# Patient Record
Sex: Female | Born: 1945
Health system: Southern US, Community
[De-identification: ages and names within clinical notes are randomized; demographics above are authoritative.]

## PROBLEM LIST (undated history)

## (undated) DIAGNOSIS — S2231XA Fracture of one rib, right side, initial encounter for closed fracture: Secondary | ICD-10-CM

## (undated) DIAGNOSIS — I509 Heart failure, unspecified: Secondary | ICD-10-CM

## (undated) DIAGNOSIS — R112 Nausea with vomiting, unspecified: Secondary | ICD-10-CM

## (undated) DIAGNOSIS — T8859XA Other complications of anesthesia, initial encounter: Secondary | ICD-10-CM

## (undated) DIAGNOSIS — M1990 Unspecified osteoarthritis, unspecified site: Secondary | ICD-10-CM

## (undated) DIAGNOSIS — H359 Unspecified retinal disorder: Secondary | ICD-10-CM

## (undated) DIAGNOSIS — R058 Other specified cough: Secondary | ICD-10-CM

## (undated) DIAGNOSIS — I712 Thoracic aortic aneurysm, without rupture, unspecified: Secondary | ICD-10-CM

## (undated) DIAGNOSIS — M810 Age-related osteoporosis without current pathological fracture: Secondary | ICD-10-CM

## (undated) DIAGNOSIS — R05 Cough: Secondary | ICD-10-CM

## (undated) DIAGNOSIS — I5032 Chronic diastolic (congestive) heart failure: Secondary | ICD-10-CM

## (undated) DIAGNOSIS — J449 Chronic obstructive pulmonary disease, unspecified: Secondary | ICD-10-CM

## (undated) DIAGNOSIS — T4145XA Adverse effect of unspecified anesthetic, initial encounter: Secondary | ICD-10-CM

## (undated) DIAGNOSIS — H3530 Unspecified macular degeneration: Secondary | ICD-10-CM

## (undated) DIAGNOSIS — C801 Malignant (primary) neoplasm, unspecified: Secondary | ICD-10-CM

## (undated) DIAGNOSIS — M26609 Unspecified temporomandibular joint disorder, unspecified side: Secondary | ICD-10-CM

## (undated) DIAGNOSIS — I5189 Other ill-defined heart diseases: Secondary | ICD-10-CM

## (undated) DIAGNOSIS — I75029 Atheroembolism of unspecified lower extremity: Secondary | ICD-10-CM

## (undated) DIAGNOSIS — M797 Fibromyalgia: Secondary | ICD-10-CM

## (undated) DIAGNOSIS — J9610 Chronic respiratory failure, unspecified whether with hypoxia or hypercapnia: Secondary | ICD-10-CM

## (undated) DIAGNOSIS — Z9889 Other specified postprocedural states: Secondary | ICD-10-CM

## (undated) DIAGNOSIS — Z973 Presence of spectacles and contact lenses: Secondary | ICD-10-CM

## (undated) HISTORY — PX: KIDNEY STONE SURGERY: SHX686

## (undated) HISTORY — PX: EYE SURGERY: SHX253

## (undated) HISTORY — PX: CATARACT EXTRACTION, BILATERAL: SHX1313

## (undated) HISTORY — DX: Atheroembolism of unspecified lower extremity: I75.029

## (undated) HISTORY — PX: PARTIAL HYSTERECTOMY: SHX80

## (undated) HISTORY — PX: OTHER SURGICAL HISTORY: SHX169

## (undated) HISTORY — DX: Age-related osteoporosis without current pathological fracture: M81.0

---

## 2006-03-21 HISTORY — PX: ANGIOPLASTY: SHX39

## 2007-06-23 ENCOUNTER — Emergency Department (HOSPITAL_COMMUNITY): Admission: EM | Admit: 2007-06-23 | Discharge: 2007-06-23 | Payer: Self-pay | Admitting: Emergency Medicine

## 2007-09-03 ENCOUNTER — Encounter: Admission: RE | Admit: 2007-09-03 | Discharge: 2007-11-01 | Payer: Self-pay | Admitting: Orthopaedic Surgery

## 2011-01-19 ENCOUNTER — Ambulatory Visit (HOSPITAL_COMMUNITY)
Admission: RE | Admit: 2011-01-19 | Discharge: 2011-01-19 | Disposition: A | Discharge status: Home / Self Care | Payer: Medicare Other | Source: Ambulatory Visit | Attending: Ophthalmology | Admitting: Ophthalmology

## 2011-01-19 ENCOUNTER — Encounter (HOSPITAL_COMMUNITY): Payer: Self-pay

## 2011-01-19 ENCOUNTER — Encounter (HOSPITAL_COMMUNITY)
Admission: RE | Admit: 2011-01-19 | Discharge: 2011-01-19 | Disposition: A | Discharge status: Home / Self Care | Payer: Medicare Other | Source: Ambulatory Visit | Attending: Ophthalmology | Admitting: Ophthalmology

## 2011-01-19 DIAGNOSIS — Z01812 Encounter for preprocedural laboratory examination: Secondary | ICD-10-CM | POA: Insufficient documentation

## 2011-01-19 DIAGNOSIS — Z01810 Encounter for preprocedural cardiovascular examination: Secondary | ICD-10-CM | POA: Insufficient documentation

## 2011-01-19 DIAGNOSIS — Z01818 Encounter for other preprocedural examination: Secondary | ICD-10-CM | POA: Insufficient documentation

## 2011-01-19 HISTORY — DX: Chronic obstructive pulmonary disease, unspecified: J44.9

## 2011-01-19 HISTORY — DX: Presence of spectacles and contact lenses: Z97.3

## 2011-01-19 HISTORY — DX: Unspecified macular degeneration: H35.30

## 2011-01-19 HISTORY — DX: Unspecified retinal disorder: H35.9

## 2011-01-19 HISTORY — DX: Other specified cough: R05.8

## 2011-01-19 HISTORY — DX: Fibromyalgia: M79.7

## 2011-01-19 HISTORY — DX: Unspecified osteoarthritis, unspecified site: M19.90

## 2011-01-19 HISTORY — DX: Cough: R05

## 2011-01-19 LAB — CBC
HCT: 42.6 % (ref 36.0–46.0)
Hemoglobin: 14 g/dL (ref 12.0–15.0)
MCH: 29.5 pg (ref 26.0–34.0)
MCHC: 32.9 g/dL (ref 30.0–36.0)
MCV: 89.9 fL (ref 78.0–100.0)
Platelets: 307 10*3/uL (ref 150–400)
RBC: 4.74 MIL/uL (ref 3.87–5.11)
RDW: 12.9 % (ref 11.5–15.5)
WBC: 9.9 10*3/uL (ref 4.0–10.5)

## 2011-01-19 LAB — BASIC METABOLIC PANEL
BUN: 7 mg/dL (ref 6–23)
CO2: 29 mEq/L (ref 19–32)
Calcium: 9.6 mg/dL (ref 8.4–10.5)
Chloride: 99 mEq/L (ref 96–112)
Creatinine, Ser: 0.61 mg/dL (ref 0.50–1.10)
GFR calc Af Amer: >90 mL/min (ref >90)
GFR calc non Af Amer: >90 mL/min (ref >90)
Glucose, Bld: 97 mg/dL (ref 70–99)
Potassium: 4.1 mEq/L (ref 3.5–5.1)
Sodium: 136 mEq/L (ref 135–145)

## 2011-01-19 LAB — SURGICAL PCR SCREEN
MRSA, PCR: NEGATIVE
Staphylococcus aureus: NEGATIVE

## 2011-01-19 NOTE — Pre-Procedure Instructions (Addendum)
19 Tina Bailey  01/19/2011   Your procedure is scheduled on:  January 26, 2011  Report to [facility] at 6:30 AM.  Call this number if you have problems the morning of surgery: [phone]   Remember:   Do not eat food:After Midnight.  Do not drink clear liquids: 4 Hours before arrival.  Take these medicines the morning of surgery with A SIP OF WATER: none   Do not wear jewelry, make-up or nail polish.  Do not wear lotions, powders, or perfumes. You may wear deodorant.  Do not shave 48 hours prior to surgery.  Do not bring valuables to the hospital.  Contacts, dentures or bridgework may not be worn into surgery.  Leave suitcase in the car. After surgery it may be brought to your room.  For patients admitted to the hospital, checkout time is 11:00 AM the day of discharge.   Patients discharged the day of surgery will not be allowed to drive home.  Name and phone number of your driver: Mardi Mainland [phone]  Special Instructions: CHG Shower Use Special Wash: 1/2 bottle night before surgery and 1/2 bottle morning of surgery.   Please read over the following fact sheets that you were given: Pain Booklet, Coughing and Deep Breathing and Surgical Site Infection Prevention

## 2011-01-26 ENCOUNTER — Encounter (HOSPITAL_COMMUNITY)
Admission: RE | Disposition: A | Discharge status: Home / Self Care | Payer: Self-pay | Source: Ambulatory Visit | Attending: Ophthalmology

## 2011-01-26 ENCOUNTER — Ambulatory Visit (HOSPITAL_COMMUNITY): Payer: Medicare Other | Admitting: Anesthesiology

## 2011-01-26 ENCOUNTER — Encounter (HOSPITAL_COMMUNITY): Payer: Self-pay | Admitting: Anesthesiology

## 2011-01-26 ENCOUNTER — Encounter (HOSPITAL_COMMUNITY): Payer: Self-pay | Admitting: *Deleted

## 2011-01-26 ENCOUNTER — Ambulatory Visit (HOSPITAL_COMMUNITY)
Admission: RE | Admit: 2011-01-26 | Discharge: 2011-01-26 | Disposition: A | Discharge status: Home / Self Care | Payer: Medicare Other | Source: Ambulatory Visit | Attending: Ophthalmology | Admitting: Ophthalmology

## 2011-01-26 DIAGNOSIS — Z5309 Procedure and treatment not carried out because of other contraindication: Secondary | ICD-10-CM | POA: Insufficient documentation

## 2011-01-26 DIAGNOSIS — H18519 Endothelial corneal dystrophy, unspecified eye: Secondary | ICD-10-CM | POA: Insufficient documentation

## 2011-01-26 SURGERY — TRANSPLANT, CORNEA
Anesthesia: General | Laterality: Right

## 2011-01-26 MED ORDER — TRYPAN BLUE 0.15 % OP SOLN
Freq: Once | OPHTHALMIC | Status: DC
  Filled 2011-01-26: qty 0.5

## 2011-01-26 MED ORDER — METOCLOPRAMIDE HCL 5 MG/ML IJ SOLN
10.0000 mg | Freq: Once | INTRAMUSCULAR | Status: DC | PRN
  Filled 2011-01-26: qty 2

## 2011-01-26 MED ORDER — GLYCOPYRROLATE 0.2 MG/ML IJ SOLN
INTRAMUSCULAR | Status: DC | PRN
  Administered 2011-01-26: .4 mg via INTRAVENOUS

## 2011-01-26 MED ORDER — DEXAMETHASONE SODIUM PHOSPHATE 4 MG/ML IJ SOLN
INTRAMUSCULAR | Status: DC | PRN
  Administered 2011-01-26: 8 mg via INTRAVENOUS

## 2011-01-26 MED ORDER — SODIUM CHLORIDE 0.9 % IV SOLN
INTRAVENOUS | Status: DC | PRN
  Administered 2011-01-26: 13:00:00 via INTRAVENOUS

## 2011-01-26 MED ORDER — LIDOCAINE-PRILOCAINE 2.5-2.5 % EX CREA: 1.0000 "application " | TOPICAL_CREAM | Freq: Once | CUTANEOUS | Status: DC

## 2011-01-26 MED ORDER — GLYCOPYRROLATE 0.2 MG/ML IJ SOLN
0.2000 mg | Freq: Once | INTRAMUSCULAR | Status: DC | PRN
  Filled 2011-01-26: qty 1

## 2011-01-26 MED ORDER — FENTANYL CITRATE 0.05 MG/ML IJ SOLN: 25.0000 ug | INTRAMUSCULAR | Status: DC | PRN

## 2011-01-26 MED ORDER — ACETAMINOPHEN 80 MG RE SUPP
20.0000 mg/kg | RECTAL | Status: DC | PRN
  Filled 2011-01-26: qty 15

## 2011-01-26 MED ORDER — METOCLOPRAMIDE HCL 5 MG/ML IJ SOLN
INTRAMUSCULAR | Status: DC | PRN
  Administered 2011-01-26: 5 mg via INTRAVENOUS

## 2011-01-26 MED ORDER — SODIUM CHLORIDE 0.9 % IV SOLN
INTRAVENOUS | Status: DC
  Administered 2011-01-26: 13:00:00 via INTRAVENOUS

## 2011-01-26 MED ORDER — LIDOCAINE HCL (CARDIAC) 20 MG/ML IV SOLN
INTRAVENOUS | Status: DC | PRN
  Administered 2011-01-26: 60 mg via INTRAVENOUS

## 2011-01-26 MED ORDER — MIDAZOLAM HCL 2 MG/2ML IJ SOLN: 0.5000 mg | INTRAMUSCULAR | Status: DC | PRN

## 2011-01-26 MED ORDER — ROCURONIUM BROMIDE 100 MG/10ML IV SOLN
INTRAVENOUS | Status: DC | PRN
  Administered 2011-01-26: 40 mg via INTRAVENOUS

## 2011-01-26 MED ORDER — MIDAZOLAM HCL 2 MG/2ML IJ SOLN: 1.0000 mg | INTRAMUSCULAR | Status: DC | PRN

## 2011-01-26 MED ORDER — LACTATED RINGERS IV SOLN: 500.0000 mL | INTRAVENOUS | Status: DC

## 2011-01-26 MED ORDER — ATROPINE SULFATE 0.4 MG/ML IJ SOLN
0.4000 mg | Freq: Once | INTRAMUSCULAR | Status: DC | PRN
  Filled 2011-01-26: qty 1

## 2011-01-26 MED ORDER — PHENYLEPHRINE HCL 10 % OP SOLN
1.0000 [drp] | Freq: Once | OPHTHALMIC | Status: DC
  Filled 2011-01-26: qty 5

## 2011-01-26 MED ORDER — MORPHINE SULFATE 2 MG/ML IJ SOLN: 0.0500 mg/kg | INTRAMUSCULAR | Status: DC | PRN

## 2011-01-26 MED ORDER — ONDANSETRON HCL 4 MG/2ML IJ SOLN
INTRAMUSCULAR | Status: DC | PRN
  Administered 2011-01-26 (×2): 4 mg via INTRAVENOUS

## 2011-01-26 MED ORDER — EPHEDRINE SULFATE 50 MG/ML IJ SOLN
INTRAMUSCULAR | Status: DC | PRN
  Administered 2011-01-26 (×4): 5 mg via INTRAVENOUS

## 2011-01-26 MED ORDER — LACTATED RINGERS IV SOLN: INTRAVENOUS | Status: DC

## 2011-01-26 MED ORDER — LACTATED RINGERS IV SOLN
INTRAVENOUS | Status: DC | PRN
  Administered 2011-01-26: 14:00:00 via INTRAVENOUS

## 2011-01-26 MED ORDER — TROPICAMIDE 1 % OP SOLN
1.0000 [drp] | Freq: Once | OPHTHALMIC | Status: DC
  Filled 2011-01-26: qty 2

## 2011-01-26 MED ORDER — KETOROLAC TROMETHAMINE 30 MG/ML IJ SOLN: 15.0000 mg | Freq: Once | INTRAMUSCULAR | Status: DC | PRN

## 2011-01-26 MED ORDER — FENTANYL CITRATE 0.05 MG/ML IJ SOLN
INTRAMUSCULAR | Status: DC | PRN
  Administered 2011-01-26 (×2): 50 ug via INTRAVENOUS

## 2011-01-26 MED ORDER — PROPOFOL 10 MG/ML IV EMUL
INTRAVENOUS | Status: DC | PRN
  Administered 2011-01-26: 180 mg via INTRAVENOUS

## 2011-01-26 MED ORDER — NEOSTIGMINE METHYLSULFATE 1 MG/ML IJ SOLN
INTRAMUSCULAR | Status: DC | PRN
  Administered 2011-01-26: 2 mg via INTRAVENOUS

## 2011-01-26 MED ORDER — MIDAZOLAM HCL 5 MG/5ML IJ SOLN
INTRAMUSCULAR | Status: DC | PRN
  Administered 2011-01-26: 2 mg via INTRAVENOUS

## 2011-01-26 MED ORDER — FENTANYL CITRATE 0.05 MG/ML IJ SOLN: 50.0000 ug | INTRAMUSCULAR | Status: DC | PRN

## 2011-01-26 MED ORDER — SODIUM CHLORIDE 0.9 % IV SOLN
0.1000 mg/kg | Freq: Once | INTRAVENOUS | Status: DC | PRN
  Filled 2011-01-26: qty 3

## 2011-01-26 MED ORDER — ACETAMINOPHEN 100 MG/ML PO SOLN
15.0000 mg/kg | ORAL | Status: DC | PRN
  Filled 2011-01-26: qty 15

## 2011-01-26 MED ORDER — PHENYLEPHRINE HCL 10 MG/ML IJ SOLN
INTRAMUSCULAR | Status: DC | PRN
  Administered 2011-01-26: 80 ug via INTRAVENOUS
  Administered 2011-01-26 (×2): 40 ug via INTRAVENOUS

## 2011-01-26 MED ORDER — OXYMETAZOLINE HCL 0.05 % NA SOLN: 2.0000 | Freq: Once | NASAL | Status: DC

## 2011-01-26 SURGICAL SUPPLY — 29 items
APPLICATOR COTTON TIP 6IN STRL (MISCELLANEOUS) ×2 IMPLANT
CLOTH BEACON ORANGE TIMEOUT ST (SAFETY) ×2 IMPLANT
CONT SPEC STER OR (MISCELLANEOUS) ×2 IMPLANT
CORDS BIPOLAR (ELECTRODE) ×2 IMPLANT
DRAPE OPHTHALMIC 77X100 STRL (CUSTOM PROCEDURE TRAY) ×2 IMPLANT
DRAPE PROXIMA HALF (DRAPES) ×2 IMPLANT
ERASER HMR WETFIELD 23G BP (MISCELLANEOUS) IMPLANT
GLOVE ECLIPSE 7.5 STRL STRAW (GLOVE) ×4 IMPLANT
GLOVE SS BIOGEL STRL SZ 7 (GLOVE) ×1 IMPLANT
GLOVE SUPERSENSE BIOGEL SZ 7 (GLOVE) ×1
GOWN STRL NON-REIN LRG LVL3 (GOWN DISPOSABLE) ×10 IMPLANT
HUMAN CORNEA (Ophthalmic Related) IMPLANT
KIT ROOM TURNOVER OR (KITS) ×2 IMPLANT
MARKER SKIN DUAL TIP RULER LAB (MISCELLANEOUS) ×2 IMPLANT
NEEDLE HYPO 23GX1 LL BLUE HUB (NEEDLE) ×6 IMPLANT
NS IRRIG 1000ML POUR BTL (IV SOLUTION) ×2 IMPLANT
PACK VITRECTOMY CUSTOM (CUSTOM PROCEDURE TRAY) ×2 IMPLANT
PACK VITRECTOMY PIC MCHSVP (PACKS) IMPLANT
PAD ARMBOARD 7.5X6 YLW CONV (MISCELLANEOUS) ×2 IMPLANT
ROLLS DENTAL (MISCELLANEOUS) IMPLANT
SPEAR EYE SURG WECK-CEL (MISCELLANEOUS) IMPLANT
STOCKINETTE IMPERVIOUS 9X36 MD (GAUZE/BANDAGES/DRESSINGS) ×4 IMPLANT
SUT ETHILON 10 0 CS140 6 (SUTURE) IMPLANT
SUT SILK 2 0 TIES 17X18 (SUTURE)
SUT SILK 2-0 18XBRD TIE BLK (SUTURE) IMPLANT
SUT VICRYL 8 0 TG140 8 (SUTURE) IMPLANT
SWAB COLLECTION DEVICE MRSA (MISCELLANEOUS) ×2 IMPLANT
TOWEL OR 17X24 6PK STRL BLUE (TOWEL DISPOSABLE) ×4 IMPLANT
TUBE ANAEROBIC SPECIMEN COL (MISCELLANEOUS) ×2 IMPLANT

## 2011-01-26 NOTE — Preoperative (Signed)
Beta Blockers   Reason not to administer Beta Blockers:Not Applicable 

## 2011-01-26 NOTE — Anesthesia Preprocedure Evaluation (Addendum)
Anesthesia Evaluation  Patient identified by MRN, date of birth, ID band Patient awake    Reviewed: Allergy & Precautions, H&P , NPO status , Patient's Chart, lab work & pertinent test results, reviewed documented beta blocker date and time   Airway Mallampati: II TM Distance: >3 FB Neck ROM: full    Dental  (+) Upper Dentures and Dental Advisory Given   Pulmonary COPD COPD inhaler,    Pulmonary exam normal       Cardiovascular neg cardio ROS Regular Normal    Neuro/Psych  Neuromuscular disease Negative Psych ROS   GI/Hepatic negative GI ROS, Neg liver ROS,   Endo/Other  Negative Endocrine ROS  Renal/GU negative Renal ROS  Genitourinary negative   Musculoskeletal   Abdominal Normal abdominal exam  (+)   Peds  Hematology negative hematology ROS (+)   Anesthesia Other Findings See surgeon's H&P   Reproductive/Obstetrics negative OB ROS                          Anesthesia Physical Anesthesia Plan  ASA: III  Anesthesia Plan: General   Post-op Pain Management:    Induction: Intravenous  Airway Management Planned: Oral ETT  Additional Equipment:   Intra-op Plan:   Post-operative Plan: Extubation in OR  Informed Consent: I have reviewed the patients History and Physical, chart, labs and discussed the procedure including the risks, benefits and alternatives for the proposed anesthesia with the patient or authorized representative who has indicated his/her understanding and acceptance.   Dental advisory given  Plan Discussed with: CRNA and Surgeon  Anesthesia Plan Comments:       Anesthesia Quick Evaluation

## 2011-01-26 NOTE — Anesthesia Procedure Notes (Addendum)
Narrative

## 2011-01-26 NOTE — Op Note (Signed)
OPERATIVE NOTE:  Preoperative diagnosis: Fuchs' endothelial corneal dystrophy  Postoperative diagnoses: Fuchs' endothelial corneal dystrophy  Planned operation:  Descemet stripping endothelial keratoplasty, right eye  Surgeon:   Shayne Alken. Redmond Baseman, M.D.  Anesthesia:   General anesthesia  "  Material forwarded to lab: Donor corneal remnant tissue for aerobic and anaerobic cultures  Indications for operation: The patient had increasing difficulty with daily activities due to the decreased vision in the operative eye. Examination revealed Fuchs' corneal dystrophy with corneal edema in the right eye that appeared to be the main etiology of her symptoms. The risks, benefits and alternatives to Descemet stripping endothelial keratoplasty in the right eye were discussed with the patient in detail, including the risk of general anesthesia, infection, graft rejection, primary graft failure, failure to improve vision, need to return to operating room for re-floating the corneal graft, and the possibility of other less common unforeseen potential complications. The patient understood and chose to proceed with the planned surgery. Informed operative consent was obtained.  Description of operation: The appropriate surgical site was marked by the surgeon in the preoperative area. All patient's questions were answered in the preoperative area and informed consent was given. A [place] balloon cuff was applied over the right eye for 30 minutes preoperatively. The patient was then taken to the operating room and placed supine on the operating table. The patient was placed under general anesthesia by the anesthesia provider. A timeout check was performed by the surgical team and it was determined that all required surgical instrumentation was not present. All required surgical instrumentation had been previously listed, available and used at this facility for the planned procedure, but was unable to be located by the  operating room staff to proceed with this case at this time in a safe and usual manner. The surgery was therefore canceled.  No surgical contact with the patient by the surgeon occurred. The patient was awakened from anesthesia by the anesthesia provider and transferred to the recovery area in stable condition and having suffered no complications.  The events and reason for cancellation will be discussed with the patient in the recovery area and the patient's family in the waiting area. The patient was discharged in satisfactory condition.  Signs: Earl Lites L. Gwenlyn Perking.D.

## 2011-01-26 NOTE — H&P (Signed)
  NOTE:  History and physical examination note is in the performed in the chart and has not been scanned into the system at this time. Please make reference to the paper note in the chart. I have examined the patient today, and reviewed her history and physical examination. There are no significant changes to her findings or the treatment plan.  Plan to proceed with Descemet's stripping endothelial keratoplasty, right eye.  Signed: Shayne Alken. Redmond Baseman M.D 01/26/2011

## 2011-01-26 NOTE — Anesthesia Postprocedure Evaluation (Signed)
  Anesthesia Post-op Note  Patient: Tina Bailey  Procedure(s) Performed:  KERATOPLASTY CORNEAL TRANSPLANT - descemit's stripping with endothelial keratoplasty  Patient Location: PACU  Anesthesia Type: MAC  Level of Consciousness: awake  Airway and Oxygen Therapy: Patient Spontanous Breathing  Post-op Pain: none  Post-op Assessment: Post-op Vital signs reviewed and Patient's Cardiovascular Status Stable  Post-op Vital Signs: stable  Complications: No apparent anesthesia complications

## 2011-01-26 NOTE — Transfer of Care (Signed)
Immediate Anesthesia Transfer of Care Note  Patient: Tina Bailey  Procedure(s) Performed:  KERATOPLASTY CORNEAL TRANSPLANT - descemit's stripping with endothelial keratoplasty  Patient Location: PACU  Anesthesia Type: General  Level of Consciousness: sedated  Airway & Oxygen Therapy: Patient Spontanous Breathing and Patient connected to nasal cannula oxygen  Post-op Assessment: Report given to PACU RN, Post -op Vital signs reviewed and stable and Patient moving all extremities  Post vital signs: Reviewed and stable  Complications: No apparent anesthesia complications

## 2011-01-26 NOTE — OR Nursing (Signed)
Case cancelled by Dr. Redmond Baseman due to missing instrumentation and unable to do the case without specified instruments.

## 2012-09-20 ENCOUNTER — Ambulatory Visit (INDEPENDENT_AMBULATORY_CARE_PROVIDER_SITE_OTHER): Payer: Medicare Other | Admitting: Family Medicine

## 2012-09-20 ENCOUNTER — Telehealth: Payer: Self-pay | Admitting: Family Medicine

## 2012-09-20 ENCOUNTER — Encounter: Payer: Self-pay | Admitting: Family Medicine

## 2012-09-20 VITALS — BP 104/68 | HR 79 | Temp 98.1°F | Ht 65.0 in | Wt 134.8 lb

## 2012-09-20 DIAGNOSIS — J069 Acute upper respiratory infection, unspecified: Secondary | ICD-10-CM

## 2012-09-20 MED ORDER — AMOXICILLIN 875 MG PO TABS: 875.0000 mg | ORAL_TABLET | Freq: Two times a day (BID) | ORAL | Status: DC

## 2012-09-20 NOTE — Progress Notes (Signed)
  Subjective:    Patient ID: Tina Bailey, female    DOB: July 18, 1945, 67 y.o.   MRN: [id number]  HPI This 67 y.o. female presents for evaluation of myalgias and discomfort in her left jaw and ear.  She has hx of TMJ and FMS she states.  She has been having some congestion and URI sx's with associated arthralgias and myalgias.  She is a smoker.  She has had cardiac w/u 3 years ago and cardiac cath which was normal she states.   Review of Systems C/o neck and jaw discomfort, URI sx's, and fatigue.  No chest pain, SOB, HA, dizziness, vision change, N/V, diarrhea, constipation, dysuria, urinary urgency or frequency, myalgias, arthralgias or rash.     Objective:   Physical Exam Vital signs noted  Well developed well nourished female.  HEENT - Head atraumatic Normocephalic                Eyes - PERRLA, Conjuctiva - clear Sclera- Clear EOMI                Ears - EAC's Wnl TM's Wnl Gross Hearing WNL.                 Nose - Nares patent                 Throat - oropharanx wnl                 Neck TTP left sternocleidomastoid muscle.  TTP left jaw and TMJ region. Respiratory - Lungs CTA bilateral Cardiac - RRR S1 and S2 without murmur GI - Abdomen soft Nontender and bowel sounds active x 4 Extremities - No edema. Neuro - Grossly intact.       Assessment & Plan:  Acute upper respiratory infections of unspecified site - Plan: amoxicillin (AMOXIL) 875 MG tablet Tylenol and motrin otc as directed.  Discussed using tonic water prn myalgias.

## 2012-09-26 ENCOUNTER — Telehealth: Payer: Self-pay | Admitting: Family Medicine

## 2012-09-27 NOTE — Telephone Encounter (Signed)
Tylenol and motrin otc as directed for sore throat, Warm salt water gargles prn sore throat, if not feeling better please feel free to follow up.

## 2012-09-27 NOTE — Telephone Encounter (Signed)
Patient only wants to speak with you

## 2012-09-28 ENCOUNTER — Telehealth: Payer: Self-pay | Admitting: Family Medicine

## 2012-09-28 ENCOUNTER — Ambulatory Visit (INDEPENDENT_AMBULATORY_CARE_PROVIDER_SITE_OTHER): Payer: Medicare Other | Admitting: Family Medicine

## 2012-09-28 ENCOUNTER — Encounter: Payer: Self-pay | Admitting: Family Medicine

## 2012-09-28 VITALS — BP 123/72 | HR 80 | Temp 98.8°F

## 2012-09-28 DIAGNOSIS — J329 Chronic sinusitis, unspecified: Secondary | ICD-10-CM

## 2012-09-28 MED ORDER — METHYLPREDNISOLONE (PAK) 4 MG PO TABS
ORAL_TABLET | ORAL | Status: DC
Start: 2012-09-28 — End: 2012-10-09

## 2012-09-28 MED ORDER — AMOXICILLIN-POT CLAVULANATE 875-125 MG PO TABS: 1.0000 | ORAL_TABLET | Freq: Two times a day (BID) | ORAL | Status: DC

## 2012-09-28 NOTE — Patient Instructions (Signed)

## 2012-09-28 NOTE — Telephone Encounter (Signed)
Will not speak to anyone but you very upset please call. Called Tina Bailey last night on call she promised her she would get you to call her today.

## 2012-09-28 NOTE — Telephone Encounter (Signed)
Appt given for today 

## 2012-09-28 NOTE — Progress Notes (Signed)
  Subjective:    Patient ID: Tina Bailey, female    DOB: [birth year], 67 y.o.   MRN: [id number]  HPI  This 67 y.o. female presents for evaluation of sinus congestion, facial discomfort, neck pain and pain and headaches. She is having some fatigue and weakness.  She notices she is more tired and out of breath when she is getting the mail Or walking. She is having sore throat.  She is feeling bad.  She is having jaw pain when using her muscles when she eats. She gets sore throat.  Review of Systems Neck pain, Headaches, sore throat, and URI sx's. No chest pain, SOB, dizziness, vision change, N/V, diarrhea, constipation, dysuria, urinary urgency or frequency,  or rash.     Objective:   Physical Exam  Vital signs noted  Well developed well nourished female.  HEENT - Head atraumatic Normocephalic                Eyes - PERRLA, Conjuctiva - clear Sclera- Clear EOMI                Ears - EAC's Wnl TM's Wnl Gross Hearing WNL                Nose - Nares patent                 Throat - oropharanx wnl Respiratory - Lungs CTA bilateral Cardiac - RRR S1 and S2 without murmur GI - Abdomen soft Nontender and bowel sounds active x 4 Extremities - No edema. Neuro - Grossly intact.      Assessment & Plan:  Unspecified sinusitis (chronic) Medrol dose pack as directed, Augmentin 875mg  po bid x 10 days, Discussed she needs to follow if not better. Discussed with patient that she can take mucinex otc. Discussed with patient that she needs to quit smoking.   Spent over 30 minutes with patient in visit discussing tx modalities and medicine.

## 2012-09-28 NOTE — Telephone Encounter (Signed)
Patient was called and told to follow up today and was transferred up front for scheduling.

## 2012-10-08 ENCOUNTER — Telehealth: Payer: Self-pay | Admitting: Family Medicine

## 2012-10-09 ENCOUNTER — Ambulatory Visit (INDEPENDENT_AMBULATORY_CARE_PROVIDER_SITE_OTHER): Payer: Medicare Other | Admitting: Family Medicine

## 2012-10-09 ENCOUNTER — Encounter: Payer: Self-pay | Admitting: Family Medicine

## 2012-10-09 VITALS — BP 103/72 | HR 81 | Temp 97.5°F | Ht 65.0 in | Wt 131.0 lb

## 2012-10-09 DIAGNOSIS — J029 Acute pharyngitis, unspecified: Secondary | ICD-10-CM

## 2012-10-09 NOTE — Progress Notes (Signed)
  Subjective:    Patient ID: Tina Bailey, female    DOB: 12-30-1945, 67 y.o.   MRN: [id number]  HPI  URI Symptoms Onset: 6-8 weeks  Description: throat pain, trouble swallowing, sinus pressure  Modifying factors:  1-1/2 PPD smoker. Was seen for similar sxs x 2. Has been placed on amox and augmentin as well as glucocorticoids with no improvement in sxs.   Symptoms Nasal discharge: minimal  Fever: no Sore throat: yes Cough: no Wheezing: no Ear pain: no GI symptoms: no Sick contacts: no  Red Flags  Stiff neck: no Dyspnea: no Rash: no Swallowing difficulty: no  Sinusitis Risk Factors Headache/face pain: intermittent  Double sickening: no tooth pain: no   Allergy Risk Factors Sneezing: no Itchy scratchy throat: no Seasonal symptoms: no  Flu Risk Factors Headache: no muscle aches: no severe fatigue: no  Has had intermittent feelingof throat swelling.       Review of Systems  All other systems reviewed and are negative.       Objective:   Physical Exam  Constitutional: She appears well-developed and well-nourished.  HENT:  Head: Normocephalic and atraumatic.  + post oropharyngeal erthema Mild cervical LAD    Eyes: Conjunctivae are normal. Pupils are equal, round, and reactive to light.  Neck: Normal range of motion. Neck supple.  No discernible thyromegaly Minimal cervical LAD    Cardiovascular: Normal rate and regular rhythm.   Pulmonary/Chest: Effort normal and breath sounds normal.  Abdominal: Soft.  Musculoskeletal: Normal range of motion.  Neurological: She is alert.  Skin: Skin is warm.          Assessment & Plan:  Sore throat - Plan: Ambulatory referral to ENT, CANCELED: POCT CBC  Pharyngitis  Given duration of sxs despite appropriate treatment in setting of heavy somking history, will refer pt to ENT for further evaluation to r/o other sources of sxs.  Discussed smoking cessation at length.  No signs of airway compromise.

## 2013-06-26 ENCOUNTER — Other Ambulatory Visit: Payer: Self-pay | Admitting: Family Medicine

## 2013-06-26 DIAGNOSIS — M545 Low back pain, unspecified: Secondary | ICD-10-CM

## 2013-07-03 ENCOUNTER — Ambulatory Visit
Admission: RE | Admit: 2013-07-03 | Discharge: 2013-07-03 | Disposition: A | Discharge status: Home / Self Care | Payer: Medicare Other | Source: Ambulatory Visit | Attending: Family Medicine | Admitting: Family Medicine

## 2013-07-03 DIAGNOSIS — M545 Low back pain, unspecified: Secondary | ICD-10-CM

## 2013-08-07 ENCOUNTER — Emergency Department (HOSPITAL_COMMUNITY): Payer: Medicare Other

## 2013-08-07 ENCOUNTER — Encounter (HOSPITAL_COMMUNITY): Payer: Self-pay | Admitting: Emergency Medicine

## 2013-08-07 ENCOUNTER — Emergency Department (HOSPITAL_COMMUNITY)
Admission: EM | Admit: 2013-08-07 | Discharge: 2013-08-07 | Disposition: A | Discharge status: Home / Self Care | Payer: Medicare Other | Attending: Emergency Medicine | Admitting: Emergency Medicine

## 2013-08-07 DIAGNOSIS — G8929 Other chronic pain: Secondary | ICD-10-CM | POA: Insufficient documentation

## 2013-08-07 DIAGNOSIS — F17200 Nicotine dependence, unspecified, uncomplicated: Secondary | ICD-10-CM | POA: Insufficient documentation

## 2013-08-07 DIAGNOSIS — Z79899 Other long term (current) drug therapy: Secondary | ICD-10-CM | POA: Insufficient documentation

## 2013-08-07 DIAGNOSIS — S32040A Wedge compression fracture of fourth lumbar vertebra, initial encounter for closed fracture: Secondary | ICD-10-CM

## 2013-08-07 DIAGNOSIS — J4489 Other specified chronic obstructive pulmonary disease: Secondary | ICD-10-CM | POA: Insufficient documentation

## 2013-08-07 DIAGNOSIS — Z8739 Personal history of other diseases of the musculoskeletal system and connective tissue: Secondary | ICD-10-CM | POA: Insufficient documentation

## 2013-08-07 DIAGNOSIS — J449 Chronic obstructive pulmonary disease, unspecified: Secondary | ICD-10-CM | POA: Insufficient documentation

## 2013-08-07 DIAGNOSIS — Z8669 Personal history of other diseases of the nervous system and sense organs: Secondary | ICD-10-CM | POA: Insufficient documentation

## 2013-08-07 DIAGNOSIS — IMO0002 Reserved for concepts with insufficient information to code with codable children: Secondary | ICD-10-CM | POA: Insufficient documentation

## 2013-08-07 NOTE — Discharge Instructions (Signed)
Please follow up with Dr. Lynann Bologna for further management of your back compression fracture.  You may benefit from kyphoplasty/vertebroplasty to aid with comfort.     Back, Compression Fracture A compression fracture happens when a force is put upon the length of your spine. Slipping and falling on your bottom are examples of such a force. When this happens, sometimes the force is great enough to compress the building blocks (vertebral bodies) of your spine. Although this causes a lot of pain, this can usually be treated at home, unless your caregiver feels hospitalization is needed for pain control. Your backbone (spinal column) is made up of 24 main vertebral bodies in addition to the sacrum and coccyx (see illustration). These are held together by tough fibrous tissues (ligaments) and by support of your muscles. Nerve roots pass through the openings between the vertebrae. A sudden wrenching move, injury, or a fall may cause a compression fracture of one of the vertebral bodies. This may result in back pain or spread of pain into the belly (abdomen), the buttocks, and down the leg into the foot. Pain may also be created by muscle spasm alone. Large studies have been undertaken to determine the best possible course of action to help your back following injury and also to prevent future problems. The recommendations are as follows. FOLLOWING A COMPRESSION FRACTURE: Do the following only if advised by your caregiver.   If a back brace has been suggested or provided, wear it as directed.  DO NOT stop wearing the back brace unless instructed by your caregiver.  When allowed to return to regular activities, avoid a sedentary life style. Actively exercise. Sporadic weekend binges of tennis, racquetball, water skiing, may actually aggravate or create problems, especially if you are not in condition for that activity.  Avoid sports requiring sudden body movements until you are in condition for them.  Swimming and walking are safer activities.  Maintain good posture.  Avoid obesity.  If not already done, you should have a DEXA scan. Based on the results, be treated for osteoporosis. FOLLOWING ACUTE (SUDDEN) INJURY:  Only take over-the-counter or prescription medicines for pain, discomfort, or fever as directed by your caregiver.  Use bed rest for only the most extreme acute episode. Prolonged bed rest may aggravate your condition. Ice used for acute conditions is effective. Use a large plastic bag filled with ice. Wrap it in a towel. This also provides excellent pain relief. This may be continuous. Or use it for 30 minutes every 2 hours during acute phase, then as needed. Heat for 30 minutes prior to activities is helpful.  As soon as the acute phase (the time when your back is too painful for you to do normal activities) is over, it is important to resume normal activities and work [employer] information centre manager. Back injuries can cause potentially marked changes in lifestyle. So it is important to attack these problems aggressively.  See your caregiver for continued problems. He or she can help or refer you for appropriate exercises, physical therapy and work hardening if needed.  If you are given narcotic medications for your condition, for the next 24 hours DO NOT:  Drive  Operate machinery or power tools.  Sign legal documents.  DO NOT drink alcohol, take sleeping pills or other medications that may interfere with treatment. If your caregiver has given you a follow-up appointment, it is very important to keep that appointment. Not keeping the appointment could result in a chronic or permanent injury, pain, and  disability. If there is any problem keeping the appointment, you must call back to this facility for assistance.  SEEK IMMEDIATE MEDICAL CARE IF:  You develop numbness, tingling, weakness, or problems with the use of your arms or legs.  You develop severe back pain not relieved with  medications.  You have changes in bowel or bladder control.  You have increasing pain in any areas of the body. Document Released: 03/07/2005 Document Revised: 05/30/2011 Document Reviewed: 10/10/2007 [facility] Patient Information 2014 [place].

## 2013-08-07 NOTE — ED Notes (Signed)
Pt had L3 acute fracture and it wearing a body brace since 07/13/13.    Pt states she has been off pain pills for 2 weeks.  Pt sat up in bed and started hurting in left hip and states that it hurts in hip area.  No injury.  Sitting hurts worse.

## 2013-08-07 NOTE — ED Notes (Signed)
Family wants patient and patient wants to be seen before taking pain medication because it knocks her out

## 2013-08-07 NOTE — ED Provider Notes (Signed)
CSN: [id number]     Arrival date & time 08/07/13  1243 History   First MD Initiated Contact with Patient 08/07/13 1400    This chart was scribed for Domenic Moras PA-C, a non-physician practitioner working with Carmin Muskrat, MD by Denice Bors, ED Scribe. This patient was seen in room TR05C/TR05C and the patient's care was started at 2:04 PM      Chief Complaint  Patient presents with  . Hip Pain     (Consider location/radiation/quality/duration/timing/severity/associated sxs/prior Treatment) The history is provided by the patient. No language interpreter was used.   HPI Comments: Tina Bailey is a 68 y.o. female who presents to the Emergency Department complaining of constant moderate low back pain onset chronic, but gradually worsening in severity since being taking off pain medications 2 weeks ago. Reports she is wearing back brace per recommendation. Describes pain as aching and radiating to bilateral hips.  Reports pain is exacerbated with movement. Denies associated fever, recent trauma, fall, abdominal pain, nausea, and emesis. Denies urinary or fecal incontinence, urinary retention, perineal/saddle paresthesias, fever, and PMHx of cancer. Reports PMHx of osteoporosis.   Reports thoracic fractures in December 2014, and dx in July 13, 2013 with Lumbar fxs confirmed with MRI.  Spine MD: Elder Love   Past Medical History  Diagnosis Date  . COPD (chronic obstructive pulmonary disease)   . Productive cough   . Wears glasses   . Arthritis     DDD  . Fibromyalgia   . Retina disorder     L eye, vision distorted, edema  . Macular degeneration     R eye   Past Surgical History  Procedure Laterality Date  . Partial hysterectomy    . Kidney stone surgery    . Angioplasty  2008    no stents required, no follow-up with cardiologist, no recurrent chest pain  . Eye surgery     Family History  Problem Relation Age of Onset  . Hypertension Mother   . Atrial  fibrillation Mother   . Stroke Father   . Dementia Father    History  Substance Use Topics  . Smoking status: Current Every Day Smoker -- 1.00 packs/day for 45 years    Types: Cigarettes  . Smokeless tobacco: Never Used  . Alcohol Use: No   OB History   Grav Para Term Preterm Abortions TAB SAB Ect Mult Living                 Review of Systems  Constitutional: Negative for fever.  Genitourinary: Negative.   Musculoskeletal: Positive for back pain.  Skin: Negative for rash.  Neurological: Negative.       Allergies  Paroxetine hcl and Sulfa antibiotics  Home Medications   Prior to Admission medications   Medication Sig Start Date End Date Taking? Authorizing Provider  Multiple Vitamins-Minerals (CENTRUM) tablet Take 1 tablet by mouth daily.    Historical Provider, MD   BP 151/87  Pulse 117  Temp(Src) 98.2 F (36.8 C) (Oral)  Resp 20  SpO2 96% Physical Exam  Nursing note and vitals reviewed. Constitutional: She is oriented to person, place, and time. She appears well-developed and well-nourished. No distress.  HENT:  Head: Normocephalic and atraumatic.  Eyes: EOM are normal.  Neck: Neck supple.  Cardiovascular: Normal rate.   Pulmonary/Chest: Effort normal. No respiratory distress.  Musculoskeletal: Normal range of motion.  TTP of lumbar spine and para-lumbar region. No step-offs or deformities.   No midline C-spine, or T-spine  with no step-offs or deformities noted    Bilateral hips are stable with hip log roll test   Neurological: She is alert and oriented to person, place, and time.  Skin: Skin is warm and dry.  Psychiatric: She has a normal mood and affect. Her behavior is normal.    ED Course  Procedures (including critical care time) COORDINATION OF CARE:  Nursing notes reviewed. Vital signs reviewed. Initial pt interview and examination performed.   Filed Vitals:   08/07/13 1254  BP: 151/87  Pulse: 117  Temp: 98.2 F (36.8 C)  TempSrc: Oral   Resp: 20  SpO2: 96%    2:04 PM-Discussed work up plan with pt at bedside, which includes xray to r/o new fracture.  Pt without red flags   4:00 PM Xray demonstrates compression fx at L3/L4 which is not new.  It is recommended that pt to have kyphoplasty to aid with pain.  She has pain medication and muscle relaxant at home to use, which i encourage.  Pt to f/u with her orthopedist Dr. Lynann Bologna.      Labs Review Labs Reviewed - No data to display  Imaging Review Dg Lumbar Spine Complete  08/07/2013   CLINICAL DATA:  Low back pain and history of prior compression deformities  EXAM: LUMBAR SPINE - COMPLETE 4+ VIEW  COMPARISON:  05/22/2013, 07/03/2013  FINDINGS: Five lumbar type vertebral bodies are well visualized. There are compression deformities of L3 and L4 similar to that seen on the prior exams. Mild osteophytic changes are seen. No pars defects are identified.  IMPRESSION: Compression deformities at L3 and L4. If patient has had significant pain despite adequate medical therapy, she may be a candidate for percutaneous kyphoplasty/vertebroplasty.   Electronically Signed   By: Inez Catalina M.D.   On: 08/07/2013 15:13   3:26 PM Nursing Notes Reviewed/ Care Coordinated Applicable Imaging Reviewed and incorporated into ED treatment Discussed results and treatment plan with pt. Pt demonstrates understanding and agrees with plan.    EKG Interpretation None      MDM   Final diagnoses:  Compression fracture of L4 lumbar vertebra    BP 111/77  Pulse 95  Temp(Src) 97.9 F (36.6 C) (Oral)  Resp 18  SpO2 96%  I have reviewed nursing notes and vital signs. I personally reviewed the imaging tests through PACS system  I reviewed available ER/hospitalization records thought the EMR   I personally performed the services described in this documentation, which was scribed in my presence. The recorded information has been reviewed and is accurate.     Domenic Moras, PA-C 08/07/13  [phone]

## 2013-08-10 NOTE — ED Provider Notes (Signed)
Medical screening examination/treatment/procedure(s) were performed by non-physician practitioner and as supervising physician I was immediately available for consultation/collaboration.   EKG Interpretation None        Tanna Furry, MD 08/10/13 [phone]

## 2014-04-03 ENCOUNTER — Encounter (HOSPITAL_COMMUNITY): Payer: Self-pay | Admitting: Ophthalmology

## 2014-11-26 ENCOUNTER — Other Ambulatory Visit: Payer: Self-pay

## 2014-11-26 DIAGNOSIS — I998 Other disorder of circulatory system: Secondary | ICD-10-CM

## 2014-11-27 ENCOUNTER — Encounter: Payer: Self-pay | Admitting: Vascular Surgery

## 2014-11-27 ENCOUNTER — Ambulatory Visit (INDEPENDENT_AMBULATORY_CARE_PROVIDER_SITE_OTHER): Payer: Medicare Other | Admitting: Vascular Surgery

## 2014-11-27 ENCOUNTER — Ambulatory Visit (HOSPITAL_COMMUNITY)
Admission: RE | Admit: 2014-11-27 | Discharge: 2014-11-27 | Disposition: A | Discharge status: Home / Self Care | Payer: Medicare Other | Source: Ambulatory Visit | Attending: Vascular Surgery | Admitting: Vascular Surgery

## 2014-11-27 ENCOUNTER — Other Ambulatory Visit: Payer: Self-pay | Admitting: Vascular Surgery

## 2014-11-27 VITALS — BP 137/58 | HR 83 | Temp 98.3°F | Resp 16 | Ht 65.5 in | Wt 114.0 lb

## 2014-11-27 DIAGNOSIS — L98499 Non-pressure chronic ulcer of skin of other sites with unspecified severity: Secondary | ICD-10-CM

## 2014-11-27 DIAGNOSIS — I70209 Unspecified atherosclerosis of native arteries of extremities, unspecified extremity: Secondary | ICD-10-CM

## 2014-11-27 DIAGNOSIS — I739 Peripheral vascular disease, unspecified: Secondary | ICD-10-CM

## 2014-11-27 DIAGNOSIS — I75022 Atheroembolism of left lower extremity: Secondary | ICD-10-CM | POA: Diagnosis not present

## 2014-11-27 DIAGNOSIS — I998 Other disorder of circulatory system: Secondary | ICD-10-CM | POA: Diagnosis not present

## 2014-11-27 DIAGNOSIS — I75029 Atheroembolism of unspecified lower extremity: Secondary | ICD-10-CM

## 2014-11-27 HISTORY — DX: Atheroembolism of unspecified lower extremity: I75.029

## 2014-11-27 LAB — POC BUN/CREATININE
BUN, IStat: 12 mg/dL (ref 8–26)
Creatinine, IStat: 0.6 mg/dL (ref 0.6–1.3)

## 2014-11-27 MED ORDER — ASPIRIN EC 81 MG PO TBEC: 81.0000 mg | DELAYED_RELEASE_TABLET | Freq: Every day | ORAL | Status: DC

## 2014-11-27 NOTE — Progress Notes (Signed)
[facility] HISTORY AND PHYSICAL   History of Present Illness:  Patient is a 69 y.o. year old female who presents for evaluation of pain and bluish discoloration left fifth toe. The patient began to have pain in her left fifth toe proximal me 3 months ago. Over the last week the toe has become purple in color. A hurts primarily in the morning time. She denies claudication symptoms. She denies any prior episodes. She smokes 1 pack of cigarettes per day. She was counseled for greater than 3 minutes today regarding this. She really has no intentions of quitting currently. She denies family history of abdominal aortic aneurysm. She denies history of diabetes..  Other medical problems include COPD, arthritis, coronary artery disease area all of these are currently stable.  Past Medical History  Diagnosis Date  . COPD (chronic obstructive pulmonary disease)   . Productive cough   . Wears glasses   . Arthritis     DDD  . Fibromyalgia   . Retina disorder     L eye, vision distorted, edema  . Macular degeneration     R eye    Past Surgical History  Procedure Laterality Date  . Partial hysterectomy    . Kidney stone surgery    . Angioplasty  2008    no stents required, no follow-up with cardiologist, no recurrent chest pain  . Eye surgery      Social History Social History  Substance Use Topics  . Smoking status: Current Every Day Smoker -- 1.00 packs/day for 45 years    Types: Cigarettes  . Smokeless tobacco: Never Used  . Alcohol Use: No    Family History Family History  Problem Relation Age of Onset  . Hypertension Mother   . Atrial fibrillation Mother   . Heart disease Mother     after age 16  . Stroke Father   . Dementia Father   . Heart disease Brother     After age 53- A-Fib  . Heart attack Brother     Allergies  Allergies  Allergen Reactions  . Paroxetine Hcl Rash  . Sulfa Antibiotics Rash     Current Outpatient Prescriptions   Medication Sig Dispense Refill  . cyclobenzaprine (FLEXERIL) 10 MG tablet Take 10 mg by mouth daily as needed for muscle spasms.    Marland Kitchen HYDROcodone-acetaminophen (NORCO/VICODIN) 5-325 MG per tablet Take 1 tablet by mouth every 6 (six) hours as needed for moderate pain.    . Multiple Vitamins-Minerals (CENTRUM) tablet Take 1 tablet by mouth daily.     No current facility-administered medications for this visit.    ROS:   General:  No weight loss, Fever, chills  HEENT: No recent headaches, no nasal bleeding, no visual changes, no sore throat  Neurologic: No dizziness, blackouts, seizures. No recent symptoms of stroke or mini- stroke. No recent episodes of slurred speech, or temporary blindness.  Cardiac: No recent episodes of chest pain/pressure, no shortness of breath at rest.  + shortness of breath with exertion.  Denies history of atrial fibrillation or irregular heartbeat  Vascular: No history of rest pain in feet.  No history of claudication.  No history of non-healing ulcer, No history of DVT   Pulmonary: No home oxygen, no productive cough, no hemoptysis,  No asthma or wheezing  Musculoskeletal:  [x ] Arthritis, [ ]  Low back pain,  [ ]  Joint pain  Hematologic:No history of hypercoagulable state.  No history of easy bleeding.  No history  of anemia  Gastrointestinal: No hematochezia or melena,  No gastroesophageal reflux, no trouble swallowing  Urinary: [ ]  chronic Kidney disease, [ ]  on HD - [ ]  MWF or [ ]  TTHS, [ ]  Burning with urination, [ ]  Frequent urination, [ ]  Difficulty urinating;   Skin: No rashes  Psychological: No history of anxiety,  No history of depression   Physical Examination  Filed Vitals:   11/27/14 1046  BP: 137/58  Pulse: 83  Temp: 98.3 F (36.8 C)  TempSrc: Oral  Resp: 16  Height: 5' 5.5" (1.664 m)  Weight: 114 lb (51.71 kg)  SpO2: 99%    Body mass index is 18.68 kg/(m^2).  General:  Alert and oriented, no acute distress HEENT:  Normal Neck: No bruit or JVD Pulmonary: Clear to auscultation bilaterally Cardiac: Regular Rate and Rhythm without murmur Abdomen: Soft, non-tender, non-distended, easily palpable aortic pulsation slightly widened potentially as large as 4 cm diameter Skin: No rash Extremity Pulses:  2+ radial, brachial, femoral, dorsalis pedis, posterior tibial pulses bilaterally Musculoskeletal: No deformity or edema, left fifth toe dusky in appearance from proximal phalanx out to the distal portion of the toe no ulceration  Neurologic: Upper and lower extremity motor 5/5 and symmetric  DATA:  Patient had bilateral ABIs performed today which were greater than 1 bilaterally with toe pressure 100 bilaterally triphasic waveforms on the right biphasic on the left   ASSESSMENT:  Atheroembolic event to the left fifth toe of unknown origin. Possible abdominal aortic aneurysm.   PLAN:  Patient will have a CT angiogram the abdomen and pelvis with bilateral lower extremity runoff to look for possible atheroembolic source. If this shows no possible source for an atheroembolic particle and we would consider scanning her chest as well. The patient will start take 1 aspirin daily. She will follow-up after her CT scan.  Ruta Hinds, MD [facility] Office: [phone] Pager: [phone]

## 2014-11-27 NOTE — Patient Instructions (Signed)
Please review the tobacco cessation information given to you today. It lists many hints that are useful in your effort to stop smoking. The [facility] contact phone # is [phone] These nurses and advisors offer lots of FREE information and aids to help you quit.    The [facility] line #  (450)376-2433, they will also assist you with programs designed to help you stop smoking.    Smoking Cessation, Tips for Success If you are ready to quit smoking, congratulations! You have chosen to help yourself be healthier. Cigarettes bring nicotine, tar, carbon monoxide, and other irritants into your body. Your lungs, heart, and blood vessels will be able to work better without these poisons. There are many different ways to quit smoking. Nicotine gum, nicotine patches, a nicotine inhaler, or nicotine nasal spray can help with physical craving. Hypnosis, support groups, and medicines help break the habit of smoking. WHAT THINGS CAN I DO TO MAKE QUITTING EASIER?  Here are some tips to help you quit for good:  Pick a date when you will quit smoking completely. Tell all of your friends and family about your plan to quit on that date.  Do not try to slowly cut down on the number of cigarettes you are smoking. Pick a quit date and quit smoking completely starting on that day.  Throw away all cigarettes.   Clean and remove all ashtrays from your home, work, and car.  On a card, write down your reasons for quitting. Carry the card with you and read it when you get the urge to smoke.  Cleanse your body of nicotine. Drink enough water and fluids to keep your urine clear or pale yellow. Do this after quitting to flush the nicotine from your body.  Learn to predict your moods. Do not let a bad situation be your excuse to have a cigarette. Some situations in your life might tempt you into wanting a cigarette.  Never have "just one" cigarette. It leads to wanting another and another. Remind  yourself of your decision to quit.  Change habits associated with smoking. If you smoked while driving or when feeling stressed, try other activities to replace smoking. Stand up when drinking your coffee. Brush your teeth after eating. Sit in a different chair when you read the paper. Avoid alcohol while trying to quit, and try to drink fewer caffeinated beverages. Alcohol and caffeine may urge you to smoke.  Avoid foods and drinks that can trigger a desire to smoke, such as sugary or spicy foods and alcohol.  Ask people who smoke not to smoke around you.  Have something planned to do right after eating or having a cup of coffee. For example, plan to take a walk or exercise.  Try a relaxation exercise to calm you down and decrease your stress. Remember, you may be tense and nervous for the first 2 weeks after you quit, but this will pass.  Find new activities to keep your hands busy. Play with a pen, coin, or rubber band. Doodle or draw things on paper.  Brush your teeth right after eating. This will help cut down on the craving for the taste of tobacco after meals. You can also try mouthwash.   Use oral substitutes in place of cigarettes. Try using lemon drops, carrots, cinnamon sticks, or chewing gum. Keep them handy so they are available when you have the urge to smoke.  When you have the urge to smoke, try deep breathing.  Designate  your home as a nonsmoking area.  If you are a heavy smoker, ask your health care provider about a prescription for nicotine chewing gum. It can ease your withdrawal from nicotine.  Reward yourself. Set aside the cigarette money you save and buy yourself something nice.  Look for support from others. Join a support group or smoking cessation program. Ask someone at home or at work to help you with your plan to quit smoking.  Always ask yourself, "Do I need this cigarette or is this just a reflex?" Tell yourself, "Today, I choose not to smoke," or "I do  not want to smoke." You are reminding yourself of your decision to quit.  Do not replace cigarette smoking with electronic cigarettes (commonly called e-cigarettes). The safety of e-cigarettes is unknown, and some may contain harmful chemicals.  If you relapse, do not give up! Plan ahead and think about what you will do the next time you get the urge to smoke. HOW WILL I FEEL WHEN I QUIT SMOKING? You may have symptoms of withdrawal because your body is used to nicotine (the addictive substance in cigarettes). You may crave cigarettes, be irritable, feel very hungry, cough often, get headaches, or have difficulty concentrating. The withdrawal symptoms are only temporary. They are strongest when you first quit but will go away within 10-14 days. When withdrawal symptoms occur, stay in control. Think about your reasons for quitting. Remind yourself that these are signs that your body is healing and getting used to being without cigarettes. Remember that withdrawal symptoms are easier to treat than the major diseases that smoking can cause.  Even after the withdrawal is over, expect periodic urges to smoke. However, these cravings are generally short lived and will go away whether you smoke or not. Do not smoke! WHAT RESOURCES ARE AVAILABLE TO HELP ME QUIT SMOKING? Your health care provider can direct you to community resources or hospitals for support, which may include:  Group support.  Education.  Hypnosis.  Therapy. Document Released: 12/04/2003 Document Revised: 07/22/2013 Document Reviewed: 08/23/2012 [facility] Patient Information 2015 [place], [place]. This information is not intended to replace advice given to you by your health care provider. Make sure you discuss any questions you have with your health care provider.    Peripheral Vascular Disease Peripheral Vascular Disease (PVD), also called Peripheral Arterial Disease (PAD), is a circulation problem caused by cholesterol  (atherosclerotic plaque) deposits in the arteries. PVD commonly occurs in the lower extremities (legs) but it can occur in other areas of the body, such as your arms. The cholesterol buildup in the arteries reduces blood flow which can cause pain and other serious problems. The presence of PVD can place a person at risk for Coronary Artery Disease (CAD).  CAUSES  Causes of PVD can be many. It is usually associated with more than one risk factor such as:   High Cholesterol.  Smoking.  Diabetes.  Lack of exercise or inactivity.  High blood pressure (hypertension).  Obesity.  Family history. SYMPTOMS   When the lower extremities are affected, patients with PVD may experience:  Leg pain with exertion or physical activity. This is called INTERMITTENT CLAUDICATION. This may present as cramping or numbness with physical activity. The location of the pain is associated with the level of blockage. For example, blockage at the abdominal level (distal abdominal aorta) may result in buttock or hip pain. Lower leg arterial blockage may result in calf pain.  As PVD becomes more severe, pain can develop  with less physical activity.  In people with severe PVD, leg pain may occur at rest.  Other PVD signs and symptoms:  Leg numbness or weakness.  Coldness in the affected leg or foot, especially when compared to the other leg.  A change in leg color.  Patients with significant PVD are more prone to ulcers or sores on toes, feet or legs. These may take longer to heal or may reoccur. The ulcers or sores can become infected.  If signs and symptoms of PVD are ignored, gangrene may occur. This can result in the loss of toes or loss of an entire limb.  Not all leg pain is related to PVD. Other medical conditions can cause leg pain such as:  Blood clots (embolism) or Deep Vein Thrombosis.  Inflammation of the blood vessels (vasculitis).  Spinal stenosis. DIAGNOSIS  Diagnosis of PVD can involve  several different types of tests. These can include:  Pulse Volume Recording Method (PVR). This test is simple, painless and does not involve the use of X-rays. PVR involves measuring and comparing the blood pressure in the arms and legs. An ABI (Ankle-Brachial Index) is calculated. The normal ratio of blood pressures is 1. As this number becomes smaller, it indicates more severe disease.  < 0.95 - indicates significant narrowing in one or more leg vessels.  <0.8 - there will usually be pain in the foot, leg or buttock with exercise.  <0.4 - will usually have pain in the legs at rest.  <0.25 - usually indicates limb threatening PVD.  Doppler detection of pulses in the legs. This test is painless and checks to see if you have a pulses in your legs/feet.  A dye or contrast material (a substance that highlights the blood vessels so they show up on x-ray) may be given to help your caregiver better see the arteries for the following tests. The dye is eliminated from your body by the kidney's. Your caregiver may order blood work to check your kidney function and other laboratory values before the following tests are performed:  Magnetic Resonance Angiography (MRA). An MRA is a picture study of the blood vessels and arteries. The MRA machine uses a large magnet to produce images of the blood vessels.  Computed Tomography Angiography (CTA). A CTA is a specialized x-ray that looks at how the blood flows in your blood vessels. An IV may be inserted into your arm so contrast dye can be injected.  Angiogram. Is a procedure that uses x-rays to look at your blood vessels. This procedure is minimally invasive, meaning a small incision (cut) is made in your groin. A small tube (catheter) is then inserted into the artery of your groin. The catheter is guided to the blood vessel or artery your caregiver wants to examine. Contrast dye is injected into the catheter. X-rays are then taken of the blood vessel or  artery. After the images are obtained, the catheter is taken out. TREATMENT  Treatment of PVD involves many interventions which may include:  Lifestyle changes:  Quitting smoking.  Exercise.  Following a low fat, low cholesterol diet.  Control of diabetes.  Foot care is very important to the PVD patient. Good foot care can help prevent infection.  Medication:  Cholesterol-lowering medicine.  Blood pressure medicine.  Anti-platelet drugs.  Certain medicines may reduce symptoms of Intermittent Claudication.  Interventional/Surgical options:  Angioplasty. An Angioplasty is a procedure that inflates a balloon in the blocked artery. This opens the blocked artery to improve blood  flow.  Stent Implant. A wire mesh tube (stent) is placed in the artery. The stent expands and stays in place, allowing the artery to remain open.  Peripheral Bypass Surgery. This is a surgical procedure that reroutes the blood around a blocked artery to help improve blood flow. This type of procedure may be performed if Angioplasty or stent implants are not an option. SEEK IMMEDIATE MEDICAL CARE IF:   You develop pain or numbness in your arms or legs.  Your arm or leg turns cold, becomes blue in color.  You develop redness, warmth, swelling and pain in your arms or legs. MAKE SURE YOU:   Understand these instructions.  Will watch your condition.  Will get help right away if you are not doing well or get worse.

## 2014-12-01 ENCOUNTER — Other Ambulatory Visit: Payer: Self-pay

## 2014-12-01 ENCOUNTER — Other Ambulatory Visit: Payer: Self-pay | Admitting: *Deleted

## 2014-12-01 ENCOUNTER — Ambulatory Visit
Admission: RE | Admit: 2014-12-01 | Discharge: 2014-12-01 | Disposition: A | Discharge status: Home / Self Care | Payer: Medicare Other | Source: Ambulatory Visit | Attending: Vascular Surgery | Admitting: Vascular Surgery

## 2014-12-01 DIAGNOSIS — I70209 Unspecified atherosclerosis of native arteries of extremities, unspecified extremity: Secondary | ICD-10-CM

## 2014-12-01 DIAGNOSIS — R160 Hepatomegaly, not elsewhere classified: Secondary | ICD-10-CM

## 2014-12-01 DIAGNOSIS — L98499 Non-pressure chronic ulcer of skin of other sites with unspecified severity: Principal | ICD-10-CM

## 2014-12-01 MED ORDER — IOPAMIDOL (ISOVUE-370) INJECTION 76%
100.0000 mL | Freq: Once | INTRAVENOUS | Status: AC | PRN
  Administered 2014-12-01: 100 mL via INTRAVENOUS

## 2014-12-02 ENCOUNTER — Encounter: Payer: Self-pay | Admitting: Vascular Surgery

## 2014-12-02 ENCOUNTER — Ambulatory Visit (HOSPITAL_COMMUNITY)
Admission: RE | Admit: 2014-12-02 | Discharge: 2014-12-02 | Disposition: A | Discharge status: Home / Self Care | Payer: Medicare Other | Source: Ambulatory Visit | Attending: Vascular Surgery | Admitting: Vascular Surgery

## 2014-12-02 ENCOUNTER — Other Ambulatory Visit: Payer: Self-pay | Admitting: Vascular Surgery

## 2014-12-02 DIAGNOSIS — K8020 Calculus of gallbladder without cholecystitis without obstruction: Secondary | ICD-10-CM | POA: Diagnosis not present

## 2014-12-02 DIAGNOSIS — R160 Hepatomegaly, not elsewhere classified: Secondary | ICD-10-CM | POA: Diagnosis present

## 2014-12-02 MED ORDER — GADOBENATE DIMEGLUMINE 529 MG/ML IV SOLN
10.0000 mL | Freq: Once | INTRAVENOUS | Status: AC | PRN
  Administered 2014-12-02: 9 mL via INTRAVENOUS

## 2014-12-04 ENCOUNTER — Ambulatory Visit (INDEPENDENT_AMBULATORY_CARE_PROVIDER_SITE_OTHER): Payer: Medicare Other | Admitting: Vascular Surgery

## 2014-12-04 ENCOUNTER — Encounter: Payer: Self-pay | Admitting: Vascular Surgery

## 2014-12-04 VITALS — BP 137/65 | HR 68 | Ht 65.0 in | Wt 115.4 lb

## 2014-12-04 DIAGNOSIS — I75022 Atheroembolism of left lower extremity: Secondary | ICD-10-CM | POA: Diagnosis not present

## 2014-12-04 DIAGNOSIS — K8689 Other specified diseases of pancreas: Secondary | ICD-10-CM | POA: Insufficient documentation

## 2014-12-04 DIAGNOSIS — I739 Peripheral vascular disease, unspecified: Secondary | ICD-10-CM

## 2014-12-04 DIAGNOSIS — K869 Disease of pancreas, unspecified: Secondary | ICD-10-CM

## 2014-12-04 DIAGNOSIS — R160 Hepatomegaly, not elsewhere classified: Secondary | ICD-10-CM | POA: Diagnosis not present

## 2014-12-04 NOTE — Addendum Note (Signed)
Addended by: Dorthula Rue L on: 12/04/2014 11:19 AM   Modules accepted: Orders

## 2014-12-04 NOTE — Progress Notes (Signed)
Patient is a 69 year old female who returns for follow-up today. She had a discolored left fifth toe. She was scheduled for CT angiogram of the abdomen and pelvis with runoff. She states the toe has improved but is still slightly painful. She is currently on aspirin.  Physical exam:  Filed Vitals:   12/04/14 0944  BP: 137/65  Pulse: 68  Height: 5\' 5"  (1.651 m)  Weight: 115 lb 6.4 oz (52.345 kg)  SpO2: 97%    Extremities: Left fifth toe slightly dusky but improved from her previous office visit  Data: CT angiogram the abdomen and pelvis with runoff is reviewed today. There is a 50% stenosis of the left superficial femoral artery. Otherwise her arterial tree is without any significant pathology. However, a mass was detected in the central aspect of her liver. This was approximate 5-6 cm in diameter. A follow-up MRI of the abdomen was performed which suggests that this mass may be malignant. There were also several masses noted in the tail of the pancreas.  Assessment: Improved left fifth toe. In light of the fact that the plaque in her left SFA seemed fairly benign I believe the best option at this point would be dual antiplatelets therapy with Plavix and aspirin for 6 months. If her symptoms are overall stable at the end of 6 months we could switch back to just aspirin alone. I have her prescription for Plavix today but told her not to fill this until after she is seen by the oncology doctors as she will most likely need a biopsy of her liver. After the workup of her liver/pancreas mass is concluded she could start her Plavix at that point. The findings of the CT and MRI scan were discussed in great detail and images reviewed with the patient and her daughter today.  Plan: See above follow-up with her nurse practitioner in 6 months with repeat ABIs.  If the patient has an additional embolic event she may need to be considered for left superficial femoral artery stenting.  Ruta Hinds,  MD [facility] Office: [phone] Pager: [phone]

## 2014-12-05 ENCOUNTER — Telehealth: Payer: Self-pay | Admitting: Hematology

## 2014-12-05 NOTE — Telephone Encounter (Signed)
New patient appt-s/w patient and gave np appt for 09/22 @ 10:45 w/Dr. Burr Medico Referring Dr. Juanda Crumble, [facility] Dx-Liver mass, Pancreatic mass

## 2014-12-08 ENCOUNTER — Ambulatory Visit: Payer: Medicare Other | Admitting: Hematology

## 2014-12-11 ENCOUNTER — Telehealth: Payer: Self-pay | Admitting: Hematology

## 2014-12-11 ENCOUNTER — Ambulatory Visit (HOSPITAL_BASED_OUTPATIENT_CLINIC_OR_DEPARTMENT_OTHER): Payer: Medicare Other

## 2014-12-11 ENCOUNTER — Ambulatory Visit (HOSPITAL_BASED_OUTPATIENT_CLINIC_OR_DEPARTMENT_OTHER): Payer: Medicare Other | Admitting: Hematology

## 2014-12-11 ENCOUNTER — Encounter: Payer: Self-pay | Admitting: Hematology

## 2014-12-11 VITALS — BP 149/66 | HR 78 | Temp 98.3°F | Resp 18 | Ht 65.0 in | Wt 114.9 lb

## 2014-12-11 DIAGNOSIS — R160 Hepatomegaly, not elsewhere classified: Secondary | ICD-10-CM | POA: Diagnosis not present

## 2014-12-11 DIAGNOSIS — K869 Disease of pancreas, unspecified: Secondary | ICD-10-CM

## 2014-12-11 DIAGNOSIS — Z720 Tobacco use: Secondary | ICD-10-CM

## 2014-12-11 DIAGNOSIS — J449 Chronic obstructive pulmonary disease, unspecified: Secondary | ICD-10-CM

## 2014-12-11 LAB — COMPREHENSIVE METABOLIC PANEL (CC13)
ALT: 23 U/L (ref 0–55)
AST: 26 U/L (ref 5–34)
Albumin: 4.3 g/dL (ref 3.5–5.0)
Alkaline Phosphatase: 117 U/L (ref 40–150)
Anion Gap: 8 mEq/L (ref 3–11)
BUN: 9.1 mg/dL (ref 7.0–26.0)
CO2: 30 mEq/L — ABNORMAL HIGH (ref 22–29)
Calcium: 9.7 mg/dL (ref 8.4–10.4)
Chloride: 99 mEq/L (ref 98–109)
Creatinine: 0.7 mg/dL (ref 0.6–1.1)
EGFR: 89 mL/min/{1.73_m2} — ABNORMAL LOW (ref >90)
Glucose: 97 mg/dl (ref 70–140)
Potassium: 4.3 mEq/L (ref 3.5–5.1)
Sodium: 137 mEq/L (ref 136–145)
Total Bilirubin: 0.49 mg/dL (ref 0.20–1.20)
Total Protein: 7.4 g/dL (ref 6.4–8.3)

## 2014-12-11 LAB — CBC WITH DIFFERENTIAL/PLATELET
BASO%: 0.6 % (ref 0.0–2.0)
Basophils Absolute: 0.1 10*3/uL (ref 0.0–0.1)
EOS%: 1.1 % (ref 0.0–7.0)
Eosinophils Absolute: 0.1 10*3/uL (ref 0.0–0.5)
HCT: 44.5 % (ref 34.8–46.6)
HGB: 14.8 g/dL (ref 11.6–15.9)
LYMPH%: 31 % (ref 14.0–49.7)
MCH: 29.8 pg (ref 25.1–34.0)
MCHC: 33.3 g/dL (ref 31.5–36.0)
MCV: 89.7 fL (ref 79.5–101.0)
MONO#: 0.6 10*3/uL (ref 0.1–0.9)
MONO%: 6.9 % (ref 0.0–14.0)
NEUT#: 5.5 10*3/uL (ref 1.5–6.5)
NEUT%: 60.4 % (ref 38.4–76.8)
Platelets: 310 10*3/uL (ref 145–400)
RBC: 4.96 10*6/uL (ref 3.70–5.45)
RDW: 13.4 % (ref 11.2–14.5)
WBC: 9.1 10*3/uL (ref 3.9–10.3)
lymph#: 2.8 10*3/uL (ref 0.9–3.3)

## 2014-12-11 NOTE — Telephone Encounter (Signed)
Pt confirmed labs/ov per 09/22 POF, gave pt AVS and Calendar... KJ °

## 2014-12-11 NOTE — Progress Notes (Signed)
[place]  [phone] Fax:([phone]) [place] Note   Patient Care Team: Orpah Melter, MD as PCP - General (Family Medicine) 12/11/2014  Referring physician Dr. Oneida Alar  CHIEF COMPLAINTS:  Liver mass   HISTORY OF PRESENTING ILLNESS:  Tina Bailey 69 y.o. female is here because of recently discovered liver mass.  She has had abdominal pain after meals for the past 4 years, occurs once every several months, usually resolves after 20-30 mins on its own. She was seen by her PCP, had [place] and her symptoms was felt to be related to her gall bladder stone.   She has had some pressure feeling under right ribcage, which radiates to back, for the past 8-9 months, and slight decrease her appetite, and fatigue, she lost about 10 lbs.   She went to see Dr. Oneida Alar for left 5th toes color change (blue), no claudication, she underwent CT angiogram which incidentally showed a large liver mass and a 1.2 cm pancreatic cystic lesion. She was referred to [place] for further management.   She never had colonoscopy. She denies melena, and she is here, change of her bowel habit. Se lives with her daughter. She has extensive smoking history, no family history of malignancy.  MEDICAL HISTORY:  Past Medical History  Diagnosis Date  . COPD (chronic obstructive pulmonary disease)   . Productive cough   . Wears glasses   . Arthritis     DDD  . Fibromyalgia   . Retina disorder     L eye, vision distorted, edema  . Macular degeneration     R eye  . Blue toe syndrome 11/27/2014    SURGICAL HISTORY: Past Surgical History  Procedure Laterality Date  . Partial hysterectomy    . Kidney stone surgery    . Angioplasty  2008    no stents required, no follow-up with cardiologist, no recurrent chest pain  . Eye surgery      SOCIAL HISTORY: Social History   Social History  . Marital Status: Divorced    Spouse Name: N/A  . Number of Children: 3   . Years of  Education: N/A   Occupational History  . 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 Smoking status: Current Every Day Smoker -- 1.00 packs/day for 45 years    Types: Cigarettes  . Smokeless tobacco: Never Used  . Alcohol Use: No  . Drug Use: No  . Sexual Activity: Not on file   Other Topics Concern  . Not on file   Social History Narrative    FAMILY HISTORY: Family History  Problem Relation Age of Onset  . Hypertension Mother   . Atrial fibrillation Mother   . Heart disease Mother     after age 60  . Stroke Father   . Dementia Father   . Heart disease Brother     After age 59- A-Fib  . Heart attack Brother     ALLERGIES:  is allergic to paroxetine hcl and sulfa antibiotics.  MEDICATIONS:  Current Outpatient Prescriptions  Medication Sig Dispense Refill  . aspirin EC 81 MG tablet Take 1 tablet (81 mg total) by mouth daily. 150 tablet 2  . aspirin-acetaminophen-caffeine (EXCEDRIN MIGRAINE) 185-631-49 MG per tablet Take by mouth every 6 (six) hours as needed for headache.     No current facility-administered medications for this visit.    REVIEW OF SYSTEMS:   Constitutional: Denies fevers, chills  or abnormal night sweats, (+) fatigue and 10 lb weight loss  Eyes: Denies blurriness of vision, double vision or watery eyes Ears, nose, mouth, throat, and face: Denies mucositis or sore throat Respiratory: Denies cough, dyspnea or wheezes Cardiovascular: Denies palpitation, chest discomfort or lower extremity swelling Gastrointestinal:  Denies nausea, heartburn or change in bowel habits, se HPI  Skin: Denies abnormal skin rashes Lymphatics: Denies new lymphadenopathy or easy bruising Neurological:Denies numbness, tingling or new weaknesses Behavioral/Psych: Mood is stable, no new changes  All other systems were reviewed with the patient and are negative.  PHYSICAL EXAMINATION: ECOG PERFORMANCE STATUS: 1 - Symptomatic but completely  ambulatory  Filed Vitals:   12/11/14 1118  BP: 149/66  Pulse: 78  Temp: 98.3 F (36.8 C)  Resp: 18   Filed Weights   12/11/14 1118  Weight: 114 lb 14.4 oz (52.118 kg)    GENERAL:alert, no distress and comfortable SKIN: skin color, texture, turgor are normal, no rashes or significant lesions EYES: normal, conjunctiva are pink and non-injected, sclera clear OROPHARYNX:no exudate, no erythema and lips, buccal mucosa, and tongue normal  NECK: supple, thyroid normal size, non-tender, without nodularity LYMPH:  no palpable lymphadenopathy in the cervical, axillary or inguinal LUNGS: clear to auscultation and percussion with normal breathing effort HEART: regular rate & rhythm and no murmurs and no lower extremity edema ABDOMEN:abdomen soft, non-tender and normal bowel sounds Musculoskeletal:no cyanosis of digits and no clubbing  PSYCH: alert & oriented x 3 with fluent speech NEURO: no focal motor/sensory deficits  LABORATORY DATA:  I have reviewed the data as listed Lab Results  Component Value Date   WBC 9.9 01/19/2011   HGB 14.0 01/19/2011   HCT 42.6 01/19/2011   MCV 89.9 01/19/2011   PLT 307 01/19/2011   No results for input(s): NA, K, CL, CO2, GLUCOSE, BUN, CREATININE, CALCIUM, GFRNONAA, GFRAA, PROT, ALBUMIN, AST, ALT, ALKPHOS, BILITOT, BILIDIR, IBILI in the last 8760 hours.  RADIOGRAPHIC STUDIES: I have personally reviewed the radiological images as listed and agreed with the findings in the report.  Mr Liver W Wo Contrast 12/02/2014   IMPRESSION: 1. The first lesion of concern is a fairly vascular mass spanning segments 5 and 4B of the liver with capsule or pseudocapsule appearance, and central enhancement which spreads peripherally on the later images, and also with associated hepatic capsular retraction. Differential diagnostic considerations for this lesion include peripheral cholangiocarcinoma ; hepatic epitheliod hemangioendothelioma ; hepatocellular carcinoma ; or  treated metastatic disease. Biopsy is likely warranted. 2. The second lesion of concern is a 1.2 cm pancreatic body lesion with high T2 and low T1 signal characteristics, questionable attachment to the dorsal pancreatic duct, and some internal septation and probably some faint nodular enhancement internally. The appearance on CT, for example image 62 series 9, is somewhat more concerning for internal enhancement, although measurable enhancement is present on MRI. This could be a small macro cystic tumor with solid component; the primary ductal pancreatic adenocarcinoma with cystic degeneration; were cystic degeneration of an islet cell tumor such as insulinoma or glucagon adenoma. Metastatic disease to the pancreas could possibly appear this way. Intraductal papillary mucinous neoplasm with small solid component could also appear this way. Biopsy, surveillance, or further imaging characterization with nuclear medicine PET-CT may be warranted. 3. Cholelithiasis. 4. Mild biliary dilatation. 5. Mildly enlarged portacaval lymph node. In the setting of the hepatic and pancreatic lesions the appearance raises concern for the possibility of early local metastatic disease. This could also be further characterized  at PET-CT.   Electronically Signed   By: Van Clines M.D.   On: 12/02/2014 08:39   Ct Angio Ao+bifem W/cm &/or Wo/cm 12/01/2014    IMPRESSION: VASCULAR  1. Overall, fairly minimal atherosclerotic vascular disease. 2. There is a solitary mild-moderate (40- 50%) focal stenosis of the left superficial femoral artery in the proximal thigh secondary to fibro fatty atherosclerotic plaque. 3. Otherwise, no evidence of significant stenosis in either lower extremity. Three-vessel runoff is patent to the ankle. NON VASCULAR  1. Heterogeneous mass like lesion in the inferior aspect of the liver primarily involving hepatic segments 5 and 4B measures approximately 5.8 x 6.6 cm. Differential considerations include both  benign entities such as a focal nodular hyperplasia, and malignant neoplasms including both primary hepatocellular carcinoma and cholangiocarcinoma, as well as metastatic disease such as a breast cancer (given suggestion of subcapsular retraction). Recommend further evaluation with MRI with and without gadolinium. 2. Cholelithiasis. 3. Colonic diverticular disease without CT evidence of active inflammation. 4. Adreniform thickening of the left adrenal gland may represent small adenomas or adrenal hyperplasia. 5. Chronic appearing compression fractures at L3 and L4. 6. Multilevel degenerative disc disease and lower lumbar facet arthropathy.  Signed,  Criselda Peaches, MD  [facility]  [facility]   Electronically Signed   By: Jacqulynn Cadet M.D.   On: 12/01/2014 11:08    ASSESSMENT & PLAN: 69 year old African-American female, with 45-pack-year smoking history, presented with incidental scan finding of a large peripheral liver lesion in the right lobe segmental 5 and a 4B, and a small pancreatic cystic lesion.  1. Liver mass, probable malignancy -Reviewed her CT and liver MRI image in person with patient and her daughter. -Her case was reviewed in our tumor board yesterday, the right large liver lesion is certainly very concerning for malignancy. She has no underlying liver disease or image evidence of liver cirrhosis, hepatocellular carcinoma is less likely. Cholangiocarcinoma is most likely, however metastatic disease, is also possible, although less likely, giving the single large liver lesion. -I recommend ultrasound guided liver biopsy by interventional radiology, to get a tissue diagnosis. The potential risks of the procedure were discussed with patient. She agrees to proceed. -If the tissue biopsy reviews malignancy, I recommend her to have a PET scan for staging. -I'll discuss treatment options when we have the tissue diagnosis. -If the biopsy  shows likely cholangiocarcinoma, this is probably surgically resectable.  2. Pancreatic lesion -Abdominal MRI showed a 1.2 cm pancreatic body lesion, likely cystic with some solid component.  -Her imaging was reviewed in our tumor yesterday, radiologist feels they pancreatic lesion is less likely malignancy. -We will decide if we need further EUS or biopsy of the pancreatic lesion when we have the results from liver biopsy.  3. COPD  -She will continue to follow-up with her primary care physician  4. Smoking cessation -I discussed the smoking cessation extensively. She is willing to cut back, not ready for crit completely yet.  Plan -IR US guided liver mass biopsy -PET scan after liver biopsy -I'll see him back in 2-3 weeks to review the above test results.  All questions were answered. The patient knows to call the clinic with any problems, questions or concerns. I spent 55 minutes counseling the patient face to face. The total time spent in the appointment was 60 minutes and more than 50% was on counseling.     Truitt Merle, MD 12/11/2014 11:50 AM

## 2014-12-12 LAB — CANCER ANTIGEN 19-9: CA 19-9: 11.2 U/mL (ref <35.0)

## 2014-12-12 LAB — AFP TUMOR MARKER: AFP-Tumor Marker: 15.7 ng/mL — ABNORMAL HIGH (ref <6.1)

## 2014-12-12 LAB — CEA: CEA: 3.6 ng/mL (ref 0.0–5.0)

## 2014-12-15 ENCOUNTER — Other Ambulatory Visit: Payer: Self-pay

## 2014-12-15 ENCOUNTER — Other Ambulatory Visit: Payer: Self-pay | Admitting: Hematology

## 2014-12-15 DIAGNOSIS — I771 Stricture of artery: Secondary | ICD-10-CM

## 2014-12-15 DIAGNOSIS — R160 Hepatomegaly, not elsewhere classified: Secondary | ICD-10-CM

## 2014-12-15 DIAGNOSIS — I739 Peripheral vascular disease, unspecified: Secondary | ICD-10-CM

## 2014-12-15 MED ORDER — CLOPIDOGREL BISULFATE 75 MG PO TABS: 75.0000 mg | ORAL_TABLET | Freq: Every day | ORAL | Status: DC

## 2014-12-15 NOTE — Progress Notes (Signed)
Rec'd phone call from pt.  Reported that Dr. Oneida Alar was going to order Plavix, to be started after the Oncologist gave approval to do so.  Plavix 75 mg, 1 tab, po, qd; #30; RF x 6, ordered per Dr. Oneida Alar' progress note of 12/04/14.  Pt. was reminded to start Plavix after approval from Oncologist.  Verb. Understanding.

## 2014-12-16 ENCOUNTER — Telehealth: Payer: Self-pay | Admitting: *Deleted

## 2014-12-16 NOTE — Telephone Encounter (Signed)
Call to Central Scheduling to f/u on liver biopsy request. Case is in review-awaiting IR to respond.

## 2014-12-17 ENCOUNTER — Other Ambulatory Visit: Payer: Self-pay | Admitting: Radiology

## 2014-12-18 ENCOUNTER — Other Ambulatory Visit: Payer: Self-pay | Admitting: Radiology

## 2014-12-18 ENCOUNTER — Telehealth: Payer: Self-pay | Admitting: *Deleted

## 2014-12-18 NOTE — Telephone Encounter (Signed)
Received vm call from pt asking for return call regarding biopsy tomorrow.  Returned call & pt wanted to know if the tissue biopsy for tomorrow will be tested for any thing other than cancer.  Informed that the tissue sample will be sent to Pathology & they should be able to determine if the sample is cancer or benign.  She asked about other lab test that were done at this office.  Informed that everything was normal except AFP & Dr Burr Medico would discuss at next visit.  She would like a copy of the results & she will request when she is here for next visit. She was very appreciative of information.

## 2014-12-19 ENCOUNTER — Ambulatory Visit (HOSPITAL_COMMUNITY)
Admission: RE | Admit: 2014-12-19 | Discharge: 2014-12-19 | Disposition: A | Discharge status: Home / Self Care | Payer: Medicare Other | Source: Ambulatory Visit | Attending: Hematology | Admitting: Hematology

## 2014-12-19 ENCOUNTER — Encounter (HOSPITAL_COMMUNITY): Payer: Self-pay

## 2014-12-19 DIAGNOSIS — H3530 Unspecified macular degeneration: Secondary | ICD-10-CM | POA: Diagnosis not present

## 2014-12-19 DIAGNOSIS — Z882 Allergy status to sulfonamides status: Secondary | ICD-10-CM | POA: Diagnosis not present

## 2014-12-19 DIAGNOSIS — R160 Hepatomegaly, not elsewhere classified: Secondary | ICD-10-CM

## 2014-12-19 DIAGNOSIS — Z7982 Long term (current) use of aspirin: Secondary | ICD-10-CM | POA: Diagnosis not present

## 2014-12-19 DIAGNOSIS — M797 Fibromyalgia: Secondary | ICD-10-CM | POA: Insufficient documentation

## 2014-12-19 DIAGNOSIS — F17200 Nicotine dependence, unspecified, uncomplicated: Secondary | ICD-10-CM | POA: Diagnosis not present

## 2014-12-19 DIAGNOSIS — J449 Chronic obstructive pulmonary disease, unspecified: Secondary | ICD-10-CM | POA: Diagnosis not present

## 2014-12-19 DIAGNOSIS — C220 Liver cell carcinoma: Secondary | ICD-10-CM | POA: Insufficient documentation

## 2014-12-19 DIAGNOSIS — Z79899 Other long term (current) drug therapy: Secondary | ICD-10-CM | POA: Insufficient documentation

## 2014-12-19 DIAGNOSIS — Z8249 Family history of ischemic heart disease and other diseases of the circulatory system: Secondary | ICD-10-CM | POA: Diagnosis not present

## 2014-12-19 DIAGNOSIS — K769 Liver disease, unspecified: Secondary | ICD-10-CM | POA: Diagnosis present

## 2014-12-19 LAB — CBC
HCT: 43.2 % (ref 36.0–46.0)
Hemoglobin: 14.7 g/dL (ref 12.0–15.0)
MCH: 30.3 pg (ref 26.0–34.0)
MCHC: 34 g/dL (ref 30.0–36.0)
MCV: 89.1 fL (ref 78.0–100.0)
Platelets: 305 10*3/uL (ref 150–400)
RBC: 4.85 MIL/uL (ref 3.87–5.11)
RDW: 13.1 % (ref 11.5–15.5)
WBC: 9.4 10*3/uL (ref 4.0–10.5)

## 2014-12-19 LAB — PROTIME-INR
INR: 0.99 (ref 0.00–1.49)
Prothrombin Time: 13.3 seconds (ref 11.6–15.2)

## 2014-12-19 LAB — APTT: aPTT: 33 seconds (ref 24–37)

## 2014-12-19 MED ORDER — FENTANYL CITRATE (PF) 100 MCG/2ML IJ SOLN
INTRAMUSCULAR | Status: AC
  Filled 2014-12-19: qty 2

## 2014-12-19 MED ORDER — SODIUM CHLORIDE 0.9 % IV SOLN
Freq: Once | INTRAVENOUS | Status: AC
  Administered 2014-12-19: 11:00:00 via INTRAVENOUS

## 2014-12-19 MED ORDER — MIDAZOLAM HCL 2 MG/2ML IJ SOLN
INTRAMUSCULAR | Status: AC
  Filled 2014-12-19: qty 4

## 2014-12-19 MED ORDER — NALOXONE HCL 0.4 MG/ML IJ SOLN
INTRAMUSCULAR | Status: AC
  Filled 2014-12-19: qty 1

## 2014-12-19 MED ORDER — FLUMAZENIL 0.5 MG/5ML IV SOLN
INTRAVENOUS | Status: AC
  Filled 2014-12-19: qty 5

## 2014-12-19 MED ORDER — MIDAZOLAM HCL 2 MG/2ML IJ SOLN
INTRAMUSCULAR | Status: AC | PRN
  Administered 2014-12-19: 0.5 mg via INTRAVENOUS
  Administered 2014-12-19: 1 mg via INTRAVENOUS

## 2014-12-19 MED ORDER — FENTANYL CITRATE (PF) 100 MCG/2ML IJ SOLN
INTRAMUSCULAR | Status: AC | PRN
  Administered 2014-12-19: 50 ug via INTRAVENOUS

## 2014-12-19 NOTE — Discharge Instructions (Signed)
Call [facility] at [phone] for any problems or questions. No change in medication or diet. Quiet rest x 24 hours.  Remove bandaid in 24 hours and may bathe as usual.  Liver Biopsy, Care After Refer to this sheet in the next few weeks. These instructions provide you with information on caring for yourself after your procedure. Your health care provider may also give you more specific instructions. Your treatment has been planned according to current medical practices, but problems sometimes occur. Call your health care provider if you have any problems or questions after your procedure. WHAT TO EXPECT AFTER THE PROCEDURE After your procedure, it is typical to have the following:  A small amount of discomfort in the area where the biopsy was done and in the right shoulder or shoulder blade.  A small amount of bruising around the area where the biopsy was done and on the skin over the liver.  Sleepiness and fatigue for the rest of the day. HOME CARE INSTRUCTIONS   Rest at home for 1-2 days or as directed by your health care provider.  Have a friend or family member stay with you for at least 24 hours.  Because of the medicines used during the procedure, you should not do the following things in the first 24 hours:  Drive.  Use machinery.  Be responsible for the care of other people.  Sign legal documents.  Take a bath or shower.  There are many different ways to close and cover an incision, including stitches, skin glue, and adhesive strips. Follow your health care provider's instructions on:  Incision care.  Bandage (dressing) changes and removal.  Incision closure removal.  Do not drink alcohol in the first week.  Do not lift more than 5 pounds or play contact sports for 2 weeks after this test.  Take medicines only as directed by your health care provider. Do not take medicine containing aspirin or non-steroidal anti-inflammatory medicines such as  ibuprofen for 1 week after this test.  It is your responsibility to get your test results. SEEK MEDICAL CARE IF:   You have increased bleeding from an incision that results in more than a small spot of blood.  You have redness, swelling, or increasing pain in any incisions.  You notice a discharge or a bad smell coming from any of your incisions.  You have a fever or chills. SEEK IMMEDIATE MEDICAL CARE IF:   You develop swelling, bloating, or pain in your abdomen.  You become dizzy or faint.  You develop a rash.  You are nauseous or vomit.  You have difficulty breathing, feel short of breath, or feel faint.  You develop chest pain.  You have problems with your speech or vision.  You have trouble balancing or moving your arms or legs. Document Released: 09/24/2004 Document Revised: 07/22/2013 Document Reviewed: 05/03/2013 [facility] Patient Information 2015 [place], [place]. This information is not intended to replace advice given to you by your health care provider. Make sure you discuss any questions you have with your health care provider. Liver Biopsy The liver is a large organ in the upper right-hand side of your abdomen. A liver biopsy is a procedure in which a tissue sample is taken from the liver and examined under a microscope. The procedure is done to confirm a suspected problem. There are three types of liver biopsies:  Percutaneous. In this type, an incision is made in your abdomen. The sample is removed through the incision with a needle.  Laparoscopic. In this type, several incisions are made in the abdomen. A tiny camera is passed through one of the incisions to help guide the health care provider. The sample is removed through the other incision or incisions.  Transjugular. In this type, an incision is made in the neck. A tube is passed through the incision to the liver. The sample is removed through the tube with a needle. LET [facility] CARE PROVIDER KNOW  ABOUT:  Any allergies you have.  All medicines you are taking, including vitamins, herbs, eye drops, creams, and over-the-counter medicines.  Previous problems you or members of your family have had with the use of anesthetics.  Any blood disorders you have.  Previous surgeries you have had.  Medical conditions you have.  Possibility of pregnancy, if this applies. RISKS AND COMPLICATIONS Generally, this is a safe procedure. However, problems can occur and include:  Bleeding.  Infection.  Bruising.  Collapsed lung.  Leak of digestive juices (bile) from the liver or gallbladder.  Problems with heart rhythm.  Pain at the biopsy site or in the right shoulder.  Low blood pressure (hypotension).  Injury to nearby organs or tissues. BEFORE THE PROCEDURE  Your health care provider may do some blood or urine tests. These will help your health care provider learn how well your kidneys and liver are working and how well your blood clots.  Ask your health care provider if you will be able to go home the day of the procedure. Arrange for someone to take you home and stay with you for at least 24 hours.  Do not eat or drink anything after midnight on the night before the procedure or as directed by your health care provider.  Ask your health care provider about:  Changing or stopping your regular medicines. This is especially important if you are taking diabetes medicines or blood thinners.  Taking medicines such as aspirin and ibuprofen. These medicines can thin your blood. Do not take these medicines before your procedure if your health care provider asks you not to. PROCEDURE Regardless of the type of biopsy that will be done, you will have an IV line placed. Through this line, you will receive fluids and medicine to relax you. If you will be having a laparoscopic biopsy, you may also receive medicine through this line to make you sleep during the procedure (general  anesthetic). Percutaneous Liver Biopsy  You will positioned on your back, with your right hand over your head.  A health care provider will locate your liver by tapping and pressing on the right side of your abdomen or with the help of an ultrasound machine or CT scan.  An area at the bottom of your last right rib will be numbed.  An incision will be made in the numbed area.  The biopsy needle will be inserted into the incision.  Several samples of liver tissue will be taken with the biopsy needle. You will be asked to hold your breath as each sample is taken. Laparoscopic Liver Biopsy  You will be positioned on your back.  Several small incisions will be made in your abdomen.  Your doctor will pass a tiny camera through one incision. The camera will allow the liver to be viewed on a TV monitor in the operating room.  Tools will be passed through the other incision or incisions. These tools will be used to remove samples of liver tissue. Transjugular Liver Biopsy  You will be positioned on your back  on an X-ray table, with your head turned to your left.  An area on your neck just over your jugular vein will be numbed.  An incision will be made in the numbed area.  A tiny tube will be inserted through the incision. It will be pushed through the jugular vein to a blood vessel in the liver called the hepatic vein.  Dye will be inserted through the tube, and X-rays will be taken. The dye will make the blood vessels in the liver light up on the X-rays.  The biopsy needle will be pushed through the tube until it reaches the liver.  Samples of liver tissue will be taken with the biopsy needle.  The needle and the tube will be removed. After the samples are obtained, the incision or incisions will be closed. AFTER THE PROCEDURE  You will be taken to a recovery area.  You may have to lie on your right side for 1-2 hours. This will prevent bleeding from the biopsy site.  Your  progress will be watched. Your blood pressure, pulse, and the biopsy site will be checked often.  You may have some pain or feel sick. If this happens, tell your health care provider.  As you begin to feel better, you will be offered ice and beverages.  You may be allowed to go home when the medicines have worn off and you can walk, drink, eat, and use the bathroom. Document Released: 05/28/2003 Document Revised: 07/22/2013 Document Reviewed: 05/03/2013 [facility] Patient Information 2015 [place], [place]. This information is not intended to replace advice given to you by your health care provider. Make sure you discuss any questions you have with your health care provider. Conscious Sedation Sedation is the use of medicines to promote relaxation and relieve discomfort and anxiety. Conscious sedation is a type of sedation. Under conscious sedation you are less alert than normal but are still able to respond to instructions or stimulation. Conscious sedation is used during short medical and dental procedures. It is milder than deep sedation or general anesthesia and allows you to return to your regular activities sooner.  LET [facility] CARE PROVIDER KNOW ABOUT:   Any allergies you have.  All medicines you are taking, including vitamins, herbs, eye drops, creams, and over-the-counter medicines.  Use of steroids (by mouth or creams).  Previous problems you or members of your family have had with the use of anesthetics.  Any blood disorders you have.  Previous surgeries you have had.  Medical conditions you have.  Possibility of pregnancy, if this applies.  Use of cigarettes, alcohol, or illegal drugs. RISKS AND COMPLICATIONS Generally, this is a safe procedure. However, as with any procedure, problems can occur. Possible problems include:  Oversedation.  Trouble breathing on your own. You may need to have a breathing tube until you are awake and breathing on your own.  Allergic  reaction to any of the medicines used for the procedure. BEFORE THE PROCEDURE  You may have blood tests done. These tests can help show how well your kidneys and liver are working. They can also show how well your blood clots.  A physical exam will be done.  Only take medicines as directed by your health care provider. You may need to stop taking medicines (such as blood thinners, aspirin, or nonsteroidal anti-inflammatory drugs) before the procedure.   Do not eat or drink at least 6 hours before the procedure or as directed by your health care provider.  Arrange for a  responsible adult, family member, or friend to take you home after the procedure. He or she should stay with you for at least 24 hours after the procedure, until the medicine has worn off. PROCEDURE   An intravenous (IV) catheter will be inserted into one of your veins. Medicine will be able to flow directly into your body through this catheter. You may be given medicine through this tube to help prevent pain and help you relax.  The medical or dental procedure will be done. AFTER THE PROCEDURE  You will stay in a recovery area until the medicine has worn off. Your blood pressure and pulse will be checked.   Depending on the procedure you had, you may be allowed to go home when you can tolerate liquids and your pain is under control. Document Released: 11/30/2000 Document Revised: 03/12/2013 Document Reviewed: 11/12/2012 [facility] Patient Information 2015 [place], [place]. This information is not intended to replace advice given to you by your health care provider. Make sure you discuss any questions you have with your health care provider.

## 2014-12-19 NOTE — Procedures (Signed)
Interventional Radiology Procedure Note  Procedure: US guided biopsy of right liver mass.  3 x 18G core.  Complications: None Recommendations:  - Ok to shower tomorrow - Do not submerge for 7 days - Routine care   Signed,  Dulcy Fanny. Earleen Newport, DO

## 2014-12-19 NOTE — H&P (Signed)
Chief Complaint: Patient was seen in consultation today for liver lesion biopsy at the request of Feng,Yan  Referring Physician(s): Feng,Yan  History of Present Illness: Tina Bailey is a 69 y.o. female with newly found liver lesion after CTA of the abdomen. She has been seen by Oncology and biopsy is requested. PMHx, meds, labs, imaging reviewed. Pt has been NPO all morning. Is supposed to be starting Plavix for PAD, but has not yet begun due to this biopsy.  Past Medical History  Diagnosis Date  . COPD (chronic obstructive pulmonary disease)   . Productive cough   . Wears glasses   . Arthritis     DDD  . Fibromyalgia   . Retina disorder     L eye, vision distorted, edema  . Macular degeneration     R eye  . Blue toe syndrome 11/27/2014  . Osteoporosis   . Spine fracture due to birth trauma     Past Surgical History  Procedure Laterality Date  . Partial hysterectomy    . Kidney stone surgery    . Angioplasty  2008    no stents required, no follow-up with cardiologist, no recurrent chest pain  . Eye surgery      Allergies: Paroxetine hcl and Sulfa antibiotics  Medications: Prior to Admission medications   Medication Sig Start Date End Date Taking? Authorizing Provider  aspirin EC 81 MG tablet Take 1 tablet (81 mg total) by mouth daily. 11/27/14  Yes Elam Dutch, MD  aspirin-acetaminophen-caffeine ([place]) [phone] MG per tablet Take by mouth every 6 (six) hours as needed for headache.   Yes Historical Provider, MD  ibuprofen (ADVIL,MOTRIN) 200 MG tablet Take 200 mg by mouth every 6 (six) hours as needed for moderate pain.   Yes Historical Provider, MD  clopidogrel (PLAVIX) 75 MG tablet Take 1 tablet (75 mg total) by mouth daily. 12/15/14   Elam Dutch, MD     Family History  Problem Relation Age of Onset  . Hypertension Mother   . Atrial fibrillation Mother   . Heart disease Mother     after age 60  . Stroke Father   . Dementia  Father   . Heart disease Brother     After age 77- A-Fib  . Heart attack Brother     Social History   Social History  . Marital Status: Divorced    Spouse Name: N/A  . Number of Children: N/A  . Years of Education: N/A   Social History Main Topics  . Smoking status: Current Every Day Smoker -- 1.00 packs/day for 45 years    Types: Cigarettes  . Smokeless tobacco: Never Used  . Alcohol Use: No  . Drug Use: No  . Sexual Activity: Not Asked   Other Topics Concern  . None   Social History Narrative     Review of Systems: A 12 point ROS discussed and pertinent positives are indicated in the HPI above.  All other systems are negative.  Review of Systems  Vital Signs: Temp: 98.2, BP: 157/65, HR: 95, RR: 16  Physical Exam  Constitutional: She is oriented to person, place, and time. She appears well-developed and well-nourished. No distress.  HENT:  Head: Normocephalic.  Mouth/Throat: Oropharynx is clear and moist.  Neck: Normal range of motion. No tracheal deviation present. No thyromegaly present.  Cardiovascular: Normal rate, regular rhythm and normal heart sounds.   Pulmonary/Chest: Effort normal and breath sounds normal. No respiratory distress.  Abdominal:  Soft. Bowel sounds are normal. She exhibits no distension and no mass. There is no tenderness.  Neurological: She is alert and oriented to person, place, and time.  Psychiatric: She has a normal mood and affect. Judgment normal.    Mallampati Score:  MD Evaluation Airway: WNL Heart: WNL Abdomen: WNL Chest/ Lungs: WNL ASA  Classification: 2 Mallampati/Airway Score: Two  Imaging: Mr Liver W Wo Contrast  12/02/2014   CLINICAL DATA:  Liver mass discovered 12/01/2014  EXAM: MRI ABDOMEN WITHOUT AND WITH CONTRAST  TECHNIQUE: Multiplanar multisequence MR imaging of the abdomen was performed both before and after the administration of intravenous contrast.  CONTRAST:  63mL MULTIHANCE GADOBENATE DIMEGLUMINE 529 MG/ML  IV SOLN  COMPARISON:  12/01/2014  FINDINGS: Lower chest:  Unremarkable  Hepatobiliary: Is liver mass spanning segments 4B and 5 measures 6.8 by 4.1 by 5.6 cm, with moderately high T2 signal characteristics compared to hepatic parenchyma, and low T1 signal characteristics. No fatty component. Capsular retraction noted. On arterial phase images there are prominent vessels within the mass along with a faint blush of enhancement which intensifies centrally on portal venous phase images and slowly extends peripherally. Capsule or pseudo capsule appearance as on image 66 series 15. Delayed phase images demonstrated a moderate delayed blush of enhancement. No other liver lesions identified. Numerous gallstones nearly entirely fill the gallbladder. Common bile duct 8 mm diameter, mildly dilated for age, but with expected distal conical tapering.  Pancreas: 1.2 cm lesion of the pancreatic body, possible cystic component based on high T2 signal, but suspicion for internal septation and probably some internal enhancement.  Spleen: Unremarkable  Adrenals/Urinary Tract: Several tiny hypointense lesions in both kidneys are likely cysts although technically too small to characterize. No worrisome enhancing renal lesion is seen.  Stomach/Bowel: Unremarkable  Vascular/Lymphatic: 1.5 cm portacaval node, mildly abnormally enlarged.  Other: No supplemental non-categorized findings.  Musculoskeletal: Inferior endplate compression fractures at L3 and L4.  IMPRESSION: 1. The first lesion of concern is a fairly vascular mass spanning segments 5 and 4B of the liver with capsule or pseudocapsule appearance, and central enhancement which spreads peripherally on the later images, and also with associated hepatic capsular retraction. Differential diagnostic considerations for this lesion include peripheral cholangiocarcinoma ; hepatic epitheliod hemangioendothelioma ; hepatocellular carcinoma ; or treated metastatic disease. Biopsy is likely  warranted. 2. The second lesion of concern is a 1.2 cm pancreatic body lesion with high T2 and low T1 signal characteristics, questionable attachment to the dorsal pancreatic duct, and some internal septation and probably some faint nodular enhancement internally. The appearance on CT, for example image 62 series 9, is somewhat more concerning for internal enhancement, although measurable enhancement is present on MRI. This could be a small macro cystic tumor with solid component; the primary ductal pancreatic adenocarcinoma with cystic degeneration; were cystic degeneration of an islet cell tumor such as insulinoma or glucagon adenoma. Metastatic disease to the pancreas could possibly appear this way. Intraductal papillary mucinous neoplasm with small solid component could also appear this way. Biopsy, surveillance, or further imaging characterization with nuclear medicine PET-CT may be warranted. 3. Cholelithiasis. 4. Mild biliary dilatation. 5. Mildly enlarged portacaval lymph node. In the setting of the hepatic and pancreatic lesions the appearance raises concern for the possibility of early local metastatic disease. This could also be further characterized at PET-CT.   Electronically Signed   By: Van Clines M.D.   On: 12/02/2014 08:39   Ct Angio Ao+bifem W/cm &/or Wo/cm  12/01/2014  CLINICAL DATA:  69 year old female with left-sided blue toe syndrome  EXAM: CT ANGIOGRAPHY OF ABDOMINAL AORTA WITH ILIOFEMORAL RUNOFF  TECHNIQUE: Multidetector CT imaging of the abdomen, pelvis and lower extremities was performed using the standard protocol during bolus administration of intravenous contrast. Multiplanar CT image reconstructions and MIPs were obtained to evaluate the vascular anatomy.  CONTRAST:  100 mL Isovue 370  COMPARISON:  Abdominal ultrasound 10/06/2011  FINDINGS: VASCULAR  Aorta: Normal caliber abdominal aorta with mild scattered atherosclerotic vascular calcifications. No evidence of dissection  or penetrating ulcer.  Celiac: Conventional hepatic arterial anatomy. Widely patent. No visceral artery aneurysm.  SMA: Widely patent and unremarkable.  Renals: Solitary bilateral renal arteries. No evidence of stenosis or fibromuscular dysplasia.  IMA: Widely patent and unremarkable.  RIGHT Lower Extremity  Inflow: Widely patent.  Minimal atherosclerotic disease.  Outflow: Widely patent and minimally diseased. No significant atherosclerotic plaque or evidence of stenosis.  Runoff: Three-vessel runoff to the ankle.  LEFT lower Extremity  Inflow: Widely patent with minimal atherosclerotic disease and no evidence of stenosis.  Outflow: Mild focal stenosis (40- 50%) SFA stenosis secondary to fibro fatty plaque in the proximal thigh. The remainder of the superficial femoral and popliteal arteries are widely patent without evidence of atherosclerotic plaque or stenosis.  Runoff: Three-vessel runoff to the ankle.  Veins: No focal venous abnormality within the limitations of this non venous phase study.  Review of the MIP images confirms the above findings.  NON-VASCULAR  Lower Chest: The lung bases are clear. Visualized cardiac structures are within normal limits for size. No pericardial effusion. Unremarkable visualized distal thoracic esophagus.  Abdomen: Unremarkable CT appearance of the stomach, duodenum, spleen, and pancreas. Mild adreniform thickening of the left adrenal gland consistent with small adenomas or hyperplasia. The right adrenal gland is unremarkable.  Masslike heterogeneity within the liver involving hepatic segments 5 and 4B. The mass is somewhat a amorphous which makes precise measurements difficult. However, I estimate the mass to be approximately 5.8 x 6.6 cm (best measured on the coronal reformatted images). The mass is heterogeneous with areas of hypoenhancement and arterial enhancement. There is a peripherally enhancing capsule versus pseudo capsule. There is a concave contour defect along the  anterior surface of the liver in the inferior aspect of the mass suggesting an underlying fibrotic component causing subcapsular retraction. Blood vessels course through the abnormality. The liver itself does not demonstrate a cirrhotic morphology. The gallbladder is filled with gallstones. No evidence of gallbladder wall thickening or inflammation. No intra or extrahepatic biliary ductal dilatation. Prominent portal caval station lymph node measures 16 mm in short axis (image 36 series 15).  Tiny (less than 1 cm) low-attenuation lesions in both kidneys are too small for accurate characterization but are statistically highly likely benign cysts. No enhancing renal mass, nephrolithiasis or hydronephrosis. Colonic diverticular disease without CT evidence of active inflammation. Normal appendix in the right lower quadrant. No evidence of bowel obstruction or focal bowel wall thickening. No additional adenopathy or free fluid.  Pelvis: Surgical changes of prior hysterectomy. Unremarkable bladder. No free fluid or suspicious adenopathy.  Bones/Soft Tissues: No acute fracture or aggressive appearing lytic or blastic osseous lesion. Chronic appearing compression fractures involving the inferior endplates of L4 and L3. Multilevel degenerative disc disease and lower lumbar facet arthropathy.  IMPRESSION: VASCULAR  1. Overall, fairly minimal atherosclerotic vascular disease. 2. There is a solitary mild-moderate (40- 50%) focal stenosis of the left superficial femoral artery in the proximal thigh secondary to fibro fatty  atherosclerotic plaque. 3. Otherwise, no evidence of significant stenosis in either lower extremity. Three-vessel runoff is patent to the ankle. NON VASCULAR  1. Heterogeneous mass like lesion in the inferior aspect of the liver primarily involving hepatic segments 5 and 4B measures approximately 5.8 x 6.6 cm. Differential considerations include both benign entities such as a focal nodular hyperplasia, and  malignant neoplasms including both primary hepatocellular carcinoma and cholangiocarcinoma, as well as metastatic disease such as a breast cancer (given suggestion of subcapsular retraction). Recommend further evaluation with MRI with and without gadolinium. 2. Cholelithiasis. 3. Colonic diverticular disease without CT evidence of active inflammation. 4. Adreniform thickening of the left adrenal gland may represent small adenomas or adrenal hyperplasia. 5. Chronic appearing compression fractures at L3 and L4. 6. Multilevel degenerative disc disease and lower lumbar facet arthropathy.  Signed,  Criselda Peaches, MD  [facility]  [facility]   Electronically Signed   By: Jacqulynn Cadet M.D.   On: 12/01/2014 11:08    Labs:  CBC:  Recent Labs  12/11/14 1234 12/19/14 1120  WBC 9.1 9.4  HGB 14.8 14.7  HCT 44.5 43.2  PLT 310 305    COAGS:  Recent Labs  12/19/14 1120  INR 0.99  APTT 33    BMP:  Recent Labs  12/11/14 1234  NA 137  K 4.3  CO2 30*  GLUCOSE 97  BUN 9.1  CALCIUM 9.7  CREATININE 0.7    LIVER FUNCTION TESTS:  Recent Labs  12/11/14 1234  BILITOT 0.49  AST 26  ALT 23  ALKPHOS 117  PROT 7.4  ALBUMIN 4.3    TUMOR MARKERS:  Recent Labs  12/11/14 1234  AFPTM 15.7*  CEA 3.6  CA199 11.2    Assessment and Plan: Liver lesion For US guided biopsy Labs reviewed, ok Risks and Benefits discussed with the patient including, but not limited to bleeding, infection, damage to adjacent structures or low yield requiring additional tests. All of the patient's questions were answered, patient is agreeable to proceed. Consent signed and in chart.    SignedAscencion Dike 12/19/2014, 12:55 PM   I spent a total of  15 Minutes  in face to face in clinical consultation, greater than 50% of which was counseling/coordinating care for liver lesion biopsy

## 2014-12-20 DIAGNOSIS — C229 Malignant neoplasm of liver, not specified as primary or secondary: Secondary | ICD-10-CM

## 2014-12-20 HISTORY — DX: Malignant neoplasm of liver, not specified as primary or secondary: C22.9

## 2014-12-24 ENCOUNTER — Telehealth: Payer: Self-pay | Admitting: Hematology

## 2014-12-24 DIAGNOSIS — C220 Liver cell carcinoma: Secondary | ICD-10-CM

## 2014-12-24 NOTE — Telephone Encounter (Signed)
CT scan approved w/auth #[id number] and expires 02/07/15. Scheduled CT for 10/6 at 1015/1030 at [facility]. Instructions for last solid food at 0630 and clear liquids afterwards till scan completed. Called patient/daughter who agree to have scan tomorrow.Instructions reviewed.

## 2014-12-24 NOTE — Telephone Encounter (Signed)
I called patient and discussed the biopsy result.

## 2014-12-24 NOTE — Telephone Encounter (Signed)
Spoke with patient and daughter, [name]. Her case was discussed in [facility] [place] today and Dr. Barry Dienes has agreed to see her Friday at 0830/arrive at [place] at [facility]. CT scan has been ordered and are awaiting PA to be able to schedule. Patient asking how much her insurance will cover this and directed her to call her insurance company for this information.  Faxed demographic information to [facility] for registration.

## 2014-12-25 ENCOUNTER — Ambulatory Visit (HOSPITAL_COMMUNITY)
Admission: RE | Admit: 2014-12-25 | Discharge: 2014-12-25 | Disposition: A | Discharge status: Home / Self Care | Payer: Medicare Other | Source: Ambulatory Visit | Attending: Hematology | Admitting: Hematology

## 2014-12-25 ENCOUNTER — Encounter (HOSPITAL_COMMUNITY): Payer: Self-pay

## 2014-12-25 DIAGNOSIS — J432 Centrilobular emphysema: Secondary | ICD-10-CM | POA: Diagnosis not present

## 2014-12-25 DIAGNOSIS — S2232XA Fracture of one rib, left side, initial encounter for closed fracture: Secondary | ICD-10-CM | POA: Diagnosis not present

## 2014-12-25 DIAGNOSIS — C220 Liver cell carcinoma: Secondary | ICD-10-CM | POA: Diagnosis not present

## 2014-12-25 DIAGNOSIS — R911 Solitary pulmonary nodule: Secondary | ICD-10-CM | POA: Insufficient documentation

## 2014-12-25 DIAGNOSIS — X58XXXA Exposure to other specified factors, initial encounter: Secondary | ICD-10-CM | POA: Diagnosis not present

## 2014-12-25 DIAGNOSIS — E041 Nontoxic single thyroid nodule: Secondary | ICD-10-CM | POA: Insufficient documentation

## 2014-12-25 MED ORDER — IOHEXOL 300 MG/ML  SOLN
75.0000 mL | Freq: Once | INTRAMUSCULAR | Status: AC | PRN
  Administered 2014-12-25: 75 mL via INTRAVENOUS

## 2014-12-26 ENCOUNTER — Other Ambulatory Visit: Payer: Self-pay | Admitting: General Surgery

## 2014-12-29 ENCOUNTER — Other Ambulatory Visit: Payer: Self-pay | Admitting: General Surgery

## 2014-12-29 DIAGNOSIS — C220 Liver cell carcinoma: Secondary | ICD-10-CM

## 2014-12-30 ENCOUNTER — Encounter: Payer: Self-pay | Admitting: Hematology

## 2014-12-30 ENCOUNTER — Ambulatory Visit
Admission: RE | Admit: 2014-12-30 | Discharge: 2014-12-30 | Disposition: A | Discharge status: Home / Self Care | Payer: Medicare Other | Source: Ambulatory Visit | Attending: General Surgery | Admitting: General Surgery

## 2014-12-30 ENCOUNTER — Telehealth: Payer: Self-pay | Admitting: Hematology

## 2014-12-30 ENCOUNTER — Ambulatory Visit (HOSPITAL_BASED_OUTPATIENT_CLINIC_OR_DEPARTMENT_OTHER): Payer: Medicare Other | Admitting: Hematology

## 2014-12-30 DIAGNOSIS — C220 Liver cell carcinoma: Secondary | ICD-10-CM | POA: Diagnosis not present

## 2014-12-30 DIAGNOSIS — K869 Disease of pancreas, unspecified: Secondary | ICD-10-CM

## 2014-12-30 NOTE — Progress Notes (Signed)
[facility]  [phone] [phone]  Clinic follow Up Note   Patient Care Team: Orpah Melter, MD as PCP - General (Family Medicine) Elam Dutch, MD as Consulting Physician (Vascular Surgery) 12/30/2014   CHIEF COMPLAINTS:  Hepatocellular carcinoma  Oncology History   Hepatocellular carcinoma ([place])   Staging form: Liver (Excluding Intrahepatic Bile Ducts), AJCC 7th Edition     Clinical stage from 12/30/2014: Stage Unknown (T1, NX, M0) - Unsigned       Hepatocellular carcinoma ([place])   12/02/2014 Imaging Liver MRI showed a large lesion in segment 5 and 40, highly suspicious for malignancy. And a 1.2 cm indeterminate pancreatic body lesion. Mildly enlarged portocaval lymph node. Cholelithiasis.   12/19/2014 Initial Diagnosis Hepatocellular carcinoma ([place])   12/19/2014 Initial Biopsy Liver mass biopsy showed parathyroid carcinoma, positive for hep par 1, cytokeratin 8/18, cytokeratin 7 and MOC-31.   12/25/2014 Imaging CT chest showed a 3 mm groundglass probably nodule in the right upper lobe, mild right pericardial phrenic lymphadenopathy, no definitive evidence of metastasis.   HISTORY OF PRESENTING ILLNESS:  Tina Bailey 69 y.o. female is here because of recently discovered liver mass.  She has had abdominal pain after meals for the past 4 years, occurs once every several months, usually resolves after 20-30 mins on its own. She was seen by her PCP, had [place] and her symptoms was felt to be related to her gall bladder stone.   She has had some pressure feeling under right ribcage, which radiates to back, for the past 8-9 months, and slight decrease her appetite, and fatigue, she lost about 10 lbs.   She went to see Dr. Oneida Alar for left 5th toes color change (blue), no claudication, she underwent CT angiogram which incidentally showed a large liver mass and a 1.2 cm pancreatic cystic lesion. She was referred to [place] for further management.   She  never had colonoscopy. She denies melena, and she is here, change of her bowel habit. Se lives with her daughter. She has extensive smoking history, no family history of malignancy.  INTERIM HISTROY Ashling returns for follow-up. She feels well overall, denies any new complaints since I saw her last time. She was seen by surgeon Dr. Barry Dienes last week and the surgical resection of her liver cancer was offered. She was also seen by interventional radiologist Dr. Anselm Pancoast today for portal vein embolization.   MEDICAL HISTORY:  Past Medical History  Diagnosis Date  . COPD (chronic obstructive pulmonary disease) ([place])   . Productive cough   . Wears glasses   . Arthritis     DDD  . Fibromyalgia   . Retina disorder     L eye, vision distorted, edema  . Macular degeneration     R eye  . Blue toe syndrome ([place]) 11/27/2014  . Osteoporosis   . Spine fracture due to birth trauma     SURGICAL HISTORY: Past Surgical History  Procedure Laterality Date  . Partial hysterectomy    . Kidney stone surgery    . Angioplasty  2008    no stents required, no follow-up with cardiologist, no recurrent chest pain  . Eye surgery      SOCIAL HISTORY: Social History   Social History  . Marital Status: Divorced    Spouse Name: N/A  . Number of Children: 3   . Years of Education: N/A   Occupational History  . 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 Smoking status: Current Every Day Smoker -- 1.00 packs/day for 45 years    Types: Cigarettes  . Smokeless tobacco: Never Used  . Alcohol Use: No  . Drug Use: No  . Sexual Activity: Not on file   Other Topics Concern  . Not on file   Social History Narrative    FAMILY HISTORY: Family History  Problem Relation Age of Onset  . Hypertension Mother   . Atrial fibrillation Mother   . Heart disease Mother     after age 15  . Stroke Father   . Dementia Father   . Heart disease Brother     After age 30- A-Fib  .  Heart attack Brother     ALLERGIES:  is allergic to paroxetine hcl and sulfa antibiotics.  MEDICATIONS:  Current Outpatient Prescriptions  Medication Sig Dispense Refill  . aspirin EC 81 MG tablet Take 1 tablet (81 mg total) by mouth daily. (Patient not taking: Reported on 12/30/2014) 150 tablet 2  . aspirin-acetaminophen-caffeine (EXCEDRIN MIGRAINE) [phone] MG per tablet Take by mouth every 6 (six) hours as needed for headache.    . clopidogrel (PLAVIX) 75 MG tablet Take 1 tablet (75 mg total) by mouth daily. (Patient not taking: Reported on 12/30/2014) 30 tablet 6  . ibuprofen (ADVIL,MOTRIN) 200 MG tablet Take 200 mg by mouth every 6 (six) hours as needed for moderate pain.     No current facility-administered medications for this visit.    REVIEW OF SYSTEMS:   Constitutional: Denies fevers, chills or abnormal night sweats, (+) fatigue and 10 lb weight loss  Eyes: Denies blurriness of vision, double vision or watery eyes Ears, nose, mouth, throat, and face: Denies mucositis or sore throat Respiratory: Denies cough, dyspnea or wheezes Cardiovascular: Denies palpitation, chest discomfort or lower extremity swelling Gastrointestinal:  Denies nausea, heartburn or change in bowel habits, se HPI  Skin: Denies abnormal skin rashes Lymphatics: Denies new lymphadenopathy or easy bruising Neurological:Denies numbness, tingling or new weaknesses Behavioral/Psych: Mood is stable, no new changes  All other systems were reviewed with the patient and are negative.  PHYSICAL EXAMINATION: ECOG PERFORMANCE STATUS: 1 - Symptomatic but completely ambulatory  Filed Vitals:   12/30/14 1500  BP: 118/55  Pulse: 93  Temp: 97.8 F (36.6 C)  Resp: 18   Filed Weights   12/30/14 1500  Weight: 115 lb (52.164 kg)    GENERAL:alert, no distress and comfortable SKIN: skin color, texture, turgor are normal, no rashes or significant lesions EYES: normal, conjunctiva are pink and non-injected, sclera  clear OROPHARYNX:no exudate, no erythema and lips, buccal mucosa, and tongue normal  NECK: supple, thyroid normal size, non-tender, without nodularity LYMPH:  no palpable lymphadenopathy in the cervical, axillary or inguinal LUNGS: clear to auscultation and percussion with normal breathing effort HEART: regular rate & rhythm and no murmurs and no lower extremity edema ABDOMEN:abdomen soft, non-tender and normal bowel sounds Musculoskeletal:no cyanosis of digits and no clubbing  PSYCH: alert & oriented x 3 with fluent speech NEURO: no focal motor/sensory deficits  LABORATORY DATA:  I have reviewed the data as listed CBC Latest Ref Rng 12/19/2014 12/11/2014 01/19/2011  WBC 4.0 - 10.5 K/uL 9.4 9.1 9.9  Hemoglobin 12.0 - 15.0 g/dL 14.7 14.8 14.0  Hematocrit 36.0 - 46.0 % 43.2 44.5 42.6  Platelets 150 - 400 K/uL 305 310 307    CMP Latest Ref Rng 12/11/2014 01/19/2011  Glucose 70 - 140 mg/dl 97 97  BUN 7.0 - 26.0  mg/dL 9.1 7  Creatinine 0.6 - 1.1 mg/dL 0.7 0.61  Sodium 136 - 145 mEq/L 137 136  Potassium 3.5 - 5.1 mEq/L 4.3 4.1  Chloride 96 - 112 mEq/L - 99  CO2 22 - 29 mEq/L 30(H) 29  Calcium 8.4 - 10.4 mg/dL 9.7 9.6  Total Protein 6.4 - 8.3 g/dL 7.4 -  Total Bilirubin 0.20 - 1.20 mg/dL 0.49 -  Alkaline Phos 40 - 150 U/L 117 -  AST 5 - 34 U/L 26 -  ALT 0 - 55 U/L 23 -     AFP tumor marker  Status: Finalresult Visible to patient:  Not Released Nextappt: Today at 11:00 AM in Radiology ([facility]) Dx:  Liver mass            Ref Range 2wk ago    AFP-Tumor Marker <6.1 ng/mL 15.7 (H)         PATHOLOGY REPORT  Diagnosis 12/19/2014 Liver, needle/core biopsy - HEPATOCELLULAR CARCINOMA. - SEE MICROSCOPIC DESCRIPTION. Microscopic Comment The core biopsies are involved by carcinoma with hepatoid features. The tumor is positive with Hep Par 1, cytokeratin 8/18, cytokeratin 7, and MOC-31. The tumor is negative with cytokeratin 20, CDX2, thyroid transcription  factor-1, Napsin A, estrogen receptor and gross cystic disease fluid protein. The morphology and immunophenotype are consistent with hepatocellular carcinoma. Dr. Donato Heinz has reviewed this case and agrees.  RADIOGRAPHIC STUDIES: I have personally reviewed the radiological images as listed and agreed with the findings in the report.  Mr Liver W Wo Contrast 12/02/2014   IMPRESSION: 1. The first lesion of concern is a fairly vascular mass spanning segments 5 and 4B of the liver with capsule or pseudocapsule appearance, and central enhancement which spreads peripherally on the later images, and also with associated hepatic capsular retraction. Differential diagnostic considerations for this lesion include peripheral cholangiocarcinoma ; hepatic epitheliod hemangioendothelioma ; hepatocellular carcinoma ; or treated metastatic disease. Biopsy is likely warranted. 2. The second lesion of concern is a 1.2 cm pancreatic body lesion with high T2 and low T1 signal characteristics, questionable attachment to the dorsal pancreatic duct, and some internal septation and probably some faint nodular enhancement internally. The appearance on CT, for example image 62 series 9, is somewhat more concerning for internal enhancement, although measurable enhancement is present on MRI. This could be a small macro cystic tumor with solid component; the primary ductal pancreatic adenocarcinoma with cystic degeneration; were cystic degeneration of an islet cell tumor such as insulinoma or glucagon adenoma. Metastatic disease to the pancreas could possibly appear this way. Intraductal papillary mucinous neoplasm with small solid component could also appear this way. Biopsy, surveillance, or further imaging characterization with nuclear medicine PET-CT may be warranted. 3. Cholelithiasis. 4. Mild biliary dilatation. 5. Mildly enlarged portacaval lymph node. In the setting of the hepatic and pancreatic lesions the appearance raises concern  for the possibility of early local metastatic disease. This could also be further characterized at PET-CT.   Electronically Signed   By: Van Clines M.D.   On: 12/02/2014 08:39   CT chest w contrast 12/25/2014 IMPRESSION: 1. Solitary 3 mm ground-glass pulmonary nodule in the right upper lobe. Recommend attention on follow-up chest CT in 3 months. 2. Mild right pericardiophrenic lymphadenopathy, possibly metastatic. 3. Mild centrilobular emphysema. 4. Nondisplaced healing subacute lateral left fifth rib fracture. 5. Right thyroid lobe 1.8 cm nodule. Recommend correlation with thyroid ultrasound. This follows ACR consensus guidelines: Managing Incidental Thyroid Nodules Detected on Imaging: White Paper of the ACR Incidental  Thyroid Findings Committee. J Am Coll Radiol 2015; 12:143-150   ASSESSMENT & PLAN: 69 year old African-American female, with 45-pack-year smoking history, presented with incidental scan finding of a large peripheral liver lesion in the right lobe segmental 5 and a 4B, and a small pancreatic cystic lesion.  1. Hepatocellular carcinoma, [id number] -I reviewed her liver mass biopsy pathology results, it confirmed [place]. She also has elevated AFP. -Noticed definitive evidence of distant metastasis, surgical resection was offered by Dr. Barry Dienes. She will have portal vein embolization first by interventional radiologist Dr. Anselm Pancoast -I discussed her CT chest results. Mild right pericardial phrenic lymphadenopathy are not very concerned for metastasis to me. -If she has complete surgical resection, no adjuvant therapy is needed.  -We discussed the surveillance for cancer recurrence after her surgery.   2. Pancreatic lesion -Abdominal MRI showed a 1.2 cm pancreatic body lesion, likely cystic with some solid component.  -Her imaging was reviewed in our tumor yesterday, radiologist feels they pancreatic lesion is less likely malignancy. -We'll follow up clinically. Dr. Barry Dienes may  consider biopsy during her surgery.  3. COPD  -She will continue to follow-up with her primary care physician  4. Smoking cessation -I discussed the smoking cessation extensively. She is willing to cut back, not ready for crit completely yet.  Plan -I'll see her back in 3 months for follow-up.  All questions were answered. The patient knows to call the clinic with any problems, questions or concerns. I spent 25 minutes counseling the patient face to face. The total time spent in the appointment was 30 minutes and more than 50% was on counseling.     Truitt Merle, MD 12/30/2014 3:55 PM

## 2014-12-30 NOTE — Consult Note (Signed)
Chief Complaint: Patient was seen in consultation today for  Chief Complaint  Patient presents with  . Advice Only    Consult for Portal Vein Embo/Hepatocellular Carcinoma   at the request of [place]  Referring Physician(s): Byerly,Faera  History of Present Illness: Tina Bailey is a 69 y.o. female with a recently discovered liver lesion during worked up for peripheral vascular disease. The patient had discoloration and pain in her left fifth toe which has resolved. The CT angiogram demonstrated a large liver lesion measuring up to 6.6 cm. This lesion has undergone an ultrasound-guided biopsy, demonstrating hepatocellular carcinoma. The patient recently saw Dr. Barry Dienes for discussion of tumor resection. The patient presents with her son-in-law. Patient says that she has had long-standing abdominal pain off and on. Sometimes abdominal pain is associated with eating. She says that she has lost approximately 10-15 pounds in the last few months and related to poor appetite. The left toe symptoms have resolved and she denies any claudication symptoms. Patient is a longtime smoker but denies any significant respiratory issues. Patient does mention frequent tiredness and she says that fatigue does limit some of her activities. She does complain of frequent back pain.  Past Medical History  Diagnosis Date  . COPD (chronic obstructive pulmonary disease) ([place])   . Productive cough   . Wears glasses   . Arthritis     DDD  . Fibromyalgia   . Retina disorder     L eye, vision distorted, edema  . Macular degeneration     R eye  . Blue toe syndrome ([place]) 11/27/2014  . Osteoporosis   . Spine fracture due to birth trauma     Past Surgical History  Procedure Laterality Date  . Partial hysterectomy    . Kidney stone surgery    . Angioplasty  2008    no stents required, no follow-up with cardiologist, no recurrent chest pain  . Eye surgery      Allergies: Paroxetine hcl and Sulfa  antibiotics  Medications: Prior to Admission medications   Medication Sig Start Date End Date Taking? Authorizing Provider  aspirin EC 81 MG tablet Take 1 tablet (81 mg total) by mouth daily. Patient not taking: Reported on 12/30/2014 11/27/14   Elam Dutch, MD  aspirin-acetaminophen-caffeine [facility] MIGRAINE) [phone] MG per tablet Take by mouth every 6 (six) hours as needed for headache.    Historical Provider, MD  clopidogrel (PLAVIX) 75 MG tablet Take 1 tablet (75 mg total) by mouth daily. Patient not taking: Reported on 12/30/2014 12/15/14   Elam Dutch, MD  ibuprofen (ADVIL,MOTRIN) 200 MG tablet Take 200 mg by mouth every 6 (six) hours as needed for moderate pain.    Historical Provider, MD     Family History  Problem Relation Age of Onset  . Hypertension Mother   . Atrial fibrillation Mother   . Heart disease Mother     after age 24  . Stroke Father   . Dementia Father   . Heart disease Brother     After age 34- A-Fib  . Heart attack Brother     Social History   Social History  . Marital Status: Divorced    Spouse Name: N/A  . Number of Children: N/A  . Years of Education: N/A   Social History Main Topics  . Smoking status: Current Every Day Smoker -- 1.00 packs/day for 45 years    Types: Cigarettes  . Smokeless tobacco: Never Used  . Alcohol Use:  No  . Drug Use: No  . Sexual Activity: Not Asked   Other Topics Concern  . None   Social History Narrative    ECOG Status: 1 - Symptomatic but completely ambulatory   Review of Systems  Constitutional: Positive for activity change, appetite change and fatigue.  Respiratory: Negative.   Cardiovascular: Negative.   Gastrointestinal: Positive for abdominal pain.  Genitourinary: Negative.   Musculoskeletal: Positive for back pain.    Vital Signs: BP 125/63 mmHg  Pulse 84  Temp(Src) 98.1 F (36.7 C) (Oral)  Resp 14  Ht 5\' 5"  (1.651 m)  Wt 114 lb (51.71 kg)  BMI 18.97 kg/m2  SpO2  98%  Physical Exam  Constitutional: She is oriented to person, place, and time. She appears well-developed and well-nourished.  Cardiovascular: Normal rate and regular rhythm.   Strong right PT pulse. Faint left PT pulse.  Pulmonary/Chest: Effort normal and breath sounds normal. No respiratory distress.  Abdominal: Soft. Bowel sounds are normal. There is tenderness.  Tenderness with palpation in RUQ.  Musculoskeletal: She exhibits no edema.  Neurological: She is alert and oriented to person, place, and time.        Imaging: Ct Chest W Contrast  12/25/2014   CLINICAL DATA:  Newly diagnosed hepatocellular carcinoma presenting for chest staging. Cough, shortness of breath on exertion and 50 year smoking history.  EXAM: CT CHEST WITH CONTRAST  TECHNIQUE: Multidetector CT imaging of the chest was performed during intravenous contrast administration.  CONTRAST:  58mL OMNIPAQUE IOHEXOL 300 MG/ML  SOLN  COMPARISON:  12/02/2014 liver MRI.  12/01/2014  FINDINGS: Mediastinum/Nodes: Normal heart size. No pericardial fluid/thickening. Great vessels are normal in course and caliber. No central pulmonary emboli. Hypodense 1.8 cm posterior right thyroid lobe nodule (series 2/image 4). Normal esophagus. No axillary or hilar lymphadenopathy. There is a mildly enlarged 0.7 cm short axis right pericardiophrenic node (series 2/ image 44). Otherwise no mediastinal lymphadenopathy.  Lungs/Pleura: No pneumothorax. No pleural effusion. There is layering slightly frothy material in the dependent lower tracheal lumen. Mild centrilobular emphysema. There is a 0.3 cm ground-glass right upper lobe pulmonary nodule (series 5/image 13). No additional significant pulmonary nodules or lung masses. There is mild symmetric pleural-parenchymal scarring in the dependent apical upper lobes. No acute consolidative airspace disease.  Upper abdomen: Re- demonstrated is a transient perfusional hyperenhancement in the anterior subcapsular  segment 8 of the right liver lobe related to the known segments 4/5 hepatocellular carcinoma. Stable mild irregular thickening of the left adrenal gland without discrete left adrenal nodule.  Musculoskeletal: No aggressive appearing focal osseous lesions. Moderate degenerative changes in the thoracic spine. Subacute nondisplaced healing lateral left fifth rib fracture.  IMPRESSION: 1. Solitary 3 mm ground-glass pulmonary nodule in the right upper lobe. Recommend attention on follow-up chest CT in 3 months. 2. Mild right pericardiophrenic lymphadenopathy, possibly metastatic. 3. Mild centrilobular emphysema. 4. Nondisplaced healing subacute lateral left fifth rib fracture. 5. Right thyroid lobe 1.8 cm nodule. Recommend correlation with thyroid ultrasound. This follows ACR consensus guidelines: Managing Incidental Thyroid Nodules Detected on Imaging: White Paper of the ACR Incidental Thyroid Findings Committee. J Am Coll Radiol 2015; 12:143-150.   Electronically Signed   By: Ilona Sorrel M.D.   On: 12/25/2014 13:44   Mr Liver W Wo Contrast  12/02/2014   CLINICAL DATA:  Liver mass discovered 12/01/2014  EXAM: MRI ABDOMEN WITHOUT AND WITH CONTRAST  TECHNIQUE: Multiplanar multisequence MR imaging of the abdomen was performed both before and after the administration  of intravenous contrast.  CONTRAST:  42mL MULTIHANCE GADOBENATE DIMEGLUMINE 529 MG/ML IV SOLN  COMPARISON:  12/01/2014  FINDINGS: Lower chest:  Unremarkable  Hepatobiliary: Is liver mass spanning segments 4B and 5 measures 6.8 by 4.1 by 5.6 cm, with moderately high T2 signal characteristics compared to hepatic parenchyma, and low T1 signal characteristics. No fatty component. Capsular retraction noted. On arterial phase images there are prominent vessels within the mass along with a faint blush of enhancement which intensifies centrally on portal venous phase images and slowly extends peripherally. Capsule or pseudo capsule appearance as on image 66 series  15. Delayed phase images demonstrated a moderate delayed blush of enhancement. No other liver lesions identified. Numerous gallstones nearly entirely fill the gallbladder. Common bile duct 8 mm diameter, mildly dilated for age, but with expected distal conical tapering.  Pancreas: 1.2 cm lesion of the pancreatic body, possible cystic component based on high T2 signal, but suspicion for internal septation and probably some internal enhancement.  Spleen: Unremarkable  Adrenals/Urinary Tract: Several tiny hypointense lesions in both kidneys are likely cysts although technically too small to characterize. No worrisome enhancing renal lesion is seen.  Stomach/Bowel: Unremarkable  Vascular/Lymphatic: 1.5 cm portacaval node, mildly abnormally enlarged.  Other: No supplemental non-categorized findings.  Musculoskeletal: Inferior endplate compression fractures at L3 and L4.  IMPRESSION: 1. The first lesion of concern is a fairly vascular mass spanning segments 5 and 4B of the liver with capsule or pseudocapsule appearance, and central enhancement which spreads peripherally on the later images, and also with associated hepatic capsular retraction. Differential diagnostic considerations for this lesion include peripheral cholangiocarcinoma ; hepatic epitheliod hemangioendothelioma ; hepatocellular carcinoma ; or treated metastatic disease. Biopsy is likely warranted. 2. The second lesion of concern is a 1.2 cm pancreatic body lesion with high T2 and low T1 signal characteristics, questionable attachment to the dorsal pancreatic duct, and some internal septation and probably some faint nodular enhancement internally. The appearance on CT, for example image 62 series 9, is somewhat more concerning for internal enhancement, although measurable enhancement is present on MRI. This could be a small macro cystic tumor with solid component; the primary ductal pancreatic adenocarcinoma with cystic degeneration; were cystic degeneration  of an islet cell tumor such as insulinoma or glucagon adenoma. Metastatic disease to the pancreas could possibly appear this way. Intraductal papillary mucinous neoplasm with small solid component could also appear this way. Biopsy, surveillance, or further imaging characterization with nuclear medicine PET-CT may be warranted. 3. Cholelithiasis. 4. Mild biliary dilatation. 5. Mildly enlarged portacaval lymph node. In the setting of the hepatic and pancreatic lesions the appearance raises concern for the possibility of early local metastatic disease. This could also be further characterized at PET-CT.   Electronically Signed   By: Van Clines M.D.   On: 12/02/2014 08:39   Ct Angio Ao+bifem W/cm &/or Wo/cm  12/01/2014   CLINICAL DATA:  69 year old female with left-sided blue toe syndrome  EXAM: CT ANGIOGRAPHY OF ABDOMINAL AORTA WITH ILIOFEMORAL RUNOFF  TECHNIQUE: Multidetector CT imaging of the abdomen, pelvis and lower extremities was performed using the standard protocol during bolus administration of intravenous contrast. Multiplanar CT image reconstructions and MIPs were obtained to evaluate the vascular anatomy.  CONTRAST:  100 mL Isovue 370  COMPARISON:  Abdominal ultrasound 10/06/2011  FINDINGS: VASCULAR  Aorta: Normal caliber abdominal aorta with mild scattered atherosclerotic vascular calcifications. No evidence of dissection or penetrating ulcer.  Celiac: Conventional hepatic arterial anatomy. Widely patent. No visceral artery aneurysm.  SMA: Widely patent and unremarkable.  Renals: Solitary bilateral renal arteries. No evidence of stenosis or fibromuscular dysplasia.  IMA: Widely patent and unremarkable.  RIGHT Lower Extremity  Inflow: Widely patent.  Minimal atherosclerotic disease.  Outflow: Widely patent and minimally diseased. No significant atherosclerotic plaque or evidence of stenosis.  Runoff: Three-vessel runoff to the ankle.  LEFT lower Extremity  Inflow: Widely patent with minimal  atherosclerotic disease and no evidence of stenosis.  Outflow: Mild focal stenosis (40- 50%) SFA stenosis secondary to fibro fatty plaque in the proximal thigh. The remainder of the superficial femoral and popliteal arteries are widely patent without evidence of atherosclerotic plaque or stenosis.  Runoff: Three-vessel runoff to the ankle.  Veins: No focal venous abnormality within the limitations of this non venous phase study.  Review of the MIP images confirms the above findings.  NON-VASCULAR  Lower Chest: The lung bases are clear. Visualized cardiac structures are within normal limits for size. No pericardial effusion. Unremarkable visualized distal thoracic esophagus.  Abdomen: Unremarkable CT appearance of the stomach, duodenum, spleen, and pancreas. Mild adreniform thickening of the left adrenal gland consistent with small adenomas or hyperplasia. The right adrenal gland is unremarkable.  Masslike heterogeneity within the liver involving hepatic segments 5 and 4B. The mass is somewhat a amorphous which makes precise measurements difficult. However, I estimate the mass to be approximately 5.8 x 6.6 cm (best measured on the coronal reformatted images). The mass is heterogeneous with areas of hypoenhancement and arterial enhancement. There is a peripherally enhancing capsule versus pseudo capsule. There is a concave contour defect along the anterior surface of the liver in the inferior aspect of the mass suggesting an underlying fibrotic component causing subcapsular retraction. Blood vessels course through the abnormality. The liver itself does not demonstrate a cirrhotic morphology. The gallbladder is filled with gallstones. No evidence of gallbladder wall thickening or inflammation. No intra or extrahepatic biliary ductal dilatation. Prominent portal caval station lymph node measures 16 mm in short axis (image 36 series 15).  Tiny (less than 1 cm) low-attenuation lesions in both kidneys are too small for  accurate characterization but are statistically highly likely benign cysts. No enhancing renal mass, nephrolithiasis or hydronephrosis. Colonic diverticular disease without CT evidence of active inflammation. Normal appendix in the right lower quadrant. No evidence of bowel obstruction or focal bowel wall thickening. No additional adenopathy or free fluid.  Pelvis: Surgical changes of prior hysterectomy. Unremarkable bladder. No free fluid or suspicious adenopathy.  Bones/Soft Tissues: No acute fracture or aggressive appearing lytic or blastic osseous lesion. Chronic appearing compression fractures involving the inferior endplates of L4 and L3. Multilevel degenerative disc disease and lower lumbar facet arthropathy.  IMPRESSION: VASCULAR  1. Overall, fairly minimal atherosclerotic vascular disease. 2. There is a solitary mild-moderate (40- 50%) focal stenosis of the left superficial femoral artery in the proximal thigh secondary to fibro fatty atherosclerotic plaque. 3. Otherwise, no evidence of significant stenosis in either lower extremity. Three-vessel runoff is patent to the ankle. NON VASCULAR  1. Heterogeneous mass like lesion in the inferior aspect of the liver primarily involving hepatic segments 5 and 4B measures approximately 5.8 x 6.6 cm. Differential considerations include both benign entities such as a focal nodular hyperplasia, and malignant neoplasms including both primary hepatocellular carcinoma and cholangiocarcinoma, as well as metastatic disease such as a breast cancer (given suggestion of subcapsular retraction). Recommend further evaluation with MRI with and without gadolinium. 2. Cholelithiasis. 3. Colonic diverticular disease without CT evidence of active  inflammation. 4. Adreniform thickening of the left adrenal gland may represent small adenomas or adrenal hyperplasia. 5. Chronic appearing compression fractures at L3 and L4. 6. Multilevel degenerative disc disease and lower lumbar facet  arthropathy.  Signed,  Criselda Peaches, MD  [facility]  [facility]   Electronically Signed   By: Jacqulynn Cadet M.D.   On: 12/01/2014 11:08   US Biopsy  12/19/2014   INDICATION: 69 year old female with a history of liver mass. Differential diagnosis includes both cholangiocarcinoma and hepatocellular carcinoma.  EXAM: ULTRASOUND GUIDED LIVER BIOPSY  COMPARISON:  MR 12/02/2014, CT 12/01/2014  MEDICATIONS: Fentanyl 50 mcg IV; Versed 1.5 mg IV  ANESTHESIA/SEDATION: Total Moderate Sedation time  Twelve minutes  COMPLICATIONS: None  PROCEDURE: Informed written consent was obtained from the patient after a discussion of the risks, benefits and alternatives to treatment. The patient understands and consents the procedure. A timeout was performed prior to the initiation of the procedure.  Ultrasound survey of the right upper quadrant was performed with images stored and sent to PACs.  Once the patient is prepped and draped in the usual sterile fashion, ultrasound guidance was used to infiltrate the skin and subcutaneous tissues with 1% lidocaine without epinephrine. A small stab incision was made with 11 blade scalpel, and using ultrasound guidance, 17 gauge needle was advanced into mass of segment 4B/5.  Three separate 18 gauge core biopsy were retrieved.  Gel-Foam pledgets were infused with a small amount of contrast.  Final image was stored.  Patient tolerated the procedure well and remained hemodynamically stable throughout.  No complications were encountered and no significant blood loss was encountered.  FINDINGS: Ultrasound survey demonstrates heterogeneous mass of the right liver lobe and extending into segment 4B.  Images during the case demonstrate needle tip within the mass.  Final image demonstrates no complicating features, with expected gas from Gel-Foam pledgets.  IMPRESSION: Status post ultrasound-guided biopsy of liver mass, with tissue specimen  sent to pathology for complete histopathologic analysis.  Signed,  Dulcy Fanny. Earleen Newport, DO  [facility]  [facility]   Electronically Signed   By: Corrie Mckusick D.O.   On: 12/19/2014 14:04    Labs:  CBC:  Recent Labs  12/11/14 1234 12/19/14 1120  WBC 9.1 9.4  HGB 14.8 14.7  HCT 44.5 43.2  PLT 310 305    COAGS:  Recent Labs  12/19/14 1120  INR 0.99  APTT 33    BMP:  Recent Labs  12/11/14 1234  NA 137  K 4.3  CO2 30*  GLUCOSE 97  BUN 9.1  CALCIUM 9.7  CREATININE 0.7    LIVER FUNCTION TESTS:  Recent Labs  12/11/14 1234  BILITOT 0.49  AST 26  ALT 23  ALKPHOS 117  PROT 7.4  ALBUMIN 4.3    TUMOR MARKERS:  Recent Labs  12/11/14 1234  AFPTM 15.7*  CEA 3.6  CA199 11.2    Assessment :  69 year old with biopsy-proven hepatocellular carcinoma. Patient has already been consulted for surgical resection and right hepatectomy. Dr. Cher Nakai asked me to discuss treatment options with the patient including chemoembolization and portal vein embolization prior to surgical resection. I discussed catheter directed treatment options including bland embolization, chemoembolization and radioembolization. Patient understands that these transcatheter approaches are typically palliative. The patient would not be a candidate for image guided thermal ablation due to the size of this lesion.  At this time, patient wants to pursue tumor resection.  Large  solitary HCC involving segments 5 and 4B. Lesion measures up to 6.6 cm. According to Dr. Cher Nakai, she is planning on an extended right hepatectomy. I explained the rationale for a portal vein embolization prior to liver resection. We discussed how the therapy redirects portal blood to the segments of the future liver remnant and that results in hypertrophy. She understands that it would require percutaneous access to the liver which has a small risk of bleeding and infection. I discussed  embolization of the right portal venous system and risk of nontarget embolization. I explained that she would require at least one night in the hospital for observation and treatment of post-embolic symptoms. I feel that the patient has a very good understanding of the procedure along with the risks and the benefits.  Plan: Patient is scheduled to get cardiac clearance for the liver surgery and is planning to follow-up with oncology later today. At this time, the patient wants to proceed with surgical resection and we will plan for the portal vein embolization within 1-2 weeks.    Thank you for this interesting consult.  I greatly enjoyed meeting Tina Bailey and look forward to participating in their care.  A copy of this report was sent to the requesting provider on this date.  SignedCarylon Perches 12/30/2014, 12:38 PM   I spent a total of  30 Minutes   in face to face in clinical consultation, greater than 50% of which was counseling/coordinating care for hepatocellular carcinoma.

## 2014-12-30 NOTE — Telephone Encounter (Signed)
Gave adn printed appt sched and avs fo rpt for Jan 2017 °

## 2015-01-01 ENCOUNTER — Telehealth: Payer: Self-pay | Admitting: Cardiovascular Disease

## 2015-01-01 ENCOUNTER — Other Ambulatory Visit: Payer: Self-pay | Admitting: Diagnostic Radiology

## 2015-01-01 DIAGNOSIS — C220 Liver cell carcinoma: Secondary | ICD-10-CM

## 2015-01-01 NOTE — Telephone Encounter (Signed)
Received records from [place] for appointment on 01/09/15 with Dr Oval Linsey.  Records given to [facility] (medical records) for Dr Blenda Mounts schedule on 01/09/15. lp

## 2015-01-05 ENCOUNTER — Other Ambulatory Visit (HOSPITAL_BASED_OUTPATIENT_CLINIC_OR_DEPARTMENT_OTHER): Payer: Self-pay | Admitting: Family Medicine

## 2015-01-05 DIAGNOSIS — E041 Nontoxic single thyroid nodule: Secondary | ICD-10-CM

## 2015-01-07 ENCOUNTER — Ambulatory Visit (HOSPITAL_BASED_OUTPATIENT_CLINIC_OR_DEPARTMENT_OTHER)
Admission: RE | Admit: 2015-01-07 | Discharge: 2015-01-07 | Disposition: A | Discharge status: Home / Self Care | Payer: Medicare Other | Source: Ambulatory Visit | Attending: Family Medicine | Admitting: Family Medicine

## 2015-01-07 DIAGNOSIS — E041 Nontoxic single thyroid nodule: Secondary | ICD-10-CM

## 2015-01-07 DIAGNOSIS — E042 Nontoxic multinodular goiter: Secondary | ICD-10-CM | POA: Diagnosis not present

## 2015-01-08 NOTE — Progress Notes (Signed)
Cardiology Office Note   Date:  01/09/2015   ID:  Tina Bailey, DOB [birth year], MRN [id number]  PCP:  Orpah Melter, MD  Cardiologist:   Sharol Harness, MD   Chief Complaint  Patient presents with  . New Evaluation    pt states here for a Lobectomy surgical clearance   . Chest Pain    no chest pain  . Shortness of Breath    no SOB  . Edema    no swelling in legs      History of Present Illness: Tina Bailey is a 69 y.o. female with [place], a pancreatic mass, COPD, fibromyalgia, osteoporosis, and ongoing tobacco abuse who presents for pre-surgical risk assessment.  Tina Bailey is scheduled for surgical resection of her liver cancer with Dr. Barry Dienes.  She is very nervous about her surgery and she iis very nervous about medical professionals in general.    Tina Bailey reports occasional episodes of chest pain that she associates with stress or panic attacks. She does not get much exercise. She is very active but doesn't have any formal exercise. She notes some shortness of breath with exertion that is been ongoing since 2015. That year she had several fractures and became much less physically active. She's starting to get back into better shape but feels that she still has a long wait to go. She denies any Lower extremity edema, orthopnea or PND. She does have a lot of wheezing when she lays down at night. She thinks that her shortness of breath and chest pain are due to panic and her COPD.  Tina Bailey had a heart cath in 2008 due to chest pain.  At the time both her father and brother had recently passed away.  At her cath she was found to have no significant blockages.  LVEF was 65% on LV-gram.  Tina Bailey continues to smoke.  She has been smoking since her 84s. She is decreased, and she smokes 2 half a pack a day from one pack per day. She's been able to cut down to as little as one cigarette daily. She does not want any patches or other medications and thinks that  she will be able to quit on her own.   Past Medical History  Diagnosis Date  . COPD (chronic obstructive pulmonary disease) ([place])   . Productive cough   . Wears glasses   . Arthritis     DDD  . Fibromyalgia   . Retina disorder     L eye, vision distorted, edema  . Macular degeneration     R eye  . Blue toe syndrome ([place]) 11/27/2014  . Osteoporosis   . Spine fracture due to birth trauma     Past Surgical History  Procedure Laterality Date  . Partial hysterectomy    . Kidney stone surgery    . Angioplasty  2008    no stents required, no follow-up with cardiologist, no recurrent chest pain  . Eye surgery       Current Outpatient Prescriptions  Medication Sig Dispense Refill  . aspirin-acetaminophen-caffeine (EXCEDRIN MIGRAINE) 250-250-65 MG per tablet Take 1 tablet by mouth every 6 (six) hours as needed for headache.     . carboxymethylcellulose (REFRESH TEARS) 0.5 % SOLN Place 1-2 drops into both eyes as needed (for dry eyes).    Marland Kitchen ibuprofen (ADVIL,MOTRIN) 200 MG tablet Take 200 mg by mouth every 6 (six) hours as needed for moderate pain.     No  current facility-administered medications for this visit.    Allergies:   Amoxicillin-pot clavulanate; Paroxetine hcl; and Sulfa antibiotics    Social History:  The patient  reports that she has been smoking Cigarettes.  She has a 45 pack-year smoking history. She has never used smokeless tobacco. She reports that she does not drink alcohol or use illicit drugs.   Family History:  The patient's family history includes Atrial fibrillation in her mother; Dementia in her father; Heart attack in her brother; Heart disease in her brother and mother; Hypertension in her mother; Stroke in her father.    ROS:  Please see the history of present illness.   Otherwise, review of systems are positive for none.   All other systems are reviewed and negative.    PHYSICAL EXAM: VS:  BP 144/82 mmHg  Pulse 96  Ht 5\' 5"  (1.651 m)  Wt 51.211 kg  (112 lb 14.4 oz)  BMI 18.79 kg/m2 , BMI Body mass index is 18.79 kg/(m^2). GENERAL:  Well appearing HEENT:  Pupils equal round and reactive, fundi not visualized, oral mucosa unremarkable NECK:  No jugular venous distention, waveform within normal limits, carotid upstroke brisk and symmetric, no bruits, no thyromegaly LYMPHATICS:  No cervical adenopathy LUNGS:  Clear to auscultation bilaterally HEART:  RRR.  PMI not displaced or sustained,S1 and S2 within normal limits, no S3, no S4, no clicks, no rubs, no murmurs ABD:  Flat, positive bowel sounds normal in frequency in pitch, no bruits, no rebound, no guarding, no midline pulsatile mass, Tenderness upon palpation of the RUQ, no plenomegaly EXT:  2 plus pulses throughout, no edema, no cyanosis no clubbing SKIN:  No rashes no nodules NEURO:  Cranial nerves II through XII grossly intact, motor grossly intact throughout PSYCH:  Cognitively intact, oriented to person place and time   EKG:  EKG is ordered today. The ekg ordered today demonstrates sinus rhythm at 96 bpm.  Septal t wave inversions.   Recent Labs: 12/11/2014: ALT 23; BUN 9.1; Creatinine 0.7; Potassium 4.3; Sodium 137 12/19/2014: Hemoglobin 14.7; Platelets 305    Lipid Panel No results found for: CHOL, TRIG, HDL, CHOLHDL, VLDL, LDLCALC, LDLDIRECT    Wt Readings from Last 3 Encounters:  01/09/15 51.211 kg (112 lb 14.4 oz)  12/30/14 52.164 kg (115 lb)  12/30/14 51.71 kg (114 lb)      ASSESSMENT AND PLAN:  # Pre-surgical risk assessment: Tina Bailey pre-operative risk of MI or cardiac arrest with in hepatic sugery is 1.19%, which is moderate.  She is unable to perform 4 METS of activity without shortness of breath.  Although this certainly may be due to her pulmonary disease, it could also be due to ischemia. She has risk factors for obstructive heart disease including ongoing tobacco use and age. She also reports occasional chest pain.  Therefore I have referred her for  exercise Myoview to assess for ischemia prior to her surgery.  Given that her surgery is planned for Tuesday, we will try to get her stress done on Monday.  # CV Disease Prevention: We will obtain lipids to assess her ASCVD 10 year risk and determine whether she should be on aspirin and statin.   Current medicines are reviewed at length with the patient today.  The patient does not have concerns regarding medicines.  The following changes have been made:  no change  Labs/ tests ordered today include:  No orders of the defined types were placed in this encounter.     Disposition:  FU with Bowman Higbie C. Oval Linsey, MD as needed.   Signed, Sharol Harness, MD  01/09/2015 11:12 AM    [place]

## 2015-01-09 ENCOUNTER — Ambulatory Visit (INDEPENDENT_AMBULATORY_CARE_PROVIDER_SITE_OTHER): Payer: Medicare Other | Admitting: Cardiovascular Disease

## 2015-01-09 ENCOUNTER — Encounter: Payer: Self-pay | Admitting: Cardiovascular Disease

## 2015-01-09 ENCOUNTER — Other Ambulatory Visit: Payer: Self-pay | Admitting: Radiology

## 2015-01-09 VITALS — BP 144/82 | HR 96 | Ht 65.0 in | Wt 112.9 lb

## 2015-01-09 DIAGNOSIS — R0602 Shortness of breath: Secondary | ICD-10-CM

## 2015-01-09 DIAGNOSIS — Z13220 Encounter for screening for lipoid disorders: Secondary | ICD-10-CM | POA: Diagnosis not present

## 2015-01-09 DIAGNOSIS — Z01810 Encounter for preprocedural cardiovascular examination: Secondary | ICD-10-CM | POA: Diagnosis not present

## 2015-01-09 NOTE — Patient Instructions (Signed)
Your physician has requested that you have an exercise stress myoview MONDAY. For further information please visit [email or web address]. Please follow instruction sheet, as given.  Your physician recommends that you return for lab work at your earliest [place].  Dr Oval Linsey recommends that you follow-up with her as needed.

## 2015-01-12 ENCOUNTER — Ambulatory Visit (HOSPITAL_COMMUNITY): Payer: Medicare Other | Attending: Internal Medicine

## 2015-01-12 ENCOUNTER — Other Ambulatory Visit: Payer: Self-pay | Admitting: Radiology

## 2015-01-12 DIAGNOSIS — Z01810 Encounter for preprocedural cardiovascular examination: Secondary | ICD-10-CM | POA: Insufficient documentation

## 2015-01-12 DIAGNOSIS — R0609 Other forms of dyspnea: Secondary | ICD-10-CM | POA: Insufficient documentation

## 2015-01-12 DIAGNOSIS — R0602 Shortness of breath: Secondary | ICD-10-CM | POA: Insufficient documentation

## 2015-01-12 DIAGNOSIS — F17200 Nicotine dependence, unspecified, uncomplicated: Secondary | ICD-10-CM | POA: Diagnosis not present

## 2015-01-12 DIAGNOSIS — R079 Chest pain, unspecified: Secondary | ICD-10-CM | POA: Diagnosis not present

## 2015-01-12 LAB — MYOCARDIAL PERFUSION IMAGING
Estimated workload: 5.8 METS
Exercise duration (min): 4 min
Exercise duration (sec): 0 s
LV dias vol: 64 mL
LV sys vol: 21 mL
MPHR: 151 {beats}/min
Peak HR: 150 {beats}/min
Percent HR: 99 %
RATE: 0.24
RPE: 18
Rest HR: 77 {beats}/min
SDS: 1
SRS: 0
SSS: 1
TID: 0.94

## 2015-01-12 MED ORDER — TECHNETIUM TC 99M SESTAMIBI GENERIC - CARDIOLITE
10.2000 | Freq: Once | INTRAVENOUS | Status: AC | PRN
  Administered 2015-01-12: 10 via INTRAVENOUS

## 2015-01-12 MED ORDER — TECHNETIUM TC 99M SESTAMIBI GENERIC - CARDIOLITE
29.6000 | Freq: Once | INTRAVENOUS | Status: AC | PRN
  Administered 2015-01-12: 30 via INTRAVENOUS

## 2015-01-13 ENCOUNTER — Encounter (HOSPITAL_COMMUNITY): Payer: Self-pay

## 2015-01-13 ENCOUNTER — Other Ambulatory Visit: Payer: Self-pay | Admitting: Diagnostic Radiology

## 2015-01-13 ENCOUNTER — Observation Stay (HOSPITAL_COMMUNITY)
Admission: RE | Admit: 2015-01-13 | Discharge: 2015-01-14 | Disposition: A | Discharge status: Home / Self Care | Payer: Medicare Other | Source: Ambulatory Visit | Attending: Interventional Radiology | Admitting: Interventional Radiology

## 2015-01-13 DIAGNOSIS — M797 Fibromyalgia: Secondary | ICD-10-CM | POA: Diagnosis not present

## 2015-01-13 DIAGNOSIS — J449 Chronic obstructive pulmonary disease, unspecified: Secondary | ICD-10-CM | POA: Diagnosis not present

## 2015-01-13 DIAGNOSIS — C220 Liver cell carcinoma: Principal | ICD-10-CM | POA: Insufficient documentation

## 2015-01-13 DIAGNOSIS — Z791 Long term (current) use of non-steroidal anti-inflammatories (NSAID): Secondary | ICD-10-CM | POA: Diagnosis not present

## 2015-01-13 DIAGNOSIS — F17210 Nicotine dependence, cigarettes, uncomplicated: Secondary | ICD-10-CM | POA: Insufficient documentation

## 2015-01-13 DIAGNOSIS — C229 Malignant neoplasm of liver, not specified as primary or secondary: Secondary | ICD-10-CM | POA: Diagnosis present

## 2015-01-13 DIAGNOSIS — Z7982 Long term (current) use of aspirin: Secondary | ICD-10-CM | POA: Insufficient documentation

## 2015-01-13 HISTORY — DX: Malignant (primary) neoplasm, unspecified: C80.1

## 2015-01-13 LAB — CBC WITH DIFFERENTIAL/PLATELET
Basophils Absolute: 0 10*3/uL (ref 0.0–0.1)
Basophils Relative: 1 %
Eosinophils Absolute: 0.1 10*3/uL (ref 0.0–0.7)
Eosinophils Relative: 2 %
HCT: 42 % (ref 36.0–46.0)
Hemoglobin: 13.9 g/dL (ref 12.0–15.0)
Lymphocytes Relative: 27 %
Lymphs Abs: 2.1 10*3/uL (ref 0.7–4.0)
MCH: 29.3 pg (ref 26.0–34.0)
MCHC: 33.1 g/dL (ref 30.0–36.0)
MCV: 88.6 fL (ref 78.0–100.0)
Monocytes Absolute: 0.6 10*3/uL (ref 0.1–1.0)
Monocytes Relative: 8 %
Neutro Abs: 4.8 10*3/uL (ref 1.7–7.7)
Neutrophils Relative %: 62 %
Platelets: 247 10*3/uL (ref 150–400)
RBC: 4.74 MIL/uL (ref 3.87–5.11)
RDW: 13 % (ref 11.5–15.5)
WBC: 7.6 10*3/uL (ref 4.0–10.5)

## 2015-01-13 LAB — PROTIME-INR
INR: 1.04 (ref 0.00–1.49)
Prothrombin Time: 13.8 seconds (ref 11.6–15.2)

## 2015-01-13 LAB — COMPREHENSIVE METABOLIC PANEL
ALT: 16 U/L (ref 14–54)
AST: 21 U/L (ref 15–41)
Albumin: 3.7 g/dL (ref 3.5–5.0)
Alkaline Phosphatase: 89 U/L (ref 38–126)
Anion gap: 6 (ref 5–15)
BUN: 9 mg/dL (ref 6–20)
CO2: 27 mmol/L (ref 22–32)
Calcium: 9 mg/dL (ref 8.9–10.3)
Chloride: 97 mmol/L — ABNORMAL LOW (ref 101–111)
Creatinine, Ser: 0.53 mg/dL (ref 0.44–1.00)
GFR calc Af Amer: >60 mL/min (ref >60)
GFR calc non Af Amer: >60 mL/min (ref >60)
Glucose, Bld: 108 mg/dL — ABNORMAL HIGH (ref 65–99)
Potassium: 4 mmol/L (ref 3.5–5.1)
Sodium: 130 mmol/L — ABNORMAL LOW (ref 135–145)
Total Bilirubin: 0.5 mg/dL (ref 0.3–1.2)
Total Protein: 6.2 g/dL — ABNORMAL LOW (ref 6.5–8.1)

## 2015-01-13 LAB — APTT: aPTT: 32 seconds (ref 24–37)

## 2015-01-13 MED ORDER — IOHEXOL 300 MG/ML  SOLN
100.0000 mL | Freq: Once | INTRAMUSCULAR | Status: DC | PRN
  Administered 2015-01-13: 50 mL via INTRAVENOUS
  Filled 2015-01-13: qty 100

## 2015-01-13 MED ORDER — MIDAZOLAM HCL 2 MG/2ML IJ SOLN
INTRAMUSCULAR | Status: AC
  Filled 2015-01-13: qty 2

## 2015-01-13 MED ORDER — HYDROMORPHONE HCL 1 MG/ML IJ SOLN
INTRAMUSCULAR | Status: AC
  Filled 2015-01-13: qty 1

## 2015-01-13 MED ORDER — PROMETHAZINE HCL 25 MG/ML IJ SOLN
INTRAMUSCULAR | Status: AC
  Administered 2015-01-13: 25 mg
  Filled 2015-01-13: qty 1

## 2015-01-13 MED ORDER — ASPIRIN-ACETAMINOPHEN-CAFFEINE 250-250-65 MG PO TABS
1.0000 | ORAL_TABLET | Freq: Four times a day (QID) | ORAL | Status: DC | PRN
  Filled 2015-01-13: qty 1

## 2015-01-13 MED ORDER — PROMETHAZINE HCL 25 MG/ML IJ SOLN
INTRAMUSCULAR | Status: AC | PRN
  Administered 2015-01-13: 12.5 mg via INTRAVENOUS

## 2015-01-13 MED ORDER — SODIUM CHLORIDE 0.9 % IJ SOLN
3.0000 mL | Freq: Two times a day (BID) | INTRAMUSCULAR | Status: DC
Start: 2015-01-13 — End: 2015-01-14
  Administered 2015-01-13 (×2): 3 mL via INTRAVENOUS

## 2015-01-13 MED ORDER — SODIUM CHLORIDE 0.9 % IV SOLN: 250.0000 mL | INTRAVENOUS | Status: DC | PRN

## 2015-01-13 MED ORDER — FENTANYL CITRATE (PF) 100 MCG/2ML IJ SOLN
INTRAMUSCULAR | Status: AC | PRN
  Administered 2015-01-13 (×4): 25 ug via INTRAVENOUS
  Administered 2015-01-13: 50 ug via INTRAVENOUS

## 2015-01-13 MED ORDER — ONDANSETRON HCL 4 MG/2ML IJ SOLN
INTRAMUSCULAR | Status: AC | PRN
  Administered 2015-01-13 (×2): 4 mg via INTRAVENOUS

## 2015-01-13 MED ORDER — SODIUM CHLORIDE 0.9 % IV SOLN: INTRAVENOUS | Status: AC

## 2015-01-13 MED ORDER — OXYCODONE HCL 5 MG PO TABS
10.0000 mg | ORAL_TABLET | ORAL | Status: DC | PRN
  Administered 2015-01-13 (×2): 10 mg via ORAL
  Filled 2015-01-13 (×2): qty 2

## 2015-01-13 MED ORDER — SODIUM CHLORIDE 0.9 % IV SOLN
Freq: Once | INTRAVENOUS | Status: AC
  Administered 2015-01-13: 11:00:00 via INTRAVENOUS

## 2015-01-13 MED ORDER — FENTANYL CITRATE (PF) 100 MCG/2ML IJ SOLN
INTRAMUSCULAR | Status: AC
  Filled 2015-01-13: qty 2

## 2015-01-13 MED ORDER — HYDROMORPHONE HCL 1 MG/ML IJ SOLN: 1.0000 mg | INTRAMUSCULAR | Status: DC | PRN

## 2015-01-13 MED ORDER — IBUPROFEN 200 MG PO TABS
200.0000 mg | ORAL_TABLET | Freq: Four times a day (QID) | ORAL | Status: DC | PRN
  Administered 2015-01-13: 200 mg via ORAL
  Filled 2015-01-13: qty 1

## 2015-01-13 MED ORDER — LIDOCAINE HCL 1 % IJ SOLN
INTRAMUSCULAR | Status: AC
  Filled 2015-01-13: qty 20

## 2015-01-13 MED ORDER — CARBOXYMETHYLCELLULOSE SODIUM 0.5 % OP SOLN: 1.0000 [drp] | OPHTHALMIC | Status: DC | PRN

## 2015-01-13 MED ORDER — PROMETHAZINE HCL 25 MG RE SUPP: 25.0000 mg | Freq: Three times a day (TID) | RECTAL | Status: DC | PRN

## 2015-01-13 MED ORDER — ONDANSETRON HCL 4 MG/2ML IJ SOLN
INTRAMUSCULAR | Status: AC
  Filled 2015-01-13: qty 2

## 2015-01-13 MED ORDER — HYDROMORPHONE HCL 1 MG/ML IJ SOLN
INTRAMUSCULAR | Status: AC | PRN
  Administered 2015-01-13: 1 mg via INTRAVENOUS

## 2015-01-13 MED ORDER — MIDAZOLAM HCL 2 MG/2ML IJ SOLN
INTRAMUSCULAR | Status: AC | PRN
  Administered 2015-01-13 (×2): 1 mg via INTRAVENOUS
  Administered 2015-01-13 (×2): 0.5 mg via INTRAVENOUS

## 2015-01-13 MED ORDER — ONDANSETRON HCL 4 MG/2ML IJ SOLN
4.0000 mg | Freq: Four times a day (QID) | INTRAMUSCULAR | Status: DC | PRN
  Administered 2015-01-14: 4 mg via INTRAVENOUS
  Filled 2015-01-13: qty 2

## 2015-01-13 MED ORDER — POLYVINYL ALCOHOL 1.4 % OP SOLN
1.0000 [drp] | OPHTHALMIC | Status: DC | PRN
  Filled 2015-01-13: qty 15

## 2015-01-13 MED ORDER — SODIUM CHLORIDE 0.9 % IJ SOLN: 3.0000 mL | INTRAMUSCULAR | Status: DC | PRN

## 2015-01-13 MED ORDER — CEFAZOLIN SODIUM-DEXTROSE 2-3 GM-% IV SOLR
INTRAVENOUS | Status: AC
  Administered 2015-01-13: 2000 mg
  Filled 2015-01-13: qty 50

## 2015-01-13 MED ORDER — SODIUM CHLORIDE 0.9 % IV SOLN
INTRAVENOUS | Status: AC | PRN
  Administered 2015-01-13: 10 mL/h via INTRAVENOUS

## 2015-01-13 MED ORDER — PROMETHAZINE HCL 25 MG PO TABS: 25.0000 mg | ORAL_TABLET | Freq: Three times a day (TID) | ORAL | Status: DC | PRN

## 2015-01-13 NOTE — Sedation Documentation (Signed)
Patient is resting comfortably. 

## 2015-01-13 NOTE — Sedation Documentation (Signed)
Pt awake and complaining of a lot of pain and nausea. Pt becoming very diaphoretic.

## 2015-01-13 NOTE — Sedation Documentation (Signed)
Pt moved to nurses station. Waiting for admit bed. Pt sleeping, dry heaving when awake. Denies any pain at this time.

## 2015-01-13 NOTE — Procedures (Signed)
Interventional Radiology Procedure Note  Procedure: Transhepatic portal vein embolization.   Complications: None  Estimated Blood Loss:  < 25 mL  Recommendations: - Admit for obs - Labs in am - Pain and nausea control  Signed,  Criselda Peaches, MD

## 2015-01-13 NOTE — Sedation Documentation (Signed)
Pt sleeping with no N/V since Phenergan given.

## 2015-01-13 NOTE — Sedation Documentation (Signed)
Transferred to 6N by stretcher. Daughter at bedside. Pt awake and alert. In no distress.

## 2015-01-13 NOTE — Progress Notes (Signed)
Patient ID: Tina Bailey, female   DOB: [birth year], 69 y.o.   MRN: [id number]    Referring Physician(s): Byerly  Chief Complaint:  Right hepatic lobe hepatocellular carcinoma  Subjective:  Patient doing fairly well; does have some occasional right upper quadrant/epigastric discomfort with movement; denies nausea or vomiting.  Allergies: Amoxicillin-pot clavulanate; Paroxetine hcl; and Sulfa antibiotics  Medications: Prior to Admission medications   Medication Sig Start Date End Date Taking? Authorizing Provider  aspirin-acetaminophen-caffeine (EXCEDRIN MIGRAINE) [phone] MG per tablet Take 1 tablet by mouth every 6 (six) hours as needed for headache.    Yes Historical Provider, MD  carboxymethylcellulose (REFRESH TEARS) 0.5 % SOLN Place 1-2 drops into both eyes as needed (for dry eyes).   Yes Historical Provider, MD  ibuprofen (ADVIL,MOTRIN) 200 MG tablet Take 200 mg by mouth every 6 (six) hours as needed for moderate pain.   Yes Historical Provider, MD     Vital Signs: BP 135/54 mmHg  Pulse 65  Temp(Src) 97.9 F (36.6 C)  Resp 11  Ht 5\' 5"  (1.651 m)  Wt 114 lb (51.71 kg)  BMI 18.97 kg/m2  SpO2 100%  Physical Exam patient awake, alert. Puncture site right upper quadrant abdomen clean, dry, mildly tender to palpation, no hematoma. Abdomen soft, nondistended  Imaging: Ir US Guidance  01/13/2015  CLINICAL DATA:  68 year old female with sporadic hepatocellular carcinoma. No evidence of cirrhosis. She is a candidate for extended right hepatectomy for definitive surgical resection but there is concern that her future liver remnant may be insufficient. Therefore, she presents for transhepatic embolization of the right portal venous system in an effort to induce left lobar hypertrophy prior to surgery. EXAM: IR EMBO TUMOR ORGAN ISCHEMIA INFARCT INC GUIDE ROADMAPPING; IR ULTRASOUND GUIDANCE; IR CHOLANGIOGRAM PERCUTANEOUS TRANSHEPATIC Date: 01/13/2015 PROCEDURE: 1.  Ultrasound-guided transhepatic puncture of the portal vein 2. Transhepatic portal venogram 3. Liquid embolization of the right portal venous system Interventional Radiologist:  Criselda Peaches, MD ANESTHESIA/SEDATION: Moderate (conscious) sedation was used. 3 mg Versed, 150 mcg Fentanyl and 1 mg Dilaudid were administered intravenously. The patient's vital signs were monitored continuously by radiology nursing throughout the procedure. Sedation Time: 105 minutes MEDICATIONS: 2 g Ancef administered intravenously within 1 hour of skin incision FLUOROSCOPY TIME:  60 minutes 6 seconds 1126.9 mGy CONTRAST:  48mL OMNIPAQUE IOHEXOL 300 MG/ML  SOLN TECHNIQUE: Informed consent was obtained from the patient following explanation of the procedure, risks, benefits and alternatives. The patient understands, agrees and consents for the procedure. All questions were addressed. A time out was performed. Maximal barrier sterile technique utilized including caps, mask, sterile gowns, sterile gloves, large sterile drape, hand hygiene, and Betadine skin prep. The right lateral abdominal wall was interrogated with ultrasound. The portal venous system was identified in traced. A suitable small inferior branch in hepatic segment 6 was identified. Local anesthesia was attained by infiltration with 1% lidocaine. Under real-time sonographic guidance, this vessel was punctured with a 21 gauge micropuncture needle. There was return of portal venous blood. A gentle hand injection of contrast material confirmed access within the portal venous system. A 0.018 inch micro wire was advanced into the portal venous system. The micropuncture needle was exchanged for a transitional micro sheath. The micro wire was then exchanged for a short Amplatz wire. A 5 French vascular sheath was advanced over the Amplatz wire and into the portal vein. The transhepatic portal venography was then performed in multiple obliquities to fully define the left and  right portal venous systems.  The left portal vein was well profiled. A sauce Omni selective reverse curve catheter was then advanced over a Bentson wire into the main portal vein. Additional portal venography was performed. The left portal vein was profile. Portal venous pressures were then measured. The portal venous pressure is 12 mm of mercury. The sauce Omni selective catheter was then used to select the segment 8 portal venous branch. Selective portal venography was performed confirming the anatomic position. True fill liquid embolic glue was prepared into aliquots, the first in a 1:9 ratio with lipiodol and the second in a 1:1.5 ratio. A Renegade high flow micro catheter was then advanced through the 5 [place] catheter. Liquid embolization was performed of the segment 8 portal venous branch using the dilute glue. The 5 French catheter was then navigated into the dominant segment 7/6 branch. The micro catheter was advanced distally. Portal venography was performed to confirm location. Additional glue embolization was performed until all branches were well filled. The 5 French catheter and micro catheter were then brought back as a unit into the accessed segment 5/6 branch. This was also embolized successfully. The catheters were removed. Finally, the transhepatic tract was embolized using the more concentrated glue. Overall, the patient tolerated the procedure well. She had some postprocedural pain which was managed with Dilaudid and postprocedural nausea and vomiting managed with Zofran and Phenergan. COMPLICATIONS: None Estimated blood loss: 0 IMPRESSION: Successful transhepatic portal venous embolization of the right portal venous system using True-Fill liquid embolic glue. Recommend anatomic imaging with CT scan (with and without contrast) in 4 weeks. If there is sufficient hypertrophy of the future liver remnant, surgery should proceed the same week or the following week if possible. Signed, Criselda Peaches, MD [facility] Radiology Electronically Signed   By: Jacqulynn Cadet M.D.   On: 01/13/2015 12:16   Ir Percutaneous Transhepatic Cholangiogram  01/13/2015  CLINICAL DATA:  69 year old female with sporadic hepatocellular carcinoma. No evidence of cirrhosis. She is a candidate for extended right hepatectomy for definitive surgical resection but there is concern that her future liver remnant may be insufficient. Therefore, she presents for transhepatic embolization of the right portal venous system in an effort to induce left lobar hypertrophy prior to surgery. EXAM: IR EMBO TUMOR ORGAN ISCHEMIA INFARCT INC GUIDE ROADMAPPING; IR ULTRASOUND GUIDANCE; IR CHOLANGIOGRAM PERCUTANEOUS TRANSHEPATIC Date: 01/13/2015 PROCEDURE: 1. Ultrasound-guided transhepatic puncture of the portal vein 2. Transhepatic portal venogram 3. Liquid embolization of the right portal venous system Interventional Radiologist:  Criselda Peaches, MD ANESTHESIA/SEDATION: Moderate (conscious) sedation was used. 3 mg Versed, 150 mcg Fentanyl and 1 mg Dilaudid were administered intravenously. The patient's vital signs were monitored continuously by radiology nursing throughout the procedure. Sedation Time: 105 minutes MEDICATIONS: 2 g Ancef administered intravenously within 1 hour of skin incision FLUOROSCOPY TIME:  60 minutes 6 seconds 1126.9 mGy CONTRAST:  6mL OMNIPAQUE IOHEXOL 300 MG/ML  SOLN TECHNIQUE: Informed consent was obtained from the patient following explanation of the procedure, risks, benefits and alternatives. The patient understands, agrees and consents for the procedure. All questions were addressed. A time out was performed. Maximal barrier sterile technique utilized including caps, mask, sterile gowns, sterile gloves, large sterile drape, hand hygiene, and Betadine skin prep. The right lateral abdominal wall was interrogated with ultrasound. The portal venous system  was identified in traced. A suitable small inferior branch in hepatic segment 6 was identified. Local anesthesia was attained by infiltration with 1% lidocaine. Under real-time sonographic guidance,  this vessel was punctured with a 21 gauge micropuncture needle. There was return of portal venous blood. A gentle hand injection of contrast material confirmed access within the portal venous system. A 0.018 inch micro wire was advanced into the portal venous system. The micropuncture needle was exchanged for a transitional micro sheath. The micro wire was then exchanged for a short Amplatz wire. A 5 French vascular sheath was advanced over the Amplatz wire and into the portal vein. The transhepatic portal venography was then performed in multiple obliquities to fully define the left and right portal venous systems. The left portal vein was well profiled. A sauce Omni selective reverse curve catheter was then advanced over a Bentson wire into the main portal vein. Additional portal venography was performed. The left portal vein was profile. Portal venous pressures were then measured. The portal venous pressure is 12 mm of mercury. The sauce Omni selective catheter was then used to select the segment 8 portal venous branch. Selective portal venography was performed confirming the anatomic position. True fill liquid embolic glue was prepared into aliquots, the first in a 1:9 ratio with lipiodol and the second in a 1:1.5 ratio. A Renegade high flow micro catheter was then advanced through the 5 [place] catheter. Liquid embolization was performed of the segment 8 portal venous branch using the dilute glue. The 5 French catheter was then navigated into the dominant segment 7/6 branch. The micro catheter was advanced distally. Portal venography was performed to confirm location. Additional glue embolization was performed until all branches were well filled. The 5 French catheter and micro catheter were then brought back as a  unit into the accessed segment 5/6 branch. This was also embolized successfully. The catheters were removed. Finally, the transhepatic tract was embolized using the more concentrated glue. Overall, the patient tolerated the procedure well. She had some postprocedural pain which was managed with Dilaudid and postprocedural nausea and vomiting managed with Zofran and Phenergan. COMPLICATIONS: None Estimated blood loss: 0 IMPRESSION: Successful transhepatic portal venous embolization of the right portal venous system using True-Fill liquid embolic glue. Recommend anatomic imaging with CT scan (with and without contrast) in 4 weeks. If there is sufficient hypertrophy of the future liver remnant, surgery should proceed the same week or the following week if possible. Signed, Criselda Peaches, MD [facility] Radiology Electronically Signed   By: Jacqulynn Cadet M.D.   On: 01/13/2015 12:16   Ir Embo Tumor Organ Ischemia Infarct Inc Guide Roadmapping  01/13/2015  CLINICAL DATA:  69 year old female with sporadic hepatocellular carcinoma. No evidence of cirrhosis. She is a candidate for extended right hepatectomy for definitive surgical resection but there is concern that her future liver remnant may be insufficient. Therefore, she presents for transhepatic embolization of the right portal venous system in an effort to induce left lobar hypertrophy prior to surgery. EXAM: IR EMBO TUMOR ORGAN ISCHEMIA INFARCT INC GUIDE ROADMAPPING; IR ULTRASOUND GUIDANCE; IR CHOLANGIOGRAM PERCUTANEOUS TRANSHEPATIC Date: 01/13/2015 PROCEDURE: 1. Ultrasound-guided transhepatic puncture of the portal vein 2. Transhepatic portal venogram 3. Liquid embolization of the right portal venous system Interventional Radiologist:  Criselda Peaches, MD ANESTHESIA/SEDATION: Moderate (conscious) sedation was used. 3 mg Versed, 150 mcg Fentanyl and 1 mg Dilaudid were administered intravenously. The  patient's vital signs were monitored continuously by radiology nursing throughout the procedure. Sedation Time: 105 minutes MEDICATIONS: 2 g Ancef administered intravenously within 1 hour of skin incision FLUOROSCOPY TIME:  60 minutes 6 seconds 1126.9 mGy CONTRAST:  22mL OMNIPAQUE IOHEXOL 300 MG/ML  SOLN TECHNIQUE: Informed consent was obtained from the patient following explanation of the procedure, risks, benefits and alternatives. The patient understands, agrees and consents for the procedure. All questions were addressed. A time out was performed. Maximal barrier sterile technique utilized including caps, mask, sterile gowns, sterile gloves, large sterile drape, hand hygiene, and Betadine skin prep. The right lateral abdominal wall was interrogated with ultrasound. The portal venous system was identified in traced. A suitable small inferior branch in hepatic segment 6 was identified. Local anesthesia was attained by infiltration with 1% lidocaine. Under real-time sonographic guidance, this vessel was punctured with a 21 gauge micropuncture needle. There was return of portal venous blood. A gentle hand injection of contrast material confirmed access within the portal venous system. A 0.018 inch micro wire was advanced into the portal venous system. The micropuncture needle was exchanged for a transitional micro sheath. The micro wire was then exchanged for a short Amplatz wire. A 5 French vascular sheath was advanced over the Amplatz wire and into the portal vein. The transhepatic portal venography was then performed in multiple obliquities to fully define the left and right portal venous systems. The left portal vein was well profiled. A sauce Omni selective reverse curve catheter was then advanced over a Bentson wire into the main portal vein. Additional portal venography was performed. The left portal vein was profile. Portal venous pressures were then measured. The portal venous pressure is 12 mm of mercury.  The sauce Omni selective catheter was then used to select the segment 8 portal venous branch. Selective portal venography was performed confirming the anatomic position. True fill liquid embolic glue was prepared into aliquots, the first in a 1:9 ratio with lipiodol and the second in a 1:1.5 ratio. A Renegade high flow micro catheter was then advanced through the 5 [place] catheter. Liquid embolization was performed of the segment 8 portal venous branch using the dilute glue. The 5 French catheter was then navigated into the dominant segment 7/6 branch. The micro catheter was advanced distally. Portal venography was performed to confirm location. Additional glue embolization was performed until all branches were well filled. The 5 French catheter and micro catheter were then brought back as a unit into the accessed segment 5/6 branch. This was also embolized successfully. The catheters were removed. Finally, the transhepatic tract was embolized using the more concentrated glue. Overall, the patient tolerated the procedure well. She had some postprocedural pain which was managed with Dilaudid and postprocedural nausea and vomiting managed with Zofran and Phenergan. COMPLICATIONS: None Estimated blood loss: 0 IMPRESSION: Successful transhepatic portal venous embolization of the right portal venous system using True-Fill liquid embolic glue. Recommend anatomic imaging with CT scan (with and without contrast) in 4 weeks. If there is sufficient hypertrophy of the future liver remnant, surgery should proceed the same week or the following week if possible. Signed, Criselda Peaches, MD [facility] Radiology Electronically Signed   By: Jacqulynn Cadet M.D.   On: 01/13/2015 12:16    Labs:  CBC:  Recent Labs  12/11/14 1234 12/19/14 1120 01/13/15 0757  WBC 9.1 9.4 7.6  HGB 14.8 14.7 13.9  HCT 44.5 43.2 42.0  PLT 310 305 247    COAGS:  Recent Labs   12/19/14 1120 01/13/15 0757  INR 0.99 1.04  APTT 33 32    BMP:  Recent Labs  12/11/14 1234 01/13/15 0757  NA 137 130*  K 4.3 4.0  CL  --  97*  CO2 30* 27  GLUCOSE 97 108*  BUN 9.1 9  CALCIUM 9.7 9.0  CREATININE 0.7 0.53  GFRNONAA  --  >60  GFRAA  --  >60    LIVER FUNCTION TESTS:  Recent Labs  12/11/14 1234 01/13/15 0757  BILITOT 0.49 0.5  AST 26 21  ALT 23 16  ALKPHOS 117 89  PROT 7.4 6.2*  ALBUMIN 4.3 3.7    Assessment and Plan: Status post transhepatic right portal vein embolization 10/25 in preparation for eventual hepatectomy secondary to [facility]; for overnight obs; ibuprofen or oxycodone IR prn abdominal pain; check a.m. labs; recommend follow-up CT scan of abdomen with and without contrast in 4 weeks.   Signed: D. Rowe Robert 01/13/2015, 4:45 PM   I spent a total of 15 minutes at the the patient's bedside AND on the patient's hospital floor or unit, greater than 50% of which was counseling/coordinating care for right portal vein embolization

## 2015-01-13 NOTE — Sedation Documentation (Signed)
Pt awake and becoming anxious. Denies pain. Versed given.

## 2015-01-13 NOTE — H&P (Signed)
Chief Complaint: Patient was seen in consultation today for portal vein embolization at the request of Dr Barry Dienes  Referring Physician(s): Dr Stark Klein  History of Present Illness: Tina Bailey is a 69 y.o. female   Large liver lesion was discovered while being worked up for PVD Bx proven hepatocellular carcinoma of lesion 12/19/2014 Pt has opted for surgical resection with Dr Barry Dienes at later date Consulted with Dr Anselm Pancoast 12/30/14 regarding portal vein embolization prior to surgery: Large solitary HCC involving segments 5 and 4B. Lesion measures up to 6.6 cm. According to Dr. Cher Nakai, she is planning on an extended right hepatectomy. I explained the rationale for a portal vein embolization prior to liver resection. We discussed how the therapy redirects portal blood to the segments of the future liver remnant and that results in hypertrophy. She understands that it would require percutaneous access to the liver which has a small risk of bleeding and infection. I discussed embolization of the right portal venous system and risk of nontarget embolization. I explained that she would require at least one night in the hospital for observation and treatment of post-embolic symptoms. I feel that the patient has a very good understanding of the procedure along with the risks and the benefits.  Now scheduled for this procedure today  Past Medical History  Diagnosis Date  . COPD (chronic obstructive pulmonary disease) ([place])   . Productive cough   . Wears glasses   . Arthritis     DDD  . Fibromyalgia   . Retina disorder     L eye, vision distorted, edema  . Macular degeneration     R eye  . 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 Osteoporosis   . Spine fracture due to birth trauma     Past Surgical History  Procedure Laterality Date  . Partial hysterectomy    . Kidney stone surgery    . Angioplasty  2008    no stents required, no follow-up with cardiologist, no recurrent chest pain    . Eye surgery      Allergies: Amoxicillin-pot clavulanate; Paroxetine hcl; and Sulfa antibiotics  Medications: Prior to Admission medications   Medication Sig Start Date End Date Taking? Authorizing Provider  aspirin-acetaminophen-caffeine (EXCEDRIN MIGRAINE) [phone] MG per tablet Take 1 tablet by mouth every 6 (six) hours as needed for headache.    Yes Historical Provider, MD  carboxymethylcellulose (REFRESH TEARS) 0.5 % SOLN Place 1-2 drops into both eyes as needed (for dry eyes).   Yes Historical Provider, MD  ibuprofen (ADVIL,MOTRIN) 200 MG tablet Take 200 mg by mouth every 6 (six) hours as needed for moderate pain.   Yes Historical Provider, MD     Family History  Problem Relation Age of Onset  . Hypertension Mother   . Atrial fibrillation Mother   . Heart disease Mother     after age 88  . Stroke Father   . Dementia Father   . Heart disease Brother     After age 5- A-Fib  . Heart attack Brother     Social History   Social History  . Marital Status: Divorced    Spouse Name: N/A  . Number of Children: N/A  . Years of Education: N/A   Social History Main Topics  . Smoking status: Current Every Day Smoker -- 1.00 packs/day for 45 years    Types: Cigarettes  . Smokeless tobacco: Never Used  . Alcohol Use: No  . Drug Use: No  .  Sexual Activity: Not Asked   Other Topics Concern  . None   Social History Narrative    Review of Systems: A 12 point ROS discussed and pertinent positives are indicated in the HPI above.  All other systems are negative.  Review of Systems  Constitutional: Positive for appetite change, fatigue and unexpected weight change. Negative for fever and activity change.  Respiratory: Positive for cough. Negative for shortness of breath.   Gastrointestinal: Positive for nausea and abdominal pain.  Musculoskeletal: Positive for back pain.  Neurological: Positive for weakness.  Psychiatric/Behavioral: Negative for behavioral problems and  confusion.    Vital Signs: BP 131/48 mmHg  Pulse 83  Temp(Src) 97.9 F (36.6 C)  Resp 20  Ht 5\' 5"  (1.651 m)  Wt 114 lb (51.71 kg)  BMI 18.97 kg/m2  SpO2 99%  Physical Exam  Constitutional: She is oriented to person, place, and time.  Cardiovascular: Normal rate, regular rhythm and normal heart sounds.   Pulmonary/Chest: Effort normal and breath sounds normal. She has no wheezes.  Abdominal: Soft. Bowel sounds are normal. There is tenderness.  Musculoskeletal: Normal range of motion.  Neurological: She is alert and oriented to person, place, and time.  Skin: Skin is warm and dry.  Psychiatric: She has a normal mood and affect. Her behavior is normal. Judgment and thought content normal.  Nursing note and vitals reviewed.   Mallampati Score:  MD Evaluation Airway: WNL Heart: WNL Abdomen: WNL Chest/ Lungs: WNL ASA  Classification: 3 Mallampati/Airway Score: One  Imaging: Ct Chest W Contrast  12/25/2014  CLINICAL DATA:  Newly diagnosed hepatocellular carcinoma presenting for chest staging. Cough, shortness of breath on exertion and 50 year smoking history. EXAM: CT CHEST WITH CONTRAST TECHNIQUE: Multidetector CT imaging of the chest was performed during intravenous contrast administration. CONTRAST:  83mL OMNIPAQUE IOHEXOL 300 MG/ML  SOLN COMPARISON:  12/02/2014 liver MRI.  12/01/2014 FINDINGS: Mediastinum/Nodes: Normal heart size. No pericardial fluid/thickening. Great vessels are normal in course and caliber. No central pulmonary emboli. Hypodense 1.8 cm posterior right thyroid lobe nodule (series 2/image 4). Normal esophagus. No axillary or hilar lymphadenopathy. There is a mildly enlarged 0.7 cm short axis right pericardiophrenic node (series 2/ image 44). Otherwise no mediastinal lymphadenopathy. Lungs/Pleura: No pneumothorax. No pleural effusion. There is layering slightly frothy material in the dependent lower tracheal lumen. Mild centrilobular emphysema. There is a 0.3 cm  ground-glass right upper lobe pulmonary nodule (series 5/image 13). No additional significant pulmonary nodules or lung masses. There is mild symmetric pleural-parenchymal scarring in the dependent apical upper lobes. No acute consolidative airspace disease. Upper abdomen: Re- demonstrated is a transient perfusional hyperenhancement in the anterior subcapsular segment 8 of the right liver lobe related to the known segments 4/5 hepatocellular carcinoma. Stable mild irregular thickening of the left adrenal gland without discrete left adrenal nodule. Musculoskeletal: No aggressive appearing focal osseous lesions. Moderate degenerative changes in the thoracic spine. Subacute nondisplaced healing lateral left fifth rib fracture. IMPRESSION: 1. Solitary 3 mm ground-glass pulmonary nodule in the right upper lobe. Recommend attention on follow-up chest CT in 3 months. 2. Mild right pericardiophrenic lymphadenopathy, possibly metastatic. 3. Mild centrilobular emphysema. 4. Nondisplaced healing subacute lateral left fifth rib fracture. 5. Right thyroid lobe 1.8 cm nodule. Recommend correlation with thyroid ultrasound. This follows ACR consensus guidelines: Managing Incidental Thyroid Nodules Detected on Imaging: White Paper of the ACR Incidental Thyroid Findings Committee. J Am Coll Radiol 2015; 12:143-150. Electronically Signed   By: Janina Mayo.D.  On: 12/25/2014 13:44   US Soft Tissue Head/neck  01/07/2015  CLINICAL DATA:  Right thyroid nodule noted on CT EXAM: THYROID ULTRASOUND TECHNIQUE: Ultrasound examination of the thyroid gland and adjacent soft tissues was performed. COMPARISON:  None. FINDINGS: Right thyroid lobe Measurements: 4.0 x 2.4 x 1.6 cm. 1.7 x 1.1 x 0.6 cm complex predominately solid nodule with calcifications and hypervascularity. 2.9 by 1.5 x 1.3 cm complex predominantly cystic nodule in the posterior lower pole. Left thyroid lobe Measurements: 4.0 x 1.1 x 0.8 cm.  4 mm mid lobe nodule. Isthmus  Thickness: 2 mm.  No nodules visualized. Lymphadenopathy None visualized. IMPRESSION: There are 2 nodules in the right side that meet criteria for fine needle aspiration biopsy as described. Findings meet consensus criteria for biopsy. Ultrasound-guided fine needle aspiration should be considered, as per the consensus statement: Management of Thyroid Nodules Detected at [place]: Society of Radiologists in [place]. Radiology 2005; [id number]. Electronically Signed   By: Marybelle Killings M.D.   On: 01/07/2015 11:07   US Biopsy  12/19/2014  INDICATION: 69 year old female with a history of liver mass. Differential diagnosis includes both cholangiocarcinoma and hepatocellular carcinoma. EXAM: ULTRASOUND GUIDED LIVER BIOPSY COMPARISON:  MR 12/02/2014, CT 12/01/2014 MEDICATIONS: Fentanyl 50 mcg IV; Versed 1.5 mg IV ANESTHESIA/SEDATION: Total Moderate Sedation time Twelve minutes COMPLICATIONS: None PROCEDURE: Informed written consent was obtained from the patient after a discussion of the risks, benefits and alternatives to treatment. The patient understands and consents the procedure. A timeout was performed prior to the initiation of the procedure. Ultrasound survey of the right upper quadrant was performed with images stored and sent to PACs. Once the patient is prepped and draped in the usual sterile fashion, ultrasound guidance was used to infiltrate the skin and subcutaneous tissues with 1% lidocaine without epinephrine. A small stab incision was made with 11 blade scalpel, and using ultrasound guidance, 17 gauge needle was advanced into mass of segment 4B/5. Three separate 18 gauge core biopsy were retrieved. Gel-Foam pledgets were infused with a small amount of contrast. Final image was stored. Patient tolerated the procedure well and remained hemodynamically stable throughout. No complications were encountered and no significant blood loss was encountered. FINDINGS: Ultrasound survey  demonstrates heterogeneous mass of the right liver lobe and extending into segment 4B. Images during the case demonstrate needle tip within the mass. Final image demonstrates no complicating features, with expected gas from Gel-Foam pledgets. IMPRESSION: Status post ultrasound-guided biopsy of liver mass, with tissue specimen sent to pathology for complete histopathologic analysis. Signed, Dulcy Fanny. Earleen Newport, DO [facility] Radiology Electronically Signed   By: Corrie Mckusick D.O.   On: 12/19/2014 14:04    Labs:  CBC:  Recent Labs  12/11/14 1234 12/19/14 1120 01/13/15 0757  WBC 9.1 9.4 7.6  HGB 14.8 14.7 13.9  HCT 44.5 43.2 42.0  PLT 310 305 247    COAGS:  Recent Labs  12/19/14 1120 01/13/15 0757  INR 0.99 1.04  APTT 33 32    BMP:  Recent Labs  12/11/14 1234 01/13/15 0757  NA 137 130*  K 4.3 4.0  CL  --  97*  CO2 30* 27  GLUCOSE 97 108*  BUN 9.1 9  CALCIUM 9.7 9.0  CREATININE 0.7 0.53  GFRNONAA  --  >60  GFRAA  --  >60    LIVER FUNCTION TESTS:  Recent Labs  12/11/14 1234 01/13/15 0757  BILITOT 0.49 0.5  AST 26 21  ALT  23 16  ALKPHOS 117 89  PROT 7.4 6.2*  ALBUMIN 4.3 3.7    TUMOR MARKERS:  Recent Labs  12/11/14 1234  AFPTM 15.7*  CEA 3.6  CA199 11.2    Assessment and Plan:  Large liver lesion Bx+ hepatocellular carcinoma Plan for surgical resection with Dr Barry Dienes Now scheduled today for pre surgical portal vein embolization Pt and family aware of procedure risks and benefits including but not limited to Infection, bleeding, organ damage; vessel damage; damage to surrounding structures; death Agreeable to proceed Consent signed and in chart Pt aware she will be admitted overnight; plan for discharge in am  Thank you for this interesting consult.  I greatly enjoyed meeting Tina Bailey and look forward to participating in their care.  A copy of this report was sent to the requesting  provider on this date.  Signed: Areya Lemmerman A 01/13/2015, 8:57 AM   I spent a total of  30 Minutes   in face to face in clinical consultation, greater than 50% of which was counseling/coordinating care for portal vein embolization

## 2015-01-13 NOTE — Progress Notes (Signed)
Main Portal pressure reading of 12

## 2015-01-13 NOTE — Sedation Documentation (Signed)
C/O pain. Fentanyl and Versed given

## 2015-01-13 NOTE — Sedation Documentation (Signed)
Dr. Laurence Ferrari notified of BP. NS bolus started.

## 2015-01-13 NOTE — Sedation Documentation (Signed)
Dilaudid 1mg  IV given per Dr. Anselm Pancoast. Pt complaining of a lot of pain to left upper abdomen. Pt remains very diaphoretic.

## 2015-01-14 ENCOUNTER — Telehealth: Payer: Self-pay | Admitting: *Deleted

## 2015-01-14 DIAGNOSIS — C220 Liver cell carcinoma: Secondary | ICD-10-CM | POA: Diagnosis not present

## 2015-01-14 LAB — COMPREHENSIVE METABOLIC PANEL
ALT: 20 U/L (ref 14–54)
AST: 23 U/L (ref 15–41)
Albumin: 3.5 g/dL (ref 3.5–5.0)
Alkaline Phosphatase: 79 U/L (ref 38–126)
Anion gap: 7 (ref 5–15)
BUN: 8 mg/dL (ref 6–20)
CO2: 28 mmol/L (ref 22–32)
Calcium: 8.8 mg/dL — ABNORMAL LOW (ref 8.9–10.3)
Chloride: 94 mmol/L — ABNORMAL LOW (ref 101–111)
Creatinine, Ser: 0.53 mg/dL (ref 0.44–1.00)
GFR calc Af Amer: >60 mL/min (ref >60)
GFR calc non Af Amer: >60 mL/min (ref >60)
Glucose, Bld: 99 mg/dL (ref 65–99)
Potassium: 3.7 mmol/L (ref 3.5–5.1)
Sodium: 129 mmol/L — ABNORMAL LOW (ref 135–145)
Total Bilirubin: 0.8 mg/dL (ref 0.3–1.2)
Total Protein: 5.9 g/dL — ABNORMAL LOW (ref 6.5–8.1)

## 2015-01-14 LAB — CBC
HCT: 39 % (ref 36.0–46.0)
Hemoglobin: 12.7 g/dL (ref 12.0–15.0)
MCH: 29.5 pg (ref 26.0–34.0)
MCHC: 32.6 g/dL (ref 30.0–36.0)
MCV: 90.5 fL (ref 78.0–100.0)
Platelets: 225 10*3/uL (ref 150–400)
RBC: 4.31 MIL/uL (ref 3.87–5.11)
RDW: 13 % (ref 11.5–15.5)
WBC: 10.1 10*3/uL (ref 4.0–10.5)

## 2015-01-14 MED ORDER — OXYCODONE HCL 10 MG PO TABS: 10.0000 mg | ORAL_TABLET | ORAL | Status: DC | PRN

## 2015-01-14 MED ORDER — ONDANSETRON HCL 4 MG PO TABS: 4.0000 mg | ORAL_TABLET | Freq: Three times a day (TID) | ORAL | Status: DC | PRN

## 2015-01-14 NOTE — Discharge Instructions (Signed)
Percutaneous Liver Procedure  Refer to this sheet in the next few weeks. These instructions provide you with information on caring for yourself after your procedure. Your health care provider may also give you more specific instructions. Your treatment has been planned according to current medical practices, but problems sometimes occur. Call your health care provider if you have any problems or questions after your procedure.  WHAT TO EXPECT AFTER THE PROCEDURE After your procedure, it is typical to have the following:  A small amount of discomfort in the area where the procedure was done and in the right shoulder or shoulder blade.  A small amount of bruising around the area where the procedure was done and on the skin over the liver.  Sleepiness and fatigue for the rest of the day.   HOME CARE INSTRUCTIONS   Rest at home for 1-2 days or as directed by your health care provider.  If necessary have a friend or family member stay with you for at least 24 hours.  Because of the medicines used during the procedure, you should not do the following things in the first 24 hours:  Drive.  Use machinery.  Be responsible for the care of other people.  Sign legal documents.  Take a bath or shower.  There are many different ways to close and cover an incision, including stitches, skin glue, and adhesive strips. Follow your health care provider's instructions on:  Incision care.  Bandage (dressing) changes and removal.  Incision closure removal.  Do not drink alcohol in the first week.  Do not lift more than 5 pounds or play contact sports for 2 weeks after this procedure.  Take medicines only as directed by your health care provider. Do not take medicine containing aspirin or non-steroidal anti-inflammatory medicines such as ibuprofen for 1 week after this test.  It is your responsibility to get your test results.   SEEK MEDICAL CARE IF:   You have increased bleeding from an  incision that results in more than a small spot of blood.  You have redness, swelling, or increasing pain in any incisions.  You notice a discharge or a bad smell coming from any of your incisions.  You have a fever or chills. SEEK IMMEDIATE MEDICAL CARE IF:   You develop swelling, bloating, or pain in your abdomen.  You become dizzy or faint.  You develop a rash.  You are nauseous or vomit.  You have difficulty breathing, feel short of breath, or feel faint.  You develop chest pain.  You have problems with your speech or vision.  You have trouble balancing or moving your arms or legs.   This information is not intended to replace advice given to you by your health care provider. Make sure you discuss any questions you have with your health care provider.   Document Released: 09/24/2004 Document Revised: 03/28/2014 Document Reviewed: 05/03/2013 Elsevier Interactive Patient Education [employer].

## 2015-01-14 NOTE — Progress Notes (Signed)
AVS given to patient and family, understanding demonstrated.  IV removed. Belongings packed and transportation arranged by daughter.

## 2015-01-14 NOTE — Telephone Encounter (Signed)
-----   Message from Skeet Latch, MD sent at 01/13/2015  5:08 PM EDT ----- Normal stress test.  She is low risk for surgery.

## 2015-01-14 NOTE — Discharge Summary (Signed)
Patient ID: KENLEY RETTINGER MRN: [id number] DOB/AGE: 1945/10/16 69 y.o.  Admit date: 01/13/2015 Discharge date: 01/14/2015  Admission Diagnoses: Right hepatic lobe hepatocellular carcinoma  Discharge Diagnoses:  Active Problems:   Hepatocellular carcinoma ([place])   Liver cancer [facility])   Discharged Condition: good  Hospital Course: HPI: 69 yo female with large right solitary HCC. She is for extended right hepatectomy and was scheduled for portal vein embolization prior to procedure. Summary of hospital course: She was brought to IR suite and underwent successful transhepatic portal venous access followed by embolization of the right portal venous system. She tolerated the procedure well with no immediate complications. She was admitted to the floor in stable condition. No overnight issues. She has some mild-moderate pain, controlled well with oral pain meds. She has eaten and has been OOB walking around. She otherwise feels well. Post-op labs look good as below. She is stable for discharge. All discharge instructions, medications, and follow up discussed.   Consults: None  Labs: Results for orders placed or performed during the hospital encounter of 01/13/15 (from the past 24 hour(s))  CBC     Status: None   Collection Time: 01/14/15  4:06 AM  Result Value Ref Range   WBC 10.1 4.0 - 10.5 K/uL   RBC 4.31 3.87 - 5.11 MIL/uL   Hemoglobin 12.7 12.0 - 15.0 g/dL   HCT 39.0 36.0 - 46.0 %   MCV 90.5 78.0 - 100.0 fL   MCH 29.5 26.0 - 34.0 pg   MCHC 32.6 30.0 - 36.0 g/dL   RDW 13.0 11.5 - 15.5 %   Platelets 225 150 - 400 K/uL  Comprehensive metabolic panel     Status: Abnormal   Collection Time: 01/14/15  4:06 AM  Result Value Ref Range   Sodium 129 (L) 135 - 145 mmol/L   Potassium 3.7 3.5 - 5.1 mmol/L   Chloride 94 (L) 101 - 111 mmol/L   CO2 28 22 - 32 mmol/L   Glucose, Bld 99 65 - 99 mg/dL   BUN 8 6 - 20 mg/dL   Creatinine, Ser 0.53 0.44 - 1.00 mg/dL   Calcium 8.8 (L)  8.9 - 10.3 mg/dL   Total Protein 5.9 (L) 6.5 - 8.1 g/dL   Albumin 3.5 3.5 - 5.0 g/dL   AST 23 15 - 41 U/L   ALT 20 14 - 54 U/L   Alkaline Phosphatase 79 38 - 126 U/L   Total Bilirubin 0.8 0.3 - 1.2 mg/dL   GFR calc non Af Amer >60 >60 mL/min   GFR calc Af Amer >60 >60 mL/min   Anion gap 7 5 - 15   Treatments: Status post transhepatic right portal vein embolization   Discharge Exam: Blood pressure 131/51, pulse 62, temperature 98.4 F (36.9 C), temperature source Oral, resp. rate 16, height 5\' 5"  (1.651 m), weight 114 lb (51.71 kg), SpO2 94 %. General: NAD Lungs: CTA without w/r/r Heart: Regular Abdomen: soft, ND. Mildly tender at procedure site. Site clean, no hematoma or erythema   Disposition: 01-Home or Self Care  Discharge Instructions    Call MD for:  difficulty breathing, headache or visual disturbances    Complete by:  As directed      Call MD for:  persistant nausea and vomiting    Complete by:  As directed      Call MD for:  redness, tenderness, or signs of infection (pain, swelling, redness, odor or green/yellow discharge around incision site)    Complete  by:  As directed      Call MD for:  severe uncontrolled pain    Complete by:  As directed      Call MD for:  temperature >100.4    Complete by:  As directed      Diet - low sodium heart healthy    Complete by:  As directed      Increase activity slowly    Complete by:  As directed      May shower / Bathe    Complete by:  As directed      May walk up steps    Complete by:  As directed      Remove dressing in 24 hours    Complete by:  As directed             Medication List    TAKE these medications        aspirin-acetaminophen-caffeine 250-250-65 MG tablet  Commonly known as:  EXCEDRIN MIGRAINE  Take 1 tablet by mouth every 6 (six) hours as needed for headache.     ibuprofen 200 MG tablet  Commonly known as:  ADVIL,MOTRIN  Take 200 mg by mouth every 6 (six) hours as needed for moderate pain.      ondansetron 4 MG tablet  Commonly known as:  ZOFRAN  Take 1 tablet (4 mg total) by mouth every 8 (eight) hours as needed for nausea or vomiting.     Oxycodone HCl 10 MG Tabs  Take 1 tablet (10 mg total) by mouth every 4 (four) hours as needed for moderate pain or breakthrough pain.     REFRESH TEARS 0.5 % Soln  Generic drug:  carboxymethylcellulose  Place 1-2 drops into both eyes as needed (for dry eyes).          SignedAscencion Dike 01/14/2015, 9:26 AM   I have spent Less Than 30 Minutes discharging Shirl Harris Masten.

## 2015-01-14 NOTE — Telephone Encounter (Signed)
LEFT MESSAGE ON ANSWER MACHINE. PATIENT HAS HAD PROCEDURES ON 01/13/15.

## 2015-01-30 ENCOUNTER — Telehealth: Payer: Self-pay

## 2015-01-30 NOTE — Telephone Encounter (Signed)
Returned call to pt re: episode.  Pt reports itching that started yesterday, constant, all over.  It has resolved as of now except for her palms.  Pt denies any new meds, foods, changes in products, detergents, soaps etc.  Pt reports she took showers - shuold she take benadryl.  Pt questioning whether she can use dandelion tea.  Advised pt she should always consult prior to taking any supplements to prevent interactions or side effects.  Pt reports she has been "talking to friends and reading on the internet".  Advised patient she can try benadryl and if not completely resolved by tomorrow call the clinic.  Pt also reports episode of pain last week that has now resolved.  Patient also reports episode of nausea last week that has now resolved.

## 2015-02-17 ENCOUNTER — Telehealth: Payer: Self-pay | Admitting: Hematology

## 2015-02-17 NOTE — Telephone Encounter (Signed)
pt cld to see if missed appt-gave pt next appt time & date of 04/01/15

## 2015-02-26 ENCOUNTER — Encounter (HOSPITAL_COMMUNITY)
Admission: RE | Admit: 2015-02-26 | Discharge: 2015-02-26 | Disposition: A | Discharge status: Home / Self Care | Payer: Medicare Other | Source: Ambulatory Visit | Attending: General Surgery | Admitting: General Surgery

## 2015-02-26 ENCOUNTER — Encounter (HOSPITAL_COMMUNITY): Payer: Self-pay

## 2015-02-26 ENCOUNTER — Encounter: Payer: Self-pay | Admitting: General Practice

## 2015-02-26 DIAGNOSIS — Z01818 Encounter for other preprocedural examination: Secondary | ICD-10-CM | POA: Insufficient documentation

## 2015-02-26 DIAGNOSIS — C220 Liver cell carcinoma: Secondary | ICD-10-CM | POA: Insufficient documentation

## 2015-02-26 HISTORY — DX: Other complications of anesthesia, initial encounter: T88.59XA

## 2015-02-26 HISTORY — DX: Nausea with vomiting, unspecified: Z98.890

## 2015-02-26 HISTORY — DX: Unspecified temporomandibular joint disorder, unspecified side: M26.609

## 2015-02-26 HISTORY — DX: Adverse effect of unspecified anesthetic, initial encounter: T41.45XA

## 2015-02-26 HISTORY — DX: Nausea with vomiting, unspecified: R11.2

## 2015-02-26 LAB — COMPREHENSIVE METABOLIC PANEL
ALT: 22 U/L (ref 14–54)
AST: 26 U/L (ref 15–41)
Albumin: 4.3 g/dL (ref 3.5–5.0)
Alkaline Phosphatase: 100 U/L (ref 38–126)
Anion gap: 9 (ref 5–15)
BUN: 10 mg/dL (ref 6–20)
CO2: 30 mmol/L (ref 22–32)
Calcium: 9.4 mg/dL (ref 8.9–10.3)
Chloride: 97 mmol/L — ABNORMAL LOW (ref 101–111)
Creatinine, Ser: 0.51 mg/dL (ref 0.44–1.00)
GFR calc Af Amer: >60 mL/min (ref >60)
GFR calc non Af Amer: >60 mL/min (ref >60)
Glucose, Bld: 94 mg/dL (ref 65–99)
Potassium: 4.8 mmol/L (ref 3.5–5.1)
Sodium: 136 mmol/L (ref 135–145)
Total Bilirubin: 0.5 mg/dL (ref 0.3–1.2)
Total Protein: 7.1 g/dL (ref 6.5–8.1)

## 2015-02-26 LAB — BLOOD GAS, ARTERIAL
Acid-Base Excess: 1.6 mmol/L (ref 0.0–2.0)
Bicarbonate: 25.3 mEq/L — ABNORMAL HIGH (ref 20.0–24.0)
Drawn by: 33147
FIO2: 0.21
O2 Saturation: 94.9 %
Patient temperature: 98.6
TCO2: 22.2 mmol/L (ref 0–100)
pCO2 arterial: 38.2 mmHg (ref 35.0–45.0)
pH, Arterial: 7.436 (ref 7.350–7.450)
pO2, Arterial: 73.1 mmHg — ABNORMAL LOW (ref 80.0–100.0)

## 2015-02-26 LAB — URINALYSIS, ROUTINE W REFLEX MICROSCOPIC
Bilirubin Urine: NEGATIVE
Glucose, UA: NEGATIVE mg/dL
Hgb urine dipstick: NEGATIVE
Ketones, ur: NEGATIVE mg/dL
Leukocytes, UA: NEGATIVE
Nitrite: NEGATIVE
Protein, ur: NEGATIVE mg/dL
Specific Gravity, Urine: 1.008 (ref 1.005–1.030)
pH: 7 (ref 5.0–8.0)

## 2015-02-26 LAB — CBC WITH DIFFERENTIAL/PLATELET
Basophils Absolute: 0 10*3/uL (ref 0.0–0.1)
Basophils Relative: 0 %
Eosinophils Absolute: 0.3 10*3/uL (ref 0.0–0.7)
Eosinophils Relative: 3 %
HCT: 43.6 % (ref 36.0–46.0)
Hemoglobin: 14.4 g/dL (ref 12.0–15.0)
Lymphocytes Relative: 33 %
Lymphs Abs: 2.9 10*3/uL (ref 0.7–4.0)
MCH: 29.8 pg (ref 26.0–34.0)
MCHC: 33 g/dL (ref 30.0–36.0)
MCV: 90.3 fL (ref 78.0–100.0)
Monocytes Absolute: 0.7 10*3/uL (ref 0.1–1.0)
Monocytes Relative: 8 %
Neutro Abs: 5.1 10*3/uL (ref 1.7–7.7)
Neutrophils Relative %: 56 %
Platelets: 297 10*3/uL (ref 150–400)
RBC: 4.83 MIL/uL (ref 3.87–5.11)
RDW: 13.6 % (ref 11.5–15.5)
WBC: 9 10*3/uL (ref 4.0–10.5)

## 2015-02-26 LAB — PROTIME-INR
INR: 1.02 (ref 0.00–1.49)
Prothrombin Time: 13.6 seconds (ref 11.6–15.2)

## 2015-02-26 LAB — ABO/RH: ABO/RH(D): A POS

## 2015-02-26 NOTE — Progress Notes (Signed)
02-26-15 1620 Call to [facility]) to make Dr. Barry Dienes aware pt seen by Dr, Christinia Gully and prefers pt has pulmonary clearance prior to proceeding with planned surgery. Pt has been cleared by cardiologist.

## 2015-02-26 NOTE — Progress Notes (Signed)
Spiritual Care Note  Met with Tina Bailey, daughter [name], and SIL [name] per [facility] appointment for Advance Directives assistance.  She appeared nervous and tearful during most of encounter, talking rapidly and haltingly, sometimes stumbling over her words.  She verbalized several sources of distress: grief over past losses, possible depression ("My friend thinks I should take a depression pill, and I think she might be right."), and facing the legal language of AD forms.  When we set the papers aside, her anxiety decreased and her communication became clearer.  She stated very calmly that she is not afraid of death or dying, but instead is nervous about the formal language on the paperwork.  [name] articulated clearly that she wishes for quality of life, which she defines as being actively engaged in what is important to her (errands, relationships, ADLs), and, in the event of physical/mental decline from which she wouldn't recover, she wishes to be kept comfortable without life-prolonging interventions.  She also stated that she desires her daughter [name] to have HCPOA and that her HCPOA take precedence over her stipulations in her living will.  Given pt's anxiety level and limited time before next appointment, she and family plan to make f/u appointment with LCSW to notarize documents.  They are aware of process and have copies of AD forms, as well as chaplain's and LCSWs' cards.  Dtr [name] also plans to reach out to chaplain for further emotional support and has counseling interns' pamphlets, as well.  Laura, [place], [facility] Pager [phone] Voicemail  [phone]

## 2015-02-26 NOTE — Pre-Procedure Instructions (Signed)
01-11-14 Stress, EKG 01-09-15, CT Chest 12-25-14 Epic and reports place with chart per request Dr. Tresa Moore. ABG's done toda y per order.

## 2015-02-26 NOTE — Consult Note (Signed)
Asked to see patient prior to planned partial hepatic resection to be done on 03/04/2015 at [facility].  She has a 86 year smoking history and continues to smoke.  She does not have a pulmonologist and is on no pulmonary medications.  Her exercise capacity is reduced and she seems to get short of breath easily. Significant Data include:  Stress Myoview done 12/2014 which showed EF of 65% and no ischemia. She had prior PTCA in 2008.   ABG on Room Air today showed Ph 7.4, CO2 38, O2 73, and HCO3 25.  Auscultation of her chest did reveal some wheezing.  Patient also said she has been coughing more the last few days.  A Pulmonary consult would be helpful to document her PFTs and to see if she might benefit from some dilators.  It also would be nice to know that her pulmonary statis is as best as it can be and that she is not coming down with an acute exacerbation.

## 2015-02-26 NOTE — Patient Instructions (Addendum)
Tina Bailey  02/26/2015   Your procedure is scheduled on:   03-04-2015 Wednesday  Enter through [place] and follow signs to [facility] to [place]. Arrive at   0700     AM.  (Limit 1 person with you).  Call this number if you have problems the morning of surgery: [phone]  Or Presurgical Testing [phone] days before.   For Living Will and/or Health Care Power Attorney Forms: please provide copy for your medical record,may bring AM of surgery(Forms should be already notarized -we do not provide this service).( No information provided will bring AM of or provide.).  Remember: Follow any bowel prep instructions per MD office.( 1 Fleet  Enema night before surgery-pick up from drugstore.)    Do not eat food/ or drink: After Midnight.     Take these medicines the morning of surgery with A SIP OF WATER-   (DO NOT TAKE ANY DIABETIC MEDS AM OF SURGERY) : Oxycodone(-if needed).   Do not wear jewelry, make-up or nail polish.  Do not wear deodorant, lotions, powders, or perfumes.   Do not shave legs and under arms- 48 hours(2 days) prior to first CHG shower.(Shaving face and neck okay.)  Do not bring valuables to the hospital.(Hospital is not responsible for lost valuables).  Contacts, dentures or removable bridgework, body piercing, hair pins may not be worn into surgery.  Leave suitcase in the car. After surgery it may be brought to your room.  For patients admitted to the hospital, checkout time is 11:00 AM the day of discharge.(Restricted visitors-Any Persons displaying flu-like symptoms or illness).    Patients discharged the day of surgery will not be allowed to drive home. Must have responsible person with you x 24 hours once discharged.  Name and phone number of your driver: [name] ,daughter [phone] cell     Please read over the following fact sheets that you were given:  CHG(Chlorhexidine Gluconate 4% Surgical Soap)  use.  Remember : Type/Screen "Blue armbands" - may not be removed once applied(would result in being retested AM of surgery, if removed).         [facility] - Preparing for Surgery Before surgery, you can play an important role.  Because skin is not sterile, your skin needs to be as free of germs as possible.  You can reduce the number of germs on your skin by washing with CHG (chlorahexidine gluconate) soap before surgery.  CHG is an antiseptic cleaner which kills germs and bonds with the skin to continue killing germs even after washing. Please DO NOT use if you have an allergy to CHG or antibacterial soaps.  If your skin becomes reddened/irritated stop using the CHG and inform your nurse when you arrive at Short Stay. Do not shave (including legs and underarms) for at least 48 hours prior to the first CHG shower.  You may shave your face/neck. Please follow these instructions carefully:  1.  Shower with CHG Soap the night before surgery and the  morning of Surgery.  2.  If you choose to wash your hair, wash your hair first as usual with your  normal  shampoo.  3.  After you shampoo, rinse your hair and body thoroughly to remove the  shampoo.                           4.  Use CHG as you would  any other liquid soap.  You can apply chg directly  to the skin and wash                       Gently with a scrungie or clean washcloth.  5.  Apply the CHG Soap to your body ONLY FROM THE NECK DOWN.   Do not use on face/ open                           Wound or open sores. Avoid contact with eyes, ears mouth and genitals (private parts).                       Wash face,  Genitals (private parts) with your normal soap.             6.  Wash thoroughly, paying special attention to the area where your surgery  will be performed.  7.  Thoroughly rinse your body with warm water from the neck down.  8.  DO NOT shower/wash with your normal soap after using and rinsing off  the CHG Soap.                9.  Pat  yourself dry with a clean towel.            10.  Wear clean pajamas.            11.  Place clean sheets on your bed the night of your first shower and do not  sleep with pets. Day of Surgery : Do not apply any lotions/deodorants the morning of surgery.  Please wear clean clothes to the hospital/surgery center.  FAILURE TO FOLLOW THESE INSTRUCTIONS MAY RESULT IN THE CANCELLATION OF YOUR SURGERY PATIENT SIGNATURE_________________________________  NURSE SIGNATURE__________________________________  ________________________________________________________________________

## 2015-02-27 LAB — PREPARE RBC (CROSSMATCH)

## 2015-02-27 NOTE — Progress Notes (Signed)
02-27-15 1000 "Tina Bailey" of Dr. Marlowe Aschoff office call to say pt is being scheduled to see [facility]- hopefully prior to 03-04-15 or would be rescheduled.

## 2015-03-03 ENCOUNTER — Encounter: Payer: Self-pay | Admitting: Internal Medicine

## 2015-03-03 ENCOUNTER — Telehealth: Payer: Self-pay | Admitting: Internal Medicine

## 2015-03-03 ENCOUNTER — Ambulatory Visit (INDEPENDENT_AMBULATORY_CARE_PROVIDER_SITE_OTHER): Payer: Medicare Other | Admitting: Internal Medicine

## 2015-03-03 ENCOUNTER — Encounter: Payer: Self-pay | Admitting: *Deleted

## 2015-03-03 VITALS — BP 108/72 | HR 84 | Ht 66.0 in | Wt 109.4 lb

## 2015-03-03 DIAGNOSIS — J449 Chronic obstructive pulmonary disease, unspecified: Secondary | ICD-10-CM | POA: Diagnosis not present

## 2015-03-03 DIAGNOSIS — R059 Cough, unspecified: Secondary | ICD-10-CM

## 2015-03-03 DIAGNOSIS — Z720 Tobacco use: Secondary | ICD-10-CM | POA: Diagnosis not present

## 2015-03-03 DIAGNOSIS — R05 Cough: Secondary | ICD-10-CM

## 2015-03-03 DIAGNOSIS — F17210 Nicotine dependence, cigarettes, uncomplicated: Secondary | ICD-10-CM | POA: Diagnosis not present

## 2015-03-03 NOTE — Patient Instructions (Signed)
You have moderately severe copd and need to quit smoking now but it is not prohibitive to surgery on your liver  You are taking a risk by smoking up until the day of surgery > ideally you need to stop 2 weeks prior if at all possible to improve cough mechanics/ reduce sputum production   We can use duoneb (breathing treatments) perioperatively if needed

## 2015-03-03 NOTE — Assessment & Plan Note (Addendum)
Spirometry 03/03/2015   FEV1  1.04 (44%) ratio 58  - 03/03/2015  Walked RA x 3 laps @ 185 ft each stopped due to  End of study, brisk pace, no sob or desat    There is no cutoff for non-thoracic surgery in copd pts - she is well compensated s meds but still actively smoking with mild/mod  CB which may be an issue post op with pulmonary cough mechanics in pt with liver surgery but is not prohibitive per se as not flaring   Ideally should not smoke x 2 weeks but flatly told me putting surgery off x 2 weeks would not help as she would not likely be able to stop during this "washout" period and accepted a higher risk for surgery so with this stipulation cleared for surgery with prn duonebs and pulmonary f/u prn   Total time devoted to counseling  = 3560 m review case/pfts with pt/ discussion of options/alternatives/ giving and going over instructions (see avs)    /

## 2015-03-03 NOTE — Telephone Encounter (Signed)
done

## 2015-03-03 NOTE — Progress Notes (Signed)
Subjective:    Patient ID: Tina Bailey, female    DOB: [birth year],    MRN: [id number]  HPI  39 yowf active smoker with mostly c/o chronic cough x years attributed to pnds with dx of copd in 2008 but not on resp rx and referred to pulmonary clinic 03/03/2015 by Dr Barry Dienes with GOLD III criteria on spirometry    03/03/2015 1st [facility] office visit/ [place]   Chief Complaint  Patient presents with  . PULMONARY CONSULT    Referred by Dr. Barry Dienes. Pt needs surgical clearance for liver resection tomorrow. Pt was told she has "mild" emphysema. Pt c/o occasional wheeze, cough with creamy thick mucus and some sinus/nasal congestion and drainage. Pt does smoke. Pt does not use inhalers.   cough x years esp in am's but Not limited by breathing from desired activities  Able to shop ok- no hcp Total amt of mucus typically < 2 tsp / white/ not usually bloody or purulent nor is it now  No obvious other patterns in day to day or daytime variabilty or assoc chronic cough or cp or chest tightness, subjective wheeze overt sinus or hb symptoms. No unusual exp hx or h/o childhood pna/ asthma or knowledge of premature birth.  Sleeping ok without nocturnal  or early am exacerbation  of respiratory  c/o's or need for noct saba. Also denies any obvious fluctuation of symptoms with weather or environmental changes or other aggravating or alleviating factors except as outlined above   Current Medications, Allergies, Complete Past Medical History, Past Surgical History, Family History, and Social History were reviewed in [employer] record.           Review of Systems  Constitutional: Negative.  Negative for fever and unexpected weight change.  HENT: Positive for congestion, postnasal drip and sinus pressure. Negative for dental problem, ear pain, nosebleeds, rhinorrhea, sneezing, sore throat and trouble swallowing.   Eyes: Negative.  Negative for redness and itching.    Respiratory: Positive for cough and wheezing. Negative for chest tightness and shortness of breath.   Cardiovascular: Negative.  Negative for palpitations and leg swelling.  Gastrointestinal: Negative.  Negative for nausea and vomiting.  Endocrine: Negative.   Genitourinary: Negative.  Negative for dysuria.  Musculoskeletal: Positive for arthralgias. Negative for joint swelling.  Skin: Negative.  Negative for rash.  Allergic/Immunologic: Negative.   Neurological: Negative.  Negative for headaches.  Hematological: Negative.  Does not bruise/bleed easily.  Psychiatric/Behavioral: Negative.  Negative for dysphoric mood. The patient is not nervous/anxious.        Objective:   Physical Exam  amb pleasant talkative wf nad  Wt Readings from Last 3 Encounters:  03/03/15 109 lb 6.4 oz (49.624 kg)  02/26/15 110 lb (49.896 kg)  01/13/15 114 lb (51.71 kg)    Vital signs reviewed  HEENT: nl dentition, turbinates, and oropharynx. Nl external ear canals without cough reflex   NECK :  without JVD/Nodes/TM/ nl carotid upstrokes bilaterally   LUNGS: no acc muscle use,  slt barrel chest which is clear to A and P bilaterally without cough on insp or exp maneuvers   CV:  RRR  no s3 or murmur or increase in P2, no edema   ABD:  soft and nontender with nl inspiratory excursion in the supine position. No bruits or organomegaly, bowel sounds nl  MS:  Nl gait/ ext warm without deformities, calf tenderness, cyanosis or clubbing No obvious joint restrictions   SKIN: warm and dry  without lesions    NEURO:  alert, approp, nl sensorium with  no motor deficits      I personally reviewed images and agree with radiology impression as follows:  CT Chest   12/25/14  1. Solitary 3 mm ground-glass pulmonary nodule in the right upper lobe. Recommend attention on follow-up chest CT in 3 months. 2. Mild right pericardiophrenic lymphadenopathy, possibly metastatic. 3. Mild centrilobular emphysema. 4.  Nondisplaced healing subacute lateral left fifth rib fracture.          Assessment & Plan:

## 2015-03-03 NOTE — Telephone Encounter (Signed)
[facility] is needing the assessment from the pt's visit today.  MW - please compete your note ASAP as pt is having surgery tomorrow.

## 2015-03-03 NOTE — Assessment & Plan Note (Signed)
C/w acquired mc dysfunction from smoking> no rx needed except to quit smoking / mucinex otc prn

## 2015-03-03 NOTE — Progress Notes (Signed)
[facility]  Clinical Social Work was referred by patient to review and complete healthcare advance directives.  Clinical Social Worker met with patient in [place] office.  The patient designated Zella Richer Dillard as their primary healthcare agent.  Patient also completed healthcare living will.    Clinical Social Worker notarized documents and made copies for patient/family. Clinical Social Worker will send documents to medical records to be scanned into patient's chart. Clinical Social Worker encouraged patient/family to contact with any additional questions or concerns.  Johnnye Lana, MSW, LCSW, OSW-C Clinical Social Worker [facility] [phone]

## 2015-03-03 NOTE — Telephone Encounter (Signed)
Attempted to contact [facility] but office is currently closed.  Will call back.

## 2015-03-03 NOTE — Assessment & Plan Note (Signed)

## 2015-03-04 ENCOUNTER — Encounter (HOSPITAL_COMMUNITY)
Admission: RE | Disposition: A | Discharge status: Home / Self Care | Payer: Self-pay | Source: Ambulatory Visit | Attending: General Surgery

## 2015-03-04 ENCOUNTER — Encounter (HOSPITAL_COMMUNITY): Payer: Self-pay | Admitting: *Deleted

## 2015-03-04 ENCOUNTER — Inpatient Hospital Stay (HOSPITAL_COMMUNITY): Payer: Medicare Other | Admitting: Registered Nurse

## 2015-03-04 ENCOUNTER — Inpatient Hospital Stay (HOSPITAL_COMMUNITY)
Admission: RE | Admit: 2015-03-04 | Discharge: 2015-03-13 | DRG: 406 | Disposition: A | Discharge status: Home / Self Care | Payer: Medicare Other | Source: Ambulatory Visit | Attending: General Surgery | Admitting: General Surgery

## 2015-03-04 DIAGNOSIS — Z888 Allergy status to other drugs, medicaments and biological substances status: Secondary | ICD-10-CM | POA: Diagnosis not present

## 2015-03-04 DIAGNOSIS — C220 Liver cell carcinoma: Principal | ICD-10-CM | POA: Diagnosis present

## 2015-03-04 DIAGNOSIS — Z882 Allergy status to sulfonamides status: Secondary | ICD-10-CM | POA: Diagnosis not present

## 2015-03-04 DIAGNOSIS — E861 Hypovolemia: Secondary | ICD-10-CM | POA: Diagnosis not present

## 2015-03-04 DIAGNOSIS — K8020 Calculus of gallbladder without cholecystitis without obstruction: Secondary | ICD-10-CM | POA: Diagnosis present

## 2015-03-04 DIAGNOSIS — M810 Age-related osteoporosis without current pathological fracture: Secondary | ICD-10-CM | POA: Diagnosis present

## 2015-03-04 DIAGNOSIS — M1990 Unspecified osteoarthritis, unspecified site: Secondary | ICD-10-CM | POA: Diagnosis present

## 2015-03-04 DIAGNOSIS — J449 Chronic obstructive pulmonary disease, unspecified: Secondary | ICD-10-CM | POA: Diagnosis present

## 2015-03-04 DIAGNOSIS — Z7982 Long term (current) use of aspirin: Secondary | ICD-10-CM

## 2015-03-04 DIAGNOSIS — D134 Benign neoplasm of liver: Secondary | ICD-10-CM | POA: Diagnosis present

## 2015-03-04 DIAGNOSIS — M797 Fibromyalgia: Secondary | ICD-10-CM | POA: Diagnosis present

## 2015-03-04 DIAGNOSIS — E8809 Other disorders of plasma-protein metabolism, not elsewhere classified: Secondary | ICD-10-CM | POA: Diagnosis present

## 2015-03-04 DIAGNOSIS — Z681 Body mass index (BMI) 19 or less, adult: Secondary | ICD-10-CM | POA: Diagnosis not present

## 2015-03-04 DIAGNOSIS — R109 Unspecified abdominal pain: Secondary | ICD-10-CM | POA: Diagnosis present

## 2015-03-04 DIAGNOSIS — I739 Peripheral vascular disease, unspecified: Secondary | ICD-10-CM | POA: Diagnosis present

## 2015-03-04 DIAGNOSIS — Z01812 Encounter for preprocedural laboratory examination: Secondary | ICD-10-CM | POA: Diagnosis not present

## 2015-03-04 DIAGNOSIS — E440 Moderate protein-calorie malnutrition: Secondary | ICD-10-CM | POA: Diagnosis present

## 2015-03-04 DIAGNOSIS — F17210 Nicotine dependence, cigarettes, uncomplicated: Secondary | ICD-10-CM | POA: Diagnosis present

## 2015-03-04 DIAGNOSIS — I959 Hypotension, unspecified: Secondary | ICD-10-CM | POA: Diagnosis not present

## 2015-03-04 DIAGNOSIS — Z8249 Family history of ischemic heart disease and other diseases of the circulatory system: Secondary | ICD-10-CM

## 2015-03-04 DIAGNOSIS — R0602 Shortness of breath: Secondary | ICD-10-CM

## 2015-03-04 DIAGNOSIS — H3530 Unspecified macular degeneration: Secondary | ICD-10-CM | POA: Diagnosis present

## 2015-03-04 DIAGNOSIS — Z87442 Personal history of urinary calculi: Secondary | ICD-10-CM

## 2015-03-04 DIAGNOSIS — E871 Hypo-osmolality and hyponatremia: Secondary | ICD-10-CM | POA: Diagnosis not present

## 2015-03-04 DIAGNOSIS — D62 Acute posthemorrhagic anemia: Secondary | ICD-10-CM | POA: Diagnosis not present

## 2015-03-04 HISTORY — PX: LAPAROSCOPIC PARTIAL HEPATECTOMY: SHX5909

## 2015-03-04 LAB — MRSA PCR SCREENING: MRSA by PCR: NEGATIVE

## 2015-03-04 SURGERY — HEPATECTOMY, PARTIAL, LAPAROSCOPIC
Anesthesia: General | Site: Abdomen

## 2015-03-04 MED ORDER — CIPROFLOXACIN IN D5W 400 MG/200ML IV SOLN
400.0000 mg | Freq: Two times a day (BID) | INTRAVENOUS | Status: AC
  Administered 2015-03-04: 400 mg via INTRAVENOUS
  Filled 2015-03-04: qty 200

## 2015-03-04 MED ORDER — PROMETHAZINE HCL 25 MG/ML IJ SOLN: 6.2500 mg | INTRAMUSCULAR | Status: DC | PRN

## 2015-03-04 MED ORDER — BUPIVACAINE HCL (PF) 0.25 % IJ SOLN: INTRAMUSCULAR | Status: DC | PRN

## 2015-03-04 MED ORDER — ONDANSETRON HCL 4 MG/2ML IJ SOLN
INTRAMUSCULAR | Status: DC | PRN
  Administered 2015-03-04: 4 mg via INTRAVENOUS

## 2015-03-04 MED ORDER — BUPIVACAINE-EPINEPHRINE (PF) 0.25% -1:200000 IJ SOLN
INTRAMUSCULAR | Status: DC | PRN
  Administered 2015-03-04: 5 mL

## 2015-03-04 MED ORDER — FENTANYL CITRATE (PF) 100 MCG/2ML IJ SOLN
INTRAMUSCULAR | Status: DC | PRN
  Administered 2015-03-04: 50 ug via INTRAVENOUS
  Administered 2015-03-04 (×4): 25 ug via INTRAVENOUS
  Administered 2015-03-04: 100 ug via INTRAVENOUS

## 2015-03-04 MED ORDER — LACTATED RINGERS IR SOLN
Status: DC | PRN
  Administered 2015-03-04: 1000 mL

## 2015-03-04 MED ORDER — NICOTINE 14 MG/24HR TD PT24
14.0000 mg | MEDICATED_PATCH | Freq: Every day | TRANSDERMAL | Status: DC
  Administered 2015-03-04 – 2015-03-12 (×9): 14 mg via TRANSDERMAL
  Filled 2015-03-04 (×10): qty 1

## 2015-03-04 MED ORDER — EPHEDRINE SULFATE 50 MG/ML IJ SOLN
INTRAMUSCULAR | Status: AC
  Filled 2015-03-04: qty 1

## 2015-03-04 MED ORDER — SUGAMMADEX SODIUM 200 MG/2ML IV SOLN
INTRAVENOUS | Status: DC | PRN
  Administered 2015-03-04: 200 mg via INTRAVENOUS

## 2015-03-04 MED ORDER — FENTANYL CITRATE (PF) 250 MCG/5ML IJ SOLN
INTRAMUSCULAR | Status: AC
  Filled 2015-03-04: qty 5

## 2015-03-04 MED ORDER — LIDOCAINE HCL 1 % IJ SOLN
INTRAMUSCULAR | Status: AC
  Filled 2015-03-04: qty 20

## 2015-03-04 MED ORDER — METHOCARBAMOL 500 MG PO TABS: 500.0000 mg | ORAL_TABLET | Freq: Four times a day (QID) | ORAL | Status: DC | PRN

## 2015-03-04 MED ORDER — LACTATED RINGERS IV SOLN
INTRAVENOUS | Status: DC | PRN
  Administered 2015-03-04: 08:00:00 via INTRAVENOUS

## 2015-03-04 MED ORDER — EVICEL 5 ML EX KIT
PACK | Freq: Once | CUTANEOUS | Status: AC
  Administered 2015-03-04: 1
  Filled 2015-03-04: qty 1

## 2015-03-04 MED ORDER — ONDANSETRON HCL 4 MG/2ML IJ SOLN: 4.0000 mg | Freq: Four times a day (QID) | INTRAMUSCULAR | Status: DC | PRN

## 2015-03-04 MED ORDER — FENTANYL 40 MCG/ML IV SOLN
INTRAVENOUS | Status: DC
  Administered 2015-03-05: 290 ug via INTRAVENOUS
  Administered 2015-03-05: 13:00:00 via INTRAVENOUS
  Administered 2015-03-06: 110 ug via INTRAVENOUS
  Administered 2015-03-06: 20 ug via INTRAVENOUS
  Administered 2015-03-06: 10 ug via INTRAVENOUS
  Administered 2015-03-06: 130 ug via INTRAVENOUS
  Administered 2015-03-06: 30 ug via INTRAVENOUS
  Administered 2015-03-06: 0 ug via INTRAVENOUS
  Administered 2015-03-07: 90 ug via INTRAVENOUS
  Administered 2015-03-07: 60 ug via INTRAVENOUS
  Administered 2015-03-07: 20 ug via INTRAVENOUS
  Administered 2015-03-07: 60 ug via INTRAVENOUS
  Administered 2015-03-07: 09:00:00 via INTRAVENOUS
  Administered 2015-03-07: 30 ug via INTRAVENOUS
  Administered 2015-03-07: 140 ug via INTRAVENOUS
  Administered 2015-03-08: 0 ug via INTRAVENOUS
  Administered 2015-03-08: 30 ug via INTRAVENOUS
  Administered 2015-03-08: 10 ug via INTRAVENOUS
  Administered 2015-03-08: 0 ug via INTRAVENOUS
  Administered 2015-03-08: 10 ug via INTRAVENOUS
  Administered 2015-03-08: 40 ug via INTRAVENOUS
  Administered 2015-03-09 (×2): 10 ug via INTRAVENOUS
  Filled 2015-03-04 (×2): qty 25

## 2015-03-04 MED ORDER — PHENYLEPHRINE HCL 10 MG/ML IJ SOLN
0.0000 ug/min | INTRAMUSCULAR | Status: DC
  Administered 2015-03-04 (×2): 30 ug/min via INTRAVENOUS
  Administered 2015-03-05: 40 ug/min via INTRAVENOUS
  Administered 2015-03-05: 30 ug/min via INTRAVENOUS
  Filled 2015-03-04 (×4): qty 1

## 2015-03-04 MED ORDER — LIDOCAINE HCL (CARDIAC) 20 MG/ML IV SOLN
INTRAVENOUS | Status: DC | PRN
  Administered 2015-03-04: 50 mg via INTRAVENOUS

## 2015-03-04 MED ORDER — FENTANYL CITRATE (PF) 100 MCG/2ML IJ SOLN: 25.0000 ug | INTRAMUSCULAR | Status: DC | PRN

## 2015-03-04 MED ORDER — ONDANSETRON 4 MG PO TBDP
4.0000 mg | ORAL_TABLET | Freq: Four times a day (QID) | ORAL | Status: DC | PRN
  Filled 2015-03-04: qty 1

## 2015-03-04 MED ORDER — SIMETHICONE 80 MG PO CHEW
40.0000 mg | CHEWABLE_TABLET | Freq: Four times a day (QID) | ORAL | Status: DC | PRN
  Filled 2015-03-04: qty 1

## 2015-03-04 MED ORDER — EPHEDRINE SULFATE 50 MG/ML IJ SOLN
INTRAMUSCULAR | Status: DC | PRN
  Administered 2015-03-04: 10 mg via INTRAVENOUS

## 2015-03-04 MED ORDER — KCL IN DEXTROSE-NACL 20-5-0.45 MEQ/L-%-% IV SOLN
INTRAVENOUS | Status: DC
  Administered 2015-03-04 – 2015-03-05 (×2): via INTRAVENOUS
  Administered 2015-03-05: 100 mL/h via INTRAVENOUS
  Administered 2015-03-07 – 2015-03-08 (×3): via INTRAVENOUS
  Filled 2015-03-04 (×7): qty 1000

## 2015-03-04 MED ORDER — SODIUM CHLORIDE 0.9 % IJ SOLN: 9.0000 mL | INTRAMUSCULAR | Status: DC | PRN

## 2015-03-04 MED ORDER — CEFAZOLIN SODIUM-DEXTROSE 2-3 GM-% IV SOLR
2.0000 g | INTRAVENOUS | Status: AC
  Administered 2015-03-04: 2 g via INTRAVENOUS

## 2015-03-04 MED ORDER — ROPIVACAINE HCL 2 MG/ML IJ SOLN
6.0000 mL/h | INTRAMUSCULAR | Status: DC
  Administered 2015-03-04 – 2015-03-05 (×2): 6 mL/h via EPIDURAL
  Filled 2015-03-04 (×5): qty 200

## 2015-03-04 MED ORDER — ONDANSETRON HCL 4 MG/2ML IJ SOLN
INTRAMUSCULAR | Status: AC
  Filled 2015-03-04: qty 2

## 2015-03-04 MED ORDER — DIPHENHYDRAMINE HCL 12.5 MG/5ML PO ELIX: 12.5000 mg | ORAL_SOLUTION | Freq: Four times a day (QID) | ORAL | Status: DC | PRN

## 2015-03-04 MED ORDER — PHENYLEPHRINE HCL 10 MG/ML IJ SOLN
20.0000 mg | INTRAVENOUS | Status: DC | PRN
  Administered 2015-03-04: 10 ug/min via INTRAVENOUS

## 2015-03-04 MED ORDER — NALOXONE HCL 0.4 MG/ML IJ SOLN: 0.4000 mg | INTRAMUSCULAR | Status: DC | PRN

## 2015-03-04 MED ORDER — DEXAMETHASONE SODIUM PHOSPHATE 10 MG/ML IJ SOLN
INTRAMUSCULAR | Status: AC
  Filled 2015-03-04: qty 1

## 2015-03-04 MED ORDER — PROPOFOL 10 MG/ML IV BOLUS
INTRAVENOUS | Status: DC | PRN
  Administered 2015-03-04: 20 mg via INTRAVENOUS
  Administered 2015-03-04: 80 mg via INTRAVENOUS

## 2015-03-04 MED ORDER — DIPHENHYDRAMINE HCL 50 MG/ML IJ SOLN: 12.5000 mg | Freq: Four times a day (QID) | INTRAMUSCULAR | Status: DC | PRN

## 2015-03-04 MED ORDER — LACTATED RINGERS IV SOLN: INTRAVENOUS | Status: DC

## 2015-03-04 MED ORDER — PHENYLEPHRINE HCL 10 MG/ML IJ SOLN
INTRAMUSCULAR | Status: DC | PRN
  Administered 2015-03-04: 120 ug via INTRAVENOUS

## 2015-03-04 MED ORDER — PANTOPRAZOLE SODIUM 40 MG IV SOLR
40.0000 mg | Freq: Every day | INTRAVENOUS | Status: DC
  Administered 2015-03-04 – 2015-03-11 (×8): 40 mg via INTRAVENOUS
  Filled 2015-03-04 (×8): qty 40

## 2015-03-04 MED ORDER — LIDOCAINE HCL (CARDIAC) 20 MG/ML IV SOLN
INTRAVENOUS | Status: AC
  Filled 2015-03-04: qty 5

## 2015-03-04 MED ORDER — EVICEL 5 ML EX KIT
PACK | Freq: Once | CUTANEOUS | Status: DC
  Filled 2015-03-04: qty 1

## 2015-03-04 MED ORDER — STERILE WATER FOR IRRIGATION IR SOLN
Status: DC | PRN
  Administered 2015-03-04: 1500 mL

## 2015-03-04 MED ORDER — BUPIVACAINE-EPINEPHRINE (PF) 0.25% -1:200000 IJ SOLN
INTRAMUSCULAR | Status: AC
  Filled 2015-03-04: qty 30

## 2015-03-04 MED ORDER — MEPERIDINE HCL 50 MG/ML IJ SOLN: 6.2500 mg | INTRAMUSCULAR | Status: DC | PRN

## 2015-03-04 MED ORDER — SODIUM CHLORIDE 0.9 % IJ SOLN
INTRAMUSCULAR | Status: AC
  Filled 2015-03-04: qty 10

## 2015-03-04 MED ORDER — ONDANSETRON HCL 4 MG/2ML IJ SOLN
4.0000 mg | Freq: Four times a day (QID) | INTRAMUSCULAR | Status: DC | PRN
  Administered 2015-03-04 – 2015-03-07 (×4): 4 mg via INTRAVENOUS
  Filled 2015-03-04 (×5): qty 2

## 2015-03-04 MED ORDER — DIPHENHYDRAMINE HCL 50 MG/ML IJ SOLN
12.5000 mg | Freq: Four times a day (QID) | INTRAMUSCULAR | Status: DC | PRN
Start: 2015-03-04 — End: 2015-03-04

## 2015-03-04 MED ORDER — DIPHENHYDRAMINE HCL 12.5 MG/5ML PO ELIX
12.5000 mg | ORAL_SOLUTION | Freq: Four times a day (QID) | ORAL | Status: DC | PRN
  Administered 2015-03-05: 12.5 mg via ORAL
  Filled 2015-03-04: qty 5

## 2015-03-04 MED ORDER — BUPIVACAINE 0.25 % ON-Q PUMP DUAL CATH 300 ML
300.0000 mL | INJECTION | Status: DC
  Filled 2015-03-04: qty 300

## 2015-03-04 MED ORDER — PROPOFOL 10 MG/ML IV BOLUS
INTRAVENOUS | Status: AC
  Filled 2015-03-04: qty 20

## 2015-03-04 MED ORDER — POLYVINYL ALCOHOL 1.4 % OP SOLN
1.0000 [drp] | Freq: Three times a day (TID) | OPHTHALMIC | Status: DC | PRN
  Filled 2015-03-04: qty 15

## 2015-03-04 MED ORDER — SUGAMMADEX SODIUM 200 MG/2ML IV SOLN
INTRAVENOUS | Status: AC
  Filled 2015-03-04: qty 2

## 2015-03-04 MED ORDER — 0.9 % SODIUM CHLORIDE (POUR BTL) OPTIME
TOPICAL | Status: DC | PRN
  Administered 2015-03-04: 4000 mL
  Administered 2015-03-04: 1000 mL

## 2015-03-04 MED ORDER — MIDAZOLAM HCL 5 MG/5ML IJ SOLN
INTRAMUSCULAR | Status: DC | PRN
  Administered 2015-03-04: 2 mg via INTRAVENOUS

## 2015-03-04 MED ORDER — DEXAMETHASONE SODIUM PHOSPHATE 10 MG/ML IJ SOLN
INTRAMUSCULAR | Status: DC | PRN
  Administered 2015-03-04: 10 mg via INTRAVENOUS

## 2015-03-04 MED ORDER — ROCURONIUM BROMIDE 100 MG/10ML IV SOLN
INTRAVENOUS | Status: DC | PRN
  Administered 2015-03-04: 40 mg via INTRAVENOUS
  Administered 2015-03-04 (×2): 10 mg via INTRAVENOUS
  Administered 2015-03-04: 20 mg via INTRAVENOUS
  Administered 2015-03-04 (×2): 10 mg via INTRAVENOUS

## 2015-03-04 MED ORDER — MIDAZOLAM HCL 2 MG/2ML IJ SOLN
INTRAMUSCULAR | Status: AC
  Filled 2015-03-04: qty 2

## 2015-03-04 MED ORDER — PHENYLEPHRINE HCL 10 MG/ML IJ SOLN
INTRAMUSCULAR | Status: AC
Start: 2015-03-04 — End: 2015-03-04
  Filled 2015-03-04: qty 2

## 2015-03-04 MED ORDER — LACTATED RINGERS IV SOLN
INTRAVENOUS | Status: DC | PRN
  Administered 2015-03-04: 09:00:00 via INTRAVENOUS

## 2015-03-04 MED ORDER — FENTANYL CITRATE (PF) 100 MCG/2ML IJ SOLN
25.0000 ug | INTRAMUSCULAR | Status: DC | PRN
  Administered 2015-03-04 – 2015-03-05 (×4): 25 ug via INTRAVENOUS
  Filled 2015-03-04 (×4): qty 2

## 2015-03-04 MED ORDER — BUPIVACAINE HCL (PF) 0.25 % IJ SOLN
INTRAMUSCULAR | Status: DC | PRN
Start: 2015-03-04 — End: 2015-03-04
  Administered 2015-03-04: 6 mL via EPIDURAL

## 2015-03-04 MED ORDER — ATROPINE SULFATE 0.4 MG/ML IJ SOLN
INTRAMUSCULAR | Status: AC
  Filled 2015-03-04: qty 2

## 2015-03-04 MED ORDER — ACETAMINOPHEN 650 MG RE SUPP: 650.0000 mg | Freq: Four times a day (QID) | RECTAL | Status: DC | PRN

## 2015-03-04 MED ORDER — ACETAMINOPHEN 325 MG PO TABS
650.0000 mg | ORAL_TABLET | Freq: Four times a day (QID) | ORAL | Status: DC | PRN
  Administered 2015-03-05: 650 mg via ORAL
  Filled 2015-03-04: qty 2

## 2015-03-04 MED ORDER — HYDRALAZINE HCL 20 MG/ML IJ SOLN: 10.0000 mg | INTRAMUSCULAR | Status: DC | PRN

## 2015-03-04 MED ORDER — LIDOCAINE HCL (PF) 1 % IJ SOLN
INTRAMUSCULAR | Status: DC | PRN
  Administered 2015-03-04: 5 mL

## 2015-03-04 MED ORDER — PHENYLEPHRINE 40 MCG/ML (10ML) SYRINGE FOR IV PUSH (FOR BLOOD PRESSURE SUPPORT)
PREFILLED_SYRINGE | INTRAVENOUS | Status: AC
  Filled 2015-03-04: qty 10

## 2015-03-04 MED ORDER — BUPIVACAINE HCL (PF) 0.25 % IJ SOLN
INTRAMUSCULAR | Status: AC
  Filled 2015-03-04: qty 30

## 2015-03-04 MED ORDER — ROCURONIUM BROMIDE 100 MG/10ML IV SOLN
INTRAVENOUS | Status: AC
  Filled 2015-03-04: qty 1

## 2015-03-04 MED ORDER — ROPIVACAINE HCL 2 MG/ML IJ SOLN
8.0000 mL/h | INTRAMUSCULAR | Status: DC
  Administered 2015-03-04: 6 mL/h via EPIDURAL
  Filled 2015-03-04 (×2): qty 200

## 2015-03-04 SURGICAL SUPPLY — 107 items
BLADE EXTENDED COATED 6.5IN (ELECTRODE) ×2 IMPLANT
BLADE HEX COATED 2.75 (ELECTRODE) ×2 IMPLANT
BOOT SUTURE VASCULAR YLW (MISCELLANEOUS)
CATH KIT ON-Q SILVERSOAK 5IN (CATHETERS) IMPLANT
CATH KIT ON-Q SILVERSOAK 7.5IN (CATHETERS) IMPLANT
CATH ROBINSON RED A/P 16FR (CATHETERS) ×2 IMPLANT
CHLORAPREP W/TINT 26ML (MISCELLANEOUS) ×2 IMPLANT
CLAMP SUTURE YELLOW 5 PAIRS (MISCELLANEOUS) IMPLANT
CLIP LIGATING HEM O LOK PURPLE (MISCELLANEOUS) ×4 IMPLANT
CLIP LIGATING HEMO O LOK GREEN (MISCELLANEOUS) ×4 IMPLANT
CLIP LIGATING HEMOLOK MED (MISCELLANEOUS) ×2 IMPLANT
CLIP TI LARGE 6 (CLIP) ×6 IMPLANT
CLIP TI MEDIUM 6 (CLIP) ×4 IMPLANT
COUNTER NEEDLE 20 DBL MAG RED (NEEDLE) IMPLANT
COVER SURGICAL LIGHT HANDLE (MISCELLANEOUS) ×2 IMPLANT
CUTTER ECHEON FLEX ENDO 45 340 (ENDOMECHANICALS) ×2 IMPLANT
DECANTER SPIKE VIAL GLASS SM (MISCELLANEOUS) ×4 IMPLANT
DISSECTOR ROUND CHERRY 3/8 STR (MISCELLANEOUS) IMPLANT
DRAIN CHANNEL 19F RND (DRAIN) ×2 IMPLANT
DRAPE C-ARM 42X120 X-RAY (DRAPES) IMPLANT
DRAPE CAMERA CLOSED 9X96 (DRAPES) IMPLANT
DRAPE SHEET LG 3/4 BI-LAMINATE (DRAPES) IMPLANT
DRAPE UTILITY XL STRL (DRAPES) ×4 IMPLANT
DRAPE WARM FLUID 44X44 (DRAPE) ×2 IMPLANT
DRESSING TELFA ISLAND 4X8 (GAUZE/BANDAGES/DRESSINGS) ×2 IMPLANT
DRSG PAD ABDOMINAL 8X10 ST (GAUZE/BANDAGES/DRESSINGS) IMPLANT
DRSG TELFA 4X10 ISLAND STR (GAUZE/BANDAGES/DRESSINGS) ×2 IMPLANT
DRSG TELFA PLUS 4X6 ADH ISLAND (GAUZE/BANDAGES/DRESSINGS) IMPLANT
EVACUATOR SILICONE 100CC (DRAIN) ×2 IMPLANT
GAUZE SPONGE 2X2 8PLY STRL LF (GAUZE/BANDAGES/DRESSINGS) ×1 IMPLANT
GAUZE SPONGE 4X4 12PLY STRL (GAUZE/BANDAGES/DRESSINGS) IMPLANT
GAUZE SPONGE 4X4 16PLY XRAY LF (GAUZE/BANDAGES/DRESSINGS) IMPLANT
GLOVE BIO SURGEON STRL SZ 6 (GLOVE) ×2 IMPLANT
GLOVE INDICATOR 6.5 STRL GRN (GLOVE) ×4 IMPLANT
GOWN STRL REUS W/TWL 2XL LVL3 (GOWN DISPOSABLE) ×2 IMPLANT
GOWN STRL REUS W/TWL XL LVL3 (GOWN DISPOSABLE) ×2 IMPLANT
HANDLE SUCTION POOLE (INSTRUMENTS) ×1 IMPLANT
HEMOSTAT SURGICEL 4X8 (HEMOSTASIS) IMPLANT
KIT BASIN OR (CUSTOM PROCEDURE TRAY) ×2 IMPLANT
LOOP MINI RED (MISCELLANEOUS) ×2 IMPLANT
LOOP VESSEL MAXI BLUE (MISCELLANEOUS) ×2 IMPLANT
NEEDLE BIOPSY 14GX4.5 SOFT TIS (NEEDLE) IMPLANT
NEEDLE HYPO 22GX1.5 SAFETY (NEEDLE) IMPLANT
PACK GENERAL/GYN (CUSTOM PROCEDURE TRAY) ×2 IMPLANT
PACK UNIVERSAL I (CUSTOM PROCEDURE TRAY) ×2 IMPLANT
PAD TELFA 2X3 NADH STRL (GAUZE/BANDAGES/DRESSINGS) ×2 IMPLANT
RELOAD WH ECHELON 45 (STAPLE) ×10 IMPLANT
SCISSORS HARMONIC WAVE 18CM (INSTRUMENTS) ×2 IMPLANT
SEALER BIPOLAR AQUA 6.0 (INSTRUMENTS) IMPLANT
SEALER BIPOLAR AQUA 9.5 XL (INSTRUMENTS) IMPLANT
SET IRRIG TUBING LAPAROSCOPIC (IRRIGATION / IRRIGATOR) IMPLANT
SHEARS FOC LG CVD HARMONIC 17C (MISCELLANEOUS) IMPLANT
SHEARS HARMONIC 9CM CVD (BLADE) ×2 IMPLANT
SLEEVE SURGEON STRL (DRAPES) IMPLANT
SLEEVE XCEL OPT CAN 5 100 (ENDOMECHANICALS) IMPLANT
SPONGE DRAIN TRACH 4X4 STRL 2S (GAUZE/BANDAGES/DRESSINGS) ×2 IMPLANT
SPONGE GAUZE 2X2 STER 10/PKG (GAUZE/BANDAGES/DRESSINGS) ×1
SPONGE LAP 18X18 X RAY DECT (DISPOSABLE) ×16 IMPLANT
SPONGE SURGIFOAM ABS GEL 100 (HEMOSTASIS) IMPLANT
STAPLER VISISTAT 35W (STAPLE) ×2 IMPLANT
SUCTION POOLE HANDLE (INSTRUMENTS) ×2
SUT CHROMIC 3 0 SH 27 (SUTURE) IMPLANT
SUT CHROMIC 4 0 RB 1X27 (SUTURE) IMPLANT
SUT ETHILON 2 0 PS N (SUTURE) ×2 IMPLANT
SUT MNCRL AB 4-0 PS2 18 (SUTURE) IMPLANT
SUT PDS AB 1 CTX 36 (SUTURE) IMPLANT
SUT PDS AB 1 TP1 54 (SUTURE) IMPLANT
SUT PDS AB 1 TP1 96 (SUTURE) ×6 IMPLANT
SUT PROLENE 3 0 SH 48 (SUTURE) IMPLANT
SUT PROLENE 3 0 SH1 36 (SUTURE) IMPLANT
SUT PROLENE 4 0 RB 1 (SUTURE) ×7
SUT PROLENE 4-0 RB1 .5 CRCL 36 (SUTURE) ×7 IMPLANT
SUT PROLENE 5 0 CC 1 (SUTURE) IMPLANT
SUT SILK 0 FSL (SUTURE) ×2 IMPLANT
SUT SILK 2 0 (SUTURE) ×1
SUT SILK 2 0 SH CR/8 (SUTURE) ×2 IMPLANT
SUT SILK 2-0 18XBRD TIE 12 (SUTURE) ×1 IMPLANT
SUT SILK 3 0 (SUTURE)
SUT SILK 3 0 SH CR/8 (SUTURE) IMPLANT
SUT SILK 3-0 18XBRD TIE 12 (SUTURE) IMPLANT
SUT VIC AB 2-0 CTX 36 (SUTURE) ×6 IMPLANT
SUT VIC AB 3-0 SH 18 (SUTURE) IMPLANT
SUT VIC AB 4-0 SH 18 (SUTURE) IMPLANT
SUT VICRYL 2 0 18  UND BR (SUTURE) ×1
SUT VICRYL 2 0 18 UND BR (SUTURE) ×1 IMPLANT
SUT VICRYL 3 0 BR 18  UND (SUTURE) ×1
SUT VICRYL 3 0 BR 18 UND (SUTURE) ×1 IMPLANT
SYR CONTROL 10ML LL (SYRINGE) ×2 IMPLANT
SYRINGE 10CC LL (SYRINGE) IMPLANT
SYS LAPSCP GELPORT 120MM (MISCELLANEOUS)
SYSTEM LAPSCP GELPORT 120MM (MISCELLANEOUS) IMPLANT
TAG SUTURE CLAMP YLW 5PR (MISCELLANEOUS)
TAPE UMBILICAL COTTON 1/8X30 (MISCELLANEOUS) ×2 IMPLANT
TOWEL BLUE STERILE X RAY DET (MISCELLANEOUS) IMPLANT
TOWEL OR 17X26 10 PK STRL BLUE (TOWEL DISPOSABLE) ×4 IMPLANT
TOWEL OR NON WOVEN STRL DISP B (DISPOSABLE) ×4 IMPLANT
TRAY FOLEY W/METER SILVER 14FR (SET/KITS/TRAYS/PACK) ×2 IMPLANT
TRAY FOLEY W/METER SILVER 16FR (SET/KITS/TRAYS/PACK) ×2 IMPLANT
TROCAR BLADELESS OPT 5 100 (ENDOMECHANICALS) IMPLANT
TROCAR XCEL 12X100 BLDLESS (ENDOMECHANICALS) ×2 IMPLANT
TROCAR XCEL BLUNT TIP 100MML (ENDOMECHANICALS) IMPLANT
TROCAR XCEL NON-BLD 11X100MML (ENDOMECHANICALS) IMPLANT
TUBE FEEDING 5FR 36IN KANGAROO (TUBING) IMPLANT
TUBE FEEDING 8FR 16IN STR KANG (MISCELLANEOUS) ×2 IMPLANT
TUBING INSUFFLATION 10FT LAP (TUBING) IMPLANT
TUNNELER SHEATH ON-Q 16GX12 DP (PAIN MANAGEMENT) IMPLANT
YANKAUER SUCT BULB TIP NO VENT (SUCTIONS) ×2 IMPLANT

## 2015-03-04 NOTE — H&P (Signed)
Tina Bailey. [name] Location: [facility] Patient #: [id number] DOB: Jun 08, 1945 Undefined / Language: Cleophus Bailey / Race: White Female   History of Present Illness  The patient is a 69 year old female who presents with hepatic cancer. Patient is a 69 year old female who presented with abdominal pain. She is referred by Dr. Burr Medico. She has had this pain for 4 years and ultrasound showed gallstones. She had continued to have an increase in pain in the last 8-9 months, decreased appetite, fatigue, and weight loss. She went to see vascular surgery regarding a blue toe. CT angiogram demonstrated a 6.8 liver mass and a pancreatic lesion. MRI was performed which demonstrated that the pancreatic mass was likely a complex cyst. The liver mass was in 5 and 4B with capsular retraction. She had a mildly enlarged portal caval node. There were no findings indicative of severe cirrhosis or portal hypertension.  CBC normal, coags normal. Chemistries are significant for a CO2 of 30. MRI IMPRESSION: 1. The first lesion of concern is a fairly vascular mass spanning segments 5 and 4B of the liver with capsule or pseudocapsule appearance, and central enhancement which spreads peripherally on the later images, and also with associated hepatic capsular retraction. Differential diagnostic considerations for this lesion include peripheral cholangiocarcinoma ; hepatic epitheliod hemangioendothelioma ; hepatocellular carcinoma ; or treated metastatic disease. Biopsy is likely warranted. 2. The second lesion of concern is a 1.2 cm pancreatic body lesion with high T2 and low T1 signal characteristics, questionable attachment to the dorsal pancreatic duct, and some internal septation and probably some faint nodular enhancement internally. The appearance on CT, for example image 62 series 9, is somewhat more concerning for internal enhancement, although measurable enhancement is present on MRI. This could be a small macro cystic  tumor with solid component; the primary ductal pancreatic adenocarcinoma with cystic degeneration; were cystic degeneration of an islet cell tumor such as insulinoma or glucagon adenoma. Metastatic disease to the pancreas could possibly appear this way. Intraductal papillary mucinous neoplasm with small solid component could also appear this way. Biopsy, surveillance, or further imaging characterization with nuclear medicine PET-CT may be warranted. 3. Cholelithiasis. 4. Mild biliary dilatation. 5. Mildly enlarged portacaval lymph node. In the setting of the hepatic and pancreatic lesions the appearance raises concern for the possibility of early local metastatic disease. This could also be further characterized at PET-CT. Chest CT IMPRESSION: 1. Solitary 3 mm ground-glass pulmonary nodule in the right upper lobe. Recommend attention on follow-up chest CT in 3 months. 2. Mild right pericardiophrenic lymphadenopathy, possibly metastatic. 3. Mild centrilobular emphysema. 4. Nondisplaced healing subacute lateral left fifth rib fracture. 5. Right thyroid lobe 1.8 cm nodule. Recommend correlation with thyroid ultrasound. This follows ACR consensus guidelines: Managing Incidental Thyroid Nodules Detected on Imaging: White Paper of the ACR Incidental Thyroid Findings Committee. J Am Coll Radiol 2015; 12:143-150. pathology Diagnosis Liver, needle/core biopsy - HEPATOCELLULAR CARCINOMA. - SEE MICROSCOPIC DESCRIPTION. Microscopic Comment The core biopsies are involved by carcinoma with hepatoid features. The tumor is positive with Hep Par 1, cytokeratin 8/18, cytokeratin 7, and MOC-31. The tumor is negative with cytokeratin 20, CDX2, thyroid transcription factor-1, Napsin A, estrogen receptor and gross cystic disease fluid protein. The morphology and immunophenotype are consistent with hepatocellular carcinoma   Problem List/Past Medical COPD (CHRONIC OBSTRUCTIVE PULMONARY DISEASE) (J44.9) OSTEOPOROSIS  (M81.0) ARTHRITIS (M19.90) FIBROMYALGIA (M79.7) MACULAR DEGENERATION (H35.30) BLUE TOE SYNDROME, UNSPECIFIED LATERALITY (I75.029)  Past Surgical History Hysterectomy (not due to cancer) - Partial Other Surgery kidney  stone surgery Coronary Angioplasty  Allergies  PARoxetine HCl *ANTIDEPRESSANTS* Sulfamethoxazole *Sulfonamides**  Medication History Excedrin (250-250-65MG  Tablet, Oral) Active. (prn) Aspirin (81MG  Tablet DR, Oral daily) Active.  Social History  Tobacco / smoke exposure 45 pack years. cigarettes NOT interested in quitting now. No alcohol use  Family History  Heart Disease Hypertension Dementia    Review of Systems   Note: positive for fatigue, 10LB weight loss, abdominal pain. denies jaundice.   Vitals Weight: 114.3 lb Height: 65in Body Surface Area: 1.54 m Body Mass Index: 19.02 kg/m  Temp.: 98.44F  Pulse: 84 (Regular)  Resp.: 20 (Unlabored)  P.OX: 97% (Room air) BP: 148/70 (Sitting, Left Arm, Standard)     Physical Exam  General Mental Status-Alert. General Appearance-Consistent with stated age. Hydration-Well hydrated. Voice-Normal. Note: smells strongly of smoke. Thin.   Head and Neck Head-normocephalic, atraumatic with no lesions or palpable masses. Trachea-midline. Thyroid Gland Characteristics - normal size and consistency.  Eye Eyeball - Bilateral-Extraocular movements intact. Sclera/Conjunctiva - Bilateral-No scleral icterus.  Chest and Lung Exam Chest and lung exam reveals -quiet, even and easy respiratory effort with no use of accessory muscles and on auscultation, normal breath sounds, no adventitious sounds and normal vocal resonance. Inspection Chest Wall - Normal. Back - normal.  Cardiovascular Cardiovascular examination reveals -normal heart sounds, regular rate and rhythm with no murmurs and normal pedal pulses bilaterally.  Abdomen Inspection Inspection of the  abdomen reveals - No Hernias. Palpation/Percussion Palpation and Percussion of the abdomen reveal - Soft, No Rebound tenderness, No Rigidity (guarding) and No hepatosplenomegaly. Note: tender in epigastric region and RUQ. Auscultation Auscultation of the abdomen reveals - Bowel sounds normal.  Neurologic Neurologic evaluation reveals -alert and oriented x 3 with no impairment of recent or remote memory. Mental Status-Normal.  Musculoskeletal Global Assessment -Note: no gross deformities.  Normal Exam - Left-Upper Extremity Strength Normal and Lower Extremity Strength Normal. Normal Exam - Right-Upper Extremity Strength Normal and Lower Extremity Strength Normal.  Lymphatic Head & Neck  General Head & Neck Lymphatics: Bilateral - Description - Normal. Axillary  General Axillary Region: Bilateral - Description - Normal. Tenderness - Non Tender. Femoral & Inguinal  Generalized Femoral & Inguinal Lymphatics: Bilateral - Description - No Generalized lymphadenopathy.    Assessment & Plan  PRIMARY HEPATOCELLULAR CARCINOMA OF LIVER (C22.0) Impression: We will tentatively plan portal vein embolization followed by surgery in 4-6 weeks.  I will send her to see IR to discuss PVE and other possible therapies like TACE and Y90 embolization. Timing would be late november. They desire surgery at [facility].  The surgery was discussed with the patient with diagrams of anatomy. I reviewed the rationale for surgery, possible alternative options, possibility of having to abort the procedure, hospital course, post op restrictions, possible post op complications, possible need for post hospital stay at a nursing home or rehab, and possible death.  The complications can include: This is a very extensive operation and includes complications listed below: Bleeding Infection and possible wound complications such as hernia Damage to adjacent structures Leak of bile from the surface of the  liver Possible need for other procedures, such as abscess drains in radiology or endoscopy. Possible prolonged hospital stay MOST PATIENTS' ENERGY LEVEL IS NOT BACK TO NORMAL FOR AT LEAST 4-6 MONTHS. OLDER PATIENTS MAY FEEL WEAK FOR LONGER PERIODS OF TIME. Difficulty with eating or post operative nausea (around 30%) Possible early recurrence of cancer Possible complications of your medical problems such as heart disease or arrhythmias. Death (less  than 2%)  60 min spent in evaluation, examination, counseling, and coordination of care. >50% spent in counseling. Current Plans Pt Education - flb hepatectomy: discussed with patient and provided information. You are being scheduled for surgery - Our schedulers will call you.  You should hear from our office's scheduling department within 5 working days about the location, date, and time of surgery. We try to make accommodations for patient's preferences in scheduling surgery, but sometimes the OR schedule or the surgeon's schedule prevents [place] from making those accommodations.  If you have not heard from our office [phone]) in 5 working days, call the office and ask for your surgeon's nurse.  If you have other questions about your diagnosis, plan, or surgery, call the office and ask for your surgeon's nurse.  Referred to Interventional Radiology (IR), for evaluation and follow up (Radiology). Referred to Cardiology, for evaluation and follow up (Cardiology). Pt Education - [facility] Free Text Education/Instructions: discussed with patient and provided information. Follow up in 3 weeks or as needed  TOBACCO ABUSE COUNSELING (Z71.6) Impression: Patient not ready to quit. I discussed reasons to quit including higher risk of hernia, higher risk of wound infection, and higher risk of pneumonia. PERIPHERAL VASCULAR DISEASE (I73.9) Impression: Because of her history of peripheral vascular disease, smoking, and history of "angioplasty," I will refer  her to cardiology pre operatively for risk assessment.

## 2015-03-04 NOTE — Anesthesia Preprocedure Evaluation (Addendum)
Anesthesia Evaluation  Patient identified by MRN, date of birth, ID band Patient awake    Reviewed: Allergy & Precautions, NPO status , Patient's Chart, lab work & pertinent test results  History of Anesthesia Complications (+) PONV  Airway Mallampati: II  TM Distance: >3 FB Neck ROM: Full    Dental no notable dental hx. (+) Poor Dentition, Missing   Pulmonary COPD, Current Smoker,    Pulmonary exam normal breath sounds clear to auscultation       Cardiovascular negative cardio ROS Normal cardiovascular exam Rhythm:Regular Rate:Normal  Negative stress test 03/03/15   Neuro/Psych negative neurological ROS  negative psych ROS   GI/Hepatic negative GI ROS, Neg liver ROS,   Endo/Other  negative endocrine ROS  Renal/GU negative Renal ROS  negative genitourinary   Musculoskeletal negative musculoskeletal ROS (+) Fibromyalgia -  Abdominal   Peds negative pediatric ROS (+)  Hematology negative hematology ROS (+)   Anesthesia Other Findings   Reproductive/Obstetrics negative OB ROS                            Anesthesia Physical Anesthesia Plan  ASA: III  Anesthesia Plan: General   Post-op Pain Management: GA combined w/ Regional for post-op pain   Induction: Intravenous  Airway Management Planned: Oral ETT  Additional Equipment: Arterial line  Intra-op Plan:   Post-operative Plan: Extubation in OR and Possible Post-op intubation/ventilation  Informed Consent: I have reviewed the patients History and Physical, chart, labs and discussed the procedure including the risks, benefits and alternatives for the proposed anesthesia with the patient or authorized representative who has indicated his/her understanding and acceptance.   Dental advisory given  Plan Discussed with: CRNA  Anesthesia Plan Comments: (Epidural for post op pain)        Anesthesia Quick Evaluation

## 2015-03-04 NOTE — Anesthesia Postprocedure Evaluation (Signed)
Anesthesia Post Note  Patient: SAMATHA LOMBERA  Procedure(s) Performed: Procedure(s) (LRB): DIAGNOSTIC LAPAROSCOPY, EXTENDED RIGHT HEPATECTOMY, WITH INTRAOPERATIVE ULTRASOUND (N/A)  Patient location during evaluation: PACU Anesthesia Type: General Level of consciousness: awake and alert Pain management: pain level controlled Vital Signs Assessment: post-procedure vital signs reviewed and stable Respiratory status: spontaneous breathing, nonlabored ventilation, respiratory function stable and patient connected to nasal cannula oxygen Cardiovascular status: blood pressure returned to baseline and stable Postop Assessment: no signs of nausea or vomiting Anesthetic complications: no    Last Vitals:  Filed Vitals:   03/04/15 1830 03/04/15 1959  BP:    Pulse: 72   Temp:  36.1 C  Resp: 11     Last Pain:  Filed Vitals:   03/04/15 1959  PainSc: Asleep                 Montez Hageman

## 2015-03-04 NOTE — Transfer of Care (Signed)
Immediate Anesthesia Transfer of Care Note  Patient: Tina Bailey  Procedure(s) Performed: Procedure(s): DIAGNOSTIC LAPAROSCOPY, EXTENDED RIGHT HEPATECTOMY, WITH INTRAOPERATIVE ULTRASOUND (N/A)  Patient Location: PACU  Anesthesia Type:General  Level of Consciousness: awake, alert , oriented and patient cooperative  Airway & Oxygen Therapy: Patient Spontanous Breathing and Patient connected to face mask oxygen  Post-op Assessment: Report given to RN, Post -op Vital signs reviewed and stable and Patient moving all extremities  Post vital signs: Reviewed and stable  Last Vitals:  Filed Vitals:   03/04/15 0900 03/04/15 0913  BP: 142/70 86/75  Pulse: 82 84    Complications: No apparent anesthesia complications

## 2015-03-04 NOTE — Op Note (Signed)
PREOPERATIVE DIAGNOSIS:  Hepatocellular cancer.  POSTOPERATIVE DIAGNOSIS:  Same  PROCEDURE PERFORMED:  Diagnostic laparoscopy, liver biopsies, intraoperative liver ultrasound, extended right hepatic lobectomy, portal lymph node dissection, placement of biliary stent  SURGEON:  Stark Klein, MD  ASSISTANT:  Frederich Cha, MD  ANESTHESIA:  General and local.  FINDINGS:  Large umbilicated tumor in right liver extending into segment 4a and 4b  SPECIMEN:   Superficial left liver biopsies, posterior left liver biopsy, portal lymph nodes, posterior portal lymph nodes, extended right lobe of the liver to Pathology.  ESTIMATED BLOOD LOSS:  600 mL.  COMPLICATIONS:  None known.    PROCEDURE:  Patient was identified in the holding area and taken to the operating room where she was placed supine on the operating room table.  General endotracheal anesthesia was induced.  Her abdomen was prepped and draped in a sterile fashion.  A time-out was performed according to the surgical safety check list.  When all was correct, we continued.  The left subcostal region was anesthetized with local anesthetic and a small 6 mm incision was made with a #11 blade. Optiview port was placed without difficulty.  Pneumoperitoneum was achieved.  The camera was placed in the abdomen and no overt signs of carcinomatosis were seen.    A 12 mm port was placed in the midline, and an additional 5 mm port was placed in the left mid abdomen.  Several small lesions were biopsied with the laparoscopic biopsy forceps.  The liver was mobile. The ultrasound was used to segmentally image the liver.  The left side appeared clear of disease.  The right portal vein appeared to be free of tumor.  The hepatic veins were patent.  Dr. Laurence Ferrari also came to look.  There appeared to be hypertrophy of the left.    Frozens returned negative.  A right subcostal incision was made extending into the midline.  The subcutaneous tissues were  divided with the Bovie and then the fascia was opened with the Bovie as well. The Bookwalter was then placed for assistance with visualization.  The liver was mobilized.  The superior adhesions were taken down.  The gallbladder was left en bloc with the liver, but the cystic duct and artery were isolated, ligated, and divided.  Once the porta hepatis was exposed, the liver was retracted gently with Sweetheart retractor.  The gastrohepatic ligament was opened.  There was no sign of any replaced left hepatic artery or replaced right hepatic artery.  The right hepatic artery was identified as well as the left.  The bifurcation was reasonably low. The left and right hepatic ducts were identified and traced out.  Several portal lymph nodes were taken off the porta and sent for frozen.     Vessel loops were used to isolate the right duct and artery.  The right portal vein bifurcation was high up in the hepatic plate.  This was skeletonized.  The right hepatic vein was isolated carefully with the [facility] clamp. An umbilical tape was passed around it.  A small branch tore off and had to be repaired.      At this point, the right  hepatic artery was test clamped and the doppler was used to confirm blood flow to the left liver.  The hepatic artery was divided with a 2-0 tie, suture ligated, and then clipped.  The portal vein was tied, suture ligated, and clipped.  The right hepatic duct was clipped and divided.  The line of demarcation was seen.  The left hepatic vein was divided with a vascular load of the echelon stapler. The liver was scored at the line of demarcation with the Bovie.  The harmonic scalpel wave was then used to divide the bulk of the liver parenchyma.  The vascular pedicles were divided with Echelon staple loads, three of which were used on main portal branch as well as the segment of 4 branches coming off of the middle hepatic vein.  The liver was taken completely off.    The clip from the  right hepatic duct came off the main bile duct.  A 8 Fr pediatric feeding tube was placed as a stent, and the defect was closed transversely with 4-0 prolene.    The argon beam coagulator was used to coagulate the liver bed over a piece of surgicel.   This also was irrigated with water.  A 19 Blake drain was placed in the abdomen from the RLQ. There was still no bile leakage seen after waiting and evaluating the liver bed with a dry wrap.  Evicel was then applied to the liver edge and the Blake drain was placed going around the cut edge of the surface and over into the hepatic fossa.    A single layer of #1 looped PDS suture was created in a running fashion to reapproximate the fascia.  The Monett drain was then in place with a 2-0 nylon.  The skin was then irrigated and stapled. The wounds were cleaned, dried and dressed with Tegaderm.  The patient was extubated and taken to PACU in stable condition.     Stark Klein, MD

## 2015-03-04 NOTE — Anesthesia Procedure Notes (Addendum)
Epidural Patient location during procedure: holding area  Staffing Performed by: anesthesiologist   Preanesthetic Checklist Completed: patient identified, site marked, surgical consent, pre-op evaluation, timeout performed, IV checked, risks and benefits discussed, monitors and equipment checked and post-op pain management  Epidural Patient position: sitting Prep: Betadine Patient monitoring: heart rate, cardiac monitor, continuous pulse ox and blood pressure Approach: right paramedian Location: thoracic (1-12) Injection technique: LOR saline  Needle:  Needle type: Hustead  Needle gauge: 17 G Needle length: 9 cm Needle insertion depth: 4 cm Catheter type: closed end flexible Catheter size: 20 Guage Catheter at skin depth: 9 cm Test dose: 1.5% lidocaine with Epi 1:200 K  Assessment Events: blood not aspirated, injection not painful, no injection resistance, negative IV test and no paresthesia  Additional Notes Reason for block:post-op pain management  Procedure Name: Intubation Date/Time: 03/04/2015 9:19 AM Performed by: Carleene Cooper A Pre-anesthesia Checklist: Patient identified, Emergency Drugs available, Suction available and Patient being monitored Patient Re-evaluated:Patient Re-evaluated prior to inductionOxygen Delivery Method: Circle system utilized Preoxygenation: Pre-oxygenation with 100% oxygen Intubation Type: IV induction Ventilation: Mask ventilation without difficulty Grade View: Grade I Tube type: Oral Tube size: 7.5 mm Number of attempts: 1 Airway Equipment and Method: Stylet Placement Confirmation: ETT inserted through vocal cords under direct vision,  positive ETCO2 and breath sounds checked- equal and bilateral Secured at: 19 cm Tube secured with: Tape Dental Injury: Teeth and Oropharynx as per pre-operative assessment

## 2015-03-04 NOTE — Telephone Encounter (Signed)
Called [facility] and had LMTCB x1 with receptionists.

## 2015-03-05 LAB — COMPREHENSIVE METABOLIC PANEL
ALT: 476 U/L — ABNORMAL HIGH (ref 14–54)
AST: 537 U/L — ABNORMAL HIGH (ref 15–41)
Albumin: 2.5 g/dL — ABNORMAL LOW (ref 3.5–5.0)
Alkaline Phosphatase: 70 U/L (ref 38–126)
Anion gap: 4 — ABNORMAL LOW (ref 5–15)
BUN: 10 mg/dL (ref 6–20)
CO2: 24 mmol/L (ref 22–32)
Calcium: 8 mg/dL — ABNORMAL LOW (ref 8.9–10.3)
Chloride: 104 mmol/L (ref 101–111)
Creatinine, Ser: 0.41 mg/dL — ABNORMAL LOW (ref 0.44–1.00)
GFR calc Af Amer: >60 mL/min (ref >60)
GFR calc non Af Amer: >60 mL/min (ref >60)
Glucose, Bld: 206 mg/dL — ABNORMAL HIGH (ref 65–99)
Potassium: 4.7 mmol/L (ref 3.5–5.1)
Sodium: 132 mmol/L — ABNORMAL LOW (ref 135–145)
Total Bilirubin: 1.4 mg/dL — ABNORMAL HIGH (ref 0.3–1.2)
Total Protein: 4.2 g/dL — ABNORMAL LOW (ref 6.5–8.1)

## 2015-03-05 LAB — POCT I-STAT 7, (LYTES, BLD GAS, ICA,H+H)
Acid-base deficit: 5 mmol/L — ABNORMAL HIGH (ref 0.0–2.0)
Bicarbonate: 22.5 mEq/L (ref 20.0–24.0)
Calcium, Ion: 1.04 mmol/L — ABNORMAL LOW (ref 1.13–1.30)
HCT: 33 % — ABNORMAL LOW (ref 36.0–46.0)
Hemoglobin: 11.2 g/dL — ABNORMAL LOW (ref 12.0–15.0)
O2 Saturation: 100 %
Patient temperature: 34.8
Potassium: 3.4 mmol/L — ABNORMAL LOW (ref 3.5–5.1)
Sodium: 135 mmol/L (ref 135–145)
TCO2: 24 mmol/L (ref 0–100)
pCO2 arterial: 46.1 mmHg — ABNORMAL HIGH (ref 35.0–45.0)
pH, Arterial: 7.285 — ABNORMAL LOW (ref 7.350–7.450)
pO2, Arterial: 211 mmHg — ABNORMAL HIGH (ref 80.0–100.0)

## 2015-03-05 LAB — PHOSPHORUS: Phosphorus: 2.1 mg/dL — ABNORMAL LOW (ref 2.5–4.6)

## 2015-03-05 LAB — CBC
HCT: 28.2 % — ABNORMAL LOW (ref 36.0–46.0)
Hemoglobin: 9.4 g/dL — ABNORMAL LOW (ref 12.0–15.0)
MCH: 30 pg (ref 26.0–34.0)
MCHC: 33.3 g/dL (ref 30.0–36.0)
MCV: 90.1 fL (ref 78.0–100.0)
Platelets: 212 10*3/uL (ref 150–400)
RBC: 3.13 MIL/uL — ABNORMAL LOW (ref 3.87–5.11)
RDW: 13.6 % (ref 11.5–15.5)
WBC: 13.3 10*3/uL — ABNORMAL HIGH (ref 4.0–10.5)

## 2015-03-05 LAB — PROTIME-INR
INR: 1.49 (ref 0.00–1.49)
Prothrombin Time: 18.1 seconds — ABNORMAL HIGH (ref 11.6–15.2)

## 2015-03-05 LAB — MAGNESIUM: Magnesium: 1.5 mg/dL — ABNORMAL LOW (ref 1.7–2.4)

## 2015-03-05 MED ORDER — ALBUMIN HUMAN 5 % IV SOLN
25.0000 g | Freq: Once | INTRAVENOUS | Status: AC
  Administered 2015-03-05: 25 g via INTRAVENOUS
  Filled 2015-03-05: qty 500

## 2015-03-05 MED ORDER — LORAZEPAM BOLUS VIA INFUSION: 0.5000 mg | Freq: Three times a day (TID) | INTRAVENOUS | Status: DC | PRN

## 2015-03-05 MED ORDER — LORAZEPAM 2 MG/ML IJ SOLN
0.5000 mg | Freq: Three times a day (TID) | INTRAMUSCULAR | Status: DC | PRN
  Administered 2015-03-07: 0.5 mg via INTRAVENOUS
  Filled 2015-03-05: qty 1

## 2015-03-05 MED ORDER — MAGNESIUM SULFATE 4 GM/100ML IV SOLN
4.0000 g | Freq: Once | INTRAVENOUS | Status: AC
  Administered 2015-03-05: 4 g via INTRAVENOUS
  Filled 2015-03-05: qty 100

## 2015-03-05 MED ORDER — ALBUMIN HUMAN 25 % IV SOLN
25.0000 g | Freq: Four times a day (QID) | INTRAVENOUS | Status: AC
  Administered 2015-03-05 – 2015-03-07 (×8): 25 g via INTRAVENOUS
  Filled 2015-03-05: qty 100
  Filled 2015-03-05 (×7): qty 50
  Filled 2015-03-05: qty 100
  Filled 2015-03-05: qty 50
  Filled 2015-03-05: qty 100

## 2015-03-05 MED ORDER — SODIUM PHOSPHATE 3 MMOLE/ML IV SOLN
30.0000 mmol | Freq: Once | INTRAVENOUS | Status: AC
  Administered 2015-03-05: 30 mmol via INTRAVENOUS
  Filled 2015-03-05: qty 10

## 2015-03-05 NOTE — Care Management Note (Signed)
Case Management Note  Patient Details  Name: Tina Bailey MRN: [id number] Date of Birth: 1945/04/09  Subjective/Objective:   Diagnostic laparoscopy, liver biopsies, intraoperative liver ultrasound, extended right hepatic lobectomy, portal lymph node dissection, placement of biliary stent                 Action/Plan: will follow for discharge planning needs   Expected Discharge Date:                  Expected Discharge Plan:     In-House Referral:     Discharge planning Services     Post Acute Care Choice:    Choice offered to:     DME Arranged:    DME Agency:     HH Arranged:    [place] Agency:     Status of Service:     Medicare Important Message Given:    Date Medicare IM Given:    Medicare IM give by:    Date Additional Medicare IM Given:    Additional Medicare Important Message give by:     If discussed at [place] of Stay Meetings, dates discussed:    Additional Comments:  Leeroy Cha, RN 03/05/2015, 1:07 PM

## 2015-03-05 NOTE — Progress Notes (Signed)
Inpatient Diabetes Program Recommendations  AACE/ADA: New Consensus Statement on Inpatient Glycemic Control (2015)  Target Ranges:  Prepandial:   less than 140 mg/dL      Peak postprandial:   less than 180 mg/dL (1-2 hours)      Critically ill patients:  140 - 180 mg/dL   Review of Glycemic Control  Diabetes history: None Current orders for Inpatient glycemic control: None  Inpatient Diabetes Program Recommendations: Correction (SSI): Per H&P Hepatic Cancer. Fasting lab glucose this am was 206 mg/dl at 0500. Please consider checking CBGs and possibly starting Novolog Sensitive correction with meals.  Thanks,  Tama Headings RN, MSN, [facility] Inpatient Diabetes Coordinator Team Pager [phone] (8a-5p)

## 2015-03-05 NOTE — Progress Notes (Signed)
1 Day Post-Op  Subjective: Still on a little neosynephrine.  Was kind of sleepy overnight.  More awake now and hurting.    Objective: Vital signs in last 24 hours: Temp:  [94.8 F (34.9 C)-97 F (36.1 C)] 96.2 F (35.7 C) (12/15 0347) Pulse Rate:  [66-87] 72 (12/15 0700) Resp:  [8-28] 17 (12/15 0700) BP: (77-142)/(32-78) 123/39 mmHg (12/15 0700) SpO2:  [92 %-100 %] 100 % (12/15 0700) Arterial Line BP: (59-133)/(31-84) 93/64 mmHg (12/15 0700) Weight:  [49.612 kg (109 lb 6 oz)] 49.612 kg (109 lb 6 oz) (12/14 0739) Last BM Date:  (PTA)  Intake/Output from previous day: 12/14 0701 - 12/15 0700 In: 7416 [I.V.:7120; IV Piggyback:200] Out: 2070 [Urine:975; Drains:495; Blood:600] Intake/Output this shift:    General appearance: alert, appears older than stated age, mild distress and anxious Resp: breathing comfortably Cardio: regular rate and rhythm GI: soft, approp tender, dressing wtih minimal staining.   JP bloody, ? Bile tinge.  Lab Results:   Recent Labs  03/04/15 1140 03/05/15 0500  WBC  --  13.3*  HGB 11.2* 9.4*  HCT 33.0* 28.2*  PLT  --  212   BMET  Recent Labs  03/04/15 1140 03/05/15 0500  NA 135 132*  K 3.4* 4.7  CL  --  104  CO2  --  24  GLUCOSE  --  206*  BUN  --  10  CREATININE  --  0.41*  CALCIUM  --  8.0*   PT/INR  Recent Labs  03/05/15 0500  LABPROT 18.1*  INR 1.49   ABG  Recent Labs  03/04/15 1140  PHART 7.285*  HCO3 22.5    Studies/Results: No results found.  Anti-infectives: Anti-infectives    Start     Dose/Rate Route Frequency Ordered Stop   03/04/15 1800  ciprofloxacin (CIPRO) IVPB 400 mg     400 mg 200 mL/hr over 60 Minutes Intravenous Every 12 hours 03/04/15 1602 03/04/15 1900   03/04/15 0722  ceFAZolin (ANCEF) IVPB 2 g/50 mL premix     2 g 100 mL/hr over 30 Minutes Intravenous On call to O.R. 03/04/15 [id number] 03/04/15 0921      Assessment/Plan: s/p Procedure(s): DIAGNOSTIC LAPAROSCOPY, EXTENDED RIGHT HEPATECTOMY,  WITH INTRAOPERATIVE ULTRASOUND (N/A) PAS Continue foley due to patient critically ill and urinary output monitoring Add albumin for resuscitation.  Sips of clears. Nicotine patch for tobacco abuse. Replete magnesium for hypomagnesemia. Albumin for hypotension, hypovolemia, and hypoalbuminemia. ABL anemia - anticipate adding lovenox tomorrow if blood counts stable. Hypophosphatemia - replete COPD - pumonary toilet.  Nebs/inhalers.   LOS: 1 day    [facility] 03/05/2015

## 2015-03-06 LAB — CBC
HCT: 23.5 % — ABNORMAL LOW (ref 36.0–46.0)
Hemoglobin: 7.8 g/dL — ABNORMAL LOW (ref 12.0–15.0)
MCH: 29.7 pg (ref 26.0–34.0)
MCHC: 33.2 g/dL (ref 30.0–36.0)
MCV: 89.4 fL (ref 78.0–100.0)
Platelets: 142 10*3/uL — ABNORMAL LOW (ref 150–400)
RBC: 2.63 MIL/uL — ABNORMAL LOW (ref 3.87–5.11)
RDW: 13.9 % (ref 11.5–15.5)
WBC: 20.7 10*3/uL — ABNORMAL HIGH (ref 4.0–10.5)

## 2015-03-06 LAB — COMPREHENSIVE METABOLIC PANEL
ALT: 392 U/L — ABNORMAL HIGH (ref 14–54)
AST: 274 U/L — ABNORMAL HIGH (ref 15–41)
Albumin: 3.6 g/dL (ref 3.5–5.0)
Alkaline Phosphatase: 67 U/L (ref 38–126)
Anion gap: 4 — ABNORMAL LOW (ref 5–15)
BUN: 8 mg/dL (ref 6–20)
CO2: 27 mmol/L (ref 22–32)
Calcium: 8.4 mg/dL — ABNORMAL LOW (ref 8.9–10.3)
Chloride: 106 mmol/L (ref 101–111)
Creatinine, Ser: 0.4 mg/dL — ABNORMAL LOW (ref 0.44–1.00)
GFR calc Af Amer: >60 mL/min (ref >60)
GFR calc non Af Amer: >60 mL/min (ref >60)
Glucose, Bld: 136 mg/dL — ABNORMAL HIGH (ref 65–99)
Potassium: 4.4 mmol/L (ref 3.5–5.1)
Sodium: 137 mmol/L (ref 135–145)
Total Bilirubin: 1.3 mg/dL — ABNORMAL HIGH (ref 0.3–1.2)
Total Protein: 4.9 g/dL — ABNORMAL LOW (ref 6.5–8.1)

## 2015-03-06 MED ORDER — CHLORHEXIDINE GLUCONATE 0.12 % MT SOLN
15.0000 mL | Freq: Two times a day (BID) | OROMUCOSAL | Status: DC
  Administered 2015-03-06 – 2015-03-10 (×9): 15 mL via OROMUCOSAL
  Filled 2015-03-06 (×7): qty 15

## 2015-03-06 MED ORDER — BOOST / RESOURCE BREEZE PO LIQD
1.0000 | Freq: Three times a day (TID) | ORAL | Status: DC
  Administered 2015-03-06 – 2015-03-12 (×10): 1 via ORAL

## 2015-03-06 MED ORDER — CETYLPYRIDINIUM CHLORIDE 0.05 % MT LIQD
7.0000 mL | Freq: Two times a day (BID) | OROMUCOSAL | Status: DC
  Administered 2015-03-07 – 2015-03-10 (×7): 7 mL via OROMUCOSAL

## 2015-03-06 NOTE — Progress Notes (Signed)
Patient ID: Tina Bailey, female   DOB: May 07, 1945, 69 y.o.   MRN: [id number] 2 Days Post-Op   Subjective: Off neo.  Had some pain issues when she woke up.  No n/v.  Tolerated sips of clears.  No flatus yet.    Objective: Vital signs in last 24 hours: Temp:  [98 F (36.7 C)-98.2 F (36.8 C)] 98.2 F (36.8 C) (12/16 0400) Pulse Rate:  [65-107] 99 (12/16 0600) Resp:  [8-29] 19 (12/16 0600) BP: (81-184)/(34-95) 156/68 mmHg (12/16 0600) SpO2:  [95 %-100 %] 99 % (12/16 0600) Arterial Line BP: (51-135)/(47-109) 135/91 mmHg (12/16 0600) Weight:  [53.5 kg (117 lb 15.1 oz)] 53.5 kg (117 lb 15.1 oz) (12/16 0400) Last BM Date:  (PTA)  Intake/Output from previous day: 12/15 0701 - 12/16 0700 In: 2771.3 [P.O.:120; I.V.:1353.3; IV Piggyback:1160] Out: 2630 [Urine:2045; Drains:585] Intake/Output this shift:    General appearance: alert, appears older than stated age, mild distress and anxious Resp: breathing comfortably Cardio: regular rate and rhythm GI: soft, approp tender, dressing wtih minimal staining.   JP serosanguinous.    Lab Results:   Recent Labs  03/05/15 0500 03/06/15 0510  WBC 13.3* 20.7*  HGB 9.4* 7.8*  HCT 28.2* 23.5*  PLT 212 142*   BMET  Recent Labs  03/05/15 0500 03/06/15 0510  NA 132* 137  K 4.7 4.4  CL 104 106  CO2 24 27  GLUCOSE 206* 136*  BUN 10 8  CREATININE 0.41* 0.40*  CALCIUM 8.0* 8.4*   PT/INR  Recent Labs  03/05/15 0500  LABPROT 18.1*  INR 1.49   ABG  Recent Labs  03/04/15 1140  PHART 7.285*  HCO3 22.5    Studies/Results: No results found.  Anti-infectives: Anti-infectives    Start     Dose/Rate Route Frequency Ordered Stop   03/04/15 1800  ciprofloxacin (CIPRO) IVPB 400 mg     400 mg 200 mL/hr over 60 Minutes Intravenous Every 12 hours 03/04/15 1602 03/04/15 1900   03/04/15 0722  ceFAZolin (ANCEF) IVPB 2 g/50 mL premix     2 g 100 mL/hr over 30 Minutes Intravenous On call to O.R. 03/04/15 [id number] 03/04/15 0921       Assessment/Plan: s/p Procedure(s): DIAGNOSTIC LAPAROSCOPY, EXTENDED RIGHT HEPATECTOMY, WITH INTRAOPERATIVE ULTRASOUND (N/A) PAS Continue foley due to patient critically ill and urinary output monitoring.  D/c in AM.   Continue 48 hours of albumin.   Decrease IVF.   Clears. Nicotine patch for tobacco abuse. Recheck magnesium for hypomagnesemia. Albumin for hypotension, hypovolemia, and hypoalbuminemia. ABL anemia/dilutional anemia - hold on lovenox.  Would not plan to transfuse today.  Recheck tomorrow.   Hypophosphatemia - recheck COPD - pumonary toilet.  Nebs/inhalers.   LOS: 2 days    [facility] 03/06/2015

## 2015-03-06 NOTE — Progress Notes (Signed)
Epidural D/C'd.  Called to evaluate the patient's epidural catheter due to an apparent leaking of fluid coming from the insertion site.  Upon arrival, the patient said she had made a sudden jerking motion earlier when she threw up.  She soon began to notice the wetness coming from her back.  When inspected, the epidural dressing was soaked and the catheter was at a depth of only 2 cm.  The dressing and catheter were removed completely as there is no way to salvage it at this point.  The catheter was fully intact.  She continues to have her PCA for pain control.  Nurse at bedside.  Albertha Ghee, MD

## 2015-03-06 NOTE — Progress Notes (Signed)
Initial Nutrition Assessment  DOCUMENTATION CODES:   Non-severe (moderate) malnutrition in context of chronic illness, Underweight  INTERVENTION:  - Will order Boost Breeze TID, each supplement provides 250 kcal and 9 grams of protein - Diet advancement as medically feasible - RD will continue to monitor for needs  NUTRITION DIAGNOSIS:   Inadequate protein intake related to other (see comment) (current diet order) as evidenced by other (see comment) (CLD does not meet estimated protein needs.).  GOAL:   Patient will meet greater than or equal to 90% of their needs  MONITOR:   PO intake, Supplement acceptance, Weight trends, Labs, Skin, I & O's  REASON FOR ASSESSMENT:   Other (Comment) (Underweight BMI)  ASSESSMENT:   69 year old female who presents with hepatic cancer. Patient is a 69 year old female who presented with abdominal pain. She is referred by Dr. Burr Medico. She has had this pain for 4 years and ultrasound showed gallstones. She had continued to have an increase in pain in the last 8-9 months, decreased appetite, fatigue, and weight loss. She went to see vascular surgery regarding a blue toe. CT angiogram demonstrated a 6.8 liver mass and a pancreatic lesion. MRI was performed which demonstrated that the pancreatic mass was likely a complex cyst. The liver mass was in 5 and 4B with capsular retraction. She had a mildly enlarged portal caval node. There were no findings indicative of severe cirrhosis or portal hypertension.   Pt seen for underweight BMI. Pt's diet advanced from NPO to CLD this AM and she was consuming jello and chicken broth at time of RD visit. Pt states she has been experiencing nausea since surgery but is not experiencing nausea at this time.   Pt had surgery 03/04/15: Diagnostic laparoscopy, liver biopsies, intraoperative liver ultrasound, extended right hepatic lobectomy, portal lymph node dissection, placement of biliary stent.  She states fair appetite  PTA and that this has been ongoing for at least several months, but unable to give exact time frame. She denies chewing or swallowing problems or taste alteration PTA. Pt reports about a year ago she weighed 120 lbs and prior to this her UBW was 135-140 lbs. Current weight of 117 lbs. Per chart review, weight has been stable since 12/04/14. Moderate muscle wasting to upper body noted, no fat wasting, no edema.   Unable to meet needs with current diet order. Will order Boost Breeze and switch to Ensure [place] with continued diet advancement. Medications reviewed. Labs reviewed; creatinine low, Ca: 8.4 mg/dL, Phos: 2.1 mg/dL, Mg: 1.5 mg/dL.   Diet Order:  Diet clear liquid Room service appropriate?: Yes; Fluid consistency:: Thin  Skin:  Wound (see comment) (Abdominal incision)  Last BM:  PTA  Height:   Ht Readings from Last 1 Encounters:  03/04/15 5\' 6"  (1.676 m)    Weight:   Wt Readings from Last 1 Encounters:  03/06/15 117 lb 15.1 oz (53.5 kg)    Ideal Body Weight:  59.09 kg (kg)  BMI:  Body mass index is 19.05 kg/(m^2).  Estimated Nutritional Needs:   Kcal:  [id number]  Protein:  65-80 grams  Fluid:  1.9-2.1 L/day  EDUCATION NEEDS:   No education needs identified at this time     Jarome Matin, RD, LDN Inpatient Clinical Dietitian Pager # [phone] After hours/weekend pager # [phone]

## 2015-03-06 NOTE — Telephone Encounter (Signed)
Tina Bailey is aware that this has been completed.

## 2015-03-07 ENCOUNTER — Inpatient Hospital Stay (HOSPITAL_COMMUNITY): Payer: Medicare Other

## 2015-03-07 LAB — TROPONIN I
Troponin I: <0.03 ng/mL (ref <0.031)
Troponin I: <0.03 ng/mL (ref <0.031)
Troponin I: <0.03 ng/mL (ref <0.031)

## 2015-03-07 LAB — COMPREHENSIVE METABOLIC PANEL
ALT: 205 U/L — ABNORMAL HIGH (ref 14–54)
AST: 95 U/L — ABNORMAL HIGH (ref 15–41)
Albumin: 4.5 g/dL (ref 3.5–5.0)
Alkaline Phosphatase: 54 U/L (ref 38–126)
Anion gap: 4 — ABNORMAL LOW (ref 5–15)
BUN: 6 mg/dL (ref 6–20)
CO2: 30 mmol/L (ref 22–32)
Calcium: 8.7 mg/dL — ABNORMAL LOW (ref 8.9–10.3)
Chloride: 101 mmol/L (ref 101–111)
Creatinine, Ser: 0.3 mg/dL — ABNORMAL LOW (ref 0.44–1.00)
GFR calc Af Amer: >60 mL/min (ref >60)
GFR calc non Af Amer: >60 mL/min (ref >60)
Glucose, Bld: 114 mg/dL — ABNORMAL HIGH (ref 65–99)
Potassium: 4 mmol/L (ref 3.5–5.1)
Sodium: 135 mmol/L (ref 135–145)
Total Bilirubin: 1.2 mg/dL (ref 0.3–1.2)
Total Protein: 5.4 g/dL — ABNORMAL LOW (ref 6.5–8.1)

## 2015-03-07 LAB — CBC
HCT: 21.8 % — ABNORMAL LOW (ref 36.0–46.0)
Hemoglobin: 7.2 g/dL — ABNORMAL LOW (ref 12.0–15.0)
MCH: 30.3 pg (ref 26.0–34.0)
MCHC: 33 g/dL (ref 30.0–36.0)
MCV: 91.6 fL (ref 78.0–100.0)
Platelets: 132 10*3/uL — ABNORMAL LOW (ref 150–400)
RBC: 2.38 MIL/uL — ABNORMAL LOW (ref 3.87–5.11)
RDW: 14.2 % (ref 11.5–15.5)
WBC: 14.1 10*3/uL — ABNORMAL HIGH (ref 4.0–10.5)

## 2015-03-07 LAB — MAGNESIUM: Magnesium: 2 mg/dL (ref 1.7–2.4)

## 2015-03-07 LAB — PHOSPHORUS: Phosphorus: 1.5 mg/dL — ABNORMAL LOW (ref 2.5–4.6)

## 2015-03-07 MED ORDER — ALBUTEROL SULFATE (2.5 MG/3ML) 0.083% IN NEBU: 3.0000 mL | INHALATION_SOLUTION | RESPIRATORY_TRACT | Status: DC | PRN

## 2015-03-07 MED ORDER — POTASSIUM PHOSPHATES 15 MMOLE/5ML IV SOLN
10.0000 mmol | Freq: Once | INTRAVENOUS | Status: AC
  Administered 2015-03-07: 10 mmol via INTRAVENOUS
  Filled 2015-03-07: qty 3.33

## 2015-03-07 MED ORDER — ALBUMIN HUMAN 25 % IV SOLN
INTRAVENOUS | Status: AC
  Filled 2015-03-07: qty 50

## 2015-03-07 MED ORDER — ALBUTEROL SULFATE (2.5 MG/3ML) 0.083% IN NEBU
2.5000 mg | INHALATION_SOLUTION | RESPIRATORY_TRACT | Status: DC | PRN
  Administered 2015-03-08: 2.5 mg via RESPIRATORY_TRACT
  Filled 2015-03-07: qty 3

## 2015-03-07 MED ORDER — FUROSEMIDE 10 MG/ML IJ SOLN
20.0000 mg | Freq: Once | INTRAMUSCULAR | Status: AC
Start: 2015-03-07 — End: 2015-03-07
  Administered 2015-03-07: 20 mg via INTRAVENOUS
  Filled 2015-03-07: qty 2

## 2015-03-07 NOTE — Progress Notes (Signed)
Pt c/o of being sob.  Oxygen saturation remains 95-98% and other vital signs WNL.  Informed Dr. Marlou Starks.  Chest xray ordered.  Irven Baltimore, RN

## 2015-03-07 NOTE — Progress Notes (Signed)
Pt c/o sob. Informed Dr. Marlou Starks 20mg  IV lasix ordered.  Irven Baltimore, RN

## 2015-03-07 NOTE — Progress Notes (Signed)
3 Days Post-Op  Subjective: Complains of chest tightness and shortness of breath  Objective: Vital signs in last 24 hours: Temp:  [97.6 F (36.4 C)-98.3 F (36.8 C)] 98.3 F (36.8 C) (12/17 0400) Pulse Rate:  [77-109] 87 (12/17 0700) Resp:  [8-33] 10 (12/17 0700) BP: (123-158)/(44-117) 139/52 mmHg (12/17 0700) SpO2:  [93 %-99 %] 95 % (12/17 0700) FiO2 (%):  [95 %] 95 % (12/17 0000) Last BM Date:  (PTA)  Intake/Output from previous day: 12/16 0701 - 12/17 0700 In: 1766.7 [I.V.:1246.7; IV Piggyback:400] Out: [id number] [Urine:1195; Drains:500] Intake/Output this shift:    Resp: wheezes anterior - right Cardio: regular rate and rhythm GI: soft, minimal tenderness. drain output serosang  Lab Results:   Recent Labs  03/06/15 0510 03/07/15 0352  WBC 20.7* 14.1*  HGB 7.8* 7.2*  HCT 23.5* 21.8*  PLT 142* 132*   BMET  Recent Labs  03/06/15 0510 03/07/15 0352  NA 137 135  K 4.4 4.0  CL 106 101  CO2 27 30  GLUCOSE 136* 114*  BUN 8 6  CREATININE 0.40* 0.30*  CALCIUM 8.4* 8.7*   PT/INR  Recent Labs  03/05/15 0500  LABPROT 18.1*  INR 1.49   ABG  Recent Labs  03/04/15 1140  PHART 7.285*  HCO3 22.5    Studies/Results: No results found.  Anti-infectives: Anti-infectives    Start     Dose/Rate Route Frequency Ordered Stop   03/04/15 1800  ciprofloxacin (CIPRO) IVPB 400 mg     400 mg 200 mL/hr over 60 Minutes Intravenous Every 12 hours 03/04/15 1602 03/04/15 1900   03/04/15 0722  ceFAZolin (ANCEF) IVPB 2 g/50 mL premix     2 g 100 mL/hr over 30 Minutes Intravenous On call to O.R. 03/04/15 [id number] 03/04/15 0921      Assessment/Plan: s/p Procedure(s): DIAGNOSTIC LAPAROSCOPY, EXTENDED RIGHT HEPATECTOMY, WITH INTRAOPERATIVE ULTRASOUND (N/A) will get EKG, CXR, troponin for chest pain  Start albuterol for wheezing Continue to monitor in icu  LOS: 3 days    TOTH III,Linnie Mcglocklin S 03/07/2015

## 2015-03-07 NOTE — Progress Notes (Signed)
Pt was lying in bed and awake when I arrived. Her daughter and son-in-law were bedside and excused themselves during our visit. Pt was very tearful throughout our visit. She pointed to where she had surgery and said she wasn't sure about it but put it all in God's hands. She said she is a [place] and trusted God. She said, as she looked down, look at all this I have in me and it's not going to change the cancer. She apologized, as she described her sorrow and grief for the condition of her health. She admitted to smoking (seeming to blame that for her health). She said she just needed to talk. She could not clearly explain her tears, but they are part of everything that has been going on. Parts of pt's story were difficult to understand apart from her tears and breathing. Please page if additional support is needed. Prior to the end of our visit, Ch provided prayer as requested by pt. Chaplain Marlise Eves Holder   03/07/15 1600  Clinical Encounter Type  Visited With Patient and family together

## 2015-03-08 LAB — PHOSPHORUS: Phosphorus: 1.9 mg/dL — ABNORMAL LOW (ref 2.5–4.6)

## 2015-03-08 LAB — COMPREHENSIVE METABOLIC PANEL
ALT: 131 U/L — ABNORMAL HIGH (ref 14–54)
AST: 40 U/L (ref 15–41)
Albumin: 4.4 g/dL (ref 3.5–5.0)
Alkaline Phosphatase: 52 U/L (ref 38–126)
Anion gap: 6 (ref 5–15)
BUN: 8 mg/dL (ref 6–20)
CO2: 31 mmol/L (ref 22–32)
Calcium: 8.7 mg/dL — ABNORMAL LOW (ref 8.9–10.3)
Chloride: 95 mmol/L — ABNORMAL LOW (ref 101–111)
Creatinine, Ser: 0.32 mg/dL — ABNORMAL LOW (ref 0.44–1.00)
GFR calc Af Amer: >60 mL/min (ref >60)
GFR calc non Af Amer: >60 mL/min (ref >60)
Glucose, Bld: 116 mg/dL — ABNORMAL HIGH (ref 65–99)
Potassium: 4 mmol/L (ref 3.5–5.1)
Sodium: 132 mmol/L — ABNORMAL LOW (ref 135–145)
Total Bilirubin: 1.2 mg/dL (ref 0.3–1.2)
Total Protein: 5.4 g/dL — ABNORMAL LOW (ref 6.5–8.1)

## 2015-03-08 LAB — TYPE AND SCREEN
ABO/RH(D): A POS
Antibody Screen: NEGATIVE
Unit division: 0
Unit division: 0
Unit division: 0
Unit division: 0

## 2015-03-08 LAB — CBC
HCT: 24.7 % — ABNORMAL LOW (ref 36.0–46.0)
Hemoglobin: 8.1 g/dL — ABNORMAL LOW (ref 12.0–15.0)
MCH: 30.1 pg (ref 26.0–34.0)
MCHC: 32.8 g/dL (ref 30.0–36.0)
MCV: 91.8 fL (ref 78.0–100.0)
Platelets: 160 10*3/uL (ref 150–400)
RBC: 2.69 MIL/uL — ABNORMAL LOW (ref 3.87–5.11)
RDW: 14.1 % (ref 11.5–15.5)
WBC: 14.6 10*3/uL — ABNORMAL HIGH (ref 4.0–10.5)

## 2015-03-08 MED ORDER — SODIUM PHOSPHATE 3 MMOLE/ML IV SOLN
15.0000 mmol | Freq: Once | INTRAVENOUS | Status: AC
  Administered 2015-03-08: 15 mmol via INTRAVENOUS
  Filled 2015-03-08: qty 5

## 2015-03-08 NOTE — Progress Notes (Signed)
   03/08/15 1400  PT Visit Information  Last PT Received On 03/08/15  Reason Eval/Treat Not Completed Fatigue/lethargy limiting ability to participate

## 2015-03-08 NOTE — Progress Notes (Signed)
4 Days Post-Op  Subjective: Feels a little better today. Less chest tightness  Objective: Vital signs in last 24 hours: Temp:  [97.4 F (36.3 C)-98.6 F (37 C)] 98.4 F (36.9 C) (12/18 0800) Pulse Rate:  [74-94] 76 (12/18 0300) Resp:  [7-26] 20 (12/18 0400) BP: (116-170)/(42-123) 124/46 mmHg (12/18 0300) SpO2:  [95 %-99 %] 98 % (12/18 0400) FiO2 (%):  [97 %-98 %] 97 % (12/18 0400) Last BM Date: 03/04/15  Intake/Output from previous day: 12/17 0701 - 12/18 0700 In: 1503.3 [I.V.:1150; IV Piggyback:353.3] Out: 2070 [Urine:1600; Drains:470] Intake/Output this shift:    Resp: clear to auscultation bilaterally Cardio: regular rate and rhythm GI: soft, appropriately tender. drain bile tinged  Lab Results:   Recent Labs  03/07/15 0352 03/08/15 0400  WBC 14.1* 14.6*  HGB 7.2* 8.1*  HCT 21.8* 24.7*  PLT 132* 160   BMET  Recent Labs  03/07/15 0352 03/08/15 0400  NA 135 132*  K 4.0 4.0  CL 101 95*  CO2 30 31  GLUCOSE 114* 116*  BUN 6 8  CREATININE 0.30* 0.32*  CALCIUM 8.7* 8.7*   PT/INR No results for input(s): LABPROT, INR in the last 72 hours. ABG No results for input(s): PHART, HCO3 in the last 72 hours.  Invalid input(s): PCO2, PO2  Studies/Results: Dg Chest Port 1 View  03/07/2015  CLINICAL DATA:  Shortness of breath and right-sided chest pain EXAM: PORTABLE CHEST - 1 VIEW COMPARISON:  None. FINDINGS: Cardiac shadow is within normal limits. Tortuosity of the thoracic aorta is seen. Small right-sided pleural effusion is noted. Postoperative changes in the right upper quadrant are noted. IMPRESSION: Small right-sided effusion. This is likely related to the recent surgery. No other focal abnormality is noted. Electronically Signed   By: Inez Catalina M.D.   On: 03/07/2015 08:48    Anti-infectives: Anti-infectives    Start     Dose/Rate Route Frequency Ordered Stop   03/04/15 1800  ciprofloxacin (CIPRO) IVPB 400 mg     400 mg 200 mL/hr over 60 Minutes  Intravenous Every 12 hours 03/04/15 1602 03/04/15 1900   03/04/15 0722  ceFAZolin (ANCEF) IVPB 2 g/50 mL premix     2 g 100 mL/hr over 30 Minutes Intravenous On call to O.R. 03/04/15 [id number] 03/04/15 0921      Assessment/Plan: s/p Procedure(s): DIAGNOSTIC LAPAROSCOPY, EXTENDED RIGHT HEPATECTOMY, WITH INTRAOPERATIVE ULTRASOUND (N/A) Advance diet. Stay with clears today  OOB  D/c foley later today Continue drain  LOS: 4 days    TOTH III,Tina Bailey S 03/08/2015

## 2015-03-09 ENCOUNTER — Institutional Professional Consult (permissible substitution): Payer: Medicare Other | Admitting: Internal Medicine

## 2015-03-09 LAB — BASIC METABOLIC PANEL
Anion gap: 7 (ref 5–15)
Anion gap: 5 (ref 5–15)
BUN: 7 mg/dL (ref 6–20)
BUN: 8 mg/dL (ref 6–20)
CO2: 29 mmol/L (ref 22–32)
CO2: 28 mmol/L (ref 22–32)
Calcium: 8.6 mg/dL — ABNORMAL LOW (ref 8.9–10.3)
Calcium: 8.2 mg/dL — ABNORMAL LOW (ref 8.9–10.3)
Chloride: 89 mmol/L — ABNORMAL LOW (ref 101–111)
Chloride: 93 mmol/L — ABNORMAL LOW (ref 101–111)
Creatinine, Ser: <0.3 mg/dL — ABNORMAL LOW (ref 0.44–1.00)
Creatinine, Ser: <0.3 mg/dL — ABNORMAL LOW (ref 0.44–1.00)
Glucose, Bld: 124 mg/dL — ABNORMAL HIGH (ref 65–99)
Glucose, Bld: 116 mg/dL — ABNORMAL HIGH (ref 65–99)
Potassium: 4.3 mmol/L (ref 3.5–5.1)
Potassium: 3.9 mmol/L (ref 3.5–5.1)
Sodium: 125 mmol/L — ABNORMAL LOW (ref 135–145)
Sodium: 126 mmol/L — ABNORMAL LOW (ref 135–145)

## 2015-03-09 LAB — COMPREHENSIVE METABOLIC PANEL
ALT: 91 U/L — ABNORMAL HIGH (ref 14–54)
AST: 24 U/L (ref 15–41)
Albumin: 3.7 g/dL (ref 3.5–5.0)
Alkaline Phosphatase: 53 U/L (ref 38–126)
Anion gap: 6 (ref 5–15)
BUN: 7 mg/dL (ref 6–20)
CO2: 29 mmol/L (ref 22–32)
Calcium: 8.3 mg/dL — ABNORMAL LOW (ref 8.9–10.3)
Chloride: 91 mmol/L — ABNORMAL LOW (ref 101–111)
Creatinine, Ser: <0.3 mg/dL — ABNORMAL LOW (ref 0.44–1.00)
Glucose, Bld: 129 mg/dL — ABNORMAL HIGH (ref 65–99)
Potassium: 4.1 mmol/L (ref 3.5–5.1)
Sodium: 126 mmol/L — ABNORMAL LOW (ref 135–145)
Total Bilirubin: 1.3 mg/dL — ABNORMAL HIGH (ref 0.3–1.2)
Total Protein: 5.2 g/dL — ABNORMAL LOW (ref 6.5–8.1)

## 2015-03-09 LAB — PHOSPHORUS
Phosphorus: 1.9 mg/dL — ABNORMAL LOW (ref 2.5–4.6)
Phosphorus: 2.3 mg/dL — ABNORMAL LOW (ref 2.5–4.6)

## 2015-03-09 LAB — CBC
HCT: 26.7 % — ABNORMAL LOW (ref 36.0–46.0)
Hemoglobin: 8.9 g/dL — ABNORMAL LOW (ref 12.0–15.0)
MCH: 30.1 pg (ref 26.0–34.0)
MCHC: 33.3 g/dL (ref 30.0–36.0)
MCV: 90.2 fL (ref 78.0–100.0)
Platelets: 198 10*3/uL (ref 150–400)
RBC: 2.96 MIL/uL — ABNORMAL LOW (ref 3.87–5.11)
RDW: 13.6 % (ref 11.5–15.5)
WBC: 13 10*3/uL — ABNORMAL HIGH (ref 4.0–10.5)

## 2015-03-09 LAB — MAGNESIUM: Magnesium: 1.6 mg/dL — ABNORMAL LOW (ref 1.7–2.4)

## 2015-03-09 MED ORDER — SODIUM PHOSPHATE 3 MMOLE/ML IV SOLN
10.0000 mmol | Freq: Once | INTRAVENOUS | Status: AC
  Administered 2015-03-09: 10 mmol via INTRAVENOUS
  Filled 2015-03-09: qty 3.33

## 2015-03-09 MED ORDER — DIPHENHYDRAMINE HCL 12.5 MG/5ML PO ELIX: 12.5000 mg | ORAL_SOLUTION | Freq: Four times a day (QID) | ORAL | Status: DC | PRN

## 2015-03-09 MED ORDER — SODIUM CHLORIDE 0.9 % IJ SOLN: 9.0000 mL | INTRAMUSCULAR | Status: DC | PRN

## 2015-03-09 MED ORDER — ONDANSETRON HCL 4 MG/2ML IJ SOLN: 4.0000 mg | Freq: Four times a day (QID) | INTRAMUSCULAR | Status: DC | PRN

## 2015-03-09 MED ORDER — MAGNESIUM SULFATE 4 GM/100ML IV SOLN
4.0000 g | Freq: Once | INTRAVENOUS | Status: AC
  Administered 2015-03-09: 4 g via INTRAVENOUS
  Filled 2015-03-09: qty 100

## 2015-03-09 MED ORDER — FENTANYL 40 MCG/ML IV SOLN
INTRAVENOUS | Status: DC
  Administered 2015-03-09: 10 ug via INTRAVENOUS
  Administered 2015-03-09: 20 ug via INTRAVENOUS
  Administered 2015-03-10 (×2): 5 ug via INTRAVENOUS
  Administered 2015-03-10: 15 ug via INTRAVENOUS
  Administered 2015-03-10: 10 ug via INTRAVENOUS
  Administered 2015-03-11: 5 ug via INTRAVENOUS
  Administered 2015-03-11: 10 ug via INTRAVENOUS

## 2015-03-09 MED ORDER — SODIUM CHLORIDE 0.9 % IV SOLN
INTRAVENOUS | Status: DC
  Administered 2015-03-09 – 2015-03-12 (×3): via INTRAVENOUS

## 2015-03-09 MED ORDER — FENTANYL 40 MCG/ML IV SOLN: INTRAVENOUS | Status: DC

## 2015-03-09 MED ORDER — DIPHENHYDRAMINE HCL 50 MG/ML IJ SOLN: 12.5000 mg | Freq: Four times a day (QID) | INTRAMUSCULAR | Status: DC | PRN

## 2015-03-09 MED ORDER — SODIUM CHLORIDE 1 G PO TABS
1.0000 g | ORAL_TABLET | Freq: Two times a day (BID) | ORAL | Status: DC
  Administered 2015-03-09 – 2015-03-13 (×8): 1 g via ORAL
  Filled 2015-03-09 (×11): qty 1

## 2015-03-09 MED ORDER — NALOXONE HCL 0.4 MG/ML IJ SOLN: 0.4000 mg | INTRAMUSCULAR | Status: DC | PRN

## 2015-03-09 NOTE — Clinical Documentation Improvement (Addendum)
General Surgery  Based on the clinical findings below, please document any associated diagnoses/conditions the patient has or may have.   Non-severe (moderate) malnutrition in context of chronic illness  Other severity of malnutrition (please specify)  Other condition  Clinically Undetermined  Supporting Information: Registered Dietician documented 03/06/2015 "non-severe (moderate) malnutrition in context of chronic illness, underweight".  Also documented height 5 feet 6 inches, weight 117 pounds 15.1 ounces, BMI 19.05.  Please document in your progress note and discharge summary if you agree with the assessment by the Registered Dietician.     Please exercise your independent, professional judgment when responding. A specific answer is not anticipated or expected. Please update your documentation within the medical record to reflect your response to this query.  Thank you, Mateo Flow, RN [phone] Clinical Documentation Specialist

## 2015-03-09 NOTE — Evaluation (Signed)
Physical Therapy Evaluation Patient Details Name: Tina Bailey MRN: [id number] DOB: October 21, 1945 Today's Date: 03/09/2015   History of Present Illness  69 yo female s/p liver biopsy, R hepatic lobectomy, biliary stent, lymph node dissection 03/04/15.   Clinical Impression  On eval, pt required Min guard assist +2 for safety/equipment-walked ~50'x1, 25'x1 with RW. O2 sats >90% on 2L O2 but HR up to 148 bpm during ambulation. Discussed d/c plan-pt lives with family but they are not available 24/7. Will continue to follow and progress activity as able. May need to consider ST rehab at SNF, depending on progress.     Follow Up Recommendations Home health PT;Supervision/Assistance - 24 hour (if family can provide assist. Tina Bailey could be beneficial if goes home. Otherwise, will need to consider ST rehab at SNF if pt does not progress)    Equipment Recommendations   (to be determined)    Recommendations for Other Services       Precautions / Restrictions Precautions Precautions: Fall Precaution Comments: monitor vitals Restrictions Weight Bearing Restrictions: No      Mobility  Bed Mobility               General bed mobility comments: pt sitting EOB  Transfers Overall transfer level: Needs assistance Equipment used: Rolling walker (2 wheeled) Transfers: Sit to/from Stand Sit to Stand: Min guard         General transfer comment: close guard for safety. VCs hand placement  Ambulation/Gait Ambulation/Gait assistance: Min guard;+2 safety/equipment Ambulation Distance (Feet): 50 Feet (25'x1, 50'x1) Assistive device: Rolling walker (2 wheeled) Gait Pattern/deviations: Step-through pattern;Decreased stride length     General Gait Details: slow gait speed. O2 sats >90% on 2L O2, HR as high as 148 bpm. Seated rest break taken to allow for rest/recovery-HR down to 120s but jumps back up as soon as pt starts moving again. Pt denies lightheadedness or feeling that  heart is racing. Followed closely with recliner.   Stairs            Wheelchair Mobility    Modified Rankin (Stroke Patients Only)       Balance Overall balance assessment: Needs assistance         Standing balance support: During functional activity Standing balance-Leahy Scale: Fair                               Pertinent Vitals/Pain Pain Assessment: 0-10 Pain Score: 4  Pain Location: R side Pain Intervention(s): Limited activity within patient's tolerance;Monitored during session;Repositioned    Home Living Family/patient expects to be discharged to:: Private residence Living Arrangements: Other relatives;Children Available Help at Discharge: Family Type of Home: House Home Access: Stairs to enter Entrance Stairs-Rails: Right Entrance Stairs-Number of Steps: 3 Home Layout: One level Home Equipment: None      Prior Function Level of Independence: Independent               Hand Dominance        Extremity/Trunk Assessment   Upper Extremity Assessment: Overall WFL for tasks assessed           Lower Extremity Assessment: Generalized weakness      Cervical / Trunk Assessment: Normal  Communication   Communication: No difficulties  Cognition Arousal/Alertness: Awake/alert Behavior During Therapy: WFL for tasks assessed/performed Overall Cognitive Status: Within Functional Limits for tasks assessed  General Comments      Exercises        Assessment/Plan    PT Assessment Patient needs continued PT services  PT Diagnosis Difficulty walking;Generalized weakness;Acute pain   PT Problem List Decreased strength;Decreased activity tolerance;Decreased balance;Decreased mobility;Pain;Decreased knowledge of use of DME  PT Treatment Interventions DME instruction;Gait training;Functional mobility training;Therapeutic activities;Patient/family education;Therapeutic exercise;Balance training   PT Goals  (Current goals can be found in the Care Plan section) Acute Rehab PT Goals Patient Stated Goal: home. regain PLOF PT Goal Formulation: With patient Time For Goal Achievement: 03/23/15 Potential to Achieve Goals: Good    Frequency Min 3X/week   Barriers to discharge        Co-evaluation               End of Session Equipment Utilized During Treatment: Oxygen Activity Tolerance: Patient tolerated treatment well (but HR up to 140s with activity) Patient left: in chair;with call bell/phone within reach           Time: [id number] PT Time Calculation (min) (ACUTE ONLY): 20 min   Charges:   PT Evaluation $Initial PT Evaluation Tier I: 1 Procedure     PT G Codes:        Weston Anna, MPT Pager: [phone]

## 2015-03-09 NOTE — Progress Notes (Signed)
5 Days Post-Op  Subjective: Complains that PCA dose is too much so she is not using it often, having some incisional pain.  No flatus but no nausea  Objective: Vital signs in last 24 hours: Temp:  [97.5 F (36.4 C)-98.6 F (37 C)] 97.5 F (36.4 C) (12/19 0800) Pulse Rate:  [81-125] 100 (12/19 0700) Resp:  [11-31] 21 (12/19 0700) BP: (102-174)/(40-73) 157/58 mmHg (12/19 0700) SpO2:  [90 %-100 %] 98 % (12/19 0700) Last BM Date:  (PTA)  Intake/Output from previous day: 12/18 0701 - 12/19 0700 In: 1885 [P.O.:480; I.V.:1150; IV Piggyback:255] Out: 1565 [Urine:1280; Drains:285] Intake/Output this shift:    Resp: clear to auscultation bilaterally Cardio: regular rate and rhythm GI: soft, appropriately tender. Drain: bile   Lab Results:   Recent Labs  03/08/15 0400 03/09/15 0353  WBC 14.6* 13.0*  HGB 8.1* 8.9*  HCT 24.7* 26.7*  PLT 160 198   BMET  Recent Labs  03/08/15 0400 03/09/15 0353  NA 132* 126*  K 4.0 4.1  CL 95* 91*  CO2 31 29  GLUCOSE 116* 129*  BUN 8 7  CREATININE 0.32* <0.30*  CALCIUM 8.7* 8.3*   PT/INR No results for input(s): LABPROT, INR in the last 72 hours. ABG No results for input(s): PHART, HCO3 in the last 72 hours.  Invalid input(s): PCO2, PO2  Studies/Results: No results found.  Anti-infectives: Anti-infectives    Start     Dose/Rate Route Frequency Ordered Stop   03/04/15 1800  ciprofloxacin (CIPRO) IVPB 400 mg     400 mg 200 mL/hr over 60 Minutes Intravenous Every 12 hours 03/04/15 1602 03/04/15 1900   03/04/15 0722  ceFAZolin (ANCEF) IVPB 2 g/50 mL premix     2 g 100 mL/hr over 30 Minutes Intravenous On call to O.R. 03/04/15 [id number] 03/04/15 0921      Assessment/Plan: s/p Procedure(s): DIAGNOSTIC LAPAROSCOPY, EXTENDED RIGHT HEPATECTOMY, WITH INTRAOPERATIVE ULTRASOUND (N/A) Advance diet to fulls  OOB, PT today Hyponatremia and hypophosphatemia: will replace today and recheck labs this afternoon.  Check Mg level Continue  drain Will decrease PCA dose to see if this helps her pain control better.  Hopefully will be able to switch to PO meds soon.  LOS: 5 days    Kiven Vangilder C. [phone]

## 2015-03-09 NOTE — Care Management Note (Signed)
Case Management Note  Patient Details  Name: Tina Bailey MRN: [id number] Date of Birth: Mar 29, 1945  Subjective/Objective:                 Extensive abd surg remains hypotensive   Action/Plan:Date: March 09, 2015 Chart reviewed for concurrent status and case management needs. Will continue to follow patient for changes and needs: Velva Harman, RN, BSN, [place]   [phone]   Expected Discharge Date:                  Expected Discharge Plan:  Home/Self Care  In-House Referral:  NA  Discharge planning Services  CM Consult  Post Acute Care Choice:  NA Choice offered to:  NA  DME Arranged:    DME Agency:     HH Arranged:    HH Agency:     Status of Service:  In process, will continue to follow  Medicare Important Message Given:    Date Medicare IM Given:    Medicare IM give by:    Date Additional Medicare IM Given:    Additional Medicare Important Message give by:     If discussed at [place] of Stay Meetings, dates discussed:    Additional Comments:  Leeroy Cha, RN 03/09/2015, 10:32 AM

## 2015-03-09 NOTE — Progress Notes (Signed)
Pt's daughter was bedside when I arrived. She said she was feeling better and for the first time in a while had a good night's rest. They appreciated visit. Offered additional support as needed.  Chaplain Marlise Eves Holder   03/08/15 [id number]  Clinical Encounter Type  Visited With Patient and family together

## 2015-03-10 LAB — CBC
HCT: 27.6 % — ABNORMAL LOW (ref 36.0–46.0)
Hemoglobin: 9.3 g/dL — ABNORMAL LOW (ref 12.0–15.0)
MCH: 29.9 pg (ref 26.0–34.0)
MCHC: 33.7 g/dL (ref 30.0–36.0)
MCV: 88.7 fL (ref 78.0–100.0)
Platelets: 238 10*3/uL (ref 150–400)
RBC: 3.11 MIL/uL — ABNORMAL LOW (ref 3.87–5.11)
RDW: 14.1 % (ref 11.5–15.5)
WBC: 11.4 10*3/uL — ABNORMAL HIGH (ref 4.0–10.5)

## 2015-03-10 LAB — BASIC METABOLIC PANEL
Anion gap: 6 (ref 5–15)
BUN: 11 mg/dL (ref 6–20)
CO2: 29 mmol/L (ref 22–32)
Calcium: 8.2 mg/dL — ABNORMAL LOW (ref 8.9–10.3)
Chloride: 94 mmol/L — ABNORMAL LOW (ref 101–111)
Creatinine, Ser: 0.36 mg/dL — ABNORMAL LOW (ref 0.44–1.00)
GFR calc Af Amer: >60 mL/min (ref >60)
GFR calc non Af Amer: >60 mL/min (ref >60)
Glucose, Bld: 91 mg/dL (ref 65–99)
Potassium: 4.3 mmol/L (ref 3.5–5.1)
Sodium: 129 mmol/L — ABNORMAL LOW (ref 135–145)

## 2015-03-10 MED ORDER — HEPARIN SODIUM (PORCINE) 5000 UNIT/ML IJ SOLN
5000.0000 [IU] | Freq: Three times a day (TID) | INTRAMUSCULAR | Status: DC
  Administered 2015-03-10 – 2015-03-13 (×9): 5000 [IU] via SUBCUTANEOUS
  Filled 2015-03-10 (×10): qty 1

## 2015-03-10 NOTE — Progress Notes (Signed)
6 Days Post-Op  Subjective: PCA dose better.  Sleeping well.  Passing flatus.  Objective: Vital signs in last 24 hours: Temp:  [98.1 F (36.7 Bailey)-99.1 F (37.3 Bailey)] 98.3 F (36.8 Bailey) (12/20 0744) Pulse Rate:  [81-126] 82 (12/20 0800) Resp:  [10-25] 15 (12/20 0800) BP: (115-187)/(47-75) 131/52 mmHg (12/20 0800) SpO2:  [90 %-100 %] 98 % (12/20 0800) Last BM Date:  (PTA)  Intake/Output from previous day: 12/19 0701 - 12/20 0700 In: 850 [I.V.:500; IV Piggyback:350] Out: [id number] [Urine:1050; Drains:295] Intake/Output this shift: Total I/O In: 10 [I.V.:10] Out: -   Resp: clear to auscultation bilaterally Cardio: regular rate and rhythm GI: soft, appropriately tender.  Incision: clean, dry Drain: bile   Lab Results:   Recent Labs  03/08/15 0400 03/09/15 0353  WBC 14.6* 13.0*  HGB 8.1* 8.9*  HCT 24.7* 26.7*  PLT 160 198   BMET  Recent Labs  03/09/15 1141 03/09/15 2109  NA 125* 126*  K 4.3 3.9  CL 89* 93*  CO2 29 28  GLUCOSE 124* 116*  BUN 7 8  CREATININE <0.30* <0.30*  CALCIUM 8.6* 8.2*   PT/INR No results for input(s): LABPROT, INR in the last 72 hours. ABG No results for input(s): PHART, HCO3 in the last 72 hours.  Invalid input(s): PCO2, PO2  Studies/Results: No results found.  Anti-infectives: Anti-infectives    Start     Dose/Rate Route Frequency Ordered Stop   03/04/15 1800  ciprofloxacin (CIPRO) IVPB 400 mg     400 mg 200 mL/hr over 60 Minutes Intravenous Every 12 hours 03/04/15 1602 03/04/15 1900   03/04/15 0722  ceFAZolin (ANCEF) IVPB 2 g/50 mL premix     2 g 100 mL/hr over 30 Minutes Intravenous On call to O.R. 03/04/15 [id number] 03/04/15 0921      Assessment/Plan: s/p Procedure(s): DIAGNOSTIC LAPAROSCOPY, EXTENDED RIGHT HEPATECTOMY, WITH INTRAOPERATIVE ULTRASOUND (N/A) Advance diet to low fat solid foods as tolerated OOB, PT today Hyponatremia: will recheck labs today.  Cont fluid restriction and salt tabs.  Suspect this is due to bile  leak Continue drain, change dressing Will cont PCA low dose for today.  Hopefully will be able to switch to PO meds soon.  LOS: 6 days    Tina Bailey. [id number]

## 2015-03-11 LAB — CBC
HCT: 25.6 % — ABNORMAL LOW (ref 36.0–46.0)
Hemoglobin: 8.8 g/dL — ABNORMAL LOW (ref 12.0–15.0)
MCH: 30.2 pg (ref 26.0–34.0)
MCHC: 34.4 g/dL (ref 30.0–36.0)
MCV: 88 fL (ref 78.0–100.0)
Platelets: 234 10*3/uL (ref 150–400)
RBC: 2.91 MIL/uL — ABNORMAL LOW (ref 3.87–5.11)
RDW: 14.5 % (ref 11.5–15.5)
WBC: 11.2 10*3/uL — ABNORMAL HIGH (ref 4.0–10.5)

## 2015-03-11 LAB — BASIC METABOLIC PANEL
Anion gap: 7 (ref 5–15)
BUN: 16 mg/dL (ref 6–20)
CO2: 30 mmol/L (ref 22–32)
Calcium: 8.2 mg/dL — ABNORMAL LOW (ref 8.9–10.3)
Chloride: 94 mmol/L — ABNORMAL LOW (ref 101–111)
Creatinine, Ser: 0.43 mg/dL — ABNORMAL LOW (ref 0.44–1.00)
GFR calc Af Amer: >60 mL/min (ref >60)
GFR calc non Af Amer: >60 mL/min (ref >60)
Glucose, Bld: 94 mg/dL (ref 65–99)
Potassium: 4.2 mmol/L (ref 3.5–5.1)
Sodium: 131 mmol/L — ABNORMAL LOW (ref 135–145)

## 2015-03-11 LAB — PHOSPHORUS: Phosphorus: 2.8 mg/dL (ref 2.5–4.6)

## 2015-03-11 LAB — MAGNESIUM: Magnesium: 1.9 mg/dL (ref 1.7–2.4)

## 2015-03-11 MED ORDER — HYDROCODONE-ACETAMINOPHEN 5-325 MG PO TABS
1.0000 | ORAL_TABLET | ORAL | Status: DC | PRN
  Administered 2015-03-11 – 2015-03-12 (×4): 1 via ORAL
  Filled 2015-03-11 (×4): qty 1

## 2015-03-11 MED ORDER — MORPHINE SULFATE (PF) 2 MG/ML IV SOLN: 1.0000 mg | INTRAVENOUS | Status: DC | PRN

## 2015-03-11 MED ORDER — SENNOSIDES-DOCUSATE SODIUM 8.6-50 MG PO TABS
1.0000 | ORAL_TABLET | Freq: Two times a day (BID) | ORAL | Status: DC
  Administered 2015-03-11 – 2015-03-12 (×3): 1 via ORAL
  Filled 2015-03-11 (×4): qty 1

## 2015-03-11 NOTE — Progress Notes (Signed)
Physical Therapy Treatment Patient Details Name: Tina Bailey MRN: [id number] DOB: [birth year] Today's Date: 03/11/2015    History of Present Illness 69 yo female s/p liver biopsy, R hepatic lobectomy, biliary stent, lymph node dissection 03/04/15.     PT Comments    Pt feeling "better".  Assisted OOB to amb a limited distance due to increased HR from 101 to 138 with activity.  RA maintained above 90.    Follow Up Recommendations  Home health PT;Supervision/Assistance - 24 hour     Equipment Recommendations  None recommended by PT    Recommendations for Other Services       Precautions / Restrictions Precautions Precautions: Fall Precaution Comments: monitor vitals/ABD incision Restrictions Weight Bearing Restrictions: No    Mobility  Bed Mobility Overal bed mobility: Modified Independent             General bed mobility comments: increased time  Transfers Overall transfer level: Modified independent Equipment used: Rolling walker (2 wheeled) Transfers: Sit to/from Stand Sit to Stand: Supervision;Min guard         General transfer comment: increased time but good safety cognition and use of hands  Ambulation/Gait Ambulation/Gait assistance: Supervision;Min guard Ambulation Distance (Feet): 45 Feet Assistive device: Rolling walker (2 wheeled) Gait Pattern/deviations: Step-through pattern Gait velocity: WFL   General Gait Details: RA stayed above 90 however HR was high.  Avg 138 with amb.  Limited amb distance due to HR.   Asymptomatic.  No c/o's.  assisted back to bed HR decreased to 121.     Stairs            Wheelchair Mobility    Modified Rankin (Stroke Patients Only)       Balance                                    Cognition Arousal/Alertness: Awake/alert Behavior During Therapy: WFL for tasks assessed/performed Overall Cognitive Status: Within Functional Limits for tasks assessed                       Exercises      General Comments        Pertinent Vitals/Pain Pain Assessment: Faces Faces Pain Scale: Hurts a little bit Pain Location: R side near drain Pain Descriptors / Indicators: Sore Pain Intervention(s): Monitored during session;Repositioned    Home Living                      Prior Function            PT Goals (current goals can now be found in the care plan section)      Frequency  Min 3X/week    PT Plan Current plan remains appropriate    Co-evaluation             End of Session Equipment Utilized During Treatment: Gait belt Activity Tolerance: Treatment limited secondary to medical complications (Comment) (tacky cardia ) Patient left: in bed;with call bell/phone within reach     Time: 1100-1113 PT Time Calculation (min) (ACUTE ONLY): 13 min  Charges:  $Gait Training: 8-22 mins                    G Codes:      Rica Koyanagi  PTA [facility]  Acute  Rehab Pager      [phone]

## 2015-03-11 NOTE — Progress Notes (Signed)
Fentanyl PCA discontinued per M.D. Order. Unable to access waste option in Pyxis. Waste in sink witnessed by secondary RN Othella Boyer.

## 2015-03-11 NOTE — Care Management Note (Signed)
Case Management Note  Patient Details  Name: Tina Bailey MRN: [id number] Date of Birth: [birth year]  Subjective/Objective:  PT-recc HHPT. Patient pleasantly declines HHPT, or any HHC-states she can manage w/family.                   Action/Plan:d/c plan home.   Expected Discharge Date:                  Expected Discharge Plan:  Home/Self Care  In-House Referral:  NA  Discharge planning Services  CM Consult  Post Acute Care Choice:  NA Choice offered to:  NA  DME Arranged:    DME Agency:     HH Arranged:  Patient Refused [place] Agency:     Status of Service:  In process, will continue to follow  Medicare Important Message Given:  Yes Date Medicare IM Given:    Medicare IM give by:    Date Additional Medicare IM Given:    Additional Medicare Important Message give by:     If discussed at [place] of Stay Meetings, dates discussed:    Additional Comments:  Dessa Phi, RN 03/11/2015, 1:03 PM

## 2015-03-11 NOTE — Progress Notes (Signed)
7 Days Post-Op  Subjective: Tolerating a diet.  Passing flatus. No BM's.  Pain controlled.  Ambulating some.  JP drainage coming down, Na level rising appropriately  Objective: Vital signs in last 24 hours: Temp:  [97.9 F (36.6 C)-98.2 F (36.8 C)] 98.1 F (36.7 C) (12/21 0525) Pulse Rate:  [80-109] 80 (12/21 0525) Resp:  [10-20] 18 (12/21 0525) BP: (94-136)/(41-65) 136/54 mmHg (12/21 0525) SpO2:  [92 %-100 %] 100 % (12/21 0525) Weight:  [54.6 kg (120 lb 5.9 oz)] 54.6 kg (120 lb 5.9 oz) (12/20 1610) Last BM Date:  (prior to surgery- per patient. )  Intake/Output from previous day: 12/20 0701 - 12/21 0700 In: 720 [P.O.:480; I.V.:240] Out: 1088 [Urine:900; Drains:188] Intake/Output this shift:    Resp: clear to auscultation bilaterally Cardio: regular rate and rhythm GI: soft, appropriately tender.  Incision: clean, dry Drain: bile   Lab Results:   Recent Labs  03/10/15 0855 03/11/15 0441  WBC 11.4* 11.2*  HGB 9.3* 8.8*  HCT 27.6* 25.6*  PLT 238 234   BMET  Recent Labs  03/10/15 0855 03/11/15 0441  NA 129* 131*  K 4.3 4.2  CL 94* 94*  CO2 29 30  GLUCOSE 91 94  BUN 11 16  CREATININE 0.36* 0.43*  CALCIUM 8.2* 8.2*   PT/INR No results for input(s): LABPROT, INR in the last 72 hours. ABG No results for input(s): PHART, HCO3 in the last 72 hours.  Invalid input(s): PCO2, PO2  Studies/Results: No results found.  Anti-infectives: Anti-infectives    Start     Dose/Rate Route Frequency Ordered Stop   03/04/15 1800  ciprofloxacin (CIPRO) IVPB 400 mg     400 mg 200 mL/hr over 60 Minutes Intravenous Every 12 hours 03/04/15 1602 03/04/15 1900   03/04/15 0722  ceFAZolin (ANCEF) IVPB 2 g/50 mL premix     2 g 100 mL/hr over 30 Minutes Intravenous On call to O.R. 03/04/15 [id number] 03/04/15 0921      Assessment/Plan: s/p Procedure(s): DIAGNOSTIC LAPAROSCOPY, EXTENDED RIGHT HEPATECTOMY, WITH INTRAOPERATIVE ULTRASOUND (N/A) Cont low fat solid foods as  tolerated OOB, PT today Hyponatremia: rising appropriately with salt tabs.  Cont min fluids and salt tabs.  Suspect this is due to bile leak. Phos and Mag levels ok Continue drain, change dressing daily Will try to switch to PO pain meds.  Will start with low dose since she is so sensitive to narcotics    LOS: 7 days    Jahsir Rama C. [id number]

## 2015-03-11 NOTE — Care Management Important Message (Signed)
Important Message  Patient Details  Name: AELLA SHACKELFORD MRN: [id number] Date of Birth: [birth year]   Medicare Important Message Given:  Yes    Camillo Flaming 03/11/2015, 11:09 AMImportant Message  Patient Details  Name: GIANNIE MCPHATTER MRN: [id number] Date of Birth: 1945-08-18   Medicare Important Message Given:  Yes    Camillo Flaming 03/11/2015, 11:09 AM

## 2015-03-12 LAB — COMPREHENSIVE METABOLIC PANEL
ALT: 50 U/L (ref 14–54)
AST: 26 U/L (ref 15–41)
Albumin: 3.4 g/dL — ABNORMAL LOW (ref 3.5–5.0)
Alkaline Phosphatase: 80 U/L (ref 38–126)
Anion gap: 7 (ref 5–15)
BUN: 13 mg/dL (ref 6–20)
CO2: 27 mmol/L (ref 22–32)
Calcium: 8.4 mg/dL — ABNORMAL LOW (ref 8.9–10.3)
Chloride: 94 mmol/L — ABNORMAL LOW (ref 101–111)
Creatinine, Ser: 0.37 mg/dL — ABNORMAL LOW (ref 0.44–1.00)
GFR calc Af Amer: >60 mL/min (ref >60)
GFR calc non Af Amer: >60 mL/min (ref >60)
Glucose, Bld: 99 mg/dL (ref 65–99)
Potassium: 3.8 mmol/L (ref 3.5–5.1)
Sodium: 128 mmol/L — ABNORMAL LOW (ref 135–145)
Total Bilirubin: 1.1 mg/dL (ref 0.3–1.2)
Total Protein: 5 g/dL — ABNORMAL LOW (ref 6.5–8.1)

## 2015-03-12 MED ORDER — BISACODYL 10 MG RE SUPP
10.0000 mg | Freq: Once | RECTAL | Status: AC
  Administered 2015-03-12: 10 mg via RECTAL
  Filled 2015-03-12: qty 1

## 2015-03-12 NOTE — Progress Notes (Addendum)
Nutrition Follow-up  DOCUMENTATION CODES:   Non-severe (moderate) malnutrition in context of chronic illness, Underweight  INTERVENTION:  - Continue Boost Breeze TID - RD will continue to monitor for needs  NUTRITION DIAGNOSIS:   Inadequate protein intake related to other (see comment) (current diet order) as evidenced by other (see comment) (CLD does not meet estimated protein needs.). -resolving with diet advancement and Boost Breeze supplement  GOAL:   Patient will meet greater than or equal to 90% of their needs -variably met  MONITOR:   PO intake, Supplement acceptance, Weight trends, Labs, Skin, I & O's  ASSESSMENT:   69 year old female who presents with hepatic cancer. Patient is a 69 year old female who presented with abdominal pain. She is referred by Dr. Burr Medico. She has had this pain for 4 years and ultrasound showed gallstones. She had continued to have an increase in pain in the last 8-9 months, decreased appetite, fatigue, and weight loss. She went to see vascular surgery regarding a blue toe. CT angiogram demonstrated a 6.8 liver mass and a pancreatic lesion. MRI was performed which demonstrated that the pancreatic mass was likely a complex cyst. The liver mass was in 5 and 4B with capsular retraction. She had a mildly enlarged portal caval node. There were no findings indicative of severe cirrhosis or portal hypertension.   12/22 Pt's diet advancement as follows: 12/16 @ 0932: CLD 12/19 @ 6712: FLD 12/20 @ 4580: Heart Health  Pt reports improving appetite and denies abdominal pain or nausea with intakes but does get full quickly and sometimes experiences pressure due to fullness. She states that she has been eating well overall. Chart review indicates that pt ate 35% of breakfast and 25% of dinner 12/20 and 75% of dinner yesterday (12/21). She states that she had breakfast this AM and plans to drink Boost Breeze for lunch and then eat dinner. Pt is content with Boost  Breeze; will not change to Ensure Enlive at this time but will monitor for need to switch supplement if intakes remain suboptimal in order for pt to have adequate protein. She states that at home she would typically eat small meals/snacks throughout the day.   Per chart review, weight up 3 lbs since last assessment. She asks about good sources of caffeine and advised her, based on her food and drink preferences, that hot tea, coffee, and dark chocolate are good sources. Pt not fully meeting needs most days. Medications reviewed. Labs reviewed; Na: 128 mmol/L, Cl: 94 mmol/L, creatinine low, Ca: 8.4 mg/dL.   12/16 - Pt's diet advanced from NPO to CLD this AM and she was consuming jello and chicken broth at time of RD visit.  - Pt states she has been experiencing nausea since surgery but is not experiencing nausea at this time.  - Pt had surgery 03/04/15: Diagnostic laparoscopy, liver biopsies, intraoperative liver ultrasound, extended right hepatic lobectomy, portal lymph node dissection, placement of biliary stent. - She states fair appetite PTA and that this has been ongoing for at least several months, but unable to give exact time frame.  - She denies chewing or swallowing problems or taste alteration PTA.  - Pt reports about a year ago she weighed 120 lbs and prior to this her UBW was 135-140 lbs.  - Current weight of 117 lbs.  - Per chart review, weight has been stable since 12/04/14.  - Moderate muscle wasting to upper body noted, no fat wasting, no edema.  - Will order [employer] and  switch to Ensure Enlive with continued diet advancement.   Diet Order:  Diet Heart Room service appropriate?: Yes; Fluid consistency:: Thin  Skin:  Wound (see comment) (Abdominal incision from 03/04/15)  Last BM:  12/22 per pt report  Height:   Ht Readings from Last 1 Encounters:  03/10/15 5' 6"  (1.676 m)    Weight:   Wt Readings from Last 1 Encounters:  03/10/15 120 lb 5.9 oz (54.6 kg)     Ideal Body Weight:  59.09 kg (kg)  BMI:  Body mass index is 19.44 kg/(m^2).  Estimated Nutritional Needs:   Kcal:  2174-7159  Protein:  65-80 grams  Fluid:  1.9-2.1 L/day  EDUCATION NEEDS:   No education needs identified at this time     Jarome Matin, RD, LDN Inpatient Clinical Dietitian Pager # [phone] After hours/weekend pager # [phone]

## 2015-03-12 NOTE — Progress Notes (Signed)
8 Days Post-Op  Subjective: Tolerating a diet.  Passing flatus. No BM's.  Pain controlled.  Ambulating some.  JP drainage coming down, Na level back down today, pt drinking a lot of water  Objective: Vital signs in last 24 hours: Temp:  [97.4 F (36.3 C)-97.8 F (36.6 C)] 97.8 F (36.6 C) (12/22 0526) Pulse Rate:  [77-94] 77 (12/22 0526) Resp:  [16-20] 16 (12/22 0526) BP: (108-132)/(45-52) 132/45 mmHg (12/22 0526) SpO2:  [95 %-99 %] 95 % (12/22 0526) Last BM Date:  (prior to surgery- per patient. )  Intake/Output from previous day: 12/21 0701 - 12/22 0700 In: 1879 [P.O.:1197; I.V.:682] Out: 145 [Drains:145] Intake/Output this shift:    Resp: clear to auscultation bilaterally Cardio: regular rate and rhythm GI: soft, appropriately tender.  Incision: clean, dry Drain: clear bile   Lab Results:   Recent Labs  03/10/15 0855 03/11/15 0441  WBC 11.4* 11.2*  HGB 9.3* 8.8*  HCT 27.6* 25.6*  PLT 238 234   BMET  Recent Labs  03/11/15 0441 03/12/15 0434  NA 131* 128*  K 4.2 3.8  CL 94* 94*  CO2 30 27  GLUCOSE 94 99  BUN 16 13  CREATININE 0.43* 0.37*  CALCIUM 8.2* 8.4*   PT/INR No results for input(s): LABPROT, INR in the last 72 hours. ABG No results for input(s): PHART, HCO3 in the last 72 hours.  Invalid input(s): PCO2, PO2  Studies/Results: No results found.  Anti-infectives: Anti-infectives    Start     Dose/Rate Route Frequency Ordered Stop   03/04/15 1800  ciprofloxacin (CIPRO) IVPB 400 mg     400 mg 200 mL/hr over 60 Minutes Intravenous Every 12 hours 03/04/15 1602 03/04/15 1900   03/04/15 0722  ceFAZolin (ANCEF) IVPB 2 g/50 mL premix     2 g 100 mL/hr over 30 Minutes Intravenous On call to O.R. 03/04/15 [id number] 03/04/15 0921      Assessment/Plan: s/p Procedure(s): DIAGNOSTIC LAPAROSCOPY, EXTENDED RIGHT HEPATECTOMY, WITH INTRAOPERATIVE ULTRASOUND (N/A) Cont low fat solid foods as tolerated OOB, Ambulate Strict I&O's to measure UOP  better Hyponatremia: levels down today.  Will have pt self fluid restrict her water intake today.  Saline lock IV and cont salt tabs.  Continue drain, change dressing daily Cont PO pain meds. Seems to be working well for her    LOS: 8 days    [place], Cleatus Gabriel C. [id number]

## 2015-03-12 NOTE — Progress Notes (Signed)
Physical Therapy Treatment Patient Details Name: Tina Bailey MRN: [id number] DOB: May 23, 1945 Today's Date: 03/12/2015    History of Present Illness 69 yo female s/p liver biopsy, R hepatic lobectomy, biliary stent, lymph node dissection 03/04/15.     PT Comments    Pt feeling better.  IV D/C.  Assisted OOB to amb to bathroom.  Pt able to perform own self care/hygiene.  Stood at sink x 4 min to brush dentures.  Then amb in hallway with NO RW this time.  Avg HR 124 with short spikes to 130.  No c/o.  Mild incision "tightness".  Eager to D/C to home once sodium level are ideal.    Follow Up Recommendations  No PT follow up (pt/family feel no HH services are needed)     Equipment Recommendations  None recommended by PT    Recommendations for Other Services       Precautions / Restrictions Precautions Precautions: Fall Precaution Comments: monitor HR/ABD incision/R drain bulb Restrictions Weight Bearing Restrictions: No    Mobility  Bed Mobility Overal bed mobility: Modified Independent             General bed mobility comments: increased time  Transfers Overall transfer level: Needs assistance Equipment used: None Transfers: Sit to/from Stand Sit to Stand: Supervision         General transfer comment: increased time but good safety cognition and use of hands  Ambulation/Gait Ambulation/Gait assistance: Supervision;Min guard Ambulation Distance (Feet): 85 Feet Assistive device: None   Gait velocity: WFL   General Gait Details: this session amb with out AD.  Good alternating gait with no true LOB.  Avg HF 124 with short spikes to 130.  No c/o SOB/dyspnea.  Tolerated well.   Stairs            Wheelchair Mobility    Modified Rankin (Stroke Patients Only)       Balance                                    Cognition Arousal/Alertness: Awake/alert Behavior During Therapy: WFL for tasks assessed/performed Overall Cognitive  Status: Within Functional Limits for tasks assessed                      Exercises      General Comments        Pertinent Vitals/Pain Pain Assessment: Faces Faces Pain Scale: Hurts a little bit Pain Location: ABD soreness/tightness/incision Pain Descriptors / Indicators: Sore;Tightness Pain Intervention(s): Monitored during session;Repositioned    Home Living                      Prior Function            PT Goals (current goals can now be found in the care plan section) Progress towards PT goals: Progressing toward goals    Frequency  Min 3X/week    PT Plan Current plan remains appropriate    Co-evaluation             End of Session Equipment Utilized During Treatment: Gait belt Activity Tolerance: Patient tolerated treatment well Patient left: in chair;with call bell/phone within reach;with chair alarm set;with family/visitor present     Time: 0948-1000 PT Time Calculation (min) (ACUTE ONLY): 12 min  Charges:  $Gait Training: 8-22 mins  G Codes:      Rica Koyanagi  PTA [facility]  Acute  Rehab Pager      [phone]

## 2015-03-13 LAB — BASIC METABOLIC PANEL
Anion gap: 7 (ref 5–15)
BUN: 11 mg/dL (ref 6–20)
CO2: 27 mmol/L (ref 22–32)
Calcium: 8.4 mg/dL — ABNORMAL LOW (ref 8.9–10.3)
Chloride: 95 mmol/L — ABNORMAL LOW (ref 101–111)
Creatinine, Ser: 0.39 mg/dL — ABNORMAL LOW (ref 0.44–1.00)
GFR calc Af Amer: >60 mL/min (ref >60)
GFR calc non Af Amer: >60 mL/min (ref >60)
Glucose, Bld: 85 mg/dL (ref 65–99)
Potassium: 3.6 mmol/L (ref 3.5–5.1)
Sodium: 129 mmol/L — ABNORMAL LOW (ref 135–145)

## 2015-03-13 LAB — PHOSPHORUS: Phosphorus: 3.5 mg/dL (ref 2.5–4.6)

## 2015-03-13 LAB — MAGNESIUM: Magnesium: 1.6 mg/dL — ABNORMAL LOW (ref 1.7–2.4)

## 2015-03-13 MED ORDER — MAGNESIUM OXIDE 400 (241.3 MG) MG PO TABS
800.0000 mg | ORAL_TABLET | Freq: Once | ORAL | Status: AC
  Administered 2015-03-13: 800 mg via ORAL
  Filled 2015-03-13: qty 2

## 2015-03-13 MED ORDER — HYDROCODONE-ACETAMINOPHEN 5-325 MG PO TABS: 1.0000 | ORAL_TABLET | ORAL | Status: DC | PRN

## 2015-03-13 MED ORDER — SODIUM CHLORIDE 1 G PO TABS: 1.0000 g | ORAL_TABLET | Freq: Two times a day (BID) | ORAL | Status: DC

## 2015-03-13 NOTE — Progress Notes (Signed)
9 Days Post-Op  Subjective: Tolerating a diet.  Passing flatus. Having BM's.  Pain controlled.  Ambulating some.  JP drainage coming down, Na level back up today  Objective: Vital signs in last 24 hours: Temp:  [97.8 F (36.6 C)-98.5 F (36.9 C)] 97.8 F (36.6 C) (12/23 0453) Pulse Rate:  [82-96] 94 (12/23 0453) Resp:  [18] 18 (12/23 0453) BP: (113-116)/(40-58) 115/58 mmHg (12/23 0453) SpO2:  [95 %-98 %] 95 % (12/23 0453) Last BM Date: 03/13/15  Intake/Output from previous day: 12/22 0701 - 12/23 0700 In: 120 [P.O.:120] Out: 980 [Urine:825; Drains:155] Intake/Output this shift:    Resp: clear to auscultation bilaterally Cardio: regular rate and rhythm GI: soft, appropriately tender.  Incision: clean, dry Drain: clear bile   Lab Results:   Recent Labs  03/10/15 0855 03/11/15 0441  WBC 11.4* 11.2*  HGB 9.3* 8.8*  HCT 27.6* 25.6*  PLT 238 234   BMET  Recent Labs  03/12/15 0434 03/13/15 0447  NA 128* 129*  K 3.8 3.6  CL 94* 95*  CO2 27 27  GLUCOSE 99 85  BUN 13 11  CREATININE 0.37* 0.39*  CALCIUM 8.4* 8.4*   PT/INR No results for input(s): LABPROT, INR in the last 72 hours. ABG No results for input(s): PHART, HCO3 in the last 72 hours.  Invalid input(s): PCO2, PO2  Studies/Results: No results found.  Anti-infectives: Anti-infectives    Start     Dose/Rate Route Frequency Ordered Stop   03/04/15 1800  ciprofloxacin (CIPRO) IVPB 400 mg     400 mg 200 mL/hr over 60 Minutes Intravenous Every 12 hours 03/04/15 1602 03/04/15 1900   03/04/15 0722  ceFAZolin (ANCEF) IVPB 2 g/50 mL premix     2 g 100 mL/hr over 30 Minutes Intravenous On call to O.R. 03/04/15 [id number] 03/04/15 0921      Assessment/Plan: s/p Procedure(s): DIAGNOSTIC LAPAROSCOPY, EXTENDED RIGHT HEPATECTOMY, WITH INTRAOPERATIVE ULTRASOUND (N/A) Cont low fat solid foods, nutritional supplements  Hyponatremia: levels up today with mild fluid restriction and salt tabs.   Continue drain,  change dressing daily Cont PO pain meds. Seems to be working well for her D/C home    LOS: 9 days    [name], [name] [id number]

## 2015-03-13 NOTE — Discharge Instructions (Addendum)
ABDOMINAL SURGERY: POST OP INSTRUCTIONS  1. DIET: Follow a light bland diet the first 24 hours after arrival home, such as soup, liquids, crackers, etc.  Be sure to include lots of fluids daily.  Avoid fast food or heavy meals as your are more likely to get nauseated.  Eat a low fat the next few days after surgery.  Avoid drinking water until you follow up with Dr Barry Dienes.  Ok to drink caffeinated beverages.   2. Take your usually prescribed home medications unless otherwise directed. 3. PAIN CONTROL: a. Pain is best controlled by a usual combination of three different methods TOGETHER: i. Ice/Heat ii. Over the counter pain medication iii. Prescription pain medication b. Most patients will experience some swelling and bruising around the incisions.  Ice packs or heating pads (30-60 minutes up to 6 times a day) will help. Use ice for the first few days to help decrease swelling and bruising, then switch to heat to help relax tight/sore spots and speed recovery.  Some people prefer to use ice alone, heat alone, alternating between ice & heat.  Experiment to what works for you.  Swelling and bruising can take several weeks to resolve.   c. It is helpful to take an over-the-counter pain medication regularly for the first few weeks.  Choose one of the following that works best for you: i. Naproxen (Aleve, etc)  Two 220mg  tabs twice a day ii. Ibuprofen (Advil, etc) Three 200mg  tabs four times a day (every meal & bedtime) d. A  prescription for pain medication (such as oxycodone, hydrocodone, etc) should be given to you upon discharge.  Take your pain medication as prescribed.  i. If you are having problems/concerns with the prescription medicine (does not control pain, nausea, vomiting, rash, itching, etc), please call us [phone] to see if we need to switch you to a different pain medicine that will work better for you and/or control your side effect better. ii. If you need a refill on your pain  medication, please contact your pharmacy.  They will contact our office to request authorization. Prescriptions will not be filled after 5 pm or on week-ends. 4. Avoid getting constipated.  Between the surgery and the pain medications, it is common to experience some constipation.  Increasing fluid intake and taking a fiber supplement (such as Metamucil, Citrucel, FiberCon, MiraLax, etc) 1-2 times a day regularly will usually help prevent this problem from occurring.  A mild laxative (prune juice, Milk of Magnesia, MiraLax, etc) should be taken according to package directions if there are no bowel movements after 48 hours.   5. Watch out for diarrhea.  If you have many loose bowel movements, simplify your diet to bland foods & liquids for a few days.  Stop any stool softeners and decrease your fiber supplement.  Switching to mild anti-diarrheal medications (Kayopectate, Pepto Bismol) can help.  If this worsens or does not improve, please call us. 6. Wash / shower every day.  You may shower over the incision / wound.  Avoid baths until the skin is fully healed.  Continue to shower over incision(s) after the dressing is off. 7. Remove your waterproof bandages 5 days after surgery.  You may leave the incision open to air.  You may replace a dressing/Band-Aid to cover the incision for comfort if you wish. 8. ACTIVITIES as tolerated:   a. You may resume regular (light) daily activities beginning the next day--such as daily self-care, walking, climbing stairs--gradually increasing activities as tolerated.  If you can walk 30 minutes without difficulty, it is safe to try more intense activity such as jogging, treadmill, bicycling, low-impact aerobics, swimming, etc. b. Save the most intensive and strenuous activity for last such as sit-ups, heavy lifting, contact sports, etc  Refrain from any heavy lifting or straining until you are off narcotics for pain control.   c. DO NOT PUSH THROUGH PAIN.  Let pain be your  guide: If it hurts to do something, don't do it.  Pain is your body warning you to avoid that activity for another week until the pain goes down. d. You may drive when you are no longer taking prescription pain medication, you can comfortably wear a seatbelt, and you can safely maneuver your car and apply brakes. e. Dennis Bast may have sexual intercourse when it is comfortable.  9. FOLLOW UP in our office a. Please call [facility] at [phone] to set up an appointment to see your surgeon in the office for a follow-up appointment approximately 1-2 weeks after your surgery. b. Make sure that you call for this appointment the day you arrive home to insure a convenient appointment time. 10. IF YOU HAVE DISABILITY OR FAMILY LEAVE FORMS, BRING THEM TO THE OFFICE FOR PROCESSING.  DO NOT GIVE THEM TO YOUR DOCTOR.  11.       Empty and record drain output daily.  Change dressing around drain daily.  WHEN TO CALL us [phone]: 1. Poor pain control 2. Reactions / problems with new medications (rash/itching, nausea, etc)  3. Fever over 101.5 F (38.5 C) 4. Inability to urinate 5. Nausea and/or vomiting 6. Worsening swelling or bruising 7. Continued bleeding from incision. 8. Increased pain, redness, or drainage from the incision  The clinic staff is available to answer your questions during regular business hours (8:30am-5pm).  Please dont hesitate to call and ask to speak to one of our nurses for clinical concerns.   A surgeon from [facility] is always on call at the hospitals   If you have a medical emergency, go to the nearest emergency room or call 911.    [facility], [place], [place], [place], [place]  [address] ? MAIN: [phone] ? TOLL FREE: [phone] ? FAX [phone] [email or web address]

## 2015-03-13 NOTE — Care Management Note (Signed)
Case Management Note  Patient Details  Name: ROSALAND ALTIMARI MRN: [id number] Date of Birth: [birth year]  Subjective/Objective:                    Action/Plan:d/c home no needs or orders.   Expected Discharge Date:                  Expected Discharge Plan:  Home/Self Care  In-House Referral:  NA  Discharge planning Services  CM Consult  Post Acute Care Choice:  NA Choice offered to:  NA  DME Arranged:    DME Agency:     HH Arranged:  Patient Refused [place] Agency:     Status of Service:  Completed, signed off  Medicare Important Message Given:  Yes Date Medicare IM Given:    Medicare IM give by:    Date Additional Medicare IM Given:    Additional Medicare Important Message give by:     If discussed at [place] of Stay Meetings, dates discussed:    Additional Comments:  Dessa Phi, RN 03/13/2015, 10:20 AM

## 2015-03-31 NOTE — Discharge Summary (Signed)
Physician Discharge Summary  Patient ID: Tina Bailey MRN: [id number] DOB/AGE: 1945/08/02 70 y.o.  Admit date: 03/04/2015 Discharge date: 03/31/2015  Admission Diagnoses: Patient Active Problem List   Diagnosis Date Noted  . Hepatocellular adenoma 03/04/2015  . COPD GOLD III  03/03/2015  . Cigarette smoker 03/03/2015  . Cough 03/03/2015  . Liver cancer ([place]) 01/13/2015  . Hepatocellular carcinoma ([place]) 12/30/2014  . Cancer, hepatocellular ([place])   . Liver mass 12/04/2014  . Pancreatic mass 12/04/2014  . 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 Hepatocellular adenoma 03/04/2015  . COPD GOLD III  03/03/2015  . Cigarette smoker 03/03/2015  . Cough 03/03/2015  . Liver cancer ([place]) 01/13/2015  . Hepatocellular carcinoma ([place]) 12/30/2014  . Cancer, hepatocellular ([place])   . Liver mass 12/04/2014  . Pancreatic mass 12/04/2014  . 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 She required a bit of neosynephrine drip.  She was given albumin to help improve hydration status.  She was placed on a nicotine patch for tobacco abuse.  Her diet was advanced slowly.  She was given aggressive pulmonary toilet due to her COPD.  She was transferred to floor when stable.  Her foley was able to be removed.  She had a small bile leak and her drain was left in.  She also developed severe hyponatremia.  This was treated with salt tabs and fluid restriction.  This improved significantly.  She was able to transition to oral narcotics.  She was stable for d/c and was sent home with drain.    Consults: pulmonary/intensive care  Significant Diagnostic Studies: labs: Na 129.  Cl 95, HCT 25.6  Treatments: surgery: see above and pulmonary toilet.    Discharge Exam: Blood pressure 115/58, pulse 94, temperature 97.8 F (36.6 C), temperature source Oral, resp. rate 18, height 5\' 6"  (1.676 m), weight 54.6 kg (120 lb 5.9 oz), SpO2 95 %. General appearance: alert, cooperative and no distress Resp: breathing comfortably Cardio: regular rate and rhythm GI: soft, approp tender.  yellow drain output.    Disposition: 01-Home or Self Care     Medication List    TAKE these medications        HYDROcodone-acetaminophen 5-325 MG tablet  Commonly known as:  NORCO/VICODIN  Take 1 tablet by mouth every 4 (four) hours as needed for moderate pain or severe pain.     ibuprofen 200 MG tablet  Commonly known as:  ADVIL,MOTRIN  Take 200 mg by mouth every 6 (six) hours as needed for mild pain.     multivitamin with minerals Tabs tablet  Take 1 tablet by mouth daily.     REFRESH TEARS 0.5 % Soln  Generic drug:  carboxymethylcellulose  Place 1-2 drops into both eyes 3 (three) times daily as needed (for dry eyes).     sodium chloride 1 g tablet  Take 1 tablet (1 g total) by mouth 2 (two) times daily with a meal.           Follow-up Information    Follow up with [facility], MD. Schedule an appointment as soon as possible for a visit in 2 weeks.   Specialty:  General Surgery   Contact information:   [address] [place] [address]  [id number]       Signed: Stark Klein 03/31/2015, 10:53 AM

## 2015-04-01 ENCOUNTER — Other Ambulatory Visit: Payer: Medicare Other

## 2015-04-01 ENCOUNTER — Encounter: Payer: Self-pay | Admitting: Hematology

## 2015-04-01 ENCOUNTER — Telehealth: Payer: Self-pay | Admitting: Hematology

## 2015-04-01 ENCOUNTER — Encounter: Payer: Medicare Other | Admitting: Hematology

## 2015-04-01 NOTE — Progress Notes (Signed)
No show  This encounter was created in error - please disregard.

## 2015-04-01 NOTE — Telephone Encounter (Signed)
patient called to r/s lab/YF from 1/11 to 1/23 - patient has new date/time

## 2015-04-13 ENCOUNTER — Other Ambulatory Visit: Payer: Medicare Other

## 2015-04-13 ENCOUNTER — Telehealth: Payer: Self-pay | Admitting: *Deleted

## 2015-04-13 ENCOUNTER — Encounter: Payer: Medicare Other | Admitting: Hematology

## 2015-04-13 ENCOUNTER — Encounter: Payer: Self-pay | Admitting: Hematology

## 2015-04-13 NOTE — Telephone Encounter (Signed)
  Oncology Nurse Navigator Documentation  Navigator Location: [facility] (04/13/15 1608) Navigator Encounter Type: Telephone (04/13/15 1608) Telephone: [place] Call (04/13/15 1608)  Called to follow up on her [place] today. She had forgotten about her appointment today. Apologized and asked to reschedule. Sent POF to scheduler to call her. She sees Psychologist, sport and exercise tomorrow.

## 2015-04-13 NOTE — Progress Notes (Signed)
No show  This encounter was created in error - please disregard.

## 2015-04-14 ENCOUNTER — Telehealth: Payer: Self-pay | Admitting: Hematology

## 2015-04-14 NOTE — Telephone Encounter (Signed)
cld & left pt a message to adv of missed appt-adv to call & r/s appt

## 2015-04-24 ENCOUNTER — Encounter: Payer: Self-pay | Admitting: General Practice

## 2015-04-24 ENCOUNTER — Telehealth: Payer: Self-pay | Admitting: *Deleted

## 2015-04-24 ENCOUNTER — Ambulatory Visit (HOSPITAL_BASED_OUTPATIENT_CLINIC_OR_DEPARTMENT_OTHER): Payer: Medicare Other | Admitting: Nurse Practitioner

## 2015-04-24 VITALS — BP 145/53 | HR 90 | Temp 98.0°F | Resp 17 | Ht 66.0 in | Wt 110.5 lb

## 2015-04-24 DIAGNOSIS — G8929 Other chronic pain: Secondary | ICD-10-CM | POA: Diagnosis not present

## 2015-04-24 DIAGNOSIS — C220 Liver cell carcinoma: Secondary | ICD-10-CM

## 2015-04-24 DIAGNOSIS — K5900 Constipation, unspecified: Secondary | ICD-10-CM

## 2015-04-24 DIAGNOSIS — F32A Depression, unspecified: Secondary | ICD-10-CM

## 2015-04-24 DIAGNOSIS — F329 Major depressive disorder, single episode, unspecified: Secondary | ICD-10-CM | POA: Diagnosis not present

## 2015-04-24 NOTE — Telephone Encounter (Signed)
The chaplin will be bringing pt over for a [facility] visit. Sent POF to scheduling urgently.

## 2015-04-24 NOTE — Progress Notes (Signed)
Spiritual Care Note  Familiar with chaplain from previous assistance with [employer], Tina Bailey left me VM to request counseling support.  On my return call, she shared that she has been struggling with pain, severe constipation, and related emotional distress such that yesterday she had considered self-harm for relief.  Per pt, she sought emotional support from her daughter Tina Bailey, with whom she lives; per pt, Tina Bailey was prepared to call suicide hotline yesterday if needed.  Per pt, her urgency for relief subdued, and they planned for her to reach out today for counseling support instead.    On the phone today, I assessed for immediate suicide risk and plan.  Tina Bailey verbalized her pain complaints and intention not to harm herself by any means; per pt, she did not need to pursue immediate support via 911/ED.  She verbalized desire for counseling support and symptom relief, agreeing to come to [place] directly to meet with chaplain and counseling intern, and to explore seeing Symptom Management NP.   Tina Bailey, Tina Bailey and I met with Tina Bailey for further assessment and emotional support.  Tina Bailey used opportunity to share and process her stressors and sources of meaning/hope (children, faith).  Together we developed plans for self-care and emergency support, if needed.  Tina Bailey repeatedly verbalized her plan:  Seek support from dtr Tina Bailey and SIL Tina Bailey as needed; to pursue immediate care via 911 or [facility] ED if she became concerned for her safety and had any thoughts of self-harm; to use suicide helpline as another resource; and to use self-soothing techniques to provide distraction and reduce anxiety.  She plans to use 911 as a default and also has Suicide Prevention Lifeline and [facility] Mobile Crisis numbers in case of emergency.  Tina Bailey plans to call me Monday for check-in/emotional support and made counseling appt with Tina Bailey/counseling intern on Wednesday.  Tina Bailey has  verbalized several themes that she would like to explore in counseling.  Also made immediate appt with Tina Bailey/NP for sx mgmt per pt's permission.  Accompanied pt to NP appt.  Provided pastoral presence, emotional support, and advocacy to assist pt in addressing her concerns (pain, constipation, depression) with NP.    Tina Bailey verbalized deep appreciation for team's emotional and logistical support, as well as renewed hope and curiosity about how she can grow and continue to get more from life.  Anticipate pt's call Monday, but will phone her if she does not initiate call.  Following for support, but please also page as needs arise.  Thank you.  Tina Bailey, [place], [facility] Pager [phone] Voicemail  [phone]

## 2015-04-26 ENCOUNTER — Encounter: Payer: Self-pay | Admitting: Nurse Practitioner

## 2015-04-26 DIAGNOSIS — F418 Other specified anxiety disorders: Secondary | ICD-10-CM | POA: Insufficient documentation

## 2015-04-26 DIAGNOSIS — G8929 Other chronic pain: Secondary | ICD-10-CM | POA: Insufficient documentation

## 2015-04-26 DIAGNOSIS — K5900 Constipation, unspecified: Secondary | ICD-10-CM | POA: Insufficient documentation

## 2015-04-26 NOTE — Assessment & Plan Note (Signed)
Patient states that she takes Vicodin 1 tablet 3 times per day at the most for her chronic pain.  She tries to avoid the Vicodin; so she feels that it contributes to her chronic constipation issues.  Patient was advised that she may try decreasing the Vicodin down to only half a tablet to see if that improves her constipation issues.

## 2015-04-26 NOTE — Assessment & Plan Note (Signed)
Patient states that she has become fairly depressed regarding her recent diagnosis and subsequent surgery.  She also feels overwhelmed regarding her chronic pain issues and constipation.  She called and then visited with the [place] chaplain for quite some time earlier this afternoon.  She states that she has considered suicide recently; but denies feeling suicidal at the present time.  Patient also denies any homicidal ideation.  Discussed at length with the patient regarding all additional support options.  Confirmed the patient does have the suicide hotline phone number; and understands to call 911 if she does again become actively suicidal.  Also, patient agreed to sign a safety contract with the [place] chaplain today.  Also, discussed the option of initiating and anti-depressive; the patient has decided to hold on initiation of antidepressives for the time being.  Advised patient that she could change her mind; and we could begin a antidepressant if she changes her mind.   The plan is for the patient to follow back up by phone with the [place] chaplain on Monday, 04/27/2015.

## 2015-04-26 NOTE — Assessment & Plan Note (Signed)
Patient underwent a right hepatectomy and placement of a biliary stent on 03/03/2016 per Dr. Barry Dienes.  General Psychologist, sport and exercise.  Patient was discharged from the hospital on 03/12/2016.  Patient states that she saw Dr. Barry Dienes for follow-up just last week; and was advised that she is healing well.  Considering her extensive surgery.  Patient reports that she was also advised per Dr. Thurmon Fair her postop recovery would take some time.  Patient called and then visited today with the [place] chaplain for emotional support.  Patient feels that she is depressed.  See further notes for details.  Advised patient would follow-up with Dr. Burr Medico in regards to appropriate return visit here at the cancer center.

## 2015-04-26 NOTE — Progress Notes (Signed)
SYMPTOM MANAGEMENT CLINIC   HPI: Tina Bailey 70 y.o. female diagnosed with hepatocellular adenoma.  Currently recovering from hepatectomy and placement of the biliary stent.   Patient states that she has become fairly depressed regarding her recent diagnosis and subsequent surgery.  She also feels overwhelmed regarding her chronic pain issues and constipation.  She called and then visited with the [place] chaplain for quite some time earlier this afternoon.  She states that she has considered suicide recently; but denies feeling suicidal at the present time.  Patient also denies any homicidal ideation.  Discussed at length with the patient regarding all additional support options.  Confirmed the patient does have the suicide hotline phone number; and understands to call 911 if she does again become actively suicidal.  Also, patient agreed to sign a safety contract with the [place] chaplain today.  Also, discussed the option of initiating and anti-depressive; the patient has decided to hold on initiation of antidepressives for the time being.  Advised patient that she could change her mind; and we could begin a antidepressant if she changes her mind.   The plan is for the patient to follow back up by phone with the [place] chaplain on Monday, 04/27/2015.  HPI  ROS  Past Medical History  Diagnosis Date  . COPD (chronic obstructive pulmonary disease) ([place])   . Productive cough   . Wears glasses   . Fibromyalgia   . Retina disorder     L eye, vision distorted, edema  . Macular degeneration     R eye  . 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 Osteoporosis   . Spine fracture due to birth trauma   . Cancer ([place]) 12/2014    liver cancer  . Complication of anesthesia   . PONV (postoperative nausea and vomiting)     nausea,severe vomiting after 01-13-15 portal vein embolization  . Arthritis     DDD. Right shoulder"is frozen"-limited ROM.  Marland Kitchen TMJ disease      Past Surgical History  Procedure Laterality Date  . Partial hysterectomy    . Kidney stone surgery    . Angioplasty  2008    no stents required, no follow-up with cardiologist, no recurrent chest pain  . Cataract extraction, bilateral Bilateral   . Portal vein embolization      01-13-15 -Dr. Cathlean Sauer.  . Eye surgery      cornea surgery  . Laparoscopic partial hepatectomy N/A 03/04/2015    Procedure: DIAGNOSTIC LAPAROSCOPY, EXTENDED RIGHT HEPATECTOMY, WITH INTRAOPERATIVE ULTRASOUND;  Surgeon: Stark Klein, MD;  Location: [facility];  Service: General;  Laterality: N/A;    has Blue toe syndrome ([place]); Liver mass; Pancreatic mass; Hepatocellular carcinoma ([place]); Cancer, hepatocellular ([place]); Liver cancer ([place]); COPD GOLD III ; Cigarette smoker; Cough; Hepatocellular adenoma; Constipation; Chronic pain; and Depression on her problem list.    is allergic to amoxicillin-pot clavulanate; paroxetine hcl; and sulfa antibiotics.    Medication List       This list is accurate as of: 04/24/15 11:59 PM.  Always use your most recent med list.               HYDROcodone-acetaminophen 5-325 MG tablet  Commonly known as:  NORCO/VICODIN  Take 1 tablet by mouth every 4 (four) hours as needed for moderate pain or severe pain.     ibuprofen 200 MG tablet  Commonly known as:  ADVIL,MOTRIN  Take 200 mg by mouth every 6 (six) hours as needed  for mild pain.     multivitamin with minerals Tabs tablet  Take 1 tablet by mouth daily.     REFRESH TEARS 0.5 % Soln  Generic drug:  carboxymethylcellulose  Place 1-2 drops into both eyes 3 (three) times daily as needed (for dry eyes).     sodium chloride 1 g tablet  Take 1 tablet (1 g total) by mouth 2 (two) times daily with a meal.         PHYSICAL EXAMINATION  Oncology Vitals 04/24/2015 03/13/2015  Height 168 cm -  Weight 50.122 kg -  Weight (lbs) 110 lbs 8 oz -  BMI (kg/m2) 17.83 kg/m2 -  Temp 98 97.8  Pulse 90 94  Resp 17 18  SpO2  98 95  BSA (m2) 1.53 m2 -   BP Readings from Last 2 Encounters:  04/24/15 145/53  03/13/15 115/58    Physical Exam  Constitutional: She is oriented to person, place, and time and well-developed, well-nourished, and in no distress.  HENT:  Head: Normocephalic and atraumatic.  Eyes: Conjunctivae and EOM are normal. Pupils are equal, round, and reactive to light. Right eye exhibits no discharge. Left eye exhibits no discharge. No scleral icterus.  Neck: Normal range of motion.  Pulmonary/Chest: Effort normal. No respiratory distress.  Musculoskeletal: Normal range of motion.  Neurological: She is alert and oriented to person, place, and time. Gait normal.  Psychiatric: Affect normal.  Patient was upbeat and talkative during exam today.    LABORATORY DATA:. No visits with results within 3 Day(s) from this visit. Latest known visit with results is:  Admission on 03/04/2015, Discharged on 03/13/2015  No results displayed because visit has over 200 results.       RADIOGRAPHIC STUDIES: No results found.  ASSESSMENT/PLAN:    Hepatocellular carcinoma [facility]) Patient underwent a right hepatectomy and placement of a biliary stent on 03/03/2016 per Dr. Barry Dienes.  General Psychologist, sport and exercise.  Patient was discharged from the hospital on 03/12/2016.  Patient states that she saw Dr. Barry Dienes for follow-up just last week; and was advised that she is healing well.  Considering her extensive surgery.  Patient reports that she was also advised per Dr. Thurmon Fair her postop recovery would take some time.  Patient called and then visited today with the [place] chaplain for emotional support.  Patient feels that she is depressed.  See further notes for details.  Advised patient would follow-up with Dr. Burr Medico in regards to appropriate return visit here at the cancer center.  Depression Patient states that she has become fairly depressed regarding her recent diagnosis and subsequent surgery.  She also feels  overwhelmed regarding her chronic pain issues and constipation.  She called and then visited with the [place] chaplain for quite some time earlier this afternoon.  She states that she has considered suicide recently; but denies feeling suicidal at the present time.  Patient also denies any homicidal ideation.  Discussed at length with the patient regarding all additional support options.  Confirmed the patient does have the suicide hotline phone number; and understands to call 911 if she does again become actively suicidal.  Also, patient agreed to sign a safety contract with the [place] chaplain today.  Also, discussed the option of initiating and anti-depressive; the patient has decided to hold on initiation of antidepressives for the time being.  Advised patient that she could change her mind; and we could begin a antidepressant if she changes her mind.   The plan is for  the patient to follow back up by phone with the cancer Center chaplain on Monday, 04/27/2015.  Constipation Patient has a history of chronic constipation.  She states that she typically takes one stool softener and occasional recollects once per day for her chronic constipation.  She feels that taking the Vicodin increases her issues with constipation.  Patient states that she typically only takes 1 Vicodin 3 times per day at the most.  Long discussion with patient regarding constipation treatment.  Patient was advised that she may increase the stool softeners to twice daily; and take [place] up to every 6 hours until her constipation clears.  Wrote all bowel regimen instructions down for the patient as well.  Chronic pain Patient states that she takes Vicodin 1 tablet 3 times per day at the most for her chronic pain.  She tries to avoid the Vicodin; so she feels that it contributes to her chronic constipation issues.  Patient was advised that she may try decreasing the Vicodin down to only half a tablet to see  if that improves her constipation issues.   Patient stated understanding of all instructions; and was in agreement with this plan of care. The patient knows to call the clinic with any problems, questions or concerns.   Review/collaboration with Dr. Burr Medico regarding all aspects of patient's visit today.   Total time spent with patient was approximately 70 minutes.  Extended service minutes;  with greater than 80% percent of that time spent in face to face counseling regarding patient's symptoms,  and coordination of care and follow up.  Disclaimer:This dictation was prepared with Dragon/digital dictation along with [employer]. Any transcriptional errors that result from this process are unintentional.  Drue Second, NP 04/26/2015

## 2015-04-26 NOTE — Assessment & Plan Note (Addendum)
Patient has a history of chronic constipation.  She states that she typically takes one stool softener and occasional recollects once per day for her chronic constipation.  She feels that taking the Vicodin increases her issues with constipation.  Patient states that she typically only takes 1 Vicodin 3 times per day at the most.  Long discussion with patient regarding constipation treatment.  Patient was advised that she may increase the stool softeners to twice daily; and take [place] up to every 6 hours until her constipation clears.  Wrote all bowel regimen instructions down for the patient as well.

## 2015-04-27 ENCOUNTER — Encounter: Payer: Self-pay | Admitting: General Practice

## 2015-04-27 NOTE — Progress Notes (Signed)
Spiritual Care Note  Tina Bailey left VM update this morning per request.  Upon return call, she verbalized her inward struggle to allow herself to practice regular bowel and pain med regimens because "I'm so used to doing it myself"--that is, to managing symptoms with a one-time response instead of a long-term plan/habit.  She described experiencing breakthrough pain this weekend that her to discover a pain med combination/schedule that worked well for her; per pt, this success helped persuade her of the usefulness of adhering to a regular pain-med schedule.    Tina Bailey also verbalized her "commitment not to commit suicide," reiterating her understanding that "I need to give it until March or April to see how I feel after really healing from my surgery."  She verbalized abstract fear of possible future cancer recurrence, and we talked about how her medical team could help her explore risks and benefits of treatment if the need arose so that she could make a clear, deliberate, informed decision about what kind of tx she would accept or decline.  She indicated that having a choice about treatment was empowering and reduced anxiety.  She verbalized commitment to seeking advice from her medical team (in case of recurrence, for example) instead of reacting with self-harm/suicide.  She verbalized benefit of responding and making a deliberate choice instead of reacting.  Tina Bailey repeated her appreciation for team's care on Friday and chaplain support again today.  She verbalized looking forward to counseling appointment with Tina Bailey on Wednesday, identifying several themes she would like to explore with counseling support.  Tina Bailey is aware of ongoing [place] and medical team availability for support, but please always page as needs arise/circumstances change.  Thank you.  [place], [place], [facility] Pager [phone] Voicemail  [phone]

## 2015-04-28 ENCOUNTER — Telehealth: Payer: Self-pay | Admitting: *Deleted

## 2015-04-28 NOTE — Telephone Encounter (Signed)
Oncology Nurse Navigator Documentation  Oncology Nurse Navigator Flowsheets 04/28/2015  Navigator Location [facility]  Navigator Encounter Type Telephone  Telephone Outgoing Call;Appt Confirmation/Clarification  Spoke with female answering her phone. She is not home. Suggested nurse call back tomorrow-best to reach her between 9-10am.

## 2015-04-29 ENCOUNTER — Telehealth: Payer: Self-pay | Admitting: *Deleted

## 2015-04-29 ENCOUNTER — Telehealth: Payer: Self-pay | Admitting: Hematology

## 2015-04-29 NOTE — Progress Notes (Signed)
Counseling intern Note: Met with Pt for intake counseling session. Completed intake paperwork and assessments and discussed presenting concerns (family dynamic, low social involvement, negative thought patterns) and possible goals for counseling (explore emotions related to major life events, increase positive outlook). Discussed recent SI and pt stated that she feels confident she can keep herself safe until our next meeting. Reviewed emergency hotline and plan to tell daughter or call 911 if she begins to feel as though she may harm herself.  FU apt scheduled for 2/15  Vaughan Sine Counseling Intern  [phone]

## 2015-04-29 NOTE — Telephone Encounter (Signed)
Oncology Nurse Navigator Documentation  Oncology Nurse Navigator Flowsheets 04/29/2015  Navigator Location [facility]  Navigator Encounter Type Telephone  Telephone Outgoing Call;Appt Confirmation/Clarification  Attempted to reach patient again today-she was not home. Female answering phone said she was on the way to cancer center to see counselor and will go by scheduler afterwards.

## 2015-04-29 NOTE — Telephone Encounter (Signed)
per pof to sch pt appt-gave pt copy of avs °

## 2015-05-08 NOTE — Progress Notes (Signed)
Counseling Intern Note:  Met with Pt for second counseling session. Pt presented as well groomed and oriented x4 with appropriate affect and good eye contact.  Pt speech was tangential but fluid and coherent. Pt's mobility appears to be improving and pt reported noticeable improvements in her post op recovery.  Pt reported decreased sense of hopelessness and increased ability to cope with difficult emotions. Pt and counselor discusses family and social issues and collaborated possible ways to increase social support.  Pt and counselor explored emotional state related to grief and isolation. Pt denied current SI and contracted for safety.    FU apt scheduled for 2/24 at 11:30am.   Vaughan Sine [phone]

## 2015-05-12 ENCOUNTER — Telehealth: Payer: Self-pay | Admitting: Hematology

## 2015-05-12 NOTE — Telephone Encounter (Signed)
Due to YF out moved 2/22 lab/fu to 3/21. Spoke with patient she is aware.

## 2015-05-13 ENCOUNTER — Ambulatory Visit: Payer: Medicare Other | Admitting: Hematology

## 2015-05-13 ENCOUNTER — Other Ambulatory Visit: Payer: Medicare Other

## 2015-05-15 NOTE — Progress Notes (Signed)
Counseling Intern Note: Met with Pt for 3rd counseling session. Explored spiritual beliefs and world view.  Discussed interpersonal dynamics and the need for increased social interaction.  Pt states that she finds it very difficult to communicate her needs and is struggling to adjust to cultural differences in the [place].  Pt denied current SI but reported that she experiences SI when she has an intense pain episode and cannot engage in her normal coping strategies (being active, becoming immersed in a project). Pt was advised to call 911 if her pain becomes so intense that she is considering harming herself and was reminded of the emergency hotline number.  Pt's reported medical concerns are pain, fear of becoming dependent on pain medication, constipation, and fear of bone fractures (she reports that her rib was broken last week by her dog cuddling with her).  Pt stated that she struggles to communicate all of her concerns to her Doctors. Counselor collaborated with Pt on plan to communicate her concerns to her PCP.    FU session scheduled for 05/20/15  Vaughan Sine Counseling Intern [phone]

## 2015-05-20 NOTE — Progress Notes (Signed)
Counseling Intern Note:  Pt called called to cancel counseling session scheduled for today due to medical appointment conflicts.  Will call Pt on Friday to reschedule for next session.   -Vaughan Sine Counseling Intern [phone]

## 2015-05-25 ENCOUNTER — Telehealth: Payer: Self-pay

## 2015-05-25 NOTE — Telephone Encounter (Signed)
Pt. called to inform Dr. Oneida Alar that she has not started taking Plavix which had been prescribed last September.  Stated she was diagnosed with Liver Cancer, and had a liver resection on 03/04/15.  She was advised not to take any blood thinners surrounding her surgery.  Has a follow-up appt. on 06/11/15 with Dr. Oneida Alar, and questions about keeping that appt.  Reported no discoloration of left fifth toe at this time.  Denied any other occurrence of discoloration of digits.  Reported she has been taking ASA 81 mg. daily x 1 month.  Requesting to check with Dr. Oneida Alar re: keeping next appt.

## 2015-05-27 NOTE — Progress Notes (Signed)
Counseling Intern Note: Met with pt for 4th counseling session.  Pt presented as well groomed and oriented x4 with anxious mood and flight of thought. Pt reports distress regarding not knowing the source of her pain and feels unclear about her current state of health.  Pt has many questions regarding her health, including: - the source of her pain in her abdomen and hip/lumbar area (whether is has to do with her cancer dx, post op pain, bone fractures, etc).  - concern around becoming addicted to her pain medications.  - Who she should talk to about possible antidepressant medication - Safe levels of activities and exercise for her (due to easily fracturing)  Pt stated that she feels very overwhelmed in navigating the medical system. Pt expressed a desire to understand what is happening in her body and what her source of pain is. Pt also stated that she is unclear about the current status of her cancer dx (whether she still has cancer or not).   Counseling intern provided a supportive environment for client emotional processing.  Collaborated with client in compiling list of medical questions for her doctors and goals around communicating concerns.     FU: Will discuss client needs with support center social workers on 3/10. Will call client on 3/15 to schedule next counseling session.   Vaughan Sine [phone]

## 2015-05-28 NOTE — Telephone Encounter (Signed)
Rec'd recommendation from Dr. Oneida Alar for pt. to continue to take ASA.  Pt. notified of this, and reminded of her f/u appt. 3/23; verb. understanding.

## 2015-06-03 ENCOUNTER — Encounter: Payer: Self-pay | Admitting: Family

## 2015-06-09 ENCOUNTER — Encounter: Payer: Self-pay | Admitting: Hematology

## 2015-06-09 ENCOUNTER — Telehealth: Payer: Self-pay | Admitting: Hematology

## 2015-06-09 ENCOUNTER — Ambulatory Visit (HOSPITAL_BASED_OUTPATIENT_CLINIC_OR_DEPARTMENT_OTHER): Payer: Medicare Other | Admitting: Hematology

## 2015-06-09 ENCOUNTER — Other Ambulatory Visit (HOSPITAL_BASED_OUTPATIENT_CLINIC_OR_DEPARTMENT_OTHER): Payer: Medicare Other

## 2015-06-09 VITALS — BP 143/57 | HR 83 | Temp 98.2°F | Resp 17 | Ht 66.0 in | Wt 109.7 lb

## 2015-06-09 DIAGNOSIS — Z720 Tobacco use: Secondary | ICD-10-CM

## 2015-06-09 DIAGNOSIS — C220 Liver cell carcinoma: Secondary | ICD-10-CM | POA: Diagnosis not present

## 2015-06-09 DIAGNOSIS — J449 Chronic obstructive pulmonary disease, unspecified: Secondary | ICD-10-CM

## 2015-06-09 LAB — CBC WITH DIFFERENTIAL/PLATELET
BASO%: 0.7 % (ref 0.0–2.0)
Basophils Absolute: 0.1 10*3/uL (ref 0.0–0.1)
EOS%: 1.2 % (ref 0.0–7.0)
Eosinophils Absolute: 0.1 10*3/uL (ref 0.0–0.5)
HCT: 42.6 % (ref 34.8–46.6)
HGB: 13.8 g/dL (ref 11.6–15.9)
LYMPH%: 33.1 % (ref 14.0–49.7)
MCH: 28.3 pg (ref 25.1–34.0)
MCHC: 32.4 g/dL (ref 31.5–36.0)
MCV: 87.5 fL (ref 79.5–101.0)
MONO#: 0.7 10*3/uL (ref 0.1–0.9)
MONO%: 7.8 % (ref 0.0–14.0)
NEUT#: 5.1 10*3/uL (ref 1.5–6.5)
NEUT%: 57.2 % (ref 38.4–76.8)
Platelets: 272 10*3/uL (ref 145–400)
RBC: 4.87 10*6/uL (ref 3.70–5.45)
RDW: 14.4 % (ref 11.2–14.5)
WBC: 9 10*3/uL (ref 3.9–10.3)
lymph#: 3 10*3/uL (ref 0.9–3.3)

## 2015-06-09 LAB — COMPREHENSIVE METABOLIC PANEL
ALT: 26 U/L (ref 0–55)
AST: 34 U/L (ref 5–34)
Albumin: 3.7 g/dL (ref 3.5–5.0)
Alkaline Phosphatase: 251 U/L — ABNORMAL HIGH (ref 40–150)
Anion Gap: 8 mEq/L (ref 3–11)
BUN: 13.7 mg/dL (ref 7.0–26.0)
CO2: 28 mEq/L (ref 22–29)
Calcium: 9.6 mg/dL (ref 8.4–10.4)
Chloride: 102 mEq/L (ref 98–109)
Creatinine: 0.7 mg/dL (ref 0.6–1.1)
EGFR: 89 mL/min/{1.73_m2} — ABNORMAL LOW (ref >90)
Glucose: 88 mg/dl (ref 70–140)
Potassium: 4.4 mEq/L (ref 3.5–5.1)
Sodium: 138 mEq/L (ref 136–145)
Total Bilirubin: 0.43 mg/dL (ref 0.20–1.20)
Total Protein: 7.7 g/dL (ref 6.4–8.3)

## 2015-06-09 NOTE — Progress Notes (Signed)
Tina Bailey  [phone] [phone]  Clinic follow Up Note   Patient Care Team: Orpah Melter, MD as PCP - General (Family Medicine) Elam Dutch, MD as Consulting Physician (Vascular Surgery) 06/09/2015   CHIEF COMPLAINTS:  Hepatocellular carcinoma  Oncology History   Hepatocellular carcinoma ([place])   Staging form: Liver (Excluding Intrahepatic Bile Ducts), AJCC 7th Edition     Clinical stage from 12/30/2014: Stage Unknown (T1, NX, M0) - Unsigned       Hepatocellular carcinoma ([place])   12/02/2014 Imaging Liver MRI showed a large lesion in segment 5 and 40, highly suspicious for malignancy. And a 1.2 cm indeterminate pancreatic body lesion. Mildly enlarged portocaval lymph node. Cholelithiasis.   12/19/2014 Initial Diagnosis Hepatocellular carcinoma ([place])   12/19/2014 Initial Biopsy Liver mass biopsy showed parathyroid carcinoma, positive for hep par 1, cytokeratin 8/18, cytokeratin 7 and MOC-31.   12/25/2014 Imaging CT chest showed a 3 mm groundglass probably nodule in the right upper lobe, mild right pericardial phrenic lymphadenopathy, no definitive evidence of metastasis.   01/13/2015 Procedure Liquid embolization of the right portal venous system by Interventional Radiologist Dr. Laurence Ferrari   03/04/2015 Surgery Liver right lobe hepatectomy, and the portal lymph nodes biopsy   03/04/2015 Surgery Hepatocellular carcinoma, poorly differentiated, spanning 8.5 cm, surgical margins were negative, lymphovascular invasion is identified, for portal lymph nodes were negative.   HISTORY OF PRESENTING ILLNESS:  Tina Bailey 70 y.o. female is here because of recently discovered liver mass.  She has had abdominal pain after meals for the past 4 years, occurs once every several months, usually resolves after 20-30 mins on its own. She was seen by her PCP, had [place] and her symptoms was felt to be related to her gall bladder stone.   She has had some  pressure feeling under right ribcage, which radiates to back, for the past 8-9 months, and slight decrease her appetite, and fatigue, she lost about 10 lbs.   She went to see Dr. Oneida Alar for left 5th toes color change (blue), no claudication, she underwent CT angiogram which incidentally showed a large liver mass and a 1.2 cm pancreatic cystic lesion. She was referred to [place] for further management.   She never had colonoscopy. She denies melena, and she is here, change of her bowel habit. Se lives with her daughter. She has extensive smoking history, no family history of malignancy.  CURRENT THERAPY: Surveillance   INTERIM HISTROY Syenna returns for follow-up. She underwent right lobe hepatectomy by Dr. Barry Dienes in December 2016. She tolerated surgery very well, and has recovered very well. She complains about low back pain for the past few months, was seen by her primary care physician, had x-ray which was negative. She had a previous history spine fracture in 2015. In every 2017, she was found to have left fifth rib fracture, without trauma or injury. She has osteoporosis. She has mild fatigue, able to function well at home. During the visit, she expressed her uncertainty and anxiety about "am I cancer free?".   MEDICAL HISTORY:  Past Medical History  Diagnosis Date  . COPD (chronic obstructive pulmonary disease) ([place])   . Productive cough   . Wears glasses   . Fibromyalgia   . Retina disorder     L eye, vision distorted, edema  . Macular degeneration     R eye  . Blue toe syndrome ([place]) 11/27/2014  . Osteoporosis   . Spine fracture due to birth trauma   .  Cancer ([place]) 12/2014    liver cancer  . Complication of anesthesia   . PONV (postoperative nausea and vomiting)     nausea,severe vomiting after 01-13-15 portal vein embolization  . Arthritis     DDD. Right shoulder"is frozen"-limited ROM.  Marland Kitchen TMJ disease     SURGICAL HISTORY: Past Surgical History  Procedure Laterality Date  .  Partial hysterectomy    . Kidney stone surgery    . Angioplasty  2008    no stents required, no follow-up with cardiologist, no recurrent chest pain  . Cataract extraction, bilateral Bilateral   . Portal vein embolization      01-13-15 -Dr. Cathlean Sauer.  . Eye surgery      cornea surgery  . Laparoscopic partial hepatectomy N/A 03/04/2015    Procedure: DIAGNOSTIC LAPAROSCOPY, EXTENDED RIGHT HEPATECTOMY, WITH INTRAOPERATIVE ULTRASOUND;  Surgeon: Stark Klein, MD;  Location: [facility];  Service: General;  Laterality: N/A;    SOCIAL HISTORY: Social History   Social History  . Marital Status: Divorced    Spouse Name: N/A  . Number of Children: 3   . Years of Education: N/A   Occupational History  . 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 Smoking status: Current Every Day Smoker -- 1.00 packs/day for 45 years    Types: Cigarettes  . Smokeless tobacco: Never Used  . Alcohol Use: No  . Drug Use: No  . Sexual Activity: Not on file   Other Topics Concern  . Not on file   Social History Narrative    FAMILY HISTORY: Family History  Problem Relation Age of Onset  . Hypertension Mother   . Atrial fibrillation Mother   . Heart disease Mother     after age 36  . Stroke Father   . Dementia Father   . Heart disease Brother     After age 69- A-Fib  . Heart attack Brother     ALLERGIES:  is allergic to amoxicillin-pot clavulanate; paroxetine hcl; and sulfa antibiotics.  MEDICATIONS:  Current Outpatient Prescriptions  Medication Sig Dispense Refill  . carboxymethylcellulose (REFRESH TEARS) 0.5 % SOLN Place 1-2 drops into both eyes 3 (three) times daily as needed (for dry eyes).     Marland Kitchen HYDROcodone-acetaminophen (NORCO/VICODIN) 5-325 MG tablet Take 1 tablet by mouth every 4 (four) hours as needed for moderate pain or severe pain. 30 tablet 0  . ibuprofen (ADVIL,MOTRIN) 200 MG tablet Take 200 mg by mouth every 6 (six) hours as needed for  mild pain.     . Multiple Vitamin (MULTIVITAMIN WITH MINERALS) TABS tablet Take 1 tablet by mouth daily.    . sodium chloride 1 G tablet Take 1 tablet (1 g total) by mouth 2 (two) times daily with a meal. 14 tablet 0   No current facility-administered medications for this visit.    REVIEW OF SYSTEMS:   Constitutional: Denies fevers, chills or abnormal night sweats, (+) fatigue and 10 lb weight loss  Eyes: Denies blurriness of vision, double vision or watery eyes Ears, nose, mouth, throat, and face: Denies mucositis or sore throat Respiratory: Denies cough, dyspnea or wheezes Cardiovascular: Denies palpitation, chest discomfort or lower extremity swelling Gastrointestinal:  Denies nausea, heartburn or change in bowel habits, se HPI  Skin: Denies abnormal skin rashes Lymphatics: Denies new lymphadenopathy or easy bruising Neurological:Denies numbness, tingling or new weaknesses Behavioral/Psych: Mood is stable, no new changes  All other systems were reviewed with the  patient and are negative.  PHYSICAL EXAMINATION: ECOG PERFORMANCE STATUS: 1 - Symptomatic but completely ambulatory  Filed Vitals:   06/09/15 1304  BP: 143/57  Pulse: 83  Temp: 98.2 F (36.8 C)  Resp: 17   Filed Weights   06/09/15 1304  Weight: 109 lb 11.2 oz (49.76 kg)    GENERAL:alert, no distress and comfortable SKIN: skin color, texture, turgor are normal, no rashes or significant lesions EYES: normal, conjunctiva are pink and non-injected, sclera clear OROPHARYNX:no exudate, no erythema and lips, buccal mucosa, and tongue normal  NECK: supple, thyroid normal size, non-tender, without nodularity LYMPH:  no palpable lymphadenopathy in the cervical, axillary or inguinal LUNGS: clear to auscultation and percussion with normal breathing effort HEART: regular rate & rhythm and no murmurs and no lower extremity edema ABDOMEN:abdomen soft, non-tender and normal bowel sounds Musculoskeletal:no cyanosis of digits  and no clubbing  PSYCH: alert & oriented x 3 with fluent speech NEURO: no focal motor/sensory deficits  LABORATORY DATA:  I have reviewed the data as listed CBC Latest Ref Rng 06/10/2015 06/09/2015 03/11/2015  WBC 4.0 - 10.5 K/uL 9.2 9.0 11.2(H)  Hemoglobin 12.0 - 15.0 g/dL 13.2 13.8 8.8(L)  Hematocrit 36.0 - 46.0 % 39.1 42.6 25.6(L)  Platelets 150 - 400 K/uL 248 272 234    CMP Latest Ref Rng 06/10/2015 06/09/2015 03/13/2015  Glucose 65 - 99 mg/dL 143(H) 88 85  BUN 6 - 20 mg/dL 13 13.7 11  Creatinine 0.44 - 1.00 mg/dL 0.52 0.7 0.39(L)  Sodium 135 - 145 mmol/L 136 138 129(L)  Potassium 3.5 - 5.1 mmol/L 4.2 4.4 3.6  Chloride 101 - 111 mmol/L 99(L) - 95(L)  CO2 22 - 32 mmol/L 29 28 27   Calcium 8.9 - 10.3 mg/dL 9.3 9.6 8.4(L)  Total Protein 6.5 - 8.1 g/dL 7.5 7.7 -  Total Bilirubin 0.3 - 1.2 mg/dL 1.0 0.43 -  Alkaline Phos 38 - 126 U/L 225(H) 251(H) -  AST 15 - 41 U/L 34 34 -  ALT 14 - 54 U/L 28 26 -   AFP tumor marker  Status: Finalresult Visible to patient:  Not Released Nextappt: 09/07/2015 at 11:30 AM in [facility] 4) Dx:  Hepatocellular carcinoma ([place])         Ref Range 5d ago    AFP, Serum, Tumor Marker 0.0 - 8.3 ng/mL 4.1         PATHOLOGY REPORT  Diagnosis 03/04/2015 1. Liver, biopsy, left sided nodule - BENIGN HEPATIC PARENCHYMA. - THERE IS NO EVIDENCE OF MALIGNANCY. 2. Liver, biopsy, posterior sided nodule - BENIGN HEPATIC PARENCHYMA WITH ASSOCIATED FIBROUS TISSUE. - THERE IS NO EVIDENCE OF MALIGNANCY. 3. Lymph node, biopsy, portal - THERE IS NO EVIDENCE OF CARCINOMA IN 3 OF 3 LYMPH NODES (0/3). 4. Lymph node, biopsy, posterior portal - THERE IS NO EVIDENCE OF CARCINOMA IN 1 OF 1 LYMPH NODE (0/1). 5. Liver, hepatectomy, right lobe - HEPATOCELLULAR CARCINOMA, POORLY DIFFERENTIATED, SPANNING 8.5 CM. - THE SURGICAL RESECTION MARGINS ARE NEGATIVE FOR CARCINOMA. - LYMPHOVASCULAR INVASION IS IDENTIFIED. - SEE ONCOLOGY TABLE  BELOW. OTHER FINDINGS: - BENIGN HEPATIC PARENCHYMA WITH MILD PORTAL CHRONIC INFLAMMATION AND DILATED VASCULATURE. - GALLBLADDER WITH MILD CHRONIC CHOLECYSTITIS AND CHOLELITHIASIS.  RADIOGRAPHIC STUDIES: I have personally reviewed the radiological images as listed and agreed with the findings in the report.  Mr Liver W Wo Contrast 12/02/2014   IMPRESSION: 1. The first lesion of concern is a fairly vascular mass spanning segments 5 and 4B of the liver with capsule  or pseudocapsule appearance, and central enhancement which spreads peripherally on the later images, and also with associated hepatic capsular retraction. Differential diagnostic considerations for this lesion include peripheral cholangiocarcinoma ; hepatic epitheliod hemangioendothelioma ; hepatocellular carcinoma ; or treated metastatic disease. Biopsy is likely warranted. 2. The second lesion of concern is a 1.2 cm pancreatic body lesion with high T2 and low T1 signal characteristics, questionable attachment to the dorsal pancreatic duct, and some internal septation and probably some faint nodular enhancement internally. The appearance on CT, for example image 62 series 9, is somewhat more concerning for internal enhancement, although measurable enhancement is present on MRI. This could be a small macro cystic tumor with solid component; the primary ductal pancreatic adenocarcinoma with cystic degeneration; were cystic degeneration of an islet cell tumor such as insulinoma or glucagon adenoma. Metastatic disease to the pancreas could possibly appear this way. Intraductal papillary mucinous neoplasm with small solid component could also appear this way. Biopsy, surveillance, or further imaging characterization with nuclear medicine PET-CT may be warranted. 3. Cholelithiasis. 4. Mild biliary dilatation. 5. Mildly enlarged portacaval lymph node. In the setting of the hepatic and pancreatic lesions the appearance raises concern for the possibility of  early local metastatic disease. This could also be further characterized at PET-CT.   Electronically Signed   By: Van Clines M.D.   On: 12/02/2014 08:39   CT chest w contrast 12/25/2014 IMPRESSION: 1. Solitary 3 mm ground-glass pulmonary nodule in the right upper lobe. Recommend attention on follow-up chest CT in 3 months. 2. Mild right pericardiophrenic lymphadenopathy, possibly metastatic. 3. Mild centrilobular emphysema. 4. Nondisplaced healing subacute lateral left fifth rib fracture. 5. Right thyroid lobe 1.8 cm nodule. Recommend correlation with thyroid ultrasound. This follows ACR consensus guidelines: Managing Incidental Thyroid Nodules Detected on Imaging: White Paper of the ACR Incidental Thyroid Findings Committee. J Am Coll Radiol 2015; 12:143-150   ASSESSMENT & PLAN: 70 year old African-American female, with 45-pack-year smoking history, presented with incidental scan finding of a large peripheral liver lesion in the right lobe segmental 5 and a 4B, and a small pancreatic cystic lesion.  1. Hepatocellular carcinoma, pT3BN0M0, stage IIIB, poorly differentiated  -I reviewed her surgical pathology findings with her in details. Her surgical margins were negative, she had a complete resection -No role for adjuvant therapy is needed.  -We discussed the risk of cancer recurrence. Giving the locally advanced stage, she is certainly at high risk for recurrence, especially local recurrence. -We discussed the surveillance for cancer recurrence after her surgery. I'll see her every 3-4 months with lab including AFP, and repeated surveillance CT scan every 6-12 months, for total 5 years   2. Pancreatic lesion -Abdominal MRI showed a 1.2 cm pancreatic body lesion, likely cystic with some solid component.  -Her imaging was reviewed in our tumor before, radiologist feels they pancreatic lesion is less likely malignancy. -We'll follow up clinically.  3. COPD and smoking cessation   -She will continue to follow-up with her primary care physician -I discussed the smoking cessation extensively. She is willing to cut back, not ready for crit completely yet.  4. Back pain -she will continue follow up with her PCP   Plan -I'll see her back in 3 months for follow-up, with lab and liver MRI one week before   All questions were answered. The patient knows to call the clinic with any problems, questions or concerns. I spent 25 minutes counseling the patient face to face. The total time spent in the appointment  was 30 minutes and more than 50% was on counseling.     Truitt Merle, MD 06/09/2015 8:19 AM

## 2015-06-09 NOTE — Telephone Encounter (Signed)
per pof to sch pt appt-gave pt copy of avs-adv [facility] will call to sch scan °

## 2015-06-10 ENCOUNTER — Emergency Department (HOSPITAL_COMMUNITY)
Admission: EM | Admit: 2015-06-10 | Discharge: 2015-06-10 | Disposition: A | Discharge status: Home / Self Care | Payer: Medicare Other | Attending: Emergency Medicine | Admitting: Emergency Medicine

## 2015-06-10 ENCOUNTER — Emergency Department (HOSPITAL_COMMUNITY): Payer: Medicare Other

## 2015-06-10 ENCOUNTER — Encounter (HOSPITAL_COMMUNITY): Payer: Self-pay | Admitting: Emergency Medicine

## 2015-06-10 DIAGNOSIS — Z8679 Personal history of other diseases of the circulatory system: Secondary | ICD-10-CM | POA: Insufficient documentation

## 2015-06-10 DIAGNOSIS — Z880 Allergy status to penicillin: Secondary | ICD-10-CM | POA: Insufficient documentation

## 2015-06-10 DIAGNOSIS — Z8781 Personal history of (healed) traumatic fracture: Secondary | ICD-10-CM | POA: Diagnosis not present

## 2015-06-10 DIAGNOSIS — Z8505 Personal history of malignant neoplasm of liver: Secondary | ICD-10-CM | POA: Diagnosis not present

## 2015-06-10 DIAGNOSIS — J449 Chronic obstructive pulmonary disease, unspecified: Secondary | ICD-10-CM | POA: Diagnosis not present

## 2015-06-10 DIAGNOSIS — M1990 Unspecified osteoarthritis, unspecified site: Secondary | ICD-10-CM | POA: Diagnosis not present

## 2015-06-10 DIAGNOSIS — Z8669 Personal history of other diseases of the nervous system and sense organs: Secondary | ICD-10-CM | POA: Insufficient documentation

## 2015-06-10 DIAGNOSIS — F17210 Nicotine dependence, cigarettes, uncomplicated: Secondary | ICD-10-CM | POA: Insufficient documentation

## 2015-06-10 DIAGNOSIS — M545 Low back pain, unspecified: Secondary | ICD-10-CM

## 2015-06-10 DIAGNOSIS — Z973 Presence of spectacles and contact lenses: Secondary | ICD-10-CM | POA: Diagnosis not present

## 2015-06-10 DIAGNOSIS — Z7982 Long term (current) use of aspirin: Secondary | ICD-10-CM | POA: Diagnosis not present

## 2015-06-10 DIAGNOSIS — Z79899 Other long term (current) drug therapy: Secondary | ICD-10-CM | POA: Diagnosis not present

## 2015-06-10 DIAGNOSIS — Z9861 Coronary angioplasty status: Secondary | ICD-10-CM | POA: Diagnosis not present

## 2015-06-10 LAB — COMPREHENSIVE METABOLIC PANEL
ALT: 28 U/L (ref 14–54)
AST: 34 U/L (ref 15–41)
Albumin: 3.9 g/dL (ref 3.5–5.0)
Alkaline Phosphatase: 225 U/L — ABNORMAL HIGH (ref 38–126)
Anion gap: 8 (ref 5–15)
BUN: 13 mg/dL (ref 6–20)
CO2: 29 mmol/L (ref 22–32)
Calcium: 9.3 mg/dL (ref 8.9–10.3)
Chloride: 99 mmol/L — ABNORMAL LOW (ref 101–111)
Creatinine, Ser: 0.52 mg/dL (ref 0.44–1.00)
GFR calc Af Amer: >60 mL/min (ref >60)
GFR calc non Af Amer: >60 mL/min (ref >60)
Glucose, Bld: 143 mg/dL — ABNORMAL HIGH (ref 65–99)
Potassium: 4.2 mmol/L (ref 3.5–5.1)
Sodium: 136 mmol/L (ref 135–145)
Total Bilirubin: 1 mg/dL (ref 0.3–1.2)
Total Protein: 7.5 g/dL (ref 6.5–8.1)

## 2015-06-10 LAB — CBC WITH DIFFERENTIAL/PLATELET
Basophils Absolute: 0 10*3/uL (ref 0.0–0.1)
Basophils Relative: 0 %
Eosinophils Absolute: 0 10*3/uL (ref 0.0–0.7)
Eosinophils Relative: 0 %
HCT: 39.1 % (ref 36.0–46.0)
Hemoglobin: 13.2 g/dL (ref 12.0–15.0)
Lymphocytes Relative: 18 %
Lymphs Abs: 1.7 10*3/uL (ref 0.7–4.0)
MCH: 28.7 pg (ref 26.0–34.0)
MCHC: 33.8 g/dL (ref 30.0–36.0)
MCV: 85 fL (ref 78.0–100.0)
Monocytes Absolute: 0.4 10*3/uL (ref 0.1–1.0)
Monocytes Relative: 4 %
Neutro Abs: 7.1 10*3/uL (ref 1.7–7.7)
Neutrophils Relative %: 78 %
Platelets: 248 10*3/uL (ref 150–400)
RBC: 4.6 MIL/uL (ref 3.87–5.11)
RDW: 14.2 % (ref 11.5–15.5)
WBC: 9.2 10*3/uL (ref 4.0–10.5)

## 2015-06-10 LAB — URINALYSIS, ROUTINE W REFLEX MICROSCOPIC
Bilirubin Urine: NEGATIVE
Glucose, UA: NEGATIVE mg/dL
Hgb urine dipstick: NEGATIVE
Ketones, ur: NEGATIVE mg/dL
Leukocytes, UA: NEGATIVE
Nitrite: NEGATIVE
Protein, ur: NEGATIVE mg/dL
Specific Gravity, Urine: 1.012 (ref 1.005–1.030)
pH: 7 (ref 5.0–8.0)

## 2015-06-10 LAB — AFP TUMOR MARKER: AFP, Serum, Tumor Marker: 4.1 ng/mL (ref 0.0–8.3)

## 2015-06-10 LAB — ALPHA FETO PROTEIN (PARALLEL TESTING): AFP-Tumor Marker: 3.6 ng/mL (ref <6.1)

## 2015-06-10 LAB — PROTIME-INR
INR: 1.12 (ref 0.00–1.49)
Prothrombin Time: 14.2 seconds (ref 11.6–15.2)

## 2015-06-10 MED ORDER — SODIUM CHLORIDE 0.9 % IV SOLN
INTRAVENOUS | Status: DC
  Administered 2015-06-10: 20:00:00 via INTRAVENOUS

## 2015-06-10 MED ORDER — MORPHINE SULFATE (PF) 4 MG/ML IV SOLN
4.0000 mg | Freq: Once | INTRAVENOUS | Status: AC
Start: 2015-06-10 — End: 2015-06-10
  Administered 2015-06-10: 4 mg via INTRAVENOUS
  Filled 2015-06-10: qty 1

## 2015-06-10 MED ORDER — NAPROXEN 375 MG PO TABS: 375.0000 mg | ORAL_TABLET | Freq: Two times a day (BID) | ORAL | Status: DC

## 2015-06-10 MED ORDER — ORPHENADRINE CITRATE ER 100 MG PO TB12: 100.0000 mg | ORAL_TABLET | Freq: Two times a day (BID) | ORAL | Status: DC

## 2015-06-10 MED ORDER — IBUPROFEN 200 MG PO TABS
400.0000 mg | ORAL_TABLET | Freq: Once | ORAL | Status: AC
  Administered 2015-06-10: 400 mg via ORAL
  Filled 2015-06-10: qty 2

## 2015-06-10 MED ORDER — IOHEXOL 300 MG/ML  SOLN
25.0000 mL | Freq: Once | INTRAMUSCULAR | Status: AC | PRN
  Administered 2015-06-10: 25 mL via ORAL

## 2015-06-10 MED ORDER — IOPAMIDOL (ISOVUE-300) INJECTION 61%
100.0000 mL | Freq: Once | INTRAVENOUS | Status: AC | PRN
  Administered 2015-06-10: 100 mL via INTRAVENOUS

## 2015-06-10 NOTE — Discharge Instructions (Signed)

## 2015-06-10 NOTE — ED Notes (Signed)
Per pt, states right flank pain-stated this am;

## 2015-06-10 NOTE — ED Provider Notes (Signed)
CSN: [id number]     Arrival date & time 06/10/15  1657 History   First MD Initiated Contact with Patient 06/10/15 1900     Chief Complaint  Patient presents with  . Flank Pain     (Consider location/radiation/quality/duration/timing/severity/associated sxs/prior Treatment) HPI Patient reports that she had sudden onset of central lower back pain while driving this afternoon. She reports she reached to place something in the container at the bank. She reports she did not twist or move much. She states since that time she's had a severe pain that is much worse with certain movements and deep breath or cough. No associated abdominal pain. No associated nausea vomiting or diarrhea. No recent constipation. Patient does have history of hepatic cancer with successful resection. She is also had problems with several recurrent compression fractures. Past Medical History  Diagnosis Date  . COPD (chronic obstructive pulmonary disease) ([place])   . Productive cough   . Wears glasses   . Fibromyalgia   . Retina disorder     L eye, vision distorted, edema  . Macular degeneration     R eye  . Blue toe syndrome ([place]) 11/27/2014  . Osteoporosis   . Spine fracture due to birth trauma   . Cancer ([place]) 12/2014    liver cancer  . Complication of anesthesia   . PONV (postoperative nausea and vomiting)     nausea,severe vomiting after 01-13-15 portal vein embolization  . Arthritis     DDD. Right shoulder"is frozen"-limited ROM.  Marland Kitchen TMJ disease    Past Surgical History  Procedure Laterality Date  . Partial hysterectomy    . Kidney stone surgery    . Angioplasty  2008    no stents required, no follow-up with cardiologist, no recurrent chest pain  . Cataract extraction, bilateral Bilateral   . Portal vein embolization      01-13-15 -Dr. Cathlean Sauer.  . Eye surgery      cornea surgery  . Laparoscopic partial hepatectomy N/A 03/04/2015    Procedure: DIAGNOSTIC LAPAROSCOPY, EXTENDED RIGHT  HEPATECTOMY, WITH INTRAOPERATIVE ULTRASOUND;  Surgeon: Stark Klein, MD;  Location: [facility];  Service: General;  Laterality: N/A;   Family History  Problem Relation Age of Onset  . Hypertension Mother   . Atrial fibrillation Mother   . Heart disease Mother     after age 19  . Stroke Father   . Dementia Father   . Heart disease Brother     After age 41- A-Fib  . Heart attack Brother    Social History  Substance Use Topics  . Smoking status: Current Every Day Smoker -- 1.00 packs/day for 45 years    Types: Cigarettes  . Smokeless tobacco: Never Used  . Alcohol Use: No   OB History    No data available     Review of Systems  10 Systems reviewed and are negative for acute change except as noted in the HPI.   Allergies  Amoxicillin-pot clavulanate; Paroxetine hcl; and Sulfa antibiotics  Home Medications   Prior to Admission medications   Medication Sig Start Date End Date Taking? Authorizing Provider  aspirin EC 81 MG tablet Take 81 mg by mouth daily.   Yes Historical Provider, MD  carboxymethylcellulose (REFRESH TEARS) 0.5 % SOLN Place 1-2 drops into both eyes 3 (three) times daily as needed (for dry eyes).    Yes Historical Provider, MD  HYDROcodone-acetaminophen (NORCO/VICODIN) 5-325 MG tablet Take 1 tablet by mouth every 4 (four) hours as needed  for moderate pain or severe pain. Patient taking differently: Take 1 tablet by mouth every 8 (eight) hours as needed for moderate pain or severe pain.  [id number]  Yes Leighton Ruff, MD  ibuprofen (ADVIL,MOTRIN) 200 MG tablet Take 200 mg by mouth every 6 (six) hours as needed for mild pain.    Yes Historical Provider, MD  Multiple Vitamin (MULTIVITAMIN WITH MINERALS) TABS tablet Take 1 tablet by mouth daily.   Yes Historical Provider, MD  naproxen (NAPROSYN) 375 MG tablet Take 1 tablet (375 mg total) by mouth 2 (two) times daily. 06/10/15   Charlesetta Shanks, MD  orphenadrine (NORFLEX) 100 MG tablet Take 1 tablet (100 mg total) by mouth 2  (two) times daily. 06/10/15   Charlesetta Shanks, MD   BP 148/71 mmHg  Pulse 64  Temp(Src) 97.7 F (36.5 C) (Oral)  Resp 18  SpO2 100% Physical Exam  Constitutional: She is oriented to person, place, and time. She appears well-developed and well-nourished.  HENT:  Head: Normocephalic and atraumatic.  Eyes: EOM are normal. Pupils are equal, round, and reactive to light.  Neck: Neck supple.  Cardiovascular: Normal rate, regular rhythm, normal heart sounds and intact distal pulses.   Pulmonary/Chest: Effort normal and breath sounds normal.  Abdominal: Soft. Bowel sounds are normal. She exhibits no distension. There is no tenderness.  Musculoskeletal: Normal range of motion. She exhibits tenderness. She exhibits no edema.  Reproducible tenderness of approximately L3-4. Patient reports the pain radiates downwards towards her lower back. This is reproduced by certain position changes. No palpable abnormality.  Neurological: She is alert and oriented to person, place, and time. She has normal strength. Coordination normal. GCS eye subscore is 4. GCS verbal subscore is 5. GCS motor subscore is 6.  Skin: Skin is warm, dry and intact.  Psychiatric: She has a normal mood and affect.    ED Course  Procedures (including critical care time) Labs Review Labs Reviewed  COMPREHENSIVE METABOLIC PANEL - Abnormal; Notable for the following:    Chloride 99 (*)    Glucose, Bld 143 (*)    Alkaline Phosphatase 225 (*)    All other components within normal limits  URINALYSIS, ROUTINE W REFLEX MICROSCOPIC (NOT AT [facility])  CBC WITH DIFFERENTIAL/PLATELET  PROTIME-INR    Imaging Review Ct Abdomen Pelvis W Contrast  06/10/2015  CLINICAL DATA:  70 year old female with mid to lower back pain radiating from the right to the left. History of liver cancer with partial hepatectomy. EXAM: CT ABDOMEN AND PELVIS WITH CONTRAST TECHNIQUE: Multidetector CT imaging of the abdomen and pelvis was performed using the standard  protocol following bolus administration of intravenous contrast. CONTRAST:  49mL OMNIPAQUE IOHEXOL 300 MG/ML SOLN, 155mL ISOVUE-300 IOPAMIDOL (ISOVUE-300) INJECTION 61% COMPARISON:  CT dated 12/01/2014 and MRI dated 12/02/2014 FINDINGS: The visualized lung bases are clear. No intra-abdominal free air or free fluid. There is postsurgical changes of partial hepatectomy. Multiple small calcific nodules noted within the liver, likely sequela of prior inflammation. No suspicious hepatic lesions identified. Cholecystectomy. A biliary stent is noted caps extending from the intrahepatic biliary into the duodenum. No biliary ductal dilatation identified. There is a 1.1 x 1.3 cm hypodense lesion in the proximal body of the pancreas. This was seen the prior MRI and measures approximately 1.2 cm. Continued follow-up recommended. The spleen, adrenal appear unremarkable. Subcentimeter left renal hypodense lesion is not well characterized but may represent a cyst. There is no hydronephrosis on the left. There is a mild right hydronephrosis which is grossly  similar or minimally increased compared to the prior study. Right extrarenal pelvis is noted. The visualized ureters and urinary bladder appear grossly unremarkable. Hysterectomy. There is sigmoid diverticulosis without active inflammatory changes. Moderate stool noted throughout the colon. There is no evidence of bowel obstruction or active inflammation. Normal appendix. Mild aortoiliac atherosclerotic disease. The IVC appears unremarkable. No portal venous gas identified. There is no adenopathy. Midline vertical anterior abdominal wall incisional scar. There is osteopenia with degenerative changes of the spine. No acute fracture. Multilevel lower lumbar compression deformity most prominent at L3 and L4 and appeared chronic. IMPRESSION: No acute intra-abdominal or pelvic pathology. Postsurgical changes of partial hepatectomy. No suspicious hepatic lesions identified. The  biliary stent is in place. No intrahepatic biliary ductal dilatation. No significant interval change in the size of the hypoattenuating lesion in the proximal body of the pancreas. Continued follow-up recommended. Mild right hydronephrosis, grossly similar or minimally increased from prior study. Sigmoid diverticulosis. No evidence of bowel obstruction or inflammation. Normal appendix. Multilevel degenerative changes of the spine with chronic appearing compression deformity of the lower lumbar spine. No acute fracture. Electronically Signed   By: Anner Crete M.D.   On: 06/10/2015 22:11   I have personally reviewed and evaluated these images and lab results as part of my medical decision-making.   EKG Interpretation None      MDM   Final diagnoses:  Midline low back pain without sciatica   Patient has history of hepatic cancer. CT did not show any acute intra-abdominal findings. Patient has extensive degenerative disease with some chronic findings of compression fracture but no acute. Given the nature the patient's pain being very reproducible by position changes and radiating down her lower back, I felt this most likely muscle skeletal in nature. She did not have any associated neurologic dysfunction or weakness. No associated GU symptoms. Patient did not wish to take narcotics for pain control. She does have hydrocodone at home if needed. At this time she'll be placed on naproxen and Norflex. A is counseled for close follow-up with her primary care provider.    Charlesetta Shanks, MD 06/11/15 0002

## 2015-06-10 NOTE — ED Notes (Signed)
Pt aware that a urine sample is needed. Pt sts she is unable at this time. 

## 2015-06-11 ENCOUNTER — Ambulatory Visit (HOSPITAL_COMMUNITY)
Admission: RE | Admit: 2015-06-11 | Discharge: 2015-06-11 | Disposition: A | Discharge status: Home / Self Care | Payer: Medicare Other | Source: Ambulatory Visit | Attending: Family | Admitting: Family

## 2015-06-11 ENCOUNTER — Other Ambulatory Visit: Payer: Self-pay | Admitting: *Deleted

## 2015-06-11 ENCOUNTER — Encounter: Payer: Self-pay | Admitting: Family

## 2015-06-11 ENCOUNTER — Ambulatory Visit (INDEPENDENT_AMBULATORY_CARE_PROVIDER_SITE_OTHER): Payer: Medicare Other | Admitting: Family

## 2015-06-11 VITALS — BP 115/67 | HR 74 | Ht 66.0 in | Wt 109.3 lb

## 2015-06-11 DIAGNOSIS — K869 Disease of pancreas, unspecified: Secondary | ICD-10-CM

## 2015-06-11 DIAGNOSIS — R160 Hepatomegaly, not elsewhere classified: Secondary | ICD-10-CM | POA: Diagnosis not present

## 2015-06-11 DIAGNOSIS — I739 Peripheral vascular disease, unspecified: Secondary | ICD-10-CM | POA: Diagnosis not present

## 2015-06-11 DIAGNOSIS — I779 Disorder of arteries and arterioles, unspecified: Secondary | ICD-10-CM

## 2015-06-11 DIAGNOSIS — K8689 Other specified diseases of pancreas: Secondary | ICD-10-CM

## 2015-06-11 DIAGNOSIS — I75022 Atheroembolism of left lower extremity: Secondary | ICD-10-CM | POA: Diagnosis not present

## 2015-06-11 DIAGNOSIS — F17200 Nicotine dependence, unspecified, uncomplicated: Secondary | ICD-10-CM

## 2015-06-11 DIAGNOSIS — Z720 Tobacco use: Secondary | ICD-10-CM

## 2015-06-11 NOTE — Patient Instructions (Signed)

## 2015-06-11 NOTE — Progress Notes (Signed)
[facility] [place] HISTORY AND PHYSICAL -PAD  History of Present Illness Tina Bailey is a 70 y.o. female patient of Dr. Oneida Alar whom he has evaluated for pain and bluish discoloration left fifth toe. The patient began to have pain in her left fifth toe about June of 2016.  Toe was hurting primarily in the morning time. She denies claudication symptoms. She denies any prior episodes.  She denies family history of abdominal aortic aneurysm. She denies history of diabetes. Other medical problems include COPD, arthritis, coronary artery disease area all of these are currently stable.  Dr. Oneida Alar last saw pt on 12/04/14. At that time CT angiogram of the abdomen and pelvis with runoff was reviewed. There is a 50% stenosis of the left superficial femoral artery. Otherwise her arterial tree is without any significant pathology. However, a mass was detected in the central aspect of her liver. This was approximate 5-6 cm in diameter. A follow-up MRI of the abdomen was performed which suggests that this mass may be malignant. There were also several masses noted in the tail of the pancreas.  Improved left fifth toe. In light of the fact that the plaque in her left SFA seemed fairly benign Dr. Oneida Alar believes the best option at this point would be dual antiplatelets therapy with Plavix and aspirin for 6 months. If her symptoms are overall stable at the end of 6 months we could switch back to just aspirin alone. Dr. Oneida Alar prescriped Plavix but told her not to fill this until after she is seen by the oncology doctors as she will most likely need a biopsy of her liver. After the workup of her liver/pancreas mass is concluded she could start her Plavix at that point.  If the patient has an additional embolic event she may need to be considered for left superficial femoral artery stenting.   Pt returns today for 6 months follow up with ABI's.  She had liver resection in December of 2016, pt  states half of her liver was removed. Her stamina has been decreased from this and does not walk much.  She denies claudication sx's with walking. She states the blue color in her left 5th toe has resolved.   Pt denies any hx of stroke or TIA, denies any cardiac issues.  Pt Diabetic: No Pt smoker: smoker  (1/2, decreased from 1-2 ppd, started at age 89 yrs)  Pt meds include: Statin :No Betablocker: No ASA: Yes Other anticoagulants/antiplatelets: no, pt states her oncologist and surgeon advised her not to take Plavix   Past Medical History  Diagnosis Date  . COPD (chronic obstructive pulmonary disease) ([place])   . Productive cough   . Wears glasses   . Fibromyalgia   . Retina disorder     L eye, vision distorted, edema  . Macular degeneration     R eye  . Blue toe syndrome ([place]) 11/27/2014  . Osteoporosis   . Spine fracture due to birth trauma   . Cancer ([place]) 12/2014    liver cancer  . Complication of anesthesia   . PONV (postoperative nausea and vomiting)     nausea,severe vomiting after 01-13-15 portal vein embolization  . Arthritis     DDD. Right shoulder"is frozen"-limited ROM.  Marland Kitchen TMJ disease     Social History Social History  Substance Use Topics  . Smoking status: Current Every Day Smoker -- 1.00 packs/day for 45 years    Types: Cigarettes  . Smokeless tobacco: Never Used  .  Alcohol Use: No    Family History Family History  Problem Relation Age of Onset  . Hypertension Mother   . Atrial fibrillation Mother   . Heart disease Mother     after age 22  . Stroke Father   . Dementia Father   . Heart disease Brother     After age 75- A-Fib  . Heart attack Brother     Past Surgical History  Procedure Laterality Date  . Partial hysterectomy    . Kidney stone surgery    . Angioplasty  2008    no stents required, no follow-up with cardiologist, no recurrent chest pain  . Cataract extraction, bilateral Bilateral   . Portal vein embolization      01-13-15  -Dr. Cathlean Sauer.  . Eye surgery      cornea surgery  . Laparoscopic partial hepatectomy N/A 03/04/2015    Procedure: DIAGNOSTIC LAPAROSCOPY, EXTENDED RIGHT HEPATECTOMY, WITH INTRAOPERATIVE ULTRASOUND;  Surgeon: Stark Klein, MD;  Location: [facility];  Service: General;  Laterality: N/A;    Allergies  Allergen Reactions  . Amoxicillin-Pot Clavulanate Nausea Only    Has patient had a PCN reaction causing immediate rash, facial/tongue/throat swelling, SOB or lightheadedness with hypotension: No Has patient had a PCN reaction causing severe rash involving mucus membranes or skin necrosis: No Has patient had a PCN reaction that required hospitalization No Has patient had a PCN reaction occurring within the last 10 years: No If all of the above answers are "NO", then may proceed with Cephalosporin use.   . Paroxetine Hcl Rash  . Sulfa Antibiotics Rash    Current Outpatient Prescriptions  Medication Sig Dispense Refill  . aspirin EC 81 MG tablet Take 81 mg by mouth daily.    . carboxymethylcellulose (REFRESH TEARS) 0.5 % SOLN Place 1-2 drops into both eyes 3 (three) times daily as needed (for dry eyes).     Marland Kitchen HYDROcodone-acetaminophen (NORCO/VICODIN) 5-325 MG tablet Take 1 tablet by mouth every 4 (four) hours as needed for moderate pain or severe pain. (Patient taking differently: Take 1 tablet by mouth every 8 (eight) hours as needed for moderate pain or severe pain. ) 30 tablet 0  . ibuprofen (ADVIL,MOTRIN) 200 MG tablet Take 200 mg by mouth every 6 (six) hours as needed for mild pain.     . Multiple Vitamin (MULTIVITAMIN WITH MINERALS) TABS tablet Take 1 tablet by mouth daily.    . naproxen (NAPROSYN) 375 MG tablet Take 1 tablet (375 mg total) by mouth 2 (two) times daily. 20 tablet 0  . orphenadrine (NORFLEX) 100 MG tablet Take 1 tablet (100 mg total) by mouth 2 (two) times daily. 30 tablet 0   No current facility-administered medications for this visit.    ROS: See HPI for  pertinent positives and negatives.   Physical Examination  Filed Vitals:   06/11/15 1059  BP: 115/67  Pulse: 74  Height: 5\' 6"  (1.676 m)  Weight: 109 lb 4.8 oz (49.578 kg)  SpO2: 98%   Body mass index is 17.65 kg/(m^2).  General: A&O x 3, WDWN, thin female. Gait: slow, halting Eyes: PERRLA. Pulmonary: limited air movement in all fields, moist cough Cardiac: regular rhythm with occasional premature contractions, no detected murmur.         Carotid Bruits Right Left   Negative Negative  Aorta is palpable. Radial pulses: right is 2+ and left is 1+ palpable  VASCULAR EXAM: Extremities without ischemic changes, without Gangrene; without open wounds.                                                                                                          LE Pulses Right Left       FEMORAL  3+ palpable  3+ palpable        POPLITEAL  2+ palpable   not palpable       POSTERIOR TIBIAL  not palpable   not palpable        DORSALIS PEDIS      ANTERIOR TIBIAL 1+ palpable  not palpable    Abdomen: soft, moderately tender to palpation s/p liver resection 3 months ago, no palpable masses, well healed surgical scar. Skin: no rashes, no ulcers. Musculoskeletal: no muscle wasting or atrophy.  Neurologic: A&O X 3; Appropriate Affect ; SENSATION: normal; MOTOR FUNCTION:  moving all extremities equally, motor strength 4/5 throughout. Speech is fluent/normal.  CN 2-12 intact.    Non-Invasive Vascular Imaging: DATE: 06/11/2015 ABI: RIGHT: 1.1 (1.2, 11/27/14), Waveforms: triphasic;  LEFT: 0.70 (1.0), Waveforms: monophasic (decreased from biphasic)   CT angiogram of the abdomen and pelvis with runoff: September 2016:  There is a 50% stenosis of the left superficial femoral artery. Otherwise her arterial tree is without any significant pathology. However, a mass was detected in the central aspect of her liver. This was approximate 5-6 cm in diameter. A follow-up MRI  of the abdomen was performed which suggests that this mass may be malignant. There were also several masses noted in the tail of the pancreas.    ASSESSMENT:  Tina Bailey is a 70 y.o. female who has a recent history of spontaneously resolved blue toe syndrome of the left 5th toe. CT angiogram in September 2016 shows 50% stenosis of the left superficial femoral artery. Otherwise her arterial tree is without any significant pathology.  Liver and pancreas masses incidentally found on this CTA resulted in resection of a malignant liver mass in December of 2016; she is trying to regain some stamina after this. She has no claudication sx's with walking and has no signs of ischemia in her feet/legs.  ABI's  Remain normal in the right LE with triphasic waveforms and decreased from normal to moderate arterial occlusive disease in the left LE with monophasic waveforms (decreased from biphasic six months prior).  Her atherosclerotic risk factors include active smoking (50 years), liver cancer, and COPD. She is trying to quit smoking, see Plan.    PLAN:  The patient was counseled re smoking cessation and given several free resources re smoking cessation.  Based on the patient's vascular studies and examination, pt will return to clinic in 6 months with ABI's.   I discussed in depth with the patient the nature of atherosclerosis, and emphasized the importance of maximal medical management including strict control of blood pressure, blood glucose, and lipid levels, obtaining regular exercise, and cessation of smoking.  The patient is aware that without maximal medical management the underlying atherosclerotic disease process will  progress, limiting the benefit of any interventions.  The patient was given information about PAD including signs, symptoms, treatment, what symptoms should prompt the patient to seek immediate medical care, and risk reduction measures to take.  Clemon Chambers, RN, MSN,  FNP-C [facility] Phone: [phone]  Clinic MD: Early  06/11/2015 11:16 AM

## 2015-06-17 ENCOUNTER — Telehealth: Payer: Self-pay | Admitting: *Deleted

## 2015-06-17 NOTE — Telephone Encounter (Signed)
Spoke with pt and informed pt re:  Liver cancer tumor marker AFP normal from labs done 06/09/15 as per Dr. Burr Medico.  Pt voiced understanding.

## 2015-06-19 ENCOUNTER — Encounter (HOSPITAL_COMMUNITY): Payer: Self-pay | Admitting: *Deleted

## 2015-06-19 ENCOUNTER — Other Ambulatory Visit: Payer: Self-pay

## 2015-06-19 ENCOUNTER — Telehealth: Payer: Self-pay

## 2015-06-19 ENCOUNTER — Ambulatory Visit (HOSPITAL_COMMUNITY)
Admission: RE | Admit: 2015-06-19 | Discharge: 2015-06-19 | Disposition: A | Discharge status: Home / Self Care | Payer: Medicare Other | Source: Ambulatory Visit | Attending: Hematology | Admitting: Hematology

## 2015-06-19 DIAGNOSIS — K862 Cyst of pancreas: Secondary | ICD-10-CM | POA: Diagnosis not present

## 2015-06-19 DIAGNOSIS — R938 Abnormal findings on diagnostic imaging of other specified body structures: Secondary | ICD-10-CM | POA: Diagnosis not present

## 2015-06-19 DIAGNOSIS — C220 Liver cell carcinoma: Secondary | ICD-10-CM | POA: Diagnosis not present

## 2015-06-19 DIAGNOSIS — Z4689 Encounter for fitting and adjustment of other specified devices: Secondary | ICD-10-CM

## 2015-06-19 DIAGNOSIS — Z9889 Other specified postprocedural states: Secondary | ICD-10-CM | POA: Diagnosis not present

## 2015-06-19 MED ORDER — GADOBENATE DIMEGLUMINE 529 MG/ML IV SOLN
10.0000 mL | Freq: Once | INTRAVENOUS | Status: AC | PRN
  Administered 2015-06-19: 10 mL via INTRAVENOUS

## 2015-06-19 NOTE — Progress Notes (Signed)
06-19-15 1615- Pt frequently asked questions repeatedly after questions were already answered.

## 2015-06-19 NOTE — Telephone Encounter (Signed)
-----   Message from Milus Banister, MD sent at 06/19/2015  7:27 AM EDT ----- Regarding: RE: Biliary stent Aletha Halim get this done next week.  Planning on just pulling the stent.  I don't think ERCP is necessarily needed.    Jessicalynn Deshong, Can you see if she can come in next Thursday (6th) for EGD, stent removal at [facility],  + MAC. Thanks   ----- Message -----    From: Stark Klein, MD    Sent: 06/17/2015   7:37 AM      To: Milus Banister, MD Subject: Biliary stent                                  This is the lady w the extended right hepatectomy and surgical stent. Wondering if u can pull and maybe do cholangiogram. MRI this Friday.  Thx FB

## 2015-06-19 NOTE — Telephone Encounter (Signed)
Pt was scheduled for 06/25/15 1130 am [facility] for stent removal.  EGD scheduled, pt instructed and medications reviewed.  Patient instructions mailed to home.  Patient to call with any questions or concerns.

## 2015-06-22 ENCOUNTER — Telehealth: Payer: Self-pay | Admitting: Hematology

## 2015-06-22 NOTE — Telephone Encounter (Signed)
I called her back [phone] regarding her abdomen MRI from 06/19/2015, and left message for her. The scan showed no evidence of recurrence, degenerative change of T2. Small cyst in her kidney. Otherwise negative. I encouraged her to call me back if she has any other further questions.  Truitt Merle  06/22/2015

## 2015-06-25 ENCOUNTER — Encounter (HOSPITAL_COMMUNITY)
Admission: RE | Disposition: A | Discharge status: Home / Self Care | Payer: Self-pay | Source: Ambulatory Visit | Attending: Gastroenterology

## 2015-06-25 ENCOUNTER — Ambulatory Visit (HOSPITAL_COMMUNITY)
Admission: RE | Admit: 2015-06-25 | Discharge: 2015-06-25 | Disposition: A | Discharge status: Home / Self Care | Payer: Medicare Other | Source: Ambulatory Visit | Attending: Gastroenterology | Admitting: Gastroenterology

## 2015-06-25 ENCOUNTER — Ambulatory Visit (HOSPITAL_COMMUNITY): Payer: Medicare Other | Admitting: Anesthesiology

## 2015-06-25 ENCOUNTER — Encounter (HOSPITAL_COMMUNITY): Payer: Self-pay | Admitting: *Deleted

## 2015-06-25 DIAGNOSIS — M549 Dorsalgia, unspecified: Secondary | ICD-10-CM | POA: Diagnosis not present

## 2015-06-25 DIAGNOSIS — F17210 Nicotine dependence, cigarettes, uncomplicated: Secondary | ICD-10-CM | POA: Diagnosis not present

## 2015-06-25 DIAGNOSIS — Z466 Encounter for fitting and adjustment of urinary device: Secondary | ICD-10-CM | POA: Insufficient documentation

## 2015-06-25 DIAGNOSIS — J449 Chronic obstructive pulmonary disease, unspecified: Secondary | ICD-10-CM | POA: Insufficient documentation

## 2015-06-25 DIAGNOSIS — Z880 Allergy status to penicillin: Secondary | ICD-10-CM | POA: Diagnosis not present

## 2015-06-25 DIAGNOSIS — Z4659 Encounter for fitting and adjustment of other gastrointestinal appliance and device: Secondary | ICD-10-CM | POA: Diagnosis not present

## 2015-06-25 DIAGNOSIS — Z4689 Encounter for fitting and adjustment of other specified devices: Secondary | ICD-10-CM

## 2015-06-25 DIAGNOSIS — C220 Liver cell carcinoma: Secondary | ICD-10-CM | POA: Diagnosis not present

## 2015-06-25 HISTORY — DX: Fracture of one rib, right side, initial encounter for closed fracture: S22.31XA

## 2015-06-25 HISTORY — PX: ESOPHAGOGASTRODUODENOSCOPY (EGD) WITH PROPOFOL: SHX5813

## 2015-06-25 SURGERY — ESOPHAGOGASTRODUODENOSCOPY (EGD) WITH PROPOFOL
Anesthesia: Monitor Anesthesia Care

## 2015-06-25 MED ORDER — ONDANSETRON HCL 4 MG/2ML IJ SOLN
INTRAMUSCULAR | Status: DC | PRN
  Administered 2015-06-25 (×2): 2 mg via INTRAVENOUS

## 2015-06-25 MED ORDER — ONDANSETRON HCL 4 MG/2ML IJ SOLN
INTRAMUSCULAR | Status: AC
  Filled 2015-06-25: qty 2

## 2015-06-25 MED ORDER — PROPOFOL 10 MG/ML IV BOLUS
INTRAVENOUS | Status: DC | PRN
  Administered 2015-06-25: 20 mg via INTRAVENOUS
  Administered 2015-06-25: 70 mg via INTRAVENOUS
  Administered 2015-06-25: 20 mg via INTRAVENOUS

## 2015-06-25 MED ORDER — SODIUM CHLORIDE 0.9 % IV SOLN: INTRAVENOUS | Status: DC

## 2015-06-25 MED ORDER — PROPOFOL 10 MG/ML IV BOLUS
INTRAVENOUS | Status: AC
Start: 2015-06-25 — End: 2015-06-25
  Filled 2015-06-25: qty 40

## 2015-06-25 MED ORDER — LACTATED RINGERS IV SOLN
INTRAVENOUS | Status: DC
  Administered 2015-06-25: 1000 mL via INTRAVENOUS

## 2015-06-25 MED ORDER — LIDOCAINE HCL (CARDIAC) 20 MG/ML IV SOLN
INTRAVENOUS | Status: DC | PRN
  Administered 2015-06-25: 40 mg via INTRAVENOUS
  Administered 2015-06-25: 20 mg via INTRAVENOUS

## 2015-06-25 MED ORDER — LIDOCAINE HCL (CARDIAC) 20 MG/ML IV SOLN
INTRAVENOUS | Status: AC
  Filled 2015-06-25: qty 5

## 2015-06-25 SURGICAL SUPPLY — 14 items

## 2015-06-25 NOTE — Op Note (Signed)
[facility] Patient Name: Tina Bailey Procedure Date: 06/25/2015 MRN: [id number] Attending MD: Milus Banister , MD Date of Birth: 25-Nov-1945 CSN:  Age: 70 Admit Type: Outpatient Procedure:                Upper GI endoscopy Indications:              removal of surgically placed biliary stent that has                            not passed on it's own Providers:                Milus Banister, MD, Laverta Baltimore, RN,                            William Dalton, Technician Referring MD:             Stark Klein, MD Medicines:                Monitored Anesthesia Care Complications:            No immediate complications. Estimated blood loss:                            None. Estimated Blood Loss:     Estimated blood loss was minimal. Procedure:                Pre-Anesthesia Assessment:                           - Prior to the procedure, a History and Physical                            was performed, and patient medications and                            allergies were reviewed. The patient's tolerance of                            previous anesthesia was also reviewed. The risks                            and benefits of the procedure and the sedation                            options and risks were discussed with the patient.                            All questions were answered, and informed consent                            was obtained. Prior Anticoagulants: The patient has                            taken no previous anticoagulant or antiplatelet  agents. ASA Grade Assessment: III - A patient with                            severe systemic disease. After reviewing the risks                            and benefits, the patient was deemed in                            satisfactory condition to undergo the procedure.                           After obtaining informed consent, the endoscope was                            passed under  direct vision. Throughout the                            procedure, the patient's blood pressure, pulse, and                            oxygen saturations were monitored continuously. The                            [id number]) scope was introduced through the                            mouth, and advanced to the second part of duodenum.                            The upper GI endoscopy was accomplished without                            difficulty. The patient tolerated the procedure                            well. Findings:      The surgically placed biliary stent was clearly located, extending into       the duodenum across the major papilla. The stent was grabbed with a       snare and removed from the patient. Following removal I reinserted the       adult gastroscope and noted bleeding just above the GE junction from       what appeared to be superficial trauma related to the stent removal. The       mucosa did not appear perforated. The bleeding stopped with simple       flushing and observation. There was yellow bile passing from the major       papilla into the duodenum. The examination was otherwise normal. Impression:               Previously placed (surgical) biliary stent was                            located in normal position and was removed. There  was minor trauma to the distal esophagus during                            stent removal. Moderate Sedation:      N/A- Per Anesthesia Care Recommendation:           - Patient has a contact number available for                            emergencies. The signs and symptoms of potential                            delayed complications were discussed with the                            patient. Return to normal activities tomorrow.                            Written discharge instructions were provided to the                            patient.                           - Clear liquid diet today.  Then regular diet                            starting tomorrow.                           - Continue present medications. Procedure Code(s):        --- Professional ---                           [phone], 52,XP,XS,XU,PT, Esophagogastroduodenoscopy,                            flexible, transoral; diagnostic, including                            collection of specimen(s) by brushing or washing,                            when performed (separate procedure) Diagnosis Code(s):        --- Professional ---                           C22.0, Liver cell carcinoma CPT copyright 2016 American Medical Association. All rights reserved. The codes documented in this report are preliminary and upon coder review may  be revised to meet current compliance requirements. Milus Banister, MD 06/25/2015 11:43:47 AM This report has been signed electronically. Number of Addenda: 0

## 2015-06-25 NOTE — Transfer of Care (Signed)
Immediate Anesthesia Transfer of Care Note  Patient: Tina Bailey  Procedure(s) Performed: Procedure(s) with comments: ESOPHAGOGASTRODUODENOSCOPY (EGD) WITH PROPOFOL (N/A) - stent removal   Patient Location: PACU  Anesthesia Type:MAC  Level of Consciousness:  sedated, patient cooperative and responds to stimulation  Airway & Oxygen Therapy:Patient Spontanous Breathing and Patient connected to nasal oxgen  Post-op Assessment:  Report given to PACU RN and Post -op Vital signs reviewed and stable  Post vital signs:  Reviewed and stable  Last Vitals: There were no vitals filed for this visit.  Complications: No apparent anesthesia complications

## 2015-06-25 NOTE — Anesthesia Postprocedure Evaluation (Signed)
Anesthesia Post Note  Patient: Tina Bailey  Procedure(s) Performed: Procedure(s) (LRB): ESOPHAGOGASTRODUODENOSCOPY (EGD) WITH PROPOFOL (N/A)  Patient location during evaluation: PACU Anesthesia Type: MAC Level of consciousness: awake and alert Pain management: pain level controlled Vital Signs Assessment: post-procedure vital signs reviewed and stable Respiratory status: spontaneous breathing, nonlabored ventilation, respiratory function stable and patient connected to nasal cannula oxygen Cardiovascular status: stable and blood pressure returned to baseline Anesthetic complications: no    Last Vitals:  Filed Vitals:   06/25/15 1200 06/25/15 1210  BP: 139/69 133/55  Pulse: 68   Temp:    Resp: 16     Last Pain:  Filed Vitals:   06/25/15 1210  PainSc: 3                  Tiajuana Amass

## 2015-06-25 NOTE — H&P (View-Only) (Signed)
[place]  [phone] [phone]) [phone]  Clinic follow Up Note   Patient Care Team: Orpah Melter, MD as PCP - General (Family Medicine) Elam Dutch, MD as Consulting Physician (Vascular Surgery) 06/09/2015   CHIEF COMPLAINTS:  Hepatocellular carcinoma  Oncology History   Hepatocellular carcinoma ([place])   Staging form: Liver (Excluding Intrahepatic Bile Ducts), AJCC 7th Edition     Clinical stage from 12/30/2014: Stage Unknown (T1, NX, M0) - Unsigned       Hepatocellular carcinoma ([place])   12/02/2014 Imaging Liver MRI showed a large lesion in segment 5 and 40, highly suspicious for malignancy. And a 1.2 cm indeterminate pancreatic body lesion. Mildly enlarged portocaval lymph node. Cholelithiasis.   12/19/2014 Initial Diagnosis Hepatocellular carcinoma ([place])   12/19/2014 Initial Biopsy Liver mass biopsy showed parathyroid carcinoma, positive for hep par 1, cytokeratin 8/18, cytokeratin 7 and MOC-31.   12/25/2014 Imaging CT chest showed a 3 mm groundglass probably nodule in the right upper lobe, mild right pericardial phrenic lymphadenopathy, no definitive evidence of metastasis.   01/13/2015 Procedure Liquid embolization of the right portal venous system by Interventional Radiologist Dr. Laurence Ferrari   03/04/2015 Surgery Liver right lobe hepatectomy, and the portal lymph nodes biopsy   03/04/2015 Surgery Hepatocellular carcinoma, poorly differentiated, spanning 8.5 cm, surgical margins were negative, lymphovascular invasion is identified, for portal lymph nodes were negative.   HISTORY OF PRESENTING ILLNESS:  Tina Bailey 70 y.o. female is here because of recently discovered liver mass.  She has had abdominal pain after meals for the past 4 years, occurs once every several months, usually resolves after 20-30 mins on its own. She was seen by her PCP, had [place] and her symptoms was felt to be related to her gall bladder stone.   She has had some  pressure feeling under right ribcage, which radiates to back, for the past 8-9 months, and slight decrease her appetite, and fatigue, she lost about 10 lbs.   She went to see Dr. Oneida Alar for left 5th toes color change (blue), no claudication, she underwent CT angiogram which incidentally showed a large liver mass and a 1.2 cm pancreatic cystic lesion. She was referred to [place] for further management.   She never had colonoscopy. She denies melena, and she is here, change of her bowel habit. Se lives with her daughter. She has extensive smoking history, no family history of malignancy.  CURRENT THERAPY: Surveillance   INTERIM HISTROY Tina Bailey for follow-up. She underwent right lobe hepatectomy by Dr. Barry Dienes in December 2016. She tolerated surgery very well, and has recovered very well. She complains about low back pain for the past few months, was seen by her primary care physician, had x-ray which was negative. She had a previous history spine fracture in 2015. In every 2017, she was found to have left fifth rib fracture, without trauma or injury. She has osteoporosis. She has mild fatigue, able to function well at home. During the visit, she expressed her uncertainty and anxiety about "am I cancer free?".   MEDICAL HISTORY:  Past Medical History  Diagnosis Date  . COPD (chronic obstructive pulmonary disease) ([place])   . Productive cough   . Wears glasses   . Fibromyalgia   . Retina disorder     L eye, vision distorted, edema  . Macular degeneration     R eye  . Blue toe syndrome ([place]) 11/27/2014  . Osteoporosis   . Spine fracture due to birth trauma   .  Cancer ([place]) 12/2014    liver cancer  . Complication of anesthesia   . PONV (postoperative nausea and vomiting)     nausea,severe vomiting after 01-13-15 portal vein embolization  . Arthritis     DDD. Right shoulder"is frozen"-limited ROM.  Marland Kitchen TMJ disease     SURGICAL HISTORY: Past Surgical History  Procedure Laterality Date  .  Partial hysterectomy    . Kidney stone surgery    . Angioplasty  2008    no stents required, no follow-up with cardiologist, no recurrent chest pain  . Cataract extraction, bilateral Bilateral   . Portal vein embolization      01-13-15 -Dr. Cathlean Sauer.  . Eye surgery      cornea surgery  . Laparoscopic partial hepatectomy N/A 03/04/2015    Procedure: DIAGNOSTIC LAPAROSCOPY, EXTENDED RIGHT HEPATECTOMY, WITH INTRAOPERATIVE ULTRASOUND;  Surgeon: Stark Klein, MD;  Location: [facility];  Service: General;  Laterality: N/A;    SOCIAL HISTORY: Social History   Social History  . Marital Status: Divorced    Spouse Name: N/A  . Number of Children: 3   . Years of Education: N/A   Occupational History  . 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 Smoking status: Current Every Day Smoker -- 1.00 packs/day for 45 years    Types: Cigarettes  . Smokeless tobacco: Never Used  . Alcohol Use: No  . Drug Use: No  . Sexual Activity: Not on file   Other Topics Concern  . Not on file   Social History Narrative    FAMILY HISTORY: Family History  Problem Relation Age of Onset  . Hypertension Mother   . Atrial fibrillation Mother   . Heart disease Mother     after age 72  . Stroke Father   . Dementia Father   . Heart disease Brother     After age 85- A-Fib  . Heart attack Brother     ALLERGIES:  is allergic to amoxicillin-pot clavulanate; paroxetine hcl; and sulfa antibiotics.  MEDICATIONS:  Current Outpatient Prescriptions  Medication Sig Dispense Refill  . carboxymethylcellulose (REFRESH TEARS) 0.5 % SOLN Place 1-2 drops into both eyes 3 (three) times daily as needed (for dry eyes).     Marland Kitchen HYDROcodone-acetaminophen (NORCO/VICODIN) 5-325 MG tablet Take 1 tablet by mouth every 4 (four) hours as needed for moderate pain or severe pain. 30 tablet 0  . ibuprofen (ADVIL,MOTRIN) 200 MG tablet Take 200 mg by mouth every 6 (six) hours as needed for  mild pain.     . Multiple Vitamin (MULTIVITAMIN WITH MINERALS) TABS tablet Take 1 tablet by mouth daily.    . sodium chloride 1 G tablet Take 1 tablet (1 g total) by mouth 2 (two) times daily with a meal. 14 tablet 0   No current facility-administered medications for this visit.    REVIEW OF SYSTEMS:   Constitutional: Denies fevers, chills or abnormal night sweats, (+) fatigue and 10 lb weight loss  Eyes: Denies blurriness of vision, double vision or watery eyes Ears, nose, mouth, throat, and face: Denies mucositis or sore throat Respiratory: Denies cough, dyspnea or wheezes Cardiovascular: Denies palpitation, chest discomfort or lower extremity swelling Gastrointestinal:  Denies nausea, heartburn or change in bowel habits, se HPI  Skin: Denies abnormal skin rashes Lymphatics: Denies new lymphadenopathy or easy bruising Neurological:Denies numbness, tingling or new weaknesses Behavioral/Psych: Mood is stable, no new changes  All other systems were reviewed with the  patient and are negative.  PHYSICAL EXAMINATION: ECOG PERFORMANCE STATUS: 1 - Symptomatic but completely ambulatory  Filed Vitals:   06/09/15 1304  BP: 143/57  Pulse: 83  Temp: 98.2 F (36.8 C)  Resp: 17   Filed Weights   06/09/15 1304  Weight: 109 lb 11.2 oz (49.76 kg)    GENERAL:alert, no distress and comfortable SKIN: skin color, texture, turgor are normal, no rashes or significant lesions EYES: normal, conjunctiva are pink and non-injected, sclera clear OROPHARYNX:no exudate, no erythema and lips, buccal mucosa, and tongue normal  NECK: supple, thyroid normal size, non-tender, without nodularity LYMPH:  no palpable lymphadenopathy in the cervical, axillary or inguinal LUNGS: clear to auscultation and percussion with normal breathing effort HEART: regular rate & rhythm and no murmurs and no lower extremity edema ABDOMEN:abdomen soft, non-tender and normal bowel sounds Musculoskeletal:no cyanosis of digits  and no clubbing  PSYCH: alert & oriented x 3 with fluent speech NEURO: no focal motor/sensory deficits  LABORATORY DATA:  I have reviewed the data as listed CBC Latest Ref Rng 06/10/2015 06/09/2015 03/11/2015  WBC 4.0 - 10.5 K/uL 9.2 9.0 11.2(H)  Hemoglobin 12.0 - 15.0 g/dL 13.2 13.8 8.8(L)  Hematocrit 36.0 - 46.0 % 39.1 42.6 25.6(L)  Platelets 150 - 400 K/uL 248 272 234    CMP Latest Ref Rng 06/10/2015 06/09/2015 03/13/2015  Glucose 65 - 99 mg/dL 143(H) 88 85  BUN 6 - 20 mg/dL 13 13.7 11  Creatinine 0.44 - 1.00 mg/dL 0.52 0.7 0.39(L)  Sodium 135 - 145 mmol/L 136 138 129(L)  Potassium 3.5 - 5.1 mmol/L 4.2 4.4 3.6  Chloride 101 - 111 mmol/L 99(L) - 95(L)  CO2 22 - 32 mmol/L 29 28 27   Calcium 8.9 - 10.3 mg/dL 9.3 9.6 8.4(L)  Total Protein 6.5 - 8.1 g/dL 7.5 7.7 -  Total Bilirubin 0.3 - 1.2 mg/dL 1.0 0.43 -  Alkaline Phos 38 - 126 U/L 225(H) 251(H) -  AST 15 - 41 U/L 34 34 -  ALT 14 - 54 U/L 28 26 -   AFP tumor marker  Status: Finalresult Visible to patient:  Not Released Nextappt: 09/07/2015 at 11:30 AM in Oncology [facility] 4) Dx:  Hepatocellular carcinoma ([place])         Ref Range 5d ago    AFP, Serum, Tumor Marker 0.0 - 8.3 ng/mL 4.1         PATHOLOGY REPORT  Diagnosis 03/04/2015 1. Liver, biopsy, left sided nodule - BENIGN HEPATIC PARENCHYMA. - THERE IS NO EVIDENCE OF MALIGNANCY. 2. Liver, biopsy, posterior sided nodule - BENIGN HEPATIC PARENCHYMA WITH ASSOCIATED FIBROUS TISSUE. - THERE IS NO EVIDENCE OF MALIGNANCY. 3. Lymph node, biopsy, portal - THERE IS NO EVIDENCE OF CARCINOMA IN 3 OF 3 LYMPH NODES (0/3). 4. Lymph node, biopsy, posterior portal - THERE IS NO EVIDENCE OF CARCINOMA IN 1 OF 1 LYMPH NODE (0/1). 5. Liver, hepatectomy, right lobe - HEPATOCELLULAR CARCINOMA, POORLY DIFFERENTIATED, SPANNING 8.5 CM. - THE SURGICAL RESECTION MARGINS ARE NEGATIVE FOR CARCINOMA. - LYMPHOVASCULAR INVASION IS IDENTIFIED. - SEE ONCOLOGY TABLE  BELOW. OTHER FINDINGS: - BENIGN HEPATIC PARENCHYMA WITH MILD PORTAL CHRONIC INFLAMMATION AND DILATED VASCULATURE. - GALLBLADDER WITH MILD CHRONIC CHOLECYSTITIS AND CHOLELITHIASIS.  RADIOGRAPHIC STUDIES: I have personally reviewed the radiological images as listed and agreed with the findings in the report.  Mr Liver W Wo Contrast 12/02/2014   IMPRESSION: 1. The first lesion of concern is a fairly vascular mass spanning segments 5 and 4B of the liver with capsule  or pseudocapsule appearance, and central enhancement which spreads peripherally on the later images, and also with associated hepatic capsular retraction. Differential diagnostic considerations for this lesion include peripheral cholangiocarcinoma ; hepatic epitheliod hemangioendothelioma ; hepatocellular carcinoma ; or treated metastatic disease. Biopsy is likely warranted. 2. The second lesion of concern is a 1.2 cm pancreatic body lesion with high T2 and low T1 signal characteristics, questionable attachment to the dorsal pancreatic duct, and some internal septation and probably some faint nodular enhancement internally. The appearance on CT, for example image 62 series 9, is somewhat more concerning for internal enhancement, although measurable enhancement is present on MRI. This could be a small macro cystic tumor with solid component; the primary ductal pancreatic adenocarcinoma with cystic degeneration; were cystic degeneration of an islet cell tumor such as insulinoma or glucagon adenoma. Metastatic disease to the pancreas could possibly appear this way. Intraductal papillary mucinous neoplasm with small solid component could also appear this way. Biopsy, surveillance, or further imaging characterization with nuclear medicine PET-CT may be warranted. 3. Cholelithiasis. 4. Mild biliary dilatation. 5. Mildly enlarged portacaval lymph node. In the setting of the hepatic and pancreatic lesions the appearance raises concern for the possibility of  early local metastatic disease. This could also be further characterized at PET-CT.   Electronically Signed   By: Van Clines M.D.   On: 12/02/2014 08:39   CT chest w contrast 12/25/2014 IMPRESSION: 1. Solitary 3 mm ground-glass pulmonary nodule in the right upper lobe. Recommend attention on follow-up chest CT in 3 months. 2. Mild right pericardiophrenic lymphadenopathy, possibly metastatic. 3. Mild centrilobular emphysema. 4. Nondisplaced healing subacute lateral left fifth rib fracture. 5. Right thyroid lobe 1.8 cm nodule. Recommend correlation with thyroid ultrasound. This follows ACR consensus guidelines: Managing Incidental Thyroid Nodules Detected on Imaging: White Paper of the ACR Incidental Thyroid Findings Committee. J Am Coll Radiol 2015; 12:143-150   ASSESSMENT & PLAN: 70 year old African-American female, with 45-pack-year smoking history, presented with incidental scan finding of a large peripheral liver lesion in the right lobe segmental 5 and a 4B, and a small pancreatic cystic lesion.  1. Hepatocellular carcinoma, [id number], stage IIIB, poorly differentiated  -I reviewed her surgical pathology findings with her in details. Her surgical margins were negative, she had a complete resection -No role for adjuvant therapy is needed.  -We discussed the risk of cancer recurrence. Giving the locally advanced stage, she is certainly at high risk for recurrence, especially local recurrence. -We discussed the surveillance for cancer recurrence after her surgery. I'll see her every 3-4 months with lab including AFP, and repeated surveillance CT scan every 6-12 months, for total 5 years   2. Pancreatic lesion -Abdominal MRI showed a 1.2 cm pancreatic body lesion, likely cystic with some solid component.  -Her imaging was reviewed in our tumor before, radiologist feels they pancreatic lesion is less likely malignancy. -We'll follow up clinically.  3. COPD and smoking cessation   -She will continue to follow-up with her primary care physician -I discussed the smoking cessation extensively. She is willing to cut back, not ready for crit completely yet.  4. Back pain -she will continue follow up with her PCP   Plan -I'll see her back in 3 months for follow-up, with lab and liver MRI one week before   All questions were answered. The patient knows to call the clinic with any problems, questions or concerns. I spent 25 minutes counseling the patient face to face. The total time spent in the appointment  was 30 minutes and more than 50% was on counseling.     Truitt Merle, MD 06/09/2015 8:19 AM

## 2015-06-25 NOTE — Interval H&P Note (Signed)
History and Physical Interval Note:  06/25/2015 10:29 AM  Tina Bailey  has presented today for surgery, with the diagnosis of biliary stent removal   The various methods of treatment have been discussed with the patient and family. After consideration of risks, benefits and other options for treatment, the patient has consented to  Procedure(s) with comments: ESOPHAGOGASTRODUODENOSCOPY (EGD) WITH PROPOFOL (N/A) - stent removal  as a surgical intervention .  The patient's history has been reviewed, patient examined, no change in status, stable for surgery.  I have reviewed the patient's chart and labs.  Questions were answered to the patient's satisfaction.     Milus Banister

## 2015-06-25 NOTE — Anesthesia Preprocedure Evaluation (Addendum)
Anesthesia Evaluation  Patient identified by MRN, date of birth, ID band Patient awake    Reviewed: Allergy & Precautions, NPO status , Patient's Chart, lab work & pertinent test results  History of Anesthesia Complications (+) PONV  Airway Mallampati: II  TM Distance: >3 FB Neck ROM: Full    Dental no notable dental hx. (+) Poor Dentition, Missing   Pulmonary COPD, Current Smoker,    Pulmonary exam normal breath sounds clear to auscultation       Cardiovascular negative cardio ROS Normal cardiovascular exam Rhythm:Regular Rate:Normal  Negative stress test 03/03/15   Neuro/Psych negative neurological ROS  negative psych ROS   GI/Hepatic negative GI ROS, S/p partial hepatectomy   Endo/Other  negative endocrine ROS  Renal/GU negative Renal ROS  negative genitourinary   Musculoskeletal negative musculoskeletal ROS (+) Fibromyalgia -  Abdominal   Peds negative pediatric ROS (+)  Hematology negative hematology ROS (+)   Anesthesia Other Findings   Reproductive/Obstetrics negative OB ROS                            Anesthesia Physical  Anesthesia Plan  ASA: III  Anesthesia Plan: MAC   Post-op Pain Management:    Induction: Intravenous  Airway Management Planned: Natural Airway and Nasal Cannula  Additional Equipment:   Intra-op Plan:   Post-operative Plan:   Informed Consent: I have reviewed the patients History and Physical, chart, labs and discussed the procedure including the risks, benefits and alternatives for the proposed anesthesia with the patient or authorized representative who has indicated his/her understanding and acceptance.     Plan Discussed with: CRNA  Anesthesia Plan Comments:         Anesthesia Quick Evaluation

## 2015-06-25 NOTE — Discharge Instructions (Signed)

## 2015-06-26 ENCOUNTER — Encounter (HOSPITAL_COMMUNITY): Payer: Self-pay | Admitting: Gastroenterology

## 2015-07-13 ENCOUNTER — Telehealth: Payer: Self-pay | Admitting: *Deleted

## 2015-07-13 NOTE — Telephone Encounter (Signed)
Pt called stating that she is still recovering from surgery.   Pt would like to have an excuse letter from  jury duty since pt did not think she could sit for a long time for the procedure. Pt's   Phone    [phone].

## 2015-07-14 ENCOUNTER — Encounter: Payer: Self-pay | Admitting: Hematology

## 2015-07-14 ENCOUNTER — Telehealth: Payer: Self-pay | Admitting: *Deleted

## 2015-07-14 NOTE — Telephone Encounter (Signed)
Pt called again asking about a letter for jury duty in [place] was told that she just needed a note on MD letterhead stating that her medical condition prevented her from serving at this time.  She would like to be called when this is ready at home # & needs before 07/20/15 to deliver.  Call back [phone].

## 2015-07-15 ENCOUNTER — Ambulatory Visit: Payer: Medicare Other | Attending: General Surgery | Admitting: Physical Therapy

## 2015-07-15 DIAGNOSIS — M5414 Radiculopathy, thoracic region: Secondary | ICD-10-CM | POA: Diagnosis not present

## 2015-07-15 DIAGNOSIS — Z483 Aftercare following surgery for neoplasm: Secondary | ICD-10-CM | POA: Diagnosis present

## 2015-07-15 DIAGNOSIS — M6281 Muscle weakness (generalized): Secondary | ICD-10-CM | POA: Diagnosis present

## 2015-07-15 DIAGNOSIS — R293 Abnormal posture: Secondary | ICD-10-CM | POA: Diagnosis present

## 2015-07-15 DIAGNOSIS — M546 Pain in thoracic spine: Secondary | ICD-10-CM | POA: Insufficient documentation

## 2015-07-15 NOTE — Therapy (Signed)
[place], [place], [address] Phone: [phone]   Fax:  [phone]  Physical Therapy Evaluation  Patient Details  Name: Tina Bailey MRN: [id number] Date of Birth: 1945-07-14 Referring Provider: Dr. Stark Klein  Encounter Date: 07/15/2015      PT End of Session - 07/15/15 2117    Visit Number 1   Number of Visits 9   Date for PT Re-Evaluation 08/19/15   PT Start Time 1350   PT Stop Time 1443   PT Time Calculation (min) 53 min   Activity Tolerance Patient tolerated treatment well;Patient limited by pain   Behavior During Therapy Restless      Past Medical History  Diagnosis Date  . COPD (chronic obstructive pulmonary disease) ([place])   . Productive cough   . Wears glasses   . Fibromyalgia   . Retina disorder     L eye, vision distorted, edema  . Macular degeneration     R eye  . Osteoporosis   . Spine fracture due to birth trauma   . Cancer ([place]) 12/2014    liver cancer  . Complication of anesthesia   . PONV (postoperative nausea and vomiting)     nausea,severe vomiting after 01-13-15 portal vein embolization  . TMJ disease   . Blue toe syndrome ([place]) 11/27/2014    Dr. Claudia Pollock evaluating  . Arthritis     DDD. Right shoulder"is frozen"-limited ROM. osteoporosis.  . Fracture of rib of right side     hx "osteoporosis"- states her dog nudge her on the side, next day developed great pain and was told has a fracture rib.    Past Surgical History  Procedure Laterality Date  . Partial hysterectomy    . Kidney stone surgery    . Angioplasty  2008    no stents required, no follow-up with cardiologist, no recurrent chest pain  . Cataract extraction, bilateral Bilateral   . Portal vein embolization      01-13-15 -Dr. Cathlean Sauer.  . Eye surgery      cornea surgery  . Laparoscopic partial hepatectomy N/A 03/04/2015    Procedure: DIAGNOSTIC LAPAROSCOPY, EXTENDED RIGHT  HEPATECTOMY, WITH INTRAOPERATIVE ULTRASOUND;  Surgeon: Stark Klein, MD;  Location: [facility];  Service: General;  Laterality: N/A;  . Esophagogastroduodenoscopy (egd) with propofol N/A 06/25/2015    Procedure: ESOPHAGOGASTRODUODENOSCOPY (EGD) WITH PROPOFOL;  Surgeon: Milus Banister, MD;  Location: [facility];  Service: Endoscopy;  Laterality: N/A;  stent removal     There were no vitals filed for this visit.       Subjective Assessment - 07/15/15 1355    Subjective Pain feels like it's in the inside around the spine in front. Gestures that it is in a band around her trunk at lower thoracic level.   Feels like she needs help to hold her stomach in and hold herself up.  Feels like she has a brick in her gut.  Complains that she can't do much.  Says she was physically active until this diagnosis.  Recently tried to do side leg raises but she couldn't do them; she also has pain when trying to do things.  She has tried an abdominal binder but the compression hurt.   Pertinent History Had partial hepatectomy for liver cancer and now has pain across abdomen; surgery was in March 04, 2015 but just had a stent removed.  SOB and reports she does have COPD, but it's partly from pain and difficulty  holding herself upright.  h/o compression fractures in her upper thoracic spine in 2015; has osteoporosis.  Later had fractures of L3 and L4. Fibromyalgia diagnosed some 25 years ago, and says she simply fought through the pain.  Did a regimen of daily exercise until she had her fractures, when they told her she was causing those with her exercises and having osteoporosis.  Had a left rib fracture in February; has had a CT scan and no fracture was seen at the level of her current pain.   Patient Stated Goals you can tell me if it is muscles that I have to find to be able to straighten up and feel better   Currently in Pain? Yes   Pain Score 2    Pain Location Abdomen  and back   Pain Orientation Left   Pain  Descriptors / Indicators Sore   Pain Type Surgical pain   Pain Onset More than a month ago   Aggravating Factors  standing from sitting   Pain Relieving Factors pain meds, standing or lying down; patient demonstrates that she can relieve the pain at least partially in standing by placing her hands on her hips and elongating her trunk, essentially placing a traction on her mid-level spine.            [facility] PT Assessment - 07/15/15 0001    Assessment   Medical Diagnosis s/p partial hepactectomy for liver cancer   Referring Provider Dr. Stark Klein   Onset Date/Surgical Date 03/04/15   Precautions   Precautions Fall   Precaution Comments significant osteoporosis with h/o multiple fractures; cancer precautions   Restrictions   Weight Bearing Restrictions No   Balance Screen   Has the patient fallen in the past 6 months No   Has the patient had a decrease in activity level because of a fear of falling?  No  but is cautious   Is the patient reluctant to leave their home because of a fear of falling?  No   Home Ecologist residence   Living Arrangements Children  daughter and son-in-law   Type of [place] One level   Prior [place] Retired   Leisure no current exercise; did exercise previously but had to stop   Cognition   Overall Cognitive Status Within Functional Limits for tasks assessed   Observation/Other Assessments   Observations Pt. short of breath just sitting, and says this is part COPD and part pain/ positioning; fidgety and kind of hyper, speaking quickly and saying, "I prattle."  In looking at her spine, she has a protrusion at about T10 level, give or take.  Pain is worse when she stands without support, and better when she stands with hands on hips and can push into a more erect position.  In general she appears thin and with overall muscle atrophy.   Skin Integrity Incision is well-healed.  Some spotty skin  lesions on her back that she explains is intermittent acne that she gets.   Posture/Postural Control   Posture/Postural Control Postural limitations   Postural Limitations Forward head  shoulders elevated   Posture Comments Patient demonstrates that she can effect relief of her back pain in standing by placing her hands on her hips and pushing down there while she assumes a more erect stance, in essence putting a traction force on her spine.  In sitting, she leans forward some or sits with back against chairback.  Palpation   Palpation comment liver resection incision is well-healed and mobile; also has old hysterectomy incision   Ambulation/Gait   Ambulation/Gait Yes   Ambulation/Gait Assistance 6: Modified independent (Device/Increase time)  mild gait abnormalities   Gait Comments right shoulder elevated with slight lean; slightly unstead; no AD;                               Short Term Clinic Goals - 07/15/15 2142    CC Short Term Goal  #1   Title Patient will be independent in a safe exercise program to positively affect her osteoporosis and back pain, including safe core and limb strengthening.   Time 3   Period Weeks   Status New             Long Term Clinic Goals - 07/15/15 2143    CC Long Term Goal  #1   Title Patient will be independent in a more advanced home exerise program appropriate to her osteoporosis history.   Time 5   Period Weeks   Status New   CC Long Term Goal  #2   Title Patient will report at least 50% decrease in trunk pain.   Time 5   Period Weeks   Status New   CC Long Term Goal  #3   Title Patient will be knowledgeable about risk reduction related to her osteoporosis condition and compression fracture history.            Plan - 07/15/15 2118    Clinical Impression Statement This is a woman who describes herself as having been very active with exercise in the past.  She has osteoporosis with a h/o compression  fractures at more than one level of her spine.  She underwent partial resection of her liver in December 2016 and in the last couple of months has had signficant mid-level thoracic pain.  She describes a band of pain around her trunk at a low thoracic level that is not superficial, and that can be helped with lying down or in standing by putting hands on hips and pressing down while actively holding her trunk more erect.     Rehab Potential Good   Clinical Impairments Affecting Rehab Potential apparent COPD; osteoporosis with h/o fractures   PT Frequency 2x / week   PT Duration 4 weeks   PT Treatment/Interventions ADLs/Self Care Home Management;Cryotherapy;Electrical Stimulation;Moist Heat;DME Instruction;Functional mobility training;Therapeutic activities;Therapeutic exercise;Patient/family education;Manual techniques;Scar mobilization;Other (comment)  osteoporosis-specific exercise and education   PT Next Visit Plan Transfer to [facility] clinic to see osteoporosis specialist there; that therapist to assess further and begin decompression exercise as well as use of modalities such as electrical stimulation.   Recommended Other Services Dr. Rosario Jacks office was phoned and a message left for her today reporting on this evaluation and inquiring about whether scans this patient had recently had been evaluated for possible new compression fracture that  might be the cause of her current pain.   Consulted and Agree with Plan of Care Patient      Patient will benefit from skilled therapeutic intervention in order to improve the following deficits and impairments:  Pain, Postural dysfunction, Abnormal gait, Decreased strength, Other (comment) (osteoporosis with h/o spinal compression fractures)  Visit Diagnosis: Radiculopathy, thoracic region - Plan: PT plan of care cert/re-cert  Pain in thoracic spine - Plan: PT plan of care cert/re-cert  Abnormal posture - Plan: PT plan of care  cert/re-cert  Aftercare following surgery for neoplasm - Plan: PT plan of care cert/re-cert  Muscle weakness (generalized) - Plan: PT plan of care cert/re-cert      G-Codes - [id number]    Functional Assessment Tool Used clinical judgement   Functional Limitation Self care  related to pain and osteoporosis issues   Self Care Current Status [id number]) At least 40 percent but less than 60 percent impaired, limited or restricted   Self Care Goal Status [id number]) At least 1 percent but less than 20 percent impaired, limited or restricted       Problem List Patient Active Problem List   Diagnosis Date Noted  . Constipation 04/26/2015  . Chronic pain 04/26/2015  . Depression 04/26/2015  . Hepatocellular adenoma 03/04/2015  . COPD GOLD III  03/03/2015  . Cigarette smoker 03/03/2015  . Cough 03/03/2015  . Liver cancer ([place]) 01/13/2015  . Hepatocellular carcinoma ([place]) 12/30/2014  . Cancer, hepatocellular ([place])   . Liver mass 12/04/2014  . Pancreatic mass 12/04/2014  . Blue toe syndrome ([place]) 11/27/2014    Stephen Turnbaugh 07/15/2015, 9:50 PM  [place], [place], [address] Phone: [phone]   Fax:  [phone]  Name: Tina Bailey MRN: [id number] Date of Birth: 10-14-1945   Serafina Royals, PT 07/15/2015 9:50 PM

## 2015-07-28 ENCOUNTER — Ambulatory Visit: Payer: Medicare Other | Attending: General Surgery | Admitting: Physical Therapy

## 2015-07-28 DIAGNOSIS — R293 Abnormal posture: Secondary | ICD-10-CM | POA: Diagnosis present

## 2015-07-28 DIAGNOSIS — M546 Pain in thoracic spine: Secondary | ICD-10-CM | POA: Insufficient documentation

## 2015-07-28 DIAGNOSIS — Z483 Aftercare following surgery for neoplasm: Secondary | ICD-10-CM | POA: Insufficient documentation

## 2015-07-28 DIAGNOSIS — M6281 Muscle weakness (generalized): Secondary | ICD-10-CM | POA: Insufficient documentation

## 2015-07-28 DIAGNOSIS — M5414 Radiculopathy, thoracic region: Secondary | ICD-10-CM | POA: Insufficient documentation

## 2015-07-28 NOTE — Therapy (Signed)
[facility] [address] [place], [place], [address] Phone: [phone]   Fax:  [phone]  Physical Therapy Treatment  Patient Details  Name: Tina Bailey MRN: [id number] Date of Birth: 01-13-1946 Referring Provider: Dr. Stark Klein  Encounter Date: 07/28/2015      PT End of Session - 07/28/15 1022    Visit Number 2   Number of Visits 9   Date for PT Re-Evaluation 08/19/15   PT Start Time 1016   PT Stop Time 1055   PT Time Calculation (min) 39 min   Activity Tolerance Patient tolerated treatment well;Patient limited by pain   Behavior During Therapy Restless      Past Medical History  Diagnosis Date  . COPD (chronic obstructive pulmonary disease) ([place])   . Productive cough   . Wears glasses   . Fibromyalgia   . Retina disorder     L eye, vision distorted, edema  . Macular degeneration     R eye  . Osteoporosis   . Spine fracture due to birth trauma   . Cancer ([place]) 12/2014    liver cancer  . Complication of anesthesia   . PONV (postoperative nausea and vomiting)     nausea,severe vomiting after 01-13-15 portal vein embolization  . TMJ disease   . Blue toe syndrome ([place]) 11/27/2014    Dr. Claudia Pollock evaluating  . Arthritis     DDD. Right shoulder"is frozen"-limited ROM. osteoporosis.  . Fracture of rib of right side     hx "osteoporosis"- states her dog nudge her on the side, next day developed great pain and was told has a fracture rib.    Past Surgical History  Procedure Laterality Date  . Partial hysterectomy    . Kidney stone surgery    . Angioplasty  2008    no stents required, no follow-up with cardiologist, no recurrent chest pain  . Cataract extraction, bilateral Bilateral   . Portal vein embolization      01-13-15 -Dr. Cathlean Sauer.  . Eye surgery      cornea surgery  . Laparoscopic partial hepatectomy N/A 03/04/2015    Procedure: DIAGNOSTIC LAPAROSCOPY, EXTENDED RIGHT  HEPATECTOMY, WITH INTRAOPERATIVE ULTRASOUND;  Surgeon: Stark Klein, MD;  Location: [facility];  Service: General;  Laterality: N/A;  . Esophagogastroduodenoscopy (egd) with propofol N/A 06/25/2015    Procedure: ESOPHAGOGASTRODUODENOSCOPY (EGD) WITH PROPOFOL;  Surgeon: Milus Banister, MD;  [facility];  Service: Endoscopy;  Laterality: N/A;  stent removal     There were no vitals filed for this visit.      Subjective Assessment - 07/28/15 1024    Subjective I have som much pain.    Pertinent History Had partial hepatectomy for liver cancer and now has pain across abdomen; surgery was in March 04, 2015 but just had a stent removed.  SOB and reports she does have COPD, but it's partly from pain and difficulty holding herself upright.  h/o compression fractures in her upper thoracic spine in 2015; has osteoporosis.  Later had fractures of L3 and L4. Fibromyalgia diagnosed some 25 years ago, and says she simply fought through the pain.  Did a regimen of daily exercise until she had her fractures, when they told her she was causing those with her exercises and having osteoporosis.  Had a left rib fracture in February; has had a CT scan and no fracture was seen at the level of her current pain.   Patient Stated Goals  you can tell me if it is muscles that I have to find to be able to straighten up and feel better   Currently in Pain? Yes   Pain Score 3    Pain Location Abdomen   Pain Descriptors / Indicators Sore   Pain Type Surgical pain   Pain Onset More than a month ago   Pain Frequency Constant   Aggravating Factors  standing to sitting   Pain Relieving Factors standing or lying down, medication   Multiple Pain Sites No                         [facility] Adult PT Treatment/Exercise - 07/28/15 0001    Self-Care   Self-Care Scar Mobilizations   Scar Mobilizations tissue rolling, scar massage, scar lift   Exercises   Exercises --  diphagramatc breathing in supine and  sitting   Manual Therapy   Manual Therapy Soft tissue mobilization;Myofascial release   Soft tissue mobilization lift from posterior paraspinals anteriorly in supine,    Myofascial Release rectus abdominus, diaphragm,                 PT Education - 07/28/15 1107    Education provided Yes   Education Details scar massage, diaphragmatic breathing   Person(s) Educated Patient   Methods Explanation;Demonstration;Verbal cues;Handout   Comprehension Returned demonstration;Verbalized understanding          PT Short Term Goals - 07/28/15 1352    PT SHORT TERM GOAL #1   Title Patient will be independent in a safe exercise program to positively affect her osteoporosis and back pain, including safe core and limb strengthening   Time 3   Period Weeks   Status New         Short Term Clinic Goals - 07/15/15 2142    CC Short Term Goal  #1   Title Patient will be independent in a safe exercise program to positively affect her osteoporosis and back pain, including safe core and limb strengthening.   Time 3   Period Weeks   Status New          PT Long Term Goals - 07/28/15 1353    PT LONG TERM GOAL #1   Title Patient will be independent in a more advance home exercise program apprpriate to her osteoporosis history   Time 3   Period Weeks   Status New   PT LONG TERM GOAL #2   Title Patient will report at least 50% decrease in trunk pain   Time 5   Period Weeks   Status New   PT LONG TERM GOAL #3   Title Patient will be knowledgeable about risk reduction related to her osteoporosis condition and compression fracture history   Time 5   Period Weeks   Status New           Long Term Clinic Goals - 07/15/15 2143    CC Long Term Goal  #1   Title Patient will be independent in a more advanced home exerise program appropriate to her osteoporosis history.   Time 5   Period Weeks   Status New   CC Long Term Goal  #2   Title Patient will report at least 50% decrease in  trunk pain.   Time 5   Period Weeks   Status New   CC Long Term Goal  #3   Title Patient will be knowledgeable about risk reduction related to her osteoporosis  condition and compression fracture history.            Plan - 07/28/15 1057    Clinical Impression Statement Patient had tightness in her abdominal scar and restricted dipahragm.  Therapy focused on the releasing of the tissue and improve breathing.  After patient was able to take deep breaths without panting and move with greater ease.  Patient reports she is able to hold her purse with greater ease.  Patient was educated on scar massage and diaphragmatic breathing.  Patient will benefiit from physical therapy to improve strength, movement with education on osteoporosis.    Rehab Potential Good   Clinical Impairments Affecting Rehab Potential apparent COPD; osteoporosis with h/o fractures   PT Frequency 2x / week   PT Duration 4 weeks   PT Treatment/Interventions ADLs/Self Care Home Management;Cryotherapy;Electrical Stimulation;Moist Heat;DME Instruction;Functional mobility training;Therapeutic activities;Therapeutic exercise;Patient/family education;Manual techniques;Scar mobilization;Other (comment)   PT Next Visit Plan  decompression exercise as well as use of modalities such as electrical stimulation; transitional movements with abdominal control, instruct on osteo, walking program, psoture   PT Home Exercise Plan decompression exercises   Consulted and Agree with Plan of Care Patient      Patient will benefit from skilled therapeutic intervention in order to improve the following deficits and impairments:  Pain, Postural dysfunction, Abnormal gait, Decreased strength, Other (comment)  Visit Diagnosis: Radiculopathy, thoracic region  Pain in thoracic spine  Abnormal posture  Aftercare following surgery for neoplasm  Muscle weakness (generalized)     Problem List Patient Active Problem List   Diagnosis Date  Noted  . Constipation 04/26/2015  . Chronic pain 04/26/2015  . Depression 04/26/2015  . Hepatocellular adenoma 03/04/2015  . COPD GOLD III  03/03/2015  . Cigarette smoker 03/03/2015  . Cough 03/03/2015  . Liver cancer ([place]) 01/13/2015  . Hepatocellular carcinoma ([place]) 12/30/2014  . Cancer, hepatocellular ([place])   . Liver mass 12/04/2014  . Pancreatic mass 12/04/2014  . Blue toe syndrome ([place]) 11/27/2014    Earlie Counts, PT 07/28/2015 1:58 PM   [facility] [address], [place], [place], [address] Phone: [phone]   Fax:  [phone]  Name: Tina Bailey MRN: [id number] Date of Birth: 1946/03/11

## 2015-07-28 NOTE — Patient Instructions (Signed)
Scar Massage  Scar massage is done to improve the mobility of scar, decrease scar tissue from building up, reduce adhesions, and prevent Keloids from forming. Start scar massage after scabs have fallen off by themselves and no open areas. The first few weeks after surgery, it is normal for a scar to appear pink or red and slightly raised. Scars can itch or have areas of numbness. Some scars may be sensitive.   Direct Scar massage: after scar is healed, no opening, no scab 1.  Place pads of two fingers together directly on the scar starting at one end of the scar. Move the fingers up and down across the scar holding 5 seconds one direction.  Then go opposite direction hold 5 seconds.  2. Move over to the next section of the scar and repeat.  Work your way along the entire length of the scar.   3. Next make diagonal movements along the scar holding 5 seconds at one direction. 4. Next movement is side to side. 5. Do not rub fingers over the scar.  Instead keep firm pressure and move scar over the tissue it is on top   Scar Lift and Roll 12 weeks after surgery. 1. Pinch a small amount of the scar between your first two fingers and thumb.  2. Roll the scar between your fingers for 5 to 15 seconds. 3. Move along the scar and repeat until you have massaged the entire length of scar.   Stop the massage and call your doctor if you notice: 1. Increased redness 2. Bleeding from scar 3. Seepage coming from the scar 4. Scar is warmer and has increased pain   Supine    Lie flat with pillow support. Allow body's muscles to relax. Place hands on belly. Inhale slowly and deeply for _3__ seconds, so hands move up. Then take 3___ seconds to exhale. Repeat __5_ times. Do __2_ times a day.  Copyright  VHI. All rights reserved.   Sitting    Sit comfortably. Allow body's muscles to relax. Place hands on belly. Inhale slowly and deeply for _3__ seconds, so hands move out. Then take _3__ seconds to  exhale. Repeat _5__ times. Do _2__ times a day.  Copyright  VHI. All rights reserved.   [place] [address] [place] , [place] [address] Phone # [phone] Fax [phone]

## 2015-07-30 ENCOUNTER — Ambulatory Visit: Payer: Medicare Other

## 2015-07-30 DIAGNOSIS — M5414 Radiculopathy, thoracic region: Secondary | ICD-10-CM

## 2015-07-30 DIAGNOSIS — R293 Abnormal posture: Secondary | ICD-10-CM

## 2015-07-30 DIAGNOSIS — M6281 Muscle weakness (generalized): Secondary | ICD-10-CM

## 2015-07-30 DIAGNOSIS — M546 Pain in thoracic spine: Secondary | ICD-10-CM

## 2015-07-30 NOTE — Therapy (Signed)
[facility] [address], [place], [place], [address] Phone: [phone]   Fax:  [phone]  Physical Therapy Treatment  Patient Details  Name: Tina Bailey MRN: [id number] Date of Birth: 1945/06/05 Referring Provider: Dr. Stark Klein  Encounter Date: 07/30/2015      PT End of Session - 07/30/15 1212    Visit Number 3   Number of Visits 10   Date for PT Re-Evaluation 08/19/15   PT Start Time 1146   PT Stop Time 1226   PT Time Calculation (min) 40 min   Activity Tolerance Patient tolerated treatment well   Behavior During Therapy [facility] for tasks assessed/performed      Past Medical History  Diagnosis Date  . COPD (chronic obstructive pulmonary disease) ([place])   . Productive cough   . Wears glasses   . Fibromyalgia   . Retina disorder     L eye, vision distorted, edema  . Macular degeneration     R eye  . Osteoporosis   . Spine fracture due to birth trauma   . Cancer ([place]) 12/2014    liver cancer  . Complication of anesthesia   . PONV (postoperative nausea and vomiting)     nausea,severe vomiting after 01-13-15 portal vein embolization  . TMJ disease   . Blue toe syndrome ([place]) 11/27/2014    Dr. Claudia Pollock evaluating  . Arthritis     DDD. Right shoulder"is frozen"-limited ROM. osteoporosis.  . Fracture of rib of right side     hx "osteoporosis"- states her dog nudge her on the side, next day developed great pain and was told has a fracture rib.    Past Surgical History  Procedure Laterality Date  . Partial hysterectomy    . Kidney stone surgery    . Angioplasty  2008    no stents required, no follow-up with cardiologist, no recurrent chest pain  . Cataract extraction, bilateral Bilateral   . Portal vein embolization      01-13-15 -Dr. Cathlean Sauer.  . Eye surgery      cornea surgery  . Laparoscopic partial hepatectomy N/A 03/04/2015    Procedure: DIAGNOSTIC LAPAROSCOPY, EXTENDED  RIGHT HEPATECTOMY, WITH INTRAOPERATIVE ULTRASOUND;  Surgeon: Stark Klein, MD;  Location: [facility];  Service: General;  Laterality: N/A;  . Esophagogastroduodenoscopy (egd) with propofol N/A 06/25/2015    Procedure: ESOPHAGOGASTRODUODENOSCOPY (EGD) WITH PROPOFOL;  Surgeon: Milus Banister, MD;  Location: [facility];  Service: Endoscopy;  Laterality: N/A;  stent removal     There were no vitals filed for this visit.      Subjective Assessment - 07/30/15 1149    Subjective Pt reports that she is working on her breathing and using her diaphram     Pertinent History Had partial hepatectomy for liver cancer and now has pain across abdomen; surgery was in March 04, 2015 but just had a stent removed.  SOB and reports she does have COPD, but it's partly from pain and difficulty holding herself upright.  h/o compression fractures in her upper thoracic spine in 2015; has osteoporosis.  Later had fractures of L3 and L4. Fibromyalgia diagnosed some 25 years ago, and says she simply fought through the pain.  Did a regimen of daily exercise until she had her fractures, when they told her she was causing those with her exercises and having osteoporosis.  Had a left rib fracture in February; has had a CT scan and no fracture was seen at the level  of her current pain.   Currently in Pain? Yes   Pain Score 3    Pain Location Abdomen   Pain Orientation Left   Pain Descriptors / Indicators Sore   Pain Type Surgical pain   Pain Onset More than a month ago   Pain Frequency Constant   Aggravating Factors  sitting upright, standing to sitting,   Pain Relieving Factors medication, lying down                         [facility] Adult PT Treatment/Exercise - 07/30/15 0001    Exercises   Exercises Neck   Neck Exercises: Standing   Other Standing Exercises corner stretch 10" hold x 5.  Difficult for pt due to Rt shoulder stiffness   Neck Exercises: Supine   Other Supine Exercise decompression x 4  minutes followed by [facility] deompression exercises   Manual Therapy   Manual Therapy Soft tissue mobilization;Myofascial release   Soft tissue mobilization lift from posterior paraspinals anteriorly in supine,    Myofascial Release rectus abdominus, diaphragm,                 PT Education - 07/30/15 1157    Education provided Yes   Education Details osteporosis information, decompression   Person(s) Educated Patient   Methods Explanation;Demonstration;Handout   Comprehension Verbalized understanding;Returned demonstration          PT Short Term Goals - 07/28/15 1352    PT SHORT TERM GOAL #1   Title Patient will be independent in a safe exercise program to positively affect her osteoporosis and back pain, including safe core and limb strengthening   Time 3   Period Weeks   Status New         Short Term Clinic Goals - 07/15/15 2142    CC Short Term Goal  #1   Title Patient will be independent in a safe exercise program to positively affect her osteoporosis and back pain, including safe core and limb strengthening.   Time 3   Period Weeks   Status New          PT Long Term Goals - 07/28/15 1353    PT LONG TERM GOAL #1   Title Patient will be independent in a more advance home exercise program apprpriate to her osteoporosis history   Time 3   Period Weeks   Status New   PT LONG TERM GOAL #2   Title Patient will report at least 50% decrease in trunk pain   Time 5   Period Weeks   Status New   PT LONG TERM GOAL #3   Title Patient will be knowledgeable about risk reduction related to her osteoporosis condition and compression fracture history   Time 5   Period Weeks   Status New           Long Term Clinic Goals - 07/15/15 2143    CC Long Term Goal  #1   Title Patient will be independent in a more advanced home exerise program appropriate to her osteoporosis history.   Time 5   Period Weeks   Status New   CC Long Term Goal  #2   Title Patient will  report at least 50% decrease in trunk pain.   Time 5   Period Weeks   Status New   CC Long Term Goal  #3   Title Patient will be knowledgeable about risk reduction related to her osteoporosis condition and  compression fracture history.            Plan - 07/30/15 1209    Clinical Impression Statement Pt with improved use of deep breathing and activating diaphram.  PT issued osteoporosis information and decompression exercises today.  Pt verbalized and demonstrated understanding.  Pt with weak spinal extensors and core and reduced thoracic mobility and flexiblity s/p surgery.  Pt will continue to benefit from skilled PT for soft tissue work, core strength. posutral strength and deep breathing.     Rehab Potential Good   PT Frequency 2x / week   PT Duration 4 weeks   PT Treatment/Interventions ADLs/Self Care Home Management;Cryotherapy;Electrical Stimulation;Moist Heat;DME Instruction;Functional mobility training;Therapeutic activities;Therapeutic exercise;Patient/family education;Manual techniques;Scar mobilization;Other (comment)   PT Next Visit Plan reveiw decompression,  transitional movements with abdominal control, walking program, posture      Patient will benefit from skilled therapeutic intervention in order to improve the following deficits and impairments:  Pain, Postural dysfunction, Abnormal gait, Decreased strength, Other (comment)  Visit Diagnosis: Radiculopathy, thoracic region  Pain in thoracic spine  Abnormal posture  Muscle weakness (generalized)     Problem List Patient Active Problem List   Diagnosis Date Noted  . Constipation 04/26/2015  . Chronic pain 04/26/2015  . Depression 04/26/2015  . Hepatocellular adenoma 03/04/2015  . COPD GOLD III  03/03/2015  . Cigarette smoker 03/03/2015  . Cough 03/03/2015  . Liver cancer ([place]) 01/13/2015  . Hepatocellular carcinoma ([place]) 12/30/2014  . Cancer, hepatocellular ([place])   . Liver mass 12/04/2014  .  Pancreatic mass 12/04/2014  . Blue toe syndrome ([place]) 11/27/2014   Sigurd Sos, PT 07/30/2015 12:30 PM  [facility] [address] [place], [place], [address] Phone: [phone]   Fax:  [phone]  Name: Tina Bailey MRN: [id number] Date of Birth: 04/06/1945

## 2015-07-30 NOTE — Patient Instructions (Signed)
RE-ALIGNMENT ROUTINE EXERCISES-OSTEOPROROSIS BASIC FOR POSTURAL CORRECTION   RE-ALIGNMENT Tips BENEFITS: 1.It helps to re-align the curves of the back and improve standing posture. 2.It allows the back muscles to rest and strengthen in preparation for more activity. FREQUENCY: Daily, even after weeks, months and years of more advanced exercises. START: 1.All exercises start in the same position: lying on the back, arms resting on the supporting surface, palms up and slightly away from the body, backs of hands down, knees bent, feet flat. 2.The head, neck, arms, and legs are supported according to specific instructions of your therapist. Copyright  VHI. All rights reserved.    1. Decompression Exercise: Basic.   Takes compression off the vertebral bodies; increases tolerance for lying on the back; helps relieve back pain   Lie on back on firm surface, knees bent, feet flat, arms turned up, out to sides (~35 degrees). Head neck and arms supported as necessary. Time _5-15__ minutes. Surface: floor     2. Shoulder Press  Strengthens upper back extensors and scapular retractors.   Press both shoulders down. Hold _5__ seconds. Repeat _5-10_ times. Surface: floor        3. Head Press With [place]  Strengthens neck extensors   Tuck chin SLIGHTLY toward chest, keep mouth closed. Feel weight on back of head. Increase weight by pressing head down. Hold _5_ seconds. Relax. Repeat  5-10___ times. Surface: floor   Flexibility: Corner Stretch    Standing in corner with hands just above shoulder level and feet ____ inches from corner, lean forward until a comfortable stretch is felt across chest. Hold 10___ seconds. Repeat __3-5__ times per set. Do __1__ sets per session. Do _2___ sessions per day.  http://orth.exer.us/343   Copyright  VHI. All rights reserved.                                               DO's and DON'T's   Avoid and/or Minimize positions of forward bending  ( flexion)  Side bending and rotation of the trunk  Especially when movements occur together   When your back aches:   Don't sit down   Lie down on your back with a small pillow under your head and one under your knees or as outlined by our therapist. Or, lie in the 90/90 position ( on the floor with your feet and legs on the sofa with knees and hips bent to 90 degrees)  Tying or putting on your shoes:   Don't bend over to tie your shoes or put on socks.  Instead, bring one foot up, cross it over the opposite knee and bend forward (hinge) at the hips to so the task.  Keep your back straight.  If you cannot do this safely, then you need to use long handled assistive devices such as a shoehorn and sock puller.  Exercising:  Don't engage in ballistic types of exercise routines such as high-impact aerobics or jumping rope  Don't do exercises in the gym that bring you forward (abdominal crunches, sit-ups, touching your  toes, knee-to-chest, straight leg raising.)  Follow a regular exercise program that includes a variety of different weight-bearing activities, such as low-impact aerobics, T' ai chi or walking as your physical therapist advises  Do exercises that emphasize return to normal body alignment and strengthening of the muscles that keep your back straight, as  outlined in this program or by your therapist  Household tasks:  Don't reach unnecessarily or twist your trunk when mopping, sweeping, vacuuming, raking, making beds, weeding gardens, getting objects ou of cupboards, etc.  Keep your broom, mop, vacuum, or rake close to you and mover your whole body as you move them. Walk over to the area on which you are working. Arrange kitchen, bathroom, and bedroom shelves so that frequently used items may be reached without excessive bending, twisting, and reaching.  Use a sturdy stool if necessary.  Don't bend from the waist to pick up something up  Off the floor, out of the trunk of  your car, or to brush your teeth, wash your face, etc.   Bend at the knees, keeping back straight as possible. Use a reacher if necessary.   Prevention of fracture is the so-called "BOTTOm -Line" in the management of OSTEOPOROSIS. Do not take unnecessary chances in movement. Once a compression fracture occurs, the process is very difficult to control; one fracture is frequently followed by many more.  [place] [address] [place], [place] [address] Phone # [phone] Fax [phone]

## 2015-08-04 ENCOUNTER — Ambulatory Visit: Payer: Medicare Other | Admitting: Physical Therapy

## 2015-08-04 ENCOUNTER — Encounter: Payer: Self-pay | Admitting: Physical Therapy

## 2015-08-04 DIAGNOSIS — M5414 Radiculopathy, thoracic region: Secondary | ICD-10-CM | POA: Diagnosis not present

## 2015-08-04 DIAGNOSIS — M6281 Muscle weakness (generalized): Secondary | ICD-10-CM

## 2015-08-04 NOTE — Patient Instructions (Signed)
Leg Lengthener / Leg Press Combo: Single Leg    Straighten one leg down to floor. Pull toes AND forefoot toward knee; extend heel. Lengthen leg by pulling pelvis away from ribs. Press leg down. DO NOT BEND KNEE. Hold _3__ seconds. Relax leg. Repeat exercise 1 time. Relax leg. Re-bend knee. Repeat with other leg.  Copyright  VHI. All rights reserved.  Leg Press: Single    Straighten one leg down to floor. Bring toes AND forefoot toward knee, extend heel. Press leg down. DO NOT BEND KNEE. Hold _3__ seconds. Relax leg. Repeat exercise 1 time. Relax leg. Re-bend knee. Repeat with other leg. Each leg _3__ times.  Copyright  VHI. All rights reserved.  Shoulder Rotation: Double Arm    On back, knees bent, feet flat, elbows tucked at sides, bent 90, hands palms up. Pull hands apart and down toward floor, keeping elbows near sides. Hold 2 sec momentarily. Slowly return to starting position. Repeat 8___ times. Band color __yellow____   Copyright  VHI. All rights reserved.  [place] [address] [place], [place] [address] Phone # [phone] Fax [phone]

## 2015-08-04 NOTE — Therapy (Signed)
[facility] [address], [place], [place], [address] Phone: [phone]   Fax:  [phone]  Physical Therapy Treatment  Patient Details  Name: Tina Bailey MRN: [id number] Date of Birth: April 18, 1945 Referring Provider: Dr. Stark Klein  Encounter Date: 08/04/2015      PT End of Session - 08/04/15 1704    Visit Number 4   Number of Visits 10   Date for PT Re-Evaluation 08/19/15   PT Start Time 1400   PT Stop Time 1440   PT Time Calculation (min) 40 min   Activity Tolerance Other (comment)  depressed and cried   Behavior During Therapy [facility] for tasks assessed/performed      Past Medical History  Diagnosis Date  . COPD (chronic obstructive pulmonary disease) ([place])   . Productive cough   . Wears glasses   . Fibromyalgia   . Retina disorder     L eye, vision distorted, edema  . Macular degeneration     R eye  . Osteoporosis   . Spine fracture due to birth trauma   . Cancer ([place]) 12/2014    liver cancer  . Complication of anesthesia   . PONV (postoperative nausea and vomiting)     nausea,severe vomiting after 01-13-15 portal vein embolization  . TMJ disease   . Blue toe syndrome ([place]) 11/27/2014    Dr. Claudia Pollock evaluating  . Arthritis     DDD. Right shoulder"is frozen"-limited ROM. osteoporosis.  . Fracture of rib of right side     hx "osteoporosis"- states her dog nudge her on the side, next day developed great pain and was told has a fracture rib.    Past Surgical History  Procedure Laterality Date  . Partial hysterectomy    . Kidney stone surgery    . Angioplasty  2008    no stents required, no follow-up with cardiologist, no recurrent chest pain  . Cataract extraction, bilateral Bilateral   . Portal vein embolization      01-13-15 -Dr. Cathlean Sauer.  . Eye surgery      cornea surgery  . Laparoscopic partial hepatectomy N/A 03/04/2015    Procedure: DIAGNOSTIC LAPAROSCOPY, EXTENDED  RIGHT HEPATECTOMY, WITH INTRAOPERATIVE ULTRASOUND;  Surgeon: Stark Klein, MD;  [facility]: [facility];  Service: General;  Laterality: N/A;  . Esophagogastroduodenoscopy (egd) with propofol N/A 06/25/2015    Procedure: ESOPHAGOGASTRODUODENOSCOPY (EGD) WITH PROPOFOL;  Surgeon: Milus Banister, MD;  [facility];  Service: Endoscopy;  Laterality: N/A;  stent removal     There were no vitals filed for this visit.      Subjective Assessment - 08/04/15 1405    Subjective I was in [employer] and fatiqued from walking.     Pertinent History Had partial hepatectomy for liver cancer and now has pain across abdomen; surgery was in March 04, 2015 but just had a stent removed.  SOB and reports she does have COPD, but it's partly from pain and difficulty holding herself upright.  h/o compression fractures in her upper thoracic spine in 2015; has osteoporosis.  Later had fractures of L3 and L4. Fibromyalgia diagnosed some 25 years ago, and says she simply fought through the pain.  Did a regimen of daily exercise until she had her fractures, when they told her she was causing those with her exercises and having osteoporosis.  Had a left rib fracture in February; has had a CT scan and no fracture was seen at the level of her current  pain.   Patient Stated Goals you can tell me if it is muscles that I have to find to be able to straighten up and feel better   Currently in Pain? No/denies                         [facility] Adult PT Treatment/Exercise - 08/04/15 0001    Neck Exercises: Machines for Strengthening   Other Machines for Strengthening Nustep level 1 for 4 min; arm #12, seat #7   Neck Exercises: Supine   Other Supine Exercise supine shoulder ER with yellow band   Lumbar Exercises: Supine   Other Supine Lumbar Exercises decompression of spine series on mat  complain about back pain.   Manual Therapy   Manual Therapy Myofascial release;Other (comment)  craniosacral pain to cervical to  rest the nerve fibers from    Myofascial Release placed top and bottom hand on abdomen and chest to release the pain fo rht spine.                PT Education - 08/04/15 1703    Education provided Yes   Education Details leg lengthener, shoulder ER in supine   Person(s) Educated Patient   Methods Explanation;Verbal cues;Demonstration;Handout   Comprehension Returned demonstration;Verbalized understanding          PT Short Term Goals - 07/28/15 1352    PT SHORT TERM GOAL #1   Title Patient will be independent in a safe exercise program to positively affect her osteoporosis and back pain, including safe core and limb strengthening   Time 3   Period Weeks   Status New         Short Term Clinic Goals - 07/15/15 2142    CC Short Term Goal  #1   Title Patient will be independent in a safe exercise program to positively affect her osteoporosis and back pain, including safe core and limb strengthening.   Time 3   Period Weeks   Status New          PT Long Term Goals - 08/04/15 1408    PT LONG TERM GOAL #1   Title Patient will be independent in a more advance home exercise program apprpriate to her osteoporosis history   Time 3   Period Weeks   Status On-going   PT LONG TERM GOAL #2   Title Patient will report at least 50% decrease in trunk pain   Time 5   Period Weeks   Status On-going   PT LONG TERM GOAL #3   Title Patient will be knowledgeable about risk reduction related to her osteoporosis condition and compression fracture history   Time 5   Period Weeks   Status On-going           Long Term Clinic Goals - 07/15/15 2143    CC Long Term Goal  #1   Title Patient will be independent in a more advanced home exerise program appropriate to her osteoporosis history.   Time 5   Period Weeks   Status New   CC Long Term Goal  #2   Title Patient will report at least 50% decrease in trunk pain.   Time 5   Period Weeks   Status New   CC Long Term Goal  #3    Title Patient will be knowledgeable about risk reduction related to her osteoporosis condition and compression fracture history.  Plan - 08/04/15 1704    Clinical Impression Statement Patient was depressed and cried due to her not able to to as musch as she used to. Therapist worked on ways to reduce the pain by reducing the activity of nerve fibers.  Patient felt good with the nustep.  Patient has not met goals yet but progressing toward them.    Rehab Potential Good   Clinical Impairments Affecting Rehab Potential apparent COPD; osteoporosis with h/o fractures   PT Frequency 2x / week   PT Duration 4 weeks   PT Treatment/Interventions ADLs/Self Care Home Management;Cryotherapy;Electrical Stimulation;Moist Heat;DME Instruction;Functional mobility training;Therapeutic activities;Therapeutic exercise;Patient/family education;Manual techniques;Scar mobilization;Other (comment)   PT Next Visit Plan  transitional movements with abdominal control, walking program, posture; work toward discharge on 08/19/2015   PT Home Exercise Plan progress  as needed   Consulted and Agree with Plan of Care Patient      Patient will benefit from skilled therapeutic intervention in order to improve the following deficits and impairments:  Pain, Postural dysfunction, Abnormal gait, Decreased strength, Other (comment)  Visit Diagnosis: Muscle weakness (generalized)     Problem List Patient Active Problem List   Diagnosis Date Noted  . Constipation 04/26/2015  . Chronic pain 04/26/2015  . Depression 04/26/2015  . Hepatocellular adenoma 03/04/2015  . COPD GOLD III  03/03/2015  . Cigarette smoker 03/03/2015  . Cough 03/03/2015  . Liver cancer ([place]) 01/13/2015  . Hepatocellular carcinoma ([place]) 12/30/2014  . Cancer, hepatocellular ([place])   . Liver mass 12/04/2014  . Pancreatic mass 12/04/2014  . Blue toe syndrome ([place]) 11/27/2014    Earlie Counts, PT 08/04/2015 5:07 PM   [facility] [address] [place], [place], [address] Phone: [phone]   Fax:  [phone]  Name: LARENE ASCENCIO MRN: [id number] Date of Birth: Sep 12, 1945

## 2015-08-06 ENCOUNTER — Ambulatory Visit: Payer: Medicare Other

## 2015-08-06 DIAGNOSIS — M5414 Radiculopathy, thoracic region: Secondary | ICD-10-CM | POA: Diagnosis not present

## 2015-08-06 DIAGNOSIS — M6281 Muscle weakness (generalized): Secondary | ICD-10-CM

## 2015-08-06 DIAGNOSIS — M546 Pain in thoracic spine: Secondary | ICD-10-CM

## 2015-08-06 DIAGNOSIS — R293 Abnormal posture: Secondary | ICD-10-CM

## 2015-08-06 NOTE — Therapy (Signed)
[facility] [address] [place], [place], [address] Phone: [phone]   Fax:  [phone]  Physical Therapy Treatment  Patient Details  Name: Tina Bailey MRN: [id number] Date of Birth: 02-28-1946 Referring Provider: Dr. Stark Klein  Encounter Date: 08/06/2015      PT End of Session - 08/06/15 1128    Visit Number 5   Number of Visits 10   PT Start Time 1100   PT Stop Time 1138   PT Time Calculation (min) 38 min   Activity Tolerance Patient tolerated treatment well   Behavior During Therapy [facility] for tasks assessed/performed      Past Medical History  Diagnosis Date  . COPD (chronic obstructive pulmonary disease) ([place])   . Productive cough   . Wears glasses   . Fibromyalgia   . Retina disorder     L eye, vision distorted, edema  . Macular degeneration     R eye  . Osteoporosis   . Spine fracture due to birth trauma   . Cancer ([place]) 12/2014    liver cancer  . Complication of anesthesia   . PONV (postoperative nausea and vomiting)     nausea,severe vomiting after 01-13-15 portal vein embolization  . TMJ disease   . Blue toe syndrome ([place]) 11/27/2014    Dr. Claudia Pollock evaluating  . Arthritis     DDD. Right shoulder"is frozen"-limited ROM. osteoporosis.  . Fracture of rib of right side     hx "osteoporosis"- states her dog nudge her on the side, next day developed great pain and was told has a fracture rib.    Past Surgical History  Procedure Laterality Date  . Partial hysterectomy    . Kidney stone surgery    . Angioplasty  2008    no stents required, no follow-up with cardiologist, no recurrent chest pain  . Cataract extraction, bilateral Bilateral   . Portal vein embolization      01-13-15 -Dr. Cathlean Sauer.  . Eye surgery      cornea surgery  . Laparoscopic partial hepatectomy N/A 03/04/2015    Procedure: DIAGNOSTIC LAPAROSCOPY, EXTENDED RIGHT HEPATECTOMY, WITH INTRAOPERATIVE  ULTRASOUND;  Surgeon: Stark Klein, MD;  Location: [facility];  Service: General;  Laterality: N/A;  . Esophagogastroduodenoscopy (egd) with propofol N/A 06/25/2015    Procedure: ESOPHAGOGASTRODUODENOSCOPY (EGD) WITH PROPOFOL;  Surgeon: Milus Banister, MD;  Location: [facility];  Service: Endoscopy;  Laterality: N/A;  stent removal     There were no vitals filed for this visit.      Subjective Assessment - 08/06/15 1105    Subjective Feeling much better today.  Muscles are sore from new HEP.     Pertinent History Had partial hepatectomy for liver cancer and now has pain across abdomen; surgery was in March 04, 2015 but just had a stent removed.  SOB and reports she does have COPD, but it's partly from pain and difficulty holding herself upright.  h/o compression fractures in her upper thoracic spine in 2015; has osteoporosis.  Later had fractures of L3 and L4. Fibromyalgia diagnosed some 25 years ago, and says she simply fought through the pain.  Did a regimen of daily exercise until she had her fractures, when they told her she was causing those with her exercises and having osteoporosis.  Had a left rib fracture in February; has had a CT scan and no fracture was seen at the level of her current pain.   Currently in Pain?  No/denies                         [facility] Adult PT Treatment/Exercise - 08/06/15 0001    Exercises   Exercises Knee/Hip   Neck Exercises: Machines for Strengthening   Other Machines for Strengthening Nustep level 1 for  5 min; arm #10, seat #6   Knee/Hip Exercises: Standing   Hip Flexion Stengthening;Both;2 sets;10 reps   Hip Abduction Stengthening;Both;2 sets;10 reps   Hip Extension Stengthening;Both;2 sets;10 reps   Manual Therapy   Manual Therapy Soft tissue mobilization;Myofascial release   Soft tissue mobilization scar mobs to surgical incsion and elongation of surrounding structures                PT Education - 08/06/15 1116    Education  provided Yes   Education Details standing hip exercises, walking program   Person(s) Educated Patient   Methods Explanation;Demonstration;Handout   Comprehension Verbalized understanding;Returned demonstration          PT Short Term Goals - 07/28/15 1352    PT SHORT TERM GOAL #1   Title Patient will be independent in a safe exercise program to positively affect her osteoporosis and back pain, including safe core and limb strengthening   Time 3   Period Weeks   Status New         Short Term Clinic Goals - 07/15/15 2142    CC Short Term Goal  #1   Title Patient will be independent in a safe exercise program to positively affect her osteoporosis and back pain, including safe core and limb strengthening.   Time 3   Period Weeks   Status New          PT Long Term Goals - 08/04/15 1408    PT LONG TERM GOAL #1   Title Patient will be independent in a more advance home exercise program apprpriate to her osteoporosis history   Time 3   Period Weeks   Status On-going   PT LONG TERM GOAL #2   Title Patient will report at least 50% decrease in trunk pain   Time 5   Period Weeks   Status On-going   PT LONG TERM GOAL #3   Title Patient will be knowledgeable about risk reduction related to her osteoporosis condition and compression fracture history   Time 5   Period Weeks   Status On-going           Long Term Clinic Goals - 07/15/15 2143    CC Long Term Goal  #1   Title Patient will be independent in a more advanced home exerise program appropriate to her osteoporosis history.   Time 5   Period Weeks   Status New   CC Long Term Goal  #2   Title Patient will report at least 50% decrease in trunk pain.   Time 5   Period Weeks   Status New   CC Long Term Goal  #3   Title Patient will be knowledgeable about risk reduction related to her osteoporosis condition and compression fracture history.            Plan - 08/06/15 1106    Clinical Impression Statement Pt is  in improved spirits today.  PT focused on weightbearing activity to improve strength, endurance and improve bone density.  Pt is building flexibility, strength and endurance.  Pt is independent in and compliant with HEP .  Pt will benefit from skilled  PT for advancment of HEP for posture, decompression, postural strength and endurance/strength.     Rehab Potential Good   Clinical Impairments Affecting Rehab Potential apparent COPD; osteoporosis with h/o fractures   PT Frequency 2x / week   PT Duration 4 weeks   PT Treatment/Interventions ADLs/Self Care Home Management;Cryotherapy;Electrical Stimulation;Moist Heat;DME Instruction;Functional mobility training;Therapeutic activities;Therapeutic exercise;Patient/family education;Manual techniques;Scar mobilization;Other (comment)   PT Next Visit Plan Pt will be not come next week.  She is scheduled for 2x the following week.  work toward D/C to [employer] and Agree with Plan of Care Patient      Patient will benefit from skilled therapeutic intervention in order to improve the following deficits and impairments:  Pain, Postural dysfunction, Abnormal gait, Decreased strength, Other (comment)  Visit Diagnosis: Muscle weakness (generalized)  Radiculopathy, thoracic region  Pain in thoracic spine  Abnormal posture     Problem List Patient Active Problem List   Diagnosis Date Noted  . Constipation 04/26/2015  . Chronic pain 04/26/2015  . Depression 04/26/2015  . Hepatocellular adenoma 03/04/2015  . COPD GOLD III  03/03/2015  . Cigarette smoker 03/03/2015  . Cough 03/03/2015  . Liver cancer ([place]) 01/13/2015  . Hepatocellular carcinoma ([place]) 12/30/2014  . Cancer, hepatocellular ([place])   . Liver mass 12/04/2014  . Pancreatic mass 12/04/2014  . Blue toe syndrome ([place]) 11/27/2014    Sigurd Sos, PT 08/06/2015 11:39 AM  [facility] [address], [place], [place],  [address] Phone: [phone]   Fax:  [phone]  Name: Tina Bailey MRN: [id number] Date of Birth: 01/11/1946

## 2015-08-06 NOTE — Patient Instructions (Signed)
WALKING  Walking is a great form of exercise to increase your strength, endurance and overall fitness.  A walking program can help you start slowly and gradually build endurance as you go.  Everyone's ability is different, so each person's starting point will be different.  You do not have to follow them exactly.  The are just samples. You should simply find out what's right for you and stick to that program.   In the beginning, you'll start off walking 2-3 times a day for short distances.  As you get stronger, you'll be walking further at just 1-2 times per day.  A. You Can Walk For A Certain Length Of Time Each Day    Walk 5 minutes 3 times per day.  Increase 2 minutes every 2 days (3 times per day).  Work up to 25-30 minutes (1-2 times per day).   Example:   Day 1-2 5 minutes 3 times per day   Day 7-8 12 minutes 2-3 times per day   Day 13-14 25 minutes 1-2 times per day  B. You Can Walk For a Certain Distance Each Day     Distance can be substituted for time.    Example:   3 trips to mailbox (at road)   3 trips to corner of block   3 trips around the block  C. Go to local high school and use the track.    Walk for distance ____ around track  Or time ____ minutes  D. Walk ____ Jog ____ Run ___  Please only do the exercises that your therapist has initialed and dated  Knee High   Holding stable object, raise knee to hip level, then lower knee. Repeat with other knee. Complete __10_ repetitions. Do __2__ sessions per day.  ABDUCTION: Standing (Active)   Stand, feet flat. Lift right leg out to side. Use _0__ lbs. Complete __10_ repetitions. Perform __2_ sessions per day.   EXTENSION: Standing (Active)  Stand, both feet flat. Draw right leg behind body as far as possible. Use 0___ lbs. Complete 10 repetitions. Perform __2_ sessions per day.  Copyright  VHI. All rights reserved.   [place] [address], [place], [place] [address] Phone  # [phone] Fax [phone]

## 2015-08-18 ENCOUNTER — Ambulatory Visit: Payer: Medicare Other | Admitting: Physical Therapy

## 2015-08-18 ENCOUNTER — Encounter: Payer: Self-pay | Admitting: Physical Therapy

## 2015-08-18 DIAGNOSIS — M5414 Radiculopathy, thoracic region: Secondary | ICD-10-CM

## 2015-08-18 DIAGNOSIS — M6281 Muscle weakness (generalized): Secondary | ICD-10-CM

## 2015-08-18 DIAGNOSIS — M546 Pain in thoracic spine: Secondary | ICD-10-CM

## 2015-08-18 NOTE — Therapy (Addendum)
[facility] [address], [place], [place], [address] Phone: [phone]   Fax:  [phone]  Physical Therapy Treatment  Patient Details  Name: Tina Bailey MRN: [id number] Date of Birth: 1945/04/04 Referring Provider: Dr. Stark Klein  Encounter Date: 08/18/2015      PT End of Session - 08/18/15 1240    Visit Number 6   Number of Visits 10   Date for PT Re-Evaluation 08/19/15   PT Start Time 1234   PT Stop Time 1312   PT Time Calculation (min) 38 min   Activity Tolerance Patient limited by pain   Behavior During Therapy [facility] for tasks assessed/performed      Past Medical History  Diagnosis Date  . COPD (chronic obstructive pulmonary disease) ([place])   . Productive cough   . Wears glasses   . Fibromyalgia   . Retina disorder     L eye, vision distorted, edema  . Macular degeneration     R eye  . Osteoporosis   . Spine fracture due to birth trauma   . Cancer ([place]) 12/2014    liver cancer  . Complication of anesthesia   . PONV (postoperative nausea and vomiting)     nausea,severe vomiting after 01-13-15 portal vein embolization  . TMJ disease   . Blue toe syndrome ([place]) 11/27/2014    Dr. Claudia Pollock evaluating  . Arthritis     DDD. Right shoulder"is frozen"-limited ROM. osteoporosis.  . Fracture of rib of right side     hx "osteoporosis"- states her dog nudge her on the side, next day developed great pain and was told has a fracture rib.    Past Surgical History  Procedure Laterality Date  . Partial hysterectomy    . Kidney stone surgery    . Angioplasty  2008    no stents required, no follow-up with cardiologist, no recurrent chest pain  . Cataract extraction, bilateral Bilateral   . Portal vein embolization      01-13-15 -Dr. Cathlean Sauer.  . Eye surgery      cornea surgery  . Laparoscopic partial hepatectomy N/A 03/04/2015    Procedure: DIAGNOSTIC LAPAROSCOPY, EXTENDED RIGHT  HEPATECTOMY, WITH INTRAOPERATIVE ULTRASOUND;  Surgeon: Stark Klein, MD;  [facility]: [facility];  Service: General;  Laterality: N/A;  . Esophagogastroduodenoscopy (egd) with propofol N/A 06/25/2015    Procedure: ESOPHAGOGASTRODUODENOSCOPY (EGD) WITH PROPOFOL;  Surgeon: Milus Banister, MD;  [facility];  Service: Endoscopy;  Laterality: N/A;  stent removal     There were no vitals filed for this visit.      Subjective Assessment - 08/18/15 1236    Subjective I had pain from my spine that goes from the back to my front.  I am taking my hydrocodone due to pain so bad.    Pertinent History Had partial hepatectomy for liver cancer and now has pain across abdomen; surgery was in March 04, 2015 but just had a stent removed.  SOB and reports she does have COPD, but it's partly from pain and difficulty holding herself upright.  h/o compression fractures in her upper thoracic spine in 2015; has osteoporosis.  Later had fractures of L3 and L4. Fibromyalgia diagnosed some 25 years ago, and says she simply fought through the pain.  Did a regimen of daily exercise until she had her fractures, when they told her she was causing those with her exercises and having osteoporosis.  Had a left rib fracture in February; has had  a CT scan and no fracture was seen at the level of her current pain.   Patient Stated Goals you can tell me if it is muscles that I have to find to be able to straighten up and feel better   Currently in Pain? Yes   Pain Score 7   with medicine   Pain Location Abdomen   Pain Orientation Upper   Pain Descriptors / Indicators Spasm;Shooting  takes patient breath away   Pain Type Surgical pain   Pain Onset More than a month ago   Pain Frequency Constant   Aggravating Factors  breathing, sititng upright, standing to sitting   Pain Relieving Factors medication   Multiple Pain Sites No            [facility] PT Assessment - 08/18/15 0001    Assessment   Medical Diagnosis s/p partial  hepactectomy for liver cancer   Onset Date/Surgical Date 03/04/15   Precautions   Precautions Fall   Precaution Comments significant osteoporosis with h/o multiple fractures; cancer precautions   Restrictions   Weight Bearing Restrictions No   Balance Screen   Has the patient fallen in the past 6 months No   Has the patient had a decrease in activity level because of a fear of falling?  No   Is the patient reluctant to leave their home because of a fear of falling?  No   Prior Function   Vocation Retired   Leisure no current exercise; did exercise previously but had to stop   Cognition   Overall Cognitive Status Within Functional Limits for tasks assessed                     [facility] Adult PT Treatment/Exercise - 08/18/15 0001    Manual Therapy   Manual Therapy Soft tissue mobilization;Myofascial release   Soft tissue mobilization scar mobs to surgical incsion and elongation of surrounding structures; soft tissue work to right diaphgram   Myofascial Release tissue rolling along the scar                PT Education - 08/18/15 1317    Education provided Yes   Education Details discussed with patient to see her primary care doctor due to the pain in her back that radiates to the abdominal wall   Person(s) Educated Patient   Methods Explanation   Comprehension Verbalized understanding          PT Short Term Goals - 08/18/15 1317    PT SHORT TERM GOAL #1   Title Patient will be independent in a safe exercise program to positively affect her osteoporosis and back pain, including safe core and limb strengthening   Time 3   Period Weeks   Status Achieved         Short Term Clinic Goals - 07/15/15 2142    CC Short Term Goal  #1   Title Patient will be independent in a safe exercise program to positively affect her osteoporosis and back pain, including safe core and limb strengthening.   Time 3   Period Weeks   Status New          PT Long Term Goals -  08/18/15 1318    PT LONG TERM GOAL #1   Title Patient will be independent in a more advance home exercise program apprpriate to her osteoporosis history   Time 3   Period Weeks   Status Achieved   PT LONG TERM GOAL #2  Title Patient will report at least 50% decrease in trunk pain   Time 5   Period Weeks   Status Not Met   PT LONG TERM GOAL #3   Title Patient will be knowledgeable about risk reduction related to her osteoporosis condition and compression fracture history   Time 5   Period Weeks   Status Achieved           Long Term Clinic Goals - 07/15/15 2143    CC Long Term Goal  #1   Title Patient will be independent in a more advanced home exerise program appropriate to her osteoporosis history.   Time 5   Period Weeks   Status New   CC Long Term Goal  #2   Title Patient will report at least 50% decrease in trunk pain.   Time 5   Period Weeks   Status New   CC Long Term Goal  #3   Title Patient will be knowledgeable about risk reduction related to her osteoporosis condition and compression fracture history.            Plan - September 01, 2015 1319    Clinical Impression Statement Patient is independent with her osteoporosis program.  Patient has improved moblity of her scar on the anterior abdomen. Patient is having intense pain that starts from the thoracic spine to the abdomen. Patient reports the pain is 7/10  and takes her breath away that started on Sunday.  Patient was lifitng heavy clippers to trip bushes.Patients pain is worse with transitional movements.  Patient was recommeded to go  see her primary care doctor assess the pain.  Patient is beieng discharged for the osteoporosis part of therapy.    Rehab Potential Good   Clinical Impairments Affecting Rehab Potential apparent COPD; osteoporosis with h/o fractures   PT Treatment/Interventions ADLs/Self Care Home Management;Cryotherapy;Electrical Stimulation;Moist Heat;DME Instruction;Functional mobility  training;Therapeutic activities;Therapeutic exercise;Patient/family education;Manual techniques;Scar mobilization;Other (comment)   PT Next Visit Plan Discharge to HEP for osteroporosis and to see MD for intense back pain   PT Home Exercise Plan current HEP   Consulted and Agree with Plan of Care Patient      Patient will benefit from skilled therapeutic intervention in order to improve the following deficits and impairments:  Pain, Postural dysfunction, Abnormal gait, Decreased strength, Other (comment)  Visit Diagnosis: Muscle weakness (generalized)  Radiculopathy, thoracic region  Pain in thoracic spine       G-Codes - Sep 01, 2015 1314    Functional Assessment Tool Used clinical judgement   Functional Limitation Self care  related to osteoporosis   Self Care Goal Status (R6789) At least 1 percent but less than 20 percent impaired, limited or restricted   Self Care Discharge Status [phone]) At least 1 percent but less than 20 percent impaired, limited or restricted      Problem List Patient Active Problem List   Diagnosis Date Noted  . Constipation 04/26/2015  . Chronic pain 04/26/2015  . Depression 04/26/2015  . Hepatocellular adenoma 03/04/2015  . COPD GOLD III  03/03/2015  . Cigarette smoker 03/03/2015  . Cough 03/03/2015  . Liver cancer ([place]) 01/13/2015  . Hepatocellular carcinoma ([place]) 12/30/2014  . Cancer, hepatocellular ([place])   . Liver mass 12/04/2014  . Pancreatic mass 12/04/2014  . Blue toe syndrome ([place]) 11/27/2014    Earlie Counts, PT 09/01/2015 2:52 PM   [facility] [address] [place], [place], [address] Phone: [phone]   Fax:  [phone] PHYSICAL  THERAPY DISCHARGE SUMMARY  Visits from Start of Care: 6  Current functional level related to goals / functional outcomes: Patient has met all of her osteoporosis goals but started to have intense thoracic pain last Sunday after trimming  bushes. Patient has been sent to see Dr. Doyle Askew to be assessed.   Remaining deficits: See above   Education / Equipment: HEP  Plan: Patient agrees to discharge.  Patient goals were partially met. Patient is being discharged due to a change in medical status. Please assess patient for intense thoracic pain. Thank you for the referral. Earlie Counts, PT 08/18/2015 2:52 PM   ?????      Name: FOY MUNGIA MRN: [id number] Date of Birth: 1945-12-10

## 2015-08-20 ENCOUNTER — Encounter: Payer: Medicare Other | Admitting: Physical Therapy

## 2015-09-07 ENCOUNTER — Other Ambulatory Visit (HOSPITAL_BASED_OUTPATIENT_CLINIC_OR_DEPARTMENT_OTHER): Payer: Medicare Other

## 2015-09-07 DIAGNOSIS — C220 Liver cell carcinoma: Secondary | ICD-10-CM

## 2015-09-07 LAB — CBC WITH DIFFERENTIAL/PLATELET
BASO%: 0.8 % (ref 0.0–2.0)
Basophils Absolute: 0.1 10*3/uL (ref 0.0–0.1)
EOS%: 0.8 % (ref 0.0–7.0)
Eosinophils Absolute: 0.1 10*3/uL (ref 0.0–0.5)
HCT: 42.2 % (ref 34.8–46.6)
HGB: 14.3 g/dL (ref 11.6–15.9)
LYMPH%: 31.4 % (ref 14.0–49.7)
MCH: 30.2 pg (ref 25.1–34.0)
MCHC: 33.9 g/dL (ref 31.5–36.0)
MCV: 89.2 fL (ref 79.5–101.0)
MONO#: 0.7 10*3/uL (ref 0.1–0.9)
MONO%: 9.7 % (ref 0.0–14.0)
NEUT#: 4.1 10*3/uL (ref 1.5–6.5)
NEUT%: 57.3 % (ref 38.4–76.8)
Platelets: 275 10*3/uL (ref 145–400)
RBC: 4.73 10*6/uL (ref 3.70–5.45)
RDW: 14.6 % — ABNORMAL HIGH (ref 11.2–14.5)
WBC: 7.1 10*3/uL (ref 3.9–10.3)
lymph#: 2.2 10*3/uL (ref 0.9–3.3)

## 2015-09-07 LAB — COMPREHENSIVE METABOLIC PANEL
ALT: 18 U/L (ref 0–55)
AST: 19 U/L (ref 5–34)
Albumin: 3.8 g/dL (ref 3.5–5.0)
Alkaline Phosphatase: 114 U/L (ref 40–150)
Anion Gap: 5 mEq/L (ref 3–11)
BUN: 12.2 mg/dL (ref 7.0–26.0)
CO2: 30 mEq/L — ABNORMAL HIGH (ref 22–29)
Calcium: 9.4 mg/dL (ref 8.4–10.4)
Chloride: 99 mEq/L (ref 98–109)
Creatinine: 0.7 mg/dL (ref 0.6–1.1)
EGFR: 89 mL/min/{1.73_m2} — ABNORMAL LOW (ref >90)
Glucose: 101 mg/dl (ref 70–140)
Potassium: 5.1 mEq/L (ref 3.5–5.1)
Sodium: 135 mEq/L — ABNORMAL LOW (ref 136–145)
Total Bilirubin: 0.39 mg/dL (ref 0.20–1.20)
Total Protein: 7.4 g/dL (ref 6.4–8.3)

## 2015-09-08 LAB — ALPHA FETO PROTEIN (PARALLEL TESTING): AFP-Tumor Marker: 2.5 ng/mL (ref <6.1)

## 2015-09-08 LAB — AFP TUMOR MARKER: AFP, Serum, Tumor Marker: 2.7 ng/mL (ref 0.0–8.3)

## 2015-09-14 ENCOUNTER — Ambulatory Visit (HOSPITAL_BASED_OUTPATIENT_CLINIC_OR_DEPARTMENT_OTHER): Payer: Medicare Other | Admitting: Hematology

## 2015-09-14 ENCOUNTER — Encounter: Payer: Self-pay | Admitting: Hematology

## 2015-09-14 ENCOUNTER — Telehealth: Payer: Self-pay | Admitting: Hematology

## 2015-09-14 VITALS — BP 148/53 | HR 84 | Temp 98.2°F | Resp 18 | Ht 66.0 in | Wt 111.6 lb

## 2015-09-14 DIAGNOSIS — K869 Disease of pancreas, unspecified: Secondary | ICD-10-CM | POA: Diagnosis not present

## 2015-09-14 DIAGNOSIS — Z720 Tobacco use: Secondary | ICD-10-CM

## 2015-09-14 DIAGNOSIS — C220 Liver cell carcinoma: Secondary | ICD-10-CM

## 2015-09-14 DIAGNOSIS — J449 Chronic obstructive pulmonary disease, unspecified: Secondary | ICD-10-CM

## 2015-09-14 NOTE — Telephone Encounter (Signed)
Gave pt cal & avs °

## 2015-09-14 NOTE — Progress Notes (Signed)
[place]  [phone] [phone]  Clinic follow Up Note   Patient Care Team: Orpah Melter, MD as PCP - General (Family Medicine) Elam Dutch, MD as Consulting Physician (Vascular Surgery) 09/14/2015   CHIEF COMPLAINTS:  Hepatocellular carcinoma  Oncology History   Hepatocellular carcinoma ([place])   Staging form: Liver (Excluding Intrahepatic Bile Ducts), AJCC 7th Edition     Clinical stage from 12/30/2014: Stage Unknown (T1, NX, M0) - Unsigned       Hepatocellular carcinoma ([place])   12/02/2014 Imaging Liver MRI showed a large lesion in segment 5 and 40, highly suspicious for malignancy. And a 1.2 cm indeterminate pancreatic body lesion. Mildly enlarged portocaval lymph node. Cholelithiasis.   12/19/2014 Initial Diagnosis Hepatocellular carcinoma ([place])   12/19/2014 Initial Biopsy Liver mass biopsy showed parathyroid carcinoma, positive for hep par 1, cytokeratin 8/18, cytokeratin 7 and MOC-31.   12/25/2014 Imaging CT chest showed a 3 mm groundglass probably nodule in the right upper lobe, mild right pericardial phrenic lymphadenopathy, no definitive evidence of metastasis.   01/13/2015 Procedure Liquid embolization of the right portal venous system by Interventional Radiologist Dr. Laurence Ferrari   03/04/2015 Surgery Liver right lobe hepatectomy, and the portal lymph nodes biopsy   03/04/2015 Surgery Hepatocellular carcinoma, poorly differentiated, spanning 8.5 cm, surgical margins were negative, lymphovascular invasion is identified, for portal lymph nodes were negative.   HISTORY OF PRESENTING ILLNESS:  Tina Bailey 70 y.o. female is here because of recently discovered liver mass.  She has had abdominal pain after meals for the past 4 years, occurs once every several months, usually resolves after 20-30 mins on its own. She was seen by her PCP, had [place] and her symptoms was felt to be related to her gall bladder stone.   She has had some  pressure feeling under right ribcage, which radiates to back, for the past 8-9 months, and slight decrease her appetite, and fatigue, she lost about 10 lbs.   She went to see Dr. Oneida Alar for left 5th toes color change (blue), no claudication, she underwent CT angiogram which incidentally showed a large liver mass and a 1.2 cm pancreatic cystic lesion. She was referred to [place] for further management.   She never had colonoscopy. She denies melena, and she is here, change of her bowel habit. Se lives with her daughter. She has extensive smoking history, no family history of malignancy.  CURRENT THERAPY: Surveillance   INTERIM HISTROY Tina Bailey returns for follow-up. She developed T11 compression fracture one month ago, no prior fall or injury, she was seen by orthopedic surgeon Dr. Rolena Infante and has been on thoracic brace since then, she may need kyphoplasty. She otherwise is doing well, denies any abdominal pain, bloating, nausea or other symptoms. Her bowel movements normal. Her appetite and energy level are good.  MEDICAL HISTORY:  Past Medical History  Diagnosis Date  . COPD (chronic obstructive pulmonary disease) ([place])   . Productive cough   . Wears glasses   . Fibromyalgia   . Retina disorder     L eye, vision distorted, edema  . Macular degeneration     R eye  . Osteoporosis   . Spine fracture due to birth trauma   . Cancer ([place]) 12/2014    liver cancer  . Complication of anesthesia   . PONV (postoperative nausea and vomiting)     nausea,severe vomiting after 01-13-15 portal vein embolization  . TMJ disease   . Blue toe syndrome ([place]) 11/27/2014  Dr. Claudia Pollock evaluating  . Arthritis     DDD. Right shoulder"is frozen"-limited ROM. osteoporosis.  . Fracture of rib of right side     hx "osteoporosis"- states her dog nudge her on the side, next day developed great pain and was told has a fracture rib.    SURGICAL HISTORY: Past Surgical History  Procedure Laterality Date  .  Partial hysterectomy    . Kidney stone surgery    . Angioplasty  2008    no stents required, no follow-up with cardiologist, no recurrent chest pain  . Cataract extraction, bilateral Bilateral   . Portal vein embolization      01-13-15 -Dr. Cathlean Sauer.  . Eye surgery      cornea surgery  . Laparoscopic partial hepatectomy N/A 03/04/2015    Procedure: DIAGNOSTIC LAPAROSCOPY, EXTENDED RIGHT HEPATECTOMY, WITH INTRAOPERATIVE ULTRASOUND;  Surgeon: Stark Klein, MD;  Location: [facility];  Service: General;  Laterality: N/A;  . Esophagogastroduodenoscopy (egd) with propofol N/A 06/25/2015    Procedure: ESOPHAGOGASTRODUODENOSCOPY (EGD) WITH PROPOFOL;  Surgeon: Milus Banister, MD;  Location: [facility];  Service: Endoscopy;  Laterality: N/A;  stent removal     SOCIAL HISTORY: Social History   Social History  . Marital Status: Divorced    Spouse Name: N/A  . Number of Children: 3   . Years of Education: N/A   Occupational History  . 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 Smoking status: Current Every Day Smoker -- 1.00 packs/day for 45 years    Types: Cigarettes  . Smokeless tobacco: Never Used  . Alcohol Use: No  . Drug Use: No  . Sexual Activity: Not on file   Other Topics Concern  . Not on file   Social History Narrative    FAMILY HISTORY: Family History  Problem Relation Age of Onset  . Hypertension Mother   . Atrial fibrillation Mother   . Heart disease Mother     after age 29  . Stroke Father   . Dementia Father   . Heart disease Brother     After age 36- A-Fib  . Heart attack Brother     ALLERGIES:  is allergic to amoxicillin-pot clavulanate; paroxetine hcl; and sulfa antibiotics.  MEDICATIONS:  Current Outpatient Prescriptions  Medication Sig Dispense Refill  . carboxymethylcellulose (REFRESH TEARS) 0.5 % SOLN Place 1-2 drops into both eyes 3 (three) times daily as needed (for dry eyes).     Marland Kitchen  HYDROcodone-acetaminophen (NORCO/VICODIN) 5-325 MG tablet Take 1 tablet by mouth every 4 (four) hours as needed for moderate pain or severe pain. (Patient taking differently: Take 1 tablet by mouth every 8 (eight) hours as needed for moderate pain or severe pain. ) 30 tablet 0  . Multiple Vitamin (MULTIVITAMIN WITH MINERALS) TABS tablet Take 1 tablet by mouth daily.    . naproxen (NAPROSYN) 375 MG tablet Take 1 tablet (375 mg total) by mouth 2 (two) times daily. (Patient not taking: Reported on 07/15/2015) 20 tablet 0  . orphenadrine (NORFLEX) 100 MG tablet Take 1 tablet (100 mg total) by mouth 2 (two) times daily. 30 tablet 0   No current facility-administered medications for this visit.    REVIEW OF SYSTEMS:   Constitutional: Denies fevers, chills or abnormal night sweats, (+) fatigue and 10 lb weight loss  Eyes: Denies blurriness of vision, double vision or watery eyes Ears, nose, mouth, throat, and face: Denies mucositis or sore throat Respiratory: Denies  cough, dyspnea or wheezes Cardiovascular: Denies palpitation, chest discomfort or lower extremity swelling Gastrointestinal:  Denies nausea, heartburn or change in bowel habits, se HPI  Skin: Denies abnormal skin rashes Lymphatics: Denies new lymphadenopathy or easy bruising Neurological:Denies numbness, tingling or new weaknesses Behavioral/Psych: Mood is stable, no new changes  All other systems were reviewed with the patient and are negative.  PHYSICAL EXAMINATION: ECOG PERFORMANCE STATUS: 2  Filed Vitals:   09/14/15 1410  BP: 148/53  Pulse: 84  Temp: 98.2 F (36.8 C)  Resp: 18   Filed Weights   09/14/15 1410  Weight: 111 lb 9.6 oz (50.621 kg)    GENERAL:alert, no distress and comfortable, she wear a thoracic spine brace  SKIN: skin color, texture, turgor are normal, no rashes or significant lesions EYES: normal, conjunctiva are pink and non-injected, sclera clear OROPHARYNX:no exudate, no erythema and lips, buccal  mucosa, and tongue normal  NECK: supple, thyroid normal size, non-tender, without nodularity LYMPH:  no palpable lymphadenopathy in the cervical, axillary or inguinal LUNGS: clear to auscultation and percussion with normal breathing effort HEART: regular rate & rhythm and no murmurs and no lower extremity edema ABDOMEN:abdomen soft, non-tender and normal bowel sounds Musculoskeletal:no cyanosis of digits and no clubbing  PSYCH: alert & oriented x 3 with fluent speech NEURO: no focal motor/sensory deficits  LABORATORY DATA:  I have reviewed the data as listed CBC Latest Ref Rng 09/07/2015 06/10/2015 06/09/2015  WBC 3.9 - 10.3 10e3/uL 7.1 9.2 9.0  Hemoglobin 11.6 - 15.9 g/dL 14.3 13.2 13.8  Hematocrit 34.8 - 46.6 % 42.2 39.1 42.6  Platelets 145 - 400 10e3/uL 275 248 272    CMP Latest Ref Rng 09/07/2015 06/10/2015 06/09/2015  Glucose 70 - 140 mg/dl 101 143(H) 88  BUN 7.0 - 26.0 mg/dL 12.2 13 13.7  Creatinine 0.6 - 1.1 mg/dL 0.7 0.52 0.7  Sodium 136 - 145 mEq/L 135(L) 136 138  Potassium 3.5 - 5.1 mEq/L 5.1 4.2 4.4  Chloride 101 - 111 mmol/L - 99(L) -  CO2 22 - 29 mEq/L 30(H) 29 28  Calcium 8.4 - 10.4 mg/dL 9.4 9.3 9.6  Total Protein 6.4 - 8.3 g/dL 7.4 7.5 7.7  Total Bilirubin 0.20 - 1.20 mg/dL 0.39 1.0 0.43  Alkaline Phos 40 - 150 U/L 114 225(H) 251(H)  AST 5 - 34 U/L 19 34 34  ALT 0 - 55 U/L 18 28 26    AFP tumor marker  Status: Finalresult Visible to patient:  Not Released Nextappt: 09/07/2015 at 11:30 AM in [facility]) Dx:  Hepatocellular carcinoma ([place])         Ref Range 5d ago    AFP, Serum, Tumor Marker 0.0 - 8.3 ng/mL 4.1         PATHOLOGY REPORT  Diagnosis 03/04/2015 1. Liver, biopsy, left sided nodule - BENIGN HEPATIC PARENCHYMA. - THERE IS NO EVIDENCE OF MALIGNANCY. 2. Liver, biopsy, posterior sided nodule - BENIGN HEPATIC PARENCHYMA WITH ASSOCIATED FIBROUS TISSUE. - THERE IS NO EVIDENCE OF MALIGNANCY. 3. Lymph node, biopsy,  portal - THERE IS NO EVIDENCE OF CARCINOMA IN 3 OF 3 LYMPH NODES (0/3). 4. Lymph node, biopsy, posterior portal - THERE IS NO EVIDENCE OF CARCINOMA IN 1 OF 1 LYMPH NODE (0/1). 5. Liver, hepatectomy, right lobe - HEPATOCELLULAR CARCINOMA, POORLY DIFFERENTIATED, SPANNING 8.5 CM. - THE SURGICAL RESECTION MARGINS ARE NEGATIVE FOR CARCINOMA. - LYMPHOVASCULAR INVASION IS IDENTIFIED. - SEE ONCOLOGY TABLE BELOW. OTHER FINDINGS: - BENIGN HEPATIC PARENCHYMA WITH MILD PORTAL CHRONIC INFLAMMATION AND  DILATED VASCULATURE. - GALLBLADDER WITH MILD CHRONIC CHOLECYSTITIS AND CHOLELITHIASIS.  RADIOGRAPHIC STUDIES: I have personally reviewed the radiological images as listed and agreed with the findings in the report.  Mr Liver W Wo Contrast 12/02/2014   IMPRESSION: 1. The first lesion of concern is a fairly vascular mass spanning segments 5 and 4B of the liver with capsule or pseudocapsule appearance, and central enhancement which spreads peripherally on the later images, and also with associated hepatic capsular retraction. Differential diagnostic considerations for this lesion include peripheral cholangiocarcinoma ; hepatic epitheliod hemangioendothelioma ; hepatocellular carcinoma ; or treated metastatic disease. Biopsy is likely warranted. 2. The second lesion of concern is a 1.2 cm pancreatic body lesion with high T2 and low T1 signal characteristics, questionable attachment to the dorsal pancreatic duct, and some internal septation and probably some faint nodular enhancement internally. The appearance on CT, for example image 62 series 9, is somewhat more concerning for internal enhancement, although measurable enhancement is present on MRI. This could be a small macro cystic tumor with solid component; the primary ductal pancreatic adenocarcinoma with cystic degeneration; were cystic degeneration of an islet cell tumor such as insulinoma or glucagon adenoma. Metastatic disease to the pancreas could possibly  appear this way. Intraductal papillary mucinous neoplasm with small solid component could also appear this way. Biopsy, surveillance, or further imaging characterization with nuclear medicine PET-CT may be warranted. 3. Cholelithiasis. 4. Mild biliary dilatation. 5. Mildly enlarged portacaval lymph node. In the setting of the hepatic and pancreatic lesions the appearance raises concern for the possibility of early local metastatic disease. This could also be further characterized at PET-CT.   Electronically Signed   By: Van Clines M.D.   On: 12/02/2014 08:39   CT chest w contrast 12/25/2014 IMPRESSION: 1. Solitary 3 mm ground-glass pulmonary nodule in the right upper lobe. Recommend attention on follow-up chest CT in 3 months. 2. Mild right pericardiophrenic lymphadenopathy, possibly metastatic. 3. Mild centrilobular emphysema. 4. Nondisplaced healing subacute lateral left fifth rib fracture. 5. Right thyroid lobe 1.8 cm nodule. Recommend correlation with thyroid ultrasound. This follows ACR consensus guidelines: Managing Incidental Thyroid Nodules Detected on Imaging: White Paper of the ACR Incidental Thyroid Findings Committee. J Am Coll Radiol 2015; 12:143-150   ASSESSMENT & PLAN: 70 year old African-American female, with 45-pack-year smoking history, presented with incidental scan finding of a large peripheral liver lesion in the right lobe segmental 5 and a 4B, and a small pancreatic cystic lesion.  1. Hepatocellular carcinoma, [id number], stage IIIB, poorly differentiated  -I reviewed her surgical pathology findings with her in details. Her surgical margins were negative, she had a complete resection -No role for adjuvant therapy is needed.  -We discussed the risk of cancer recurrence. Giving the locally advanced stage, she is certainly at high risk for recurrence, especially local recurrence. -She is clinically doing well, physical exam was unremarkable, lab reviewed, her recent  liver function and AFP are within normal limits. No clinical concern for recurrence. -We discussed the surveillance for cancer recurrence after her surgery. I'll see her every 3-4 months with lab including AFP, and repeated surveillance CT scan every 6-12 months, for total 5 years -I'll see her back in 3 weeks with lab and a repeated liver MRI before visit.   2. Pancreatic lesion -Abdominal MRI showed a 1.2 cm pancreatic body lesion, likely cystic with some solid component.  -Her imaging was reviewed in our tumor before, radiologist feels they pancreatic lesion is less likely malignancy. -We'll follow up clinically.  3. COPD and smoking cessation  -She will continue to follow-up with her primary care physician -I discussed the smoking cessation extensively. She is willing to cut back, not ready for crit completely yet.  4. T11 compression fracture -she will continue follow up with her orthopedic surgeon -She is on thoracic spine brace, may need kyphoplasty   Plan -I'll see her back in 3 months for follow-up, with lab and liver MRI one week before he  All questions were answered. The patient knows to call the clinic with any problems, questions or concerns. I spent 20 minutes counseling the patient face to face. The total time spent in the appointment was 25 minutes and more than 50% was on counseling.     Truitt Merle, MD 09/14/2015

## 2015-10-14 ENCOUNTER — Other Ambulatory Visit: Payer: Self-pay | Admitting: General Surgery

## 2015-10-14 DIAGNOSIS — R1011 Right upper quadrant pain: Secondary | ICD-10-CM

## 2015-10-15 LAB — HM DEXA SCAN

## 2015-10-27 ENCOUNTER — Ambulatory Visit
Admission: RE | Admit: 2015-10-27 | Discharge: 2015-10-27 | Disposition: A | Discharge status: Home / Self Care | Payer: Medicare Other | Source: Ambulatory Visit | Attending: General Surgery | Admitting: General Surgery

## 2015-10-27 DIAGNOSIS — R1011 Right upper quadrant pain: Secondary | ICD-10-CM

## 2015-10-27 MED ORDER — GADOXETATE DISODIUM 0.25 MMOL/ML IV SOLN
6.0000 mL | Freq: Once | INTRAVENOUS | Status: DC | PRN
Start: 2015-10-27 — End: 2015-10-28

## 2015-10-28 NOTE — Progress Notes (Signed)
Please let patient know MR looks OK.  No evidence of recurrent cancer or hernia.

## 2015-11-27 ENCOUNTER — Telehealth: Payer: Self-pay | Admitting: *Deleted

## 2015-11-27 NOTE — Telephone Encounter (Signed)
Pt called requesting to cancel MRI appt for Mon 9/11 due to pt has had new fractures from osteoporosis.  Stated she would be unable to lie flat for procedure.   Noted that pt had MRI Abdomen done 10/27/15 by Dr. Barry Dienes.   Dr. Burr Medico reviewed results.  Spoke with pt and informed pt that she would not need another MRI.   Lab appt cancelled for 9/11.   Gave pt lab appt for 9/18 prior to office visit with Dr. Burr Medico.   Pt voiced understanding. Pt's    Phone     [phone].

## 2015-11-30 ENCOUNTER — Other Ambulatory Visit: Payer: Medicare Other

## 2015-12-07 ENCOUNTER — Ambulatory Visit (HOSPITAL_BASED_OUTPATIENT_CLINIC_OR_DEPARTMENT_OTHER): Payer: Medicare Other | Admitting: Hematology

## 2015-12-07 ENCOUNTER — Telehealth: Payer: Self-pay | Admitting: Hematology

## 2015-12-07 ENCOUNTER — Other Ambulatory Visit (HOSPITAL_BASED_OUTPATIENT_CLINIC_OR_DEPARTMENT_OTHER): Payer: Medicare Other

## 2015-12-07 ENCOUNTER — Ambulatory Visit: Payer: Medicare Other | Admitting: Hematology

## 2015-12-07 VITALS — BP 137/65 | HR 86 | Temp 98.1°F | Resp 18 | Ht 66.0 in | Wt 111.3 lb

## 2015-12-07 DIAGNOSIS — C220 Liver cell carcinoma: Secondary | ICD-10-CM

## 2015-12-07 DIAGNOSIS — R1011 Right upper quadrant pain: Secondary | ICD-10-CM

## 2015-12-07 DIAGNOSIS — Z720 Tobacco use: Secondary | ICD-10-CM

## 2015-12-07 DIAGNOSIS — R0789 Other chest pain: Secondary | ICD-10-CM

## 2015-12-07 DIAGNOSIS — M549 Dorsalgia, unspecified: Secondary | ICD-10-CM | POA: Diagnosis not present

## 2015-12-07 DIAGNOSIS — J449 Chronic obstructive pulmonary disease, unspecified: Secondary | ICD-10-CM

## 2015-12-07 LAB — CBC WITH DIFFERENTIAL/PLATELET
BASO%: 0.7 % (ref 0.0–2.0)
Basophils Absolute: 0.1 10*3/uL (ref 0.0–0.1)
EOS%: 0.7 % (ref 0.0–7.0)
Eosinophils Absolute: 0.1 10*3/uL (ref 0.0–0.5)
HCT: 41.1 % (ref 34.8–46.6)
HGB: 13.5 g/dL (ref 11.6–15.9)
LYMPH%: 33.7 % (ref 14.0–49.7)
MCH: 29.5 pg (ref 25.1–34.0)
MCHC: 32.8 g/dL (ref 31.5–36.0)
MCV: 89.8 fL (ref 79.5–101.0)
MONO#: 0.6 10*3/uL (ref 0.1–0.9)
MONO%: 8.4 % (ref 0.0–14.0)
NEUT#: 4.1 10*3/uL (ref 1.5–6.5)
NEUT%: 56.5 % (ref 38.4–76.8)
Platelets: 252 10*3/uL (ref 145–400)
RBC: 4.57 10*6/uL (ref 3.70–5.45)
RDW: 15 % — ABNORMAL HIGH (ref 11.2–14.5)
WBC: 7.2 10*3/uL (ref 3.9–10.3)
lymph#: 2.4 10*3/uL (ref 0.9–3.3)

## 2015-12-07 LAB — COMPREHENSIVE METABOLIC PANEL
ALT: 13 U/L (ref 0–55)
AST: 19 U/L (ref 5–34)
Albumin: 3.7 g/dL (ref 3.5–5.0)
Alkaline Phosphatase: 107 U/L (ref 40–150)
Anion Gap: 6 mEq/L (ref 3–11)
BUN: 13.2 mg/dL (ref 7.0–26.0)
CO2: 30 mEq/L — ABNORMAL HIGH (ref 22–29)
Calcium: 10 mg/dL (ref 8.4–10.4)
Chloride: 102 mEq/L (ref 98–109)
Creatinine: 0.6 mg/dL (ref 0.6–1.1)
EGFR: 90 mL/min/{1.73_m2} — ABNORMAL LOW (ref >90)
Glucose: 106 mg/dl (ref 70–140)
Potassium: 5 mEq/L (ref 3.5–5.1)
Sodium: 138 mEq/L (ref 136–145)
Total Bilirubin: 0.54 mg/dL (ref 0.20–1.20)
Total Protein: 7.1 g/dL (ref 6.4–8.3)

## 2015-12-07 NOTE — Telephone Encounter (Signed)
Avs report and appointment schedule given to patient, per 12/07/15 los. °

## 2015-12-07 NOTE — Progress Notes (Signed)
[place]  [phone] [phone]) [phone]  Clinic follow Up Note   Patient Care Team: Orpah Melter, MD as PCP - General (Family Medicine) Elam Dutch, MD as Consulting Physician (Vascular Surgery) 12/07/2015   CHIEF COMPLAINTS:  Hepatocellular carcinoma  Oncology History   Hepatocellular carcinoma ([place])   Staging form: Liver (Excluding Intrahepatic Bile Ducts), AJCC 7th Edition     Clinical stage from 12/30/2014: Stage Unknown (T1, NX, M0) - Unsigned       Hepatocellular carcinoma ([place])   12/02/2014 Imaging    Liver MRI showed a large lesion in segment 5 and 40, highly suspicious for malignancy. And a 1.2 cm indeterminate pancreatic body lesion. Mildly enlarged portocaval lymph node. Cholelithiasis.      12/19/2014 Initial Diagnosis    Hepatocellular carcinoma ([place])      12/19/2014 Initial Biopsy    Liver mass biopsy showed parathyroid carcinoma, positive for hep par 1, cytokeratin 8/18, cytokeratin 7 and MOC-31.      12/25/2014 Imaging    CT chest showed a 3 mm groundglass probably nodule in the right upper lobe, mild right pericardial phrenic lymphadenopathy, no definitive evidence of metastasis.      01/13/2015 Procedure    Liquid embolization of the right portal venous system by Interventional Radiologist Dr. Laurence Ferrari      03/04/2015 Surgery    Liver right lobe hepatectomy, and the portal lymph nodes biopsy      03/04/2015 Surgery    Hepatocellular carcinoma, poorly differentiated, spanning 8.5 cm, surgical margins were negative, lymphovascular invasion is identified, for portal lymph nodes were negative.      HISTORY OF PRESENTING ILLNESS:  Tina Bailey 70 y.o. female is here because of recently discovered liver mass.  She has had abdominal pain after meals for the past 4 years, occurs once every several months, usually resolves after 20-30 mins on its own. She was seen by her PCP, had [place] and her symptoms was felt to  be related to her gall bladder stone.   She has had some pressure feeling under right ribcage, which radiates to back, for the past 8-9 months, and slight decrease her appetite, and fatigue, she lost about 10 lbs.   She went to see Dr. Oneida Alar for left 5th toes color change (blue), no claudication, she underwent CT angiogram which incidentally showed a large liver mass and a 1.2 cm pancreatic cystic lesion. She was referred to [place] for further management.   She never had colonoscopy. She denies melena, and she is here, change of her bowel habit. Se lives with her daughter. She has extensive smoking history, no family history of malignancy.  CURRENT THERAPY: Surveillance   INTERIM HISTROY Tina Bailey returns for follow-up. She was sen by Dr. Barry Dienes on 10/12/15 for her RUQ abdominal pain, liver MRI was done afterwards. She still has significant pain, she also developed right chest pain after she tried to reach something on 9/4, and she though she may developed a fracture, she had x-rays which was negative for fracture. She keeps complaining about body pain during her entire office visit. She take vicodin 3 times a day, which was ordered by her orthopedic surgeon after her spinal compression fracture.  MEDICAL HISTORY:  Past Medical History:  Diagnosis Date  . Arthritis    DDD. Right shoulder"is frozen"-limited ROM. osteoporosis.  . Blue toe syndrome ([place]) 11/27/2014   Dr. Claudia Pollock evaluating  . Cancer ([place]) 12/2014   liver cancer  . Complication of anesthesia   .  COPD (chronic obstructive pulmonary disease) ([place])   . Fibromyalgia   . Fracture of rib of right side    hx "osteoporosis"- states her dog nudge her on the side, next day developed great pain and was told has a fracture rib.  . Macular degeneration    R eye  . Osteoporosis   . PONV (postoperative nausea and vomiting)    nausea,severe vomiting after 01-13-15 portal vein embolization  . Productive cough   . Retina disorder    L eye,  vision distorted, edema  . Spine fracture due to birth trauma   . TMJ disease   . Wears glasses     SURGICAL HISTORY: Past Surgical History:  Procedure Laterality Date  . ANGIOPLASTY  2008   no stents required, no follow-up with cardiologist, no recurrent chest pain  . CATARACT EXTRACTION, BILATERAL Bilateral   . ESOPHAGOGASTRODUODENOSCOPY (EGD) WITH PROPOFOL N/A 06/25/2015   Procedure: ESOPHAGOGASTRODUODENOSCOPY (EGD) WITH PROPOFOL;  Surgeon: Milus Banister, MD;  Location: [facility];  Service: Endoscopy;  Laterality: N/A;  stent removal   . EYE SURGERY     cornea surgery  . KIDNEY STONE SURGERY    . LAPAROSCOPIC PARTIAL HEPATECTOMY N/A 03/04/2015   Procedure: DIAGNOSTIC LAPAROSCOPY, EXTENDED RIGHT HEPATECTOMY, WITH INTRAOPERATIVE ULTRASOUND;  Surgeon: Stark Klein, MD;  Location: [facility];  Service: General;  Laterality: N/A;  . PARTIAL HYSTERECTOMY    . portal vein embolization     01-13-15 -Dr. Cathlean Sauer.    SOCIAL HISTORY: Social History   Social History  . Marital Status: Divorced    Spouse Name: N/A  . Number of Children: 3   . Years of Education: N/A   Occupational History  . 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 Smoking status: Current Every Day Smoker -- 1.00 packs/day for 45 years    Types: Cigarettes  . Smokeless tobacco: Never Used  . Alcohol Use: No  . Drug Use: No  . Sexual Activity: Not on file   Other Topics Concern  . Not on file   Social History Narrative    FAMILY HISTORY: Family History  Problem Relation Age of Onset  . Hypertension Mother   . Atrial fibrillation Mother   . Heart disease Mother     after age 38  . Stroke Father   . Dementia Father   . Heart disease Brother     After age 92- A-Fib  . Heart attack Brother     ALLERGIES:  is allergic to amoxicillin-pot clavulanate; paroxetine hcl; and sulfa antibiotics.  MEDICATIONS:  Current Outpatient Prescriptions  Medication  Sig Dispense Refill  . carboxymethylcellulose (REFRESH TEARS) 0.5 % SOLN Place 1-2 drops into both eyes 3 (three) times daily as needed (for dry eyes).     . Carboxymethylcellulose Sodium (THERATEARS) 0.25 % SOLN Apply 1 drop to eye daily.    Marland Kitchen FORTEO 600 MCG/2.4ML SOLN Inject 1 Dose into the skin daily. Take 1 injection daily for 18 months.    Marland Kitchen HYDROcodone-acetaminophen (NORCO/VICODIN) 5-325 MG tablet Take 1 tablet by mouth as needed.    Marland Kitchen ibuprofen (ADVIL,MOTRIN) 200 MG tablet Take 200 mg by mouth as needed.    . Multiple Vitamin (MULTIVITAMIN WITH MINERALS) TABS tablet Take 1 tablet by mouth daily.    . Omega-3 Fatty Acids (OMEGA-3 FISH OIL PO) Take 1 tablet by mouth 3 (three) times daily. [employer] brand    . VITAMIN D, CHOLECALCIFEROL, PO Take  5,000 Units by mouth daily.     No current facility-administered medications for this visit.     REVIEW OF SYSTEMS:   Constitutional: Denies fevers, chills or abnormal night sweats, (+) fatigue and 10 lb weight loss  Eyes: Denies blurriness of vision, double vision or watery eyes Ears, nose, mouth, throat, and face: Denies mucositis or sore throat Respiratory: Denies cough, dyspnea or wheezes Cardiovascular: Denies palpitation, chest discomfort or lower extremity swelling Gastrointestinal:  Denies nausea, heartburn or change in bowel habits, se HPI  Skin: Denies abnormal skin rashes Lymphatics: Denies new lymphadenopathy or easy bruising Neurological:Denies numbness, tingling or new weaknesses Behavioral/Psych: Mood is stable, no new changes  All other systems were reviewed with the patient and are negative.  PHYSICAL EXAMINATION: ECOG PERFORMANCE STATUS: 2  Vitals:   12/07/15 1238  BP: 137/65  Pulse: 86  Resp: 18  Temp: 98.1 F (36.7 C)   Filed Weights   12/07/15 1238  Weight: 111 lb 4.8 oz (50.5 kg)    GENERAL:alert, no distress and comfortable, she wear a thoracic spine brace  SKIN: skin color, texture, turgor are normal, no  rashes or significant lesions EYES: normal, conjunctiva are pink and non-injected, sclera clear OROPHARYNX:no exudate, no erythema and lips, buccal mucosa, and tongue normal  NECK: supple, thyroid normal size, non-tender, without nodularity LYMPH:  no palpable lymphadenopathy in the cervical, axillary or inguinal LUNGS: clear to auscultation and percussion with normal breathing effort HEART: regular rate & rhythm and no murmurs and no lower extremity edema ABDOMEN:abdomen soft, non-tender and normal bowel sounds, no organomegaly. Musculoskeletal:no cyanosis of digits and no clubbing, no tender spot. PSYCH: alert & oriented x 3 with fluent speech NEURO: no focal motor/sensory deficits  LABORATORY DATA:  I have reviewed the data as listed CBC Latest Ref Rng & Units 12/07/2015 09/07/2015 06/10/2015  WBC 3.9 - 10.3 10e3/uL 7.2 7.1 9.2  Hemoglobin 11.6 - 15.9 g/dL 13.5 14.3 13.2  Hematocrit 34.8 - 46.6 % 41.1 42.2 39.1  Platelets 145 - 400 10e3/uL 252 275 248    CMP Latest Ref Rng & Units 12/07/2015 09/07/2015 06/10/2015  Glucose 70 - 140 mg/dl 106 101 143(H)  BUN 7.0 - 26.0 mg/dL 13.2 12.2 13  Creatinine 0.6 - 1.1 mg/dL 0.6 0.7 0.52  Sodium 136 - 145 mEq/L 138 135(L) 136  Potassium 3.5 - 5.1 mEq/L 5.0 5.1 4.2  Chloride 101 - 111 mmol/L - - 99(L)  CO2 22 - 29 mEq/L 30(H) 30(H) 29  Calcium 8.4 - 10.4 mg/dL 10.0 9.4 9.3  Total Protein 6.4 - 8.3 g/dL 7.1 7.4 7.5  Total Bilirubin 0.20 - 1.20 mg/dL 0.54 0.39 1.0  Alkaline Phos 40 - 150 U/L 107 114 225(H)  AST 5 - 34 U/L 19 19 34  ALT 0 - 55 U/L 13 18 28    Results for RHIANN, MARINOS (MRN [id number]) as of 12/08/2015 21:47  Ref. Range 06/09/2015 12:21 09/07/2015 11:30 12/07/2015 12:23  AFP Tumor Marker Latest Ref Range: <6.1 ng/mL 3.6 2.5 3.4  AFP, Serum, Tumor Marker Latest Ref Range: 0.0 - 8.3 ng/mL 4.1 2.7 4.7    PATHOLOGY REPORT  Diagnosis 03/04/2015 1. Liver, biopsy, left sided nodule - BENIGN HEPATIC PARENCHYMA. - THERE IS NO  EVIDENCE OF MALIGNANCY. 2. Liver, biopsy, posterior sided nodule - BENIGN HEPATIC PARENCHYMA WITH ASSOCIATED FIBROUS TISSUE. - THERE IS NO EVIDENCE OF MALIGNANCY. 3. Lymph node, biopsy, portal - THERE IS NO EVIDENCE OF CARCINOMA IN 3 OF 3 LYMPH NODES (0/3). 4. Lymph  node, biopsy, posterior portal - THERE IS NO EVIDENCE OF CARCINOMA IN 1 OF 1 LYMPH NODE (0/1). 5. Liver, hepatectomy, right lobe - HEPATOCELLULAR CARCINOMA, POORLY DIFFERENTIATED, SPANNING 8.5 CM. - THE SURGICAL RESECTION MARGINS ARE NEGATIVE FOR CARCINOMA. - LYMPHOVASCULAR INVASION IS IDENTIFIED. - SEE ONCOLOGY TABLE BELOW. OTHER FINDINGS: - BENIGN HEPATIC PARENCHYMA WITH MILD PORTAL CHRONIC INFLAMMATION AND DILATED VASCULATURE. - GALLBLADDER WITH MILD CHRONIC CHOLECYSTITIS AND CHOLELITHIASIS.  RADIOGRAPHIC STUDIES: I have personally reviewed the radiological images as listed and agreed with the findings in the report.  Mr Liver W Wo Contrast 12/02/2014   IMPRESSION: 1. The first lesion of concern is a fairly vascular mass spanning segments 5 and 4B of the liver with capsule or pseudocapsule appearance, and central enhancement which spreads peripherally on the later images, and also with associated hepatic capsular retraction. Differential diagnostic considerations for this lesion include peripheral cholangiocarcinoma ; hepatic epitheliod hemangioendothelioma ; hepatocellular carcinoma ; or treated metastatic disease. Biopsy is likely warranted. 2. The second lesion of concern is a 1.2 cm pancreatic body lesion with high T2 and low T1 signal characteristics, questionable attachment to the dorsal pancreatic duct, and some internal septation and probably some faint nodular enhancement internally. The appearance on CT, for example image 62 series 9, is somewhat more concerning for internal enhancement, although measurable enhancement is present on MRI. This could be a small macro cystic tumor with solid component; the primary ductal  pancreatic adenocarcinoma with cystic degeneration; were cystic degeneration of an islet cell tumor such as insulinoma or glucagon adenoma. Metastatic disease to the pancreas could possibly appear this way. Intraductal papillary mucinous neoplasm with small solid component could also appear this way. Biopsy, surveillance, or further imaging characterization with nuclear medicine PET-CT may be warranted. 3. Cholelithiasis. 4. Mild biliary dilatation. 5. Mildly enlarged portacaval lymph node. In the setting of the hepatic and pancreatic lesions the appearance raises concern for the possibility of early local metastatic disease. This could also be further characterized at PET-CT.   Electronically Signed   By: Van Clines M.D.   On: 12/02/2014 08:39   CT chest w contrast 12/25/2014 IMPRESSION: 1. Solitary 3 mm ground-glass pulmonary nodule in the right upper lobe. Recommend attention on follow-up chest CT in 3 months. 2. Mild right pericardiophrenic lymphadenopathy, possibly metastatic. 3. Mild centrilobular emphysema. 4. Nondisplaced healing subacute lateral left fifth rib fracture. 5. Right thyroid lobe 1.8 cm nodule. Recommend correlation with thyroid ultrasound. This follows ACR consensus guidelines: Managing Incidental Thyroid Nodules Detected on Imaging: White Paper of the ACR Incidental Thyroid Findings Committee. J Am Coll Radiol 2015; 12:143-150  MRI abdomen w wo contrast 10/27/2015 IMPRESSION: Status post right hepatectomy. No findings suspicious for recurrent HCC.  Stable 1.3 cm cystic lesion in the pancreatic body, possibly reflecting a pseudocyst or IPMN.  Mild-to-moderate T7 and T11 compression fracture deformities, likely progressed.   ASSESSMENT & PLAN: 70 year old African-American female, with 45-pack-year smoking history, presented with incidental scan finding of a large peripheral liver lesion in the right lobe segmental 5 and a 4B, and a small pancreatic cystic  lesion.  1. Hepatocellular carcinoma, pT3bN0M0, stage IIIB, poorly differentiated  -I reviewed her surgical pathology findings with her in details. Her surgical margins were negative, she had a complete resection -No role for adjuvant therapy.  -We discussed the risk of cancer recurrence. Giving the locally advanced stage, she is certainly at high risk for recurrence, especially local recurrence. -She is clinically doing well, physical exam was unremarkable, lab reviewed, her  recent liver function and AFP are within normal limits. No clinical concern for recurrence. -We discussed the surveillance for cancer recurrence after her surgery. I'll see her every 3-4 months with lab including AFP, and repeated surveillance CT scan every 6-12 months, for total 5 years   2. Pancreatic lesion -Abdominal MRI showed a 1.2 cm pancreatic body lesion, likely cystic with some solid component.  -Her imaging was reviewed in our tumor before, radiologist feels they pancreatic lesion is less likely malignancy. -recent abdomen MRI in 10/2015 showed stable pancreatic cyst, likely benign -We'll follow up clinically.  3. COPD and smoking cessation  -She will continue to follow-up with her primary care physician -I discussed the smoking cessation extensively. She is willing to cut back, not ready for quit completely yet.  4. T11 compression fracture -she will continue follow up with her orthopedic surgeon -improved some   5. RUQ abdominal pain, right chest pain and back pain -abdominal MRI showed no pathological changes in her RUQ abdomen, not sure if her pain is related to her liver surgery vs radiated pain from her prior T11 fracture  -I encourage her to follow up with her PCP and orthopedic surgeon -She is on a daily as needed   Plan -I'll see her back in 3 months for follow-up, with lab and CT abdomen and pelvis one week before  All questions were answered. The patient knows to call the clinic with any  problems, questions or concerns. I spent 20 minutes counseling the patient face to face. The total time spent in the appointment was 25 minutes and more than 50% was on counseling.     Truitt Merle, MD 12/07/2015

## 2015-12-08 ENCOUNTER — Encounter: Payer: Self-pay | Admitting: Hematology

## 2015-12-08 LAB — AFP TUMOR MARKER: AFP, Serum, Tumor Marker: 4.7 ng/mL (ref 0.0–8.3)

## 2015-12-08 LAB — ALPHA FETO PROTEIN (PARALLEL TESTING): AFP-Tumor Marker: 3.4 ng/mL (ref <6.1)

## 2015-12-11 ENCOUNTER — Encounter: Payer: Self-pay | Admitting: Family

## 2015-12-17 ENCOUNTER — Ambulatory Visit (HOSPITAL_COMMUNITY): Payer: Medicare Other

## 2015-12-17 ENCOUNTER — Ambulatory Visit: Payer: Medicare Other | Admitting: Family

## 2016-02-29 ENCOUNTER — Other Ambulatory Visit (HOSPITAL_BASED_OUTPATIENT_CLINIC_OR_DEPARTMENT_OTHER): Payer: Medicare Other

## 2016-02-29 ENCOUNTER — Other Ambulatory Visit: Payer: Self-pay | Admitting: *Deleted

## 2016-02-29 ENCOUNTER — Encounter (HOSPITAL_COMMUNITY): Payer: Self-pay

## 2016-02-29 ENCOUNTER — Ambulatory Visit (HOSPITAL_COMMUNITY)
Admission: RE | Admit: 2016-02-29 | Discharge: 2016-02-29 | Disposition: A | Discharge status: Home / Self Care | Payer: Medicare Other | Source: Ambulatory Visit | Attending: Hematology | Admitting: Hematology

## 2016-02-29 DIAGNOSIS — C220 Liver cell carcinoma: Secondary | ICD-10-CM

## 2016-02-29 DIAGNOSIS — Z9889 Other specified postprocedural states: Secondary | ICD-10-CM | POA: Diagnosis not present

## 2016-02-29 DIAGNOSIS — I700 Atherosclerosis of aorta: Secondary | ICD-10-CM | POA: Insufficient documentation

## 2016-02-29 DIAGNOSIS — K869 Disease of pancreas, unspecified: Secondary | ICD-10-CM | POA: Insufficient documentation

## 2016-02-29 DIAGNOSIS — Z9089 Acquired absence of other organs: Secondary | ICD-10-CM | POA: Insufficient documentation

## 2016-02-29 DIAGNOSIS — Z9049 Acquired absence of other specified parts of digestive tract: Secondary | ICD-10-CM | POA: Insufficient documentation

## 2016-02-29 DIAGNOSIS — K838 Other specified diseases of biliary tract: Secondary | ICD-10-CM | POA: Diagnosis not present

## 2016-02-29 DIAGNOSIS — N8110 Cystocele, unspecified: Secondary | ICD-10-CM | POA: Diagnosis not present

## 2016-02-29 LAB — CBC WITH DIFFERENTIAL/PLATELET
BASO%: 0.6 % (ref 0.0–2.0)
Basophils Absolute: 0 10*3/uL (ref 0.0–0.1)
EOS%: 0.9 % (ref 0.0–7.0)
Eosinophils Absolute: 0.1 10*3/uL (ref 0.0–0.5)
HCT: 43.6 % (ref 34.8–46.6)
HGB: 14.3 g/dL (ref 11.6–15.9)
LYMPH%: 31.4 % (ref 14.0–49.7)
MCH: 29.8 pg (ref 25.1–34.0)
MCHC: 32.7 g/dL (ref 31.5–36.0)
MCV: 91.2 fL (ref 79.5–101.0)
MONO#: 0.5 10*3/uL (ref 0.1–0.9)
MONO%: 7.4 % (ref 0.0–14.0)
NEUT#: 4.4 10*3/uL (ref 1.5–6.5)
NEUT%: 59.7 % (ref 38.4–76.8)
Platelets: 230 10*3/uL (ref 145–400)
RBC: 4.78 10*6/uL (ref 3.70–5.45)
RDW: 13.5 % (ref 11.2–14.5)
WBC: 7.3 10*3/uL (ref 3.9–10.3)
lymph#: 2.3 10*3/uL (ref 0.9–3.3)

## 2016-02-29 LAB — COMPREHENSIVE METABOLIC PANEL
ALT: 18 U/L (ref 0–55)
AST: 20 U/L (ref 5–34)
Albumin: 3.7 g/dL (ref 3.5–5.0)
Alkaline Phosphatase: 99 U/L (ref 40–150)
Anion Gap: 8 mEq/L (ref 3–11)
BUN: 10.8 mg/dL (ref 7.0–26.0)
CO2: 30 mEq/L — ABNORMAL HIGH (ref 22–29)
Calcium: 10 mg/dL (ref 8.4–10.4)
Chloride: 97 mEq/L — ABNORMAL LOW (ref 98–109)
Creatinine: 0.7 mg/dL (ref 0.6–1.1)
EGFR: 89 mL/min/{1.73_m2} — ABNORMAL LOW (ref >90)
Glucose: 107 mg/dl (ref 70–140)
Potassium: 4.9 mEq/L (ref 3.5–5.1)
Sodium: 135 mEq/L — ABNORMAL LOW (ref 136–145)
Total Bilirubin: 0.49 mg/dL (ref 0.20–1.20)
Total Protein: 7.2 g/dL (ref 6.4–8.3)

## 2016-02-29 MED ORDER — IOPAMIDOL (ISOVUE-300) INJECTION 61%
100.0000 mL | Freq: Once | INTRAVENOUS | Status: AC | PRN
  Administered 2016-02-29: 80 mL via INTRAVENOUS

## 2016-02-29 MED ORDER — IOPAMIDOL (ISOVUE-300) INJECTION 61%
INTRAVENOUS | Status: AC
  Filled 2016-02-29: qty 100

## 2016-02-29 MED ORDER — SODIUM CHLORIDE 0.9 % IJ SOLN
INTRAMUSCULAR | Status: AC
  Filled 2016-02-29: qty 50

## 2016-03-01 LAB — AFP TUMOR MARKER: AFP, Serum, Tumor Marker: 3 ng/mL (ref 0.0–8.3)

## 2016-03-07 ENCOUNTER — Ambulatory Visit (HOSPITAL_BASED_OUTPATIENT_CLINIC_OR_DEPARTMENT_OTHER): Payer: Medicare Other | Admitting: Hematology

## 2016-03-07 ENCOUNTER — Telehealth: Payer: Self-pay | Admitting: Hematology

## 2016-03-07 ENCOUNTER — Encounter: Payer: Self-pay | Admitting: Hematology

## 2016-03-07 VITALS — BP 158/81 | HR 105 | Temp 97.8°F | Resp 18 | Ht 66.0 in | Wt 106.7 lb

## 2016-03-07 DIAGNOSIS — C220 Liver cell carcinoma: Secondary | ICD-10-CM

## 2016-03-07 DIAGNOSIS — Z8505 Personal history of malignant neoplasm of liver: Secondary | ICD-10-CM

## 2016-03-07 NOTE — Progress Notes (Signed)
[place]  [phone] [phone]  Clinic follow Up Note   Patient Care Team: Orpah Melter, MD as PCP - General (Family Medicine) Elam Dutch, MD as Consulting Physician (Vascular Surgery) 03/07/2016   CHIEF COMPLAINTS:  Hepatocellular carcinoma  Oncology History   Hepatocellular carcinoma ([place])   Staging form: Liver (Excluding Intrahepatic Bile Ducts), AJCC 7th Edition     Clinical stage from 12/30/2014: Stage Unknown (T1, NX, M0) - Unsigned       Hepatocellular carcinoma ([place])   12/02/2014 Imaging    Liver MRI showed a large lesion in segment 5 and 40, highly suspicious for malignancy. And a 1.2 cm indeterminate pancreatic body lesion. Mildly enlarged portocaval lymph node. Cholelithiasis.      12/19/2014 Initial Diagnosis    Hepatocellular carcinoma ([place])      12/19/2014 Initial Biopsy    Liver mass biopsy showed parathyroid carcinoma, positive for hep par 1, cytokeratin 8/18, cytokeratin 7 and MOC-31.      12/25/2014 Imaging    CT chest showed a 3 mm groundglass probably nodule in the right upper lobe, mild right pericardial phrenic lymphadenopathy, no definitive evidence of metastasis.      01/13/2015 Procedure    Liquid embolization of the right portal venous system by Interventional Radiologist Dr. Laurence Ferrari      03/04/2015 Surgery    Liver right lobe hepatectomy, and the portal lymph nodes biopsy      03/04/2015 Surgery    Hepatocellular carcinoma, poorly differentiated, spanning 8.5 cm, surgical margins were negative, lymphovascular invasion is identified, for portal lymph nodes were negative.      HISTORY OF PRESENTING ILLNESS:  Tina Bailey 70 y.o. female is here because of recently discovered liver mass.  She has had abdominal pain after meals for the past 4 years, occurs once every several months, usually resolves after 20-30 mins on its own. She was seen by her PCP, had [place] and her symptoms was felt  to be related to her gall bladder stone.   She has had some pressure feeling under right ribcage, which radiates to back, for the past 8-9 months, and slight decrease her appetite, and fatigue, she lost about 10 lbs.   She went to see Dr. Oneida Alar for left 5th toes color change (blue), no claudication, she underwent CT angiogram which incidentally showed a large liver mass and a 1.2 cm pancreatic cystic lesion. She was referred to [place] for further management.   She never had colonoscopy. She denies melena, and she is here, change of her bowel habit. Se lives with her daughter. She has extensive smoking history, no family history of malignancy.  CURRENT THERAPY: Surveillance   INTERIM HISTROY Tina Bailey returns for follow-up. She complains of mid back pain since a few weeks ago, for which she takes hydrocordone prescribed by her orthopedic surgeon. She also complains of moderate to severe fatigue, not able to do much activities. She still has intermittent RUQ and epigastric abdominal pain, on a daily base, she has to hold her epigastric area when she walks due to the pain. This has been going on since her liver surgery. She denies any nausea, change of bowel habits, melena, hematochezia, or other new complaints. Her appetite is decent, weight is stable.  MEDICAL HISTORY:  Past Medical History:  Diagnosis Date  . Arthritis    DDD. Right shoulder"is frozen"-limited ROM. osteoporosis.  . Blue toe syndrome ([place]) 11/27/2014   Dr. Claudia Pollock evaluating  . Cancer ([place]) 12/2014  liver cancer  . Complication of anesthesia   . COPD (chronic obstructive pulmonary disease) ([place])   . Fibromyalgia   . Fracture of rib of right side    hx "osteoporosis"- states her dog nudge her on the side, next day developed great pain and was told has a fracture rib.  . Macular degeneration    R eye  . Osteoporosis   . PONV (postoperative nausea and vomiting)    nausea,severe vomiting after 01-13-15 portal vein embolization    . Productive cough   . Retina disorder    L eye, vision distorted, edema  . Spine fracture due to birth trauma   . TMJ disease   . Wears glasses     SURGICAL HISTORY: Past Surgical History:  Procedure Laterality Date  . ANGIOPLASTY  2008   no stents required, no follow-up with cardiologist, no recurrent chest pain  . CATARACT EXTRACTION, BILATERAL Bilateral   . ESOPHAGOGASTRODUODENOSCOPY (EGD) WITH PROPOFOL N/A 06/25/2015   Procedure: ESOPHAGOGASTRODUODENOSCOPY (EGD) WITH PROPOFOL;  Surgeon: Milus Banister, MD;  Location: [facility];  Service: Endoscopy;  Laterality: N/A;  stent removal   . EYE SURGERY     cornea surgery  . KIDNEY STONE SURGERY    . LAPAROSCOPIC PARTIAL HEPATECTOMY N/A 03/04/2015   Procedure: DIAGNOSTIC LAPAROSCOPY, EXTENDED RIGHT HEPATECTOMY, WITH INTRAOPERATIVE ULTRASOUND;  Surgeon: Stark Klein, MD;  Location: [facility];  Service: General;  Laterality: N/A;  . PARTIAL HYSTERECTOMY    . portal vein embolization     01-13-15 -Dr. Cathlean Sauer.    SOCIAL HISTORY: Social History   Social History  . Marital Status: Divorced    Spouse Name: N/A  . Number of Children: 3   . Years of Education: N/A   Occupational History  . 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 Smoking status: Current Every Day Smoker -- 1.00 packs/day for 45 years    Types: Cigarettes  . Smokeless tobacco: Never Used  . Alcohol Use: No  . Drug Use: No  . Sexual Activity: Not on file   Other Topics Concern  . Not on file   Social History Narrative    FAMILY HISTORY: Family History  Problem Relation Age of Onset  . Hypertension Mother   . Atrial fibrillation Mother   . Heart disease Mother     after age 60  . Stroke Father   . Dementia Father   . Heart disease Brother     After age 3- A-Fib  . Heart attack Brother     ALLERGIES:  is allergic to amoxicillin-pot clavulanate; paroxetine hcl; and sulfa  antibiotics.  MEDICATIONS:  Current Outpatient Prescriptions  Medication Sig Dispense Refill  . carboxymethylcellulose (REFRESH TEARS) 0.5 % SOLN Place 1-2 drops into both eyes 3 (three) times daily as needed (for dry eyes).     . Carboxymethylcellulose Sodium (THERATEARS) 0.25 % SOLN Apply 1 drop to eye daily.    Marland Kitchen FORTEO 600 MCG/2.4ML SOLN Inject 1 Dose into the skin daily. Take 1 injection daily for 18 months.    Marland Kitchen HYDROcodone-acetaminophen (NORCO/VICODIN) 5-325 MG tablet Take 1 tablet by mouth as needed.    Marland Kitchen ibuprofen (ADVIL,MOTRIN) 200 MG tablet Take 200 mg by mouth as needed.    . Multiple Vitamin (MULTIVITAMIN WITH MINERALS) TABS tablet Take 1 tablet by mouth daily.    . Omega-3 Fatty Acids (OMEGA-3 FISH OIL PO) Take 1 tablet by mouth 3 (three) times daily.  [employer] brand    . VITAMIN D, CHOLECALCIFEROL, PO Take 5,000 Units by mouth daily.     No current facility-administered medications for this visit.     REVIEW OF SYSTEMS:   Constitutional: Denies fevers, chills or abnormal night sweats, (+) fatigue Eyes: Denies blurriness of vision, double vision or watery eyes Ears, nose, mouth, throat, and face: Denies mucositis or sore throat Respiratory: Denies cough, dyspnea or wheezes Cardiovascular: Denies palpitation, chest discomfort or lower extremity swelling Gastrointestinal:  Denies nausea, heartburn or change in bowel habits, se HPI  Skin: Denies abnormal skin rashes Lymphatics: Denies new lymphadenopathy or easy bruising Neurological:Denies numbness, tingling or new weaknesses, (+) mid back pain  Behavioral/Psych: Mood is stable, no new changes  All other systems were reviewed with the patient and are negative.  PHYSICAL EXAMINATION: ECOG PERFORMANCE STATUS: 2-3  Vitals:   03/07/16 1325  BP: (!) 158/81  Pulse: (!) 105  Resp: 18  Temp: 97.8 F (36.6 C)   Filed Weights   03/07/16 1325  Weight: 106 lb 11.2 oz (48.4 kg)    GENERAL:alert, no distress and comfortable,  she wear a thoracic spine brace  SKIN: skin color, texture, turgor are normal, no rashes or significant lesions EYES: normal, conjunctiva are pink and non-injected, sclera clear OROPHARYNX:no exudate, no erythema and lips, buccal mucosa, and tongue normal  NECK: supple, thyroid normal size, non-tender, without nodularity LYMPH:  no palpable lymphadenopathy in the cervical, axillary or inguinal LUNGS: clear to auscultation and percussion with normal breathing effort HEART: regular rate & rhythm and no murmurs and no lower extremity edema ABDOMEN:abdomen soft, non-tender and normal bowel sounds, no organomegaly. Musculoskeletal:no cyanosis of digits and no clubbing, no tender spot. PSYCH: alert & oriented x 3 with fluent speech NEURO: no focal motor/sensory deficits  LABORATORY DATA:  I have reviewed the data as listed CBC Latest Ref Rng & Units 02/29/2016 12/07/2015 09/07/2015  WBC 3.9 - 10.3 10e3/uL 7.3 7.2 7.1  Hemoglobin 11.6 - 15.9 g/dL 14.3 13.5 14.3  Hematocrit 34.8 - 46.6 % 43.6 41.1 42.2  Platelets 145 - 400 10e3/uL 230 252 275    CMP Latest Ref Rng & Units 02/29/2016 12/07/2015 09/07/2015  Glucose 70 - 140 mg/dl 107 106 101  BUN 7.0 - 26.0 mg/dL 10.8 13.2 12.2  Creatinine 0.6 - 1.1 mg/dL 0.7 0.6 0.7  Sodium 136 - 145 mEq/L 135(L) 138 135(L)  Potassium 3.5 - 5.1 mEq/L 4.9 5.0 5.1  Chloride 101 - 111 mmol/L - - -  CO2 22 - 29 mEq/L 30(H) 30(H) 30(H)  Calcium 8.4 - 10.4 mg/dL 10.0 10.0 9.4  Total Protein 6.4 - 8.3 g/dL 7.2 7.1 7.4  Total Bilirubin 0.20 - 1.20 mg/dL 0.49 0.54 0.39  Alkaline Phos 40 - 150 U/L 99 107 114  AST 5 - 34 U/L 20 19 19   ALT 0 - 55 U/L 18 13 18    AFP tumor marker  Order: [id number]  Status:  Final result Visible to patient:  No (Not Released) Next appt:  04/07/2016 at 12:00 PM in Cardiology ([facility]) Dx:  Cancer, hepatocellular ([place])    Ref Range & Units 7d ago 40mo ago 35mo ago 50mo ago   AFP, Serum, Tumor Marker 0.0 - 8.3 ng/mL 3.0  4.7CM   2.7CM  4.1CM           PATHOLOGY REPORT  Diagnosis 03/04/2015 1. Liver, biopsy, left sided nodule - BENIGN HEPATIC PARENCHYMA. - THERE IS NO EVIDENCE OF MALIGNANCY. 2. Liver,  biopsy, posterior sided nodule - BENIGN HEPATIC PARENCHYMA WITH ASSOCIATED FIBROUS TISSUE. - THERE IS NO EVIDENCE OF MALIGNANCY. 3. Lymph node, biopsy, portal - THERE IS NO EVIDENCE OF CARCINOMA IN 3 OF 3 LYMPH NODES (0/3). 4. Lymph node, biopsy, posterior portal - THERE IS NO EVIDENCE OF CARCINOMA IN 1 OF 1 LYMPH NODE (0/1). 5. Liver, hepatectomy, right lobe - HEPATOCELLULAR CARCINOMA, POORLY DIFFERENTIATED, SPANNING 8.5 CM. - THE SURGICAL RESECTION MARGINS ARE NEGATIVE FOR CARCINOMA. - LYMPHOVASCULAR INVASION IS IDENTIFIED. - SEE ONCOLOGY TABLE BELOW. OTHER FINDINGS: - BENIGN HEPATIC PARENCHYMA WITH MILD PORTAL CHRONIC INFLAMMATION AND DILATED VASCULATURE. - GALLBLADDER WITH MILD CHRONIC CHOLECYSTITIS AND CHOLELITHIASIS.  RADIOGRAPHIC STUDIES: I have personally reviewed the radiological images as listed and agreed with the findings in the report.  Mr Liver W Wo Contrast 12/02/2014   IMPRESSION: 1. The first lesion of concern is a fairly vascular mass spanning segments 5 and 4B of the liver with capsule or pseudocapsule appearance, and central enhancement which spreads peripherally on the later images, and also with associated hepatic capsular retraction. Differential diagnostic considerations for this lesion include peripheral cholangiocarcinoma ; hepatic epitheliod hemangioendothelioma ; hepatocellular carcinoma ; or treated metastatic disease. Biopsy is likely warranted. 2. The second lesion of concern is a 1.2 cm pancreatic body lesion with high T2 and low T1 signal characteristics, questionable attachment to the dorsal pancreatic duct, and some internal septation and probably some faint nodular enhancement internally. The appearance on CT, for example image 62 series 9, is somewhat more concerning for  internal enhancement, although measurable enhancement is present on MRI. This could be a small macro cystic tumor with solid component; the primary ductal pancreatic adenocarcinoma with cystic degeneration; were cystic degeneration of an islet cell tumor such as insulinoma or glucagon adenoma. Metastatic disease to the pancreas could possibly appear this way. Intraductal papillary mucinous neoplasm with small solid component could also appear this way. Biopsy, surveillance, or further imaging characterization with nuclear medicine PET-CT may be warranted. 3. Cholelithiasis. 4. Mild biliary dilatation. 5. Mildly enlarged portacaval lymph node. In the setting of the hepatic and pancreatic lesions the appearance raises concern for the possibility of early local metastatic disease. This could also be further characterized at PET-CT.   Electronically Signed   By: Van Clines M.D.   On: 12/02/2014 08:39   CT chest w contrast 02/29/2016 IMPRESSION: 1. Status post partial right hepatectomy, without recurrent or metastatic disease. 2. Removal of common duct stent since 06/10/2015. Upper normal to mildly dilated common duct after cholecystectomy. Recommend attention on follow-up. 3. Similar pancreatic head lesion, as detailed on prior MRI. 4. Pelvic floor laxity with cystocele. 5.  Aortic atherosclerosis.  MRI abdomen w wo contrast 10/27/2015 IMPRESSION: Status post right hepatectomy. No findings suspicious for recurrent HCC.  Stable 1.3 cm cystic lesion in the pancreatic body, possibly reflecting a pseudocyst or IPMN.  Mild-to-moderate T7 and T11 compression fracture deformities, likely progressed.   ASSESSMENT & PLAN: 70 y.o. African-American female, with 45-pack-year smoking history, presented with incidental scan finding of a large peripheral liver lesion in the right lobe segmental 5 and a 4B, and a small pancreatic cystic lesion.  1. Hepatocellular carcinoma, pT3bN0M0, stage IIIB,  poorly differentiated  -I reviewed her surgical pathology findings with her in details. Her surgical margins were negative, she had a complete resection -No role for adjuvant therapy.  -We discussed the risk of cancer recurrence. Giving the locally advanced stage, she is certainly at high risk for recurrence, especially local  recurrence. -She is clinically doing well, physical exam was unremarkable, lab reviewed, her recent liver function and AFP are within normal limits. No clinical concern for recurrence. -I reviewed her surveillance CT scan from last week, which showed no evidence of recurrence - continue cancer surveillance, she will be seen every 4-6 months with lab including AFP, and repeated surveillance CT scan every 6-12 months, for total 5 years. She has been follow-up with her surgeon Dr. Barry Dienes also, we'll tentatively see her.   2. Pancreatic lesion -Abdominal MRI showed a 1.2 cm pancreatic body lesion, likely cystic with some solid component.  -Her imaging was reviewed in our tumor before, radiologist feels they pancreatic lesion is less likely malignancy. -recent abdomen MRI in 10/2015 showed stable pancreatic cyst, likely benign -We'll follow up clinically.  3. COPD and smoking cessation  -She will continue to follow-up with her primary care physician -I discussed the smoking cessation extensively. She is willing to cut back, not ready for quit completely yet.  4. T11 compression fracture -she will continue follow up with her orthopedic surgeon -her mid back pain is worse lately, on narcotics   5. RUQ abdominal pain -abdominal MRI showed no pathological changes in her RUQ abdomen, not sure if her pain is related to her liver surgery vs radiated pain from her prior T11 fracture  -I encourage her to follow up with her PCP and orthopedic surgeon -She is on  hydrocodone as needed -She'll follow-up with Dr. Barry Dienes also    Plan -I'll see her back in 4 months for follow-up, with  lab  -She is scheduled to see her surgeon Dr. Barry Dienes later this week, I encouraged her to schedule her appointment with Dr. Barry Dienes and me separately, for her cancer surveillance.  All questions were answered. The patient knows to call the clinic with any problems, questions or concerns. I spent 20 minutes counseling the patient face to face. The total time spent in the appointment was 25 minutes and more than 50% was on counseling.     Truitt Merle, MD 03/07/2016

## 2016-03-07 NOTE — Telephone Encounter (Signed)
Appointments scheduled per 12/18 LOS. Patient given AVS report and calendars with future scheduled appointments.  °

## 2016-04-05 ENCOUNTER — Encounter: Payer: Self-pay | Admitting: Family

## 2016-04-07 ENCOUNTER — Ambulatory Visit: Payer: Medicare Other | Admitting: Family

## 2016-04-07 ENCOUNTER — Ambulatory Visit (HOSPITAL_COMMUNITY): Payer: Medicare Other

## 2016-05-02 DIAGNOSIS — R05 Cough: Secondary | ICD-10-CM | POA: Diagnosis not present

## 2016-05-02 DIAGNOSIS — J40 Bronchitis, not specified as acute or chronic: Secondary | ICD-10-CM | POA: Diagnosis not present

## 2016-05-02 DIAGNOSIS — J020 Streptococcal pharyngitis: Secondary | ICD-10-CM | POA: Diagnosis not present

## 2016-05-02 DIAGNOSIS — J029 Acute pharyngitis, unspecified: Secondary | ICD-10-CM | POA: Diagnosis not present

## 2016-05-13 DIAGNOSIS — J020 Streptococcal pharyngitis: Secondary | ICD-10-CM | POA: Diagnosis not present

## 2016-05-20 ENCOUNTER — Encounter: Payer: Self-pay | Admitting: Family

## 2016-05-27 ENCOUNTER — Ambulatory Visit (INDEPENDENT_AMBULATORY_CARE_PROVIDER_SITE_OTHER): Payer: PPO | Admitting: Family

## 2016-05-27 ENCOUNTER — Ambulatory Visit (HOSPITAL_COMMUNITY)
Admission: RE | Admit: 2016-05-27 | Discharge: 2016-05-27 | Disposition: A | Discharge status: Home / Self Care | Payer: PPO | Source: Ambulatory Visit | Attending: Family | Admitting: Family

## 2016-05-27 ENCOUNTER — Encounter: Payer: Self-pay | Admitting: Family

## 2016-05-27 VITALS — BP 143/80 | HR 91 | Temp 97.3°F | Resp 18 | Ht 66.0 in | Wt 102.3 lb

## 2016-05-27 DIAGNOSIS — I75022 Atheroembolism of left lower extremity: Secondary | ICD-10-CM | POA: Diagnosis not present

## 2016-05-27 DIAGNOSIS — I779 Disorder of arteries and arterioles, unspecified: Secondary | ICD-10-CM | POA: Diagnosis not present

## 2016-05-27 DIAGNOSIS — F17200 Nicotine dependence, unspecified, uncomplicated: Secondary | ICD-10-CM

## 2016-05-27 DIAGNOSIS — I739 Peripheral vascular disease, unspecified: Secondary | ICD-10-CM | POA: Diagnosis not present

## 2016-05-27 NOTE — Patient Instructions (Signed)
Peripheral Vascular Disease Peripheral vascular disease (PVD) is a disease of the blood vessels that are not part of your heart and brain. A simple term for PVD is poor circulation. In most cases, PVD narrows the blood vessels that carry blood from your heart to the rest of your body. This can result in a decreased supply of blood to your arms, legs, and internal organs, like your stomach or kidneys. However, it most often affects a person's lower legs and feet. There are two types of PVD.  Organic PVD. This is the more common type. It is caused by damage to the structure of blood vessels.  Functional PVD. This is caused by conditions that make blood vessels contract and tighten (spasm). Without treatment, PVD tends to get worse over time. PVD can also lead to acute ischemic limb. This is when an arm or limb suddenly has trouble getting enough blood. This is a medical emergency. Follow these instructions at home:  Take medicines only as told by your doctor.  Do not use any tobacco products, including cigarettes, chewing tobacco, or electronic cigarettes. If you need help quitting, ask your doctor.  Lose weight if you are overweight, and maintain a healthy weight as told by your doctor.  Eat a diet that is low in fat and cholesterol. If you need help, ask your doctor.  Exercise regularly. Ask your doctor for some good activities for you.  Take good care of your feet.  Wear comfortable shoes that fit well.  Check your feet often for any cuts or sores. Contact a doctor if:  You have cramps in your legs while walking.  You have leg pain when you are at rest.  You have coldness in a leg or foot.  Your skin changes.  You are unable to get or have an erection (erectile dysfunction).  You have cuts or sores on your feet that are not healing. Get help right away if:  Your arm or leg turns cold and blue.  Your arms or legs become red, warm, swollen, painful, or numb.  You have  chest pain or trouble breathing.  You suddenly have weakness in your face, arm, or leg.  You become very confused or you cannot speak.  You suddenly have a very bad headache.  You suddenly cannot see. This information is not intended to replace advice given to you by your health care provider. Make sure you discuss any questions you have with your health care provider. Document Released: 06/01/2009 Document Revised: 08/13/2015 Document Reviewed: 08/15/2013 Elsevier Interactive Patient Education  2017 Elsevier Inc.      Steps to Quit Smoking Smoking tobacco can be bad for your health. It can also affect almost every organ in your body. Smoking puts you and people around you at risk for many serious long-lasting (chronic) diseases. Quitting smoking is hard, but it is one of the best things that you can do for your health. It is never too late to quit. What are the benefits of quitting smoking? When you quit smoking, you lower your risk for getting serious diseases and conditions. They can include:  Lung cancer or lung disease.  Heart disease.  Stroke.  Heart attack.  Not being able to have children (infertility).  Weak bones (osteoporosis) and broken bones (fractures). If you have coughing, wheezing, and shortness of breath, those symptoms may get better when you quit. You may also get sick less often. If you are pregnant, quitting smoking can help to lower your chances   of having a baby of low birth weight. What can I do to help me quit smoking? Talk with your doctor about what can help you quit smoking. Some things you can do (strategies) include:  Quitting smoking totally, instead of slowly cutting back how much you smoke over a period of time.  Going to in-person counseling. You are more likely to quit if you go to many counseling sessions.  Using resources and support systems, such as:  Online chats with a counselor.  Phone quitlines.  Printed self-help  materials.  Support groups or group counseling.  Text messaging programs.  Mobile phone apps or applications.  Taking medicines. Some of these medicines may have nicotine in them. If you are pregnant or breastfeeding, do not take any medicines to quit smoking unless your doctor says it is okay. Talk with your doctor about counseling or other things that can help you. Talk with your doctor about using more than one strategy at the same time, such as taking medicines while you are also going to in-person counseling. This can help make quitting easier. What things can I do to make it easier to quit? Quitting smoking might feel very hard at first, but there is a lot that you can do to make it easier. Take these steps:  Talk to your family and friends. Ask them to support and encourage you.  Call phone quitlines, reach out to support groups, or work with a counselor.  Ask people who smoke to not smoke around you.  Avoid places that make you want (trigger) to smoke, such as:  Bars.  Parties.  Smoke-break areas at work.  Spend time with people who do not smoke.  Lower the stress in your life. Stress can make you want to smoke. Try these things to help your stress:  Getting regular exercise.  Deep-breathing exercises.  Yoga.  Meditating.  Doing a body scan. To do this, close your eyes, focus on one area of your body at a time from head to toe, and notice which parts of your body are tense. Try to relax the muscles in those areas.  Download or buy apps on your mobile phone or tablet that can help you stick to your quit plan. There are many free apps, such as QuitGuide from the CDC (Centers for Disease Control and Prevention). You can find more support from smokefree.gov and other websites. This information is not intended to replace advice given to you by your health care provider. Make sure you discuss any questions you have with your health care provider. Document Released:  01/01/2009 Document Revised: 11/03/2015 Document Reviewed: 07/22/2014 Elsevier Interactive Patient Education  2017 Elsevier Inc.  

## 2016-05-27 NOTE — Progress Notes (Signed)
[facility] [place]   CC: Follow up peripheral artery occlusive disease  History of Present Illness Tina Bailey is a 71 y.o. female patient of Dr. Oneida Alar whom he has evaluated for pain and bluish discoloration left fifth toe. The patient began to have pain in her left fifth toe about June of 2016.  Toe was hurting primarily in the morning time. She denies claudication symptoms. She denies any prior episodes.  She denies family history of abdominal aortic aneurysm. She denies history of diabetes. Other medical problems include COPD, arthritis, coronary artery disease area all of these are currently stable.  Dr. Oneida Alar last saw pt on 12/04/14. At that time CT angiogram of the abdomen and pelvis with runoff was reviewed. There is a 50% stenosis of the left superficial femoral artery. Otherwise her arterial tree is without any significant pathology. However, a mass was detected in the central aspect of her liver. This was approximate 5-6 cm in diameter. A follow-up MRI of the abdomen was performed which suggests that this mass may be malignant. There were also several masses noted in the tail of the pancreas.  Improved left fifth toe. In light of the fact that the plaque in her left SFA seemed fairly benign Dr. Oneida Alar believes the best option at this point would be dual antiplatelets therapy with Plavix and aspirin for 6 months. If her symptoms are overall stable at the end of 6 months we could switch back to just aspirin alone. Dr. Oneida Alar prescriped Plavix but told her not to fill this until after she is seen by the oncology doctors as she will most likely need a biopsy of her liver. After the workup of her liver/pancreas mass is concluded she could start her Plavix at that point.  If the patient has an additional embolic event she may need to be considered for left superficial femoral artery stenting.   Pt returns today for follow up with ABI's.  She had liver resection in  December of 2016, pt states half of her liver was removed. Her stamina has been decreased from this and does not walk much.  She denies claudication sx's with walking. She states the blue color in her left 5th toe has resolved.   She reports she had several fractures, diagnosed with osteoporosis, takes Forteo.   Her walking is limited by dyspnea and fatigue, difficulty standing up.  She has had several URI's recently, will see ENT soon.   She is under pain management care for vertebral compression fractures related pain. She has had chronic pain since her 33's with fibromyalgia.   Pt denies any hx of stroke or TIA, denies any cardiac issues.  Pt Diabetic: No Pt smoker: smoker  (1/2 ppd, decreased from 1-2 ppd, started at age 34 yrs)  Pt meds include: Statin :No Betablocker: No ASA: Yes Other anticoagulants/antiplatelets: no, pt states her oncologist and surgeon advised her not to take Plavix    Past Medical History:  Diagnosis Date  . Arthritis    DDD. Right shoulder"is frozen"-limited ROM. osteoporosis.  . Blue toe syndrome ([place]) 11/27/2014   Dr. Claudia Pollock evaluating  . Cancer ([place]) 12/2014   liver cancer  . Complication of anesthesia   . COPD (chronic obstructive pulmonary disease) ([place])   . Fibromyalgia   . Fracture of rib of right side    hx "osteoporosis"- states her dog nudge her on the side, next day developed great pain and was told has a fracture rib.  Marland Kitchen  Macular degeneration    R eye  . Osteoporosis   . PONV (postoperative nausea and vomiting)    nausea,severe vomiting after 01-13-15 portal vein embolization  . Productive cough   . Retina disorder    L eye, vision distorted, edema  . Spine fracture due to birth trauma   . TMJ disease   . Wears glasses     Social History Social History  Substance Use Topics  . Smoking status: Current Every Day Smoker    Packs/day: 1.00    Years: 45.00    Types: Cigarettes  . Smokeless tobacco: Never Used  . Alcohol  use No    Family History Family History  Problem Relation Age of Onset  . Hypertension Mother   . Atrial fibrillation Mother   . Heart disease Mother     after age 52  . Stroke Father   . Dementia Father   . Heart disease Brother     After age 75- A-Fib  . Heart attack Brother     Past Surgical History:  Procedure Laterality Date  . ANGIOPLASTY  2008   no stents required, no follow-up with cardiologist, no recurrent chest pain  . CATARACT EXTRACTION, BILATERAL Bilateral   . ESOPHAGOGASTRODUODENOSCOPY (EGD) WITH PROPOFOL N/A 06/25/2015   Procedure: ESOPHAGOGASTRODUODENOSCOPY (EGD) WITH PROPOFOL;  Surgeon: Milus Banister, MD;  Location: [facility];  Service: Endoscopy;  Laterality: N/A;  stent removal   . EYE SURGERY     cornea surgery  . KIDNEY STONE SURGERY    . LAPAROSCOPIC PARTIAL HEPATECTOMY N/A 03/04/2015   Procedure: DIAGNOSTIC LAPAROSCOPY, EXTENDED RIGHT HEPATECTOMY, WITH INTRAOPERATIVE ULTRASOUND;  Surgeon: Stark Klein, MD;  Location: [facility];  Service: General;  Laterality: N/A;  . PARTIAL HYSTERECTOMY    . portal vein embolization     01-13-15 -Dr. Cathlean Sauer.    Allergies  Allergen Reactions  . Amoxicillin-Pot Clavulanate Nausea Only    Has patient had a PCN reaction causing immediate rash, facial/tongue/throat swelling, SOB or lightheadedness with hypotension: No Has patient had a PCN reaction causing severe rash involving mucus membranes or skin necrosis: No Has patient had a PCN reaction that required hospitalization No Has patient had a PCN reaction occurring within the last 10 years: No If all of the above answers are "NO", then may proceed with Cephalosporin use.   . Paroxetine Hcl Rash  . Sulfa Antibiotics Rash    Current Outpatient Prescriptions  Medication Sig Dispense Refill  . Carboxymethylcellulose Sodium (THERATEARS) 0.25 % SOLN Apply 1 drop to eye daily.    Marland Kitchen FORTEO 600 MCG/2.4ML SOLN Inject 1 Dose into the skin daily. Take 1  injection daily for 18 months.    Marland Kitchen HYDROcodone-acetaminophen (NORCO/VICODIN) 5-325 MG tablet Take 1 tablet by mouth as needed.    . Multiple Vitamin (MULTIVITAMIN WITH MINERALS) TABS tablet Take 1 tablet by mouth daily.    . Omega-3 Fatty Acids (OMEGA-3 FISH OIL PO) Take 1 tablet by mouth 3 (three) times daily. [employer] brand    . VITAMIN D, CHOLECALCIFEROL, PO Take 5,000 Units by mouth daily.    . carboxymethylcellulose (REFRESH TEARS) 0.5 % SOLN Place 1-2 drops into both eyes 3 (three) times daily as needed (for dry eyes).     Marland Kitchen ibuprofen (ADVIL,MOTRIN) 200 MG tablet Take 200 mg by mouth as needed.     No current facility-administered medications for this visit.     ROS: See HPI for pertinent positives and negatives.   Physical Examination  Vitals:  05/27/16 1303 05/27/16 1305  BP: (!) 142/78 (!) 143/80  Pulse: 91   Resp: 18   Temp: 97.3 F (36.3 C)   TempSrc: Oral   SpO2: 93%   Weight: 102 lb 4.8 oz (46.4 kg)   Height: 5\' 6"  (1.676 m)    Body mass index is 16.51 kg/m.  General: A&O x 3, WDWN, thin female. Gait: slow, halting Eyes: PERRLA. Pulmonary: limited air movement in all fields, faint wheezes left posterior fields, +moist cough Cardiac: regular rhythm with occasional premature contractions, no detected murmur.         Carotid Bruits Right Left   Negative Negative  Aorta is palpable. Radial pulses: right is 2+ and left is 1+ palpable                           VASCULAR EXAM: Extremities without ischemic changes, without Gangrene; without open wounds. All toes are pink with brisk capillary refill. Feet are cool to touch.                                                                                                                                                        LE Pulses Right Left       FEMORAL  3+ palpable  3+ palpable        POPLITEAL  not palpable   not palpable       POSTERIOR TIBIAL  not palpable   not palpable        DORSALIS PEDIS       ANTERIOR TIBIAL 1+ palpable  not palpable    Abdomen: soft, non tender, no palpable masses, well healed surgical scar. Skin: no rashes, no ulcers. Musculoskeletal: no muscle wasting or atrophy.         Neurologic: A&O X 3; Appropriate Affect ; SENSATION: normal; MOTOR FUNCTION:  moving all extremities equally, motor strength 4/5 throughout. Speech is fluent/normal. CN 2-12 intact. Loquacious, anxious.      ASSESSMENT: Tina Bailey is a 71 y.o. female who has a recent history of spontaneously resolved blue toe syndrome of the left 5th toe. CT angiogram in September 2016 shows 50% stenosis of the left superficial femoral artery. Otherwise her arterial tree is without any significant pathology.  Liver and pancreas masses incidentally found on this CTA resulted in resection of a malignant liver mass in December of 2016; she is trying to regain some stamina after this. She has no claudication sx's with walking and has no signs of ischemia in her feet/legs. Her walking is limited by dyspnea, difficulty standing erect, and fatigue.  Her atherosclerotic risk factors include active smoking (50 years), liver cancer, and COPD.   DATA (05/27/16): ABI's: Right: 0.90 (1.17, 06-11-15), waveforms: tri and biphasic; TBI: 0.45 Left: 0.77 (0.70), waveforms: biphasic;  TBI: dampened Right ABI declined slightly, left ABI improved slightly.    CT angiogram of the abdomen and pelvis with runoff: September 2016:  There is a 50% stenosis of the left superficial femoral artery. Otherwise her arterial tree is without any significant pathology. However, a mass was detected in the central aspect of her liver. This was approximate 5-6 cm in diameter. A follow-up MRI of the abdomen was performed which suggests that this mass may be malignant. There were also several masses noted in the tail of the pancreas.   PLAN:  The patient was counseled re smoking cessation and given several free resources re  smoking cessation. Discussed daily seated leg exercises, stationary bike, or water exercises since her walking is limited by dyspnea, trouble standing erect,  and lack of stamina.   Based on the patient's vascular studies and examination, pt will return to clinic in 6 months with ABI's.   I discussed in depth with the patient the nature of atherosclerosis, and emphasized the importance of maximal medical management including strict control of blood pressure, blood glucose, and lipid levels, obtaining regular exercise, and cessation of smoking.  The patient is aware that without maximal medical management the underlying atherosclerotic disease process will progress, limiting the benefit of any interventions.  The patient was given information about PAD including signs, symptoms, treatment, what symptoms should prompt the patient to seek immediate medical care, and risk reduction measures to take.  Clemon Chambers, RN, MSN, FNP-C [facility] Phone: [phone]  Clinic MD: Chen/Cain  05/27/16 1:09 PM

## 2016-06-03 DIAGNOSIS — S22089D Unspecified fracture of T11-T12 vertebra, subsequent encounter for fracture with routine healing: Secondary | ICD-10-CM | POA: Diagnosis not present

## 2016-06-03 DIAGNOSIS — M4854XA Collapsed vertebra, not elsewhere classified, thoracic region, initial encounter for fracture: Secondary | ICD-10-CM | POA: Diagnosis not present

## 2016-06-03 DIAGNOSIS — M546 Pain in thoracic spine: Secondary | ICD-10-CM | POA: Diagnosis not present

## 2016-06-03 DIAGNOSIS — G894 Chronic pain syndrome: Secondary | ICD-10-CM | POA: Diagnosis not present

## 2016-06-09 DIAGNOSIS — F17200 Nicotine dependence, unspecified, uncomplicated: Secondary | ICD-10-CM | POA: Diagnosis not present

## 2016-06-09 DIAGNOSIS — K219 Gastro-esophageal reflux disease without esophagitis: Secondary | ICD-10-CM | POA: Diagnosis not present

## 2016-06-09 DIAGNOSIS — M26629 Arthralgia of temporomandibular joint, unspecified side: Secondary | ICD-10-CM | POA: Diagnosis not present

## 2016-06-29 ENCOUNTER — Telehealth: Payer: Self-pay | Admitting: Hematology

## 2016-06-29 NOTE — Telephone Encounter (Signed)
Off day - moved 4/17 appointments to 4/27. Spoke with patient.

## 2016-07-05 ENCOUNTER — Other Ambulatory Visit: Payer: Medicare Other

## 2016-07-05 ENCOUNTER — Ambulatory Visit: Payer: Medicare Other | Admitting: Hematology

## 2016-07-11 DIAGNOSIS — M4854XA Collapsed vertebra, not elsewhere classified, thoracic region, initial encounter for fracture: Secondary | ICD-10-CM | POA: Diagnosis not present

## 2016-07-11 DIAGNOSIS — S22089D Unspecified fracture of T11-T12 vertebra, subsequent encounter for fracture with routine healing: Secondary | ICD-10-CM | POA: Diagnosis not present

## 2016-07-11 DIAGNOSIS — M546 Pain in thoracic spine: Secondary | ICD-10-CM | POA: Diagnosis not present

## 2016-07-13 ENCOUNTER — Ambulatory Visit (INDEPENDENT_AMBULATORY_CARE_PROVIDER_SITE_OTHER): Payer: PPO | Admitting: Family Medicine

## 2016-07-13 ENCOUNTER — Encounter: Payer: Self-pay | Admitting: Family Medicine

## 2016-07-13 VITALS — BP 128/89 | HR 89 | Temp 98.1°F | Resp 17 | Ht 66.0 in | Wt 101.1 lb

## 2016-07-13 DIAGNOSIS — R350 Frequency of micturition: Secondary | ICD-10-CM | POA: Diagnosis not present

## 2016-07-13 DIAGNOSIS — M810 Age-related osteoporosis without current pathological fracture: Secondary | ICD-10-CM | POA: Insufficient documentation

## 2016-07-13 DIAGNOSIS — C220 Liver cell carcinoma: Secondary | ICD-10-CM

## 2016-07-13 DIAGNOSIS — F17210 Nicotine dependence, cigarettes, uncomplicated: Secondary | ICD-10-CM | POA: Diagnosis not present

## 2016-07-13 DIAGNOSIS — R8299 Other abnormal findings in urine: Secondary | ICD-10-CM

## 2016-07-13 DIAGNOSIS — M8000XG Age-related osteoporosis with current pathological fracture, unspecified site, subsequent encounter for fracture with delayed healing: Secondary | ICD-10-CM | POA: Diagnosis not present

## 2016-07-13 DIAGNOSIS — R8290 Unspecified abnormal findings in urine: Secondary | ICD-10-CM

## 2016-07-13 DIAGNOSIS — J449 Chronic obstructive pulmonary disease, unspecified: Secondary | ICD-10-CM

## 2016-07-13 DIAGNOSIS — F1190 Opioid use, unspecified, uncomplicated: Secondary | ICD-10-CM

## 2016-07-13 DIAGNOSIS — R82998 Other abnormal findings in urine: Secondary | ICD-10-CM

## 2016-07-13 LAB — POCT URINALYSIS DIPSTICK
Bilirubin, UA: NEGATIVE
Blood, UA: NEGATIVE
Glucose, UA: NEGATIVE
Ketones, UA: NEGATIVE
Nitrite, UA: NEGATIVE
Protein, UA: NEGATIVE
Spec Grav, UA: 1.01 (ref 1.010–1.025)
Urobilinogen, UA: 0.2 E.U./dL
pH, UA: 6 (ref 5.0–8.0)

## 2016-07-13 MED ORDER — CEPHALEXIN 500 MG PO CAPS: 500.0000 mg | ORAL_CAPSULE | Freq: Two times a day (BID) | ORAL | 0 refills | Status: AC

## 2016-07-13 NOTE — Progress Notes (Signed)
   Subjective:    Patient ID: Tina Bailey, female    DOB: 07/05/1945, 71 y.o.   MRN: [id number]  HPI New to establish.  Previous MD- Olen Pel  Liver cancer- pt dx'd w/ hepatocellular carcinoma in 2016.  Pt continues to see Dr Burr Medico.  s/p resection x2.  Osteoporosis- chronic problem, T score is '-4 point something'.  Pt has had multiple fxs.  Was on Forteo since August w/ Dr Dossie Der.  Pt states she is no longer willing or able to give herself shots.  Last DEXA was 07/2015.  Getting hydrocodone from Dr Nelva Bush due to chronic back/rib fractures.  Pt is not taking Ca or Vit D regularly.  Pt is attempting to take supplements and use essential oils.  Chronic narcotic use- pt is 'hooked on it now' after taking it for chronic back fractures. Getting medication from Dr Nelva Bush.   Depression- pt is very frustrated regarding her current situation.  She used to be very active prior to her liver resections and now due to her chronic fractures, she is very limited in what she is able to do.  This makes her very angry.  Pt just started PT on Monday.  Pt has rambling, scattered thoughts but keeps saying, 'i don't know if I trust the medical profession.  I just don't.'  Tobacco use- pt reports she will have bouts of SOB but 'for the most part' things are ok.  Not using inhalers- 'i got a headache'.  Pt was told previously she had COPD.  She is not interested in seeing pulmonary at this time.   Review of Systems For ROS see HPI     Objective:   Physical Exam  Constitutional: She is oriented to person, place, and time. No distress.  Frail, appears older than stated age  HENT:  Head: Normocephalic and atraumatic.  Eyes: Conjunctivae and EOM are normal. Pupils are equal, round, and reactive to light.  Neck: Normal range of motion. Neck supple. No thyromegaly present.  Cardiovascular: Normal rate, regular rhythm, normal heart sounds and intact distal pulses.   No murmur heard. Pulmonary/Chest: Effort normal and  breath sounds normal. No respiratory distress.  Abdominal: Soft. She exhibits no distension. There is no tenderness.  Musculoskeletal: She exhibits no edema.  Lymphadenopathy:    She has no cervical adenopathy.  Neurological: She is alert and oriented to person, place, and time.  Skin: Skin is warm and dry.  Psychiatric:  Emotionally labile- tearful, inappropriate laughter, frustration Rambling, tangential thought process  Vitals reviewed.         Assessment & Plan:

## 2016-07-13 NOTE — Patient Instructions (Addendum)
Schedule your complete physical in 6 months and your Wellness Visit with Maudie Mercury in the next 3 months Start Keflex twice daily for the UTI Drink plenty of fluids Please reconsider the Forteo shots w/ Dr Dossie Der.  This will help prevent future fractures Make sure you are taking at least 1200 units of Calcium and 800 units of Vit D Please consider starting a medication for your depression.  You deserve to feel better. Try and quit smoking- you can do it! Call with any questions or concerns Hang in there!!!

## 2016-07-13 NOTE — Progress Notes (Signed)
Pre visit review using our clinic review tool, if applicable. No additional management support is needed unless otherwise documented below in the visit note. 

## 2016-07-14 LAB — URINE CULTURE: Organism ID, Bacteria: NO GROWTH

## 2016-07-15 ENCOUNTER — Encounter: Payer: Self-pay | Admitting: Hematology

## 2016-07-15 ENCOUNTER — Other Ambulatory Visit (HOSPITAL_BASED_OUTPATIENT_CLINIC_OR_DEPARTMENT_OTHER): Payer: PPO

## 2016-07-15 ENCOUNTER — Ambulatory Visit (HOSPITAL_BASED_OUTPATIENT_CLINIC_OR_DEPARTMENT_OTHER): Payer: PPO | Admitting: Hematology

## 2016-07-15 VITALS — BP 147/78 | HR 97 | Temp 98.5°F | Resp 16 | Ht 66.0 in | Wt 100.1 lb

## 2016-07-15 DIAGNOSIS — C220 Liver cell carcinoma: Secondary | ICD-10-CM

## 2016-07-15 DIAGNOSIS — M810 Age-related osteoporosis without current pathological fracture: Secondary | ICD-10-CM | POA: Diagnosis not present

## 2016-07-15 DIAGNOSIS — J449 Chronic obstructive pulmonary disease, unspecified: Secondary | ICD-10-CM | POA: Diagnosis not present

## 2016-07-15 LAB — COMPREHENSIVE METABOLIC PANEL
ALT: 18 U/L (ref 0–55)
AST: 23 U/L (ref 5–34)
Albumin: 4.1 g/dL (ref 3.5–5.0)
Alkaline Phosphatase: 74 U/L (ref 40–150)
Anion Gap: 9 mEq/L (ref 3–11)
BUN: 13.6 mg/dL (ref 7.0–26.0)
CO2: 30 mEq/L — ABNORMAL HIGH (ref 22–29)
Calcium: 9.7 mg/dL (ref 8.4–10.4)
Chloride: 98 mEq/L (ref 98–109)
Creatinine: 0.7 mg/dL (ref 0.6–1.1)
EGFR: 89 mL/min/{1.73_m2} — ABNORMAL LOW (ref >90)
Glucose: 119 mg/dl (ref 70–140)
Potassium: 4.4 mEq/L (ref 3.5–5.1)
Sodium: 136 mEq/L (ref 136–145)
Total Bilirubin: 0.37 mg/dL (ref 0.20–1.20)
Total Protein: 6.9 g/dL (ref 6.4–8.3)

## 2016-07-15 LAB — CBC WITH DIFFERENTIAL/PLATELET
BASO%: 0.4 % (ref 0.0–2.0)
Basophils Absolute: 0 10*3/uL (ref 0.0–0.1)
EOS%: 1 % (ref 0.0–7.0)
Eosinophils Absolute: 0.1 10*3/uL (ref 0.0–0.5)
HCT: 43.6 % (ref 34.8–46.6)
HGB: 14.4 g/dL (ref 11.6–15.9)
LYMPH%: 24 % (ref 14.0–49.7)
MCH: 30.4 pg (ref 25.1–34.0)
MCHC: 33 g/dL (ref 31.5–36.0)
MCV: 92.2 fL (ref 79.5–101.0)
MONO#: 0.7 10*3/uL (ref 0.1–0.9)
MONO%: 8.8 % (ref 0.0–14.0)
NEUT#: 5.1 10*3/uL (ref 1.5–6.5)
NEUT%: 65.8 % (ref 38.4–76.8)
Platelets: 184 10*3/uL (ref 145–400)
RBC: 4.73 10*6/uL (ref 3.70–5.45)
RDW: 13.5 % (ref 11.2–14.5)
WBC: 7.7 10*3/uL (ref 3.9–10.3)
lymph#: 1.9 10*3/uL (ref 0.9–3.3)

## 2016-07-15 NOTE — Progress Notes (Signed)
[place]  [phone] [phone]  Clinic follow Up Note   Patient Care Team: Midge Minium, MD as PCP - General (Family Medicine) Elam Dutch, MD as Consulting Physician (Vascular Surgery) Stark Klein, MD as Consulting Physician (General Surgery) Truitt Merle, MD as Consulting Physician (Hematology) Suella Broad, MD as Consulting Physician (Physical Medicine and Rehabilitation) Valinda Party, MD as Consulting Physician (Rheumatology) 07/15/2016   CHIEF COMPLAINTS:  Hepatocellular carcinoma  Oncology History   Hepatocellular carcinoma ([place])   Staging form: Liver (Excluding Intrahepatic Bile Ducts), AJCC 7th Edition     Clinical stage from 12/30/2014: Stage Unknown (T1, NX, M0) - Unsigned       Hepatocellular carcinoma ([place])   12/02/2014 Imaging    Liver MRI showed a large lesion in segment 5 and 40, highly suspicious for malignancy. And a 1.2 cm indeterminate pancreatic body lesion. Mildly enlarged portocaval lymph node. Cholelithiasis.      12/19/2014 Initial Diagnosis    Hepatocellular carcinoma ([place])      12/19/2014 Initial Biopsy    Liver mass biopsy showed parathyroid carcinoma, positive for hep par 1, cytokeratin 8/18, cytokeratin 7 and MOC-31.      12/25/2014 Imaging    CT chest showed a 3 mm groundglass probably nodule in the right upper lobe, mild right pericardial phrenic lymphadenopathy, no definitive evidence of metastasis.      01/13/2015 Procedure    Liquid embolization of the right portal venous system by Interventional Radiologist Dr. Laurence Ferrari      03/04/2015 Surgery    Liver right lobe hepatectomy, and the portal lymph nodes biopsy      03/04/2015 Pathology Results    Hepatocellular carcinoma, poorly differentiated, spanning 8.5 cm, surgical margins were negative, lymphovascular invasion is identified, for portal lymph nodes were negative.       02/29/2016 Imaging    CT chest w contrast  02/29/2016 IMPRESSION: 1. Status post partial right hepatectomy, without recurrent or metastatic disease. 2. Removal of common duct stent since 06/10/2015. Upper normal to mildly dilated common duct after cholecystectomy. Recommend attention on follow-up. 3. Similar pancreatic head lesion, as detailed on prior MRI. 4. Pelvic floor laxity with cystocele. 5.  Aortic atherosclerosis.      HISTORY OF PRESENTING ILLNESS:  Tina Bailey 71 y.o. female is here because of recently discovered liver mass.  She has had abdominal pain after meals for the past 4 years, occurs once every several months, usually resolves after 20-30 mins on its own. She was seen by her PCP, had [place] and her symptoms was felt to be related to her gall bladder stone.   She has had some pressure feeling under right ribcage, which radiates to back, for the past 8-9 months, and slight decrease her appetite, and fatigue, she lost about 10 lbs.   She went to see Dr. Oneida Alar for left 5th toes color change (blue), no claudication, she underwent CT angiogram which incidentally showed a large liver mass and a 1.2 cm pancreatic cystic lesion. She was referred to [place] for further management.   She never had colonoscopy. She denies melena, and she is here, change of her bowel habit. Se lives with her daughter. She has extensive smoking history, no family history of malignancy.  CURRENT THERAPY: Surveillance   INTERIM HISTROY [name] returns for follow-up. On 03/09/16, the patient saw Dr. Barry Dienes and no evidence of disease was found on clinical exam.   She is taking Keflex for a UTI. She denies dysuria.  She is experiencing diarrhea and some upper left abdominal pain. And now the pain is resolved after stent was removed. She recently got a PCP Dr. Nestor Lewandowsky, who she saw on Wednesday. She takes Hydrocodone for pain prescribed by Dr. Nelva Bush. She reports she has not been active much. She stopped taking Forteo for her osteoporosis because she  didn't want to take shot anymore. She stopped in March 2018 and has ben on it since September 2017. She is not on Fosamax and is looking to drink mineral water to improve her bone health. She is taking Vitamin D, but not Calcium. She reports being directed not to take Calcium by her endocrinologist.She has SOB from COPD  MEDICAL HISTORY:  Past Medical History:  Diagnosis Date  . Arthritis    DDD. Right shoulder"is frozen"-limited ROM. osteoporosis.  . Blue toe syndrome ([place]) 11/27/2014   Dr. Claudia Pollock evaluating  . Cancer ([place]) 12/2014   liver cancer  . Complication of anesthesia   . COPD (chronic obstructive pulmonary disease) ([place])   . Fibromyalgia   . Fracture of rib of right side    hx "osteoporosis"- states her dog nudge her on the side, next day developed great pain and was told has a fracture rib.  . Macular degeneration    R eye  . Osteoporosis   . PONV (postoperative nausea and vomiting)    nausea,severe vomiting after 01-13-15 portal vein embolization  . Productive cough   . Retina disorder    L eye, vision distorted, edema  . Spine fracture due to birth trauma   . TMJ disease   . Wears glasses     SURGICAL HISTORY: Past Surgical History:  Procedure Laterality Date  . ANGIOPLASTY  2008   no stents required, no follow-up with cardiologist, no recurrent chest pain  . CATARACT EXTRACTION, BILATERAL Bilateral   . ESOPHAGOGASTRODUODENOSCOPY (EGD) WITH PROPOFOL N/A 06/25/2015   Procedure: ESOPHAGOGASTRODUODENOSCOPY (EGD) WITH PROPOFOL;  Surgeon: Milus Banister, MD;  Location: [facility];  Service: Endoscopy;  Laterality: N/A;  stent removal   . EYE SURGERY     cornea surgery  . KIDNEY STONE SURGERY    . LAPAROSCOPIC PARTIAL HEPATECTOMY N/A 03/04/2015   Procedure: DIAGNOSTIC LAPAROSCOPY, EXTENDED RIGHT HEPATECTOMY, WITH INTRAOPERATIVE ULTRASOUND;  Surgeon: Stark Klein, MD;  Location: [facility];  Service: General;  Laterality: N/A;  . PARTIAL HYSTERECTOMY    . portal vein  embolization     01-13-15 -Dr. Cathlean Sauer.    SOCIAL HISTORY: Social History   Social History  . Marital Status: Divorced    Spouse Name: N/A  . Number of Children: 3   . Years of Education: N/A   Occupational History  . 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 Smoking status: Current Every Day Smoker -- 1.00 packs/day for 45 years    Types: Cigarettes  . Smokeless tobacco: Never Used  . Alcohol Use: No  . Drug Use: No  . Sexual Activity: Not on file   Other Topics Concern  . Not on file   Social History Narrative    FAMILY HISTORY: Family History  Problem Relation Age of Onset  . Hypertension Mother   . Atrial fibrillation Mother   . Heart disease Mother     after age 14  . Stroke Father   . Dementia Father   . Heart disease Brother     After age 51- A-Fib  . Heart attack Brother  ALLERGIES:  is allergic to amoxicillin-pot clavulanate; paroxetine hcl; and sulfa antibiotics.  MEDICATIONS:  Current Outpatient Prescriptions  Medication Sig Dispense Refill  . AMINO ACIDS COMPLEX PO Take by mouth.    Marland Kitchen aspirin-acetaminophen-caffeine (EXCEDRIN MIGRAINE) 250-250-65 MG tablet Take by mouth every 6 (six) hours as needed for headache.    . cephALEXin (KEFLEX) 500 MG capsule Take 1 capsule (500 mg total) by mouth 2 (two) times daily. 10 capsule 0  . HYDROcodone-acetaminophen (NORCO/VICODIN) 5-325 MG tablet Take 1 tablet by mouth as needed.    . Multiple Vitamin (MULTIVITAMIN WITH MINERALS) TABS tablet Take 1 tablet by mouth daily.    . Omega-3 Fatty Acids (OMEGA-3 FISH OIL PO) Take 1 tablet by mouth 3 (three) times daily. [employer] brand    . VITAMIN D, CHOLECALCIFEROL, PO Take 5,000 Units by mouth daily.     No current facility-administered medications for this visit.     REVIEW OF SYSTEMS:  Constitutional: Denies fevers, chills or abnormal night sweats, (+) fatigue Eyes: Denies blurriness of vision, double vision or  watery eyes Ears, nose, mouth, throat, and face: Denies mucositis or sore throat Respiratory: Denies cough or wheezes (+) SOB Cardiovascular: Denies palpitation, chest discomfort or lower extremity swelling Gastrointestinal:  Denies nausea, heartburn (+) diarrhea  Skin: Denies abnormal skin rashes Lymphatics: Denies new lymphadenopathy or easy bruising Neurological:Denies numbness, tingling or new weaknesses, (+) mid back pain  Behavioral/Psych: Mood is stable, no new changes  All other systems were reviewed with the patient and are negative.  PHYSICAL EXAMINATION: ECOG PERFORMANCE STATUS: 2-3  Vitals:   07/15/16 1449  BP: (!) 147/78  Pulse: 97  Resp: 16  Temp: 98.5 F (36.9 C)   Filed Weights   07/15/16 1449  Weight: 100 lb 1.6 oz (45.4 kg)   GENERAL:alert, no distress and comfortable, she wear a thoracic spine brace  SKIN: skin color, texture, turgor are normal, no rashes or significant lesions EYES: normal, conjunctiva are pink and non-injected, sclera clear OROPHARYNX:no exudate, no erythema and lips, buccal mucosa, and tongue normal  NECK: supple, thyroid normal size, non-tender, without nodularity LYMPH:  no palpable lymphadenopathy in the cervical, axillary or inguinal LUNGS: (+) bilateral wheezing, no rales. HEART: regular rate & rhythm and no murmurs and no lower extremity edema ABDOMEN:abdomen soft, normal bowel sounds, no organomegaly (+) Mild tenderness in the RUQ, no hepatomegaly Musculoskeletal:no cyanosis of digits and no clubbing, no tender spot. PSYCH: alert & oriented x 3 with fluent speech NEURO: no focal motor/sensory deficits  LABORATORY DATA:  I have reviewed the data as listed CBC Latest Ref Rng & Units 07/15/2016 02/29/2016 12/07/2015  WBC 3.9 - 10.3 10e3/uL 7.7 7.3 7.2  Hemoglobin 11.6 - 15.9 g/dL 14.4 14.3 13.5  Hematocrit 34.8 - 46.6 % 43.6 43.6 41.1  Platelets 145 - 400 10e3/uL 184 230 252    CMP Latest Ref Rng & Units 07/15/2016 02/29/2016  12/07/2015  Glucose 70 - 140 mg/dl 119 107 106  BUN 7.0 - 26.0 mg/dL 13.6 10.8 13.2  Creatinine 0.6 - 1.1 mg/dL 0.7 0.7 0.6  Sodium 136 - 145 mEq/L 136 135(L) 138  Potassium 3.5 - 5.1 mEq/L 4.4 4.9 5.0  Chloride 101 - 111 mmol/L - - -  CO2 22 - 29 mEq/L 30(H) 30(H) 30(H)  Calcium 8.4 - 10.4 mg/dL 9.7 10.0 10.0  Total Protein 6.4 - 8.3 g/dL 6.9 7.2 7.1  Total Bilirubin 0.20 - 1.20 mg/dL 0.37 0.49 0.54  Alkaline Phos 40 - 150 U/L 74 99  107  AST 5 - 34 U/L 23 20 19   ALT 0 - 55 U/L 18 18 13    AFP tumor marker  Results for MARETTA, OVERDORF (MRN [id number]) as of 07/15/2016 14:52  Ref. Range 12/11/2014 12:34 06/09/2015 12:21 09/07/2015 11:30 12/07/2015 12:23 02/29/2016 11:25  AFP Tumor Marker Latest Ref Range: <6.1 ng/mL 15.7 (H) 3.6 2.5 3.4   AFP, Serum, Tumor Marker Latest Ref Range: 0.0 - 8.3 ng/mL  4.1 2.7 4.7 3.0  CA 19-9 Latest Ref Range: <35.0 U/mL 11.2      CEA Latest Ref Range: 0.0 - 5.0 ng/mL 3.6        PATHOLOGY REPORT  Diagnosis 03/04/2015 1. Liver, biopsy, left sided nodule - BENIGN HEPATIC PARENCHYMA. - THERE IS NO EVIDENCE OF MALIGNANCY. 2. Liver, biopsy, posterior sided nodule - BENIGN HEPATIC PARENCHYMA WITH ASSOCIATED FIBROUS TISSUE. - THERE IS NO EVIDENCE OF MALIGNANCY. 3. Lymph node, biopsy, portal - THERE IS NO EVIDENCE OF CARCINOMA IN 3 OF 3 LYMPH NODES (0/3). 4. Lymph node, biopsy, posterior portal - THERE IS NO EVIDENCE OF CARCINOMA IN 1 OF 1 LYMPH NODE (0/1). 5. Liver, hepatectomy, right lobe - HEPATOCELLULAR CARCINOMA, POORLY DIFFERENTIATED, SPANNING 8.5 CM. - THE SURGICAL RESECTION MARGINS ARE NEGATIVE FOR CARCINOMA. - LYMPHOVASCULAR INVASION IS IDENTIFIED. - SEE ONCOLOGY TABLE BELOW. OTHER FINDINGS: - BENIGN HEPATIC PARENCHYMA WITH MILD PORTAL CHRONIC INFLAMMATION AND DILATED VASCULATURE. - GALLBLADDER WITH MILD CHRONIC CHOLECYSTITIS AND CHOLELITHIASIS.  RADIOGRAPHIC STUDIES: I have personally reviewed the radiological images as listed and agreed with  the findings in the report.  Mr Liver W Wo Contrast 12/02/2014   IMPRESSION: 1. The first lesion of concern is a fairly vascular mass spanning segments 5 and 4B of the liver with capsule or pseudocapsule appearance, and central enhancement which spreads peripherally on the later images, and also with associated hepatic capsular retraction. Differential diagnostic considerations for this lesion include peripheral cholangiocarcinoma ; hepatic epitheliod hemangioendothelioma ; hepatocellular carcinoma ; or treated metastatic disease. Biopsy is likely warranted. 2. The second lesion of concern is a 1.2 cm pancreatic body lesion with high T2 and low T1 signal characteristics, questionable attachment to the dorsal pancreatic duct, and some internal septation and probably some faint nodular enhancement internally. The appearance on CT, for example image 62 series 9, is somewhat more concerning for internal enhancement, although measurable enhancement is present on MRI. This could be a small macro cystic tumor with solid component; the primary ductal pancreatic adenocarcinoma with cystic degeneration; were cystic degeneration of an islet cell tumor such as insulinoma or glucagon adenoma. Metastatic disease to the pancreas could possibly appear this way. Intraductal papillary mucinous neoplasm with small solid component could also appear this way. Biopsy, surveillance, or further imaging characterization with nuclear medicine PET-CT may be warranted. 3. Cholelithiasis. 4. Mild biliary dilatation. 5. Mildly enlarged portacaval lymph node. In the setting of the hepatic and pancreatic lesions the appearance raises concern for the possibility of early local metastatic disease. This could also be further characterized at PET-CT.   Electronically Signed   By: Van Clines M.D.   On: 12/02/2014 08:39    MRI abdomen w wo contrast 10/27/2015 IMPRESSION: Status post right hepatectomy. No findings suspicious for  recurrent HCC. Stable 1.3 cm cystic lesion in the pancreatic body, possibly reflecting a pseudocyst or IPMN. Mild-to-moderate T7 and T11 compression fracture deformities, likely progressed.  CT chest w contrast 02/29/2016 IMPRESSION: 1. Status post partial right hepatectomy, without recurrent or metastatic disease. 2. Removal of common  duct stent since 06/10/2015. Upper normal to mildly dilated common duct after cholecystectomy. Recommend attention on follow-up. 3. Similar pancreatic head lesion, as detailed on prior MRI. 4. Pelvic floor laxity with cystocele. 5.  Aortic atherosclerosis.   ASSESSMENT & PLAN: 71 y.o. African-American female, with 45-pack-year smoking history, presented with incidental scan finding of a large peripheral liver lesion in the right lobe segmental 5 and a 4B, and a small pancreatic cystic lesion.  1. Hepatocellular carcinoma, pT3bN0M0, stage IIIB, poorly differentiated  -I reviewed her surgical pathology findings with her in details. Her surgical margins were negative, she had a complete resection -No role for adjuvant therapy.  -We discussed the risk of cancer recurrence. Giving the locally advanced stage, she is certainly at high risk for recurrence, especially local recurrence. -I previously reviewed her surveillance CT scan from 02/29/16, which showed no evidence of recurrence - continue cancer surveillance, she will be seen every 4-6 months with lab including AFP, and repeated surveillance CT scan every 6-12 months, for total 5 years. She has been follow-up with her surgeon Dr. Barry Dienes also. -She is clinically doing well, physical exam was unremarkable, lab reviewed, her recent liver function and AFP are within normal limits. No clinical concern for recurrence. -Liver MRI w/o contrast in 3 months.   2. Pancreatic lesion -Abdominal MRI showed a 1.2 cm pancreatic body lesion, likely cystic with some solid component.  -Her imaging was reviewed in our  tumor before, radiologist feels they pancreatic lesion is less likely malignancy. -abdomen MRI in 10/2015 showed stable pancreatic cyst, likely benign -We'll follow up clinically.  3. COPD and smoking cessation  -She will continue to follow-up with her primary care physician -I discussed the smoking cessation extensively. She is willing to cut back, not ready for quit completely yet.  4. T11 compression fracture -she will continue follow up with her orthopedic surgeon -her mid back pain is still on going, on narcotics   5. RUQ abdominal pain -abdominal MRI showed no pathological changes in her RUQ abdomen, not sure if her pain is related to her liver surgery vs radiated pain from her prior T11 fracture  -I encourage her to follow up with her PCP and orthopedic surgeon -She is on  hydrocodone as needed -She'll continue follow-up with Dr. Barry Dienes -Her stent was removed by Dr. Barry Dienes and abdominal has resolved.   6. Osteoporosis -The patient's last DEXA scan was performed by her [facility] -She was prescribed FORTEO in September 2017, but stopped in March 2018 because she did not want to do injections anymore. -She is taking a multivitamin and Vitamin D.  Plan -f/u in 3 months with lab and liver MRI w wo contrast a few days before.  All questions were answered. The patient knows to call the clinic with any problems, questions or concerns. I spent 20 minutes counseling the patient face to face. The total time spent in the appointment was 25 minutes and more than 50% was on counseling.     Truitt Merle, MD 07/15/2016   This document serves as a record of services personally performed by Truitt Merle, MD. It was created on her behalf by Darcus Austin, a trained medical scribe. The creation of this record is based on the scribe's personal observations and the provider's statements to them. This document has been checked and approved by the attending provider.

## 2016-07-16 ENCOUNTER — Encounter: Payer: Self-pay | Admitting: Hematology

## 2016-07-16 LAB — AFP TUMOR MARKER: AFP, Serum, Tumor Marker: 3.6 ng/mL (ref 0.0–8.3)

## 2016-07-18 NOTE — Assessment & Plan Note (Signed)
New to provider, ongoing for pt.  Not currently on inhalers- 'they gave me a headache'.  Continues to smoke.  Has occasional SOB.  Not interested in seeing pulmonary at this time.  Will follow.

## 2016-07-18 NOTE — Assessment & Plan Note (Signed)
New to provider, ongoing for pt.  Following w/ Dr Nelva Bush for chronic back pain due to compression fxs.  Will follow along.

## 2016-07-18 NOTE — Assessment & Plan Note (Signed)
New to provider, ongoing for pt.  She stopped her Forteo last month b/c she did not want to continue to give herself shots.  She is not taking Ca or Vit D regularly- stressed the need for both.  Tried to discuss starting Prolia or Fosamax or other osteoporosis tx but pt is firmly against this at this time.  She instead wants to use OTC supplements and essential oils.  Will follow.

## 2016-07-18 NOTE — Assessment & Plan Note (Signed)
New to provider, ongoing for pt.  She is not interested in quitting at this time.

## 2016-07-18 NOTE — Assessment & Plan Note (Signed)
New to provider, ongoing for pt.  s/p resection x2 w/ clear margins.  Continues to follow w/ Dr Burr Medico.  Will follow along.

## 2016-07-19 ENCOUNTER — Telehealth: Payer: Self-pay | Admitting: Hematology

## 2016-07-19 DIAGNOSIS — S22089D Unspecified fracture of T11-T12 vertebra, subsequent encounter for fracture with routine healing: Secondary | ICD-10-CM | POA: Diagnosis not present

## 2016-07-19 DIAGNOSIS — M546 Pain in thoracic spine: Secondary | ICD-10-CM | POA: Diagnosis not present

## 2016-07-19 DIAGNOSIS — M4854XA Collapsed vertebra, not elsewhere classified, thoracic region, initial encounter for fracture: Secondary | ICD-10-CM | POA: Diagnosis not present

## 2016-07-19 NOTE — Telephone Encounter (Signed)
Patient bypassed scheduling on 07/15/16. Appointments scheduled per 04/27//18 los. Patient was mailed a copy of the appointment letter and schedule.

## 2016-07-21 ENCOUNTER — Encounter: Payer: Self-pay | Admitting: General Practice

## 2016-07-22 ENCOUNTER — Telehealth: Payer: Self-pay

## 2016-07-22 NOTE — Telephone Encounter (Signed)
s/w pt and gave her tumor marker report.

## 2016-07-25 DIAGNOSIS — S22089D Unspecified fracture of T11-T12 vertebra, subsequent encounter for fracture with routine healing: Secondary | ICD-10-CM | POA: Diagnosis not present

## 2016-07-25 DIAGNOSIS — M4854XA Collapsed vertebra, not elsewhere classified, thoracic region, initial encounter for fracture: Secondary | ICD-10-CM | POA: Diagnosis not present

## 2016-07-25 DIAGNOSIS — M546 Pain in thoracic spine: Secondary | ICD-10-CM | POA: Diagnosis not present

## 2016-08-01 DIAGNOSIS — M546 Pain in thoracic spine: Secondary | ICD-10-CM | POA: Diagnosis not present

## 2016-08-01 DIAGNOSIS — M4854XA Collapsed vertebra, not elsewhere classified, thoracic region, initial encounter for fracture: Secondary | ICD-10-CM | POA: Diagnosis not present

## 2016-08-01 DIAGNOSIS — S22089D Unspecified fracture of T11-T12 vertebra, subsequent encounter for fracture with routine healing: Secondary | ICD-10-CM | POA: Diagnosis not present

## 2016-08-08 DIAGNOSIS — M4854XA Collapsed vertebra, not elsewhere classified, thoracic region, initial encounter for fracture: Secondary | ICD-10-CM | POA: Diagnosis not present

## 2016-08-08 DIAGNOSIS — M546 Pain in thoracic spine: Secondary | ICD-10-CM | POA: Diagnosis not present

## 2016-08-08 DIAGNOSIS — S22089D Unspecified fracture of T11-T12 vertebra, subsequent encounter for fracture with routine healing: Secondary | ICD-10-CM | POA: Diagnosis not present

## 2016-08-10 DIAGNOSIS — S22089D Unspecified fracture of T11-T12 vertebra, subsequent encounter for fracture with routine healing: Secondary | ICD-10-CM | POA: Diagnosis not present

## 2016-08-10 DIAGNOSIS — M4854XA Collapsed vertebra, not elsewhere classified, thoracic region, initial encounter for fracture: Secondary | ICD-10-CM | POA: Diagnosis not present

## 2016-08-10 DIAGNOSIS — M546 Pain in thoracic spine: Secondary | ICD-10-CM | POA: Diagnosis not present

## 2016-08-17 ENCOUNTER — Telehealth: Payer: Self-pay | Admitting: Family Medicine

## 2016-08-17 DIAGNOSIS — S22089D Unspecified fracture of T11-T12 vertebra, subsequent encounter for fracture with routine healing: Secondary | ICD-10-CM | POA: Diagnosis not present

## 2016-08-17 DIAGNOSIS — M4854XA Collapsed vertebra, not elsewhere classified, thoracic region, initial encounter for fracture: Secondary | ICD-10-CM | POA: Diagnosis not present

## 2016-08-17 DIAGNOSIS — M546 Pain in thoracic spine: Secondary | ICD-10-CM | POA: Diagnosis not present

## 2016-08-17 NOTE — Telephone Encounter (Signed)
Pt dropped off DMV form, placed with charge sheet in bin up front for KT.

## 2016-08-18 NOTE — Telephone Encounter (Signed)
LM making pt aware that forms are ready for pick up.

## 2016-08-18 NOTE — Telephone Encounter (Signed)
Done and placed in basket

## 2016-08-22 DIAGNOSIS — M810 Age-related osteoporosis without current pathological fracture: Secondary | ICD-10-CM | POA: Diagnosis not present

## 2016-08-23 DIAGNOSIS — S22089D Unspecified fracture of T11-T12 vertebra, subsequent encounter for fracture with routine healing: Secondary | ICD-10-CM | POA: Diagnosis not present

## 2016-08-23 DIAGNOSIS — M546 Pain in thoracic spine: Secondary | ICD-10-CM | POA: Diagnosis not present

## 2016-08-23 DIAGNOSIS — M4854XA Collapsed vertebra, not elsewhere classified, thoracic region, initial encounter for fracture: Secondary | ICD-10-CM | POA: Diagnosis not present

## 2016-08-25 ENCOUNTER — Ambulatory Visit (INDEPENDENT_AMBULATORY_CARE_PROVIDER_SITE_OTHER): Payer: PPO | Admitting: Family Medicine

## 2016-08-25 ENCOUNTER — Encounter: Payer: Self-pay | Admitting: Family Medicine

## 2016-08-25 VITALS — BP 123/83 | HR 90 | Temp 98.1°F | Resp 16 | Ht 66.0 in | Wt 98.1 lb

## 2016-08-25 DIAGNOSIS — R5383 Other fatigue: Secondary | ICD-10-CM

## 2016-08-25 DIAGNOSIS — R634 Abnormal weight loss: Secondary | ICD-10-CM | POA: Diagnosis not present

## 2016-08-25 LAB — CBC WITH DIFFERENTIAL/PLATELET
Basophils Absolute: 0.1 10*3/uL (ref 0.0–0.1)
Basophils Relative: 1 % (ref 0.0–3.0)
Eosinophils Absolute: 0.1 10*3/uL (ref 0.0–0.7)
Eosinophils Relative: 1.3 % (ref 0.0–5.0)
HCT: 44.4 % (ref 36.0–46.0)
Hemoglobin: 14.9 g/dL (ref 12.0–15.0)
Lymphocytes Relative: 34.8 % (ref 12.0–46.0)
Lymphs Abs: 1.8 10*3/uL (ref 0.7–4.0)
MCHC: 33.6 g/dL (ref 30.0–36.0)
MCV: 92.5 fl (ref 78.0–100.0)
Monocytes Absolute: 0.4 10*3/uL (ref 0.1–1.0)
Monocytes Relative: 8.3 % (ref 3.0–12.0)
Neutro Abs: 2.9 10*3/uL (ref 1.4–7.7)
Neutrophils Relative %: 54.6 % (ref 43.0–77.0)
Platelets: 196 10*3/uL (ref 150.0–400.0)
RBC: 4.8 Mil/uL (ref 3.87–5.11)
RDW: 13.5 % (ref 11.5–15.5)
WBC: 5.3 10*3/uL (ref 4.0–10.5)

## 2016-08-25 LAB — BASIC METABOLIC PANEL
BUN: 13 mg/dL (ref 6–23)
CO2: 29 mEq/L (ref 19–32)
Calcium: 9.5 mg/dL (ref 8.4–10.5)
Chloride: 99 mEq/L (ref 96–112)
Creatinine, Ser: 0.56 mg/dL (ref 0.40–1.20)
GFR: 113.25 mL/min (ref >60.00)
Glucose, Bld: 106 mg/dL — ABNORMAL HIGH (ref 70–99)
Potassium: 4.8 mEq/L (ref 3.5–5.1)
Sodium: 135 mEq/L (ref 135–145)

## 2016-08-25 LAB — HEPATIC FUNCTION PANEL
ALT: 14 U/L (ref 0–35)
AST: 18 U/L (ref 0–37)
Albumin: 4.2 g/dL (ref 3.5–5.2)
Alkaline Phosphatase: 59 U/L (ref 39–117)
Bilirubin, Direct: 0.1 mg/dL (ref 0.0–0.3)
Total Bilirubin: 0.6 mg/dL (ref 0.2–1.2)
Total Protein: 6.4 g/dL (ref 6.0–8.3)

## 2016-08-25 LAB — TSH: TSH: 0.49 u[IU]/mL (ref 0.35–4.50)

## 2016-08-25 LAB — VITAMIN D 25 HYDROXY (VIT D DEFICIENCY, FRACTURES): VITD: 42.1 ng/mL (ref 30.00–100.00)

## 2016-08-25 LAB — B12 AND FOLATE PANEL
Folate: >24 ng/mL (ref >5.9)
Vitamin B-12: 1127 pg/mL — ABNORMAL HIGH (ref 211–911)

## 2016-08-25 NOTE — Patient Instructions (Signed)
Follow up in 1 month to recheck weight and fatigue We'll notify you of your lab results and make any changes if needed Make sure you are eating regularly- at least 3 meals/day We'll call you with your nutrition appt Call with any questions or concerns Hang in there!!!

## 2016-08-25 NOTE — Progress Notes (Signed)
   Subjective:    Patient ID: Tina Bailey, female    DOB: Sep 02, 1945, 71 y.o.   MRN: [id number]  HPI Fatigue- pt reports she will have waves of 'overwhelming so tired that I can't sit up straight'.  Pt is going to rehab and 'it's all I can do to get home and lie down'.  Pt has Hepatocellular Carcinoma and is seeing oncology.  Pt is concerned she is losing weight.  Pt is eating sporadically and 'I can never eat very much'.  Pt admits she doesn't have adequate calorie intake.   Review of Systems For ROS see HPI     Objective:   Physical Exam  Constitutional: She is oriented to person, place, and time. No distress.  Thin, frail appearing  HENT:  Head: Normocephalic and atraumatic.  Eyes: Conjunctivae and EOM are normal. Pupils are equal, round, and reactive to light.  Neck: Normal range of motion. Neck supple. No thyromegaly present.  Cardiovascular: Normal rate, regular rhythm, normal heart sounds and intact distal pulses.   No murmur heard. Pulmonary/Chest: Effort normal and breath sounds normal. No respiratory distress.  Abdominal: Soft. She exhibits no distension. There is no tenderness.  Musculoskeletal: She exhibits no edema.  Lymphadenopathy:    She has no cervical adenopathy.  Neurological: She is alert and oriented to person, place, and time.  Skin: Skin is warm and dry.  Psychiatric: She has a normal mood and affect. Her behavior is normal.  Vitals reviewed.         Assessment & Plan:  Fatigue- new.  I suspect pt's fatigue is due to the fact that she is not eating enough to sustain her- especially in the setting of PT.  Also must consider her multiple serious medical conditions.  Check labs to r/o metabolic causes of fatigue.  Stressed need to eat at least 3 meals daily and add frequent snacks.  Will follow.  Weight loss- pt admits she is eating poorly and not regularly.  Stressed need for her to increase her protein intake and eat more regularly throughout the  day.  Refer to nutritionist for ongoing management.  Will follow closely.

## 2016-08-25 NOTE — Progress Notes (Signed)
Pre visit review using our clinic review tool, if applicable. No additional management support is needed unless otherwise documented below in the visit note. 

## 2016-08-29 DIAGNOSIS — S22089D Unspecified fracture of T11-T12 vertebra, subsequent encounter for fracture with routine healing: Secondary | ICD-10-CM | POA: Diagnosis not present

## 2016-08-29 DIAGNOSIS — M4854XA Collapsed vertebra, not elsewhere classified, thoracic region, initial encounter for fracture: Secondary | ICD-10-CM | POA: Diagnosis not present

## 2016-08-29 DIAGNOSIS — M546 Pain in thoracic spine: Secondary | ICD-10-CM | POA: Diagnosis not present

## 2016-08-31 DIAGNOSIS — S22089D Unspecified fracture of T11-T12 vertebra, subsequent encounter for fracture with routine healing: Secondary | ICD-10-CM | POA: Diagnosis not present

## 2016-08-31 DIAGNOSIS — M4854XA Collapsed vertebra, not elsewhere classified, thoracic region, initial encounter for fracture: Secondary | ICD-10-CM | POA: Diagnosis not present

## 2016-08-31 DIAGNOSIS — M546 Pain in thoracic spine: Secondary | ICD-10-CM | POA: Diagnosis not present

## 2016-09-05 DIAGNOSIS — S22000A Wedge compression fracture of unspecified thoracic vertebra, initial encounter for closed fracture: Secondary | ICD-10-CM | POA: Diagnosis not present

## 2016-09-05 DIAGNOSIS — G894 Chronic pain syndrome: Secondary | ICD-10-CM | POA: Diagnosis not present

## 2016-09-05 DIAGNOSIS — M546 Pain in thoracic spine: Secondary | ICD-10-CM | POA: Diagnosis not present

## 2016-09-05 DIAGNOSIS — S22089D Unspecified fracture of T11-T12 vertebra, subsequent encounter for fracture with routine healing: Secondary | ICD-10-CM | POA: Diagnosis not present

## 2016-09-07 DIAGNOSIS — S22089D Unspecified fracture of T11-T12 vertebra, subsequent encounter for fracture with routine healing: Secondary | ICD-10-CM | POA: Diagnosis not present

## 2016-09-07 DIAGNOSIS — M546 Pain in thoracic spine: Secondary | ICD-10-CM | POA: Diagnosis not present

## 2016-09-07 DIAGNOSIS — M4854XA Collapsed vertebra, not elsewhere classified, thoracic region, initial encounter for fracture: Secondary | ICD-10-CM | POA: Diagnosis not present

## 2016-09-19 ENCOUNTER — Encounter: Payer: Self-pay | Admitting: Dietician

## 2016-09-19 ENCOUNTER — Encounter: Payer: PPO | Attending: Family Medicine | Admitting: Dietician

## 2016-09-19 DIAGNOSIS — R634 Abnormal weight loss: Secondary | ICD-10-CM | POA: Insufficient documentation

## 2016-09-19 DIAGNOSIS — Z713 Dietary counseling and surveillance: Secondary | ICD-10-CM | POA: Diagnosis not present

## 2016-09-19 DIAGNOSIS — E440 Moderate protein-calorie malnutrition: Secondary | ICD-10-CM

## 2016-09-19 NOTE — Patient Instructions (Addendum)
Consider quitting smoking. (More money, food tastes better, maybe it could help you gain weight.) Get and keep some basic staples in your pantry.  You need 3 meals and 2 snacks per day. Try cooking something you enjoy once per week.  Drink a protein shake twice per day.  It could be:  [employer] made with whole milk  Ensure  Smoothie (Banana, peanut butter, whole milk, Carnation Breakfast Essentials) or (Banana frozen peaches or frozen mangos, yogurt or Carnation Breakfast, whole milk)  Breakfast:  Have this when you wake.  Options include:  Cream of wheat, butter, sugar, made with whole milk, fruit  Toast with peanut butter, fruit  Toast with egg, fruit  Whole milk Yogurt with fruit   Cereal with whole milk, fruit  Lunch:  Soup and 1/2 sandwich  Soup, crackers, and cheese  Bologna sandwich, chips with cream cheese picante dip  Tuna salad with crackers or bread, fruit  Dinner:  See My Plate  Angel hair pasta, shrimp, butter, green peas  "Hot dish" (casserole), vegetables  Meat, potatoes, vegetables  Snack ideas:  Pudding made with whole milk  [place] yogurt and fruit  Protein shake (see above)  Crackers and cheese  Peanut butter and crackers    To increase your calories:  Use Whole milk  Season with Butter  Regular sour cream

## 2016-09-19 NOTE — Progress Notes (Signed)
Medical Nutrition Therapy:  Appt start time: 1540 end time:  1650.   Assessment:  Primary concerns today: Patient is here alone. Concerns that she is not eating adequate amounts.  She is at her lowest adult weight.  She states that her appetite is fair.  She states that she has more problems when with pain and also eating when she has not had the opoid for a time.  Patient states that she tries to be careful with how much of this she is taking but feels that she is addicted at times.  She states that she just finished PT at outpatient rehab in [place].  She has concerns that she is weak and lacks core muscle strength.  We discussed the importance of continuing these exercises.  History includes liver cancer with resection 02/2015, severe osteoporosis, COPD, and continues to smoke.  She has weight loss of 12% in the past year and noted decreased body fat and muscle mass.  Expect mild/moderate malnutrition due to inadequate intake.  Weight hx:   98 lbs today.  Her weight has been stable for the past month.  This is her lowest adult weight.  BMI is 15.8 111 lbs about one year ago.   135 lbs prior to her surgery for cancer in 02/2015 She states that she has never weighed more than 138 lbs.  Patient lives with her daughter and son-in-law.  No one cooks much.  Her son-in-law eats out, daughter just gets things that are fast and Cicilia has been too tired to cook and using a lot of canned/processed foods.  She moved from [place] 8 years ago.  She is retired from [employer]ist, product/process development.  She likes to shop for herself but has not felt well recently.  Wt Readings from Last 3 Encounters:  08/25/16 98 lb 2 oz (44.5 kg)  07/15/16 100 lb 1.6 oz (45.4 kg)  07/13/16 101 lb 2 oz (45.9 kg)   Preferred Learning Style:   No preference indicated   Learning Readiness:   Ready  MEDICATIONS: see list   DIETARY INTAKE: "I eat when I'm hungry." Usual eating pattern includes 2 meals and 1  snacks per day. Since her gall bladder was removed, she cannot tolerate onions and high fat foods. Everyday foods include Ensure, canned soup, snack foods.   24-hr recall:  B (11 AM): Ensure OR boiled egg, toast OR cereal and whole milk, sometimes with fruit Snk ( AM): occasional potato chips  L ( PM): if she eats a late breakfast, she skips lunch OR chicken noodle soup OR Ramen Snk ( PM): crackers OR sandwich OR popsickle  D ( PM): 1/2 can of soup (chicken dumpling) and peas OR 1/2 hamburger Snk ( PM): Ensure Beverages: water, 1 cup coffee per day with sugar,  1 Ensure per day,1 can regular soda per week  Usual physical activity: limited.  Just finished rehab  Estimated energy needs: 1800 calories 65 g protein  Progress Towards Goal(s):  In progress.   Nutritional Diagnosis:  Bern-3.1 Underweight As related to inadequate oral intake.  As evidenced by BMI of 15.8.  She also meets criteria for mild/moderate malnutrition.    Intervention:  Nutrition counseling/edcation to increase calories and nutrient dense foods and increase weight. Consider quitting smoking. (More money, food tastes better, maybe it could help you gain weight.)  Get and keep some basic staples in your pantry.  You need 3 meals and 2 snacks per day. Try cooking something you enjoy  once per week.  Drink a protein shake twice per day.  It could be:  [employer] made with whole milk  Ensure  Smoothie (Banana, peanut butter, whole milk, Carnation Breakfast Essentials) or (Banana frozen peaches or frozen mangos, yogurt or Carnation Breakfast, whole milk)  Breakfast:  Have this when you wake.  Options include:  Cream of wheat, butter, sugar, made with whole milk, fruit  Toast with peanut butter, fruit  Toast with egg, fruit  Whole milk Yogurt with fruit   Cereal with whole milk, fruit  Lunch:  Soup and 1/2 sandwich  Soup, crackers, and cheese  Bologna sandwich, chips with cream cheese picante  dip  Tuna salad with crackers or bread, fruit  Dinner:  See My Plate  Angel hair pasta, shrimp, butter, green peas  "Hot dish" (casserole), vegetables  Meat, potatoes, vegetables  Snack ideas:  Pudding made with whole milk  Fage yogurt and fruit  Protein shake (see above)  Crackers and cheese  Peanut butter and crackers    To increase your calories:  Use Whole milk  Season with Butter  Regular sour cream  Teaching Method Utilized:  Visual Auditory Hands on  Handouts given during visit include:  Underweight nutrition therapy  My plate  Orgain nutrition sample  NIKE Essentials Sample  Coupons for Boost  Barriers to learning/adherence to lifestyle change: energy, lack of support  Demonstrated degree of understanding via:  Teach Back   Monitoring/Evaluation:  Dietary intake, exercise, and body weight prn.

## 2016-09-20 ENCOUNTER — Telehealth: Payer: Self-pay

## 2016-09-20 NOTE — Telephone Encounter (Signed)
LM requesting call back. Offering patient to complete AWV with Health Coach on 09/27/2016 @ 11:30 (after f/u with PCP) instead of 10/12/2016.

## 2016-09-20 NOTE — Telephone Encounter (Signed)
Patient called back - She stated that she needed to just keep her appointments as they are right now (with the AWV on the 25th).  Message routed to Health Coach to let her know

## 2016-09-23 ENCOUNTER — Ambulatory Visit (INDEPENDENT_AMBULATORY_CARE_PROVIDER_SITE_OTHER): Payer: PPO | Admitting: Physician Assistant

## 2016-09-23 ENCOUNTER — Encounter: Payer: Self-pay | Admitting: Physician Assistant

## 2016-09-23 VITALS — BP 112/78 | HR 95 | Temp 97.8°F | Resp 14 | Ht 66.0 in | Wt 98.0 lb

## 2016-09-23 DIAGNOSIS — L509 Urticaria, unspecified: Secondary | ICD-10-CM | POA: Diagnosis not present

## 2016-09-23 DIAGNOSIS — T63441A Toxic effect of venom of bees, accidental (unintentional), initial encounter: Secondary | ICD-10-CM | POA: Diagnosis not present

## 2016-09-23 MED ORDER — RANITIDINE HCL 150 MG PO CAPS: 150.0000 mg | ORAL_CAPSULE | Freq: Every day | ORAL | 0 refills | Status: DC

## 2016-09-23 NOTE — Patient Instructions (Signed)
Please continue Benadryl (1/2 tablet) at bedtime. Avoid heat as this makes symptoms worse. Ice will help with itch and swelling. There should be no worsening of the local reaction to the bee sting from this point forward. If there are any worsening symptoms, this would be concerning for infection and you need to be seen.  For the hives -- I am concerned that you are being exposed to something in the bed. Wash all bed linens in hot water. No pets in the room. Again Benadryl (1/2 tablet) at night as noted above. Take the Rantidine (Zantac) as directed once daily.  Follow-up with Dr. Birdie Riddle as scheduled next week. If you note any shortness of breath or persistent hives, please go to the ER.

## 2016-09-23 NOTE — Progress Notes (Signed)
Patient presents to clinic today c/o bee sting to left proximal third phalanx sustained on Wednesday afternoon while outside in the yard. Noted pain at onset of sting. Notes mild swelling around the area developing later in the day. Denied rash, racing heart, shortness of breath or tongue swelling. Denies prior reaction to bee sting. States the next day she noted the area was swollen more and itching. Took a Benadryl which helped significantly with swelling and itch but made her sleepy. Symptoms today are much improved but still present. Daughter recommended she have assessment.  Of note patient also endorses having a couple of episodes of hives occurring in the middle of the night over the past week. Notes this started this past weekend, several days before the bee sting. Endorses waking her up from sleep due to pruritus. Resolved with Benadryl. Denies SOB, chest tightness or difficulty swallowing. Denies .  Past Medical History:  Diagnosis Date  . Arthritis    DDD. Right shoulder"is frozen"-limited ROM. osteoporosis.  . Blue toe syndrome ([place]) 11/27/2014   Dr. Claudia Pollock evaluating  . Cancer ([place]) 12/2014   liver cancer  . Complication of anesthesia   . COPD (chronic obstructive pulmonary disease) ([place])   . Fibromyalgia   . Fracture of rib of right side    hx "osteoporosis"- states her dog nudge her on the side, next day developed great pain and was told has a fracture rib.  . Macular degeneration    R eye  . Osteoporosis   . PONV (postoperative nausea and vomiting)    nausea,severe vomiting after 01-13-15 portal vein embolization  . Productive cough   . Retina disorder    L eye, vision distorted, edema  . Spine fracture due to birth trauma   . TMJ disease   . Wears glasses     Current Outpatient Prescriptions on File Prior to Visit  Medication Sig Dispense Refill  . AMINO ACIDS COMPLEX PO Take by mouth.    Marland Kitchen aspirin-acetaminophen-caffeine (EXCEDRIN MIGRAINE) 250-250-65 MG  tablet Take by mouth every 6 (six) hours as needed for headache.    . Calcium Carbonate-Vitamin D (CALCIUM 500+D HIGH POTENCY PO) Take by mouth.    . Cyanocobalamin (VITAMIN B-12) 1000 MCG SUBL Place under the tongue.    . DHA-EPA-Flaxseed Oil-Vitamin E (THERA TEARS NUTRITION PO) Take by mouth.    . ENSURE (ENSURE) Take 237 mLs by mouth.    Marland Kitchen HYDROcodone-acetaminophen (NORCO/VICODIN) 5-325 MG tablet Take 1 tablet by mouth as needed.    . Multiple Vitamin (MULTIVITAMIN WITH MINERALS) TABS tablet Take 1 tablet by mouth daily.    . Omega-3 Fatty Acids (OMEGA-3 FISH OIL PO) Take 1 tablet by mouth 3 (three) times daily. [employer] brand    . VITAMIN D, CHOLECALCIFEROL, PO Take 5,000 Units by mouth daily.     No current facility-administered medications on file prior to visit.     Allergies  Allergen Reactions  . Amoxicillin-Pot Clavulanate Nausea Only    Has patient had a PCN reaction causing immediate rash, facial/tongue/throat swelling, SOB or lightheadedness with hypotension: No Has patient had a PCN reaction causing severe rash involving mucus membranes or skin necrosis: No Has patient had a PCN reaction that required hospitalization No Has patient had a PCN reaction occurring within the last 10 years: No If all of the above answers are "NO", then may proceed with Cephalosporin use.   . Paroxetine Hcl Rash  . Sulfa Antibiotics Rash    Family History  Problem Relation  Age of Onset  . Hypertension Mother   . Atrial fibrillation Mother   . Heart disease Mother        after age 44  . Stroke Father   . Dementia Father   . Heart disease Brother        After age 21- A-Fib  . Heart attack Brother     Social History   Social History  . Marital status: Divorced    Spouse name: N/A  . Number of children: N/A  . Years of education: N/A   Social History Main Topics  . Smoking status: Current Every Day Smoker    Packs/day: 1.00    Years: 45.00    Types: Cigarettes  . Smokeless  tobacco: Never Used  . Alcohol use No  . Drug use: No  . Sexual activity: Not Asked   Other Topics Concern  . None   Social History Narrative  . None   Review of Systems - See HPI.  All other ROS are negative.  BP 112/78   Pulse 95   Temp 97.8 F (36.6 C) (Oral)   Resp 14   Ht 5' 6"  (1.676 m)   Wt 98 lb (44.5 kg)   SpO2 97%   BMI 15.82 kg/m   Physical Exam  Constitutional: She is oriented to person, place, and time and well-developed, well-nourished, and in no distress.  HENT:  Head: Normocephalic and atraumatic.  Eyes: Conjunctivae are normal.  Neck: Neck supple.  Cardiovascular: Normal rate, regular rhythm, normal heart sounds and intact distal pulses.   Pulmonary/Chest: Effort normal and breath sounds normal. No respiratory distress. She has no wheezes. She has no rales. She exhibits no tenderness.  Neurological: She is alert and oriented to person, place, and time.  Skin: Skin is warm and dry. No rash noted.     Psychiatric: Affect normal.  Vitals reviewed.   Recent Results (from the past 2160 hour(s))  POCT urinalysis dipstick     Status: Abnormal   Collection Time: 07/13/16  2:31 PM  Result Value Ref Range   Color, UA yellow    Clarity, UA clear    Glucose, UA negative    Bilirubin, UA negative    Ketones, UA negative    Spec Grav, UA 1.010 1.010 - 1.025   Blood, UA negative    pH, UA 6.0 5.0 - 8.0   Protein, UA negative    Urobilinogen, UA 0.2 0.2 or 1.0 E.U./dL   Nitrite, UA negative    Leukocytes, UA Trace (A) Negative  Urine culture     Status: None   Collection Time: 07/13/16  4:41 PM  Result Value Ref Range   Organism ID, Bacteria NO GROWTH   CBC with Differential     Status: None   Collection Time: 07/15/16  2:08 PM  Result Value Ref Range   WBC 7.7 3.9 - 10.3 10e3/uL   NEUT# 5.1 1.5 - 6.5 10e3/uL   HGB 14.4 11.6 - 15.9 g/dL   HCT 43.6 34.8 - 46.6 %   Platelets 184 145 - 400 10e3/uL   MCV 92.2 79.5 - 101.0 fL   MCH 30.4 25.1 - 34.0 pg     MCHC 33.0 31.5 - 36.0 g/dL   RBC 4.73 3.70 - 5.45 10e6/uL   RDW 13.5 11.2 - 14.5 %   lymph# 1.9 0.9 - 3.3 10e3/uL   MONO# 0.7 0.1 - 0.9 10e3/uL   Eosinophils Absolute 0.1 0.0 - 0.5 10e3/uL   Basophils Absolute  0.0 0.0 - 0.1 10e3/uL   NEUT% 65.8 38.4 - 76.8 %   LYMPH% 24.0 14.0 - 49.7 %   MONO% 8.8 0.0 - 14.0 %   EOS% 1.0 0.0 - 7.0 %   BASO% 0.4 0.0 - 2.0 %  Comprehensive metabolic panel     Status: Abnormal   Collection Time: 07/15/16  2:08 PM  Result Value Ref Range   Sodium 136 136 - 145 mEq/L   Potassium 4.4 3.5 - 5.1 mEq/L   Chloride 98 98 - 109 mEq/L   CO2 30 (H) 22 - 29 mEq/L   Glucose 119 70 - 140 mg/dl    Comment: Glucose reference range is for nonfasting patients. Fasting glucose reference range is 70- 100.   BUN 13.6 7.0 - 26.0 mg/dL   Creatinine 0.7 0.6 - 1.1 mg/dL   Total Bilirubin 0.37 0.20 - 1.20 mg/dL   Alkaline Phosphatase 74 40 - 150 U/L   AST 23 5 - 34 U/L   ALT 18 0 - 55 U/L   Total Protein 6.9 6.4 - 8.3 g/dL   Albumin 4.1 3.5 - 5.0 g/dL   Calcium 9.7 8.4 - 10.4 mg/dL   Anion Gap 9 3 - 11 mEq/L   EGFR 89 (L) >90 ml/min/1.73 m2    Comment: eGFR is calculated using the CKD-EPI Creatinine Equation (2009)  AFP tumor marker     Status: None   Collection Time: 07/15/16  2:09 PM  Result Value Ref Range   AFP, Serum, Tumor Marker 3.6 0.0 - 8.3 ng/mL    Comment: Roche ECLIA methodology  Basic metabolic panel     Status: Abnormal   Collection Time: 08/25/16  9:09 AM  Result Value Ref Range   Sodium 135 135 - 145 mEq/L   Potassium 4.8 3.5 - 5.1 mEq/L   Chloride 99 96 - 112 mEq/L   CO2 29 19 - 32 mEq/L   Glucose, Bld 106 (H) 70 - 99 mg/dL   BUN 13 6 - 23 mg/dL   Creatinine, Ser 0.56 0.40 - 1.20 mg/dL   Calcium 9.5 8.4 - 10.5 mg/dL   GFR 113.25 >60.00 mL/min  TSH     Status: None   Collection Time: 08/25/16  9:09 AM  Result Value Ref Range   TSH 0.49 0.35 - 4.50 uIU/mL  Hepatic function panel     Status: None   Collection Time: 08/25/16  9:09 AM   Result Value Ref Range   Total Bilirubin 0.6 0.2 - 1.2 mg/dL   Bilirubin, Direct 0.1 0.0 - 0.3 mg/dL   Alkaline Phosphatase 59 39 - 117 U/L   AST 18 0 - 37 U/L   ALT 14 0 - 35 U/L   Total Protein 6.4 6.0 - 8.3 g/dL   Albumin 4.2 3.5 - 5.2 g/dL  CBC with Differential/Platelet     Status: None   Collection Time: 08/25/16  9:09 AM  Result Value Ref Range   WBC 5.3 4.0 - 10.5 K/uL   RBC 4.80 3.87 - 5.11 Mil/uL   Hemoglobin 14.9 12.0 - 15.0 g/dL   HCT 44.4 36.0 - 46.0 %   MCV 92.5 78.0 - 100.0 fl   MCHC 33.6 30.0 - 36.0 g/dL   RDW 13.5 11.5 - 15.5 %   Platelets 196.0 150.0 - 400.0 K/uL   Neutrophils Relative % 54.6 43.0 - 77.0 %   Lymphocytes Relative 34.8 12.0 - 46.0 %   Monocytes Relative 8.3 3.0 - 12.0 %   Eosinophils  Relative 1.3 0.0 - 5.0 %   Basophils Relative 1.0 0.0 - 3.0 %   Neutro Abs 2.9 1.4 - 7.7 K/uL   Lymphs Abs 1.8 0.7 - 4.0 K/uL   Monocytes Absolute 0.4 0.1 - 1.0 K/uL   Eosinophils Absolute 0.1 0.0 - 0.7 K/uL   Basophils Absolute 0.1 0.0 - 0.1 K/uL  VITAMIN D 25 Hydroxy (Vit-D Deficiency, Fractures)     Status: None   Collection Time: 08/25/16  9:09 AM  Result Value Ref Range   VITD 42.10 30.00 - 100.00 ng/mL  B12 and Folate Panel     Status: Abnormal   Collection Time: 08/25/16  9:09 AM  Result Value Ref Range   Vitamin B-12 1,127 (H) 211 - 911 pg/mL   Folate >24.0 >5.9 ng/mL    Assessment/Plan: 1. Bee sting reaction, accidental or unintentional, initial encounter Local reaction with swelling and pruritus. Improving daily. No systemic symptoms since sting. Vitals and exam are stable. No evidence of cellulitis. Antihistamines as directed. Supportive measures reviewed. Strict return precautions given. Patient has follow-up with her PCP on Tuesday.   - ranitidine (ZANTAC) 150 MG capsule; Take 1 capsule (150 mg total) by mouth daily.  Dispense: 15 capsule; Refill: 0  2. Urticaria Onset prior to bee sting so unrelated. Unclear etiology. Is occurring mainly at  night after patient has been in bed for a while. Concern for some type of allergen in the bedroom. Continue Benadryl at night. Start Zantac each AM. Home measures for patient to take were discussed in detail. Follow-up scheduled for next week. May need referral to allergist.    Leeanne Rio, PA-C

## 2016-09-23 NOTE — Progress Notes (Signed)
Pre visit review using our clinic review tool, if applicable. No additional management support is needed unless otherwise documented below in the visit note. 

## 2016-09-27 ENCOUNTER — Encounter: Payer: Self-pay | Admitting: Family Medicine

## 2016-09-27 ENCOUNTER — Other Ambulatory Visit: Payer: Self-pay | Admitting: General Practice

## 2016-09-27 ENCOUNTER — Ambulatory Visit (INDEPENDENT_AMBULATORY_CARE_PROVIDER_SITE_OTHER): Payer: PPO | Admitting: Family Medicine

## 2016-09-27 DIAGNOSIS — R4589 Other symptoms and signs involving emotional state: Secondary | ICD-10-CM | POA: Insufficient documentation

## 2016-09-27 DIAGNOSIS — F418 Other specified anxiety disorders: Secondary | ICD-10-CM | POA: Diagnosis not present

## 2016-09-27 MED ORDER — SERTRALINE HCL 25 MG PO TABS: 25.0000 mg | ORAL_TABLET | Freq: Every day | ORAL | 3 refills | Status: DC

## 2016-09-27 NOTE — Patient Instructions (Addendum)
Follow up in 3-4 weeks to recheck mood Start the Sertraline once daily to help w/ anxiety Please take your medications as directed- it would help to make a medication schedule like the one we printed Call with any questions or concerns Hang in there!!!

## 2016-09-27 NOTE — Progress Notes (Signed)
Pre visit review using our clinic review tool, if applicable. No additional management support is needed unless otherwise documented below in the visit note. 

## 2016-09-27 NOTE — Assessment & Plan Note (Signed)
New.  Pt is extremely anxious today, making it almost impossible to follow her train of thought or make sense of what she is saying.  She is emotionally labile during our visit.  At this time, will start low dose Sertraline and monitor for improvement.  Printed out a medication schedule for her so she can stop using her notebook and things will be more clear.  Will follow closely.  Total time spent w/ pt, >30 minutes and more than 50% spent counseling on stress management and anxiety

## 2016-09-27 NOTE — Progress Notes (Signed)
   Subjective:    Patient ID: Tina Bailey, female    DOB: 05/14/1945, 71 y.o.   MRN: [id number]  HPI Anxiety- pt reports she has been very anxious for over a week now.  She is having flight of ideas, rapid- almost pressured speech.  She is very difficult to follow.  Suddenly becomes tearful and doesn't know why.  Is reading from a notebook where she writes things down but even those thoughts are difficult to follow.  After talking for 25 minutes, she suddenly stops and says, 'so what do you think?'.   Review of Systems For ROS see HPI     Objective:   Physical Exam  Constitutional:  Thin, frail appearing  HENT:  Head: Normocephalic and atraumatic.  Neurological: She is alert.  Skin: Skin is warm and dry. No rash noted. No erythema.  Psychiatric:  Rapid, almost pressured speech.  Tangential thought process, almost like flight of ideas.  Very difficult to follow. Emotional lability  Vitals reviewed.         Assessment & Plan:

## 2016-10-04 ENCOUNTER — Telehealth: Payer: Self-pay | Admitting: Family Medicine

## 2016-10-04 NOTE — Telephone Encounter (Signed)
Pt called to advise that the Rx for Zoloft is making her too sleepy and motor skills are off, pt has tried to cut back to 1/2 a tablet, but still making her too sleepy. Pt states that it has helped with anxiety, also pt asking if KT would call in an abx due to having an abscess tooth and still having issues finding a dentist that takes medicaid.

## 2016-10-05 MED ORDER — CITALOPRAM HYDROBROMIDE 10 MG PO TABS: 10.0000 mg | ORAL_TABLET | Freq: Every day | ORAL | 3 refills | Status: DC

## 2016-10-05 NOTE — Telephone Encounter (Signed)
Called and advised pt of PCP recommendations. She stated an understanding and will try calling insurance to find a dentist that will accept her insurance.

## 2016-10-05 NOTE — Telephone Encounter (Signed)
Glad the Zoloft is helping w/ anxiety but since she is struggling w/ excessive sleepiness we can switch to Citalopram 10mg  daily.  I am not able to prescribe an abx for a tooth abscess w/o an appt and I recommend she call the health dept and schedule with their dental clinic to at least have an appt for evaluation

## 2016-10-12 ENCOUNTER — Ambulatory Visit (INDEPENDENT_AMBULATORY_CARE_PROVIDER_SITE_OTHER): Payer: PPO

## 2016-10-12 VITALS — BP 128/78 | HR 87 | Ht 66.0 in | Wt 97.0 lb

## 2016-10-12 DIAGNOSIS — Z0000 Encounter for general adult medical examination without abnormal findings: Secondary | ICD-10-CM

## 2016-10-12 NOTE — Progress Notes (Addendum)
Subjective:   Tina Bailey is a 71 y.o. female who presents for an Initial Medicare Annual Wellness Visit.  Review of Systems    No ROS.  Medicare Wellness Visit. Additional risk factors are reflected in the social history.   Cardiac Risk Factors include: advanced age (>80men, >31 women);family history of premature cardiovascular disease;smoking/ tobacco exposure   Sleep patterns: Sleeps about 5 hours total, up several times at night.  Home Safety/Smoke Alarms: Feels safe in home. Smoke alarms in place.  Living environment; residence and Firearm Safety: Lives with daughter and son in law in 1 story home.  Seat Belt Safety/Bike Helmet: Wears seat belt.   Counseling:   Eye Exam-Last exam 2016, [facility]. Encouraged to schedule f/u appt.  Dental-Last exam > 2 years. Seeing Dr Tye Savoy (referred from Dr. Baxter Hire) for abscessed route canal  Female:   Pap-N/A      Mammo-Declines       Dexa scan-Last exam 09/2015, ordered by Dr. Dossie Der, next scheduled for 11/07/16      CCS-Declines.     Objective:    Today's Vitals   10/12/16 1412  BP: 128/78  Pulse: 87  SpO2: 95%  Weight: 97 lb (44 kg)  Height: 5\' 6"  (1.676 m)   Body mass index is 15.66 kg/m.   Current Medications (verified) Outpatient Encounter Prescriptions as of 10/12/2016  Medication Sig  . AMINO ACIDS COMPLEX PO Take by mouth.  Marland Kitchen aspirin-acetaminophen-caffeine (EXCEDRIN MIGRAINE) 250-250-65 MG tablet Take by mouth every 6 (six) hours as needed for headache.  . Calcium Carbonate-Vitamin D (CALCIUM 500+D HIGH POTENCY PO) Take by mouth.  . citalopram (CELEXA) 10 MG tablet Take 1 tablet (10 mg total) by mouth daily.  . Cod Liver Oil OIL Take by mouth.  . Cyanocobalamin (VITAMIN B-12) 1000 MCG SUBL Place under the tongue.  . DHA-EPA-Flaxseed Oil-Vitamin E (THERA TEARS NUTRITION PO) Take by mouth.  . ENSURE (ENSURE) Take 237 mLs by mouth.  Marland Kitchen HYDROcodone-acetaminophen (NORCO/VICODIN) 5-325 MG tablet Take 1 tablet by mouth as  needed.  . Multiple Vitamin (MULTIVITAMIN WITH MINERALS) TABS tablet Take 1 tablet by mouth daily.  Marland Kitchen VITAMIN D, CHOLECALCIFEROL, PO Take 5,000 Units by mouth daily.  . Omega-3 Fatty Acids (OMEGA-3 FISH OIL PO) Take 1 tablet by mouth 3 (three) times daily. [employer] brand  . ranitidine (ZANTAC) 150 MG capsule Take 1 capsule (150 mg total) by mouth daily. (Patient not taking: Reported on 09/27/2016)   No facility-administered encounter medications on file as of 10/12/2016.     Allergies (verified) Amoxicillin-pot clavulanate; Paroxetine hcl; and Sulfa antibiotics   History: Past Medical History:  Diagnosis Date  . Arthritis    DDD. Right shoulder"is frozen"-limited ROM. osteoporosis.  . Blue toe syndrome ([place]) 11/27/2014   Dr. Claudia Pollock evaluating  . Cancer ([place]) 12/2014   liver cancer  . Complication of anesthesia   . COPD (chronic obstructive pulmonary disease) ([place])   . Fibromyalgia   . Fracture of rib of right side    hx "osteoporosis"- states her dog nudge her on the side, next day developed great pain and was told has a fracture rib.  . Macular degeneration    R eye  . Osteoporosis   . PONV (postoperative nausea and vomiting)    nausea,severe vomiting after 01-13-15 portal vein embolization  . Productive cough   . Retina disorder    L eye, vision distorted, edema  . Spine fracture due to birth trauma   . TMJ disease   .  Wears glasses    Past Surgical History:  Procedure Laterality Date  . ANGIOPLASTY  2008   no stents required, no follow-up with cardiologist, no recurrent chest pain  . CATARACT EXTRACTION, BILATERAL Bilateral   . ESOPHAGOGASTRODUODENOSCOPY (EGD) WITH PROPOFOL N/A 06/25/2015   Procedure: ESOPHAGOGASTRODUODENOSCOPY (EGD) WITH PROPOFOL;  Surgeon: Milus Banister, MD;  Location: [facility];  Service: Endoscopy;  Laterality: N/A;  stent removal   . EYE SURGERY     cornea surgery  . KIDNEY STONE SURGERY    . LAPAROSCOPIC PARTIAL HEPATECTOMY N/A 03/04/2015    Procedure: DIAGNOSTIC LAPAROSCOPY, EXTENDED RIGHT HEPATECTOMY, WITH INTRAOPERATIVE ULTRASOUND;  Surgeon: Stark Klein, MD;  Location: [facility];  Service: General;  Laterality: N/A;  . PARTIAL HYSTERECTOMY    . portal vein embolization     01-13-15 -Dr. Cathlean Sauer.   Family History  Problem Relation Age of Onset  . Hypertension Mother   . Atrial fibrillation Mother   . Heart disease Mother        after age 30  . Stroke Father   . Dementia Father   . Heart disease Brother        After age 64- A-Fib  . Heart attack Brother    Social History   Occupational History  . Not on file.   Social History Main Topics  . Smoking status: Current Every Day Smoker    Packs/day: 1.00    Years: 45.00    Types: Cigarettes  . Smokeless tobacco: Never Used  . Alcohol use No  . Drug use: No  . Sexual activity: Not on file    Tobacco Counseling Ready to quit: Yes Counseling given: Yes Patient states insurance will only cover Chantix, which patient declines.   Activities of Daily Living In your present state of health, do you have any difficulty performing the following activities: 10/12/2016 07/13/2016  Hearing? N N  Vision? N Y  Difficulty concentrating or making decisions? Y N  Walking or climbing stairs? Y N  Dressing or bathing? N N  Doing errands, shopping? N N  Preparing Food and eating ? N -  Using the Toilet? N -  In the past six months, have you accidently leaked urine? N -  Do you have problems with loss of bowel control? N -  Managing your Medications? N -  Managing your Finances? N -  Housekeeping or managing your Housekeeping? N -  Some recent data might be hidden    Immunizations and Health Maintenance Immunization History  Administered Date(s) Administered  . Tdap 09/13/2013   There are no preventive care reminders to display for this patient.  Patient Care Team: Midge Minium, MD as PCP - General (Family Medicine) Elam Dutch, MD as Consulting  Physician (Vascular Surgery) Stark Klein, MD as Consulting Physician (General Surgery) Truitt Merle, MD as Consulting Physician (Hematology) Suella Broad, MD as Consulting Physician (Physical Medicine and Rehabilitation) Valinda Party, MD as Consulting Physician (Rheumatology)  Indicate any recent [place] you may have received from other than [facility] providers in the past year (date may be approximate).     Assessment:   This is a routine wellness examination for [place]. Physical assessment deferred to PCP.  Hearing/Vision screen Hearing Screening Comments: Able to hear conversational tones w/o difficulty. No issues reported.   Vision Screening Comments: "Legally blind in left eye" Corneal transplant 2013 (right) Wears glasses.   Dietary issues and exercise activities discussed: Current Exercise Habits: The patient does not participate  in regular exercise at present (housework), Exercise limited by: respiratory conditions(s)   Diet (meal preparation, eat out, water intake, caffeinated beverages, dairy products, fruits and vegetables): Drinks Ensure, water, juice and coffee.   Breakfast: cereal Lunch: vegetables Dinner: soup; frozen dinner; protein and vegetable    Encouraged to increase calories, increase activity as tolerated.   Goals    . Increase physical activity          To be more active.       Depression Screen PHQ 2/9 Scores 10/12/2016 10/12/2016 09/19/2016 07/13/2016  PHQ - 2 Score 0 0 0 1  PHQ- 9 Score 2 - - 1    Fall Risk Fall Risk  10/12/2016 09/19/2016 07/13/2016  Falls in the past year? No No No    Cognitive Function: MMSE - Mini Mental State Exam 10/12/2016  Orientation to time 5  Orientation to Place 5  Registration 3  Attention/ Calculation 5  Recall 3  Language- name 2 objects 2  Language- repeat 1  Language- follow 3 step command 3  Language- read & follow direction 1  Write a sentence 1  Copy design 1  Total score 30        Screening  Tests Health Maintenance  Topic Date Due  . MAMMOGRAM  12/19/2016 (Originally 02/10/1995)  . DEXA SCAN  12/19/2016 (Originally 02/09/2010)  . Hepatitis C Screening  12/19/2016 (Originally 11/29/1945)  . PNA vac Low Risk Adult (1 of 2 - PCV13) 12/19/2016 (Originally 02/09/2010)  . INFLUENZA VACCINE  03/02/2020 (Originally 10/19/2016)  . COLONOSCOPY  09/29/2021  . TETANUS/TDAP  09/14/2023   Declines cancer screening.  Declines Vaccines.     Plan:    Continue doing brain stimulating activities (puzzles, reading, adult coloring books, staying active) to keep memory sharp.   Schedule Eye Exam  I have personally reviewed and noted the following in the patient's chart:   . Medical and social history . Use of alcohol, tobacco or illicit drugs  . Current medications and supplements . Functional ability and status . Nutritional status . Physical activity . Advanced directives . List of other physicians . Hospitalizations, surgeries, and ER visits in previous 12 months . Vitals . Screenings to include cognitive, depression, and falls . Referrals and appointments  In addition, I have reviewed and discussed with patient certain preventive protocols, quality metrics, and best practice recommendations. A written personalized care plan for preventive services as well as general preventive health recommendations were provided to patient.     Gerilyn Nestle, RN   10/12/2016   PCP Notes: -Declines cancer screening (mammo, colon) -Declines vaccines -Ready to quit smoking, declines Chantix Rx -Requested information regarding counseling. Counseling/Psych Services resource provided   Reviewed documentation provided above.  Documented pt's refusal of colonoscopy and mammo in Quality Metrics.  Will re-visit this at future visits.   Annye Asa, MD

## 2016-10-12 NOTE — Patient Instructions (Addendum)
Continue doing brain stimulating activities (puzzles, reading, adult coloring books, staying active) to keep memory sharp.   Schedule Eye Exam when you can.   Fall Prevention in the Home Falls can cause injuries. They can happen to people of all ages. There are many things you can do to make your home safe and to help prevent falls. What can I do on the outside of my home?  Regularly fix the edges of walkways and driveways and fix any cracks.  Remove anything that might make you trip as you walk through a door, such as a raised step or threshold.  Trim any bushes or trees on the path to your home.  Use bright outdoor lighting.  Clear any walking paths of anything that might make someone trip, such as rocks or tools.  Regularly check to see if handrails are loose or broken. Make sure that both sides of any steps have handrails.  Any raised decks and porches should have guardrails on the edges.  Have any leaves, snow, or ice cleared regularly.  Use sand or salt on walking paths during winter.  Clean up any spills in your garage right away. This includes oil or grease spills. What can I do in the bathroom?  Use night lights.  Install grab bars by the toilet and in the tub and shower. Do not use towel bars as grab bars.  Use non-skid mats or decals in the tub or shower.  If you need to sit down in the shower, use a plastic, non-slip stool.  Keep the floor dry. Clean up any water that spills on the floor as soon as it happens.  Remove soap buildup in the tub or shower regularly.  Attach bath mats securely with double-sided non-slip rug tape.  Do not have throw rugs and other things on the floor that can make you trip. What can I do in the bedroom?  Use night lights.  Make sure that you have a light by your bed that is easy to reach.  Do not use any sheets or blankets that are too big for your bed. They should not hang down onto the floor.  Have a firm chair that has  side arms. You can use this for support while you get dressed.  Do not have throw rugs and other things on the floor that can make you trip. What can I do in the kitchen?  Clean up any spills right away.  Avoid walking on wet floors.  Keep items that you use a lot in easy-to-reach places.  If you need to reach something above you, use a strong step stool that has a grab bar.  Keep electrical cords out of the way.  Do not use floor polish or wax that makes floors slippery. If you must use wax, use non-skid floor wax.  Do not have throw rugs and other things on the floor that can make you trip. What can I do with my stairs?  Do not leave any items on the stairs.  Make sure that there are handrails on both sides of the stairs and use them. Fix handrails that are broken or loose. Make sure that handrails are as long as the stairways.  Check any carpeting to make sure that it is firmly attached to the stairs. Fix any carpet that is loose or worn.  Avoid having throw rugs at the top or bottom of the stairs. If you do have throw rugs, attach them to the floor with  carpet tape.  Make sure that you have a light switch at the top of the stairs and the bottom of the stairs. If you do not have them, ask someone to add them for you. What else can I do to help prevent falls?  Wear shoes that: ? Do not have high heels. ? Have rubber bottoms. ? Are comfortable and fit you well. ? Are closed at the toe. Do not wear sandals.  If you use a stepladder: ? Make sure that it is fully opened. Do not climb a closed stepladder. ? Make sure that both sides of the stepladder are locked into place. ? Ask someone to hold it for you, if possible.  Clearly mark and make sure that you can see: ? Any grab bars or handrails. ? First and last steps. ? Where the edge of each step is.  Use tools that help you move around (mobility aids) if they are needed. These  include: ? Canes. ? Walkers. ? Scooters. ? Crutches.  Turn on the lights when you go into a dark area. Replace any light bulbs as soon as they burn out.  Set up your furniture so you have a clear path. Avoid moving your furniture around.  If any of your floors are uneven, fix them.  If there are any pets around you, be aware of where they are.  Review your medicines with your doctor. Some medicines can make you feel dizzy. This can increase your chance of falling. Ask your doctor what other things that you can do to help prevent falls. This information is not intended to replace advice given to you by your health care provider. Make sure you discuss any questions you have with your health care provider. Document Released: 01/01/2009 Document Revised: 08/13/2015 Document Reviewed: 04/11/2014 Elsevier Interactive Patient Education  2018 [place] Maintenance, Female Adopting a healthy lifestyle and getting preventive care can go a long way to promote health and wellness. Talk with your health care provider about what schedule of regular examinations is right for you. This is a good chance for you to check in with your provider about disease prevention and staying healthy. In between checkups, there are plenty of things you can do on your own. Experts have done a lot of research about which lifestyle changes and preventive measures are most likely to keep you healthy. Ask your health care provider for more information. Weight and diet Eat a healthy diet  Be sure to include plenty of vegetables, fruits, low-fat dairy products, and lean protein.  Do not eat a lot of foods high in solid fats, added sugars, or salt.  Get regular exercise. This is one of the most important things you can do for your health. ? Most adults should exercise for at least 150 minutes each week. The exercise should increase your heart rate and make you sweat (moderate-intensity exercise). ? Most adults  should also do strengthening exercises at least twice a week. This is in addition to the moderate-intensity exercise.  Maintain a healthy weight  Body mass index (BMI) is a measurement that can be used to identify possible weight problems. It estimates body fat based on height and weight. Your health care provider can help determine your BMI and help you achieve or maintain a healthy weight.  For females 84 years of age and older: ? A BMI below 18.5 is considered underweight. ? A BMI of 18.5 to 24.9 is normal. ? A BMI of 25 to  29.9 is considered overweight. ? A BMI of 30 and above is considered obese.  Watch levels of cholesterol and blood lipids  You should start having your blood tested for lipids and cholesterol at 72 years of age, then have this test every 5 years.  You may need to have your cholesterol levels checked more often if: ? Your lipid or cholesterol levels are high. ? You are older than 72 years of age. ? You are at high risk for heart disease.  Cancer screening Lung Cancer  Lung cancer screening is recommended for adults 39-15 years old who are at high risk for lung cancer because of a history of smoking.  A yearly low-dose CT scan of the lungs is recommended for people who: ? Currently smoke. ? Have quit within the past 15 years. ? Have at least a 30-pack-year history of smoking. A pack year is smoking an average of one pack of cigarettes a day for 1 year.  Yearly screening should continue until it has been 15 years since you quit.  Yearly screening should stop if you develop a health problem that would prevent you from having lung cancer treatment.  Breast Cancer  Practice breast self-awareness. This means understanding how your breasts normally appear and feel.  It also means doing regular breast self-exams. Let your health care provider know about any changes, no matter how small.  If you are in your 20s or 30s, you should have a clinical breast exam (CBE)  by a health care provider every 1-3 years as part of a regular health exam.  If you are 66 or older, have a CBE every year. Also consider having a breast X-ray (mammogram) every year.  If you have a family history of breast cancer, talk to your health care provider about genetic screening.  If you are at high risk for breast cancer, talk to your health care provider about having an MRI and a mammogram every year.  Breast cancer gene (BRCA) assessment is recommended for women who have family members with BRCA-related cancers. BRCA-related cancers include: ? Breast. ? Ovarian. ? Tubal. ? Peritoneal cancers.  Results of the assessment will determine the need for genetic counseling and BRCA1 and BRCA2 testing.  Cervical Cancer Your health care provider may recommend that you be screened regularly for cancer of the pelvic organs (ovaries, uterus, and vagina). This screening involves a pelvic examination, including checking for microscopic changes to the surface of your cervix (Pap test). You may be encouraged to have this screening done every 3 years, beginning at age 54.  For women ages 76-65, health care providers may recommend pelvic exams and Pap testing every 3 years, or they may recommend the Pap and pelvic exam, combined with testing for human papilloma virus (HPV), every 5 years. Some types of HPV increase your risk of cervical cancer. Testing for HPV may also be done on women of any age with unclear Pap test results.  Other health care providers may not recommend any screening for nonpregnant women who are considered low risk for pelvic cancer and who do not have symptoms. Ask your health care provider if a screening pelvic exam is right for you.  If you have had past treatment for cervical cancer or a condition that could lead to cancer, you need Pap tests and screening for cancer for at least 20 years after your treatment. If Pap tests have been discontinued, your risk factors (such as  having a new sexual partner) need to  be reassessed to determine if screening should resume. Some women have medical problems that increase the chance of getting cervical cancer. In these cases, your health care provider may recommend more frequent screening and Pap tests.  Colorectal Cancer  This type of cancer can be detected and often prevented.  Routine colorectal cancer screening usually begins at 72 years of age and continues through 72 years of age.  Your health care provider may recommend screening at an earlier age if you have risk factors for colon cancer.  Your health care provider may also recommend using home test kits to check for hidden blood in the stool.  A small camera at the end of a tube can be used to examine your colon directly (sigmoidoscopy or colonoscopy). This is done to check for the earliest forms of colorectal cancer.  Routine screening usually begins at age 20.  Direct examination of the colon should be repeated every 5-10 years through 72 years of age. However, you may need to be screened more often if early forms of precancerous polyps or small growths are found.  Skin Cancer  Check your skin from head to toe regularly.  Tell your health care provider about any new moles or changes in moles, especially if there is a change in a mole's shape or color.  Also tell your health care provider if you have a mole that is larger than the size of a pencil eraser.  Always use sunscreen. Apply sunscreen liberally and repeatedly throughout the day.  Protect yourself by wearing long sleeves, pants, a wide-brimmed hat, and sunglasses whenever you are outside.  Heart disease, diabetes, and high blood pressure  High blood pressure causes heart disease and increases the risk of stroke. High blood pressure is more likely to develop in: ? People who have blood pressure in the high end of the normal range (130-139/85-89 mm Hg). ? People who are overweight or  obese. ? People who are African American.  If you are 38-60 years of age, have your blood pressure checked every 3-5 years. If you are 18 years of age or older, have your blood pressure checked every year. You should have your blood pressure measured twice-once when you are at a hospital or clinic, and once when you are not at a hospital or clinic. Record the average of the two measurements. To check your blood pressure when you are not at a hospital or clinic, you can use: ? An automated blood pressure machine at a pharmacy. ? A home blood pressure monitor.  If you are between 63 years and 3 years old, ask your health care provider if you should take aspirin to prevent strokes.  Have regular diabetes screenings. This involves taking a blood sample to check your fasting blood sugar level. ? If you are at a normal weight and have a low risk for diabetes, have this test once every three years after 72 years of age. ? If you are overweight and have a high risk for diabetes, consider being tested at a younger age or more often. Preventing infection Hepatitis B  If you have a higher risk for hepatitis B, you should be screened for this virus. You are considered at high risk for hepatitis B if: ? You were born in a country where hepatitis B is common. Ask your health care provider which countries are considered high risk. ? Your parents were born in a high-risk country, and you have not been immunized against hepatitis B (hepatitis B  vaccine). ? You have HIV or AIDS. ? You use needles to inject street drugs. ? You live with someone who has hepatitis B. ? You have had sex with someone who has hepatitis B. ? You get hemodialysis treatment. ? You take certain medicines for conditions, including cancer, organ transplantation, and autoimmune conditions.  Hepatitis C  Blood testing is recommended for: ? Everyone born from [birth year] through 1966. ? Anyone with known risk factors for hepatitis  C.  Sexually transmitted infections (STIs)  You should be screened for sexually transmitted infections (STIs) including gonorrhea and chlamydia if: ? You are sexually active and are younger than 72 years of age. ? You are older than 72 years of age and your health care provider tells you that you are at risk for this type of infection. ? Your sexual activity has changed since you were last screened and you are at an increased risk for chlamydia or gonorrhea. Ask your health care provider if you are at risk.  If you do not have HIV, but are at risk, it may be recommended that you take a prescription medicine daily to prevent HIV infection. This is called pre-exposure prophylaxis (PrEP). You are considered at risk if: ? You are sexually active and do not regularly use condoms or know the HIV status of your partner(s). ? You take drugs by injection. ? You are sexually active with a partner who has HIV.  Talk with your health care provider about whether you are at high risk of being infected with HIV. If you choose to begin PrEP, you should first be tested for HIV. You should then be tested every 3 months for as long as you are taking PrEP. Pregnancy  If you are premenopausal and you may become pregnant, ask your health care provider about preconception counseling.  If you may become pregnant, take 400 to 800 micrograms (mcg) of folic acid every day.  If you want to prevent pregnancy, talk to your health care provider about birth control (contraception). Osteoporosis and menopause  Osteoporosis is a disease in which the bones lose minerals and strength with aging. This can result in serious bone fractures. Your risk for osteoporosis can be identified using a bone density scan.  If you are 77 years of age or older, or if you are at risk for osteoporosis and fractures, ask your health care provider if you should be screened.  Ask your health care provider whether you should take a calcium or  vitamin D supplement to lower your risk for osteoporosis.  Menopause may have certain physical symptoms and risks.  Hormone replacement therapy may reduce some of these symptoms and risks. Talk to your health care provider about whether hormone replacement therapy is right for you. Follow these instructions at home:  Schedule regular health, dental, and eye exams.  Stay current with your immunizations.  Do not use any tobacco products including cigarettes, chewing tobacco, or electronic cigarettes.  If you are pregnant, do not drink alcohol.  If you are breastfeeding, limit how much and how often you drink alcohol.  Limit alcohol intake to no more than 1 drink per day for nonpregnant women. One drink equals 12 ounces of beer, 5 ounces of wine, or 1 ounces of hard liquor.  Do not use street drugs.  Do not share needles.  Ask your health care provider for help if you need support or information about quitting drugs.  Tell your health care provider if you often feel depressed.  Tell your health care provider if you have ever been abused or do not feel safe at home. This information is not intended to replace advice given to you by your health care provider. Make sure you discuss any questions you have with your health care provider. Document Released: 09/20/2010 Document Revised: 08/13/2015 Document Reviewed: 12/09/2014 Elsevier Interactive Patient Education  2018 [employer].   Steps to Quit Smoking Smoking tobacco can be harmful to your health and can affect almost every organ in your body. Smoking puts you, and those around you, at risk for developing many serious chronic diseases. Quitting smoking is difficult, but it is one of the best things that you can do for your health. It is never too late to quit. What are the benefits of quitting smoking? When you quit smoking, you lower your risk of developing serious diseases and conditions, such as:  Lung cancer or lung disease,  such as COPD.  Heart disease.  Stroke.  Heart attack.  Infertility.  Osteoporosis and bone fractures.  Additionally, symptoms such as coughing, wheezing, and shortness of breath may get better when you quit. You may also find that you get sick less often because your body is stronger at fighting off colds and infections. If you are pregnant, quitting smoking can help to reduce your chances of having a baby of low birth weight. How do I get ready to quit? When you decide to quit smoking, create a plan to make sure that you are successful. Before you quit:  Pick a date to quit. Set a date within the next two weeks to give you time to prepare.  Write down the reasons why you are quitting. Keep this list in places where you will see it often, such as on your bathroom mirror or in your car or wallet.  Identify the people, places, things, and activities that make you want to smoke (triggers) and avoid them. Make sure to take these actions: ? Throw away all cigarettes at home, at work, and in your car. ? Throw away smoking accessories, such as Scientist, research (medical). ? Clean your car and make sure to empty the ashtray. ? Clean your home, including curtains and carpets.  Tell your family, friends, and coworkers that you are quitting. Support from your loved ones can make quitting easier.  Talk with your health care provider about your options for quitting smoking.  Find out what treatment options are covered by your health insurance.  What strategies can I use to quit smoking? Talk with your healthcare provider about different strategies to quit smoking. Some strategies include:  Quitting smoking altogether instead of gradually lessening how much you smoke over a period of time. Research shows that quitting "cold [place]" is more successful than gradually quitting.  Attending in-person counseling to help you build problem-solving skills. You are more likely to have success in quitting if  you attend several counseling sessions. Even short sessions of 10 minutes can be effective.  Finding resources and support systems that can help you to quit smoking and remain smoke-free after you quit. These resources are most helpful when you use them often. They can include: ? Online chats with a Social worker. ? Telephone quitlines. ? Careers information officer. ? Support groups or group counseling. ? Text messaging programs. ? Mobile phone applications.  Taking medicines to help you quit smoking. (If you are pregnant or breastfeeding, talk with your health care provider first.) Some medicines contain nicotine and some do not. Both types  of medicines help with cravings, but the medicines that include nicotine help to relieve withdrawal symptoms. Your health care provider may recommend: ? Nicotine patches, gum, or lozenges. ? Nicotine inhalers or sprays. ? Non-nicotine medicine that is taken by mouth.  Talk with your health care provider about combining strategies, such as taking medicines while you are also receiving in-person counseling. Using these two strategies together makes you more likely to succeed in quitting than if you used either strategy on its own. If you are pregnant or breastfeeding, talk with your health care provider about finding counseling or other support strategies to quit smoking. Do not take medicine to help you quit smoking unless told to do so by your health care provider. What things can I do to make it easier to quit? Quitting smoking might feel overwhelming at first, but there is a lot that you can do to make it easier. Take these important actions:  Reach out to your family and friends and ask that they support and encourage you during this time. Call telephone quitlines, reach out to support groups, or work with a counselor for support.  Ask people who smoke to avoid smoking around you.  Avoid places that trigger you to smoke, such as bars, parties, or  smoke-break areas at work.  Spend time around people who do not smoke.  Lessen stress in your life, because stress can be a smoking trigger for some people. To lessen stress, try: ? Exercising regularly. ? Deep-breathing exercises. ? Yoga. ? Meditating. ? Performing a body scan. This involves closing your eyes, scanning your body from head to toe, and noticing which parts of your body are particularly tense. Purposefully relax the muscles in those areas.  Download or purchase mobile phone or tablet apps (applications) that can help you stick to your quit plan by providing reminders, tips, and encouragement. There are many free apps, such as QuitGuide from the [employer] Office manager for Disease Control and Prevention). You can find other support for quitting smoking (smoking cessation) through smokefree.gov and other websites.  How will I feel when I quit smoking? Within the first 24 hours of quitting smoking, you may start to feel some withdrawal symptoms. These symptoms are usually most noticeable 2-3 days after quitting, but they usually do not last beyond 2-3 weeks. Changes or symptoms that you might experience include:  Mood swings.  Restlessness, anxiety, or irritation.  Difficulty concentrating.  Dizziness.  Strong cravings for sugary foods in addition to nicotine.  Mild weight gain.  Constipation.  Nausea.  Coughing or a sore throat.  Changes in how your medicines work in your body.  A depressed mood.  Difficulty sleeping (insomnia).  After the first 2-3 weeks of quitting, you may start to notice more positive results, such as:  Improved sense of smell and taste.  Decreased coughing and sore throat.  Slower heart rate.  Lower blood pressure.  Clearer skin.  The ability to breathe more easily.  Fewer sick days.  Quitting smoking is very challenging for most people. Do not get discouraged if you are not successful the first time. Some people need to make many  attempts to quit before they achieve long-term success. Do your best to stick to your quit plan, and talk with your health care provider if you have any questions or concerns. This information is not intended to replace advice given to you by your health care provider. Make sure you discuss any questions you have with your health care provider.  Document Released: 03/01/2001 Document Revised: 11/03/2015 Document Reviewed: 07/22/2014 Elsevier Interactive Patient Education  2017 [employer].

## 2016-10-13 NOTE — Progress Notes (Signed)
[place]  Telephone:([phone] Fax:([phone]  Clinic follow Up Note   Patient Care Team: Tina Minium, MD as PCP - General (Family Medicine) Tina Bailey Tina Oto, MD as Consulting Physician (Vascular Surgery) Tina Klein, MD as Consulting Physician (General Surgery) Tina Merle, MD as Consulting Physician (Hematology) Tina Broad, MD as Consulting Physician (Physical Medicine and Rehabilitation) Tina Party, MD as Consulting Physician (Rheumatology) 10/19/2016   CHIEF COMPLAINTS:  Hepatocellular carcinoma  Oncology History   Hepatocellular carcinoma [facility])   Staging form: Liver (Excluding Intrahepatic Bile Ducts), AJCC 7th Edition     Clinical stage from 12/30/2014: Stage Unknown (T1, NX, M0) - Unsigned       Hepatocellular carcinoma ([place])   12/02/2014 Imaging    Liver MRI showed a large lesion in segment 5 and 40, highly suspicious for malignancy. And a 1.2 cm indeterminate pancreatic body lesion. Mildly enlarged portocaval lymph node. Cholelithiasis.      12/19/2014 Initial Diagnosis    Hepatocellular carcinoma ([place])      12/19/2014 Initial Biopsy    Liver mass biopsy showed parathyroid carcinoma, positive for hep par 1, cytokeratin 8/18, cytokeratin 7 and MOC-31.      12/25/2014 Imaging    CT chest showed a 3 mm groundglass probably nodule in the right upper lobe, mild right pericardial phrenic lymphadenopathy, no definitive evidence of metastasis.      01/13/2015 Procedure    Liquid embolization of the right portal venous system by Interventional Radiologist Dr. Laurence Bailey      03/04/2015 Surgery    Liver right lobe hepatectomy, and the portal lymph nodes biopsy      03/04/2015 Pathology Results    Hepatocellular carcinoma, poorly differentiated, spanning 8.5 cm, surgical margins were negative, lymphovascular invasion is identified, for portal lymph nodes were negative.       02/29/2016 Imaging    CT chest w contrast  02/29/2016 IMPRESSION: 1. Status post partial right hepatectomy, without recurrent or metastatic disease. 2. Removal of common duct stent since 06/10/2015. Upper normal to mildly dilated common duct after cholecystectomy. Recommend attention on follow-up. 3. Similar pancreatic head lesion, as detailed on prior MRI. 4. Pelvic floor laxity with cystocele. 5.  Aortic atherosclerosis.      10/14/2016 Imaging    MRI Abdomen W WO Contrast 10/14/16 IMPRESSION: 1. Stable appearance of the liver status post right hepatectomy. No evidence of recurrent tumor or metastatic disease. 2. Suggested slight interval enlargement of known cystic pancreatic mass, likely communicating with the main pancreatic duct and probably reflecting an intraductal papillary mucinous neoplasm. This demonstrates no aggressive characteristics, and continued surveillance suggested.       HISTORY OF PRESENTING ILLNESS:  Tina Bailey 71 y.o. female is here because of recently discovered liver mass.  She has had abdominal pain after meals for the past 4 years, occurs once every several months, usually resolves after 20-30 mins on its own. She was seen by her PCP, had [place] and her symptoms was felt to be related to her gall bladder stone.   She has had some pressure feeling under right ribcage, which radiates to back, for the past 8-9 months, and slight decrease her appetite, and fatigue, she lost about 10 lbs.   She went to see Dr. Oneida Bailey for left 5th toes color change (blue), no claudication, she underwent CT angiogram which incidentally showed a large liver mass and a 1.2 cm pancreatic cystic lesion. She was referred to [place] for further management.   She never  had colonoscopy. She denies melena, and she is here, change of her bowel habit. Se lives with her daughter. She has extensive smoking history, no family history of malignancy.  CURRENT THERAPY: Surveillance   INTERIM HISTORY  Tina Bailey returns for follow-up. She  presents to the clinic today reporting to an abscess in her mouth and she is going to dentist. She was given clindamycin. She needs surgery to remove tooth.  She still has a sensation and pain in her lower left abdomen and upper right abdomen. She says she has twinges on her left abdominal side. When she stands her stomach pulls in. She knows her breathing is harder. She has not quit yet. She does not have a inhaler anymore. She does have PCP.   MEDICAL HISTORY:  Past Medical History:  Diagnosis Date  . Arthritis    DDD. Right shoulder"is frozen"-limited ROM. osteoporosis.  . Blue toe syndrome ([place]) 11/27/2014   Dr. Claudia Bailey evaluating  . Cancer ([place]) 12/2014   liver cancer  . Complication of anesthesia   . COPD (chronic obstructive pulmonary disease) ([place])   . Fibromyalgia   . Fracture of rib of right side    hx "osteoporosis"- states her dog nudge her on the side, next day developed great pain and was told has a fracture rib.  . Macular degeneration    R eye  . Osteoporosis   . PONV (postoperative nausea and vomiting)    nausea,severe vomiting after 01-13-15 portal vein embolization  . Productive cough   . Retina disorder    L eye, vision distorted, edema  . Spine fracture due to birth trauma   . TMJ disease   . Wears glasses     SURGICAL HISTORY: Past Surgical History:  Procedure Laterality Date  . ANGIOPLASTY  2008   no stents required, no follow-up with cardiologist, no recurrent chest pain  . CATARACT EXTRACTION, BILATERAL Bilateral   . ESOPHAGOGASTRODUODENOSCOPY (EGD) WITH PROPOFOL N/A 06/25/2015   Procedure: ESOPHAGOGASTRODUODENOSCOPY (EGD) WITH PROPOFOL;  Surgeon: Tina Banister, MD;  Location: [facility] END[facility]COPY;  Service: Endoscopy;  Laterality: N/A;  stent removal   . EYE SURGERY     cornea surgery  . KIDNEY STONE SURGERY    . LAPAROSCOPIC PARTIAL HEPATECTOMY N/A 03/04/2015   Procedure: DIAGNOSTIC LAPAROSCOPY, EXTENDED RIGHT HEPATECTOMY, WITH INTRAOPERATIVE ULTRASOUND;   Surgeon: Tina Klein, MD;  Location: [facility] ORS;  Service: General;  Laterality: N/A;  . PARTIAL HYSTERECTOMY    . portal vein embolization     01-13-15 -Tina Bailey.    SOCIAL HISTORY: Social History   Social History  . Marital Status: Divorced    Spouse Name: N/A  . Number of Children: 3   . Years of Education: N/A   Occupational History  . 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 Smoking status: Current Every Day Smoker -- 1.00 packs/day for 45 years    Types: Cigarettes  . Smokeless tobacco: Never Used  . Alcohol Use: No  . Drug Use: No  . Sexual Activity: Not on file   Other Topics Concern  . Not on file   Social History Narrative    FAMILY HISTORY: Family History  Problem Relation Age of Onset  . Hypertension Mother   . Atrial fibrillation Mother   . Heart disease Mother        after age 57  . Stroke Father   . Dementia Father   . Heart disease Brother  After age 69- A-Fib  . Heart attack Brother     ALLERGIES:  is allergic to amoxicillin-pot clavulanate; paroxetine hcl; and sulfa antibiotics.  MEDICATIONS:  Current Outpatient Prescriptions  Medication Sig Dispense Refill  . AMINO ACIDS COMPLEX PO Take by mouth. Powder -  Takes  3 times  Per  Week.    [employer].    Marland Kitchen aspirin-acetaminophen-caffeine (EXCEDRIN MIGRAINE) 250-250-65 MG tablet Take by mouth every 6 (six) hours as needed for headache.    . citalopram (CELEXA) 10 MG tablet Take 1 tablet (10 mg total) by mouth daily. 30 tablet 3  . clindamycin (CLEOCIN) 300 MG capsule Take 300 mg by mouth every 6 (six) hours. Take for 7 days    . Cod Liver Oil OIL Take by mouth.    . Cyanocobalamin (VITAMIN B-12) 1000 MCG SUBL Place under the tongue.    Marland Kitchen ENSURE (ENSURE) Take 237 mLs by mouth.    Marland Kitchen HYDROcodone-acetaminophen (NORCO/VICODIN) 5-325 MG tablet Take 1 tablet by mouth as needed.    . Multiple Vitamin (MULTIVITAMIN WITH MINERALS) TABS tablet  Take 1 tablet by mouth daily.    Marland Kitchen VITAMIN D, CHOLECALCIFEROL, PO Take 5,000 Units by mouth daily.    . Calcium Carbonate-Vitamin D (CALCIUM 500+D HIGH POTENCY PO) Take by mouth.     No current facility-administered medications for this visit.     REVIEW OF SYSTEMS:  Constitutional: Denies fevers, chills or abnormal night sweats, (+) fatigue Eyes: Denies blurriness of vision, double vision or watery eyes Ears, nose, mouth, throat, and face: Denies mucositis or sore throat Respiratory: Denies cough  (+) SOB, wheezing Cardiovascular: Denies palpitation, chest discomfort or lower extremity swelling Gastrointestinal:  Denies nausea, heartburn (+) diarrhea (+) RUQ pain Skin: Denies abnormal skin rashes Lymphatics: Denies new lymphadenopathy or easy bruising Neurological:Denies numbness, tingling or new weaknesses, (+) mid back pain  Behavioral/Psych: Mood is stable, no new changes  All other systems were reviewed with the patient and are negative.  PHYSICAL EXAMINATION: ECOG PERFORMANCE STATUS: 2  Vitals:   10/19/16 1143  BP: (!) 170/60  Pulse: 71  Resp: 16  Temp: 98.3 F (36.8 C)   Filed Weights   10/19/16 1143  Weight: 96 lb 6.4 oz (43.7 kg)     GENERAL:alert, no distress and comfortable, she wear a thoracic spine brace  SKIN: skin color, texture, turgor are normal, no rashes or significant lesions EYES: normal, conjunctiva are pink and non-injected, sclera clear OROPHARYNX:no exudate, no erythema and lips, buccal mucosa, and tongue normal  NECK: supple, thyroid normal size, non-tender, without nodularity LYMPH:  no palpable lymphadenopathy in the cervical, axillary or inguinal LUNGS: (+) bilateral wheezing, no rales. HEART: regular rate & rhythm and no murmurs and no lower extremity edema ABDOMEN:abdomen soft, normal bowel sounds, no organomegaly (+) soft, no hepatomegaly Musculoskeletal:no cyanosis of digits and no clubbing, no tender spot. PSYCH: alert & oriented x 3  with fluent speech NEURO: no focal motor/sensory deficits  LABORATORY DATA:  I have reviewed the data as listed CBC Latest Ref Rng & Units 10/19/2016 08/25/2016 07/15/2016  WBC 3.9 - 10.3 10e3/uL 6.1 5.3 7.7  Hemoglobin 11.6 - 15.9 g/dL 15.0 14.9 14.4  Hematocrit 34.8 - 46.6 % 45.2 44.4 43.6  Platelets 145 - 400 10e3/uL 193 196.0 184    CMP Latest Ref Rng & Units 10/19/2016 10/14/2016 08/25/2016  Glucose 70 - 140 mg/dl 98 - 106(H)  BUN 7.0 - 26.0 mg/dL 11.6 - 13  Creatinine 0.6 - 1.1  mg/dL 0.7 0.50 0.56  Sodium 136 - 145 mEq/L 133(L) - 135  Potassium 3.5 - 5.1 mEq/L 4.6 - 4.8  Chloride 96 - 112 mEq/L - - 99  CO2 22 - 29 mEq/L 33(H) - 29  Calcium 8.4 - 10.4 mg/dL 9.8 - 9.5  Total Protein 6.4 - 8.3 g/dL 6.9 - 6.4  Total Bilirubin 0.20 - 1.20 mg/dL 0.61 - 0.6  Alkaline Phos 40 - 150 U/L 79 - 59  AST 5 - 34 U/L 24 - 18  ALT 0 - 55 U/L 23 - 14     AFP Results for JLA, REYNOLDS (MRN [id number]) as of 10/13/2016 17:16  Ref. Range 12/07/2015 12:23 02/29/2016 11:25 07/15/2016 14:09  AFP Tumor Marker Latest Ref Range: <6.1 ng/mL 3.4    AFP, Serum, Tumor Marker Latest Ref Range: 0.0 - 8.3 ng/mL 4.7 3.0 3.6  10/19/16: PENDING   PATHOLOGY REPORT  Diagnosis 03/04/2015 1. Liver, biopsy, left sided nodule - BENIGN HEPATIC PARENCHYMA. - THERE IS NO EVIDENCE OF MALIGNANCY. 2. Liver, biopsy, posterior sided nodule - BENIGN HEPATIC PARENCHYMA WITH ASSOCIATED FIBROUS TISSUE. - THERE IS NO EVIDENCE OF MALIGNANCY. 3. Lymph node, biopsy, portal - THERE IS NO EVIDENCE OF CARCINOMA IN 3 OF 3 LYMPH NODES (0/3). 4. Lymph node, biopsy, posterior portal - THERE IS NO EVIDENCE OF CARCINOMA IN 1 OF 1 LYMPH NODE (0/1). 5. Liver, hepatectomy, right lobe - HEPATOCELLULAR CARCINOMA, POORLY DIFFERENTIATED, SPANNING 8.5 CM. - THE SURGICAL RESECTION MARGINS ARE NEGATIVE FOR CARCINOMA. - LYMPHOVASCULAR INVASION IS IDENTIFIED. - SEE ONCOLOGY TABLE BELOW. OTHER FINDINGS: - BENIGN HEPATIC PARENCHYMA WITH MILD  PORTAL CHRONIC INFLAMMATION AND DILATED VASCULATURE. - GALLBLADDER WITH MILD CHRONIC CHOLECYSTITIS AND CHOLELITHIASIS.  RADIOGRAPHIC STUDIES: I have personally reviewed the radiological images as listed and agreed with the findings in the report.  MRI Abdomen W WO Contrast 10/14/16 IMPRESSION: 1. Stable appearance of the liver status post right hepatectomy. No evidence of recurrent tumor or metastatic disease. 2. Suggested slight interval enlargement of known cystic pancreatic mass, likely communicating with the main pancreatic duct and probably reflecting an intraductal papillary mucinous neoplasm. This demonstrates no aggressive characteristics, and continued surveillance suggested.   CT chest w contrast 02/29/2016 IMPRESSION: 1. Status post partial right hepatectomy, without recurrent or metastatic disease. 2. Removal of common duct stent since 06/10/2015. Upper normal to mildly dilated common duct after cholecystectomy. Recommend attention on follow-up. 3. Similar pancreatic head lesion, as detailed on prior MRI. 4. Pelvic floor laxity with cystocele. 5.  Aortic atherosclerosis.  MRI abdomen w wo contrast 10/27/2015 IMPRESSION: Status post right hepatectomy. No findings suspicious for recurrent HCC. Stable 1.3 cm cystic lesion in the pancreatic body, possibly reflecting a pseudocyst or IPMN. Mild-to-moderate T7 and T11 compression fracture deformities, likely progressed.  Mr Liver W Wo Contrast 12/02/2014   IMPRESSION: 1. The first lesion of concern is a fairly vascular mass spanning segments 5 and 4B of the liver with capsule or pseudocapsule appearance, and central enhancement which spreads peripherally on the later images, and also with associated hepatic capsular retraction. Differential diagnostic considerations for this lesion include peripheral cholangiocarcinoma ; hepatic epitheliod hemangioendothelioma ; hepatocellular carcinoma ; or treated metastatic disease. Biopsy  is likely warranted. 2. The second lesion of concern is a 1.2 cm pancreatic body lesion with high T2 and low T1 signal characteristics, questionable attachment to the dorsal pancreatic duct, and some internal septation and probably some faint nodular enhancement internally. The appearance on CT, for example image 62 series 9, is  somewhat more concerning for internal enhancement, although measurable enhancement is present on MRI. This could be a small macro cystic tumor with solid component; the primary ductal pancreatic adenocarcinoma with cystic degeneration; were cystic degeneration of an islet cell tumor such as insulinoma or glucagon adenoma. Metastatic disease to the pancreas could possibly appear this way. Intraductal papillary mucinous neoplasm with small solid component could also appear this way. Biopsy, surveillance, or further imaging characterization with nuclear medicine PET-CT may be warranted. 3. Cholelithiasis. 4. Mild biliary dilatation. 5. Mildly enlarged portacaval lymph node. In the setting of the hepatic and pancreatic lesions the appearance raises concern for the possibility of early local metastatic disease. This could also be further characterized at PET-CT.   Electronically Signed   By: Van Clines M.D.   On: 12/02/2014 08:39     ASSESSMENT & PLAN: 71 y.o. African-American female, with 45-pack-year smoking history, presented with incidental scan finding of a large peripheral liver lesion in the right lobe segmental 5 and a 4B, and a small pancreatic cystic lesion.  1. Hepatocellular carcinoma, pT3bN0M0, stage IIIB, poorly differentiated  -I reviewed her surgical pathology findings with her in details. Her surgical margins were negative, she had a complete resection -No role for adjuvant therapy.  -We discussed the risk of cancer recurrence. Giving the locally advanced stage, she is certainly at high risk for recurrence, especially local recurrence. -I previously reviewed her  surveillance CT scan from 02/29/16, which showed no evidence of recurrence - continue cancer surveillance, she will be seen every 4-6 months with lab including AFP, and repeated surveillance CT scan every 6-12 months, for total 5 years. -I reviewed her restaging abdominal MRI from 10/14/2016, which showed no evidence of recurrence. -She is clinically doing well, physical exam was unremarkable, except wheezing, lab reviewed, her recent liver function and AFP are within normal limits. No clinical concern for recurrence. -She has been cancer free for 2 years now.   -f/u in 6 months --repeat MRI in 12 months if clinically stable   2. Pancreatic lesion -Abdominal MRI showed a 1.2 cm pancreatic body lesion, likely cystic with some solid component.  -Her imaging was reviewed in our tumor before, radiologist feels they pancreatic lesion is less likely malignancy. -abdomen MRI in 10/2015 showed stable pancreatic cyst, likely benign -We'll follow up clinically -Mri from 10/14/16 shows stable pancreatic cysts  3. COPD and smoking cessation  -She will continue to follow-up with her primary care physician -I discussed the smoking cessation extensively. She is willing to cut back, not ready for quit completely yet.  - I suggest she follows up with her PCP about her COPD managment.   4. T11 compression fracture -she will continue follow up with her orthopedic surgeon -her mid back pain has improved overall.  5. Osteoporosis -The patient's last DEXA scan was performed by her [facility] -She was prescribed FORTEO in September 2017, but stopped in March 2018 because she did not want to do injections anymore. -She is taking a multivitamin and Vitamin D. -She is scheduled to have a repeated DEXA scan next month  Plan -Lab and skin reviewed, no evidence of recurrence  -lab and f/u in 6 months  -Repeat MRI in 12 months  All questions were answered. The patient knows to call the clinic with any problems,  questions or concerns. I spent 20 minutes counseling the patient face to face. The total time spent in the appointment was 25 minutes and more than 50% was on counseling.  Tina Merle, MD 10/19/2016   This document serves as a record of services personally performed by Tina Merle, MD. It was created on her behalf by Joslyn Devon, a trained medical scribe. The creation of this record is based on the scribe's personal observations and the provider's statements to them. This document has been checked and approved by the attending provider.

## 2016-10-14 ENCOUNTER — Ambulatory Visit (HOSPITAL_COMMUNITY)
Admission: RE | Admit: 2016-10-14 | Discharge: 2016-10-14 | Disposition: A | Discharge status: Home / Self Care | Payer: PPO | Source: Ambulatory Visit | Attending: Hematology | Admitting: Hematology

## 2016-10-14 DIAGNOSIS — Z8505 Personal history of malignant neoplasm of liver: Secondary | ICD-10-CM | POA: Insufficient documentation

## 2016-10-14 DIAGNOSIS — Z9089 Acquired absence of other organs: Secondary | ICD-10-CM | POA: Insufficient documentation

## 2016-10-14 DIAGNOSIS — C220 Liver cell carcinoma: Secondary | ICD-10-CM | POA: Diagnosis present

## 2016-10-14 DIAGNOSIS — K862 Cyst of pancreas: Secondary | ICD-10-CM | POA: Insufficient documentation

## 2016-10-14 LAB — POCT I-STAT CREATININE: Creatinine, Ser: 0.5 mg/dL (ref 0.44–1.00)

## 2016-10-14 MED ORDER — GADOXETATE DISODIUM 0.25 MMOL/ML IV SOLN
10.0000 mL | Freq: Once | INTRAVENOUS | Status: AC | PRN
  Administered 2016-10-14: 5 mL via INTRAVENOUS

## 2016-10-19 ENCOUNTER — Ambulatory Visit (HOSPITAL_BASED_OUTPATIENT_CLINIC_OR_DEPARTMENT_OTHER): Payer: PPO | Admitting: Hematology

## 2016-10-19 ENCOUNTER — Encounter: Payer: Self-pay | Admitting: Hematology

## 2016-10-19 ENCOUNTER — Telehealth: Payer: Self-pay | Admitting: Hematology

## 2016-10-19 ENCOUNTER — Other Ambulatory Visit (HOSPITAL_BASED_OUTPATIENT_CLINIC_OR_DEPARTMENT_OTHER): Payer: PPO

## 2016-10-19 VITALS — BP 170/60 | HR 71 | Temp 98.3°F | Resp 16 | Ht 66.0 in | Wt 96.4 lb

## 2016-10-19 DIAGNOSIS — C220 Liver cell carcinoma: Secondary | ICD-10-CM | POA: Diagnosis not present

## 2016-10-19 DIAGNOSIS — M810 Age-related osteoporosis without current pathological fracture: Secondary | ICD-10-CM

## 2016-10-19 DIAGNOSIS — M8000XG Age-related osteoporosis with current pathological fracture, unspecified site, subsequent encounter for fracture with delayed healing: Secondary | ICD-10-CM

## 2016-10-19 DIAGNOSIS — K8689 Other specified diseases of pancreas: Secondary | ICD-10-CM

## 2016-10-19 LAB — CBC WITH DIFFERENTIAL/PLATELET
BASO%: 0.8 % (ref 0.0–2.0)
Basophils Absolute: 0.1 10*3/uL (ref 0.0–0.1)
EOS%: 1.2 % (ref 0.0–7.0)
Eosinophils Absolute: 0.1 10*3/uL (ref 0.0–0.5)
HCT: 45.2 % (ref 34.8–46.6)
HGB: 15 g/dL (ref 11.6–15.9)
LYMPH%: 29.5 % (ref 14.0–49.7)
MCH: 30.8 pg (ref 25.1–34.0)
MCHC: 33.3 g/dL (ref 31.5–36.0)
MCV: 92.7 fL (ref 79.5–101.0)
MONO#: 0.5 10*3/uL (ref 0.1–0.9)
MONO%: 8.5 % (ref 0.0–14.0)
NEUT#: 3.7 10*3/uL (ref 1.5–6.5)
NEUT%: 60 % (ref 38.4–76.8)
Platelets: 193 10*3/uL (ref 145–400)
RBC: 4.87 10*6/uL (ref 3.70–5.45)
RDW: 13.4 % (ref 11.2–14.5)
WBC: 6.1 10*3/uL (ref 3.9–10.3)
lymph#: 1.8 10*3/uL (ref 0.9–3.3)

## 2016-10-19 LAB — COMPREHENSIVE METABOLIC PANEL
ALT: 23 U/L (ref 0–55)
AST: 24 U/L (ref 5–34)
Albumin: 4 g/dL (ref 3.5–5.0)
Alkaline Phosphatase: 79 U/L (ref 40–150)
Anion Gap: 6 mEq/L (ref 3–11)
BUN: 11.6 mg/dL (ref 7.0–26.0)
CO2: 33 mEq/L — ABNORMAL HIGH (ref 22–29)
Calcium: 9.8 mg/dL (ref 8.4–10.4)
Chloride: 94 mEq/L — ABNORMAL LOW (ref 98–109)
Creatinine: 0.7 mg/dL (ref 0.6–1.1)
EGFR: 84 mL/min/{1.73_m2} — ABNORMAL LOW (ref >90)
Glucose: 98 mg/dl (ref 70–140)
Potassium: 4.6 mEq/L (ref 3.5–5.1)
Sodium: 133 mEq/L — ABNORMAL LOW (ref 136–145)
Total Bilirubin: 0.61 mg/dL (ref 0.20–1.20)
Total Protein: 6.9 g/dL (ref 6.4–8.3)

## 2016-10-19 NOTE — Telephone Encounter (Signed)
Gave patient avs report and appointments January

## 2016-10-20 LAB — AFP TUMOR MARKER: AFP, Serum, Tumor Marker: 3.2 ng/mL (ref 0.0–8.3)

## 2016-10-28 ENCOUNTER — Encounter: Payer: Self-pay | Admitting: Family Medicine

## 2016-10-28 ENCOUNTER — Ambulatory Visit (INDEPENDENT_AMBULATORY_CARE_PROVIDER_SITE_OTHER): Payer: PPO | Admitting: Family Medicine

## 2016-10-28 VITALS — BP 130/76 | HR 80 | Temp 98.0°F | Resp 17 | Ht 66.0 in | Wt 96.2 lb

## 2016-10-28 DIAGNOSIS — F418 Other specified anxiety disorders: Secondary | ICD-10-CM | POA: Diagnosis not present

## 2016-10-28 DIAGNOSIS — F17210 Nicotine dependence, cigarettes, uncomplicated: Secondary | ICD-10-CM | POA: Diagnosis not present

## 2016-10-28 NOTE — Progress Notes (Signed)
Pre visit review using our clinic review tool, if applicable. No additional management support is needed unless otherwise documented below in the visit note. 

## 2016-10-28 NOTE — Patient Instructions (Signed)
Follow up as scheduled in October for your physical Continue the Citalopram daily to help w/ anxiety- you're doing great! Keep up the good work on quitting smoking!  I'm SO proud of you!!! Call with any questions or concerns Enjoy the rest of your summer!!!

## 2016-10-28 NOTE — Assessment & Plan Note (Signed)
Improved since starting Citalopram.  Pt reports her religion is also helping to ground her.  Will need to watch closely to make sure that she is not having religious delusions as she seems to swing between depression and mania.  She likely needs geri psych but she admits to being very distrustful of doctors and is not willing to go.  No med changes at this time.  Will follow.

## 2016-10-28 NOTE — Assessment & Plan Note (Signed)
Pt has been smoke free x6 days w/ the help of the patch.  Applauded her efforts.  Will follow.

## 2016-10-28 NOTE — Progress Notes (Signed)
   Subjective:    Patient ID: Tina Bailey, female    DOB: June 28, 1945, 71 y.o.   MRN: [id number]  HPI Anxiety- 'it's so unusual for me to feel calm.  I've spent my whole life running like a race horse'.  Pt started Celexa 10mg  daily after last visit.  She feels her 'spiritual relationship w/ the lord' has also changed things for her.  She has decided to quit smoking- has been using the patch for 6 days.  'it's kind of like a calm, a peace'.  Pt is taking the Citalopram at 10pm nightly.  Pt was able to go 12 hrs between hydrocodone.   Review of Systems For ROS see HPI     Objective:   Physical Exam  Constitutional: She is oriented to person, place, and time. She appears well-developed. No distress.  Thin, elderly woman  Neurological: She is alert and oriented to person, place, and time.  Skin: Skin is warm and dry.  Psychiatric:  Pt's thought process is easier to follow today but will still have rapid speech and bursts of ideas  Vitals reviewed.         Assessment & Plan:

## 2016-11-07 DIAGNOSIS — M353 Polymyalgia rheumatica: Secondary | ICD-10-CM | POA: Diagnosis not present

## 2016-11-07 DIAGNOSIS — M797 Fibromyalgia: Secondary | ICD-10-CM | POA: Diagnosis not present

## 2016-11-07 DIAGNOSIS — M810 Age-related osteoporosis without current pathological fracture: Secondary | ICD-10-CM | POA: Diagnosis not present

## 2016-11-16 ENCOUNTER — Telehealth: Payer: Self-pay | Admitting: *Deleted

## 2016-11-16 NOTE — Telephone Encounter (Signed)
Try decreasing to 1/2 tab nightly and see if this helps

## 2016-11-16 NOTE — Telephone Encounter (Signed)
Patient states that she is still having issues with Citalopram.  She states that about 30 minutes after she takes it, her legs feel like they are tingling/shaking. She states that they feel like this to the point that her legs hurt and then she gets extremely cold.   This happens every night after she takes it.   She is asking if she needs to just stop this medication.   Routed to provider to advise.

## 2016-11-16 NOTE — Telephone Encounter (Signed)
Left detailed message for patient instructing her to take 1/2 tablet nightly. Informed patient to let us know how she is doing on this.

## 2016-11-22 DIAGNOSIS — M797 Fibromyalgia: Secondary | ICD-10-CM | POA: Diagnosis not present

## 2016-11-22 DIAGNOSIS — M810 Age-related osteoporosis without current pathological fracture: Secondary | ICD-10-CM | POA: Diagnosis not present

## 2016-11-22 DIAGNOSIS — M353 Polymyalgia rheumatica: Secondary | ICD-10-CM | POA: Diagnosis not present

## 2016-12-16 ENCOUNTER — Ambulatory Visit (INDEPENDENT_AMBULATORY_CARE_PROVIDER_SITE_OTHER): Payer: PPO | Admitting: Family Medicine

## 2016-12-16 ENCOUNTER — Ambulatory Visit (INDEPENDENT_AMBULATORY_CARE_PROVIDER_SITE_OTHER): Payer: PPO

## 2016-12-16 ENCOUNTER — Encounter: Payer: Self-pay | Admitting: General Practice

## 2016-12-16 ENCOUNTER — Encounter: Payer: Self-pay | Admitting: Family Medicine

## 2016-12-16 VITALS — BP 123/68 | HR 83 | Temp 98.9°F | Resp 17 | Ht 64.0 in | Wt 103.0 lb

## 2016-12-16 DIAGNOSIS — M8000XG Age-related osteoporosis with current pathological fracture, unspecified site, subsequent encounter for fracture with delayed healing: Secondary | ICD-10-CM

## 2016-12-16 DIAGNOSIS — M545 Low back pain, unspecified: Secondary | ICD-10-CM

## 2016-12-16 DIAGNOSIS — M47816 Spondylosis without myelopathy or radiculopathy, lumbar region: Secondary | ICD-10-CM | POA: Diagnosis not present

## 2016-12-16 NOTE — Patient Instructions (Signed)
Please go to [address] (on the L hand side of [place]) to get your Tina Bailey notify you of the results and make any changes if needed Continue the hydrocodone and the ibuprofen for pain relief Alternate ice and heat! Call with any questions or concerns Hang in there!!!

## 2016-12-16 NOTE — Progress Notes (Signed)
   Subjective:    Patient ID: Tina Bailey, female    DOB: September 17, 1945, 71 y.o.   MRN: [id number]  HPI Back pain- 'i think it's a fracture but I hope it's not'.  Pt has hx of vertebral fxs and 'this is how it is with me'.  Pain started Wed afternoon when she leaned over to pick up her guitar.  Felt a 'twinge' and then had pain w/ certain movements and w/ cough.  Pt takes hydrocodone on a regular basis and this pain breaks through that.  Some improvement w/ ibuprofen.  Pt's pain is lumbar- radiates through abdomen- 'through and through'.   Review of Systems For ROS see HPI     Objective:   Physical Exam  Constitutional: She is oriented to person, place, and time. No distress.  Thin, frail elderly woman  HENT:  Head: Normocephalic and atraumatic.  Cardiovascular: Intact distal pulses.   Musculoskeletal: She exhibits tenderness (TTP over lumbar spine and PSIS bilaterally). She exhibits no edema or deformity.  Pt is able to do hip rotations/pelvic gyrations without difficulty to try and reproduce the pain  Neurological: She is alert and oriented to person, place, and time.  Skin: Skin is warm and dry.  Psychiatric: She has a normal mood and affect. Her behavior is normal. Thought content normal.  Vitals reviewed.         Assessment & Plan:  Lumbar back pain- pt has hx of osteoporosis and multiple previous vertebral fxs.  Given her pain is similar to previous vertebral fxs, will get xray to assess.  Pt already has available hydrocodone.  Feels improvement w/ NSAIDs.  Encouraged her to take prn.  If fx present, will refer to neurosurgery or IR.  Reviewed supportive care and red flags that should prompt return.  Pt expressed understanding and is in agreement w/ plan.

## 2016-12-16 NOTE — Progress Notes (Signed)
Pre visit review using our clinic review tool, if applicable. No additional management support is needed unless otherwise documented below in the visit note. 

## 2016-12-19 DIAGNOSIS — S22089D Unspecified fracture of T11-T12 vertebra, subsequent encounter for fracture with routine healing: Secondary | ICD-10-CM | POA: Diagnosis not present

## 2016-12-21 ENCOUNTER — Telehealth: Payer: Self-pay | Admitting: Family Medicine

## 2016-12-21 ENCOUNTER — Emergency Department (HOSPITAL_COMMUNITY): Payer: PPO

## 2016-12-21 ENCOUNTER — Encounter (HOSPITAL_COMMUNITY): Payer: Self-pay | Admitting: *Deleted

## 2016-12-21 ENCOUNTER — Observation Stay (HOSPITAL_COMMUNITY)
Admission: EM | Admit: 2016-12-21 | Discharge: 2016-12-22 | Disposition: A | Discharge status: Home / Self Care | Payer: PPO | Attending: Family Medicine | Admitting: Family Medicine

## 2016-12-21 DIAGNOSIS — R0902 Hypoxemia: Secondary | ICD-10-CM

## 2016-12-21 DIAGNOSIS — C220 Liver cell carcinoma: Secondary | ICD-10-CM | POA: Diagnosis not present

## 2016-12-21 DIAGNOSIS — Z791 Long term (current) use of non-steroidal anti-inflammatories (NSAID): Secondary | ICD-10-CM | POA: Diagnosis not present

## 2016-12-21 DIAGNOSIS — Z79891 Long term (current) use of opiate analgesic: Secondary | ICD-10-CM | POA: Diagnosis not present

## 2016-12-21 DIAGNOSIS — M797 Fibromyalgia: Secondary | ICD-10-CM | POA: Diagnosis not present

## 2016-12-21 DIAGNOSIS — J209 Acute bronchitis, unspecified: Secondary | ICD-10-CM | POA: Diagnosis not present

## 2016-12-21 DIAGNOSIS — Z882 Allergy status to sulfonamides status: Secondary | ICD-10-CM | POA: Diagnosis not present

## 2016-12-21 DIAGNOSIS — Z888 Allergy status to other drugs, medicaments and biological substances status: Secondary | ICD-10-CM | POA: Insufficient documentation

## 2016-12-21 DIAGNOSIS — J449 Chronic obstructive pulmonary disease, unspecified: Secondary | ICD-10-CM | POA: Diagnosis not present

## 2016-12-21 DIAGNOSIS — J9601 Acute respiratory failure with hypoxia: Secondary | ICD-10-CM | POA: Insufficient documentation

## 2016-12-21 DIAGNOSIS — Z880 Allergy status to penicillin: Secondary | ICD-10-CM | POA: Diagnosis not present

## 2016-12-21 DIAGNOSIS — R0789 Other chest pain: Secondary | ICD-10-CM

## 2016-12-21 DIAGNOSIS — M810 Age-related osteoporosis without current pathological fracture: Secondary | ICD-10-CM | POA: Insufficient documentation

## 2016-12-21 DIAGNOSIS — R079 Chest pain, unspecified: Secondary | ICD-10-CM | POA: Diagnosis not present

## 2016-12-21 DIAGNOSIS — E440 Moderate protein-calorie malnutrition: Secondary | ICD-10-CM | POA: Insufficient documentation

## 2016-12-21 DIAGNOSIS — J441 Chronic obstructive pulmonary disease with (acute) exacerbation: Principal | ICD-10-CM | POA: Insufficient documentation

## 2016-12-21 DIAGNOSIS — Z9109 Other allergy status, other than to drugs and biological substances: Secondary | ICD-10-CM | POA: Diagnosis not present

## 2016-12-21 DIAGNOSIS — R0602 Shortness of breath: Secondary | ICD-10-CM | POA: Diagnosis not present

## 2016-12-21 DIAGNOSIS — Z79899 Other long term (current) drug therapy: Secondary | ICD-10-CM | POA: Diagnosis not present

## 2016-12-21 DIAGNOSIS — M1990 Unspecified osteoarthritis, unspecified site: Secondary | ICD-10-CM | POA: Insufficient documentation

## 2016-12-21 DIAGNOSIS — Z720 Tobacco use: Secondary | ICD-10-CM

## 2016-12-21 LAB — BASIC METABOLIC PANEL
Anion gap: 9 (ref 5–15)
BUN: 12 mg/dL (ref 6–20)
CO2: 29 mmol/L (ref 22–32)
Calcium: 9 mg/dL (ref 8.9–10.3)
Chloride: 94 mmol/L — ABNORMAL LOW (ref 101–111)
Creatinine, Ser: 0.57 mg/dL (ref 0.44–1.00)
GFR calc Af Amer: >60 mL/min (ref >60)
GFR calc non Af Amer: >60 mL/min (ref >60)
Glucose, Bld: 116 mg/dL — ABNORMAL HIGH (ref 65–99)
Potassium: 4.2 mmol/L (ref 3.5–5.1)
Sodium: 132 mmol/L — ABNORMAL LOW (ref 135–145)

## 2016-12-21 LAB — CBC
HCT: 42.1 % (ref 36.0–46.0)
Hemoglobin: 13.7 g/dL (ref 12.0–15.0)
MCH: 30.6 pg (ref 26.0–34.0)
MCHC: 32.5 g/dL (ref 30.0–36.0)
MCV: 94 fL (ref 78.0–100.0)
Platelets: 238 10*3/uL (ref 150–400)
RBC: 4.48 MIL/uL (ref 3.87–5.11)
RDW: 13.2 % (ref 11.5–15.5)
WBC: 10.2 10*3/uL (ref 4.0–10.5)

## 2016-12-21 LAB — I-STAT TROPONIN, ED: Troponin i, poc: 0.01 ng/mL (ref 0.00–0.08)

## 2016-12-21 MED ORDER — ALBUTEROL SULFATE (2.5 MG/3ML) 0.083% IN NEBU
2.5000 mg | INHALATION_SOLUTION | RESPIRATORY_TRACT | Status: DC | PRN
  Filled 2016-12-21: qty 3

## 2016-12-21 MED ORDER — NICOTINE 14 MG/24HR TD PT24
14.0000 mg | MEDICATED_PATCH | Freq: Every day | TRANSDERMAL | Status: DC
  Administered 2016-12-21 – 2016-12-22 (×2): 14 mg via TRANSDERMAL
  Filled 2016-12-21 (×2): qty 1

## 2016-12-21 MED ORDER — IPRATROPIUM BROMIDE 0.02 % IN SOLN
0.5000 mg | Freq: Four times a day (QID) | RESPIRATORY_TRACT | Status: DC
  Filled 2016-12-21: qty 2.5

## 2016-12-21 MED ORDER — ENSURE ENLIVE PO LIQD
237.0000 mL | ORAL | Status: DC
  Filled 2016-12-21: qty 237

## 2016-12-21 MED ORDER — AZITHROMYCIN 250 MG PO TABS
500.0000 mg | ORAL_TABLET | Freq: Once | ORAL | Status: AC
  Administered 2016-12-21: 500 mg via ORAL
  Filled 2016-12-21: qty 2

## 2016-12-21 MED ORDER — CITALOPRAM HYDROBROMIDE 20 MG PO TABS
10.0000 mg | ORAL_TABLET | Freq: Every day | ORAL | Status: DC
  Administered 2016-12-22: 10 mg via ORAL
  Filled 2016-12-21: qty 1

## 2016-12-21 MED ORDER — MORPHINE SULFATE (PF) 4 MG/ML IV SOLN
4.0000 mg | Freq: Once | INTRAVENOUS | Status: AC
  Administered 2016-12-21: 4 mg via INTRAVENOUS
  Filled 2016-12-21: qty 1

## 2016-12-21 MED ORDER — IBUPROFEN 200 MG PO TABS: 200.0000 mg | ORAL_TABLET | Freq: Four times a day (QID) | ORAL | Status: DC | PRN

## 2016-12-21 MED ORDER — ADULT MULTIVITAMIN W/MINERALS CH
1.0000 | ORAL_TABLET | Freq: Every day | ORAL | Status: DC
  Administered 2016-12-22: 1 via ORAL
  Filled 2016-12-21: qty 1

## 2016-12-21 MED ORDER — MOMETASONE FURO-FORMOTEROL FUM 200-5 MCG/ACT IN AERO
2.0000 | INHALATION_SPRAY | Freq: Two times a day (BID) | RESPIRATORY_TRACT | Status: DC
  Filled 2016-12-21: qty 8.8

## 2016-12-21 MED ORDER — ONDANSETRON HCL 4 MG/2ML IJ SOLN: 4.0000 mg | Freq: Four times a day (QID) | INTRAMUSCULAR | Status: DC | PRN

## 2016-12-21 MED ORDER — ONDANSETRON HCL 4 MG PO TABS: 4.0000 mg | ORAL_TABLET | Freq: Four times a day (QID) | ORAL | Status: DC | PRN

## 2016-12-21 MED ORDER — ACETAMINOPHEN 325 MG PO TABS: 650.0000 mg | ORAL_TABLET | Freq: Four times a day (QID) | ORAL | Status: DC | PRN

## 2016-12-21 MED ORDER — VITAMIN D 1000 UNITS PO TABS
5000.0000 [IU] | ORAL_TABLET | Freq: Every day | ORAL | Status: DC
  Administered 2016-12-22: 5000 [IU] via ORAL
  Filled 2016-12-21: qty 5

## 2016-12-21 MED ORDER — DEXTROSE 5 % IV SOLN
1.0000 g | Freq: Once | INTRAVENOUS | Status: AC
  Administered 2016-12-21: 1 g via INTRAVENOUS
  Filled 2016-12-21: qty 10

## 2016-12-21 MED ORDER — ACETAMINOPHEN 650 MG RE SUPP: 650.0000 mg | Freq: Four times a day (QID) | RECTAL | Status: DC | PRN

## 2016-12-21 MED ORDER — OXYCODONE-ACETAMINOPHEN 5-325 MG PO TABS
1.0000 | ORAL_TABLET | Freq: Once | ORAL | Status: AC
  Administered 2016-12-21: 1 via ORAL
  Filled 2016-12-21: qty 1

## 2016-12-21 MED ORDER — ONDANSETRON HCL 4 MG/2ML IJ SOLN
4.0000 mg | Freq: Once | INTRAMUSCULAR | Status: AC
  Administered 2016-12-21: 4 mg via INTRAVENOUS
  Filled 2016-12-21: qty 2

## 2016-12-21 MED ORDER — HYDROCODONE-ACETAMINOPHEN 5-325 MG PO TABS
1.0000 | ORAL_TABLET | Freq: Three times a day (TID) | ORAL | Status: DC | PRN
  Administered 2016-12-22: 1 via ORAL
  Filled 2016-12-21: qty 1

## 2016-12-21 MED ORDER — IPRATROPIUM-ALBUTEROL 0.5-2.5 (3) MG/3ML IN SOLN
3.0000 mL | Freq: Once | RESPIRATORY_TRACT | Status: AC
  Administered 2016-12-21: 3 mL via RESPIRATORY_TRACT
  Filled 2016-12-21: qty 3

## 2016-12-21 MED ORDER — VITAMIN B-12 1000 MCG PO TABS
1000.0000 ug | ORAL_TABLET | Freq: Every day | ORAL | Status: DC
  Administered 2016-12-22: 1000 ug via ORAL
  Filled 2016-12-21: qty 1

## 2016-12-21 MED ORDER — METHYLPREDNISOLONE SODIUM SUCC 125 MG IJ SOLR
125.0000 mg | Freq: Once | INTRAMUSCULAR | Status: AC
  Administered 2016-12-21: 125 mg via INTRAVENOUS
  Filled 2016-12-21: qty 2

## 2016-12-21 MED ORDER — ENOXAPARIN SODIUM 40 MG/0.4ML ~~LOC~~ SOLN
40.0000 mg | Freq: Every day | SUBCUTANEOUS | Status: DC
  Filled 2016-12-21: qty 0.4

## 2016-12-21 MED ORDER — PREDNISONE 20 MG PO TABS
40.0000 mg | ORAL_TABLET | Freq: Every day | ORAL | Status: DC
  Administered 2016-12-22: 40 mg via ORAL
  Filled 2016-12-21: qty 2

## 2016-12-21 MED ORDER — ALBUTEROL (5 MG/ML) CONTINUOUS INHALATION SOLN
10.0000 mg/h | INHALATION_SOLUTION | Freq: Once | RESPIRATORY_TRACT | Status: AC
  Administered 2016-12-21: 10 mg/h via RESPIRATORY_TRACT
  Filled 2016-12-21: qty 20

## 2016-12-21 MED ORDER — DEXTROSE 5 % IV SOLN
1.0000 g | INTRAVENOUS | Status: DC
  Filled 2016-12-21: qty 10

## 2016-12-21 NOTE — Telephone Encounter (Signed)
FYI

## 2016-12-21 NOTE — ED Notes (Signed)
Pt transported to xray 

## 2016-12-21 NOTE — ED Notes (Signed)
MD at bedside.  Aware of HR and O2 level.  Pt placed on 2L [place] and made aware of admission status.

## 2016-12-21 NOTE — ED Notes (Signed)
MD aware of patient's O2 needs and SpO2 readings

## 2016-12-21 NOTE — H&P (Signed)
History and Physical    Tina Bailey WLN:[id number] DOB: 02/18/1946 DOA: 12/21/2016  PCP: Midge Minium, MD  Patient coming from: Home  I have personally briefly reviewed patient's old medical records in [place]  Chief Complaint: Cough, SOB  HPI: Tina Bailey is a 71 y.o. female with medical history significant of COPD, [place] in remission as of recently (see Dr. Ernestina Penna office note from 8/1).  Patient presents to the ED with c/o chest pain and SOB.  Has had SOB and cough over past week, R sided CP onset today.  Of note she had quit smoking ~2 years ago, but started back again ~ 1 month ago.  Symptoms are severe, nothing makes symptoms better or worse, symptoms persistent.   ED Course: Severe wheezing on arrival.  Improved some with neb treatments and solumedrol but still has O2 requirement with ambulation.   Review of Systems: As per HPI otherwise 10 point review of systems negative.   Past Medical History:  Diagnosis Date  . Arthritis    DDD. Right shoulder"is frozen"-limited ROM. osteoporosis.  . Blue toe syndrome ([place]) 11/27/2014   Dr. Claudia Pollock evaluating  . Cancer ([place]) 12/2014   liver cancer  . Complication of anesthesia   . COPD (chronic obstructive pulmonary disease) ([place])   . Fibromyalgia   . Fracture of rib of right side    hx "osteoporosis"- states her dog nudge her on the side, next day developed great pain and was told has a fracture rib.  . Macular degeneration    R eye  . Osteoporosis   . PONV (postoperative nausea and vomiting)    nausea,severe vomiting after 01-13-15 portal vein embolization  . Productive cough   . Retina disorder    L eye, vision distorted, edema  . Spine fracture due to birth trauma   . TMJ disease   . Wears glasses     Past Surgical History:  Procedure Laterality Date  . ANGIOPLASTY  2008   no stents required, no follow-up with cardiologist, no recurrent chest pain  . CATARACT EXTRACTION, BILATERAL  Bilateral   . ESOPHAGOGASTRODUODENOSCOPY (EGD) WITH PROPOFOL N/A 06/25/2015   Procedure: ESOPHAGOGASTRODUODENOSCOPY (EGD) WITH PROPOFOL;  Surgeon: Milus Banister, MD;  Location: [facility];  Service: Endoscopy;  Laterality: N/A;  stent removal   . EYE SURGERY     cornea surgery  . KIDNEY STONE SURGERY    . LAPAROSCOPIC PARTIAL HEPATECTOMY N/A 03/04/2015   Procedure: DIAGNOSTIC LAPAROSCOPY, EXTENDED RIGHT HEPATECTOMY, WITH INTRAOPERATIVE ULTRASOUND;  Surgeon: Stark Klein, MD;  Location: [facility];  Service: General;  Laterality: N/A;  . PARTIAL HYSTERECTOMY    . portal vein embolization     01-13-15 -Dr. Cathlean Sauer.     reports that she quit smoking about 8 weeks ago. Her smoking use included Cigarettes. She has a 45.00 pack-year smoking history. She has never used smokeless tobacco. She reports that she does not drink alcohol or use drugs.  Allergies  Allergen Reactions  . Amoxicillin-Pot Clavulanate Nausea Only    Has patient had a PCN reaction causing immediate rash, facial/tongue/throat swelling, SOB or lightheadedness with hypotension: No Has patient had a PCN reaction causing severe rash involving mucus membranes or skin necrosis: No Has patient had a PCN reaction that required hospitalization No Has patient had a PCN reaction occurring within the last 10 years: No If all of the above answers are "NO", then may proceed with Cephalosporin use.   . Clindamycin/Lincomycin Other (See Comments)  Felt like it "burned out" her stomach/took a large dose  . Paroxetine Hcl Rash  . Sulfa Antibiotics Rash    Family History  Problem Relation Age of Onset  . Hypertension Mother   . Atrial fibrillation Mother   . Heart disease Mother        after age 47  . Stroke Father   . Dementia Father   . Heart disease Brother        After age 89- A-Fib  . Heart attack Brother      Prior to Admission medications   Medication Sig Start Date End Date Taking? Authorizing Provider    Calcium Carbonate-Vitamin D (CALCIUM 500+D HIGH POTENCY PO) Take 1 tablet by mouth daily.    Yes [provider]  Cholecalciferol (VITAMIN D3) 5000 units CAPS Take 5,000 Units by mouth daily.   Yes [provider]  citalopram (CELEXA) 10 MG tablet Take 1 tablet (10 mg total) by mouth daily. 10/05/16  Yes Midge Minium, MD  Cyanocobalamin (VITAMIN B-12) 1000 MCG SUBL Place 1,000 mcg under the tongue daily.    Yes [provider]  ENSURE (ENSURE) Take 237 mLs by mouth daily.    Yes [provider]  HYDROcodone-acetaminophen (NORCO/VICODIN) 5-325 MG tablet Take 1 tablet by mouth 3 (three) times daily.  11/07/15  Yes [provider]  ibuprofen (ADVIL,MOTRIN) 200 MG tablet Take 200 mg by mouth every 6 (six) hours as needed (for pain or headaches).   Yes [provider]  Multiple Vitamins-Minerals (CENTRUM SILVER 50+WOMEN) TABS Take 1 tablet by mouth daily.   Yes [provider]  OVER THE COUNTER MEDICATION TriVita (Amino acids complex): Mix 1 teaspoonful into 8 ounces of water and drink two times a week   Yes [provider]    Physical Exam: Vitals:   12/21/16 1930 12/21/16 2030 12/21/16 2100 12/21/16 2200  BP: 140/65 (!) 120/53 (!) 97/46 (!) 101/45  Pulse: 85 (!) 114 (!) 125 (!) 111  Resp: 17 16 14 18   SpO2: 100% 100% (!) 89% 96%    Constitutional: NAD, calm, comfortable Eyes: PERRL, lids and conjunctivae normal ENMT: Mucous membranes are moist. Posterior pharynx clear of any exudate or lesions.Normal dentition.  Neck: normal, supple, no masses, no thyromegaly Respiratory: Few diffuse wheezes Cardiovascular: Tachycardic, regular rhythm Abdomen: no tenderness, no masses palpated. No hepatosplenomegaly. Bowel sounds positive.  Musculoskeletal: no clubbing / cyanosis. No joint deformity upper and lower extremities. Good ROM, no contractures. Normal muscle tone.  Skin: no rashes, lesions, ulcers. No  induration Neurologic: CN 2-12 grossly intact. Sensation intact, DTR normal. Strength 5/5 in all 4.  Psychiatric: Normal judgment and insight. Alert and oriented x 3. Normal mood.    Labs on Admission: I have personally reviewed following labs and imaging studies  CBC:  Recent Labs Lab 12/21/16 1612  WBC 10.2  HGB 13.7  HCT 42.1  MCV 94.0  PLT 841   Basic Metabolic Panel:  Recent Labs Lab 12/21/16 1612  NA 132*  K 4.2  CL 94*  CO2 29  GLUCOSE 116*  BUN 12  CREATININE 0.57  CALCIUM 9.0   GFR: Estimated Creatinine Clearance: 47.6 mL/min (by C-G formula based on SCr of 0.57 mg/dL). Liver Function Tests: No results for input(s): AST, ALT, ALKPHOS, BILITOT, PROT, ALBUMIN in the last 168 hours. No results for input(s): LIPASE, AMYLASE in the last 168 hours. No results for input(s): AMMONIA in the last 168 hours. Coagulation Profile: No results for input(s): INR,  PROTIME in the last 168 hours. Cardiac Enzymes: No results for input(s): CKTOTAL, CKMB, CKMBINDEX, TROPONINI in the last 168 hours. BNP (last 3 results) No results for input(s): PROBNP in the last 8760 hours. HbA1C: No results for input(s): HGBA1C in the last 72 hours. CBG: No results for input(s): GLUCAP in the last 168 hours. Lipid Profile: No results for input(s): CHOL, HDL, LDLCALC, TRIG, CHOLHDL, LDLDIRECT in the last 72 hours. Thyroid Function Tests: No results for input(s): TSH, T4TOTAL, FREET4, T3FREE, THYROIDAB in the last 72 hours. Anemia Panel: No results for input(s): VITAMINB12, FOLATE, FERRITIN, TIBC, IRON, RETICCTPCT in the last 72 hours. Urine analysis:    Component Value Date/Time   COLORURINE YELLOW 06/10/2015 2009   APPEARANCEUR CLEAR 06/10/2015 2009   LABSPEC 1.012 06/10/2015 2009   PHURINE 7.0 06/10/2015 2009   GLUCOSEU NEGATIVE 06/10/2015 2009   HGBUR NEGATIVE 06/10/2015 2009   BILIRUBINUR negative 07/13/2016 1431   KETONESUR NEGATIVE 06/10/2015 2009   PROTEINUR negative  07/13/2016 1431   PROTEINUR NEGATIVE 06/10/2015 2009   UROBILINOGEN 0.2 07/13/2016 1431   NITRITE negative 07/13/2016 1431   NITRITE NEGATIVE 06/10/2015 2009   LEUKOCYTESUR Trace (A) 07/13/2016 1431    Radiological Exams on Admission: Dg Chest 2 View  Result Date: 12/21/2016 CLINICAL DATA:  Right-sided chest pain, shortness of breath, and chest congestion. COPD. EXAM: CHEST  2 VIEW COMPARISON:  05/02/2016 FINDINGS: The heart size and pulmonary vascularity are normal. No infiltrates or effusions. Chronic hyperinflation of the lungs with flattening of the diaphragm. Multiple old compression fractures in the thoracic spine. IMPRESSION: No acute abnormalities.  Emphysema. Electronically Signed   By: Lorriane Shire M.D.   On: 12/21/2016 17:09    EKG: Independently reviewed.  Assessment/Plan Principal Problem:   COPD with acute exacerbation (HCC) Active Problems:   Hepatocellular carcinoma (HCC)   COPD GOLD III    Acute respiratory failure with hypoxia (HCC)    1. COPD exacerbation - with new O2 requirement 1. COPD pathway 2. Prednisone 40 daily 3. Neb treatments 4. Rocephin 2. [place] - 1. Felt to be in remission, last neg MRI was end of July, see Dr. Burr Medico office notes for details.  DVT prophylaxis: Lovenox Code Status: Full Family Communication: Family at bedside Disposition Plan: Home after admit Consults called: None Admission status: Admit to inpatient - patient with new O2 requirement   Etta Quill DO [facility] Pager [phone]  If 7AM-7PM, please contact day team taking care of patient [email or web address] Password [email or web address]  12/21/2016, 10:17 PM

## 2016-12-21 NOTE — ED Notes (Addendum)
Respiratory aware of hour long neb order

## 2016-12-21 NOTE — ED Triage Notes (Signed)
To ED for eval of right side cp for the past 4 days. States she has been taking Hydrocodone at home which has been relieving pain a little. Denies trauma. Denies vomiting. Denies sob.

## 2016-12-21 NOTE — Telephone Encounter (Signed)
Patient Name: Tina Bailey  DOB: [birth year]    Initial Comment Caller taking hydrocodone, but still having pain and chest tightness.    Nurse Assessment  Nurse: Leilani Merl, RN, Heather Date/Time (Eastern Time): 12/21/2016 2:43:42 PM  Confirm and document reason for call. If symptomatic, describe symptoms. ---caller states that she started with chest pain and tightness over the weekend and her pain medication is not working.  Does the patient have any new or worsening symptoms? ---Yes  Will a triage be completed? ---Yes  Related visit to physician within the last 2 weeks? ---No  Does the PT have any chronic conditions? (i.e. diabetes, asthma, etc.) ---Yes  List chronic conditions. ---See MR  Is this a behavioral health or substance abuse call? ---No     Guidelines    Guideline Title Affirmed Question Affirmed Notes  Chest Pain [1] Chest pain lasts > 5 minutes AND [2] age > 82    Final Disposition User   Call EMS 911 Now [place], Therapist, sports, [employer]

## 2016-12-21 NOTE — ED Notes (Signed)
Patient on breathing treatment

## 2016-12-21 NOTE — ED Notes (Signed)
MD at bedside updating patient at this time

## 2016-12-21 NOTE — Telephone Encounter (Signed)
Pt is in ED now.

## 2016-12-21 NOTE — ED Notes (Signed)
Pt O2 when no on breathing treatment down to 86%.  Will place on 2L [place]

## 2016-12-21 NOTE — ED Provider Notes (Signed)
[facility] DEPT Provider Note   CSN: [id number] Arrival date & time: 12/21/16  1600     History   Chief Complaint Chief Complaint  Patient presents with  . Chest Pain    HPI Tina Bailey is a 71 y.o. female.  Pt presents to the ED today with right sided chest pain.  Pt said she has been taking hydrocodone at home, but it is no longer helping.  Pt denies any fevers, but has had sob and cough.  The pt does smoke.      Past Medical History:  Diagnosis Date  . Arthritis    DDD. Right shoulder"is frozen"-limited ROM. osteoporosis.  . Blue toe syndrome ([place]) 11/27/2014   Dr. Claudia Pollock evaluating  . Cancer ([place]) 12/2014   liver cancer  . Complication of anesthesia   . COPD (chronic obstructive pulmonary disease) ([place])   . Fibromyalgia   . Fracture of rib of right side    hx "osteoporosis"- states her dog nudge her on the side, next day developed great pain and was told has a fracture rib.  . Macular degeneration    R eye  . Osteoporosis   . PONV (postoperative nausea and vomiting)    nausea,severe vomiting after 01-13-15 portal vein embolization  . Productive cough   . Retina disorder    L eye, vision distorted, edema  . Spine fracture due to birth trauma   . TMJ disease   . Wears glasses     Patient Active Problem List   Diagnosis Date Noted  . Anxiety about health 09/27/2016  . Osteoporosis 07/13/2016  . Chronic narcotic use 07/13/2016  . Constipation 04/26/2015  . Chronic pain 04/26/2015  . Depression 04/26/2015  . COPD GOLD III  03/03/2015  . Cigarette smoker 03/03/2015  . Cough 03/03/2015  . Liver cancer ([place]) 01/13/2015  . Hepatocellular carcinoma ([place]) 12/30/2014  . Pancreatic mass 12/04/2014  . Blue toe syndrome ([place]) 11/27/2014    Past Surgical History:  Procedure Laterality Date  . ANGIOPLASTY  2008   no stents required, no follow-up with cardiologist, no recurrent chest pain  . CATARACT EXTRACTION, BILATERAL Bilateral   .  ESOPHAGOGASTRODUODENOSCOPY (EGD) WITH PROPOFOL N/A 06/25/2015   Procedure: ESOPHAGOGASTRODUODENOSCOPY (EGD) WITH PROPOFOL;  Surgeon: Milus Banister, MD;  [facility];  Service: Endoscopy;  Laterality: N/A;  stent removal   . EYE SURGERY     cornea surgery  . KIDNEY STONE SURGERY    . LAPAROSCOPIC PARTIAL HEPATECTOMY N/A 03/04/2015   Procedure: DIAGNOSTIC LAPAROSCOPY, EXTENDED RIGHT HEPATECTOMY, WITH INTRAOPERATIVE ULTRASOUND;  Surgeon: Stark Klein, MD;  Location: [facility];  Service: General;  Laterality: N/A;  . PARTIAL HYSTERECTOMY    . portal vein embolization     01-13-15 -Dr. Cathlean Sauer.    OB History    No data available       Home Medications    Prior to Admission medications   Medication Sig Start Date End Date Taking? Authorizing Provider  Calcium Carbonate-Vitamin D (CALCIUM 500+D HIGH POTENCY PO) Take 1 tablet by mouth daily.    Yes [provider]  Cholecalciferol (VITAMIN D3) 5000 units CAPS Take 5,000 Units by mouth daily.   Yes [provider]  citalopram (CELEXA) 10 MG tablet Take 1 tablet (10 mg total) by mouth daily. 10/05/16  Yes Midge Minium, MD  Cyanocobalamin (VITAMIN B-12) 1000 MCG SUBL Place 1,000 mcg under the tongue daily.    Yes [provider]  ENSURE (ENSURE) Take 237  mLs by mouth daily.    Yes [provider]  HYDROcodone-acetaminophen (NORCO/VICODIN) 5-325 MG tablet Take 1 tablet by mouth 3 (three) times daily.  11/07/15  Yes [provider]  ibuprofen (ADVIL,MOTRIN) 200 MG tablet Take 200 mg by mouth every 6 (six) hours as needed (for pain or headaches).   Yes [provider]  Multiple Vitamins-Minerals (CENTRUM SILVER 50+WOMEN) TABS Take 1 tablet by mouth daily.   Yes [provider]  OVER THE COUNTER MEDICATION TriVita (Amino acids complex): Mix 1 teaspoonful into 8 ounces of water and drink two times a week   Yes [provider]    Family History Family  History  Problem Relation Age of Onset  . Hypertension Mother   . Atrial fibrillation Mother   . Heart disease Mother        after age 31  . Stroke Father   . Dementia Father   . Heart disease Brother        After age 97- A-Fib  . Heart attack Brother     Social History Social History  Substance Use Topics  . Smoking status: Former Smoker    Packs/day: 1.00    Years: 45.00    Types: Cigarettes    Quit date: 10/23/2016  . Smokeless tobacco: Never Used  . Alcohol use No     Allergies   Amoxicillin-pot clavulanate; Clindamycin/lincomycin; Paroxetine hcl; and Sulfa antibiotics   Review of Systems Review of Systems  Respiratory: Positive for cough, shortness of breath and wheezing.   All other systems reviewed and are negative.    Physical Exam Updated Vital Signs BP (!) 97/46   Pulse (!) 125   Resp 14   SpO2 (!) 89%   Physical Exam  Constitutional: She is oriented to person, place, and time. She appears well-developed. She appears distressed.  HENT:  Head: Normocephalic and atraumatic.  Right Ear: External ear normal.  Left Ear: External ear normal.  Nose: Nose normal.  Mouth/Throat: Oropharynx is clear and moist.  Eyes: Pupils are equal, round, and reactive to light. Conjunctivae and EOM are normal.  Neck: Normal range of motion. Neck supple.  Cardiovascular: Normal rate, regular rhythm, normal heart sounds and intact distal pulses.   Pulmonary/Chest: She has wheezes. She has rhonchi.  Abdominal: Soft. Bowel sounds are normal.  Musculoskeletal: Normal range of motion.  Neurological: She is alert and oriented to person, place, and time.  Skin: Skin is warm. Capillary refill takes less than 2 seconds.  Psychiatric: She has a normal mood and affect. Her behavior is normal. Judgment and thought content normal.  Nursing note and vitals reviewed.    ED Treatments / Results  Labs (all labs ordered are listed, but only abnormal results are displayed) Labs  Reviewed  BASIC METABOLIC PANEL - Abnormal; Notable for the following:       Result Value   Sodium 132 (*)    Chloride 94 (*)    Glucose, Bld 116 (*)    All other components within normal limits  CBC  I-STAT TROPONIN, ED    EKG  EKG Interpretation  Date/Time:  Wednesday December 21 2016 16:10:10 EDT Ventricular Rate:  87 PR Interval:  126 QRS Duration: 70 QT Interval:  346 QTC Calculation: 416 R Axis:   97 Text Interpretation:  Normal sinus rhythm Rightward axis Borderline ECG peaked T wave compared to previous T wave inversion in aVL is present on old tracing in 2012 Confirmed by Theotis Burrow [phone]) on 12/21/2016  4:13:28 PM Also confirmed by Theotis Burrow [phone]), editor Philomena Doheny [phone])  on 12/21/2016 4:14:01 PM       Radiology Dg Chest 2 View  Result Date: 12/21/2016 CLINICAL DATA:  Right-sided chest pain, shortness of breath, and chest congestion. COPD. EXAM: CHEST  2 VIEW COMPARISON:  05/02/2016 FINDINGS: The heart size and pulmonary vascularity are normal. No infiltrates or effusions. Chronic hyperinflation of the lungs with flattening of the diaphragm. Multiple old compression fractures in the thoracic spine. IMPRESSION: No acute abnormalities.  Emphysema. Electronically Signed   By: Lorriane Shire M.D.   On: 12/21/2016 17:09    Procedures Procedures (including critical care time)  Medications Ordered in ED Medications  methylPREDNISolone sodium succinate (SOLU-MEDROL) 125 mg/2 mL injection 125 mg (125 mg Intravenous Given 12/21/16 1800)  ipratropium-albuterol (DUONEB) 0.5-2.5 (3) MG/3ML nebulizer solution 3 mL (3 mLs Nebulization Given 12/21/16 1802)  morphine 4 MG/ML injection 4 mg (4 mg Intravenous Given 12/21/16 1757)  ondansetron (ZOFRAN) injection 4 mg (4 mg Intravenous Given 12/21/16 1756)  cefTRIAXone (ROCEPHIN) 1 g in dextrose 5 % 50 mL IVPB (0 g Intravenous Stopped 12/21/16 1942)  azithromycin (ZITHROMAX) tablet 500 mg (500 mg Oral Given 12/21/16 1850)    albuterol (PROVENTIL,VENTOLIN) solution continuous neb (10 mg/hr Nebulization Given 12/21/16 1920)  oxyCODONE-acetaminophen (PERCOCET/ROXICET) 5-325 MG per tablet 1 tablet (1 tablet Oral Given 12/21/16 2149)     Initial Impression / Assessment and Plan / ED Course  I have reviewed the triage vital signs and the nursing notes.  Pertinent labs & imaging results that were available during my care of the patient were reviewed by me and considered in my medical decision making (see chart for details).     Pt's O2 sat drops when she is taken off oxygen.  On 2L, oxygenation is good.  Pt's heart rate also shoots up when she tries to move.  Pt d/w Dr. Alcario Drought ([facility]) for admission.  Final Clinical Impressions(s) / ED Diagnoses   Final diagnoses:  COPD exacerbation ([place])  Tobacco abuse  Acute bronchitis, unspecified organism  Chest wall pain  Hypoxia    New Prescriptions New Prescriptions   No medications on file     [name], MD 12/21/16 2206

## 2016-12-21 NOTE — Telephone Encounter (Signed)
Reviewing in PCP absence. Agree with EMS disposition. Please keep a check on her admission to ER.

## 2016-12-22 ENCOUNTER — Encounter (HOSPITAL_COMMUNITY): Payer: Self-pay | Admitting: *Deleted

## 2016-12-22 DIAGNOSIS — E440 Moderate protein-calorie malnutrition: Secondary | ICD-10-CM | POA: Diagnosis not present

## 2016-12-22 DIAGNOSIS — C220 Liver cell carcinoma: Secondary | ICD-10-CM | POA: Diagnosis not present

## 2016-12-22 DIAGNOSIS — J209 Acute bronchitis, unspecified: Secondary | ICD-10-CM | POA: Diagnosis not present

## 2016-12-22 DIAGNOSIS — J441 Chronic obstructive pulmonary disease with (acute) exacerbation: Secondary | ICD-10-CM | POA: Diagnosis not present

## 2016-12-22 LAB — BASIC METABOLIC PANEL
Anion gap: 14 (ref 5–15)
BUN: 15 mg/dL (ref 6–20)
CO2: 28 mmol/L (ref 22–32)
Calcium: 9.1 mg/dL (ref 8.9–10.3)
Chloride: 91 mmol/L — ABNORMAL LOW (ref 101–111)
Creatinine, Ser: 0.73 mg/dL (ref 0.44–1.00)
GFR calc Af Amer: >60 mL/min (ref >60)
GFR calc non Af Amer: >60 mL/min (ref >60)
Glucose, Bld: 208 mg/dL — ABNORMAL HIGH (ref 65–99)
Potassium: 3.4 mmol/L — ABNORMAL LOW (ref 3.5–5.1)
Sodium: 133 mmol/L — ABNORMAL LOW (ref 135–145)

## 2016-12-22 LAB — HEPATIC FUNCTION PANEL
ALT: 35 U/L (ref 14–54)
AST: 48 U/L — ABNORMAL HIGH (ref 15–41)
Albumin: 3.8 g/dL (ref 3.5–5.0)
Alkaline Phosphatase: 87 U/L (ref 38–126)
Bilirubin, Direct: <0.1 mg/dL — ABNORMAL LOW (ref 0.1–0.5)
Total Bilirubin: 0.5 mg/dL (ref 0.3–1.2)
Total Protein: 6.9 g/dL (ref 6.5–8.1)

## 2016-12-22 MED ORDER — PREDNISONE 20 MG PO TABS: 40.0000 mg | ORAL_TABLET | Freq: Every day | ORAL | 0 refills | Status: AC

## 2016-12-22 MED ORDER — ORAL CARE MOUTH RINSE
15.0000 mL | Freq: Two times a day (BID) | OROMUCOSAL | Status: DC
  Administered 2016-12-22: 15 mL via OROMUCOSAL

## 2016-12-22 MED ORDER — GUAIFENESIN ER 600 MG PO TB12: 1200.0000 mg | ORAL_TABLET | Freq: Two times a day (BID) | ORAL | 0 refills | Status: DC

## 2016-12-22 MED ORDER — ALBUTEROL SULFATE HFA 108 (90 BASE) MCG/ACT IN AERS: 2.0000 | INHALATION_SPRAY | Freq: Four times a day (QID) | RESPIRATORY_TRACT | 2 refills | Status: DC | PRN

## 2016-12-22 MED ORDER — IPRATROPIUM-ALBUTEROL 0.5-2.5 (3) MG/3ML IN SOLN: 3.0000 mL | RESPIRATORY_TRACT | 0 refills | Status: DC | PRN

## 2016-12-22 MED ORDER — GUAIFENESIN ER 600 MG PO TB12
1200.0000 mg | ORAL_TABLET | Freq: Two times a day (BID) | ORAL | Status: DC
  Administered 2016-12-22: 1200 mg via ORAL
  Filled 2016-12-22: qty 2

## 2016-12-22 MED ORDER — BUDESONIDE-FORMOTEROL FUMARATE 160-4.5 MCG/ACT IN AERO: 2.0000 | INHALATION_SPRAY | Freq: Two times a day (BID) | RESPIRATORY_TRACT | 0 refills | Status: DC

## 2016-12-22 MED ORDER — ENSURE ENLIVE PO LIQD: 237.0000 mL | Freq: Two times a day (BID) | ORAL | Status: DC

## 2016-12-22 MED ORDER — ENSURE ENLIVE PO LIQD: 237.0000 mL | Freq: Two times a day (BID) | ORAL | 12 refills | Status: DC

## 2016-12-22 MED ORDER — IPRATROPIUM-ALBUTEROL 0.5-2.5 (3) MG/3ML IN SOLN: 3.0000 mL | RESPIRATORY_TRACT | Status: DC | PRN

## 2016-12-22 MED ORDER — KETOROLAC TROMETHAMINE 15 MG/ML IJ SOLN
15.0000 mg | Freq: Once | INTRAMUSCULAR | Status: AC
Start: 2016-12-22 — End: 2016-12-22
  Administered 2016-12-22: 15 mg via INTRAVENOUS
  Filled 2016-12-22: qty 1

## 2016-12-22 NOTE — Care Management CC44 (Signed)
Condition Code 44 Documentation Completed  Patient Details  Name: Tina Bailey MRN: [id number] Date of Birth: 1945-11-26   Condition Code 44 given:  Yes Patient signature on Condition Code 44 notice:  Yes Documentation of 2 MD's agreement:  Yes Code 44 added to claim:  Yes    Brigida Scotti, Rory Percy, RN 12/22/2016, 1:25 PM

## 2016-12-22 NOTE — Discharge Summary (Addendum)
Physician Discharge Summary  Tina Bailey:[id number] DOB: Sep 11, 1945 DOA: 12/21/2016  PCP: Midge Minium, MD  Admit date: 12/21/2016 Discharge date: 12/22/2016  Admitted From: Home Disposition: Home  Recommendations for Outpatient Follow-up:  1. Follow up with PCP in 1 week 2. Please obtain BMP/CBC in one week 3. Outpatient PFTs 4. Please follow up on the following pending results: None  Home Health: None Equipment/Devices: Oxygen, Nebulizer  Discharge Condition: Stable CODE STATUS: Full code Diet recommendation: Heart healthy   Brief/Interim Summary:  Admission HPI written by Etta Quill, DO  Chief Complaint: Cough, SOB  HPI: Tina Bailey is a 71 y.o. female with medical history significant of COPD, [place] in remission as of recently (see Dr. Ernestina Penna office note from 8/1).  Patient presents to the ED with c/o chest pain and SOB.  Has had SOB and cough over past week, R sided CP onset today.  Of note she had quit smoking ~2 years ago, but started back again ~ 1 month ago.  Symptoms are severe, nothing makes symptoms better or worse, symptoms persistent.   ED Course: Severe wheezing on arrival.  Improved some with neb treatments and solumedrol but still has O2 requirement with ambulation.   Hospital course:  COPD exacerbation Acute respiratory failure with hypoxia Patient started on ceftriaxone and azithromycin. She required 2L of oxygen to keep O2 saturations greater than 88%. She will continue a steroid burst on discharge. Oxygen provided on discharge in addition to albuterol, Duoneb and Pulmicort inhalers/nebs. Follow-up with primary care physician and recommend PFTs when stable.  Hepatocellular carcinoma Outpatient follow-up  Protein-calorie malnutrition, moderate Protein supplementation  Discharge Diagnoses:  Principal Problem:   COPD with acute exacerbation ([place]) Active Problems:   Hepatocellular carcinoma (HCC)   COPD GOLD III    Acute  respiratory failure with hypoxia (HCC)   Malnutrition of moderate degree    Discharge Instructions  Discharge Instructions    Call MD for:  difficulty breathing, headache or visual disturbances    Complete by:  As directed    Diet - low sodium heart healthy    Complete by:  As directed    Increase activity slowly    Complete by:  As directed      Allergies as of 12/22/2016      Reactions   Amoxicillin-pot Clavulanate Nausea Only   Has patient had a PCN reaction causing immediate rash, facial/tongue/throat swelling, SOB or lightheadedness with hypotension: No Has patient had a PCN reaction causing severe rash involving mucus membranes or skin necrosis: No Has patient had a PCN reaction that required hospitalization No Has patient had a PCN reaction occurring within the last 10 years: No If all of the above answers are "NO", then may proceed with Cephalosporin use.   Clindamycin/lincomycin Other (See Comments)   Felt like it "burned out" her stomach/took a large dose   Paroxetine Hcl Rash   Sulfa Antibiotics Rash      Medication List    TAKE these medications   albuterol 108 (90 Base) MCG/ACT inhaler Commonly known as:  PROVENTIL HFA;VENTOLIN HFA Inhale 2 puffs into the lungs every 6 (six) hours as needed for wheezing or shortness of breath.   budesonide-formoterol 160-4.5 MCG/ACT inhaler Commonly known as:  SYMBICORT Inhale 2 puffs into the lungs 2 (two) times daily.   CALCIUM 500+D HIGH POTENCY PO Take 1 tablet by mouth daily.   CENTRUM SILVER 50+WOMEN Tabs Take 1 tablet by mouth daily.   citalopram 10 MG  tablet Commonly known as:  CELEXA Take 1 tablet (10 mg total) by mouth daily.   ENSURE Take 237 mLs by mouth daily. What changed:  Another medication with the same name was added. Make sure you understand how and when to take each.   feeding supplement (ENSURE ENLIVE) Liqd Take 237 mLs by mouth 2 (two) times daily between meals. What changed:  You were already  taking a medication with the same name, and this prescription was added. Make sure you understand how and when to take each.   guaiFENesin 600 MG 12 hr tablet Commonly known as:  MUCINEX Take 2 tablets (1,200 mg total) by mouth 2 (two) times daily.   HYDROcodone-acetaminophen 5-325 MG tablet Commonly known as:  NORCO/VICODIN Take 1 tablet by mouth 3 (three) times daily.   ibuprofen 200 MG tablet Commonly known as:  ADVIL,MOTRIN Take 200 mg by mouth every 6 (six) hours as needed (for pain or headaches).   ipratropium-albuterol 0.5-2.5 (3) MG/3ML Soln Commonly known as:  DUONEB Take 3 mLs by nebulization every 4 (four) hours as needed (Wheezing or shortness of breath).   OVER THE COUNTER MEDICATION TriVita (Amino acids complex): Mix 1 teaspoonful into 8 ounces of water and drink two times a week   predniSONE 20 MG tablet Commonly known as:  DELTASONE Take 2 tablets (40 mg total) by mouth daily with breakfast.   Vitamin B-12 1000 MCG Subl Place 1,000 mcg under the tongue daily.   Vitamin D3 5000 units Caps Take 5,000 Units by mouth daily.            Durable Medical Equipment        Start     Ordered   12/22/16 1630  For home use only DME Nebulizer machine  Once    Question:  Patient needs a nebulizer to treat with the following condition  Answer:  COPD (chronic obstructive pulmonary disease) ([place])   12/22/16 1629   12/22/16 1142  For home use only DME oxygen  Once    Question Answer Comment  Mode or (Route) Nasal cannula   Liters per Minute 2   Frequency Continuous (stationary and portable oxygen unit needed)   Oxygen delivery system Gas      12/22/16 1141     Follow-up Information    Midge Minium, MD. Schedule an appointment as soon as possible for a visit in 1 week(s).   Specialty:  Family Medicine Contact information: [address] [place] [address] [phone]          Allergies  Allergen Reactions  . Amoxicillin-Pot Clavulanate  Nausea Only    Has patient had a PCN reaction causing immediate rash, facial/tongue/throat swelling, SOB or lightheadedness with hypotension: No Has patient had a PCN reaction causing severe rash involving mucus membranes or skin necrosis: No Has patient had a PCN reaction that required hospitalization No Has patient had a PCN reaction occurring within the last 10 years: No If all of the above answers are "NO", then may proceed with Cephalosporin use.   . Clindamycin/Lincomycin Other (See Comments)    Felt like it "burned out" her stomach/took a large dose  . Paroxetine Hcl Rash  . Sulfa Antibiotics Rash    Consultations:  None   Procedures/Studies: Dg Chest 2 View  Result Date: 12/21/2016 CLINICAL DATA:  Right-sided chest pain, shortness of breath, and chest congestion. COPD. EXAM: CHEST  2 VIEW COMPARISON:  05/02/2016 FINDINGS: The heart size and pulmonary vascularity are  normal. No infiltrates or effusions. Chronic hyperinflation of the lungs with flattening of the diaphragm. Multiple old compression fractures in the thoracic spine. IMPRESSION: No acute abnormalities.  Emphysema. Electronically Signed   By: Lorriane Shire M.D.   On: 12/21/2016 17:09   Dg Lumbar Spine Complete  Result Date: 12/16/2016 CLINICAL DATA:  Sudden sharp low back pain for the past 2 days. No known injury. History of osteoporosis and vertebral fractures. EXAM: LUMBAR SPINE - COMPLETE 4+ VIEW COMPARISON:  Abdomen MR dated 10/14/2016 and abdomen and pelvis CT dated 02/29/2016. FINDINGS: Five non-rib-bearing lumbar vertebrae. Compression deformities of the L3 and L4 vertebral bodies without significant change since 06/10/2015. T11 vertebral body compression deformity without significant change since 10/14/2016. No interval compression deformities. No bony retropulsion. Mild multilevel degenerative changes. Diffuse osteopenia. Atheromatous arterial calcifications. Abdominal surgical clips. IMPRESSION: 1. No acute  abnormality. 2. Old vertebral compression deformities, unchanged. 3. Mild degenerative changes. Electronically Signed   By: Claudie Revering M.D.   On: 12/16/2016 15:31     Subjective: Improved dyspnea. Feels close to baseline.  Discharge Exam: Vitals:   12/22/16 0637 12/22/16 0900  BP: (!) 102/41 (!) 112/48  Pulse: 75 68  Resp: 16 18  Temp: 98.7 F (37.1 C) 98.2 F (36.8 C)  SpO2: 99% 98%   Vitals:   12/22/16 0400 12/22/16 0626 12/22/16 0637 12/22/16 0900  BP: (!) 117/54  (!) 102/41 (!) 112/48  Pulse: 92  75 68  Resp: 18  16 18   Temp:   98.7 F (37.1 C) 98.2 F (36.8 C)  TempSrc:   Oral Oral  SpO2: 95%  99% 98%  Weight:  46.1 kg (101 lb 11.2 oz)    Height:  5\' 6"  (1.676 m)      General: Pt is alert, awake, not in acute distress Cardiovascular: RRR, S1/S2 +, no rubs, no gallops Respiratory: scattered wheezing and prolonged expiratory phase.  Abdominal: Soft, NT, ND, bowel sounds + Extremities: no edema, no cyanosis    The results of significant diagnostics from this hospitalization (including imaging, microbiology, ancillary and laboratory) are listed below for reference.     Microbiology:   Labs: Basic Metabolic Panel:  Recent Labs Lab 12/21/16 1612 12/22/16 0446  NA 132* 133*  K 4.2 3.4*  CL 94* 91*  CO2 29 28  GLUCOSE 116* 208*  BUN 12 15  CREATININE 0.57 0.73  CALCIUM 9.0 9.1   Liver Function Tests:  Recent Labs Lab 12/22/16 0446  AST 48*  ALT 35  ALKPHOS 87  BILITOT 0.5  PROT 6.9  ALBUMIN 3.8   CBC:  Recent Labs Lab 12/21/16 1612  WBC 10.2  HGB 13.7  HCT 42.1  MCV 94.0  PLT 238   Urinalysis    Component Value Date/Time   COLORURINE YELLOW 06/10/2015 2009   APPEARANCEUR CLEAR 06/10/2015 2009   LABSPEC 1.012 06/10/2015 2009   PHURINE 7.0 06/10/2015 2009   GLUCOSEU NEGATIVE 06/10/2015 2009   HGBUR NEGATIVE 06/10/2015 2009   BILIRUBINUR negative 07/13/2016 1431   KETONESUR NEGATIVE 06/10/2015 2009   PROTEINUR negative  07/13/2016 1431   PROTEINUR NEGATIVE 06/10/2015 2009   UROBILINOGEN 0.2 07/13/2016 1431   NITRITE negative 07/13/2016 1431   NITRITE NEGATIVE 06/10/2015 2009   LEUKOCYTESUR Trace (A) 07/13/2016 1431    SIGNED:   Cordelia Poche, MD [facility] 12/22/2016, 4:40 PM Pager [phone]  If 7PM-7AM, please contact night-coverage [email or web address] Password [email or web address]

## 2016-12-22 NOTE — Progress Notes (Signed)
SATURATION QUALIFICATIONS: (This note is used to comply with regulatory documentation for home oxygen)  Patient Saturations on Room Air at Rest = 85%  Patient Saturations on Room Air while Ambulating = 81%  Patient Saturations on 2 Liters of oxygen while Ambulating = 88%  Please briefly explain why patient needs home oxygen:

## 2016-12-22 NOTE — Consult Note (Signed)
           [facility] CM Primary Care Navigator  12/22/2016  REMMY CRASS 1945-09-29 [id number]   Wentto seepatient at the bedsideto identify possible discharge needs but she was already discharged per staff report.  Patient was just discharged home today.  Patient has a discharge instruction to follow-up with primary care provider in a week.  Primary care provider's office called Lattie Haw) to notify of patient's discharge, need for post hospital follow-up and transition of care and health issues needing follow-up.  Made aware to refer patient to [facility] care management if deemed necessary and appropriate for services.  For questions, please contact:  Dannielle Huh, BSN, RN- [facility] Primary Care Navigator  Telephone: [phone] [place]

## 2016-12-22 NOTE — Care Management Note (Addendum)
Case Management Note  Patient Details  Name: Tina Bailey MRN: [id number] Date of Birth: 10/12/1945  Subjective/Objective:      CM following for progression and d/c planning .              Action/Plan: 12/22/2016 Met with pt re [place] needs. Oxygen ordered from [facility] per pt wishes and tank to be delivered to pt room , plan to d/c this afternoon. This CM explained oxygen deliver to pt room and to home in detail to this pt and spoke by phone to the pt daughter, [name] per pt request. No other needs identified at this time.   Expected Discharge Date:  12/22/16               Expected Discharge Plan:  Home/Self Care  In-House Referral:  NA  Discharge planning Services  CM Consult  Post Acute Care Choice:  Durable Medical Equipment Choice offered to:  Patient  DME Arranged:  Oxygen Hand held nebulizer DME Agency:  [place]:  NA HH Agency:  NA  Status of Service:  Completed, signed off  If discussed at [place] of Stay Meetings, dates discussed:    Additional Comments:  Adron Bene, RN 12/22/2016, 12:34 PM

## 2016-12-22 NOTE — ED Notes (Signed)
Patient sitting on the side of the bed states she just isn't able to sleep, states she is hoping she will be able to go home later today.

## 2016-12-22 NOTE — Progress Notes (Signed)
Initial Nutrition Assessment  DOCUMENTATION CODES:   Non-severe (moderate) malnutrition in context of chronic illness, Underweight  INTERVENTION:   -Increase Ensure Enlive po to BID, each supplement provides 350 kcal and 20 grams of protein  NUTRITION DIAGNOSIS:   Malnutrition (Moderate) related to chronic illness (COPD) as evidenced by mild depletion of body fat, moderate depletion of body fat, mild depletion of muscle mass, moderate depletions of muscle mass.  GOAL:   Patient will meet greater than or equal to 90% of their needs  MONITOR:   PO intake, Supplement acceptance, Labs, Weight trends, Skin, I & O's  REASON FOR ASSESSMENT:   Consult COPD Protocol  ASSESSMENT:   Tina Bailey is a 71 y.o. female with medical history significant of COPD, [place] in remission as of recently (see Dr. Ernestina Penna office note from 8/1).  Patient presents to the ED with c/o chest pain and SOB.  Has had SOB and cough over past week, R sided CP onset today.  Of note she had quit smoking ~2 years ago, but started back again ~ 1 month ago.  Pt admitted with COPD exacerbation.   Spoke with pt, who reports weight loss as a result of cancer surgery in 2016. Over the past 1-2 years, pt reports she has been working to regain lost weight. Pt shares UBW is around 130# and her lowest weight was 95# over the past 102 years. Noted wt has 96# 2 months ago; pt has experienced a 5.2% wt loss over the past 2 months, which is favorable.   Pt consumes several small meals per day, including 1-2 Ensure supplements daily. She consumed 100% of her breakfast.   Nutrition-Focused physical exam completed. Findings are mild to moderate fat depletion, mild to moderate muscle depletion, and no edema.   Discussed with pt importance of continuing with smaller, more frequent meals, supplements, and high protein sources to help with weight restoration.   Medications reviewed and include vitamin B-12 and MVO.   Labs reviewed:  Na: 133, K: 3.4.   Diet Order:  Diet Heart Room service appropriate? Yes; Fluid consistency: Thin Diet - low sodium heart healthy  Skin:  Reviewed, no issues  Last BM:  12/21/16  Height:   Ht Readings from Last 1 Encounters:  12/22/16 5\' 6"  (1.676 m)    Weight:   Wt Readings from Last 1 Encounters:  12/22/16 101 lb 11.2 oz (46.1 kg)    Ideal Body Weight:  59.1 kg  BMI:  Body mass index is 16.41 kg/m.  Estimated Nutritional Needs:   Kcal:  1400-1600  Protein:  70-85 grams  Fluid:  1.4-1.6 L  EDUCATION NEEDS:   Education needs addressed  Jakera Beaupre A. Jimmye Norman, RD, LDN, CDE Pager: [phone] After hours Pager: [phone]

## 2016-12-22 NOTE — Evaluation (Signed)
Physical Therapy Evaluation Patient Details Name: Tina Bailey MRN: [id number] DOB: Aug 12, 1945 Today's Date: 12/22/2016   History of Present Illness  Pt is a 71 y.o. female with medical history significant of COPD, [place] in remission as of recently (see Dr. Ernestina Penna office note from 8/1), osteoporosis, comp fxs, and rib fx.  Patient presented to the ED with c/o chest pain and SOB.   Clinical Impression  Pt admitted with above diagnosis. Pt currently with functional limitations due to the deficits listed below (see PT Problem List). On eval, pt demo independence with bed mobility and transfers. +1 HH min guard assist provided for ambulation 150 feet. Pt ambulated on RA with desat to 81%. Pt instructed in pursed lip breathing. O2 increased to 88% with standing rest break. Pt placed on 2 L O2 after mobility with recovery to 90% after 2 minutes.  Pt will benefit from skilled PT to increase their independence and safety with mobility to allow discharge to the venue listed below.       Follow Up Recommendations No PT follow up;Supervision - Intermittent    Equipment Recommendations  None recommended by PT    Recommendations for Other Services       Precautions / Restrictions Precautions Precautions: Other (comment) Precaution Comments: watch O2 sats      Mobility  Bed Mobility Overal bed mobility: Independent                Transfers Overall transfer level: Independent Equipment used: None                Ambulation/Gait Ambulation/Gait assistance: Min guard Ambulation Distance (Feet): 150 Feet Assistive device: 1 person hand held assist Gait Pattern/deviations: Step-through pattern;Decreased stride length Gait velocity: decreased   General Gait Details: Pt ambulated on RA with desat to 81%.  Stairs            Wheelchair Mobility    Modified Rankin (Stroke Patients Only)       Balance Overall balance assessment: Needs assistance Sitting-balance  support: No upper extremity supported;Feet supported Sitting balance-Leahy Scale: Normal     Standing balance support: Single extremity supported;During functional activity Standing balance-Leahy Scale: Good                               Pertinent Vitals/Pain Pain Assessment: Faces Faces Pain Scale: Hurts little more Pain Location: low back Pain Descriptors / Indicators: Sore Pain Intervention(s): Monitored during session;Repositioned    Home Living Family/patient expects to be discharged to:: Private residence Living Arrangements: Children Available Help at Discharge: Family;Available PRN/intermittently Type of Home: House Home Access: Stairs to enter Entrance Stairs-Rails: Right Entrance Stairs-Number of Steps: 3 Home Layout: One level Home Equipment: Walker - 2 wheels      Prior Function Level of Independence: Independent               Hand Dominance   Dominant Hand: Right    Extremity/Trunk Assessment   Upper Extremity Assessment Upper Extremity Assessment: Defer to OT evaluation    Lower Extremity Assessment Lower Extremity Assessment: Overall WFL for tasks assessed    Cervical / Trunk Assessment Cervical / Trunk Assessment: Kyphotic  Communication   Communication: No difficulties  Cognition Arousal/Alertness: Awake/alert Behavior During Therapy: WFL for tasks assessed/performed Overall Cognitive Status: Within Functional Limits for tasks assessed  General Comments      Exercises     Assessment/Plan    PT Assessment Patient needs continued PT services  PT Problem List Decreased activity tolerance;Decreased mobility;Pain;Cardiopulmonary status limiting activity       PT Treatment Interventions Gait training;Stair training;Functional mobility training;Therapeutic activities;Therapeutic exercise;Balance training;Patient/family education    PT Goals (Current goals can be  found in the Care Plan section)  Acute Rehab PT Goals Patient Stated Goal: home PT Goal Formulation: With patient Time For Goal Achievement: 12/29/16 Potential to Achieve Goals: Good    Frequency Min 3X/week   Barriers to discharge        Co-evaluation               AM-PAC PT "6 Clicks" Daily Activity  Outcome Measure Difficulty turning over in bed (including adjusting bedclothes, sheets and blankets)?: None Difficulty moving from lying on back to sitting on the side of the bed? : None Difficulty sitting down on and standing up from a chair with arms (e.g., wheelchair, bedside commode, etc,.)?: None Help needed moving to and from a bed to chair (including a wheelchair)?: None Help needed walking in hospital room?: None Help needed climbing 3-5 steps with a railing? : A Little 6 Click Score: 23    End of Session Equipment Utilized During Treatment: Gait belt;Oxygen Activity Tolerance: Patient tolerated treatment well Patient left: in bed;with call bell/phone within reach Nurse Communication: Mobility status PT Visit Diagnosis: Difficulty in walking, not elsewhere classified (R26.2);Pain    Time: 4742-5956 PT Time Calculation (min) (ACUTE ONLY): 16 min   Charges:   PT Evaluation $PT Eval Low Complexity: 1 Low     PT G Codes:        Lorrin Goodell, PT  Office # [phone] Pager [phone]   Lorriane Shire 12/22/2016, 10:09 AM

## 2016-12-22 NOTE — Discharge Instructions (Addendum)
Chronic Obstructive Pulmonary Disease Exacerbation Chronic obstructive pulmonary disease (COPD) is a common lung problem. In COPD, the flow of air from the lungs is limited. COPD exacerbations are times that breathing gets worse and you need extra treatment. Without treatment they can be life threatening. If they happen often, your lungs can become more damaged. If your COPD gets worse, your doctor may treat you with:  Medicines.  Oxygen.  Different ways to clear your airway, such as using a mask.  Follow these instructions at home:  Do not smoke.  Avoid tobacco smoke and other things that bother your lungs.  If given, take your antibiotic medicine as told. Finish the medicine even if you start to feel better.  Only take medicines as told by your doctor.  Drink enough fluids to keep your pee (urine) clear or pale yellow (unless your doctor has told you not to).  Use a cool mist machine (vaporizer).  If you use oxygen or a machine that turns liquid medicine into a mist (nebulizer), continue to use them as told.  Keep up with shots (vaccinations) as told by your doctor.  Exercise regularly.  Eat healthy foods.  Keep all doctor visits as told. Get help right away if:  You are very short of breath and it gets worse.  You have trouble talking.  You have bad chest pain.  You have blood in your spit (sputum).  You have a fever.  You keep throwing up (vomiting).  You feel weak, or you pass out (faint).  You feel confused.  You keep getting worse. This information is not intended to replace advice given to you by your health care provider. Make sure you discuss any questions you have with your health care provider. Document Released: 02/24/2011 Document Revised: 08/13/2015 Document Reviewed: 11/09/2012 Elsevier Interactive Patient Education  2017 [facility] Stay Proper nutrition can help your body recover from illness and injury.   Foods  and beverages high in protein, vitamins, and minerals help rebuild muscle loss, promote healing, & reduce fall risk.   In addition to eating healthy foods, a nutrition shake is an easy, delicious way to get the nutrition you need during and after your hospital stay  It is recommended that you continue to drink 2 bottles per day of:       Ensure Enlive for at least 1 month (30 days) after your hospital stay   Tips for adding a nutrition shake into your routine: As allowed, drink one with vitamins or medications instead of water or juice Enjoy one as a tasty mid-morning or afternoon snack Drink cold or make a milkshake out of it Drink one instead of milk with cereal or snacks Use as a coffee creamer   Available at the following grocery stores and pharmacies:           * [place] [phone]            For COUPONS visit: www.ensure.com/join or http://dawson-may.com/   Suggested Substitutions Ensure Plus = Boost Plus = Carnation Breakfast Essentials = Boost Compact Ensure Active Clear = Boost Breeze Glucerna Shake = Boost Glucose Control = Carnation Breakfast Essentials SUGAR FREE

## 2016-12-22 NOTE — Evaluation (Addendum)
Occupational Therapy Evaluation and Discharge Patient Details Name: Tina Bailey MRN: [id number] DOB: 02/24/1946 Today's Date: 12/22/2016    History of Present Illness Pt is a 71 y.o. female with medical history significant of COPD, [place] in remission as of recently (see Dr. Ernestina Penna office note from 8/1), osteoporosis, comp fxs, and rib fx.  Patient presented to the ED with c/o chest pain and SOB.    Clinical Impression   PTA Pt independent in ADL and mobility. Pt is currently mod I for ADL and supervision for in room ambulation without DME. Pt and OT talked about safety in the home and shower - Pt has a shower chair that she uses. No questions or concerns for OT at the end of session, education complete and OT to sign off at this time. Thank you for the opportunity to serve this patient.    Follow Up Recommendations  No OT follow up    Equipment Recommendations  None recommended by OT (Pt has appropriate DME)    Recommendations for Other Services       Precautions / Restrictions Precautions Precautions: Other (comment) Precaution Comments: watch O2 sats Restrictions Weight Bearing Restrictions: No      Mobility Bed Mobility Overal bed mobility: Independent                Transfers Overall transfer level: Independent Equipment used: None                  Balance Overall balance assessment: Needs assistance Sitting-balance support: No upper extremity supported;Feet supported Sitting balance-Leahy Scale: Normal Sitting balance - Comments: able to perform LB dressing sitting EOB   Standing balance support: No upper extremity supported;During functional activity Standing balance-Leahy Scale: Good Standing balance comment: sink level activities                           ADL either performed or assessed with clinical judgement   ADL Overall ADL's : Independent;At baseline                                       General ADL  Comments: Pt able to perform toilet transfer, sink level grooming (oral care including opening containers), don/doff socks with no LOB     Vision Baseline Vision/History: Wears glasses Wears Glasses: At all times Patient Visual Report: No change from baseline       Perception     Praxis      Pertinent Vitals/Pain Pain Assessment: Faces Faces Pain Scale: Hurts whole lot Pain Location: low back and left side Pain Descriptors / Indicators: Sore;Grimacing;Discomfort Pain Intervention(s): Monitored during session;Repositioned     Hand Dominance Right   Extremity/Trunk Assessment Upper Extremity Assessment Upper Extremity Assessment: Overall WFL for tasks assessed   Lower Extremity Assessment Lower Extremity Assessment: Overall WFL for tasks assessed   Cervical / Trunk Assessment Cervical / Trunk Assessment: Kyphotic   Communication Communication Communication: No difficulties   Cognition Arousal/Alertness: Awake/alert Behavior During Therapy: WFL for tasks assessed/performed Overall Cognitive Status: Within Functional Limits for tasks assessed                                     General Comments  Suggested joining a social group as Pt shared that she misses social interaction  Exercises     Shoulder Instructions      Home Living Family/patient expects to be discharged to:: Private residence Living Arrangements: Children Available Help at Discharge: Family;Available PRN/intermittently Type of Home: House Home Access: Stairs to enter Entrance Stairs-Number of Steps: 3 Entrance Stairs-Rails: Right Home Layout: One level         Bathroom Toilet: Standard Bathroom Accessibility: Yes   Home Equipment: Walker - 2 wheels          Prior Functioning/Environment Level of Independence: Independent                 OT Problem List:        OT Treatment/Interventions:      OT Goals(Current goals can be found in the care plan section)  Acute Rehab OT Goals Patient Stated Goal: to get home and get out more OT Goal Formulation: With patient Time For Goal Achievement: 01/05/17 Potential to Achieve Goals: Good  OT Frequency:     Barriers to D/C:            Co-evaluation              AM-PAC PT "6 Clicks" Daily Activity     Outcome Measure Help from another person eating meals?: None Help from another person taking care of personal grooming?: None Help from another person toileting, which includes using toliet, bedpan, or urinal?: None Help from another person bathing (including washing, rinsing, drying)?: None Help from another person to put on and taking off regular upper body clothing?: None Help from another person to put on and taking off regular lower body clothing?: None 6 Click Score: 24   End of Session Equipment Utilized During Treatment: Oxygen (1L) Nurse Communication: Mobility status  Activity Tolerance: Patient tolerated treatment well Patient left: in chair;with call bell/phone within reach                   Time: 1114-1135 OT Time Calculation (min): 21 min Charges:  OT General Charges $OT Visit: 1 Visit OT Evaluation $OT Eval Low Complexity: 1 Low G-Codes:     Hulda Humphrey OTR/L [place] 01/06/2017, 11:38 AM   Add G Codes:    January 06, 2017 1100  OT G-codes **NOT FOR INPATIENT CLASS**  Functional Assessment Tool Used AM-PAC 6 Clicks Daily Activity  Functional Limitation Self care  Self Care Current Status (O0321) [facility]  Self Care Goal Status (Y2482) [facility]  Self Care Discharge Status [phone]) [place] OTR/L [phone]

## 2016-12-22 NOTE — Care Management Obs Status (Signed)
[place] NOTIFICATION   Patient Details  Name: Tina Bailey MRN: [id number] Date of Birth: Feb 16, 1946   Medicare Observation Status Notification Given:       Adron Bene, RN 12/22/2016, 1:23 PM

## 2016-12-23 ENCOUNTER — Telehealth: Payer: Self-pay

## 2016-12-23 NOTE — Telephone Encounter (Signed)
Transition Care Management Follow-up Telephone Call   Date discharged? 12/22/16   How have you been since you were released from the hospital? "still having pain in torso/lower back"   Do you understand why you were in the hospital? yes   Do you understand the discharge instructions? yes   Where were you discharged to? Home, daughter with patient.    Items Reviewed:  Medications reviewed: yes  Allergies reviewed: yes  Dietary changes reviewed: yes  Referrals reviewed: yes   Functional Questionnaire:   Activities of Daily Living (ADLs):   She states they are independent in the following: ambulation, bathing and hygiene, feeding, continence, grooming, toileting and dressing States they require assistance with the following: None   Any transportation issues/concerns?: no   Any patient concerns? no   Confirmed importance and date/time of follow-up visits scheduled yes  Provider Appointment booked with Elyn Aquas, PA-C on Thursday, 12/29/16 @ 11 am.   Confirmed with patient if condition begins to worsen call PCP or go to the ER.  Patient was given the office number and encouraged to call back with question or concerns.  : yes

## 2016-12-28 ENCOUNTER — Other Ambulatory Visit: Payer: Self-pay | Admitting: General Practice

## 2016-12-28 MED ORDER — CITALOPRAM HYDROBROMIDE 10 MG PO TABS: 10.0000 mg | ORAL_TABLET | Freq: Every day | ORAL | 0 refills | Status: DC

## 2016-12-29 ENCOUNTER — Ambulatory Visit (INDEPENDENT_AMBULATORY_CARE_PROVIDER_SITE_OTHER): Payer: PPO | Admitting: Physician Assistant

## 2016-12-29 ENCOUNTER — Encounter: Payer: Self-pay | Admitting: Physician Assistant

## 2016-12-29 VITALS — BP 120/72 | HR 72 | Temp 98.2°F | Resp 14 | Ht 66.0 in | Wt 103.0 lb

## 2016-12-29 DIAGNOSIS — R300 Dysuria: Secondary | ICD-10-CM | POA: Diagnosis not present

## 2016-12-29 DIAGNOSIS — M4698 Unspecified inflammatory spondylopathy, sacral and sacrococcygeal region: Secondary | ICD-10-CM | POA: Diagnosis not present

## 2016-12-29 DIAGNOSIS — M47818 Spondylosis without myelopathy or radiculopathy, sacral and sacrococcygeal region: Secondary | ICD-10-CM

## 2016-12-29 DIAGNOSIS — J9601 Acute respiratory failure with hypoxia: Secondary | ICD-10-CM | POA: Diagnosis not present

## 2016-12-29 LAB — POCT URINALYSIS DIPSTICK
Bilirubin, UA: NEGATIVE
Blood, UA: NEGATIVE
Glucose, UA: NEGATIVE
Ketones, UA: NEGATIVE
Leukocytes, UA: NEGATIVE
Nitrite, UA: NEGATIVE
Protein, UA: NEGATIVE
Spec Grav, UA: 1.01 (ref 1.010–1.025)
Urobilinogen, UA: 0.2 E.U./dL
pH, UA: 7 (ref 5.0–8.0)

## 2016-12-29 NOTE — Progress Notes (Signed)
Patient presents to clinic today for hospital follow-up. Patient presented to [facility] ER on 12/21/2016 with c/o cough, SOB and R sided chest pain. Was given neb treatments in ER with improvement in breathing. Still required O2 with ambulation. Patient subsequently admitted to the hospital for further assessment and management. Patient was started on Ceftriaxone and Azithromycin. Was placed on steroid burst on discharge and kept on O2 at 2L/min on discharge.   Since discharge, patient endorses doing well overall. Notes some continued pleurisy. Denies SOB. Is taking medications as directed. Denies fever, chills, malaise. Does have chronic lower back/hip pain, this is unchanged per patient. Is wearing O2 and is saturating well at home. Is in need of OP Pulmonary follow-up.   Patient does note some mild urinary frequency and urgency with occasional dysuria. No symptoms presently.  Past Medical History:  Diagnosis Date  . Arthritis    DDD. Right shoulder"is frozen"-limited ROM. osteoporosis.  . 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 Claudia Pollock evaluating  . Cancer ([place]) 12/2014   liver cancer  . Complication of anesthesia   . COPD (chronic obstructive pulmonary disease) ([place])   . Fibromyalgia   . Fracture of rib of right side    hx "osteoporosis"- states her dog nudge her on the side, next day developed great pain and was told has a fracture rib.  . Macular degeneration    R eye  . Osteoporosis   . PONV (postoperative nausea and vomiting)    nausea,severe vomiting after 01-13-15 portal vein embolization  . Productive cough   . Retina disorder    L eye, vision distorted, edema  . Spine fracture due to birth trauma   . TMJ disease   . Wears glasses     Current Outpatient Prescriptions on File Prior to Visit  Medication Sig Dispense Refill  . albuterol (PROVENTIL HFA;VENTOLIN HFA) 108 (90 Base) MCG/ACT inhaler Inhale 2 puffs into the lungs every 6 (six) hours as needed for wheezing or  shortness of breath. 1 Inhaler 2  . budesonide-formoterol (SYMBICORT) 160-4.5 MCG/ACT inhaler Inhale 2 puffs into the lungs 2 (two) times daily. 1 Inhaler 0  . Calcium Carbonate-Vitamin D (CALCIUM 500+D HIGH POTENCY PO) Take 1 tablet by mouth daily.     . Cholecalciferol (VITAMIN D3) 5000 units CAPS Take 5,000 Units by mouth daily.    . citalopram (CELEXA) 10 MG tablet Take 1 tablet (10 mg total) by mouth daily. 90 tablet 0  . Cyanocobalamin (VITAMIN B-12) 1000 MCG SUBL Place 1,000 mcg under the tongue daily.     . feeding supplement, ENSURE ENLIVE, (ENSURE ENLIVE) LIQD Take 237 mLs by mouth 2 (two) times daily between meals. 237 mL 12  . guaiFENesin (MUCINEX) 600 MG 12 hr tablet Take 2 tablets (1,200 mg total) by mouth 2 (two) times daily. 30 tablet 0  . HYDROcodone-acetaminophen (NORCO/VICODIN) 5-325 MG tablet Take 1 tablet by mouth 3 (three) times daily.     Marland Kitchen ibuprofen (ADVIL,MOTRIN) 200 MG tablet Take 200 mg by mouth every 6 (six) hours as needed (for pain or headaches).    Marland Kitchen ipratropium-albuterol (DUONEB) 0.5-2.5 (3) MG/3ML SOLN Take 3 mLs by nebulization every 4 (four) hours as needed (Wheezing or shortness of breath). 360 mL 0  . Multiple Vitamins-Minerals (CENTRUM SILVER 50+WOMEN) TABS Take 1 tablet by mouth daily.    Marland Kitchen OVER THE COUNTER MEDICATION TriVita (Amino acids complex): Mix 1 teaspoonful into 8 ounces of water and drink two times a week  No current facility-administered medications on file prior to visit.     Allergies  Allergen Reactions  . Amoxicillin-Pot Clavulanate Nausea Only    Has patient had a PCN reaction causing immediate rash, facial/tongue/throat swelling, SOB or lightheadedness with hypotension: No Has patient had a PCN reaction causing severe rash involving mucus membranes or skin necrosis: No Has patient had a PCN reaction that required hospitalization No Has patient had a PCN reaction occurring within the last 10 years: No If all of the above answers are  "NO", then may proceed with Cephalosporin use.   . Clindamycin/Lincomycin Other (See Comments)    Felt like it "burned out" her stomach/took a large dose  . Paroxetine Hcl Rash  . Sulfa Antibiotics Rash    Family History  Problem Relation Age of Onset  . Hypertension Mother   . Atrial fibrillation Mother   . Heart disease Mother        after age 15  . Stroke Father   . Dementia Father   . Heart disease Brother        After age 52- A-Fib  . Heart attack Brother     Social History   Social History  . Marital status: Divorced    Spouse name: N/A  . Number of children: N/A  . Years of education: N/A   Social History Main Topics  . Smoking status: Former Smoker    Packs/day: 1.00    Years: 45.00    Types: Cigarettes    Quit date: 10/23/2016  . Smokeless tobacco: Never Used  . Alcohol use No  . Drug use: No  . Sexual activity: Not Asked   Other Topics Concern  . None   Social History Narrative  . None   Review of Systems - See HPI.  All other ROS are negative.  BP 120/72   Pulse 72   Temp 98.2 F (36.8 C) (Oral)   Resp 14   Ht _0  (1.676 m)   Wt 103 lb (46.7 kg)   SpO2 98% Comment: 2L O2  BMI 16.62 kg/m   Physical Exam  Constitutional: She is oriented to person, place, and time and well-developed, well-nourished, and in no distress.  HENT:  Head: Normocephalic and atraumatic.  Right Ear: External ear normal.  Left Ear: External ear normal.  Nose: Nose normal.  Mouth/Throat: Oropharynx is clear and moist. No oropharyngeal exudate.  TM within normal limits bilaterally.  Eyes: Conjunctivae are normal.  Cardiovascular: Normal rate, regular rhythm, normal heart sounds and intact distal pulses.   Pulmonary/Chest: Effort normal and breath sounds normal. No respiratory distress. She has no wheezes. She has no rales. She exhibits no tenderness.  Neurological: She is alert and oriented to person, place, and time.  Skin: Skin is warm and dry. No rash noted.    Vitals reviewed.  Recent Results (from the past 2160 hour(s))  I-STAT creatinine     Status: None   Collection Time: 10/14/16  9:35 AM  Result Value Ref Range   Creatinine, Ser 0.50 0.44 - 1.00 mg/dL  CBC with Differential     Status: None   Collection Time: 10/19/16 11:27 AM  Result Value Ref Range   WBC 6.1 3.9 - 10.3 10e3/uL   NEUT# 3.7 1.5 - 6.5 10e3/uL   HGB 15.0 11.6 - 15.9 g/dL   HCT 45.2 34.8 - 46.6 %   Platelets 193 145 - 400 10e3/uL   MCV 92.7 79.5 - 101.0 fL   MCH 30.8 25.1 -  34.0 pg   MCHC 33.3 31.5 - 36.0 g/dL   RBC 4.87 3.70 - 5.45 10e6/uL   RDW 13.4 11.2 - 14.5 %   lymph# 1.8 0.9 - 3.3 10e3/uL   MONO# 0.5 0.1 - 0.9 10e3/uL   Eosinophils Absolute 0.1 0.0 - 0.5 10e3/uL   Basophils Absolute 0.1 0.0 - 0.1 10e3/uL   NEUT% 60.0 38.4 - 76.8 %   LYMPH% 29.5 14.0 - 49.7 %   MONO% 8.5 0.0 - 14.0 %   EOS% 1.2 0.0 - 7.0 %   BASO% 0.8 0.0 - 2.0 %  Comprehensive metabolic panel     Status: Abnormal   Collection Time: 10/19/16 11:27 AM  Result Value Ref Range   Sodium 133 (L) 136 - 145 mEq/L   Potassium 4.6 3.5 - 5.1 mEq/L   Chloride 94 (L) 98 - 109 mEq/L   CO2 33 (H) 22 - 29 mEq/L   Glucose 98 70 - 140 mg/dl    Comment: Glucose reference range is for nonfasting patients. Fasting glucose reference range is 70- 100.   BUN 11.6 7.0 - 26.0 mg/dL   Creatinine 0.7 0.6 - 1.1 mg/dL   Total Bilirubin 0.61 0.20 - 1.20 mg/dL   Alkaline Phosphatase 79 40 - 150 U/L   AST 24 5 - 34 U/L   ALT 23 0 - 55 U/L   Total Protein 6.9 6.4 - 8.3 g/dL   Albumin 4.0 3.5 - 5.0 g/dL   Calcium 9.8 8.4 - 10.4 mg/dL   Anion Gap 6 3 - 11 mEq/L   EGFR 84 (L) >90 ml/min/1.73 m2    Comment: eGFR is calculated using the CKD-EPI Creatinine Equation (2009)  AFP tumor marker     Status: None   Collection Time: 10/19/16 11:27 AM  Result Value Ref Range   AFP, Serum, Tumor Marker 3.2 0.0 - 8.3 ng/mL    Comment: Roche ECLIA methodology  Basic metabolic panel     Status: Abnormal   Collection Time:  12/21/16  4:12 PM  Result Value Ref Range   Sodium 132 (L) 135 - 145 mmol/L   Potassium 4.2 3.5 - 5.1 mmol/L   Chloride 94 (L) 101 - 111 mmol/L   CO2 29 22 - 32 mmol/L   Glucose, Bld 116 (H) 65 - 99 mg/dL   BUN 12 6 - 20 mg/dL   Creatinine, Ser 0.57 0.44 - 1.00 mg/dL   Calcium 9.0 8.9 - 10.3 mg/dL   GFR calc non Af Amer >60 >60 mL/min   GFR calc Af Amer >60 >60 mL/min    Comment: (NOTE) The eGFR has been calculated using the CKD EPI equation. This calculation has not been validated in all clinical situations. eGFR's persistently <60 mL/min signify possible Chronic Kidney Disease.    Anion gap 9 5 - 15  CBC     Status: None   Collection Time: 12/21/16  4:12 PM  Result Value Ref Range   WBC 10.2 4.0 - 10.5 K/uL   RBC 4.48 3.87 - 5.11 MIL/uL   Hemoglobin 13.7 12.0 - 15.0 g/dL   HCT 42.1 36.0 - 46.0 %   MCV 94.0 78.0 - 100.0 fL   MCH 30.6 26.0 - 34.0 pg   MCHC 32.5 30.0 - 36.0 g/dL   RDW 13.2 11.5 - 15.5 %   Platelets 238 150 - 400 K/uL  I-stat troponin, ED     Status: None   Collection Time: 12/21/16  4:30 PM  Result Value Ref Range  Troponin i, poc 0.01 0.00 - 0.08 ng/mL   Comment 3            Comment: Due to the release kinetics of cTnI, a negative result within the first hours of the onset of symptoms does not rule out myocardial infarction with certainty. If myocardial infarction is still suspected, repeat the test at appropriate intervals.   Basic metabolic panel     Status: Abnormal   Collection Time: 12/22/16  4:46 AM  Result Value Ref Range   Sodium 133 (L) 135 - 145 mmol/L   Potassium 3.4 (L) 3.5 - 5.1 mmol/L    Comment: DELTA CHECK NOTED   Chloride 91 (L) 101 - 111 mmol/L   CO2 28 22 - 32 mmol/L   Glucose, Bld 208 (H) 65 - 99 mg/dL   BUN 15 6 - 20 mg/dL   Creatinine, Ser 0.73 0.44 - 1.00 mg/dL   Calcium 9.1 8.9 - 10.3 mg/dL   GFR calc non Af Amer >60 >60 mL/min   GFR calc Af Amer >60 >60 mL/min    Comment: (NOTE) The eGFR has been calculated using the  CKD EPI equation. This calculation has not been validated in all clinical situations. eGFR's persistently <60 mL/min signify possible Chronic Kidney Disease.    Anion gap 14 5 - 15  Hepatic function panel     Status: Abnormal   Collection Time: 12/22/16  4:46 AM  Result Value Ref Range   Total Protein 6.9 6.5 - 8.1 g/dL   Albumin 3.8 3.5 - 5.0 g/dL   AST 48 (H) 15 - 41 U/L   ALT 35 14 - 54 U/L   Alkaline Phosphatase 87 38 - 126 U/L   Total Bilirubin 0.5 0.3 - 1.2 mg/dL   Bilirubin, Direct <0.1 (L) 0.1 - 0.5 mg/dL   Indirect Bilirubin NOT CALCULATED 0.3 - 0.9 mg/dL  POCT Urinalysis Dipstick     Status: Normal   Collection Time: 12/29/16 12:43 PM  Result Value Ref Range   Color, UA yellow    Clarity, UA clear    Glucose, UA negative    Bilirubin, UA negative    Ketones, UA negative    Spec Grav, UA 1.010 1.010 - 1.025   Blood, UA negative    pH, UA 7.0 5.0 - 8.0   Protein, UA negative    Urobilinogen, UA 0.2 0.2 or 1.0 E.U./dL   Nitrite, UA negative    Leukocytes, UA Negative Negative  Urine Culture     Status: None   Collection Time: 12/29/16 12:44 PM  Result Value Ref Range   MICRO NUMBER: [id number]    SPECIMEN QUALITY: ADEQUATE    Sample Source URINE    STATUS: FINAL    Result: No Growth   Basic Metabolic Panel (BMET)     Status: Abnormal   Collection Time: 12/29/16  3:23 PM  Result Value Ref Range   Glucose, Bld 102 (H) 65 - 99 mg/dL    Comment: .            Fasting reference interval . For someone without known diabetes, a glucose value between 100 and 125 mg/dL is consistent with prediabetes and should be confirmed with a follow-up test. .    BUN 14 7 - 25 mg/dL   Creat 0.59 (L) 0.60 - 0.93 mg/dL    Comment: For patients >90 years of age, the reference limit for Creatinine is approximately 13% higher for people identified as African-American. Marland Kitchen  BUN/Creatinine Ratio 24 (H) 6 - 22 (calc)   Sodium 134 (L) 135 - 146 mmol/L   Potassium 4.4 3.5 - 5.3  mmol/L   Chloride 94 (L) 98 - 110 mmol/L   CO2 31 20 - 32 mmol/L   Calcium 9.4 8.6 - 10.4 mg/dL  CBC     Status: Abnormal   Collection Time: 12/29/16  3:23 PM  Result Value Ref Range   WBC 12.2 (H) 3.8 - 10.8 Thousand/uL   RBC 4.45 3.80 - 5.10 Million/uL   Hemoglobin 13.7 11.7 - 15.5 g/dL   HCT 40.6 35.0 - 45.0 %   MCV 91.2 80.0 - 100.0 fL   MCH 30.8 27.0 - 33.0 pg   MCHC 33.7 32.0 - 36.0 g/dL   RDW 11.9 11.0 - 15.0 %   Platelets 304 140 - 400 Thousand/uL   MPV 9.9 7.5 - 12.5 fL  C-reactive protein     Status: None   Collection Time: 12/29/16  3:23 PM  Result Value Ref Range   CRP 2.0 <8.0 mg/L   Assessment/Plan: 1. Acute respiratory failure with hypoxia (HCC) S/P antibiotics. Some residual pleurisy. Repeat labs today. Will start steroid taper to help. Referral to Pulmonology placed.  - Basic Metabolic Panel (BMET) - CBC - C-reactive protein  2. SI joint arthritis (HCC) Chronic. Will obtain CRP. - Basic Metabolic Panel (BMET) - CBC - C-reactive protein  3. Dysuria Urine dip unremarkable for infection. Will send for culture. Giving history of waxing and waning symptoms over the past year, question interstitial cystitis. Handout given on trigger foods. Will refer to Urology. Culture obtained. Will alter regimen based on culture results.  - POCT Urinalysis Dipstick - Urine Culture - Basic Metabolic Panel (BMET) - CBC - C-reactive protein   Leeanne Rio, PA-C

## 2016-12-29 NOTE — Patient Instructions (Addendum)
Please go to the lab for blood work. I will call you with your results. I am setting you up with Pulmonary. I am sending in a taper of steroid to help with pleurisy. Continue the Symbicort as directed. No need for neb treatments.   I am checking urine today. If negative I want you to see a Urologist as I feel you may have interstitial cystitis.   Follow-up with me in 2 weeks.     Interstitial Cystitis Interstitial cystitis is a condition that causes inflammation of the bladder. The bladder is a hollow organ in the lower part of your abdomen. It stores urine after the urine is made by your kidneys. With interstitial cystitis, you may have pain in the bladder area. You may also have a frequent and urgent need to urinate. The severity of interstitial cystitis can vary from person to person. You may have flare-ups of the condition, and then it may go away for a while. For many people who have this condition, it becomes a long-term problem. What are the causes? The cause of this condition is not known. What increases the risk? This condition is more likely to develop in women. What are the signs or symptoms? Symptoms of interstitial cystitis vary, and they can change over time. Symptoms may include:  Discomfort or pain in the bladder area. This can range from mild to severe. The pain may change in intensity as the bladder fills with urine or as it empties.  Pelvic pain.  An urgent need to urinate.  Frequent urination.  Pain during sexual intercourse.  Pinpoint bleeding on the bladder wall.  For women, the symptoms often get worse during menstruation. How is this diagnosed? This condition is diagnosed by evaluating your symptoms and ruling out other causes. A physical exam will be done. Various tests may be done to rule out other conditions. Common tests include:  Urine tests.  Cystoscopy. In this test, a tool that is like a very thin telescope is used to look into your  bladder.  Biopsy. This involves taking a sample of tissue from the bladder wall to be examined under a microscope.  How is this treated? There is no cure for interstitial cystitis, but treatment methods are available to control your symptoms. Work closely with your health care provider to find the treatments that will be most effective for you. Treatment options may include:  Medicines to relieve pain and to help reduce the number of times that you feel the need to urinate.  Bladder training. This involves learning ways to control when you urinate, such as: ? Urinating at scheduled times. ? Training yourself to delay urination. ? Doing exercises (Kegel exercises) to strengthen the muscles that control urine flow.  Lifestyle changes, such as changing your diet or taking steps to control stress.  Use of a device that provides electrical stimulation in order to reduce pain.  A procedure that stretches your bladder by filling it with air or fluid.  Surgery. This is rare. It is only done for extreme cases if other treatments do not help.  Follow these instructions at home:  Take medicines only as directed by your health care provider.  Use bladder training techniques as directed. ? Keep a bladder diary to find out which foods, liquids, or activities make your symptoms worse. ? Use your bladder diary to schedule bathroom trips. If you are away from home, plan to be near a bathroom at each of your scheduled times. ? Make sure  you urinate just before you leave the house and just before you go to bed.  Do Kegel exercises as directed by your health care provider.  Do not drink alcohol.  Do not use any tobacco products, including cigarettes, chewing tobacco, or electronic cigarettes. If you need help quitting, ask your health care provider.  Make dietary changes as directed by your health care provider. You may need to avoid spicy foods and foods that contain a high amount of  potassium.  Limit your drinking of beverages that stimulate urination. These include soda, coffee, and tea.  Keep all follow-up visits as directed by your health care provider. This is important. Contact a health care provider if:  Your symptoms do not get better after treatment.  Your pain and discomfort are getting worse.  You have more frequent urges to urinate.  You have a fever. Get help right away if:  You are not able to control your bladder at all. This information is not intended to replace advice given to you by your health care provider. Make sure you discuss any questions you have with your health care provider. Document Released: 11/06/2003 Document Revised: 08/13/2015 Document Reviewed: 11/12/2013 Elsevier Interactive Patient Education  [employer].

## 2016-12-29 NOTE — Progress Notes (Signed)
Pre visit review using our clinic review tool, if applicable. No additional management support is needed unless otherwise documented below in the visit note. 

## 2016-12-30 LAB — URINE CULTURE
MICRO NUMBER:: 81134865
Result:: NO GROWTH
SPECIMEN QUALITY:: ADEQUATE

## 2016-12-30 LAB — CBC
HCT: 40.6 % (ref 35.0–45.0)
Hemoglobin: 13.7 g/dL (ref 11.7–15.5)
MCH: 30.8 pg (ref 27.0–33.0)
MCHC: 33.7 g/dL (ref 32.0–36.0)
MCV: 91.2 fL (ref 80.0–100.0)
MPV: 9.9 fL (ref 7.5–12.5)
Platelets: 304 10*3/uL (ref 140–400)
RBC: 4.45 10*6/uL (ref 3.80–5.10)
RDW: 11.9 % (ref 11.0–15.0)
WBC: 12.2 10*3/uL — ABNORMAL HIGH (ref 3.8–10.8)

## 2016-12-30 LAB — BASIC METABOLIC PANEL
BUN/Creatinine Ratio: 24 (calc) — ABNORMAL HIGH (ref 6–22)
BUN: 14 mg/dL (ref 7–25)
CO2: 31 mmol/L (ref 20–32)
Calcium: 9.4 mg/dL (ref 8.6–10.4)
Chloride: 94 mmol/L — ABNORMAL LOW (ref 98–110)
Creat: 0.59 mg/dL — ABNORMAL LOW (ref 0.60–0.93)
Glucose, Bld: 102 mg/dL — ABNORMAL HIGH (ref 65–99)
Potassium: 4.4 mmol/L (ref 3.5–5.3)
Sodium: 134 mmol/L — ABNORMAL LOW (ref 135–146)

## 2016-12-30 LAB — C-REACTIVE PROTEIN: CRP: 2 mg/L (ref <8.0)

## 2016-12-30 NOTE — Progress Notes (Signed)
PT Evaluation Addendum Late entry for missed G-code on 12/22/16  Based on review of documentation and goals    12/22/16 1008  PT Time Calculation  PT Start Time (ACUTE ONLY) 0927  PT Stop Time (ACUTE ONLY) 0943  PT Time Calculation (min) (ACUTE ONLY) 16 min  PT G-Codes **NOT FOR INPATIENT CLASS**  Functional Assessment Tool Used AM-PAC 6 Clicks Basic Mobility  Functional Limitation Mobility: Walking and moving around  Mobility: Walking and Moving Around Current Status (U9811) CI  Mobility: Walking and Moving Around Goal Status (B1478) CI  PT General Charges  $$ ACUTE PT VISIT 1 Visit  PT Evaluation  $PT Eval Low Complexity 1 Low  12/30/2016 [place], [place] [phone]

## 2017-01-02 ENCOUNTER — Telehealth: Payer: Self-pay | Admitting: Family Medicine

## 2017-01-02 NOTE — Telephone Encounter (Signed)
Stop the Nicoderm patches that she is using. Likely a local reaction to the skin.  I do not feel she would be a good candidate for Chantix or Wellbutrin giving history of anxiety/depression.  Has she ever tried gum or other OTC measures?

## 2017-01-02 NOTE — Telephone Encounter (Signed)
Pt notified and will use the gum she has at home.

## 2017-01-02 NOTE — Telephone Encounter (Signed)
Please advise 

## 2017-01-02 NOTE — Telephone Encounter (Signed)
Pt states that she is starting to break out from the patched to help her stop smoking. Pt asking if she should stop using them, please advise.

## 2017-01-09 ENCOUNTER — Ambulatory Visit (INDEPENDENT_AMBULATORY_CARE_PROVIDER_SITE_OTHER): Payer: PPO | Admitting: Family Medicine

## 2017-01-09 ENCOUNTER — Encounter: Payer: Self-pay | Admitting: Family Medicine

## 2017-01-09 ENCOUNTER — Encounter: Payer: Self-pay | Admitting: General Practice

## 2017-01-09 VITALS — BP 122/84 | HR 74 | Temp 98.0°F | Resp 16 | Ht 66.0 in | Wt 103.2 lb

## 2017-01-09 DIAGNOSIS — J9601 Acute respiratory failure with hypoxia: Secondary | ICD-10-CM

## 2017-01-09 DIAGNOSIS — D72829 Elevated white blood cell count, unspecified: Secondary | ICD-10-CM | POA: Diagnosis not present

## 2017-01-09 DIAGNOSIS — Z0000 Encounter for general adult medical examination without abnormal findings: Secondary | ICD-10-CM | POA: Insufficient documentation

## 2017-01-09 DIAGNOSIS — M8000XG Age-related osteoporosis with current pathological fracture, unspecified site, subsequent encounter for fracture with delayed healing: Secondary | ICD-10-CM

## 2017-01-09 DIAGNOSIS — Z1231 Encounter for screening mammogram for malignant neoplasm of breast: Secondary | ICD-10-CM | POA: Diagnosis not present

## 2017-01-09 DIAGNOSIS — C220 Liver cell carcinoma: Secondary | ICD-10-CM

## 2017-01-09 DIAGNOSIS — E440 Moderate protein-calorie malnutrition: Secondary | ICD-10-CM | POA: Diagnosis not present

## 2017-01-09 LAB — LIPID PANEL
Cholesterol: 188 mg/dL (ref 0–200)
HDL: 91.4 mg/dL (ref >39.00)
LDL Cholesterol: 82 mg/dL (ref 0–99)
NonHDL: 96.32
Total CHOL/HDL Ratio: 2
Triglycerides: 71 mg/dL (ref 0.0–149.0)
VLDL: 14.2 mg/dL (ref 0.0–40.0)

## 2017-01-09 LAB — CBC WITH DIFFERENTIAL/PLATELET
Basophils Absolute: 0.1 10*3/uL (ref 0.0–0.1)
Basophils Relative: 0.8 % (ref 0.0–3.0)
Eosinophils Absolute: 0.1 10*3/uL (ref 0.0–0.7)
Eosinophils Relative: 1 % (ref 0.0–5.0)
HCT: 41 % (ref 36.0–46.0)
Hemoglobin: 13.6 g/dL (ref 12.0–15.0)
Lymphocytes Relative: 32.9 % (ref 12.0–46.0)
Lymphs Abs: 2.1 10*3/uL (ref 0.7–4.0)
MCHC: 33.2 g/dL (ref 30.0–36.0)
MCV: 94.4 fl (ref 78.0–100.0)
Monocytes Absolute: 0.5 10*3/uL (ref 0.1–1.0)
Monocytes Relative: 8.3 % (ref 3.0–12.0)
Neutro Abs: 3.6 10*3/uL (ref 1.4–7.7)
Neutrophils Relative %: 57 % (ref 43.0–77.0)
Platelets: 260 10*3/uL (ref 150.0–400.0)
RBC: 4.34 Mil/uL (ref 3.87–5.11)
RDW: 13.3 % (ref 11.5–15.5)
WBC: 6.3 10*3/uL (ref 4.0–10.5)

## 2017-01-09 LAB — VITAMIN D 25 HYDROXY (VIT D DEFICIENCY, FRACTURES): VITD: 35.03 ng/mL (ref 30.00–100.00)

## 2017-01-09 LAB — HEPATIC FUNCTION PANEL
ALT: 19 U/L (ref 0–35)
AST: 22 U/L (ref 0–37)
Albumin: 4.3 g/dL (ref 3.5–5.2)
Alkaline Phosphatase: 112 U/L (ref 39–117)
Bilirubin, Direct: 0.2 mg/dL (ref 0.0–0.3)
Total Bilirubin: 0.6 mg/dL (ref 0.2–1.2)
Total Protein: 6.9 g/dL (ref 6.0–8.3)

## 2017-01-09 LAB — TSH: TSH: 0.84 u[IU]/mL (ref 0.35–4.50)

## 2017-01-09 NOTE — Progress Notes (Signed)
   Subjective:    Patient ID: Tina Bailey, female    DOB: 12/11/1945, 71 y.o.   MRN: [id number]  HPI CPE- UTD on colonoscopy, DEXA.  Due for mammo.  Pt has not had pneumonia vaccines.  Pt was recently hospitalized for acute respiratory failure and d/c'd home w/ O2.  This is very upsetting to her and she wants to know if she needs to continue the O2.     Review of Systems Patient reports no vision/ hearing changes, adenopathy,fever, weight change,  persistant/recurrent hoarseness , swallowing issues, chest pain, palpitations, edema, persistant/recurrent cough, hemoptysis, gastrointestinal bleeding (melena, rectal bleeding), abdominal pain, bowel changes, GU symptoms (dysuria, hematuria, incontinence), Gyn symptoms (abnormal  bleeding, pain),  syncope, focal weakness, memory loss, numbness & tingling, hair/nail changes, abnormal bruising or bleeding  + chronic pain + anxiety + SOB + adhesive rxn to nicotine patches on arms bilaterally +GERD- not interested in taking medication    Objective:   Physical Exam General Appearance:    Alert, cooperative, no distress, appears older than stated age, very thin  Head:    Normocephalic, without obvious abnormality, atraumatic  Eyes:    PERRL, conjunctiva/corneas clear, EOM's intact, fundi    benign, both eyes  Ears:    Normal TM's and external ear canals, both ears  Nose:   Nares normal, septum midline, mucosa normal, no drainage    or sinus tenderness.  O2 via [place] in place  Throat:   Lips, mucosa, and tongue normal; teeth and gums normal  Neck:   Supple, symmetrical, trachea midline, no adenopathy;    Thyroid: no enlargement/tenderness/nodules  Back:     Symmetric  Lungs:     Clear to auscultation bilaterally, respirations unlabored  Chest Wall:    No tenderness or deformity   Heart:    Regular rate and rhythm, S1 and S2 normal, no murmur, rub   or gallop  Breast Exam:    Deferred to mammo  Abdomen:     Soft, non-tender, bowel sounds  active all four quadrants,    no masses, no organomegaly  Genitalia:    Deferred  Rectal:    Extremities:   Extremities normal, atraumatic, no cyanosis or edema  Pulses:   2+ and symmetric all extremities  Skin:   Skin color, texture, turgor normal, no rashes or lesions  Lymph nodes:   Cervical, supraclavicular, and axillary nodes normal  Neurologic:   CNII-XII intact, normal strength, sensation and reflexes    throughout          Assessment & Plan:

## 2017-01-09 NOTE — Assessment & Plan Note (Signed)
Pt is taking Ensure to help supplement her oral intake.  Check labs.  Will continue to follow.

## 2017-01-09 NOTE — Patient Instructions (Signed)
Follow up in 3 months to recheck mood We'll notify you of your lab results and make any changes if needed Continue to eat regularly and drink Ensure at least once daily for the protein We'll call you with your Pulmonary appt to evaluate your breathing Please consider restarting your Zantac (Ranitidine) for the heartburn If you are having issues with your pain medication, please call Dr Nelva Bush Please consider getting the pneumonia vaccine- this is very important! Call with any questions or concerns Happy Fall!!!

## 2017-01-09 NOTE — Assessment & Plan Note (Signed)
Chronic problem.  UTD on DEXA.  Check Vit D and replete prn.

## 2017-01-09 NOTE — Assessment & Plan Note (Signed)
New.  Pt was d/c'd home from hospital w/ O2.  She is not sure if she is supposed to continue this or not.  Has not seen Pulmonary since 2016.  Referral placed.  'it's like my muscles don't want to let my lungs move'.

## 2017-01-09 NOTE — Assessment & Plan Note (Signed)
Pt's PE unchanged from previous.  Remains very thin, has O2 via [place] in place today.  UTD on colonoscopy.  Mammo ordered.  Declines flu and pneumonia shots despite a lengthy conversation.  No need for pap.  Check labs.  Anticipatory guidance provided.

## 2017-01-09 NOTE — Assessment & Plan Note (Signed)
Ongoing issue.  Following w/ Dr Burr Medico.

## 2017-01-10 ENCOUNTER — Encounter: Payer: Self-pay | Admitting: General Practice

## 2017-01-11 ENCOUNTER — Telehealth: Payer: Self-pay | Admitting: Family Medicine

## 2017-01-11 NOTE — Telephone Encounter (Signed)
Patient states she is still experiencing torso pain from pleurisy.  She states she needs an antibiotic to help get rid of it.  She is also requesting call back with lab results done Monday.

## 2017-01-12 NOTE — Telephone Encounter (Signed)
Pt informed of PCP notification and expresses an understanding. I have looked in her chart and there were referrals to pulmonology placed on 10/19 (by [facility]) and 10/22 (by Dr. Birdie Riddle). Could we see if there has been any progress?   Message routed to both [place] as I am not sure who is working with referrals today.

## 2017-01-12 NOTE — Telephone Encounter (Signed)
Pt did not mention any pleurisy pain when she was here and antibiotics are not the treatment for this anyway.  If she feels she needs abx, she would need an appt to be evaluated.  Her labs were normal and a letter was mailed.

## 2017-01-18 DIAGNOSIS — M5441 Lumbago with sciatica, right side: Secondary | ICD-10-CM | POA: Diagnosis not present

## 2017-01-18 DIAGNOSIS — M9903 Segmental and somatic dysfunction of lumbar region: Secondary | ICD-10-CM | POA: Diagnosis not present

## 2017-01-18 DIAGNOSIS — M47816 Spondylosis without myelopathy or radiculopathy, lumbar region: Secondary | ICD-10-CM | POA: Diagnosis not present

## 2017-01-20 DIAGNOSIS — M9903 Segmental and somatic dysfunction of lumbar region: Secondary | ICD-10-CM | POA: Diagnosis not present

## 2017-01-20 DIAGNOSIS — M5441 Lumbago with sciatica, right side: Secondary | ICD-10-CM | POA: Diagnosis not present

## 2017-01-20 DIAGNOSIS — M47816 Spondylosis without myelopathy or radiculopathy, lumbar region: Secondary | ICD-10-CM | POA: Diagnosis not present

## 2017-01-21 DIAGNOSIS — M5441 Lumbago with sciatica, right side: Secondary | ICD-10-CM | POA: Diagnosis not present

## 2017-01-21 DIAGNOSIS — M47816 Spondylosis without myelopathy or radiculopathy, lumbar region: Secondary | ICD-10-CM | POA: Diagnosis not present

## 2017-01-22 DIAGNOSIS — J441 Chronic obstructive pulmonary disease with (acute) exacerbation: Secondary | ICD-10-CM | POA: Diagnosis not present

## 2017-01-23 ENCOUNTER — Telehealth: Payer: Self-pay | Admitting: General Practice

## 2017-01-23 DIAGNOSIS — M47816 Spondylosis without myelopathy or radiculopathy, lumbar region: Secondary | ICD-10-CM | POA: Diagnosis not present

## 2017-01-23 DIAGNOSIS — M9903 Segmental and somatic dysfunction of lumbar region: Secondary | ICD-10-CM | POA: Diagnosis not present

## 2017-01-23 DIAGNOSIS — M5441 Lumbago with sciatica, right side: Secondary | ICD-10-CM | POA: Diagnosis not present

## 2017-01-23 NOTE — Telephone Encounter (Signed)
FYI, call from this weekend.   [facility] Patient Name: Tina Bailey Gender: Female DOB: 09-23-1945 Age: 71 Y 11 M 12 D Return Phone Number: [phone] (Primary) Address: City/State/Zip: [place] [address] Client [facility] [place] Night - C Client Site [place] Physician Dimple Nanas - MD Contact Type Call Who Is Calling Patient / Member / Family / Caregiver Call Type Triage / Clinical Relationship To Patient Self Return Phone Number [phone] (Primary) Chief Complaint Hip pain Reason for Call Symptomatic / Request for [place] is having hip pain and taking higher dose of hydrocodone, but it's not helping much. [place] Not Listed [facility] Urgent care [facility] Translation No Nurse Assessment Nurse: Elissa Hefty, RN, [place] Date/Time (Eastern Time): 01/21/2017 3:03:30 PM Confirm and document reason for call. If symptomatic, describe symptoms. ---Caller states her hip pain is getting worse. Went to [employer] and now the area where he has been adjusting is hurting. Hydrocodone is not working to control the pain. adding advil/aspirin only aggravates her stomach. Is on Baclofen as well prescribed months ago. Does the patient have any new or worsening symptoms? ---Yes Will a triage be completed? ---Yes Related visit to physician within the last 2 weeks? ---Yes Does the PT have any chronic conditions? (i.e. diabetes, asthma, etc.) ---Yes List chronic conditions. ---Osteoarthritis, Is this a behavioral health or substance abuse call? ---No Guidelines Guideline Title Affirmed Question Affirmed Notes Nurse Date/Time Eilene Ghazi Time) Hip Pain [1] SEVERE pain (e.g., excruciating, unable to do any normal activities) AND [2] not improved after 2 hours of pain medicine Phillis Haggis 01/21/2017 3:07:54 PM Disp. Time Eilene Ghazi Time) Disposition Final User PLEASE NOTE: All timestamps contained within this report are represented as [place] Standard Time. CONFIDENTIALTY NOTICE: This fax transmission is intended only for the addressee. It contains information that is legally privileged, confidential or otherwise protected from use or disclosure. If you are not the intended recipient, you are strictly prohibited from reviewing, disclosing, copying using or disseminating any of this information or taking any action in reliance on or regarding this information. If you have received this fax in error, please notify us immediately by telephone so that we can arrange for its return to [place]. Phone: [phone], Toll-Free: [phone], Fax: [phone] Page: 2 of 2 Call Id: [id number] 01/21/2017 3:14:05 PM See Physician within 4 Hours (or PCP triage) Yes Elissa Hefty, RN, [place] Disagree/Comply Comply Caller Understands Yes PreDisposition Did not know what to do Care Advice Given Per Guideline SEE PHYSICIAN WITHIN 4 HOURS (or PCP triage): * IF OFFICE WILL BE CLOSED AND NO PCP TRIAGE: You need to be seen within the next 3 or 4 hours. A nearby Urgent Care Center is often a good source of care. Another choice is to go to the ER. Go sooner if you become worse. BRING MEDICINES: * Please bring a list of your current medicines when you go to see the doctor. * It is also a good idea to bring the pill bottles too. This will help the doctor to make certain you are taking the right medicines and the right dose. PAIN MEDICINES: * For pain relief, take acetaminophen, ibuprofen, or naproxen. * Use the lowest amount that makes your pain feel better. CALL BACK IF: * You become worse. CARE ADVICE given per Hip Pain (Adult) guideline. IBUPROFEN (E.G., MOTRIN, ADVIL): * Take 400 mg (two  200 mg pills) by mouth every 6 hours as needed. * Another choice is to take 600 mg (three 200 mg pills) by  mouth every 8 hours as needed. * The most you should take each day is 1,200 mg (six 200 mg pills a day), unless your doctor has told you to take more. * Acetaminophen is thought to be safer than ibuprofen or naproxen for people over 21 years old. Acetaminophen is in many OTC and prescription medicines. It might be in more than one medicine that you are taking. You need to be careful and not take an overdose. An acetaminophen overdose can hurt the liver. [employer], the company that makes Tylenol, has different dosage instructions for Tylenol in [place] and the [place]. In [place], the maximum recommended dose per day is 4,000 mg or twelve (12) Regular-Strength (325 mg) pills. In the [place], [place] recommends a maximum dose of ten (10) Regular-Strength (325 mg) pills. * Before taking any medicine, read all the instructions on the package. * GASTROINTESTINAL RISK: There is an increased risk of stomach ulcers, GI bleeding, perforation. Referrals GO TO FACILITY OTHER - SPECIFY

## 2017-01-24 ENCOUNTER — Institutional Professional Consult (permissible substitution): Payer: PPO | Admitting: Internal Medicine

## 2017-01-25 DIAGNOSIS — M47816 Spondylosis without myelopathy or radiculopathy, lumbar region: Secondary | ICD-10-CM | POA: Diagnosis not present

## 2017-01-25 DIAGNOSIS — M5441 Lumbago with sciatica, right side: Secondary | ICD-10-CM | POA: Diagnosis not present

## 2017-01-25 DIAGNOSIS — M9903 Segmental and somatic dysfunction of lumbar region: Secondary | ICD-10-CM | POA: Diagnosis not present

## 2017-01-30 DIAGNOSIS — M9903 Segmental and somatic dysfunction of lumbar region: Secondary | ICD-10-CM | POA: Diagnosis not present

## 2017-01-30 DIAGNOSIS — M47816 Spondylosis without myelopathy or radiculopathy, lumbar region: Secondary | ICD-10-CM | POA: Diagnosis not present

## 2017-01-30 DIAGNOSIS — M5441 Lumbago with sciatica, right side: Secondary | ICD-10-CM | POA: Diagnosis not present

## 2017-01-31 ENCOUNTER — Telehealth: Payer: Self-pay | Admitting: Family Medicine

## 2017-01-31 NOTE — Telephone Encounter (Signed)
Copied from [place] [id number]. Topic: General - Other >> Jan 31, 2017  3:48 PM Cecelia Byars, NT wrote: Reason for CRM: Patient has an appointment  with Dr Marlene Lard , this Thursday and does not feel she needs to see him  at this time, she wants a order to cancel the oxygen, does not want anything new  at this time. Please call if she needs to come in tomorrow, says she will in called  [phone]

## 2017-01-31 NOTE — Telephone Encounter (Signed)
Advised patient to keep the appointment with Pulmonary.  She insists that she does not need the appointment, and that she was not going to do because she doesn't feel that this is necessary. She said that she knows that she will will just tell her that it's because she is a smoker.     Again, I advised that she keep her appointment as the provider felt the need to refer her to a specialist.    She is still unsure if she is going to keep this appointment, but she says she doesn't know if she will.  She said that even if she goes to him and he wants further testing she will probably just refuse that.  I asked that she reconsider and keep her appointment.    She ended the call by saying she would "think about it."

## 2017-01-31 NOTE — Telephone Encounter (Signed)
Copied from [place] [id number]. Topic: General - Other >> Jan 31, 2017  3:48 PM Cecelia Byars, NT wrote: Reason for CRM: Patient has an appointment  with Dr Marlene Lard , this Thursday and does not feel she needs to see him  at this time, she wants a order to cancel the oxygen, does not want anything new  at this time. Please call if she needs to come in tomorrow, says she will in called  [phone]

## 2017-02-01 DIAGNOSIS — M5441 Lumbago with sciatica, right side: Secondary | ICD-10-CM | POA: Diagnosis not present

## 2017-02-01 DIAGNOSIS — M9903 Segmental and somatic dysfunction of lumbar region: Secondary | ICD-10-CM | POA: Diagnosis not present

## 2017-02-01 DIAGNOSIS — M47816 Spondylosis without myelopathy or radiculopathy, lumbar region: Secondary | ICD-10-CM | POA: Diagnosis not present

## 2017-02-02 ENCOUNTER — Ambulatory Visit (INDEPENDENT_AMBULATORY_CARE_PROVIDER_SITE_OTHER): Payer: PPO | Admitting: Internal Medicine

## 2017-02-02 ENCOUNTER — Encounter: Payer: Self-pay | Admitting: Internal Medicine

## 2017-02-02 VITALS — BP 144/80 | HR 107 | Ht 63.0 in | Wt 98.2 lb

## 2017-02-02 DIAGNOSIS — J9601 Acute respiratory failure with hypoxia: Secondary | ICD-10-CM

## 2017-02-02 DIAGNOSIS — J449 Chronic obstructive pulmonary disease, unspecified: Secondary | ICD-10-CM

## 2017-02-02 DIAGNOSIS — F17210 Nicotine dependence, cigarettes, uncomplicated: Secondary | ICD-10-CM | POA: Diagnosis not present

## 2017-02-02 MED ORDER — FLUTICASONE-UMECLIDIN-VILANT 100-62.5-25 MCG/INH IN AEPB: 1.0000 | INHALATION_SPRAY | Freq: Every day | RESPIRATORY_TRACT | 0 refills | Status: DC

## 2017-02-02 NOTE — Assessment & Plan Note (Signed)
>   3 min Discussed the risks and costs (both direct and indirect)  of smoking relative to the benefits of quitting but patient unwilling to commit at this point to a specific quit date.       

## 2017-02-02 NOTE — Patient Instructions (Addendum)
The key is to stop smoking completely before smoking completely stops you!   Start Trelegy one click  - two good drags first thing in am  02 rec is 2lpm at bedtime and goal for daytime is keep over 90% at all times so as improve probably won't need daytime full time   Please schedule a follow up office visit in 4 weeks, sooner if needed with PFTs

## 2017-02-02 NOTE — Assessment & Plan Note (Signed)
Spirometry 03/03/2015   FEV1  1.04 (44%) ratio 58  - 03/03/2015  Walked RA x 3 laps @ 185 ft each stopped due to  End of study, brisk pace, no sob or desat   -  02/02/2017   Walked RA x one lap @ 185 stopped due to  Back pain/ sob no desats @ nl pace - 02/02/2017  After extensive coaching device effectiveness =    90% with DPI > try trelegy x one month then return for pfts    She appears to have progressed since last ov with debilitation related to osteoporosis/ back pain and active smoking since GOLD III criteria esablished  At this point hard to tell how much of her problem is reversible but since she just had an exac last month will assume  Group D in terms of symptom/risk and laba/lama/ICS  therefore appropriate rx at this point.   try trelegy x one month then ov with pfts    I had an extended discussion with the patient reviewing all relevant studies completed to date and  lasting 25 minutes of a 40  minute office visit to re-establish with me    re  severe non-specific but potentially very serious refractory respiratory symptoms of uncertain and potentially multiple  etiologies.   Each maintenance medication was reviewed in detail including most importantly the difference between maintenance and prns and under what circumstances the prns are to be triggered using an action plan format that is not reflected in the computer generated alphabetically organized AVS.    Please see AVS for specific instructions unique to this office visit that I personally wrote and verbalized to the the pt in detail and then reviewed with pt  by my nurse highlighting any changes in therapy/plan of care  recommended at today's visit.

## 2017-02-02 NOTE — Assessment & Plan Note (Signed)
Dx 12/2016 -  No desats walking 02/02/2017 but desats hs per self monitor   rec as of 02/02/2017  2lpm hs and prn daytime to keep sats > 90%   If she wants to get of the 02 she should first try to get rid of cigs and rx the copd more aggressively but seems very reluctant to agree to even trying any of the above recs today.

## 2017-02-02 NOTE — Progress Notes (Signed)
Subjective:    Patient ID: Tina Bailey, female    DOB: October 09, 1945,    MRN: [id number]    Brief patient profile: 51 yowf active smoker with mostly c/o chronic cough x years attributed to pnds with dx of copd in 2008 but not on resp rx and referred to pulmonary clinic 03/03/2015 by Dr Barry Dienes with GOLD III criteria on spirometry     History of Present Illness  03/03/2015 1st [facility] office visit/ Daneli Butkiewicz   Chief Complaint  Patient presents with  . PULMONARY CONSULT    Referred by Dr. Barry Dienes. Pt needs surgical clearance for liver resection tomorrow. Pt was told she has "mild" emphysema. Pt c/o occasional wheeze, cough with creamy thick mucus and some sinus/nasal congestion and drainage. Pt does smoke. Pt does not use inhalers.   cough x years esp in am's but Not limited by breathing from desired activities  Able to shop ok- no hcp Total amt of mucus typically < 2 tsp / white/ not usually bloody or purulent nor is it now rec You have moderately severe copd and need to quit smoking now but it is not prohibitive to surgery on your liver You are taking a risk by smoking up until the day of surgery > ideally you need to stop 2 weeks prior if at all possible to improve cough mechanics/ reduce sputum production  We can use duoneb (breathing treatments) perioperatively if needed     03/04/15  Liver surgery for hepatocellular ca / no chemo/RT but chronic pain since then     02/02/2017  f/u ov/Deion Swift re:  GOLD III/ still smoking  Chief Complaint  Patient presents with  . Pulmonary Consult    Referred by Dr. Annye Asa for eval of hypoxia. Pt states that she was admitted to the hospital for "a light touch of bronchitis" and CP 12/21/16-12/22/16 and was sent home with o2. She has not used o2 recently b/c she feels it's not needed.   can do [employer] leaning on cart stopping freq = MMRC3 = can't walk 100 yards even at a slow pace at a flat grade s stopping due to sob s 02 No 02  sleeps ok / but sats drop to 86% s 02 when she wakes up  Not using any maintenance  resp medications Uses neb qod on avg   No obvious day to day or daytime variability or assoc excess/ purulent sputum or mucus plugs or hemoptysis or cp or chest tightness, subjective wheeze or overt sinus or hb symptoms. No unusual exp hx or h/o childhood pna/ asthma or knowledge of premature birth.  Sleeping ok flat without nocturnal  or early am exacerbation  of respiratory  c/o's or need for noct saba. Also denies any obvious fluctuation of symptoms with weather or environmental changes or other aggravating or alleviating factors except as outlined above   Current Allergies, Complete Past Medical History, Past Surgical History, Family History, and Social History were reviewed in [employer] record.  ROS  The following are not active complaints unless bolded Hoarseness, sore throat, dysphagia, dental problems, itching, sneezing,  nasal congestion or discharge of excess mucus or purulent secretions, ear ache,   fever, chills, sweats, unintended wt loss or wt gain, classically pleuritic or exertional cp,  orthopnea pnd or leg swelling, presyncope, palpitations, abdominal pain, anorexia, nausea, vomiting, diarrhea  or change in bowel habits or change in bladder habits, change in stools or change in urine, dysuria, hematuria,  rash, arthralgias, visual complaints, headache, numbness, weakness or ataxia or problems with walking or coordination,  change in mood/affect or memory.        Current Meds  Medication Sig  . Calcium Carbonate-Vitamin D (CALCIUM 500+D HIGH POTENCY PO) Take 1 tablet by mouth daily.   . Cholecalciferol (VITAMIN D3) 5000 units CAPS Take 5,000 Units by mouth daily.  . Cyanocobalamin (VITAMIN B-12) 1000 MCG SUBL Place 1,000 mcg under the tongue daily.   . feeding supplement, ENSURE ENLIVE, (ENSURE ENLIVE) LIQD Take 237 mLs by mouth 2 (two) times daily between meals.  Marland Kitchen  guaiFENesin (MUCINEX) 600 MG 12 hr tablet Take 2 tablets (1,200 mg total) by mouth 2 (two) times daily.  Marland Kitchen HYDROcodone-acetaminophen (NORCO) 10-325 MG tablet Take 1 tablet every 6 (six) hours as needed by mouth.  Marland Kitchen HYDROcodone-acetaminophen (NORCO/VICODIN) 5-325 MG tablet Take 1 tablet by mouth 3 (three) times daily.   . Multiple Vitamins-Minerals (CENTRUM SILVER 50+WOMEN) TABS Take 1 tablet by mouth daily.  . OXYGEN 2lpm 24/7  [facility]              Objective:   Physical Exam  amb pleasant very talkative wf nad   02/02/2017     98   03/03/15 109 lb 6.4 oz (49.624 kg)  02/26/15 110 lb (49.896 kg)  01/13/15 114 lb (51.71 kg)    Vital signs reviewed - Note on arrival 02 sats  94% on RA    HEENT: nl dentition, turbinates, and oropharynx. Nl external ear canals without cough reflex   NECK :  without JVD/Nodes/TM/ nl carotid upstrokes bilaterally   LUNGS: no acc muscle use,  slt barrel chest with distant bs bilaterally and faint late exp wheezes   CV:  RRR  no s3 or murmur or increase in P2, no edema   ABD:  soft and nontender with nl inspiratory excursion in the supine position. No bruits or organomegaly, bowel sounds nl  MS:  Nl gait/ ext warm without deformities, calf tenderness, cyanosis or clubbing No obvious joint restrictions   SKIN: warm and dry without lesions    NEURO:  alert, approp, nl sensorium with  no motor deficits       I personally reviewed images and agree with radiology impression as follows:  CXR:   12/21/16 No acute abnormalities.  Emphysema.    Assessment & Plan:

## 2017-02-02 NOTE — Telephone Encounter (Signed)
Agree w/ advice given by Jettie Pagan.  Thanks for your efforts.

## 2017-02-06 DIAGNOSIS — M5441 Lumbago with sciatica, right side: Secondary | ICD-10-CM | POA: Diagnosis not present

## 2017-02-06 DIAGNOSIS — M9903 Segmental and somatic dysfunction of lumbar region: Secondary | ICD-10-CM | POA: Diagnosis not present

## 2017-02-06 DIAGNOSIS — M47816 Spondylosis without myelopathy or radiculopathy, lumbar region: Secondary | ICD-10-CM | POA: Diagnosis not present

## 2017-02-08 DIAGNOSIS — M5441 Lumbago with sciatica, right side: Secondary | ICD-10-CM | POA: Diagnosis not present

## 2017-02-08 DIAGNOSIS — M47816 Spondylosis without myelopathy or radiculopathy, lumbar region: Secondary | ICD-10-CM | POA: Diagnosis not present

## 2017-02-08 DIAGNOSIS — M9903 Segmental and somatic dysfunction of lumbar region: Secondary | ICD-10-CM | POA: Diagnosis not present

## 2017-02-13 ENCOUNTER — Telehealth: Payer: Self-pay | Admitting: *Deleted

## 2017-02-13 ENCOUNTER — Ambulatory Visit: Payer: PPO | Admitting: Physician Assistant

## 2017-02-13 NOTE — Telephone Encounter (Signed)
I do not fill controlled substances that were written by other providers (in this case, Dr Nelva Bush).  If she were under a controlled substance contract with [place] (which she is not, b/c we don't fill her medications), it clearly states that the substance in question is to come from only 1 provider.  In this case, that is Dr Nelva Bush.  I do not assume responsibility for another MD's prescriptions unless there is some discussion about it first.  Since she is questioning why she has a primary, and seems to question my motives and my judgement, it may be best we part ways.

## 2017-02-13 NOTE — Telephone Encounter (Signed)
Patient was scheduled on a PA's schedule for pain medication refill. Patient was told at her last appointment that she would have to contact Dr. Nelva Bush for pain medicine.  I called patient to try to explain this to her and she was very unhappy.  She said that this is a problem with [facility] and that she does not understand why we can't just give her the medication.   I explained that we had not prescribed that medicine, and that Dr. Nelva Bush should be able to help her.  .  She replied with "Excuse me? Are you willing to bank on that being your policy because I don't believe it" I advised her that according to the last note with PCP she was advised to call Dr. Nelva Bush regarding pain medication.  She was very upset and stated she didn't know why she had a Primary Care if this was the policy and we can't give her medications. She also stated that she is requesting a letter from [facility] verifying/stating  that this is our policy, and stating very clearly that we cannot fill her hydrocodone.

## 2017-02-14 NOTE — Telephone Encounter (Signed)
Spoke with patient and daughter in detail. Both were kind and apologized for yesterdays call. Patient is in pain and feels like it may be more relayed to her fibromyalgia than her back pain. Thinks the hydrocodone has helped to mask it but is in search of a more long term solution. Advised then to follow up with [facility] and to reach out to [facility] for possible further workup.

## 2017-02-21 DIAGNOSIS — J441 Chronic obstructive pulmonary disease with (acute) exacerbation: Secondary | ICD-10-CM | POA: Diagnosis not present

## 2017-02-28 ENCOUNTER — Encounter: Payer: Self-pay | Admitting: General Practice

## 2017-02-28 NOTE — Progress Notes (Signed)
[facility] Spiritual Care Note  Returned Tina Bailey's VM requesting a counseling referral for emotional support and assessment of pain med use. Pt states that she needs pain meds for ADLs, is also concerned about chemical dependency/substance abuse, and did not achieve desired relief from anxiety with previous Celexa rx. Spoke with Tina Bailey and her daughter Tina Bailey, encouraging Tina Bailey's seeking support.  Consulted with Tina Bailey/LCSW re referral resources. Tina Bailey plans to request physician referral to the [facility] when she sees Tina Bailey tomorrow (first MD appt). Emailed Tina Bailey the referral form per Tina Bailey's request.  Plan to f/u with [name] by phone later this week to check on appointment status.   [place], [place], [facility] Pager [phone] Voicemail [phone]

## 2017-03-01 DIAGNOSIS — S22000A Wedge compression fracture of unspecified thoracic vertebra, initial encounter for closed fracture: Secondary | ICD-10-CM | POA: Diagnosis not present

## 2017-03-01 DIAGNOSIS — G894 Chronic pain syndrome: Secondary | ICD-10-CM | POA: Diagnosis not present

## 2017-03-02 ENCOUNTER — Encounter: Payer: Self-pay | Admitting: General Practice

## 2017-03-02 NOTE — Progress Notes (Signed)
[facility] Spiritual Care Note  Followed up with Tina Bailey by phone. She is investigating emotional/mental health support resources and verbalized a concern that she may be labeled/dismissed as "noncompliant" because of discontinuing her Celexa prescription because it was causing anxiety. She also repeated concerns that her current pain regimen is not managing her pain adequately; per pt, she is hopeful that her MRI appt next week may reveal the cause of her pain so that there may be a new pain management/treatment option available to her. We plan to f/u by phone at the end of next week so that she can share and reflect on the next steps in this process.   [place], [place], [facility] Pager [phone] Voicemail [phone]

## 2017-03-03 ENCOUNTER — Ambulatory Visit: Payer: PPO | Admitting: Internal Medicine

## 2017-03-07 ENCOUNTER — Emergency Department (HOSPITAL_COMMUNITY)
Admission: EM | Admit: 2017-03-07 | Discharge: 2017-03-07 | Disposition: A | Discharge status: Home / Self Care | Payer: PPO | Attending: Emergency Medicine | Admitting: Emergency Medicine

## 2017-03-07 ENCOUNTER — Encounter (HOSPITAL_COMMUNITY): Payer: Self-pay | Admitting: Emergency Medicine

## 2017-03-07 ENCOUNTER — Other Ambulatory Visit: Payer: Self-pay

## 2017-03-07 DIAGNOSIS — R531 Weakness: Secondary | ICD-10-CM | POA: Diagnosis not present

## 2017-03-07 DIAGNOSIS — R52 Pain, unspecified: Secondary | ICD-10-CM | POA: Diagnosis not present

## 2017-03-07 DIAGNOSIS — F17210 Nicotine dependence, cigarettes, uncomplicated: Secondary | ICD-10-CM | POA: Diagnosis not present

## 2017-03-07 DIAGNOSIS — M546 Pain in thoracic spine: Secondary | ICD-10-CM | POA: Diagnosis not present

## 2017-03-07 DIAGNOSIS — M549 Dorsalgia, unspecified: Secondary | ICD-10-CM

## 2017-03-07 DIAGNOSIS — J449 Chronic obstructive pulmonary disease, unspecified: Secondary | ICD-10-CM | POA: Diagnosis not present

## 2017-03-07 DIAGNOSIS — G8929 Other chronic pain: Secondary | ICD-10-CM | POA: Diagnosis not present

## 2017-03-07 DIAGNOSIS — M5489 Other dorsalgia: Secondary | ICD-10-CM | POA: Insufficient documentation

## 2017-03-07 DIAGNOSIS — Z79899 Other long term (current) drug therapy: Secondary | ICD-10-CM | POA: Insufficient documentation

## 2017-03-07 MED ORDER — HYDROCODONE-ACETAMINOPHEN 10-325 MG PO TABS: 1.0000 | ORAL_TABLET | Freq: Three times a day (TID) | ORAL | 0 refills | Status: DC | PRN

## 2017-03-07 MED ORDER — HYDROMORPHONE HCL 1 MG/ML IJ SOLN
1.0000 mg | Freq: Once | INTRAMUSCULAR | Status: AC
  Administered 2017-03-07: 1 mg via INTRAMUSCULAR
  Filled 2017-03-07: qty 1

## 2017-03-07 NOTE — ED Provider Notes (Signed)
[facility] EMERGENCY DEPARTMENT Provider Note   CSN: [id number] Arrival date & time: 03/07/17  1122     History   Chief Complaint Chief Complaint  Patient presents with  . Back Pain    HPI Tina Bailey is a 71 y.o. female.  HPI 71 year old female with a history of osteoporosis and chronic back pain presents today complaining of 10/10 mid thoracic back pain that is worse with movement. Pt has had chronic back pain and is currently being tx by pain management. She has reported worsening pain since last month and is currently undergoing further workup with an MRI scheduled for tomorrow morning. Patient is currently on Norco 10-325 three times daily, but states she has been taking this 6 times daily and ran out of her medications earlier today. Her last dose was at 2:30am this morning and she took one tablet. She was last seen by pain management earlier this week. She denies any numbness tingling or weakness to the bilateral lower extremities.  States she has been able to ambulate at home when she takes her pain medicine.  Denies any loss of bowel or bladder control, but reports intermittent constipation and generalized abdominal pain.  She states that she takes a stool softener and stool stimulant relieve her constipation.  Her last bowel movement was yesterday.  Patient denies any blood in stool.  Patient denies any other symptoms at this time.   Patient has a history of COPD and states that she is on 2.5 L of oxygen at home.  Past Medical History:  Diagnosis Date  . Arthritis    DDD. Right shoulder"is frozen"-limited ROM. osteoporosis.  . Blue toe syndrome ([place]) 11/27/2014   Dr. Claudia Pollock evaluating  . Cancer ([place]) 12/2014   liver cancer  . Complication of anesthesia   . COPD (chronic obstructive pulmonary disease) ([place])   . Fibromyalgia   . Fracture of rib of right side    hx "osteoporosis"- states her dog nudge her on the side, next day developed great pain and was told has  a fracture rib.  . Macular degeneration    R eye  . Osteoporosis   . PONV (postoperative nausea and vomiting)    nausea,severe vomiting after 01-13-15 portal vein embolization  . Productive cough   . Retina disorder    L eye, vision distorted, edema  . Spine fracture due to birth trauma   . TMJ disease   . Wears glasses     Patient Active Problem List   Diagnosis Date Noted  . Physical exam 01/09/2017  . Malnutrition of moderate degree 12/22/2016  . Acute respiratory failure with hypoxia ([place]) 12/21/2016  . Anxiety about health 09/27/2016  . Osteoporosis 07/13/2016  . Chronic narcotic use 07/13/2016  . Constipation 04/26/2015  . Chronic pain 04/26/2015  . Depression 04/26/2015  . COPD GOLD III / still somking 03/03/2015  . Cigarette smoker 03/03/2015  . Cough 03/03/2015  . Hepatocellular carcinoma ([place]) 12/30/2014  . Pancreatic mass 12/04/2014  . Blue toe syndrome ([place]) 11/27/2014    Past Surgical History:  Procedure Laterality Date  . ANGIOPLASTY  2008   no stents required, no follow-up with cardiologist, no recurrent chest pain  . CATARACT EXTRACTION, BILATERAL Bilateral   . ESOPHAGOGASTRODUODENOSCOPY (EGD) WITH PROPOFOL N/A 06/25/2015   Procedure: ESOPHAGOGASTRODUODENOSCOPY (EGD) WITH PROPOFOL;  Surgeon: Milus Banister, MD;  Location: [facility] ENDOSCOPY;  Service: Endoscopy;  Laterality: N/A;  stent removal   . EYE SURGERY     cornea surgery  .  KIDNEY STONE SURGERY    . LAPAROSCOPIC PARTIAL HEPATECTOMY N/A 03/04/2015   Procedure: DIAGNOSTIC LAPAROSCOPY, EXTENDED RIGHT HEPATECTOMY, WITH INTRAOPERATIVE ULTRASOUND;  Surgeon: Stark Klein, MD;  Location: [facility] ORS;  Service: General;  Laterality: N/A;  . PARTIAL HYSTERECTOMY    . portal vein embolization     01-13-15 -Dr. Cathlean Sauer.    OB History    Gravida Para Term Preterm AB Living   3         3   SAB TAB Ectopic Multiple Live Births                   Home Medications    Prior to Admission medications     Medication Sig Start Date End Date Taking? Authorizing Provider  albuterol (PROAIR HFA) 108 (90 Base) MCG/ACT inhaler Inhale 1-2 puffs into the lungs every 6 (six) hours as needed. 12/22/16  Yes [provider]  baclofen (LIORESAL) 10 MG tablet Take 5 mg by mouth at bedtime as needed for muscle spasms.   Yes [provider]  bisacodyl (DULCOLAX) 5 MG EC tablet Take 10 mg by mouth daily as needed for moderate constipation.   Yes [provider]  Calcium Carbonate-Vitamin D (CALCIUM 500+D HIGH POTENCY PO) Take 1 tablet by mouth daily.    Yes [provider]  Carboxymethylcellul-Glycerin (CLEAR EYES FOR DRY EYES) 1-0.25 % SOLN Place 1-2 drops into both eyes daily as needed.   Yes [provider]  Cholecalciferol (VITAMIN D3) 5000 units CAPS Take 5,000 Units by mouth daily.   Yes [provider]  Cyanocobalamin (VITAMIN B-12) 1000 MCG SUBL Place 1,000 mcg under the tongue daily.    Yes [provider]  Fluticasone-Umeclidin-Vilant (TRELEGY ELLIPTA) 100-62.5-25 MCG/INH AEPB Inhale 1 puff daily into the lungs. 02/02/17  Yes Tanda Rockers, MD  guaiFENesin (MUCINEX) 600 MG 12 hr tablet Take 2 tablets (1,200 mg total) by mouth 2 (two) times daily. Patient taking differently: Take 600 mg by mouth 2 (two) times daily.  12/22/16  Yes Mariel Aloe, MD  OXYGEN 2lpm 24/7  [facility]   Yes [provider]  HYDROcodone-acetaminophen (NORCO) 10-325 MG tablet Take 1 tablet by mouth every 8 (eight) hours as needed. 03/07/17   Aliany Fiorenza S, PA-C    Family History Family History  Problem Relation Age of Onset  . Hypertension Mother   . Atrial fibrillation Mother   . Heart disease Mother        after age 37  . Stroke Father   . Dementia Father   . Heart disease Brother        After age 34- A-Fib  . Heart attack Brother     Social History Social History   Tobacco Use  . Smoking status: Current Every Day Smoker    Packs/day: 1.00     Years: 45.00    Pack years: 45.00    Types: Cigarettes    Last attempt to quit: 10/23/2016    Years since quitting: 0.3  . Smokeless tobacco: Never Used  Substance Use Topics  . Alcohol use: No    Alcohol/week: 0.0 oz  . Drug use: No     Allergies   Amoxicillin-pot clavulanate; Clindamycin/lincomycin; Paroxetine hcl; and Sulfa antibiotics   Review of Systems Review of Systems  Constitutional: Negative for chills and fever.  HENT: Negative for ear pain and sore throat.   Eyes: Negative for pain and visual disturbance.  Respiratory: Negative for cough and shortness of  breath.   Cardiovascular: Negative for chest pain and palpitations.  Gastrointestinal: Positive for abdominal pain and constipation. Negative for blood in stool, diarrhea, nausea and vomiting.  Genitourinary: Negative for dysuria and hematuria.  Musculoskeletal: Positive for back pain. Negative for arthralgias, gait problem and neck pain.  Skin: Negative for color change and rash.  Neurological: Negative for seizures, syncope, weakness and numbness.       No loss of bowel or bladder control  All other systems reviewed and are negative.    Physical Exam Updated Vital Signs BP (!) 145/74 (BP Location: Left Arm)   Pulse 87   Temp 98.6 F (37 C) (Oral)   Resp 18   Ht 5\' 3"  (1.6 m)   Wt 44.5 kg (98 lb)   SpO2 99%   BMI 17.36 kg/m   Physical Exam  Constitutional: She appears well-developed and well-nourished. She appears distressed.  HENT:  Head: Normocephalic and atraumatic.  Eyes: Conjunctivae are normal.  Neck: Neck supple.  No c-spine TTP  Cardiovascular: Normal rate and regular rhythm.  No murmur heard. Pulmonary/Chest: Effort normal. No respiratory distress. She has wheezes (scant expiratory wheezes).  Abdominal: Soft. There is no tenderness.  Musculoskeletal: She exhibits no edema.  Tenderness to palpation to the mid thoracic spine and paraspinous muscles.  Neurological: She is alert.  Strength  5/5 to the BLE. Dorsiflexion/plantarflexion intact. Normal sensation to BLE. Ambulated patient. Normal coordination with no ataxia.   Skin: Skin is warm and dry.  Psychiatric: She has a normal mood and affect.  Nursing note and vitals reviewed.    ED Treatments / Results  Labs (all labs ordered are listed, but only abnormal results are displayed) Labs Reviewed - No data to display  EKG  EKG Interpretation None       Radiology No results found.  Procedures Procedures (including critical care time)  Medications Ordered in ED Medications  HYDROmorphone (DILAUDID) injection 1 mg (1 mg Intramuscular Given 03/07/17 1420)     Initial Impression / Assessment and Plan / ED Course  I have reviewed the triage vital signs and the nursing notes.  Pertinent labs & imaging results that were available during my care of the patient were reviewed by me and considered in my medical decision making (see chart for details).  Looked patient up on [place] controlled substance database. Her last prescriptions were given by the Dr. Nelva Bush.  Last prescription for Norco given on 11/28. patient given a one-month supply. she was also given diazepam 10 mg by Dr. Nelva Bush on 12/12.   1350 Discussed pt case with patients pain management doctor, Dr. Suella Broad. He advised that I give the patient a short supply of her regularly scheduled pain meds. He discussed the risks of overdosing and advised that I speak to the patient about these concerns.  He further advised that the patient should contact the office later today.   1435 Rechecked patient.  She just received Dilaudid a few minutes ago and states that she is already feeling somewhat better.  Discussed my conversation with Dr. Nelva Bush and emphasized the importance of taking her medication as prescribed.  Discussed the risks of respiratory distress depression, overdose, and death if taking an increased dose of this medication.  Discussed that Dr. Nelva Bush  recommended sending her home home with a few days supply of her current pain medication regimen and that she needs to follow-up with his office for further follow-up.  Advised patient to go to her scheduled MRI tomorrow  morning.  Patient and her daughter voiced understanding of this plan and of the risks of taking her medication more often than prescribed.  1511 rechecked patient.  Ambulated patient while on o2 monitor. Pt sats between 89-91% on RA. Pt able to ambulate without difficulty or ataxia. She does report a mild amount of pain with ambulation. Pain has greatly improved since Dilaudid.   Recheck patient.  She feels improved after Dilaudid.  She feels that she is ready to go home.  Reiterated the importance of taking her medication as prescribed and further advised her not to take more than 1 tablet every 8 hours.  Advised her not to take her next dose until 10:30 PM as she already received Dilaudid in the ED.  Patient's family states that they were called the patient's pain management office already.  Discussed plan for discharge with strict precautions to return to the ER if she experiences any worsening back pain weakness or numbness to the bilateral lower extremities, or any loss of bladder or bowel control.  Advised her to return if there are any new or worsening symptoms.  Patient and her daughter understand the plan and agree to follow-up with pain management.  All questions answered  Final Clinical Impressions(s) / ED Diagnoses   Final diagnoses:  Chronic back pain, unspecified back location, unspecified back pain laterality   Patient with a history of osteoporosis and chronic back pain presents to the ED complaining of back pain after running out of her prescribed pain medicine at home.  Patient with worsening pain for months is currently being seen by pain management and undergoing further workup with an MRI scheduled tomorrow.  No changes in back pain today and neurologic exam was  negative. patient was able to ambulate in the ED without issue after taking Dilaudid.  No imaging indicated at this time Dr. Nelva Bush, patient's pain management doctor, advised to give short supply of pain medication until patient can be seen.  Advised patient to take this as directed, and discussed the risks of taking more than her allotted dose.  Patient understands and agrees to take her medication as directed.  They plan to follow-up with the patient's doctor within the next day.   ED Discharge Orders        Ordered    HYDROcodone-acetaminophen [facility]) 10-325 MG tablet  Every 8 hours PRN     03/07/17 1536      Syrena Burges S, PA-C 03/07/17 2135  Noemi Chapel, MD 03/08/17 1227

## 2017-03-07 NOTE — ED Provider Notes (Signed)
The patient is a 71 year old female, she was diagnosed with liver cancer a couple of years ago and underwent a primary liver resection which was curative, she had no chemo or radiation however after surgery she had multiple painful events from sneezing coughing or other minor trauma which caused multiple spinal fractures and compression fractures.  She has been treated by the orthopedic office is under the care of Dr. Herma Mering and has been taking medications such as hydrocodone 10 mg tablets.  She was being tapered from 3 down to 2 a day however her pain is become worse and and she has been evaluated with an MRI ordered for tomorrow.  She states that after she takes her hydrocodone her pain gets better and she is able to walk around the house but then the pain comes back.  Her pain tolerance is becoming lower, the pain medicine is lasting a shorter period of time.  She is now out of medications taking her last dose 12 hours ago.  She does endorse taking more than she has been prescribed because her pain levels have been higher.  She denies fevers but she does endorse some constipation.  She denies numbness or weakness of the legs  On exam the patient has normal strength and sensation of the bilateral lower extremities, she does have significant paraspinal tenderness however minimal spinal tenderness of the lumbar spine.  She has a mildly diffuse abdominal tenderness, she is not tachypneic tachycardic or febrile.  The patient has an MRI scheduled for tomorrow, she will need to have some pain medication here as she does appear to be in significant discomfort, we will also attempt to discuss her care with her primary orthopedist and pain clinic.  Medical screening examination/treatment/procedure(s) were conducted as a shared visit with non-physician practitioner(s) and myself.  I personally evaluated the patient during the encounter.  Clinical Impression:   Final diagnoses:  Chronic back pain, unspecified  back location, unspecified back pain laterality         Noemi Chapel, MD 03/08/17 1225

## 2017-03-07 NOTE — Discharge Instructions (Signed)
After speaking with your doctor, Dr. Nelva Bush, he confirmed that I could give you a short supply of your current medication to take before you are seen again in his office.  You were given a prescription for a 4 day supply of Norco.  You can take one tablet every 8 hours, up to 3 times a day as needed for pain.  Do NOT take more than 1 tablet every 8 hours.  This is a very strong pain medication that can cause respiratory depression and death if you take a dose that is stronger than you were prescribed.  Please call Dr. Nelva Bush' office within the next 12-24 hours to schedule an appointment.  Please go to your MRI that is scheduled for tomorrow morning.  If you have any worsening back pain, numbness or weakness to your legs, urinary or bowel incontinence, or any new or worsening symptoms please return to the ER immediately.

## 2017-03-07 NOTE — ED Triage Notes (Signed)
Pt c/o chronic back pain. Pt prescribed hydrocodone 10-325 three times a day from pain clinic. Pt states she was taking 6 of them a day. Ran out of pain meds and called pain clinic and pt states they would not prescribed anymore pain meds. VSS. nad

## 2017-03-08 ENCOUNTER — Other Ambulatory Visit: Payer: Self-pay

## 2017-03-08 DIAGNOSIS — S22089D Unspecified fracture of T11-T12 vertebra, subsequent encounter for fracture with routine healing: Secondary | ICD-10-CM | POA: Diagnosis not present

## 2017-03-09 ENCOUNTER — Telehealth: Payer: Self-pay | Admitting: Family Medicine

## 2017-03-09 NOTE — Telephone Encounter (Signed)
Pt called because she is having so much chronic pain that is not relieved by her Norco. She states the pain has increase and the pain clinic requested an MRI for her but can't really do anything until they see the results which she states they won't have until the 82 th of December. Advised patient to call to see if they have results and check with her pharmacist to see what other meds she can take for pain until she goes back to the pain clinic. Pt voiced understanding.

## 2017-03-10 ENCOUNTER — Other Ambulatory Visit (HOSPITAL_COMMUNITY): Payer: Self-pay | Admitting: Interventional Radiology

## 2017-03-10 DIAGNOSIS — S32020A Wedge compression fracture of second lumbar vertebra, initial encounter for closed fracture: Secondary | ICD-10-CM

## 2017-03-10 DIAGNOSIS — S22000A Wedge compression fracture of unspecified thoracic vertebra, initial encounter for closed fracture: Secondary | ICD-10-CM

## 2017-03-10 DIAGNOSIS — S32030A Wedge compression fracture of third lumbar vertebra, initial encounter for closed fracture: Secondary | ICD-10-CM

## 2017-03-10 NOTE — Telephone Encounter (Signed)
Patient is aware that we do not manage this medication.  She has contacted Dr. Nelva Bush and is waiting for a call back.

## 2017-03-10 NOTE — Telephone Encounter (Signed)
Her Pain specialist manages medication so if they feel further Rx medication is needed presently, they would be the ones to prescribe or discuss proper OTC medications. Dr. Birdie Riddle does not manage this for her.

## 2017-03-16 DIAGNOSIS — S22000A Wedge compression fracture of unspecified thoracic vertebra, initial encounter for closed fracture: Secondary | ICD-10-CM | POA: Diagnosis not present

## 2017-03-16 DIAGNOSIS — G894 Chronic pain syndrome: Secondary | ICD-10-CM | POA: Diagnosis not present

## 2017-03-16 DIAGNOSIS — M546 Pain in thoracic spine: Secondary | ICD-10-CM | POA: Diagnosis not present

## 2017-03-17 ENCOUNTER — Encounter: Payer: Self-pay | Admitting: General Practice

## 2017-03-17 NOTE — Progress Notes (Signed)
[facility] Spiritual Care Note  Attempted f/u phone call, but no answer, no VM pickup. Out of office next week, so plan to f/u week of 03/27/17.   [place], [place], [facility] Pager [phone] Voicemail [phone]

## 2017-03-22 DIAGNOSIS — S22000A Wedge compression fracture of unspecified thoracic vertebra, initial encounter for closed fracture: Secondary | ICD-10-CM | POA: Insufficient documentation

## 2017-03-26 ENCOUNTER — Other Ambulatory Visit: Payer: Self-pay | Admitting: Family Medicine

## 2017-03-27 ENCOUNTER — Encounter: Payer: Self-pay | Admitting: General Practice

## 2017-03-27 NOTE — Progress Notes (Signed)
[facility] Spiritual Care Note  Followed up with Tina Bailey by phone because she values Spiritual Care and emotional support, particularly due to pain/morale issues and social isolation.  Per pt, scans revealed five vertebral fx, and she still has significant pain even with oxycodone; she states that she doesn't like the way oxy makes her feel (confusion/memory issues), but she appreciates that her dtr and son are managing her dosing/dispensing so that she takes it properly. Per pt, her children are also encouraging her to eat and helping with other ADLs that are very difficult with such pain. [name] also states that she feels no desire or ability to harm herself.   Mental unclarity seems to be compromising her QOL. She states that she is overwhelmed by managing so many specialist appts and has difficulty traveling to appts because of pain.  [name] knows to contact chaplain anytime she would like to connect and requests periodic f/u calls for emotional support. She became tearful with gratitude as she thanked me for my call, listening, and encouragement.   [place], [place], [facility] Pager [phone] Voicemail [phone]

## 2017-03-29 ENCOUNTER — Ambulatory Visit (HOSPITAL_COMMUNITY)
Admission: RE | Admit: 2017-03-29 | Discharge: 2017-03-29 | Disposition: A | Discharge status: Home / Self Care | Payer: PPO | Source: Ambulatory Visit | Attending: Interventional Radiology | Admitting: Interventional Radiology

## 2017-03-29 DIAGNOSIS — S32030A Wedge compression fracture of third lumbar vertebra, initial encounter for closed fracture: Secondary | ICD-10-CM

## 2017-03-29 DIAGNOSIS — S22000A Wedge compression fracture of unspecified thoracic vertebra, initial encounter for closed fracture: Secondary | ICD-10-CM

## 2017-03-29 DIAGNOSIS — S32020A Wedge compression fracture of second lumbar vertebra, initial encounter for closed fracture: Secondary | ICD-10-CM

## 2017-03-29 HISTORY — PX: IR RADIOLOGIST EVAL & MGMT: IMG5224

## 2017-03-31 ENCOUNTER — Encounter (HOSPITAL_COMMUNITY): Payer: Self-pay | Admitting: Interventional Radiology

## 2017-03-31 DIAGNOSIS — M546 Pain in thoracic spine: Secondary | ICD-10-CM

## 2017-03-31 DIAGNOSIS — G8929 Other chronic pain: Secondary | ICD-10-CM | POA: Insufficient documentation

## 2017-04-03 ENCOUNTER — Other Ambulatory Visit (HOSPITAL_COMMUNITY): Payer: Self-pay | Admitting: Interventional Radiology

## 2017-04-03 DIAGNOSIS — S22060A Wedge compression fracture of T7-T8 vertebra, initial encounter for closed fracture: Secondary | ICD-10-CM

## 2017-04-03 DIAGNOSIS — S32030A Wedge compression fracture of third lumbar vertebra, initial encounter for closed fracture: Secondary | ICD-10-CM

## 2017-04-03 DIAGNOSIS — S22070A Wedge compression fracture of T9-T10 vertebra, initial encounter for closed fracture: Secondary | ICD-10-CM

## 2017-04-03 DIAGNOSIS — S22080A Wedge compression fracture of T11-T12 vertebra, initial encounter for closed fracture: Secondary | ICD-10-CM

## 2017-04-10 ENCOUNTER — Encounter: Payer: Self-pay | Admitting: General Practice

## 2017-04-10 NOTE — Progress Notes (Signed)
[facility] Spiritual Care Note  Followed up with Tina Bailey by phone for spiritual support. Tina Bailey shared richly about her church life, faith formation through [employer] and hymns, and understanding of where God is in her life right now. Using pastoral reflection and life review, she identified sources of meaning, contribution, challenge, and growth in her personal life and relationships with others. She found a lot of comfort in having her insights and growth reflected back to her (and thus seen/honored/validated)--which is especially important when she feels frustrated about being so limited in how she is able to serve God now because of her pain and low energy. She values being held in prayer and also being able to request prayer as a centering tool when she feels anxious; to this end, she plans to call chaplain for support and connection as desired. She has my direct dial number, and I will also follow her for continued support.   [place], [place], [facility] Pager [phone] Voicemail [phone]

## 2017-04-17 ENCOUNTER — Other Ambulatory Visit (HOSPITAL_COMMUNITY): Payer: Self-pay | Admitting: Interventional Radiology

## 2017-04-17 DIAGNOSIS — S32030A Wedge compression fracture of third lumbar vertebra, initial encounter for closed fracture: Secondary | ICD-10-CM

## 2017-04-17 DIAGNOSIS — S22060A Wedge compression fracture of T7-T8 vertebra, initial encounter for closed fracture: Secondary | ICD-10-CM

## 2017-04-17 DIAGNOSIS — S22070A Wedge compression fracture of T9-T10 vertebra, initial encounter for closed fracture: Secondary | ICD-10-CM

## 2017-04-17 DIAGNOSIS — S22080A Wedge compression fracture of T11-T12 vertebra, initial encounter for closed fracture: Secondary | ICD-10-CM

## 2017-04-19 ENCOUNTER — Other Ambulatory Visit: Payer: PPO

## 2017-04-19 ENCOUNTER — Ambulatory Visit: Payer: PPO | Admitting: Hematology

## 2017-04-19 ENCOUNTER — Other Ambulatory Visit: Payer: Self-pay | Admitting: Radiology

## 2017-04-20 ENCOUNTER — Telehealth (HOSPITAL_COMMUNITY): Payer: Self-pay

## 2017-04-20 ENCOUNTER — Telehealth (HOSPITAL_COMMUNITY): Payer: Self-pay | Admitting: *Deleted

## 2017-04-20 ENCOUNTER — Encounter (HOSPITAL_COMMUNITY): Payer: Self-pay

## 2017-04-20 ENCOUNTER — Ambulatory Visit (HOSPITAL_COMMUNITY)
Admission: RE | Admit: 2017-04-20 | Discharge: 2017-04-20 | Disposition: A | Discharge status: Home / Self Care | Payer: Medicare Other | Source: Ambulatory Visit | Attending: Interventional Radiology | Admitting: Interventional Radiology

## 2017-04-20 DIAGNOSIS — Z5309 Procedure and treatment not carried out because of other contraindication: Secondary | ICD-10-CM | POA: Diagnosis not present

## 2017-04-20 DIAGNOSIS — J449 Chronic obstructive pulmonary disease, unspecified: Secondary | ICD-10-CM | POA: Insufficient documentation

## 2017-04-20 DIAGNOSIS — M810 Age-related osteoporosis without current pathological fracture: Secondary | ICD-10-CM | POA: Diagnosis not present

## 2017-04-20 DIAGNOSIS — M1990 Unspecified osteoarthritis, unspecified site: Secondary | ICD-10-CM | POA: Diagnosis not present

## 2017-04-20 DIAGNOSIS — S22060A Wedge compression fracture of T7-T8 vertebra, initial encounter for closed fracture: Secondary | ICD-10-CM

## 2017-04-20 DIAGNOSIS — M4854XA Collapsed vertebra, not elsewhere classified, thoracic region, initial encounter for fracture: Secondary | ICD-10-CM | POA: Insufficient documentation

## 2017-04-20 DIAGNOSIS — Z880 Allergy status to penicillin: Secondary | ICD-10-CM | POA: Insufficient documentation

## 2017-04-20 DIAGNOSIS — H3530 Unspecified macular degeneration: Secondary | ICD-10-CM | POA: Diagnosis not present

## 2017-04-20 DIAGNOSIS — Z882 Allergy status to sulfonamides status: Secondary | ICD-10-CM | POA: Insufficient documentation

## 2017-04-20 DIAGNOSIS — F17210 Nicotine dependence, cigarettes, uncomplicated: Secondary | ICD-10-CM | POA: Insufficient documentation

## 2017-04-20 DIAGNOSIS — M797 Fibromyalgia: Secondary | ICD-10-CM | POA: Diagnosis not present

## 2017-04-20 DIAGNOSIS — S22080A Wedge compression fracture of T11-T12 vertebra, initial encounter for closed fracture: Secondary | ICD-10-CM

## 2017-04-20 DIAGNOSIS — S22070A Wedge compression fracture of T9-T10 vertebra, initial encounter for closed fracture: Secondary | ICD-10-CM

## 2017-04-20 DIAGNOSIS — Z8249 Family history of ischemic heart disease and other diseases of the circulatory system: Secondary | ICD-10-CM | POA: Diagnosis not present

## 2017-04-20 DIAGNOSIS — S32030A Wedge compression fracture of third lumbar vertebra, initial encounter for closed fracture: Secondary | ICD-10-CM

## 2017-04-20 DIAGNOSIS — M4856XA Collapsed vertebra, not elsewhere classified, lumbar region, initial encounter for fracture: Secondary | ICD-10-CM | POA: Insufficient documentation

## 2017-04-20 LAB — BASIC METABOLIC PANEL
Anion gap: 9 (ref 5–15)
BUN: 11 mg/dL (ref 6–20)
CO2: 28 mmol/L (ref 22–32)
Calcium: 9.1 mg/dL (ref 8.9–10.3)
Chloride: 96 mmol/L — ABNORMAL LOW (ref 101–111)
Creatinine, Ser: 0.54 mg/dL (ref 0.44–1.00)
GFR calc Af Amer: >60 mL/min (ref >60)
GFR calc non Af Amer: >60 mL/min (ref >60)
Glucose, Bld: 97 mg/dL (ref 65–99)
Potassium: 4.8 mmol/L (ref 3.5–5.1)
Sodium: 133 mmol/L — ABNORMAL LOW (ref 135–145)

## 2017-04-20 LAB — CBC
HCT: 45.4 % (ref 36.0–46.0)
Hemoglobin: 15 g/dL (ref 12.0–15.0)
MCH: 31.1 pg (ref 26.0–34.0)
MCHC: 33 g/dL (ref 30.0–36.0)
MCV: 94.2 fL (ref 78.0–100.0)
Platelets: 222 10*3/uL (ref 150–400)
RBC: 4.82 MIL/uL (ref 3.87–5.11)
RDW: 12.7 % (ref 11.5–15.5)
WBC: 5.7 10*3/uL (ref 4.0–10.5)

## 2017-04-20 LAB — APTT: aPTT: 33 seconds (ref 24–36)

## 2017-04-20 LAB — PROTIME-INR
INR: 1
Prothrombin Time: 13.1 seconds (ref 11.4–15.2)

## 2017-04-20 MED ORDER — TOBRAMYCIN SULFATE 1.2 G IJ SOLR
INTRAMUSCULAR | Status: AC
  Filled 2017-04-20: qty 1.2

## 2017-04-20 MED ORDER — IOPAMIDOL (ISOVUE-300) INJECTION 61%
INTRAVENOUS | Status: AC
  Filled 2017-04-20: qty 50

## 2017-04-20 MED ORDER — FENTANYL CITRATE (PF) 100 MCG/2ML IJ SOLN
INTRAMUSCULAR | Status: AC | PRN
  Administered 2017-04-20: 12.5 ug via INTRAVENOUS

## 2017-04-20 MED ORDER — BUPIVACAINE HCL (PF) 0.5 % IJ SOLN
INTRAMUSCULAR | Status: AC
  Filled 2017-04-20: qty 30

## 2017-04-20 MED ORDER — VANCOMYCIN HCL IN DEXTROSE 1-5 GM/200ML-% IV SOLN: 1000.0000 mg | INTRAVENOUS | Status: DC

## 2017-04-20 MED ORDER — FENTANYL CITRATE (PF) 100 MCG/2ML IJ SOLN
INTRAMUSCULAR | Status: AC
  Filled 2017-04-20: qty 2

## 2017-04-20 MED ORDER — SODIUM CHLORIDE 0.9 % IV SOLN: INTRAVENOUS | Status: DC

## 2017-04-20 NOTE — Telephone Encounter (Signed)
Called and LM with daughter to come back to IR.

## 2017-04-20 NOTE — H&P (Signed)
Chief Complaint: Patient was seen in consultation today for multiple vertebral fractures at the request of Deveshwar,Sanjeev    Supervising Physician: Luanne Bras  Patient Status: [facility] - Out-pt  History of Present Illness: Tina Bailey is a 72 y.o. female with back pain found to have multiple compression fractures of the thoracic and lumbar spine. She was seen in consult by Dr. Estanislado Pandy and is now scheduled for VP/KP procedure. PMHx, meds, labs, imaging, allergies reviewed. Has been NPO this am. Feels well except continued back pain. No recent fevers, chills, illness. Daughter at bedside.  Past Medical History:  Diagnosis Date  . Arthritis    DDD. Right shoulder"is frozen"-limited ROM. osteoporosis.  . Blue toe syndrome ([place]) 11/27/2014   Dr. Claudia Pollock evaluating  . Cancer ([place]) 12/2014   liver cancer  . Complication of anesthesia   . COPD (chronic obstructive pulmonary disease) ([place])   . Fibromyalgia   . Fracture of rib of right side    hx "osteoporosis"- states her dog nudge her on the side, next day developed great pain and was told has a fracture rib.  . Macular degeneration    R eye  . Osteoporosis   . PONV (postoperative nausea and vomiting)    nausea,severe vomiting after 01-13-15 portal vein embolization  . Productive cough   . Retina disorder    L eye, vision distorted, edema  . Spine fracture due to birth trauma   . TMJ disease   . Wears glasses     Past Surgical History:  Procedure Laterality Date  . ANGIOPLASTY  2008   no stents required, no follow-up with cardiologist, no recurrent chest pain  . CATARACT EXTRACTION, BILATERAL Bilateral   . ESOPHAGOGASTRODUODENOSCOPY (EGD) WITH PROPOFOL N/A 06/25/2015   Procedure: ESOPHAGOGASTRODUODENOSCOPY (EGD) WITH PROPOFOL;  Surgeon: Milus Banister, MD;  Location: [facility] ENDOSCOPY;  Service: Endoscopy;  Laterality: N/A;  stent removal   . EYE SURGERY     cornea surgery  . IR RADIOLOGIST EVAL & MGMT   03/29/2017  . KIDNEY STONE SURGERY    . LAPAROSCOPIC PARTIAL HEPATECTOMY N/A 03/04/2015   Procedure: DIAGNOSTIC LAPAROSCOPY, EXTENDED RIGHT HEPATECTOMY, WITH INTRAOPERATIVE ULTRASOUND;  Surgeon: Stark Klein, MD;  Location: [facility] ORS;  Service: General;  Laterality: N/A;  . PARTIAL HYSTERECTOMY    . portal vein embolization     01-13-15 -Dr. Cathlean Sauer.    Allergies: Amoxicillin-pot clavulanate; Clindamycin/lincomycin; Paroxetine hcl; and Sulfa antibiotics  Medications: Prior to Admission medications   Medication Sig Start Date End Date Taking? Authorizing Provider  bisacodyl (DULCOLAX) 5 MG EC tablet Take 10 mg by mouth daily as needed for moderate constipation.   Yes [provider]  Calcium Carbonate-Vitamin D (CALCIUM 500+D HIGH POTENCY PO) Take 1 tablet by mouth daily.    Yes [provider]  Carboxymethylcellul-Glycerin (CLEAR EYES FOR DRY EYES) 1-0.25 % SOLN Place 1-2 drops into both eyes daily as needed.   Yes [provider]  Cholecalciferol (VITAMIN D3) 5000 units CAPS Take 5,000 Units by mouth daily.   Yes [provider]  Cyanocobalamin (VITAMIN B-12) 1000 MCG SUBL Place 1,000 mcg under the tongue daily.    Yes [provider]  naproxen sodium (ALEVE) 220 MG tablet Take 440 mg by mouth 2 (two) times daily with a meal.   Yes [provider]  OxyCODONE HCl, Abuse Deter, (OXAYDO) 5 MG TABA Take 5 mg by mouth 5 (five) times daily.   Yes [provider]  QUEtiapine (SEROQUEL) 25  MG tablet Take 25 mg by mouth at bedtime.   Yes [provider]  albuterol (PROAIR HFA) 108 (90 Base) MCG/ACT inhaler Inhale 1-2 puffs into the lungs every 6 (six) hours as needed for shortness of breath.  12/22/16   [provider]     Family History  Problem Relation Age of Onset  . Hypertension Mother   . Atrial fibrillation Mother   . Heart disease Mother        after age 7  . Stroke Father   . Dementia Father   .  Heart disease Brother        After age 25- A-Fib  . Heart attack Brother     Social History   Socioeconomic History  . Marital status: Divorced    Spouse name: Not on file  . Number of children: Not on file  . Years of education: Not on file  . Highest education level: Not on file  Social Needs  . Financial resource strain: Not on file  . Food insecurity - worry: Not on file  . Food insecurity - inability: Not on file  . Transportation needs - medical: Not on file  . Transportation needs - non-medical: Not on file  Occupational History  . Not on file  Tobacco Use  . Smoking status: Current Every Day Smoker    Packs/day: 1.00    Years: 45.00    Pack years: 45.00    Types: Cigarettes    Last attempt to quit: 10/23/2016    Years since quitting: 0.4  . Smokeless tobacco: Never Used  Substance and Sexual Activity  . Alcohol use: No    Alcohol/week: 0.0 oz  . Drug use: No  . Sexual activity: Not on file  Other Topics Concern  . Not on file  Social History Narrative  . Not on file      Review of Systems: A 12 point ROS discussed and pertinent positives are indicated in the HPI above.  All other systems are negative.  Review of Systems  Vital Signs: BP (!) 154/74   Pulse 90   Temp 98.8 F (37.1 C)   Resp 18   Ht 5\' 3"  (1.6 m)   Wt 92 lb (41.7 kg)   SpO2 90%   BMI 16.30 kg/m   Physical Exam  Constitutional: She is oriented to person, place, and time. She appears well-developed. No distress.  HENT:  Head: Normocephalic.  Mouth/Throat: Oropharynx is clear and moist.  Neck: Normal range of motion. No JVD present. No tracheal deviation present.  Cardiovascular: Normal rate, regular rhythm and normal heart sounds.  Pulmonary/Chest: Effort normal and breath sounds normal. No respiratory distress.  Neurological: She is alert and oriented to person, place, and time.  Skin: Skin is warm and dry.  Psychiatric: She has a normal mood and affect. Judgment normal.     Imaging: Ir Radiologist Eval & Mgmt  Result Date: 03/31/2017 EXAM: NEW PATIENT OFFICE VISIT CHIEF COMPLAINT: Severe thoracic and upper lumbar pain secondary to multiple compression fractures. Current Pain Level: 1-10 HISTORY OF PRESENT ILLNESS: The patient is a 72 year old lady who has been referred for evaluation of management of multiple severely painful compression fractures. The patient is accompanied by her daughter who lives with her The patient's symptoms apparently worsened sometime in October when she experienced sudden onset of a nagging pain which gradually got worse over a period of time. Subsequently the pain apparently involved the midthoracic, lower thoracic and upper lumbar spines. Subsequent  workup led to an MRI of the thoracic and lumbar spine. The study revealed multiple compression fractures, some healed and some subacute. Presently, the patient reports a constant gnawing pain with sharp stabs on turning or stooping. The sharp stabbing pain causes the patient to wince and hold her breath. The patient is able to sleep at night after taking her pain medicines. The patient's appetite is severely reduced. Her weight at the present time is 92 pounds, which is down from her usual 131-135 pounds. The patient denies any recent chills, fever or rigors. However, she does complain of burning on urination although she denies any hematuria. Patient has had a previous history of UTIs. Her last urine examination apparently was negative as per her daughter. She denies any autonomic dysfunction of her bowel or bladder activities. There is no radicular distribution of pain into the lower extremities. Past Medical History: Asthma, emphysema related to history of smoking. Had partial liver resection for a cancerous lesion in December of 2016. Has a history of osteoporosis. Medications: Norco 10/325 1 tablet 5 times a day. Vitamin-D capsules. Multi vitamins. Celexa. Ibuprofen. Allergies: Sulfa acetamide  chemicals causes rash. Paxil causes a rash. Amoxapine causes rash. Social History: Has 3 children. Divorced. Denies drinking alcohol or using illicit chemicals. She continues to smoke up to a half pack a day. Family History: Mother apparently had cancer. CVA in her father. Mother also had heart disease as well as hypertension. One family member had rheumatoid arthritis. REVIEW OF SYSTEMS: Negative unless as mentioned above. PHYSICAL EXAMINATION: The patient in obvious moderate severe distress on account of her pain. Fidgety unable to sit still because of the pain in her thoracic and lumbar spines. Otherwise, fairly alert, awake and interacting appropriately. Patient exhibits severe tenderness starting in the midthoracic spine extending to the upper lumbar spine on palpation. Neurologically no gross lateralizing features. Patient is able to stand with wide-based gait because of the significant proximal muscle weakness on account of her having lost weight. ASSESSMENT AND PLAN: The patient's recent MRI of the thoracic and lumbar spines were reviewed with her and her daughter. The sequences were evaluated. She has multiple subacute, chronic and apparently recent fractures. Of note was the hypo intensities seen at the T7, T9, T11 and L3 levels, with associated increased T2 hyperintensity at these levels. Other healed compression fractures are noted in the thoracic spine. The option of vertebral body augmentation at the levels mentioned above initially was presented to the patient and the patient's daughter. The procedure of vertebroplasty/kyphoplasty was reviewed with her. The procedure, the risks, benefits and alternatives were all reviewed. Questions were answered to their satisfaction. The patient and the daughter would like to proceed with the vertebral body augmentation at the levels above. These will be re-evaluated at the time of the procedure. In the meantime she has been advised to refrain from stooping, bending  or lifting weights above 10 pounds. She has also been strongly advised to use a walker all the time. A urinalysis will be undertaken prior to the procedure given the above history of dysuria at this time. They both leave with good understanding and agreement with the above management plan. Electronically Signed   By: Luanne Bras M.D.   On: 03/29/2017 14:50    Labs:  CBC: Recent Labs    10/19/16 1127 12/21/16 1612 12/29/16 1523 01/09/17 1109  WBC 6.1 10.2 12.2* 6.3  HGB 15.0 13.7 13.7 13.6  HCT 45.2 42.1 40.6 41.0  PLT 193 238 304  260.0    COAGS: No results for input(s): INR, APTT in the last 8760 hours.  BMP: Recent Labs    08/25/16 0909  10/19/16 1127 12/21/16 1612 12/22/16 0446 12/29/16 1523  NA 135  --  133* 132* 133* 134*  K 4.8  --  4.6 4.2 3.4* 4.4  CL 99  --   --  94* 91* 94*  CO2 29  --  33* 29 28 31   GLUCOSE 106*  --  98 116* 208* 102*  BUN 13  --  11.6 12 15 14   CALCIUM 9.5  --  9.8 9.0 9.1 9.4  CREATININE 0.56   < > 0.7 0.57 0.73 0.59*  GFRNONAA  --   --   --  >60 >60  --   GFRAA  --   --   --  >60 >60  --    < > = values in this interval not displayed.    LIVER FUNCTION TESTS: Recent Labs    08/25/16 0909 10/19/16 1127 12/22/16 0446 01/09/17 1109  BILITOT 0.6 0.61 0.5 0.6  AST 18 24 48* 22  ALT 14 23 35 19  ALKPHOS 59 79 87 112  PROT 6.4 6.9 6.9 6.9  ALBUMIN 4.2 4.0 3.8 4.3    TUMOR MARKERS: No results for input(s): AFPTM, CEA, CA199, CHROMGRNA in the last 8760 hours.  Assessment and Plan: Compression fractures of T7, T9, T11, and L3 For VP/KP today Labs pending Risks and benefits of kyphoplasty were discussed with the patient including, but not limited to education regarding the natural healing process of compression fractures without intervention, bleeding, infection, cement migration which may cause spinal cord damage, paralysis, pulmonary embolism or even death.  This interventional procedure involves the use of X-rays and  because of the nature of the planned procedure, it is possible that we will have prolonged use of X-ray fluoroscopy.  Potential radiation risks to you include (but are not limited to) the following: - A slightly elevated risk for cancer  several years later in life. This risk is typically less than 0.5% percent. This risk is low in comparison to the normal incidence of human cancer, which is 33% for women and 50% for men according to the [place]. - Radiation induced injury can include skin redness, resembling a rash, tissue breakdown / ulcers and hair loss (which can be temporary or permanent).   The likelihood of either of these occurring depends on the difficulty of the procedure and whether you are sensitive to radiation due to previous procedures, disease, or genetic conditions.   IF your procedure requires a prolonged use of radiation, you will be notified and given written instructions for further action.  It is your responsibility to monitor the irradiated area for the 2 weeks following the procedure and to notify your physician if you are concerned that you have suffered a radiation induced injury.    All of the patient's questions were answered, patient is agreeable to proceed.  Consent signed and in chart.     Thank you for this interesting consult.  I greatly enjoyed meeting Tina Bailey and look forward to participating in their care.  A copy of this report was sent to the requesting provider on this date.  Electronically Signed: Ascencion Dike, PA-C 04/20/2017, 9:32 AM   I spent a total of 20 minutes in face to face in clinical consultation, greater than 50% of which was counseling/coordinating care for KP

## 2017-04-20 NOTE — Sedation Documentation (Signed)
Unable to get acurate BP at this time

## 2017-04-20 NOTE — Sedation Documentation (Signed)
Pt attempted to get on procedural table, pt stated she couldn't breathe- sats R AIR drop low 80's. Pt very agitated and had to put her back on stretcher for safety purposes. Pt sats 90-92% 2L/. MD to speak with pt and family about rescheduling. Pt even attempted 2nd time to get on table but bacame SOB and started panic a little- pt extremely uncomfortable. Concerns about sedation due to pts COPD and SOB lying prone for procedure. Pt asks to stop for today.

## 2017-04-24 ENCOUNTER — Ambulatory Visit: Payer: PPO | Admitting: Hematology

## 2017-04-24 ENCOUNTER — Other Ambulatory Visit: Payer: PPO

## 2017-04-24 ENCOUNTER — Encounter: Payer: Self-pay | Admitting: General Practice

## 2017-04-24 NOTE — Progress Notes (Signed)
[facility] Spiritual Care Note  Returned a call from [place] this morning, speaking for ca 20 min. Per pt, she is realizing that she has not processed certain events, behaviors, and relationships from her past, and she wondered whether a counselor might be helpful in this week. Normalized counseling and value of a skilled professional conversation partner who is trained to support clients in self-reflection, identifying/changing behavior patterns, and healing from past hurts, and who feels like a good fit to the client.   Shenica verbalized gratitude for God's opening her awareness, for her emerging skills in "verbalizing what [she previously] held back," and for ongoing Spiritual Care availability. She knows to contact chaplain anytime with questions or for spiritual/emotional support.   [place], [place], [facility] Pager [phone] Voicemail [phone]

## 2017-04-28 ENCOUNTER — Ambulatory Visit (INDEPENDENT_AMBULATORY_CARE_PROVIDER_SITE_OTHER): Payer: Medicare Other | Admitting: Physician Assistant

## 2017-04-28 ENCOUNTER — Other Ambulatory Visit: Payer: Self-pay

## 2017-04-28 ENCOUNTER — Encounter: Payer: Self-pay | Admitting: Physician Assistant

## 2017-04-28 VITALS — BP 130/70 | HR 92 | Temp 97.8°F | Resp 16 | Ht 63.0 in | Wt 92.0 lb

## 2017-04-28 DIAGNOSIS — R399 Unspecified symptoms and signs involving the genitourinary system: Secondary | ICD-10-CM

## 2017-04-28 LAB — POCT URINALYSIS DIPSTICK
Bilirubin, UA: NEGATIVE
Blood, UA: NEGATIVE
Glucose, UA: NEGATIVE
Ketones, UA: NEGATIVE
Leukocytes, UA: NEGATIVE
Nitrite, UA: NEGATIVE
Protein, UA: NEGATIVE
Spec Grav, UA: 1.015 (ref 1.010–1.025)
Urobilinogen, UA: 0.2 E.U./dL
pH, UA: 7.5 (ref 5.0–8.0)

## 2017-04-28 MED ORDER — CEPHALEXIN 500 MG PO CAPS: 500.0000 mg | ORAL_CAPSULE | Freq: Two times a day (BID) | ORAL | 0 refills | Status: AC

## 2017-04-28 NOTE — Patient Instructions (Signed)
Your symptoms are consistent with a bladder infection, also called acute cystitis. Please take your antibiotic (Keflex) as directed until all pills are gone.  Stay very well hydrated.  Consider a daily probiotic (Align, Culturelle, or Activia) to help prevent stomach upset caused by the antibiotic.  Taking a probiotic daily may also help prevent recurrent UTIs.  Also consider taking AZO (Phenazopyridine) tablets to help decrease pain with urination.  I will call you with your urine testing results.  We will change antibiotics if indicated.  Call or return to clinic if symptoms are not resolved by completion of antibiotic.   Urinary Tract Infection A urinary tract infection (UTI) can occur any place along the urinary tract. The tract includes the kidneys, ureters, bladder, and urethra. A type of germ called bacteria often causes a UTI. UTIs are often helped with antibiotic medicine.  HOME CARE   If given, take antibiotics as told by your doctor. Finish them even if you start to feel better.  Drink enough fluids to keep your pee (urine) clear or pale yellow.  Avoid tea, drinks with caffeine, and bubbly (carbonated) drinks.  Pee often. Avoid holding your pee in for a long time.  Pee before and after having sex (intercourse).  Wipe from front to back after you poop (bowel movement) if you are a woman. Use each tissue only once. GET HELP RIGHT AWAY IF:   You have back pain.  You have lower belly (abdominal) pain.  You have chills.  You feel sick to your stomach (nauseous).  You throw up (vomit).  Your burning or discomfort with peeing does not go away.  You have a fever.  Your symptoms are not better in 3 days. MAKE SURE YOU:   Understand these instructions.  Will watch your condition.  Will get help right away if you are not doing well or get worse. Document Released: 08/24/2007 Document Revised: 11/30/2011 Document Reviewed: 10/06/2011 Exit[employer] Patient Information 2015  [employer]. This information is not intended to replace advice given to you by your health care provider. Make sure you discuss any questions you have with your health care provider.   

## 2017-04-29 LAB — URINE CULTURE
MICRO NUMBER:: 90173116
Result:: NO GROWTH
SPECIMEN QUALITY:: ADEQUATE

## 2017-04-29 NOTE — Progress Notes (Signed)
Patient presents to clinic today c/o 1 day of dysuria, urinary urgency and frequency along with suprapubic pressure. Dneies fever, chills, nausea or vomiting. Has chronic back pain and denies any change in this. Denies hematuria or flank pain. Has tried to stay well-hydrated.   Past Medical History:  Diagnosis Date  . Arthritis    DDD. Right shoulder"is frozen"-limited ROM. osteoporosis.  . Blue toe syndrome ([place]) 11/27/2014   Dr. Claudia Pollock evaluating  . Cancer ([place]) 12/2014   liver cancer  . Complication of anesthesia   . COPD (chronic obstructive pulmonary disease) ([place])   . Fibromyalgia   . Fracture of rib of right side    hx "osteoporosis"- states her dog nudge her on the side, next day developed great pain and was told has a fracture rib.  . Macular degeneration    R eye  . Osteoporosis   . PONV (postoperative nausea and vomiting)    nausea,severe vomiting after 01-13-15 portal vein embolization  . Productive cough   . Retina disorder    L eye, vision distorted, edema  . Spine fracture due to birth trauma   . TMJ disease   . Wears glasses     Current Outpatient Medications on File Prior to Visit  Medication Sig Dispense Refill  . albuterol (PROAIR HFA) 108 (90 Base) MCG/ACT inhaler Inhale 1-2 puffs into the lungs every 6 (six) hours as needed for shortness of breath.     . bisacodyl (DULCOLAX) 5 MG EC tablet Take 10 mg by mouth daily as needed for moderate constipation.    . Calcium Carbonate-Vitamin D (CALCIUM 500+D HIGH POTENCY PO) Take 1 tablet by mouth daily.     . Carboxymethylcellul-Glycerin (CLEAR EYES FOR DRY EYES) 1-0.25 % SOLN Place 1-2 drops into both eyes daily as needed.    . Cholecalciferol (VITAMIN D3) 5000 units CAPS Take 5,000 Units by mouth daily.    . Cyanocobalamin (VITAMIN B-12) 1000 MCG SUBL Place 1,000 mcg under the tongue daily.     . naproxen sodium (ALEVE) 220 MG tablet Take 440 mg by mouth 2 (two) times daily with a meal.    . oxyCODONE (OXY  IR/ROXICODONE) 5 MG immediate release tablet TAKE 1 TABLET(S) 5 TIMES A DAY BY ORAL ROUTE AS NEEDED.  0  . QUEtiapine (SEROQUEL) 25 MG tablet Take 25 mg by mouth at bedtime.     No current facility-administered medications on file prior to visit.     Allergies  Allergen Reactions  . Amoxicillin-Pot Clavulanate Nausea Only    Has patient had a PCN reaction causing immediate rash, facial/tongue/throat swelling, SOB or lightheadedness with hypotension: No Has patient had a PCN reaction causing severe rash involving mucus membranes or skin necrosis: No Has patient had a PCN reaction that required hospitalization No Has patient had a PCN reaction occurring within the last 10 years: No If all of the above answers are "NO", then may proceed with Cephalosporin use.   . Clindamycin/Lincomycin Other (See Comments)    Felt like it "burned out" her stomach/took a large dose  . Paroxetine Hcl Rash  . Sulfa Antibiotics Rash    Family History  Problem Relation Age of Onset  . Hypertension Mother   . Atrial fibrillation Mother   . Heart disease Mother        after age 80  . Stroke Father   . Dementia Father   . Heart disease Brother        After age 58- A-Fib  .  Heart attack Brother     Social History   Socioeconomic History  . Marital status: Divorced    Spouse name: None  . Number of children: None  . Years of education: None  . Highest education level: None  Social Needs  . Financial resource strain: None  . Food insecurity - worry: None  . Food insecurity - inability: None  . Transportation needs - medical: None  . Transportation needs - non-medical: None  Occupational History  . None  Tobacco Use  . Smoking status: Current Every Day Smoker    Packs/day: 0.50    Years: 45.00    Pack years: 22.50    Types: Cigarettes    Last attempt to quit: 10/23/2016    Years since quitting: 0.5  . Smokeless tobacco: Never Used  Substance and Sexual Activity  . Alcohol use: No     Alcohol/week: 0.0 oz  . Drug use: No  . Sexual activity: None  Other Topics Concern  . None  Social History Narrative  . None    Review of Systems - See HPI.  All other ROS are negative.  BP 130/70   Pulse 92   Temp 97.8 F (36.6 C) (Oral)   Resp 16   Ht 5' 3"  (1.6 m)   Wt 92 lb (41.7 kg)   SpO2 91%   BMI 16.30 kg/m   Physical Exam  Constitutional: She is well-developed, well-nourished, and in no distress.  HENT:  Head: Normocephalic and atraumatic.  Eyes: Conjunctivae are normal.  Neck: Neck supple.  Cardiovascular: Normal rate, regular rhythm, normal heart sounds and intact distal pulses.  Pulmonary/Chest: Effort normal.  Abdominal: Soft. Bowel sounds are normal. She exhibits no distension and no mass. There is tenderness in the suprapubic area. There is no rebound and no guarding.  Skin: Skin is warm and dry. No rash noted.  Psychiatric: Affect normal.  Vitals reviewed.  Recent Results (from the past 2160 hour(s))  APTT     Status: None   Collection Time: 04/20/17  9:15 AM  Result Value Ref Range   aPTT 33 24 - 36 seconds  Basic metabolic panel     Status: Abnormal   Collection Time: 04/20/17  9:15 AM  Result Value Ref Range   Sodium 133 (L) 135 - 145 mmol/L   Potassium 4.8 3.5 - 5.1 mmol/L    Comment: SLIGHT HEMOLYSIS   Chloride 96 (L) 101 - 111 mmol/L   CO2 28 22 - 32 mmol/L   Glucose, Bld 97 65 - 99 mg/dL   BUN 11 6 - 20 mg/dL   Creatinine, Ser 0.54 0.44 - 1.00 mg/dL   Calcium 9.1 8.9 - 10.3 mg/dL   GFR calc non Af Amer >60 >60 mL/min   GFR calc Af Amer >60 >60 mL/min    Comment: (NOTE) The eGFR has been calculated using the CKD EPI equation. This calculation has not been validated in all clinical situations. eGFR's persistently <60 mL/min signify possible Chronic Kidney Disease.    Anion gap 9 5 - 15  CBC     Status: None   Collection Time: 04/20/17  9:15 AM  Result Value Ref Range   WBC 5.7 4.0 - 10.5 K/uL   RBC 4.82 3.87 - 5.11 MIL/uL    Hemoglobin 15.0 12.0 - 15.0 g/dL   HCT 45.4 36.0 - 46.0 %   MCV 94.2 78.0 - 100.0 fL   MCH 31.1 26.0 - 34.0 pg   MCHC 33.0 30.0 -  36.0 g/dL   RDW 12.7 11.5 - 15.5 %   Platelets 222 150 - 400 K/uL  Protime-INR     Status: None   Collection Time: 04/20/17  9:15 AM  Result Value Ref Range   Prothrombin Time 13.1 11.4 - 15.2 seconds   INR 1.00   POCT Urinalysis Dipstick     Status: Normal   Collection Time: 04/28/17  4:21 PM  Result Value Ref Range   Color, UA yellow    Clarity, UA clear    Glucose, UA negative    Bilirubin, UA negative    Ketones, UA negative    Spec Grav, UA 1.015 1.010 - 1.025   Blood, UA negative    pH, UA 7.5 5.0 - 8.0   Protein, UA negative    Urobilinogen, UA 0.2 0.2 or 1.0 E.U./dL   Nitrite, UA negative    Leukocytes, UA Negative Negative   Appearance     Odor      Assessment/Plan: 1. UTI symptoms Urine dip unremarkable. Classic symptoms and + history. Giving that it is Friday afternoon, will start empiric treatment with Keflex. Culture sent. Supportive measures and OTC medications reviewed.  - POCT Urinalysis Dipstick - Urine Culture   Leeanne Rio, PA-C

## 2017-05-04 NOTE — Telephone Encounter (Signed)
Patient notified of PCP recommendations and is agreement and expresses an understanding. Will call Dr. Nelva Bush first to discuss.    [place] for [facility] to Discuss results / PCP recommendations / Schedule patient.

## 2017-05-04 NOTE — Telephone Encounter (Signed)
Please advise, last OV 01/09/17 for a CPE.    Copied from [place] [id number]. Topic: General - Other >> May 04, 2017 12:00 PM Cecelia Byars, NT wrote: Reason for OOI:[id number] would like a prescription / or order for a walker with wheels and a chair made on it please advise  [phone]

## 2017-05-04 NOTE — Telephone Encounter (Signed)
Pt sees a specialist for her back (Dr Nelva Bush), Rheumatology, and Oncology- all of whom are better equipped to order this as I have not seen her recently

## 2017-05-12 ENCOUNTER — Ambulatory Visit (INDEPENDENT_AMBULATORY_CARE_PROVIDER_SITE_OTHER): Payer: Medicare Other | Admitting: Internal Medicine

## 2017-05-12 ENCOUNTER — Encounter: Payer: Self-pay | Admitting: Internal Medicine

## 2017-05-12 VITALS — BP 140/80 | HR 93 | Ht 63.0 in | Wt 92.0 lb

## 2017-05-12 DIAGNOSIS — F17210 Nicotine dependence, cigarettes, uncomplicated: Secondary | ICD-10-CM

## 2017-05-12 DIAGNOSIS — J449 Chronic obstructive pulmonary disease, unspecified: Secondary | ICD-10-CM

## 2017-05-12 DIAGNOSIS — J9601 Acute respiratory failure with hypoxia: Secondary | ICD-10-CM

## 2017-05-12 LAB — PULMONARY FUNCTION TEST
FEF 25-75 Post: 0.28 L/sec
FEF 25-75 Pre: 0.25 L/sec
FEF2575-%Change-Post: 13 %
FEF2575-%Pred-Post: 16 %
FEF2575-%Pred-Pre: 14 %
FEV1-%Change-Post: 7 %
FEV1-%Pred-Post: 27 %
FEV1-%Pred-Pre: 25 %
FEV1-Post: 0.58 L
FEV1-Pre: 0.54 L
FEV1FVC-%Change-Post: 2 %
FEV1FVC-%Pred-Pre: 57 %
FEV6-%Change-Post: 5 %
FEV6-%Pred-Post: 48 %
FEV6-%Pred-Pre: 45 %
FEV6-Post: 1.28 L
FEV6-Pre: 1.21 L
FEV6FVC-%Change-Post: 0 %
FEV6FVC-%Pred-Post: 103 %
FEV6FVC-%Pred-Pre: 102 %
FVC-%Change-Post: 4 %
FVC-%Pred-Post: 46 %
FVC-%Pred-Pre: 44 %
FVC-Post: 1.3 L
FVC-Pre: 1.24 L
Post FEV1/FVC ratio: 44 %
Post FEV6/FVC ratio: 98 %
Pre FEV1/FVC ratio: 43 %
Pre FEV6/FVC Ratio: 98 %

## 2017-05-12 MED ORDER — GLYCOPYRROLATE-FORMOTEROL 9-4.8 MCG/ACT IN AERO: 2.0000 | INHALATION_SPRAY | Freq: Two times a day (BID) | RESPIRATORY_TRACT | 11 refills | Status: DC

## 2017-05-12 MED ORDER — GLYCOPYRROLATE-FORMOTEROL 9-4.8 MCG/ACT IN AERO: 2.0000 | INHALATION_SPRAY | Freq: Two times a day (BID) | RESPIRATORY_TRACT | 0 refills | Status: DC

## 2017-05-12 NOTE — Progress Notes (Signed)
PFT completed today.  

## 2017-05-12 NOTE — Patient Instructions (Addendum)
The key is to stop smoking completely before smoking completely stops you!   Plan A = Automatic = Bevespi Take 2 puffs first thing in am and then another 2 puffs about 12 hours later.    Work on inhaler technique:  relax and gently blow all the way out then take a nice smooth deep breath back in, triggering the inhaler at same time you start breathing in.  Blow out thru nose. Rinse and gargle with water when done      Plan B = Backup Only use your albuterol (PROAIR) as a rescue medication to be used if you can't catch your breath by resting or doing a relaxed purse lip breathing pattern.  - The less you use it, the better it will work when you need it. - Ok to use the inhaler up to 2 puffs  every 4 hours if you must but call for appointment if use goes up over your usual need - Don't leave home without it !!  (think of it like the spare tire for your car)     Plan C = Crisis - only use your albuterol nebulizer if you first try Plan B and it fails to help > ok to use the nebulizer up to every 4 hours but if start needing it regularly call for immediate appointment  We will cancel your 02  You cleared for surgery with acceptable risk

## 2017-05-12 NOTE — Progress Notes (Signed)
Subjective:    Patient ID: Tina Bailey, female    DOB: 04-08-1945,    MRN: [id number]    Brief patient profile: 79 yowf active smoker with mostly c/o chronic cough x years attributed to pnds with dx of copd in 2008 but not on resp rx and referred to pulmonary clinic 03/03/2015 by Dr Barry Dienes with GOLD III criteria on spirometry     History of Present Illness  03/03/2015 1st [facility] office visit/ [facility]   Chief Complaint  Patient presents with  . PULMONARY CONSULT    Referred by Dr. Barry Dienes. Pt needs surgical clearance for liver resection tomorrow. Pt was told she has "mild" emphysema. Pt c/o occasional wheeze, cough with creamy thick mucus and some sinus/nasal congestion and drainage. Pt does smoke. Pt does not use inhalers.   cough x years esp in am's but Not limited by breathing from desired activities  Able to shop ok- no hcp Total amt of mucus typically < 2 tsp / white/ not usually bloody or purulent nor is it now rec You have moderately severe copd and need to quit smoking now but it is not prohibitive to surgery on your liver You are taking a risk by smoking up until the day of surgery > ideally you need to stop 2 weeks prior if at all possible to improve cough mechanics/ reduce sputum production  We can use duoneb (breathing treatments) perioperatively if needed     03/04/15  Liver surgery for hepatocellular ca / no chemo/RT but chronic pain since then     02/02/2017  f/u ov/[facility] re:  GOLD III/ still smoking  Chief Complaint  Patient presents with  . Pulmonary Consult    Referred by Dr. Annye Asa for eval of hypoxia. Pt states that she was admitted to the hospital for "a light touch of bronchitis" and CP 12/21/16-12/22/16 and was sent home with o2. She has not used o2 recently b/c she feels it's not needed.   can do [employer] leaning on cart stopping freq = MMRC3 = can't walk 100 yards even at a slow pace at a flat grade s stopping due to sob s 02 No 02  sleeps ok / but sats drop to 86% s 02 when she wakes up  Not using any maintenance  resp medications Uses neb qod on avg rec The key is to stop smoking completely before smoking completely stops you!  Start Trelegy one click  - two good drags first thing in am 02 rec is 2lpm at bedtime and goal for daytime is keep over 90% at all times so as improve probably won't need daytime full time    05/12/2017  f/u ov/[facility] re:  GOLD IV  / has 02 not using  Chief Complaint  Patient presents with  . Follow-up    PFT's done today. She states her breathing is back at her normal baseline. She rarely uses her proair.   Dyspnea:  MMRC3 = can't walk 100 yards even at a slow pace at a flat grade s stopping due to sob  Cough: randomly coughs up tsp - tbps slt yellow x few weeks Sleep: fine  Flat most nights Uses neb 3 x weekly but very rarely any proair Feels trelegy poweder choking her No longer using 02 and wants it out of her house    No obvious day to day or daytime variability or assoc   mucus plugs or hemoptysis or cp or chest tightness, subjective wheeze or  overt sinus or hb symptoms. No unusual exposure hx or h/o childhood pna/ asthma or knowledge of premature birth.  Sleeping ok flat without nocturnal  or early am exacerbation  of respiratory  c/o's or need for noct saba. Also denies any obvious fluctuation of symptoms with weather or environmental changes or other aggravating or alleviating factors except as outlined above   Current Allergies, Complete Past Medical History, Past Surgical History, Family History, and Social History were reviewed in [employer] record.  ROS  The following are not active complaints unless bolded Hoarseness, sore throat, dysphagia, dental problems, itching, sneezing,  nasal congestion or discharge of excess mucus or purulent secretions, ear ache,   fever, chills, sweats, unintended wt loss or wt gain, classically pleuritic or exertional cp,   orthopnea pnd or leg swelling, presyncope, palpitations, abdominal pain, anorexia, nausea, vomiting, diarrhea  or change in bowel habits or change in bladder habits, change in stools or change in urine, dysuria, hematuria,  rash, arthralgias, visual complaints, headache, numbness, weakness or ataxia or problems with walking or coordination,  change in mood/affect or memory.        Current Meds  Medication Sig  . albuterol (PROAIR HFA) 108 (90 Base) MCG/ACT inhaler Inhale 1-2 puffs into the lungs every 6 (six) hours as needed for shortness of breath.   . bisacodyl (DULCOLAX) 5 MG EC tablet Take 10 mg by mouth daily as needed for moderate constipation.  . Calcium Carbonate-Vitamin D (CALCIUM 500+D HIGH POTENCY PO) Take 1 tablet by mouth daily.   . Carboxymethylcellul-Glycerin (CLEAR EYES FOR DRY EYES) 1-0.25 % SOLN Place 1-2 drops into both eyes daily as needed.  . Cholecalciferol (VITAMIN D3) 5000 units CAPS Take 5,000 Units by mouth daily.  . Cyanocobalamin (VITAMIN B-12) 1000 MCG SUBL Place 1,000 mcg under the tongue daily.   Marland Kitchen ipratropium-albuterol (DUONEB) 0.5-2.5 (3) MG/3ML SOLN Take 3 mLs by nebulization every 4 (four) hours as needed.  . naproxen sodium (ALEVE) 220 MG tablet Take 440 mg by mouth 2 (two) times daily with a meal.  . oxyCODONE (OXY IR/ROXICODONE) 5 MG immediate release tablet TAKE 1 TABLET(S) 5 TIMES A DAY BY ORAL ROUTE AS NEEDED.  . QUEtiapine (SEROQUEL) 25 MG tablet Take 25 mg by mouth at bedtime.                Objective:   Physical Exam    amb mod hoarse wf nad   05/12/2017        92   02/02/2017     98   03/03/15 109 lb 6.4 oz (49.624 kg)  02/26/15 110 lb (49.896 kg)  01/13/15 114 lb (51.71 kg)     Vital signs reviewed - Note on arrival 02 sats  91% on RA          HEENT: nl dentition, turbinates bilaterally, and oropharynx. Nl external ear canals without cough reflex   NECK :  without JVD/Nodes/TM/ nl carotid upstrokes bilaterally   LUNGS: no  acc muscle use,   Barrel contour chest with distant bs/min exp rhonchi/  no wheeze/ hyperresonant to percussion   CV:  RRR  no s3 or murmur or increase in P2, and no edema   ABD:  soft and nontender with nl inspiratory excursion in the supine position. No bruits or organomegaly appreciated, bowel sounds nl  MS:  Nl gait/ ext warm without deformities, calf tenderness, cyanosis or clubbing Moderate kyphosis    SKIN: warm and dry without lesions  NEURO:  alert, approp, nl sensorium with  no motor or cerebellar deficits apparent.             Assessment & Plan:

## 2017-05-13 ENCOUNTER — Encounter: Payer: Self-pay | Admitting: Internal Medicine

## 2017-05-13 NOTE — Assessment & Plan Note (Signed)
>   3 min  Matter of life and breath at this point > promises to keep working on stopping

## 2017-05-13 NOTE — Assessment & Plan Note (Signed)
Refusing to wear 02 at this point

## 2017-05-13 NOTE — Assessment & Plan Note (Signed)
Spirometry 03/03/2015   FEV1  1.04 (44%) ratio 58  - 03/03/2015  Walked RA x 3 laps @ 185 ft each stopped due to  End of study, brisk pace, no sob or desat   -  02/02/2017   Walked RA x one lap @ 185 stopped due to  Back pain/ sob no desats @ nl pace - 02/02/2017  After extensive coaching device effectiveness =    90% with DPI > try trelegy x one month then return for pfts  - Spirometry 05/12/2017  FEV1 0.58 (27%)  Ratio 44  With trelegy prior and no resp to saba  - 05/12/2017  After extensive coaching inhaler device  effectiveness =    75% try bevespi instead of trelegy due to throat irritation     DDX of  difficult airways management almost all start with A and  include Adherence, Ace Inhibitors, Acid Reflux, Active Sinus Disease, Alpha 1 Antitripsin deficiency, Anxiety masquerading as Airways dz,  ABPA,  Allergy(esp in young), Aspiration (esp in elderly), Adverse effects of meds,  Active smokers, A bunch of PE's (a small clot burden can't cause this syndrome unless there is already severe underlying pulm or vascular dz with poor reserve) plus two Bs  = Bronchiectasis and Beta blocker use..and one C= CHF  Adherence is always the initial "prime suspect" and is a multilayered concern that requires a "trust but verify" approach in every patient - starting with knowing how to use medications, especially inhalers, correctly, keeping up with refills and understanding the fundamental difference between maintenance and prns vs those medications only taken for a very short course and then stopped and not refilled.  - see hfa teaching - return with all meds in hand using a trust but verify approach to confirm accurate Medication  Reconciliation The principal here is that until we are certain that the  patients are doing what we've asked, it makes no sense to ask them to do more.   Active smoking greatest concern >  See sep a/p  ? Adverse effects of dpi > try hfa trial basis  Pt is Group B in terms of  symptom/risk and laba/lama therefore appropriate rx at this point so will try bevespi   I had an extended discussion with the patient reviewing all relevant studies completed to date and  lasting 15 to 20 minutes of a 25 minute visit    Each maintenance medication was reviewed in detail including most importantly the difference between maintenance and prns and under what circumstances the prns are to be triggered using an action plan format that is not reflected in the computer generated alphabetically organized AVS.    Please see AVS for specific instructions unique to this visit that I personally wrote and verbalized to the the pt in detail and then reviewed with pt  by my nurse highlighting any  changes in therapy recommended at today's visit to their plan of care.

## 2017-05-24 ENCOUNTER — Telehealth: Payer: Self-pay | Admitting: Internal Medicine

## 2017-05-24 NOTE — Telephone Encounter (Signed)
Ok to write letter or just epic note as I addressed this at her last ov under instructions

## 2017-05-24 NOTE — Telephone Encounter (Signed)
Called and spoke with patients daughter, advised her that we will fax the last OV note to the doctors office for surgical clearance. Nothing further needed.

## 2017-05-24 NOTE — Telephone Encounter (Signed)
Called and spoke with patients daughter, she states that patient is having a verteboplasty done. The doctor doing it is wanting surgical clearance due to patient needing to placed under anesthesia. The surgical clearance needs to state that with her age and medical history she is ok to undergo this. MW please advise if this is ok.

## 2017-05-25 ENCOUNTER — Telehealth: Payer: Self-pay | Admitting: Internal Medicine

## 2017-05-25 DIAGNOSIS — J449 Chronic obstructive pulmonary disease, unspecified: Secondary | ICD-10-CM

## 2017-05-25 NOTE — Telephone Encounter (Signed)
Called and spoke to pt's daughter, [name] ([place]). [name] is requesting that a order be placed to [facility] to d/c pt's home O2. Order has been placed based off of 05/12/17 OV note. Nothing further is needed   2. Tanda Rockers, MD (Physician) at 05/12/2017 4:42 PM - Signed    The key is to stop smoking completely before smoking completely stops you!   Plan A = Automatic = Bevespi Take 2 puffs first thing in am and then another 2 puffs about 12 hours later.    Work on inhaler technique:  relax and gently blow all the way out then take a nice smooth deep breath back in, triggering the inhaler at same time you start breathing in.  Blow out thru nose. Rinse and gargle with water when done      Plan B = Backup Only use your albuterol (PROAIR) as a rescue medication to be used if you can't catch your breath by resting or doing a relaxed purse lip breathing pattern.  - The less you use it, the better it will work when you need it. - Ok to use the inhaler up to 2 puffs  every 4 hours if you must but call for appointment if use goes up over your usual need - Don't leave home without it !!  (think of it like the spare tire for your car)     Plan C = Crisis - only use your albuterol nebulizer if you first try Plan B and it fails to help > ok to use the nebulizer up to every 4 hours but if start needing it regularly call for immediate appointment  We will cancel your 02  You cleared for surgery with acceptable risk          Instructions   Patient Instructions

## 2017-05-30 ENCOUNTER — Telehealth: Payer: Self-pay | Admitting: Internal Medicine

## 2017-05-30 MED ORDER — GLYCOPYRROLATE-FORMOTEROL 9-4.8 MCG/ACT IN AERO: 2.0000 | INHALATION_SPRAY | Freq: Two times a day (BID) | RESPIRATORY_TRACT | 11 refills | Status: DC

## 2017-05-30 NOTE — Telephone Encounter (Signed)
Spoke with pt. She is wanting a prescription for Bevespi. States she was given a prescription when she was here but she doesn't want to have to take that to pharmacy. Rx has been sent in. Nothing further was needed.

## 2017-06-01 ENCOUNTER — Ambulatory Visit: Payer: Medicare Other | Admitting: Family

## 2017-06-01 ENCOUNTER — Encounter (HOSPITAL_COMMUNITY): Payer: Medicare Other

## 2017-06-06 ENCOUNTER — Telehealth: Payer: Self-pay | Admitting: General Practice

## 2017-06-06 NOTE — Telephone Encounter (Signed)
Please advise, there are no notes in the chart before today.   Copied from [place]. Topic: Quick Communication - See Telephone Encounter >> Jun 06, 2017  9:05 AM Oneta Rack wrote: Caller name: Maudie Mercury  Relation to pt: [place]  Call back number:[phone] ext [phone] fax # [phone]   Reason for call:  Checking on the status of medical necessity form regarding patient nebulizer supplies faxed on 3/12/190 and states will expire on 06/20/17, please advise  >> Jun 06, 2017  9:16 AM Oneta Rack wrote: Osvaldo Human name: Maudie Mercury  Relation to pt: [place]  Call back number:[phone] fax # [phone]   Reason for call:  Checking on the status of medical necessity form regarding patient nebulizer supplies faxed on 3/12/190 and states will expire on 06/20/17, please advise

## 2017-06-07 NOTE — Telephone Encounter (Signed)
I am routing this message to Pulmonology per PCP. Have you received this paperwork?

## 2017-06-07 NOTE — Telephone Encounter (Signed)
This appears to be from Dr Melvyn Novas.  That would need to go through him

## 2017-06-08 NOTE — Telephone Encounter (Signed)
We have received the paperwork.    Tina Bailey has left for Dr. Melvyn Novas to address, but Dr. Melvyn Novas is out of town until Monday 3.25.19.  Nothing further is needed.

## 2017-06-13 ENCOUNTER — Telehealth: Payer: Self-pay | Admitting: Internal Medicine

## 2017-06-13 NOTE — Telephone Encounter (Signed)
lmtcb x1 

## 2017-06-14 NOTE — Telephone Encounter (Signed)
Spoke with Tina Bailey. She stated that she had already received the information that she needed from [place] this morning.   Nothing else needed at time of call.

## 2017-06-22 DIAGNOSIS — J441 Chronic obstructive pulmonary disease with (acute) exacerbation: Secondary | ICD-10-CM | POA: Diagnosis not present

## 2017-07-10 ENCOUNTER — Ambulatory Visit (HOSPITAL_COMMUNITY)
Admission: RE | Admit: 2017-07-10 | Discharge: 2017-07-10 | Disposition: A | Discharge status: Home / Self Care | Payer: Medicare Other | Source: Ambulatory Visit | Attending: Family | Admitting: Family

## 2017-07-10 ENCOUNTER — Ambulatory Visit (INDEPENDENT_AMBULATORY_CARE_PROVIDER_SITE_OTHER): Payer: Medicare Other | Admitting: Family

## 2017-07-10 ENCOUNTER — Encounter: Payer: Self-pay | Admitting: Family

## 2017-07-10 ENCOUNTER — Other Ambulatory Visit: Payer: Self-pay

## 2017-07-10 VITALS — BP 112/69 | HR 83 | Temp 98.0°F | Resp 16 | Ht 63.0 in | Wt 91.0 lb

## 2017-07-10 DIAGNOSIS — F17200 Nicotine dependence, unspecified, uncomplicated: Secondary | ICD-10-CM | POA: Insufficient documentation

## 2017-07-10 DIAGNOSIS — I779 Disorder of arteries and arterioles, unspecified: Secondary | ICD-10-CM | POA: Insufficient documentation

## 2017-07-10 DIAGNOSIS — I75022 Atheroembolism of left lower extremity: Secondary | ICD-10-CM | POA: Diagnosis not present

## 2017-07-10 NOTE — Progress Notes (Signed)
[facility] [place]   CC: Follow up peripheral artery occlusive disease  History of Present Illness Tina Bailey is a 72 y.o. female whom Dr. Oneida Alar has evaluated for pain and bluish discoloration left fifth toe. The patient began to have pain in her left fifth toe about June of 2016. Toe was hurting primarily in the morning time. She denies claudication symptoms. She denies any prior episodes. She denies family history of abdominal aortic aneurysm. She denies history of diabetes. Other medical problems include COPD, arthritis, coronary artery disease area all of these are currently stable.  Dr. Oneida Alar last saw pt on 12/04/14. At that time CT angiogram of the abdomen and pelvis with runoff was reviewed. There is a 50% stenosis of the left superficial femoral artery. Otherwise her arterial tree is without any significant pathology. However, a mass was detected in the central aspect of her liver. This was approximate 5-6 cm in diameter. A follow-up MRI of the abdomen was performed which suggests that this mass may be malignant. There were also several masses noted in the tail of the pancreas. Improved left fifth toe. In light of the fact that the plaque in her left SFA seemed fairly benign Dr. Oneida Alar believes the best option at this point would be dual antiplatelets therapy with Plavix and aspirin for 6 months. If her symptoms are overall stable at the end of 6 months we could switch back to just aspirin alone. Dr. Oneida Alar prescriped Plavix but told her not to fill this until after she is seen by the oncology doctors as she will most likely need a biopsy of her liver. After the workup of her liver/pancreas mass is concluded she could start her Plavix at that point. If the patient has an additional embolic event she may need to be considered for left superficial femoral artery stenting.  Pt returns today for follow up with ABI's.  She had liver resection in December of 2016,  pt states half of her liver was removed. Her stamina has been decreased from this and does not walk much.  She denies claudication sx's with walking. She states the blue color in her left 5th toe has resolved.   She reports she had several fractures, diagnosed with osteoporosis, took Forteo, stopped March 2018.   Her walking is limited by dyspnea and fatigue, difficulty standing up.   She is under pain management care for vertebral compression fractures related pain. She has had chronic pain since her 56's with fibromyalgia.  She has been "laid up" since December 2018 with worsening pain in her back, which has improved lately, and recently she has been using a rolling walker with a seat, and is able to walk more since her back pain is now under control   Pt denies any hx of stroke or TIA, denies any cardiac issues.  Pt Diabetic: No Pt smoker: smoker (1/2 ppd, decreased from 1-2 ppd, started at age 77 yrs)  Pt meds include: Statin :No Betablocker: No ASA: Yes Other anticoagulants/antiplatelets: no, pt states her oncologist and surgeon advised her not to take Plavix      Past Medical History:  Diagnosis Date  . Arthritis    DDD. Right shoulder"is frozen"-limited ROM. osteoporosis.  . Blue toe syndrome ([place]) 11/27/2014   Dr. Claudia Pollock evaluating  . Cancer ([place]) 12/2014   liver cancer  . Complication of anesthesia   . COPD (chronic obstructive pulmonary disease) ([place])   . Fibromyalgia   . Fracture of rib  of right side    hx "osteoporosis"- states her dog nudge her on the side, next day developed great pain and was told has a fracture rib.  . Macular degeneration    R eye  . Osteoporosis   . PONV (postoperative nausea and vomiting)    nausea,severe vomiting after 01-13-15 portal vein embolization  . Productive cough   . Retina disorder    L eye, vision distorted, edema  . Spine fracture due to birth trauma   . TMJ disease   . Wears glasses     Social  History Social History   Tobacco Use  . Smoking status: Current Every Day Smoker    Packs/day: 0.50    Years: 45.00    Pack years: 22.50    Types: Cigarettes  . Smokeless tobacco: Never Used  Substance Use Topics  . Alcohol use: No    Alcohol/week: 0.0 oz  . Drug use: No    Family History Family History  Problem Relation Age of Onset  . Hypertension Mother   . Atrial fibrillation Mother   . Heart disease Mother        after age 72  . Stroke Father   . Dementia Father   . Heart disease Brother        After age 57- A-Fib  . Heart attack Brother     Past Surgical History:  Procedure Laterality Date  . ANGIOPLASTY  2008   no stents required, no follow-up with cardiologist, no recurrent chest pain  . CATARACT EXTRACTION, BILATERAL Bilateral   . ESOPHAGOGASTRODUODENOSCOPY (EGD) WITH PROPOFOL N/A 06/25/2015   Procedure: ESOPHAGOGASTRODUODENOSCOPY (EGD) WITH PROPOFOL;  Surgeon: Milus Banister, MD;  Location: [facility];  Service: Endoscopy;  Laterality: N/A;  stent removal   . EYE SURGERY     cornea surgery  . IR RADIOLOGIST EVAL & MGMT  03/29/2017  . KIDNEY STONE SURGERY    . LAPAROSCOPIC PARTIAL HEPATECTOMY N/A 03/04/2015   Procedure: DIAGNOSTIC LAPAROSCOPY, EXTENDED RIGHT HEPATECTOMY, WITH INTRAOPERATIVE ULTRASOUND;  Surgeon: Stark Klein, MD;  Location: [facility];  Service: General;  Laterality: N/A;  . PARTIAL HYSTERECTOMY    . portal vein embolization     01-13-15 -Dr. Cathlean Sauer.    Allergies  Allergen Reactions  . Amoxicillin-Pot Clavulanate Nausea Only    Has patient had a PCN reaction causing immediate rash, facial/tongue/throat swelling, SOB or lightheadedness with hypotension: No Has patient had a PCN reaction causing severe rash involving mucus membranes or skin necrosis: No Has patient had a PCN reaction that required hospitalization No Has patient had a PCN reaction occurring within the last 10 years: No If all of the above answers are "NO", then may  proceed with Cephalosporin use.   . Clindamycin/Lincomycin Other (See Comments)    Felt like it "burned out" her stomach/took a large dose  . Sertraline Hcl   . Paroxetine Hcl Rash  . Sulfa Antibiotics Rash    Current Outpatient Medications  Medication Sig Dispense Refill  . albuterol (PROAIR HFA) 108 (90 Base) MCG/ACT inhaler Inhale 1-2 puffs into the lungs every 6 (six) hours as needed for shortness of breath.     . bisacodyl (DULCOLAX) 5 MG EC tablet Take 10 mg by mouth daily as needed for moderate constipation.    . Calcium Carbonate-Vitamin D (CALCIUM 500+D HIGH POTENCY PO) Take 1 tablet by mouth daily.     . Carboxymethylcellul-Glycerin (CLEAR EYES FOR DRY EYES) 1-0.25 % SOLN Place 1-2 drops into both eyes  daily as needed.    . Cholecalciferol (VITAMIN D3) 5000 units CAPS Take 5,000 Units by mouth daily.    . Cyanocobalamin (VITAMIN B-12) 1000 MCG SUBL Place 1,000 mcg under the tongue daily.     . Glycopyrrolate-Formoterol (BEVESPI AEROSPHERE) 9-4.8 MCG/ACT AERO Inhale 2 puffs into the lungs 2 (two) times daily. 1 Inhaler 11  . ipratropium-albuterol (DUONEB) 0.5-2.5 (3) MG/3ML SOLN Take 3 mLs by nebulization every 4 (four) hours as needed.    . naproxen sodium (ALEVE) 220 MG tablet Take 440 mg by mouth 2 (two) times daily with a meal.    . oxyCODONE (OXY IR/ROXICODONE) 5 MG immediate release tablet TAKE 1 TABLET(S) 5 TIMES A DAY BY ORAL ROUTE AS NEEDED.  0  . QUEtiapine (SEROQUEL) 25 MG tablet Take 25 mg by mouth at bedtime.     No current facility-administered medications for this visit.     ROS: See HPI for pertinent positives and negatives.   Physical Examination  Vitals:   07/10/17 1253  BP: 112/69  Pulse: 83  Resp: 16  Temp: 98 F (36.7 C)  TempSrc: Oral  SpO2: 93%  Weight: 91 lb (41.3 kg)  Height: 5\' 3"  (1.6 m)   Body mass index is 16.12 kg/m.  General: A&O x 3, WDWN, thin female. Gait: slow, steady Eyes: PERRLA. Pulmonary: limited air movement in all  fields, faint wheezes in all fields, +moist cough, no rales or rhonchi.  Cardiac: regular rhythm with occasional premature contractions, no detected murmur.    Carotid Bruits Right Left   Negative Negative   Abdominal aortic pulse is palpable. Radial pulses: 2+ palpable   VASCULAR EXAM: Extremitieswithoutischemic changes, withoutGangrene; withoutopen wounds. All toes are pink with brisk capillary refill.   LE Pulses Right Left  FEMORAL 3+ palpable 3+ palpable   POPLITEAL not palpable  not palpable  POSTERIOR TIBIAL not palpable  not palpable   DORSALIS PEDIS ANTERIOR TIBIAL not palpable  not palpable    Abdomen: soft, non tender, no palpable masses, well healed surgical scar. Skin: no rashes, no ulcers. Musculoskeletal: Mild genralized muscle wasting and atrophy, deconditioning.  Neurologic: A&O X 3; Appropriate Affect ; SENSATION: normal; MOTOR FUNCTION: moving all extremities equally, motor strength 4/5 throughout. Speech is fluent/normal. CN 2-12 intact.  Psychiatric: Thought content is normal, Loquacious, anxious.      ASSESSMENT: Tina Bailey is a 72 y.o. female who has a recent history of spontaneously resolved blue toe syndrome of the left 5th toe. CT angiogram in September 2016 shows 50% stenosis of the left superficial femoral artery. Otherwise her arterial tree is without any significant pathology.  Liver and pancreas masses incidentally found on this CTA resulted in resection of a malignant liver mass in December of 2016; she is trying to regain some stamina after this.  Her walking is limited by dyspnea, and recently by worsening chronic pain from her back; her pain has come under control, she obtained a walker, and is able to walk more. Significant decline in right ABI, but sx's are the same in both legs, pt states right leg does not feel any worse with walking than at  her last visit. There are no signs of ischemia in her feet or legs.   Her atherosclerotic risk factors include active smoking (50+ years), liver cancer, and COPD.   DATA  ABI (Date: 07/10/2017):  R:   ABI: 0.66 (was 0.90 on 05-27-16),   PT: mono (was tri)  DP: mono (was bi)  TBI:  0.28 (was 0.45)  L:   ABI: 0.78 (was 0.77),   PT: waveform morphology not documented (was bi)  DP: waveform morphology not documented (was bi)   TBI: 0.62 (was 0.60) Decline in right ABI and TBI with moderate disease, stable left ABI and TBI with moderate disease.     CT angiogram of the abdomen and pelvis with runoff:September 2016: There is a 50% stenosis of the left superficial femoral artery. Otherwise her arterial tree is without any significant pathology. However, a mass was detected in the central aspect of her liver. This was approximate 5-6 cm in diameter. A follow-up MRI of the abdomen was performed which suggests that this mass may be malignant. There were also several masses noted in the tail of the pancreas.    PLAN:  The patient was counseled re smoking cessation and given several free resources re smoking cessation.  Graduated walking program with her rolling walker with seat, discussed and how to achieve.   Based on the patient's vascular studies and examination, pt will return to clinic in 6 monthswith ABI's and right LE arterial duplex. .   I advised her to notify us if she develops concerns re the circulation in her feet or legs.   I discussed in depth with the patient the nature of atherosclerosis, and emphasized the importance of maximal medical management including strict control of blood pressure, blood glucose, and lipid levels, obtaining regular exercise, and cessation of smoking.  The patient is aware that without maximal medical management the underlying atherosclerotic disease process will progress, limiting the benefit of any interventions.  The patient was  given information about PAD including signs, symptoms, treatment, what symptoms should prompt the patient to seek immediate medical care, and risk reduction measures to take.  Clemon Chambers, RN, MSN, FNP-C [facility] Phone: [phone]  Clinic MD: Early on call  07/10/17 12:58 PM

## 2017-07-10 NOTE — Patient Instructions (Signed)
Steps to Quit Smoking Smoking tobacco can be bad for your health. It can also affect almost every organ in your body. Smoking puts you and people around you at risk for many serious long-lasting (chronic) diseases. Quitting smoking is hard, but it is one of the best things that you can do for your health. It is never too late to quit. What are the benefits of quitting smoking? When you quit smoking, you lower your risk for getting serious diseases and conditions. They can include:  Lung cancer or lung disease.  Heart disease.  Stroke.  Heart attack.  Not being able to have children (infertility).  Weak bones (osteoporosis) and broken bones (fractures).  If you have coughing, wheezing, and shortness of breath, those symptoms may get better when you quit. You may also get sick less often. If you are pregnant, quitting smoking can help to lower your chances of having a baby of low birth weight. What can I do to help me quit smoking? Talk with your doctor about what can help you quit smoking. Some things you can do (strategies) include:  Quitting smoking totally, instead of slowly cutting back how much you smoke over a period of time.  Going to in-person counseling. You are more likely to quit if you go to many counseling sessions.  Using resources and support systems, such as: ? Online chats with a counselor. ? Phone quitlines. ? Printed self-help materials. ? Support groups or group counseling. ? Text messaging programs. ? Mobile phone apps or applications.  Taking medicines. Some of these medicines may have nicotine in them. If you are pregnant or breastfeeding, do not take any medicines to quit smoking unless your doctor says it is okay. Talk with your doctor about counseling or other things that can help you.  Talk with your doctor about using more than one strategy at the same time, such as taking medicines while you are also going to in-person counseling. This can help make  quitting easier. What things can I do to make it easier to quit? Quitting smoking might feel very hard at first, but there is a lot that you can do to make it easier. Take these steps:  Talk to your family and friends. Ask them to support and encourage you.  Call phone quitlines, reach out to support groups, or work with a counselor.  Ask people who smoke to not smoke around you.  Avoid places that make you want (trigger) to smoke, such as: ? Bars. ? Parties. ? Smoke-break areas at work.  Spend time with people who do not smoke.  Lower the stress in your life. Stress can make you want to smoke. Try these things to help your stress: ? Getting regular exercise. ? Deep-breathing exercises. ? Yoga. ? Meditating. ? Doing a body scan. To do this, close your eyes, focus on one area of your body at a time from head to toe, and notice which parts of your body are tense. Try to relax the muscles in those areas.  Download or buy apps on your mobile phone or tablet that can help you stick to your quit plan. There are many free apps, such as QuitGuide from the CDC (Centers for Disease Control and Prevention). You can find more support from smokefree.gov and other websites.  This information is not intended to replace advice given to you by your health care provider. Make sure you discuss any questions you have with your health care provider. Document Released: 01/01/2009 Document   Revised: 11/03/2015 Document Reviewed: 07/22/2014 Elsevier Interactive Patient Education  2018 Elsevier Inc.     Peripheral Vascular Disease Peripheral vascular disease (PVD) is a disease of the blood vessels that are not part of your heart and brain. A simple term for PVD is poor circulation. In most cases, PVD narrows the blood vessels that carry blood from your heart to the rest of your body. This can result in a decreased supply of blood to your arms, legs, and internal organs, like your stomach or kidneys.  However, it most often affects a person's lower legs and feet. There are two types of PVD.  Organic PVD. This is the more common type. It is caused by damage to the structure of blood vessels.  Functional PVD. This is caused by conditions that make blood vessels contract and tighten (spasm).  Without treatment, PVD tends to get worse over time. PVD can also lead to acute ischemic limb. This is when an arm or limb suddenly has trouble getting enough blood. This is a medical emergency. Follow these instructions at home:  Take medicines only as told by your doctor.  Do not use any tobacco products, including cigarettes, chewing tobacco, or electronic cigarettes. If you need help quitting, ask your doctor.  Lose weight if you are overweight, and maintain a healthy weight as told by your doctor.  Eat a diet that is low in fat and cholesterol. If you need help, ask your doctor.  Exercise regularly. Ask your doctor for some good activities for you.  Take good care of your feet. ? Wear comfortable shoes that fit well. ? Check your feet often for any cuts or sores. Contact a doctor if:  You have cramps in your legs while walking.  You have leg pain when you are at rest.  You have coldness in a leg or foot.  Your skin changes.  You are unable to get or have an erection (erectile dysfunction).  You have cuts or sores on your feet that are not healing. Get help right away if:  Your arm or leg turns cold and blue.  Your arms or legs become red, warm, swollen, painful, or numb.  You have chest pain or trouble breathing.  You suddenly have weakness in your face, arm, or leg.  You become very confused or you cannot speak.  You suddenly have a very bad headache.  You suddenly cannot see. This information is not intended to replace advice given to you by your health care provider. Make sure you discuss any questions you have with your health care provider. Document Released:  06/01/2009 Document Revised: 08/13/2015 Document Reviewed: 08/15/2013 Elsevier Interactive Patient Education  2017 Elsevier Inc.  

## 2017-07-22 DIAGNOSIS — J441 Chronic obstructive pulmonary disease with (acute) exacerbation: Secondary | ICD-10-CM | POA: Diagnosis not present

## 2017-07-31 ENCOUNTER — Other Ambulatory Visit: Payer: Self-pay

## 2017-07-31 DIAGNOSIS — I779 Disorder of arteries and arterioles, unspecified: Secondary | ICD-10-CM

## 2017-08-02 DIAGNOSIS — M546 Pain in thoracic spine: Secondary | ICD-10-CM | POA: Diagnosis not present

## 2017-08-22 DIAGNOSIS — J441 Chronic obstructive pulmonary disease with (acute) exacerbation: Secondary | ICD-10-CM | POA: Diagnosis not present

## 2017-09-20 ENCOUNTER — Encounter: Payer: Self-pay | Admitting: Family Medicine

## 2017-09-20 ENCOUNTER — Other Ambulatory Visit: Payer: Self-pay

## 2017-09-20 ENCOUNTER — Ambulatory Visit (INDEPENDENT_AMBULATORY_CARE_PROVIDER_SITE_OTHER): Payer: Medicare Other | Admitting: Family Medicine

## 2017-09-20 VITALS — BP 118/78 | HR 91 | Temp 98.1°F | Resp 17 | Ht 63.0 in | Wt 92.0 lb

## 2017-09-20 DIAGNOSIS — I75022 Atheroembolism of left lower extremity: Secondary | ICD-10-CM | POA: Diagnosis not present

## 2017-09-20 DIAGNOSIS — C220 Liver cell carcinoma: Secondary | ICD-10-CM

## 2017-09-20 DIAGNOSIS — R5383 Other fatigue: Secondary | ICD-10-CM | POA: Diagnosis not present

## 2017-09-20 DIAGNOSIS — E440 Moderate protein-calorie malnutrition: Secondary | ICD-10-CM | POA: Diagnosis not present

## 2017-09-20 LAB — VITAMIN D 25 HYDROXY (VIT D DEFICIENCY, FRACTURES): VITD: 44.57 ng/mL (ref 30.00–100.00)

## 2017-09-20 LAB — CBC WITH DIFFERENTIAL/PLATELET
Basophils Absolute: 0.1 10*3/uL (ref 0.0–0.1)
Basophils Relative: 0.8 % (ref 0.0–3.0)
Eosinophils Absolute: 0.1 10*3/uL (ref 0.0–0.7)
Eosinophils Relative: 1.7 % (ref 0.0–5.0)
HCT: 45.7 % (ref 36.0–46.0)
Hemoglobin: 15.4 g/dL — ABNORMAL HIGH (ref 12.0–15.0)
Lymphocytes Relative: 35.9 % (ref 12.0–46.0)
Lymphs Abs: 2.1 10*3/uL (ref 0.7–4.0)
MCHC: 33.6 g/dL (ref 30.0–36.0)
MCV: 92.7 fl (ref 78.0–100.0)
Monocytes Absolute: 0.6 10*3/uL (ref 0.1–1.0)
Monocytes Relative: 9.8 % (ref 3.0–12.0)
Neutro Abs: 3.1 10*3/uL (ref 1.4–7.7)
Neutrophils Relative %: 51.8 % (ref 43.0–77.0)
Platelets: 185 10*3/uL (ref 150.0–400.0)
RBC: 4.93 Mil/uL (ref 3.87–5.11)
RDW: 13.5 % (ref 11.5–15.5)
WBC: 5.9 10*3/uL (ref 4.0–10.5)

## 2017-09-20 LAB — HEPATIC FUNCTION PANEL
ALT: 14 U/L (ref 0–35)
AST: 19 U/L (ref 0–37)
Albumin: 4.5 g/dL (ref 3.5–5.2)
Alkaline Phosphatase: 75 U/L (ref 39–117)
Bilirubin, Direct: 0.1 mg/dL (ref 0.0–0.3)
Total Bilirubin: 0.5 mg/dL (ref 0.2–1.2)
Total Protein: 7.1 g/dL (ref 6.0–8.3)

## 2017-09-20 LAB — B12 AND FOLATE PANEL
Folate: >24.1 ng/mL (ref >5.9)
Vitamin B-12: >1500 pg/mL — ABNORMAL HIGH (ref 211–911)

## 2017-09-20 LAB — BASIC METABOLIC PANEL
BUN: 12 mg/dL (ref 6–23)
CO2: 36 mEq/L — ABNORMAL HIGH (ref 19–32)
Calcium: 9.3 mg/dL (ref 8.4–10.5)
Chloride: 95 mEq/L — ABNORMAL LOW (ref 96–112)
Creatinine, Ser: 0.51 mg/dL (ref 0.40–1.20)
GFR: 125.78 mL/min (ref >60.00)
Glucose, Bld: 105 mg/dL — ABNORMAL HIGH (ref 70–99)
Potassium: 4.4 mEq/L (ref 3.5–5.1)
Sodium: 137 mEq/L (ref 135–145)

## 2017-09-20 LAB — TSH: TSH: 1.22 u[IU]/mL (ref 0.35–4.50)

## 2017-09-20 NOTE — Patient Instructions (Addendum)
Follow up as needed or as scheduled We'll notify you of your lab results and make any changes if needed Please discuss core strengthening w/ Dr Nelva Bush Someone will call you with your appt w/ Dr Burr Medico Continue to eat regularly and drink Ensure at least once daily Call with any questions or concerns Hang in there!!!

## 2017-09-20 NOTE — Progress Notes (Signed)
   Subjective:    Patient ID: Tina Bailey, female    DOB: 11/19/1945, 72 y.o.   MRN: [id number]  HPI Fatigue- 'I thought it was my medications but i've gone off of Seroquel and the fatigue thing is still there'.  sxs started ~3 months ago.  Pt reports the fatigue will 'just come over me'.  Occurs 'several times a day'.  Pt reports she will lie down and symptoms will resolve.  She finds that it typically happens 'an hour before I take my oxycodone'.  Is also fatigued w/ exertion.  Pt finds that her fatigue will at times be accompanied by SOB.  'I don't eat much'.  Decreased appetite.  Drinking Ensure.  Pt is eating breakfast/lunch/dinner- 'I just don't eat large portions'.  Pt has hx of using O2 for COPD- this was dc'd at pt's request.  'I don't want it.  I don't want to be tethered to it'.  Reports home O2 is 90-91%- increases when she lies down.   Review of Systems For ROS see HPI     Objective:   Physical Exam  Constitutional: She is oriented to person, place, and time. She appears well-developed. No distress.  Cachectic appearing  HENT:  Head: Normocephalic and atraumatic.  Eyes: Pupils are equal, round, and reactive to light. Conjunctivae and EOM are normal.  Neck: Normal range of motion. Neck supple. No thyromegaly present.  Cardiovascular: Normal rate, regular rhythm, normal heart sounds and intact distal pulses.  No murmur heard. Pulmonary/Chest: Effort normal and breath sounds normal. No respiratory distress.  Abdominal: Soft. She exhibits no distension. There is no tenderness.  Musculoskeletal: She exhibits no edema.  Lymphadenopathy:    She has no cervical adenopathy.  Neurological: She is alert and oriented to person, place, and time.  Skin: Skin is warm and dry.  Psychiatric: Her behavior is normal.  Labile affect- smiling/laughing and then quickly tearful  Vitals reviewed.         Assessment & Plan:  Fatigue- recurrent problem for pt.  Suspect that this is  multi-factorial- COPD (she stopped her O2), poor nutrition, medication (narcotics), mood disorder.  Check labs to assess for metabolic causes.  Discussed restarting O2- pt declines.  Will follow and determine next steps based on results.  Pt expressed understanding and is in agreement w/ plan.

## 2017-09-20 NOTE — Assessment & Plan Note (Signed)
Following w/ Vascular

## 2017-09-20 NOTE — Assessment & Plan Note (Addendum)
Following w/ Dr Burr Medico but has not been seen recently.  Check AFP and refer back for f/u.

## 2017-09-21 DIAGNOSIS — J441 Chronic obstructive pulmonary disease with (acute) exacerbation: Secondary | ICD-10-CM | POA: Diagnosis not present

## 2017-09-21 LAB — AFP TUMOR MARKER: AFP-Tumor Marker: 2.8 ng/mL

## 2017-09-22 ENCOUNTER — Telehealth: Payer: Self-pay | Admitting: Hematology

## 2017-09-22 NOTE — Telephone Encounter (Signed)
Appointment scheduled/ patient declined lab appt due to having them drawn last week by PCP.  IB message to Dr Burr Medico regarding this also per 7/3 sch msg

## 2017-09-28 DIAGNOSIS — M353 Polymyalgia rheumatica: Secondary | ICD-10-CM | POA: Diagnosis not present

## 2017-09-28 DIAGNOSIS — M810 Age-related osteoporosis without current pathological fracture: Secondary | ICD-10-CM | POA: Diagnosis not present

## 2017-09-28 DIAGNOSIS — M797 Fibromyalgia: Secondary | ICD-10-CM | POA: Diagnosis not present

## 2017-10-03 NOTE — Assessment & Plan Note (Signed)
Ongoing issue for pt.  Stressed that if she is not eating properly, she will not have the energy to get through her day.  Again discussed need for protein and use of Ensure.  Will follow.

## 2017-10-05 NOTE — Progress Notes (Signed)
[place]  [phone] [phone]) [phone]  Clinic follow Up Note   Patient Care Team: Midge Minium, MD as PCP - General (Family Medicine) Oneida Alar Jessy Oto, MD as Consulting Physician (Vascular Surgery) Stark Klein, MD as Consulting Physician (General Surgery) Truitt Merle, MD as Consulting Physician (Hematology) Suella Broad, MD as Consulting Physician (Physical Medicine and Rehabilitation) Valinda Party, MD as Consulting Physician (Rheumatology) 10/10/2017   CHIEF COMPLAINTS:  Follow-up on Hepatocellular carcinoma  Oncology History   Hepatocellular carcinoma [facility])   Staging form: Liver (Excluding Intrahepatic Bile Ducts), AJCC 7th Edition     Clinical stage from 12/30/2014: Stage Unknown (T1, NX, M0) - Unsigned       Hepatocellular carcinoma ([place])   12/02/2014 Imaging    Liver MRI showed a large lesion in segment 5 and 40, highly suspicious for malignancy. And a 1.2 cm indeterminate pancreatic body lesion. Mildly enlarged portocaval lymph node. Cholelithiasis.      12/19/2014 Initial Diagnosis    Hepatocellular carcinoma ([place])      12/19/2014 Initial Biopsy    Liver mass biopsy showed parathyroid carcinoma, positive for hep par 1, cytokeratin 8/18, cytokeratin 7 and MOC-31.      12/25/2014 Imaging    CT chest showed a 3 mm groundglass probably nodule in the right upper lobe, mild right pericardial phrenic lymphadenopathy, no definitive evidence of metastasis.      01/13/2015 Procedure    Liquid embolization of the right portal venous system by Interventional Radiologist Dr. Laurence Ferrari      03/04/2015 Surgery    Liver right lobe hepatectomy, and the portal lymph nodes biopsy      03/04/2015 Pathology Results    Hepatocellular carcinoma, poorly differentiated, spanning 8.5 cm, surgical margins were negative, lymphovascular invasion is identified, for portal lymph nodes were negative.       02/29/2016 Imaging    CT chest w  contrast 02/29/2016 IMPRESSION: 1. Status post partial right hepatectomy, without recurrent or metastatic disease. 2. Removal of common duct stent since 06/10/2015. Upper normal to mildly dilated common duct after cholecystectomy. Recommend attention on follow-up. 3. Similar pancreatic head lesion, as detailed on prior MRI. 4. Pelvic floor laxity with cystocele. 5.  Aortic atherosclerosis.      10/14/2016 Imaging    MRI Abdomen W WO Contrast 10/14/16 IMPRESSION: 1. Stable appearance of the liver status post right hepatectomy. No evidence of recurrent tumor or metastatic disease. 2. Suggested slight interval enlargement of known cystic pancreatic mass, likely communicating with the main pancreatic duct and probably reflecting an intraductal papillary mucinous neoplasm. This demonstrates no aggressive characteristics, and continued surveillance suggested.       HISTORY OF PRESENTING ILLNESS:  Tina Bailey 72 y.o. female is here because of recently discovered liver mass.  She has had abdominal pain after meals for the past 4 years, occurs once every several months, usually resolves after 20-30 mins on its own. She was seen by her PCP, had [place] and her symptoms was felt to be related to her gall bladder stone.   She has had some pressure feeling under right ribcage, which radiates to back, for the past 8-9 months, and slight decrease her appetite, and fatigue, she lost about 10 lbs.   She went to see Dr. Oneida Alar for left 5th toes color change (blue), no claudication, she underwent CT angiogram which incidentally showed a large liver mass and a 1.2 cm pancreatic cystic lesion. She was referred to [place] for further management.  She never had colonoscopy. She denies melena, and she is here, change of her bowel habit. Se lives with her daughter. She has extensive smoking history, no family history of malignancy.  CURRENT THERAPY: Surveillance   INTERIM HISTORY Tina Bailey returns for  follow-up. She is here today for follow-up. She is here alone. She uses a walker. She is doing well. She said that she had multiple fractures in her vertebrae due to osteoporosis. She had been to PT twice and had 2 fractures. She would like to be able to build more muscle and stand up on her own. She is concerned about having to stay still during imaging due to anxiety and depression. She has COPD. She finally said that she is willing to do for an MRI.     MEDICAL HISTORY:  Past Medical History:  Diagnosis Date  . Arthritis    DDD. Right shoulder"is frozen"-limited ROM. osteoporosis.  . Blue toe syndrome ([place]) 11/27/2014   Dr. Claudia Pollock evaluating  . Cancer ([place]) 12/2014   liver cancer  . Complication of anesthesia   . COPD (chronic obstructive pulmonary disease) ([place])   . Fibromyalgia   . Fracture of rib of right side    hx "osteoporosis"- states her dog nudge her on the side, next day developed great pain and was told has a fracture rib.  . Macular degeneration    R eye  . Osteoporosis   . PONV (postoperative nausea and vomiting)    nausea,severe vomiting after 01-13-15 portal vein embolization  . Productive cough   . Retina disorder    L eye, vision distorted, edema  . Spine fracture due to birth trauma   . TMJ disease   . Wears glasses     SURGICAL HISTORY: Past Surgical History:  Procedure Laterality Date  . ANGIOPLASTY  2008   no stents required, no follow-up with cardiologist, no recurrent chest pain  . CATARACT EXTRACTION, BILATERAL Bilateral   . ESOPHAGOGASTRODUODENOSCOPY (EGD) WITH PROPOFOL N/A 06/25/2015   Procedure: ESOPHAGOGASTRODUODENOSCOPY (EGD) WITH PROPOFOL;  Surgeon: Milus Banister, MD;  Location: [facility];  Service: Endoscopy;  Laterality: N/A;  stent removal   . EYE SURGERY     cornea surgery  . IR RADIOLOGIST EVAL & MGMT  03/29/2017  . KIDNEY STONE SURGERY    . LAPAROSCOPIC PARTIAL HEPATECTOMY N/A 03/04/2015   Procedure: DIAGNOSTIC LAPAROSCOPY,  EXTENDED RIGHT HEPATECTOMY, WITH INTRAOPERATIVE ULTRASOUND;  Surgeon: Stark Klein, MD;  Location: [facility];  Service: General;  Laterality: N/A;  . PARTIAL HYSTERECTOMY    . portal vein embolization     01-13-15 -Dr. Cathlean Sauer.    SOCIAL HISTORY: Social History   Social History  . Marital Status: Divorced    Spouse Name: N/A  . Number of Children: 3   . Years of Education: N/A   Occupational History  . 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 Smoking status: Current Every Day Smoker -- 1.00 packs/day for 45 years    Types: Cigarettes  . Smokeless tobacco: Never Used  . Alcohol Use: No  . Drug Use: No  . Sexual Activity: Not on file   Other Topics Concern  . Not on file   Social History Narrative    FAMILY HISTORY: Family History  Problem Relation Age of Onset  . Hypertension Mother   . Atrial fibrillation Mother   . Heart disease Mother        after age 8  .  Stroke Father   . Dementia Father   . Heart disease Brother        After age 41- A-Fib  . Heart attack Brother     ALLERGIES:  is allergic to sertraline hcl; amoxicillin-pot clavulanate; clindamycin/lincomycin; paroxetine hcl; and sulfa antibiotics.  MEDICATIONS:  Current Outpatient Medications  Medication Sig Dispense Refill  . albuterol (PROAIR HFA) 108 (90 Base) MCG/ACT inhaler Inhale 1-2 puffs into the lungs every 6 (six) hours as needed for shortness of breath.     . bisacodyl (DULCOLAX) 5 MG EC tablet Take 10 mg by mouth daily as needed for moderate constipation.    . Calcium Carbonate-Vitamin D (CALCIUM 500+D HIGH POTENCY PO) Take 1 tablet by mouth daily.     . Carboxymethylcellul-Glycerin (CLEAR EYES FOR DRY EYES) 1-0.25 % SOLN Place 1-2 drops into both eyes daily as needed.    . Cholecalciferol (VITAMIN D3) 5000 units CAPS Take 5,000 Units by mouth daily.    . Cyanocobalamin (VITAMIN B-12) 1000 MCG SUBL Place 1,000 mcg under the tongue daily.     .  Glycopyrrolate-Formoterol (BEVESPI AEROSPHERE) 9-4.8 MCG/ACT AERO Inhale 2 puffs into the lungs 2 (two) times daily. 1 Inhaler 11  . ibuprofen (ADVIL,MOTRIN) 200 MG tablet Take by mouth.    Marland Kitchen ipratropium-albuterol (DUONEB) 0.5-2.5 (3) MG/3ML SOLN Take 3 mLs by nebulization every 4 (four) hours as needed.    . Multiple Vitamins-Minerals (CENTRUM SILVER ULTRA WOMENS PO) Take by mouth.    . naproxen sodium (ALEVE) 220 MG tablet Take 440 mg by mouth 2 (two) times daily with a meal.    . Nutritional Supplements (ENSURE ORIGINAL PO) Take by mouth.    . oxyCODONE (OXY IR/ROXICODONE) 5 MG immediate release tablet TAKE 1 TABLET(S) 5 TIMES A DAY BY ORAL ROUTE AS NEEDED.  0  . QUEtiapine (SEROQUEL) 25 MG tablet Take 12.5 mg by mouth at bedtime.     Marland Kitchen LORazepam (ATIVAN) 1 MG tablet Take 1-2 tablets (1-2 mg total) by mouth once for 1 dose. 2 tablet 0   No current facility-administered medications for this visit.     REVIEW OF SYSTEMS:  Constitutional: Denies fevers, chills or abnormal night sweats, (+) fatigue (+) walker Eyes: Denies blurriness of vision, double vision or watery eyes Ears, nose, mouth, throat, and face: Denies mucositis or sore throat Respiratory: Denies cough  (+) SOB, wheezing, history of COPD Cardiovascular: Denies palpitation, chest discomfort or lower extremity swelling Gastrointestinal:  Denies nausea, heartburn Skin: Denies abnormal skin rashes Lymphatics: Denies new lymphadenopathy or easy bruising Neurological:Denies numbness, tingling or new weaknesses, (+) mid back pain MSK: (+) multiple vertebral fractures   Behavioral/Psych: Mood is stable, no new changes  All other systems were reviewed with the patient and are negative.  PHYSICAL EXAMINATION:  ECOG PERFORMANCE STATUS: 3  Vitals:   10/10/17 1405  BP: (!) 141/72  Pulse: 91  Resp: 17  Temp: 98.9 F (37.2 C)  SpO2: 92%   Filed Weights   10/10/17 1405  Weight: 93 lb 4.8 oz (42.3 kg)     GENERAL:alert, no  distress and comfortable, she wear a thoracic spine brace  SKIN: skin color, texture, turgor are normal, no rashes or significant lesions EYES: normal, conjunctiva are pink and non-injected, sclera clear OROPHARYNX:no exudate, no erythema and lips, buccal mucosa, and tongue normal  NECK: supple, thyroid normal size, non-tender, without nodularity LYMPH:  no palpable lymphadenopathy in the cervical, axillary or inguinal LUNGS: (+) bilateral wheezing, no rales. HEART: regular rate &  rhythm and no murmurs and no lower extremity edema ABDOMEN:abdomen soft, normal bowel sounds, no organomegaly (+) RUQ tenderness, mild hepatomegaly 1-2 cm below costal margin Musculoskeletal:no cyanosis of digits and no clubbing, no tender spot. PSYCH: alert & oriented x 3 with fluent speech NEURO: no focal motor/sensory deficits  LABORATORY DATA:  I have reviewed the data as listed CBC Latest Ref Rng & Units 09/20/2017 04/20/2017 01/09/2017  WBC 4.0 - 10.5 K/uL 5.9 5.7 6.3  Hemoglobin 12.0 - 15.0 g/dL 15.4(H) 15.0 13.6  Hematocrit 36.0 - 46.0 % 45.7 45.4 41.0  Platelets 150.0 - 400.0 K/uL 185.0 222 260.0    CMP Latest Ref Rng & Units 09/20/2017 04/20/2017 01/09/2017  Glucose 70 - 99 mg/dL 105(H) 97 -  BUN 6 - 23 mg/dL 12 11 -  Creatinine 0.40 - 1.20 mg/dL 0.51 0.54 -  Sodium 135 - 145 mEq/L 137 133(L) -  Potassium 3.5 - 5.1 mEq/L 4.4 4.8 -  Chloride 96 - 112 mEq/L 95(L) 96(L) -  CO2 19 - 32 mEq/L 36(H) 28 -  Calcium 8.4 - 10.5 mg/dL 9.3 9.1 -  Total Protein 6.0 - 8.3 g/dL 7.1 - 6.9  Total Bilirubin 0.2 - 1.2 mg/dL 0.5 - 0.6  Alkaline Phos 39 - 117 U/L 75 - 112  AST 0 - 37 U/L 19 - 22  ALT 0 - 35 U/L 14 - 19     AFP Results for SHANEKIA, LATELLA (MRN [id number]) as of 10/05/2017 16:01  Ref. Range 12/11/2014 12:34 06/09/2015 12:21 09/07/2015 11:30 12/07/2015 12:23 09/20/2017 13:49  AFP Tumor Marker Latest Units: ng/mL 15.7 (H) 3.6 2.5 3.4 2.8     PATHOLOGY REPORT  Diagnosis 03/04/2015 1. Liver, biopsy,  left sided nodule - BENIGN HEPATIC PARENCHYMA. - THERE IS NO EVIDENCE OF MALIGNANCY. 2. Liver, biopsy, posterior sided nodule - BENIGN HEPATIC PARENCHYMA WITH ASSOCIATED FIBROUS TISSUE. - THERE IS NO EVIDENCE OF MALIGNANCY. 3. Lymph node, biopsy, portal - THERE IS NO EVIDENCE OF CARCINOMA IN 3 OF 3 LYMPH NODES (0/3). 4. Lymph node, biopsy, posterior portal - THERE IS NO EVIDENCE OF CARCINOMA IN 1 OF 1 LYMPH NODE (0/1). 5. Liver, hepatectomy, right lobe - HEPATOCELLULAR CARCINOMA, POORLY DIFFERENTIATED, SPANNING 8.5 CM. - THE SURGICAL RESECTION MARGINS ARE NEGATIVE FOR CARCINOMA. - LYMPHOVASCULAR INVASION IS IDENTIFIED. - SEE ONCOLOGY TABLE BELOW. OTHER FINDINGS: - BENIGN HEPATIC PARENCHYMA WITH MILD PORTAL CHRONIC INFLAMMATION AND DILATED VASCULATURE. - GALLBLADDER WITH MILD CHRONIC CHOLECYSTITIS AND CHOLELITHIASIS.  RADIOGRAPHIC STUDIES: I have personally reviewed the radiological images as listed and agreed with the findings in the report.  MRI Abdomen W WO Contrast 10/14/16 IMPRESSION: 1. Stable appearance of the liver status post right hepatectomy. No evidence of recurrent tumor or metastatic disease. 2. Suggested slight interval enlargement of known cystic pancreatic mass, likely communicating with the main pancreatic duct and probably reflecting an intraductal papillary mucinous neoplasm. This demonstrates no aggressive characteristics, and continued surveillance suggested.   CT chest w contrast 02/29/2016 IMPRESSION: 1. Status post partial right hepatectomy, without recurrent or metastatic disease. 2. Removal of common duct stent since 06/10/2015. Upper normal to mildly dilated common duct after cholecystectomy. Recommend attention on follow-up. 3. Similar pancreatic head lesion, as detailed on prior MRI. 4. Pelvic floor laxity with cystocele. 5.  Aortic atherosclerosis.  MRI abdomen w wo contrast 10/27/2015 IMPRESSION: Status post right hepatectomy. No findings  suspicious for recurrent HCC. Stable 1.3 cm cystic lesion in the pancreatic body, possibly reflecting a pseudocyst or IPMN. Mild-to-moderate T7 and T11 compression  fracture deformities, likely progressed.  Mr Liver W Wo Contrast 12/02/2014   IMPRESSION: 1. The first lesion of concern is a fairly vascular mass spanning segments 5 and 4B of the liver with capsule or pseudocapsule appearance, and central enhancement which spreads peripherally on the later images, and also with associated hepatic capsular retraction. Differential diagnostic considerations for this lesion include peripheral cholangiocarcinoma ; hepatic epitheliod hemangioendothelioma ; hepatocellular carcinoma ; or treated metastatic disease. Biopsy is likely warranted. 2. The second lesion of concern is a 1.2 cm pancreatic body lesion with high T2 and low T1 signal characteristics, questionable attachment to the dorsal pancreatic duct, and some internal septation and probably some faint nodular enhancement internally. The appearance on CT, for example image 62 series 9, is somewhat more concerning for internal enhancement, although measurable enhancement is present on MRI. This could be a small macro cystic tumor with solid component; the primary ductal pancreatic adenocarcinoma with cystic degeneration; were cystic degeneration of an islet cell tumor such as insulinoma or glucagon adenoma. Metastatic disease to the pancreas could possibly appear this way. Intraductal papillary mucinous neoplasm with small solid component could also appear this way. Biopsy, surveillance, or further imaging characterization with nuclear medicine PET-CT may be warranted. 3. Cholelithiasis. 4. Mild biliary dilatation. 5. Mildly enlarged portacaval lymph node. In the setting of the hepatic and pancreatic lesions the appearance raises concern for the possibility of early local metastatic disease. This could also be further characterized at PET-CT.   Electronically  Signed   By: Van Clines M.D.   On: 12/02/2014 08:39     ASSESSMENT & PLAN: 72 y.o. African-American female, with 45-pack-year smoking history, presented with incidental scan finding of a large peripheral liver lesion in the right lobe segmental 5 and a 4B, and a small pancreatic cystic lesion.  1. Hepatocellular carcinoma, pT3bN0M0, stage IIIB, poorly differentiated  -I reviewed her surgical pathology findings with her in details. Her surgical margins were negative, she had a complete resection -No role for adjuvant therapy.  -We discussed the risk of cancer recurrence. Giving the locally advanced stage, she is certainly at high risk for recurrence, especially local recurrence. -I previously reviewed her surveillance CT scan from 02/29/16, which showed no evidence of recurrence - continue cancer surveillance, she will be seen every 4-6 months with lab including AFP, and repeated surveillance CT scan every 6-12 months, for total 5 years. -I reviewed her restaging abdominal MRI from 10/14/2016, which showed no evidence of recurrence. -She is clinically doing well, physical exam was unremarkable, except wheezing, lab reviewed, her recent liver function and AFP are within normal limits. No clinical concern for recurrence. -she is due for surveillance scan, I will obtain abdominal MRI with and without contrast in the next few weeks before her visit with Dr. Barry Dienes  -f/u in 6 months   2. Pancreatic lesion -Abdominal MRI showed a 1.2 cm pancreatic body lesion, likely cystic with some solid component.  -Her imaging was reviewed in our tumor before, radiologist feels they pancreatic lesion is less likely malignancy. -abdomen MRI in 10/2015 showed stable pancreatic cyst, likely benign -We'll follow up clinically -Mri from 10/14/16 shows stable pancreatic cysts  3. COPD and smoking cessation  -She will continue to follow-up with her primary care physician -I discussed the smoking cessation  extensively. She is willing to cut back, not ready for quit completely yet.  - I suggest she follows up with her PCP about her COPD managment.   4. T11 compression fracture -  she will continue follow up with her orthopedic surgeon -her mid back pain has improved overall.  5. Osteoporosis -The patient's last DEXA scan was performed by her [facility] -She was prescribed FORTEO in September 2017, but stopped in March 2018 because she did not want to do injections anymore. -She is taking a multivitamin and Vitamin D. -She is scheduled to have a repeated DEXA scan next month  Plan -Continue surveillance, will obtain a abdominal MRI with and without contrast in the next 1 to 2 weeks, before her visit with Dr. Barry Dienes -lab and f/u in 6 months   All questions were answered. The patient knows to call the clinic with any problems, questions or concerns. I spent 20 minutes counseling the patient face to face. The total time spent in the appointment was 25 minutes and more than 50% was on counseling.  Dierdre Searles Dweik am acting as scribe for Dr. Truitt Merle.  I have reviewed the above documentation for accuracy and completeness, and I agree with the above.     Truitt Merle, MD 10/10/2017

## 2017-10-10 ENCOUNTER — Encounter: Payer: Self-pay | Admitting: Hematology

## 2017-10-10 ENCOUNTER — Other Ambulatory Visit: Payer: Medicare Other

## 2017-10-10 ENCOUNTER — Inpatient Hospital Stay: Payer: Medicare Other | Attending: Hematology | Admitting: Hematology

## 2017-10-10 ENCOUNTER — Telehealth: Payer: Self-pay | Admitting: Hematology

## 2017-10-10 VITALS — BP 141/72 | HR 91 | Temp 98.9°F | Resp 17 | Ht 63.0 in | Wt 93.3 lb

## 2017-10-10 DIAGNOSIS — K862 Cyst of pancreas: Secondary | ICD-10-CM | POA: Insufficient documentation

## 2017-10-10 DIAGNOSIS — C220 Liver cell carcinoma: Secondary | ICD-10-CM | POA: Diagnosis present

## 2017-10-10 DIAGNOSIS — M810 Age-related osteoporosis without current pathological fracture: Secondary | ICD-10-CM | POA: Diagnosis not present

## 2017-10-10 DIAGNOSIS — Z79899 Other long term (current) drug therapy: Secondary | ICD-10-CM

## 2017-10-10 DIAGNOSIS — M4854XA Collapsed vertebra, not elsewhere classified, thoracic region, initial encounter for fracture: Secondary | ICD-10-CM | POA: Insufficient documentation

## 2017-10-10 DIAGNOSIS — J449 Chronic obstructive pulmonary disease, unspecified: Secondary | ICD-10-CM | POA: Insufficient documentation

## 2017-10-10 DIAGNOSIS — F17210 Nicotine dependence, cigarettes, uncomplicated: Secondary | ICD-10-CM | POA: Insufficient documentation

## 2017-10-10 MED ORDER — LORAZEPAM 1 MG PO TABS: 1.0000 mg | ORAL_TABLET | Freq: Once | ORAL | 0 refills | Status: AC

## 2017-10-10 NOTE — Telephone Encounter (Signed)
Scheduled appt per 7/23 los - central radiology to contact patient with MRI - pt aware of appts did not want print out.

## 2017-10-17 DIAGNOSIS — M549 Dorsalgia, unspecified: Secondary | ICD-10-CM | POA: Diagnosis not present

## 2017-10-17 NOTE — Progress Notes (Signed)
Subjective:   Tina Bailey is a 72 y.o. female who presents for Medicare Annual (Subsequent) preventive examination.  Review of Systems:  No ROS.  Medicare Wellness Visit. Additional risk factors are reflected in the social history.  Cardiac Risk Factors include: advanced age (>39men, >33 women);smoking/ tobacco exposure;family history of premature cardiovascular disease   Sleep patterns: Sleeps 5-6 hours.  Home Safety/Smoke Alarms: Feels safe in home. Smoke alarms in place.  Living environment; residence and Firearm Safety: Lives with daughter and son in law in 1 story home.  Seat Belt Safety/Bike Helmet: Wears seat belt.   Female:   Pap-N/A     Mammo-Declines      Dexa scan-10/15/2015, Ordered by Dr. Dossie Der (in office)    CCS-Declines     Objective:     Vitals: BP (!) 142/78 (BP Location: Left Arm, Patient Position: Sitting, Cuff Size: Small)   Pulse 95   Wt 92 lb 2 oz (41.8 kg)   SpO2 94%   BMI 16.32 kg/m   Body mass index is 16.32 kg/m.  Advanced Directives 10/18/2017 07/10/2017 04/20/2017 03/07/2017 12/22/2016 10/19/2016 10/12/2016  Does Patient Have a Medical Advance Directive? Yes Yes No Yes No Yes Yes  Type of Paramedic of [place];Living will Comfort;Living will - Living will;Healthcare Power of Attorney - - Living will;Healthcare Power of Attorney  Does patient want to make changes to medical advance directive? - - - - - - -  Copy of [place] in Chart? No - copy requested Yes - No - copy requested - - No - copy requested  Would patient like information on creating a medical advance directive? - - No - Patient declined - No - Patient declined - -  Pre-existing out of facility DNR order (yellow form or pink MOST form) - - - - - - -    Tobacco Social History   Tobacco Use  Smoking Status Current Every Day Smoker  . Packs/day: 0.50  . Years: 45.00  . Pack years: 22.50  . Types: Cigarettes    Smokeless Tobacco Never Used     Ready to quit: Not Answered Counseling given: Not Answered    Past Medical History:  Diagnosis Date  . Arthritis    DDD. Right shoulder"is frozen"-limited ROM. osteoporosis.  . Blue toe syndrome ([place]) 11/27/2014   Dr. Claudia Pollock evaluating  . Cancer ([place]) 12/2014   liver cancer  . Complication of anesthesia   . COPD (chronic obstructive pulmonary disease) ([place])   . Fibromyalgia   . Fracture of rib of right side    hx "osteoporosis"- states her dog nudge her on the side, next day developed great pain and was told has a fracture rib.  . Macular degeneration    R eye  . Osteoporosis   . PONV (postoperative nausea and vomiting)    nausea,severe vomiting after 01-13-15 portal vein embolization  . Productive cough   . Retina disorder    L eye, vision distorted, edema  . Spine fracture due to birth trauma   . TMJ disease   . Wears glasses    Past Surgical History:  Procedure Laterality Date  . ANGIOPLASTY  2008   no stents required, no follow-up with cardiologist, no recurrent chest pain  . CATARACT EXTRACTION, BILATERAL Bilateral   . ESOPHAGOGASTRODUODENOSCOPY (EGD) WITH PROPOFOL N/A 06/25/2015   Procedure: ESOPHAGOGASTRODUODENOSCOPY (EGD) WITH PROPOFOL;  Surgeon: Milus Banister, MD;  Location: [facility];  Service: Endoscopy;  Laterality: N/A;  stent removal   . EYE SURGERY     cornea surgery  . IR RADIOLOGIST EVAL & MGMT  03/29/2017  . KIDNEY STONE SURGERY    . LAPAROSCOPIC PARTIAL HEPATECTOMY N/A 03/04/2015   Procedure: DIAGNOSTIC LAPAROSCOPY, EXTENDED RIGHT HEPATECTOMY, WITH INTRAOPERATIVE ULTRASOUND;  Surgeon: Stark Klein, MD;  Location: [facility];  Service: General;  Laterality: N/A;  . PARTIAL HYSTERECTOMY    . portal vein embolization     01-13-15 -Dr. Cathlean Sauer.   Family History  Problem Relation Age of Onset  . Hypertension Mother   . Atrial fibrillation Mother   . Heart disease Mother        after age 28  . Stroke  Father   . Dementia Father   . Heart disease Brother        After age 78- A-Fib  . Heart attack Brother    Social History   Socioeconomic History  . Marital status: Divorced    Spouse name: Not on file  . Number of children: Not on file  . Years of education: Not on file  . Highest education level: Not on file  Occupational History  . Not on file  Social Needs  . Financial resource strain: Not on file  . Food insecurity:    Worry: Not on file    Inability: Not on file  . Transportation needs:    Medical: Not on file    Non-medical: Not on file  Tobacco Use  . Smoking status: Current Every Day Smoker    Packs/day: 0.50    Years: 45.00    Pack years: 22.50    Types: Cigarettes  . Smokeless tobacco: Never Used  Substance and Sexual Activity  . Alcohol use: No    Alcohol/week: 0.0 oz  . Drug use: No  . Sexual activity: Not on file  Lifestyle  . Physical activity:    Days per week: Not on file    Minutes per session: Not on file  . Stress: Not on file  Relationships  . Social connections:    Talks on phone: Not on file    Gets together: Not on file    Attends religious service: Not on file    Active member of club or organization: Not on file    Attends meetings of clubs or organizations: Not on file    Relationship status: Not on file  Other Topics Concern  . Not on file  Social History Narrative  . Not on file    Outpatient Encounter Medications as of 10/18/2017  Medication Sig  . bisacodyl (DULCOLAX) 5 MG EC tablet Take 10 mg by mouth daily as needed for moderate constipation.  . Calcium Carbonate-Vitamin D (CALCIUM 500+D HIGH POTENCY PO) Take 1 tablet by mouth daily.   . Carboxymethylcellul-Glycerin (CLEAR EYES FOR DRY EYES) 1-0.25 % SOLN Place 1-2 drops into both eyes daily as needed.  . Cholecalciferol (VITAMIN D3) 5000 units CAPS Take 5,000 Units by mouth daily.  . Cyanocobalamin (VITAMIN B-12) 1000 MCG SUBL Place 1,000 mcg under the tongue daily.   .  Glycopyrrolate-Formoterol (BEVESPI AEROSPHERE) 9-4.8 MCG/ACT AERO Inhale 2 puffs into the lungs 2 (two) times daily.  Marland Kitchen ipratropium-albuterol (DUONEB) 0.5-2.5 (3) MG/3ML SOLN Take 3 mLs by nebulization every 4 (four) hours as needed.  . naproxen sodium (ALEVE) 220 MG tablet Take 440 mg by mouth 2 (two) times daily with a meal.  . Nutritional Supplements (ENSURE ORIGINAL PO) Take by mouth.  Marland Kitchen  oxyCODONE (OXY IR/ROXICODONE) 5 MG immediate release tablet TAKE 1 TABLET(S) 5 TIMES A DAY BY ORAL ROUTE AS NEEDED.  . QUEtiapine (SEROQUEL) 25 MG tablet Take 12.5 mg by mouth at bedtime.   Marland Kitchen albuterol (PROAIR HFA) 108 (90 Base) MCG/ACT inhaler Inhale 1-2 puffs into the lungs every 6 (six) hours as needed for shortness of breath.   Marland Kitchen buPROPion (WELLBUTRIN XL) 150 MG 24 hr tablet   . ibuprofen (ADVIL,MOTRIN) 200 MG tablet Take by mouth.  Marland Kitchen LORazepam (ATIVAN) 1 MG tablet TAKE 1-2 TABLETS (1-2 MG TOTAL) BY MOUTH ONCE FOR 1 DOSE.  . Multiple Vitamins-Minerals (CENTRUM SILVER ULTRA WOMENS PO) Take by mouth.   No facility-administered encounter medications on file as of 10/18/2017.     Activities of Daily Living In your present state of health, do you have any difficulty performing the following activities: 10/18/2017 01/09/2017  Hearing? N N  Vision? N N  Difficulty concentrating or making decisions? Y N  Walking or climbing stairs? Y N  Dressing or bathing? N N  Doing errands, shopping? N N  Preparing Food and eating ? N -  Using the Toilet? N -  In the past six months, have you accidently leaked urine? N -  Do you have problems with loss of bowel control? N -  Managing your Medications? N -  Managing your Finances? N -  Housekeeping or managing your Housekeeping? N -  Some recent data might be hidden    Patient Care Team: Midge Minium, MD as PCP - General (Family Medicine) [place] Alar Jessy Oto, MD as Consulting Physician (Vascular Surgery) Stark Klein, MD as Consulting Physician (General  Surgery) Truitt Merle, MD as Consulting Physician (Hematology) Suella Broad, MD as Consulting Physician (Physical Medicine and Rehabilitation) Valinda Party, MD as Consulting Physician (Rheumatology) Tanda Rockers, MD as Consulting Physician (Pulmonary Disease) Abbie Sons, MD as Referring Physician (Psychiatry)    Assessment:   This is a routine wellness examination for [place].  Exercise Activities and Dietary recommendations Current Exercise Habits: Home exercise routine(stretching and bands), Type of exercise: stretching, Exercise limited by: orthopedic condition(s);respiratory conditions(s)   Diet (meal preparation, eat out, water intake, caffeinated beverages, dairy products, fruits and vegetables): Drinks water, orange juice, coffee and Ensure.   Breakfast: egg; cereal; toast; oatmeal Lunch: pb sandwich; vegetables Dinner: frozen dinner  Goals    . Patient Stated     Increase strength.        Fall Risk Fall Risk  10/18/2017 01/09/2017 10/12/2016 09/19/2016 07/13/2016  Falls in the past year? No No No No No    Depression Screen PHQ 2/9 Scores 10/18/2017 01/09/2017 10/12/2016 10/12/2016  PHQ - 2 Score 2 0 0 0  PHQ- 9 Score 9 0 2 -     Cognitive Function MMSE - Mini Mental State Exam 10/18/2017 10/12/2016  Orientation to time 5 5  Orientation to Place 5 5  Registration 3 3  Attention/ Calculation 5 5  Recall 3 3  Language- name 2 objects 2 2  Language- repeat 1 1  Language- follow 3 step command 3 3  Language- read & follow direction 1 1  Write a sentence 1 1  Copy design 1 1  Total score 30 30        Immunization History  Administered Date(s) Administered  . Tdap 09/13/2013    Screening Tests Health Maintenance  Topic Date Due  . MAMMOGRAM  02/10/1995  . Hepatitis C Screening  10/19/2018 (Originally Jun 14, 1945)  . PNA  vac Low Risk Adult (1 of 2 - PCV13) 10/19/2018 (Originally 02/09/2010)  . INFLUENZA VACCINE  03/02/2020 (Originally 10/19/2017)  .  COLONOSCOPY  09/29/2021  . TETANUS/TDAP  09/14/2023  . DEXA SCAN  Completed        Plan:    Bring a copy of your living will and/or healthcare power of attorney to your next office visit.  Continue doing brain stimulating activities (puzzles, reading, adult coloring books, staying active) to keep memory sharp.   I have personally reviewed and noted the following in the patient's chart:   . Medical and social history . Use of alcohol, tobacco or illicit drugs  . Current medications and supplements . Functional ability and status . Nutritional status . Physical activity . Advanced directives . List of other physicians . Hospitalizations, surgeries, and ER visits in previous 12 months . Vitals . Screenings to include cognitive, depression, and falls . Referrals and appointments  In addition, I have reviewed and discussed with patient certain preventive protocols, quality metrics, and best practice recommendations. A written personalized care plan for preventive services as well as general preventive health recommendations were provided to patient.     Gerilyn Nestle, RN  10/18/2017  PCP Notes: -Pt requesting wheelchair for long distance traveling/walking and recommendation for rehab/PT for increasing strength. Phone note sent.

## 2017-10-18 ENCOUNTER — Other Ambulatory Visit: Payer: Self-pay

## 2017-10-18 ENCOUNTER — Ambulatory Visit (INDEPENDENT_AMBULATORY_CARE_PROVIDER_SITE_OTHER): Payer: Medicare Other

## 2017-10-18 ENCOUNTER — Telehealth: Payer: Self-pay

## 2017-10-18 VITALS — BP 142/78 | HR 95 | Ht 63.0 in | Wt 92.1 lb

## 2017-10-18 DIAGNOSIS — Z0000 Encounter for general adult medical examination without abnormal findings: Secondary | ICD-10-CM | POA: Diagnosis not present

## 2017-10-18 NOTE — Telephone Encounter (Signed)
Patient in for AWV, requesting wheelchair for long distance travel and recommendation for rehab/physical therapy to increase core strength. Declines exercises offered by [facility]. Patient unsure if PCP could order or if she needs to f/u with Dr. Nelva Bush (sees for pain mgt).

## 2017-10-18 NOTE — Patient Instructions (Addendum)

## 2017-10-18 NOTE — Telephone Encounter (Signed)
Per Elyn Aquas, PA-C, advised patient Medicare requires an office visit for mobility evaluation within 60 days of prescription for the mobility device. Patient will contact Dr. Nelva Bush to determine if she can be seen sooner at his office. Will call back to schedule appointment if needed.

## 2017-10-18 NOTE — Progress Notes (Signed)
RN [place] note reviewed in PCP absence. In regards to the wheelchair, Medicare requires an office visit for mobility evaluation within 60 days of prescription for the mobility device. This is something she will need to schedule with PCP.  Leeanne Rio, PA-C

## 2017-10-19 ENCOUNTER — Ambulatory Visit (HOSPITAL_COMMUNITY)
Admission: RE | Admit: 2017-10-19 | Discharge: 2017-10-19 | Disposition: A | Discharge status: Home / Self Care | Payer: Medicare Other | Source: Ambulatory Visit | Attending: Hematology | Admitting: Hematology

## 2017-10-19 DIAGNOSIS — C220 Liver cell carcinoma: Secondary | ICD-10-CM | POA: Insufficient documentation

## 2017-10-19 DIAGNOSIS — C229 Malignant neoplasm of liver, not specified as primary or secondary: Secondary | ICD-10-CM | POA: Diagnosis not present

## 2017-10-19 DIAGNOSIS — K8689 Other specified diseases of pancreas: Secondary | ICD-10-CM | POA: Diagnosis not present

## 2017-10-19 MED ORDER — GADOXETATE DISODIUM 0.25 MMOL/ML IV SOLN
10.0000 mL | Freq: Once | INTRAVENOUS | Status: AC | PRN
  Administered 2017-10-19: 5 mL via INTRAVENOUS

## 2017-10-22 NOTE — Telephone Encounter (Signed)
Noted.  Pt can schedule if she desires

## 2017-10-23 ENCOUNTER — Telehealth: Payer: Self-pay

## 2017-10-23 DIAGNOSIS — Z8505 Personal history of malignant neoplasm of liver: Secondary | ICD-10-CM | POA: Diagnosis not present

## 2017-10-23 NOTE — Telephone Encounter (Signed)
Spoke with patient per Dr. Burr Medico, MRI results came back no concerns, patient has an appointment with Dr. Barry Dienes today.

## 2017-10-23 NOTE — Telephone Encounter (Signed)
-----   Message from Truitt Merle, MD sent at 10/21/2017  1:58 PM EDT ----- Please let pt know the MRI results, no concerns. She is supposed to see Dr. Barry Dienes soon. Thanks  Truitt Merle  10/21/2017

## 2017-11-02 DIAGNOSIS — M810 Age-related osteoporosis without current pathological fracture: Secondary | ICD-10-CM | POA: Diagnosis not present

## 2017-11-07 ENCOUNTER — Ambulatory Visit: Payer: Medicare Other | Attending: General Surgery | Admitting: Physical Therapy

## 2017-11-07 DIAGNOSIS — R293 Abnormal posture: Secondary | ICD-10-CM | POA: Diagnosis not present

## 2017-11-07 DIAGNOSIS — M6281 Muscle weakness (generalized): Secondary | ICD-10-CM | POA: Diagnosis present

## 2017-11-07 NOTE — Therapy (Signed)
[place], [place], [address] Phone: [phone]   Fax:  [phone]  Physical Therapy Evaluation  Patient Details  Name: Tina Bailey MRN: [id number] Date of Birth: 1945-12-14 Referring Provider: Dr. Barry Dienes    Encounter Date: 11/07/2017  PT End of Session - 11/07/17 1500    Visit Number  1    Number of Visits  9    Date for PT Re-Evaluation  12/08/17    PT Start Time  7544    PT Stop Time  1440    PT Time Calculation (min)  55 min    Activity Tolerance  No increased pain    Behavior During Therapy  [facility] for tasks assessed/performed       Past Medical History:  Diagnosis Date  . Arthritis    DDD. Right shoulder"is frozen"-limited ROM. osteoporosis.  . Blue toe syndrome ([place]) 11/27/2014   Dr. Claudia Pollock evaluating  . Cancer ([place]) 12/2014   liver cancer  . Complication of anesthesia   . COPD (chronic obstructive pulmonary disease) ([place])   . Fibromyalgia   . Fracture of rib of right side    hx "osteoporosis"- states her dog nudge her on the side, next day developed great pain and was told has a fracture rib.  . Macular degeneration    R eye  . Osteoporosis   . PONV (postoperative nausea and vomiting)    nausea,severe vomiting after 01-13-15 portal vein embolization  . Productive cough   . Retina disorder    L eye, vision distorted, edema  . Spine fracture due to birth trauma   . TMJ disease   . Wears glasses     Past Surgical History:  Procedure Laterality Date  . ANGIOPLASTY  2008   no stents required, no follow-up with cardiologist, no recurrent chest pain  . CATARACT EXTRACTION, BILATERAL Bilateral   . ESOPHAGOGASTRODUODENOSCOPY (EGD) WITH PROPOFOL N/A 06/25/2015   Procedure: ESOPHAGOGASTRODUODENOSCOPY (EGD) WITH PROPOFOL;  Surgeon: Milus Banister, MD;  Location: [facility];  Service: Endoscopy;  Laterality: N/A;  stent removal   . EYE SURGERY     cornea surgery  . IR  RADIOLOGIST EVAL & MGMT  03/29/2017  . KIDNEY STONE SURGERY    . LAPAROSCOPIC PARTIAL HEPATECTOMY N/A 03/04/2015   Procedure: DIAGNOSTIC LAPAROSCOPY, EXTENDED RIGHT HEPATECTOMY, WITH INTRAOPERATIVE ULTRASOUND;  Surgeon: Stark Klein, MD;  Location: [facility];  Service: General;  Laterality: N/A;  . PARTIAL HYSTERECTOMY    . portal vein embolization     01-13-15 -Dr. Cathlean Sauer.    There were no vitals filed for this visit.   Subjective Assessment - 11/07/17 1353    Subjective  "I'm here for an assessment" Pt states she has lost more weight and isn't as active as she was.  She says she fractures when she is just laying around She says "I can't hold myself up" and indicates around her waist.      Pertinent History  multiple fractured vertebrae from osteoporosis, history of liver cancer ( they took half my liver) in  2016 but she says she is cancer free now .  Past history includes osteoporosis     Limitations  Sitting;Standing;Walking;House hold activities    Patient Stated Goals  "I want to walk"   pt states she wants to work on her body core issues     Currently in Pain?  Yes    Pain Score  8     Pain Location  Back    Pain Orientation  Lower    Pain Onset  More than a month ago    Pain Frequency  Intermittent   when she forces herself up    Aggravating Factors   when she tries to stand up straight     Pain Relieving Factors  pain medicine     Effect of Pain on Daily Activities  she has trouble breathing when she tries to stand up straight          [facility] PT Assessment - 11/07/17 0001      Assessment   Medical Diagnosis  osteoporosis, s/p partial hepatectomy     Referring Provider  Dr. Barry Dienes     Onset Date/Surgical Date  03/04/15    Hand Dominance  Right    Prior Therapy  in 2017 at [facility] and another time at a clinic in [place]       Precautions   Precautions  Fall    Precaution Comments  significant osteoporosis with h/o multiple fractures; cancer  precautions      Restrictions   Weight Bearing Restrictions  No      Balance Screen   Has the patient fallen in the past 6 months  No    Has the patient had a decrease in activity level because of a fear of falling?   No    Is the patient reluctant to leave their home because of a fear of falling?   No      Home Film/video editor residence    Living Arrangements  Children   daughter and son-in-law   Type of [place] to enter    Entrance Stairs-Number of Steps  2    [place]  One level    Additional Comments  ramp in the back for the dogs       Prior Function   Level of [place]  Retired    Leisure  tries to walk, but has difficulty standing up straight       Cognition   Overall Cognitive Status  Within Functional Limits for tasks assessed    Behaviors  Poor frustration tolerance;Other (comment)   Pt  she has been depressed, tearful     Observation/Other Assessments   Observations  Pt thin with obvious kyphosis and upper quadrant muscle atrophy appears to be upset upon arrival.  "is there hope for me?"  She talks rapidly about concerns and appears to be dyspneic at times.  She calmed down later in the session     Skin Integrity  --    Quick DASH   38.64      Functional Tests   Functional tests  Sit to Stand      Sit to Stand   Comments  13 reps of sit to stand in 30 seconds with pt c/o anterior thigh discomfort       Posture/Postural Control   Posture/Postural Control  Postural limitations    Postural Limitations  Rounded Shoulders;Forward head;Decreased lumbar lordosis;Increased thoracic kyphosis   shoulders elevated   Posture Comments  Pt has severe kyphosis with lowered rib cage inthe front.  last rib is posistioned very close  to ASIS       AROM   Overall AROM Comments  very limited right shoulder movment from old injury, limited lumbar flexion, rotation and sidebending with pain  Right Shoulder Flexion  84 Degrees    Right Shoulder ABduction  68 Degrees    Left Shoulder Flexion  135 Degrees    Left Shoulder ABduction  105 Degrees      Strength   Right/Left Shoulder  Right;Left    Right Shoulder Flexion  3/5    Right Shoulder ABduction  3/5    Left Shoulder Flexion  4/5    Left Shoulder ABduction  4/5    Right/Left Hip  Right;Left    Right Hip Flexion  4/5    Right Hip Extension  4/5    Right Hip ABduction  2+/5    Left Hip Flexion  4/5    Left Hip Extension  4/5    Left Hip ABduction  2+/5      Palpation   Palpation comment  liver resection incision is well-healed and mobile; also has old hysterectomy incision      Special Tests   Other special tests  with activity, O2 sat 95 and HR 85, but pt appears to be dyspneic       Ambulation/Gait   Ambulation/Gait  Yes    Ambulation/Gait Assistance  6: Modified independent (Device/Increase time)   mild gait abnormalities   Gait Comments  right shoulder elevated with slight lean; slightly unstead; no AD;       Timed Up and Go Test   Normal TUG (seconds)  8.7             Quick Dash - 11/07/17 0001    Open a tight or new jar  No difficulty    Do heavy household chores (wash walls, wash floors)  Unable    Carry a shopping bag or briefcase  Mild difficulty    Wash your back  Mild difficulty    Use a knife to cut food  No difficulty    Recreational activities in which you take some force or impact through your arm, shoulder, or hand (golf, hammering, tennis)  Severe difficulty    During the past week, to what extent has your arm, shoulder or hand problem interfered with your normal social activities with family, friends, neighbors, or groups?  Quite a bit    During the past week, to what extent has your arm, shoulder or hand problem limited your work or other regular daily activities  Quite a bit    Arm, shoulder, or hand pain.  Mild    Tingling (pins and needles) in your arm, shoulder, or hand  Mild     Difficulty Sleeping  No difficulty    DASH Score  38.64 %        Objective measurements completed on examination: See above findings.      [place] Adult PT Treatment/Exercise - 11/07/17 0001      Lumbar Exercises: Supine   Ab Set  5 reps    Bent Knee Raise  5 reps;Limitations    Bent Knee Raise Limitations  raised legs just a few degrees with cues to try to get her to feel abdominal activity     Dead Bug  5 reps    Dead Bug Limitations  needed extra verbal and tactile cues     Other Supine Lumbar Exercises  pt appears to be more comfortable in supine positions.  Multiple verbal cues with hand over hand cues to help her feel abdominal contractions.                PT Short Term Goals - 08/18/15 1317  PT SHORT TERM GOAL #1   Title  Patient will be independent in a safe exercise program to positively affect her osteoporosis and back pain, including safe core and limb strengthening    Time  3    Period  Weeks    Status  Achieved        PT Long Term Goals - 11/07/17 1512      PT LONG TERM GOAL #1   Title  Pt will be independent in a home exercise program for core strength     Time  4    Period  Weeks    Status  New      PT LONG TERM GOAL #2   Title  Pt will report that she has  less pain with mobility at home  ( decreased to 5/10)    Time  4    Period  Weeks    Status  New      PT LONG TERM GOAL #3   Title  Pt will decrease her TUG score to < 7.5 seconds indicating an improvement in functional mobility and balance     Baseline  8.7    Time  4    Period  Weeks    Status  New             Plan - 11/07/17 1501    Clinical Impression Statement  Pt comes to PT wanting to work on her "body core issues" so that she can hold herself up better so she can walk better.  She has had physical therapy before for her severe osteoporosis.  She has limted right shoulder motion from and old injury and has dyspnea from COPD.  She has severe osteoporosis with pain from  multiple fractures and severe kyphosis with limited spinal mobility.   She has muscle weakness in her hip abductors as well as generalized muscle atrophy     History and Personal Factors relevant to plan of care:  lives near [place], chronic pain with medication use multiple fractures from osteoporosis, personal histroy of liver cancer     Clinical Presentation  Stable    Clinical Presentation due to:  no ongoing cancer treatment     Clinical Decision Making  Low    Rehab Potential  Good    Clinical Impairments Affecting Rehab Potential  apparent COPD; osteoporosis with h/o fractures    PT Frequency  2x / week    PT Duration  4 weeks    PT Treatment/Interventions  ADLs/Self Care Home Management;Moist Heat;DME Instruction;Functional mobility training;Therapeutic activities;Therapeutic exercise;Patient/family education;Manual techniques;Scar mobilization;Other (comment);Gait training;Stair training;Neuromuscular re-education;Balance training    PT Next Visit Plan  Begin supine core exercises, strengthening to hip abductors teach and progress HEP     Consulted and Agree with Plan of Care  Patient       Patient will benefit from skilled therapeutic intervention in order to improve the following deficits and impairments:  Pain, Postural dysfunction, Abnormal gait, Decreased strength, Other (comment), Decreased scar mobility, Hypomobility, Decreased mobility, Decreased range of motion, Decreased activity tolerance, Difficulty walking  Visit Diagnosis: Abnormal posture - Plan: PT plan of care cert/re-cert  Muscle weakness (generalized) - Plan: PT plan of care cert/re-cert     Problem List Patient Active Problem List   Diagnosis Date Noted  . Chronic thoracic back pain 03/31/2017  . Physical exam 01/09/2017  . Malnutrition of moderate degree 12/22/2016  . Anxiety about health 09/27/2016  . Osteoporosis 07/13/2016  . Chronic narcotic use  07/13/2016  . Constipation 04/26/2015  . Chronic pain  04/26/2015  . Depression 04/26/2015  . COPD GOLD IV / still somking 03/03/2015  . Cigarette smoker 03/03/2015  . Cough 03/03/2015  . Hepatocellular carcinoma ([place]) 12/30/2014  . Pancreatic mass 12/04/2014  . Blue toe syndrome ([place]) 11/27/2014   Donato Heinz. Owens Shark PT  Norwood Levo 11/07/2017, 5:05 PM  [place], [place], [address] Phone: [phone]   Fax:  [phone]  Name: Tina Bailey MRN: [id number] Date of Birth: 1945/12/26

## 2017-11-08 DIAGNOSIS — Z79891 Long term (current) use of opiate analgesic: Secondary | ICD-10-CM | POA: Diagnosis not present

## 2017-11-08 DIAGNOSIS — M4854XD Collapsed vertebra, not elsewhere classified, thoracic region, subsequent encounter for fracture with routine healing: Secondary | ICD-10-CM | POA: Diagnosis not present

## 2017-11-13 ENCOUNTER — Telehealth: Payer: Self-pay | Admitting: General Practice

## 2017-11-13 ENCOUNTER — Ambulatory Visit: Payer: Medicare Other | Admitting: Family Medicine

## 2017-11-13 NOTE — Telephone Encounter (Signed)
I am sorry pt is confused when we just want to eliminate unnecessary steps for her (we cannot do xrays here- Dr Nelva Bush can.  We cannot do pain medications- Dr Nelva Bush can).

## 2017-11-13 NOTE — Telephone Encounter (Signed)
Called and spoke with pt to find out about her back pain. Pt stated that she woke up Sunday and had some pain, which worsened by this morning. She stated that she thought she might have a "fractured back or disc slippage", but would not know without an xray. I had asked pt if she had called Dr. Nelva Bush' office since he is treating her back issues and providing pain medication. She said no she was under the assumption that per her insurance she had to see her PCP for xrays before she could see Dr. Nelva Bush.   I advised that Dr. Nelva Bush is the treating physician for her back issues. PT said she did not understand our office and hung up the phone.

## 2017-11-14 ENCOUNTER — Encounter: Payer: Medicare Other | Admitting: Physical Therapy

## 2017-11-14 DIAGNOSIS — M4854XD Collapsed vertebra, not elsewhere classified, thoracic region, subsequent encounter for fracture with routine healing: Secondary | ICD-10-CM | POA: Diagnosis not present

## 2017-11-14 DIAGNOSIS — Z79891 Long term (current) use of opiate analgesic: Secondary | ICD-10-CM | POA: Diagnosis not present

## 2017-11-14 DIAGNOSIS — M810 Age-related osteoporosis without current pathological fracture: Secondary | ICD-10-CM | POA: Diagnosis not present

## 2017-11-14 DIAGNOSIS — M546 Pain in thoracic spine: Secondary | ICD-10-CM | POA: Diagnosis not present

## 2017-11-17 ENCOUNTER — Encounter: Payer: Medicare Other | Admitting: Physical Therapy

## 2017-12-04 ENCOUNTER — Encounter (HOSPITAL_COMMUNITY)
Admission: EM | Disposition: A | Discharge status: Home Health | Payer: Self-pay | Source: Home / Self Care | Attending: Internal Medicine

## 2017-12-04 ENCOUNTER — Encounter (HOSPITAL_COMMUNITY): Payer: Self-pay | Admitting: Emergency Medicine

## 2017-12-04 ENCOUNTER — Encounter: Payer: Medicare Other | Admitting: Physical Therapy

## 2017-12-04 ENCOUNTER — Emergency Department (HOSPITAL_COMMUNITY): Payer: Medicare Other | Admitting: Anesthesiology

## 2017-12-04 ENCOUNTER — Emergency Department (HOSPITAL_COMMUNITY): Payer: Medicare Other

## 2017-12-04 ENCOUNTER — Inpatient Hospital Stay (HOSPITAL_COMMUNITY): Payer: Medicare Other

## 2017-12-04 ENCOUNTER — Inpatient Hospital Stay (HOSPITAL_COMMUNITY)
Admission: EM | Admit: 2017-12-04 | Discharge: 2017-12-06 | DRG: 480 | Disposition: A | Discharge status: Home Health | Payer: Medicare Other | Attending: Internal Medicine | Admitting: Internal Medicine

## 2017-12-04 ENCOUNTER — Other Ambulatory Visit: Payer: Self-pay

## 2017-12-04 DIAGNOSIS — I739 Peripheral vascular disease, unspecified: Secondary | ICD-10-CM | POA: Diagnosis present

## 2017-12-04 DIAGNOSIS — D62 Acute posthemorrhagic anemia: Secondary | ICD-10-CM | POA: Diagnosis not present

## 2017-12-04 DIAGNOSIS — J449 Chronic obstructive pulmonary disease, unspecified: Secondary | ICD-10-CM | POA: Diagnosis present

## 2017-12-04 DIAGNOSIS — Z880 Allergy status to penicillin: Secondary | ICD-10-CM

## 2017-12-04 DIAGNOSIS — G8929 Other chronic pain: Secondary | ICD-10-CM | POA: Diagnosis present

## 2017-12-04 DIAGNOSIS — D696 Thrombocytopenia, unspecified: Secondary | ICD-10-CM

## 2017-12-04 DIAGNOSIS — Y92009 Unspecified place in unspecified non-institutional (private) residence as the place of occurrence of the external cause: Secondary | ICD-10-CM | POA: Diagnosis not present

## 2017-12-04 DIAGNOSIS — G8918 Other acute postprocedural pain: Secondary | ICD-10-CM | POA: Diagnosis not present

## 2017-12-04 DIAGNOSIS — R000 Tachycardia, unspecified: Secondary | ICD-10-CM | POA: Diagnosis not present

## 2017-12-04 DIAGNOSIS — S72001A Fracture of unspecified part of neck of right femur, initial encounter for closed fracture: Secondary | ICD-10-CM

## 2017-12-04 DIAGNOSIS — M546 Pain in thoracic spine: Secondary | ICD-10-CM | POA: Diagnosis present

## 2017-12-04 DIAGNOSIS — H3530 Unspecified macular degeneration: Secondary | ICD-10-CM | POA: Diagnosis present

## 2017-12-04 DIAGNOSIS — Z66 Do not resuscitate: Secondary | ICD-10-CM | POA: Diagnosis present

## 2017-12-04 DIAGNOSIS — W010XXA Fall on same level from slipping, tripping and stumbling without subsequent striking against object, initial encounter: Secondary | ICD-10-CM | POA: Diagnosis present

## 2017-12-04 DIAGNOSIS — Z7951 Long term (current) use of inhaled steroids: Secondary | ICD-10-CM | POA: Diagnosis not present

## 2017-12-04 DIAGNOSIS — Z681 Body mass index (BMI) 19 or less, adult: Secondary | ICD-10-CM | POA: Diagnosis not present

## 2017-12-04 DIAGNOSIS — Z79899 Other long term (current) drug therapy: Secondary | ICD-10-CM | POA: Diagnosis not present

## 2017-12-04 DIAGNOSIS — E869 Volume depletion, unspecified: Secondary | ICD-10-CM | POA: Diagnosis present

## 2017-12-04 DIAGNOSIS — E43 Unspecified severe protein-calorie malnutrition: Secondary | ICD-10-CM | POA: Diagnosis present

## 2017-12-04 DIAGNOSIS — R0902 Hypoxemia: Secondary | ICD-10-CM | POA: Diagnosis not present

## 2017-12-04 DIAGNOSIS — J441 Chronic obstructive pulmonary disease with (acute) exacerbation: Secondary | ICD-10-CM | POA: Diagnosis not present

## 2017-12-04 DIAGNOSIS — K869 Disease of pancreas, unspecified: Secondary | ICD-10-CM | POA: Diagnosis not present

## 2017-12-04 DIAGNOSIS — Z09 Encounter for follow-up examination after completed treatment for conditions other than malignant neoplasm: Secondary | ICD-10-CM

## 2017-12-04 DIAGNOSIS — Z882 Allergy status to sulfonamides status: Secondary | ICD-10-CM

## 2017-12-04 DIAGNOSIS — K8689 Other specified diseases of pancreas: Secondary | ICD-10-CM | POA: Diagnosis present

## 2017-12-04 DIAGNOSIS — F17210 Nicotine dependence, cigarettes, uncomplicated: Secondary | ICD-10-CM | POA: Diagnosis present

## 2017-12-04 DIAGNOSIS — S299XXA Unspecified injury of thorax, initial encounter: Secondary | ICD-10-CM | POA: Diagnosis not present

## 2017-12-04 DIAGNOSIS — D509 Iron deficiency anemia, unspecified: Secondary | ICD-10-CM | POA: Diagnosis present

## 2017-12-04 DIAGNOSIS — Z888 Allergy status to other drugs, medicaments and biological substances status: Secondary | ICD-10-CM | POA: Diagnosis not present

## 2017-12-04 DIAGNOSIS — S2232XD Fracture of one rib, left side, subsequent encounter for fracture with routine healing: Secondary | ICD-10-CM | POA: Diagnosis not present

## 2017-12-04 DIAGNOSIS — Z419 Encounter for procedure for purposes other than remedying health state, unspecified: Secondary | ICD-10-CM

## 2017-12-04 DIAGNOSIS — S72141D Displaced intertrochanteric fracture of right femur, subsequent encounter for closed fracture with routine healing: Secondary | ICD-10-CM | POA: Diagnosis not present

## 2017-12-04 DIAGNOSIS — C220 Liver cell carcinoma: Secondary | ICD-10-CM | POA: Diagnosis present

## 2017-12-04 DIAGNOSIS — M810 Age-related osteoporosis without current pathological fracture: Secondary | ICD-10-CM | POA: Diagnosis present

## 2017-12-04 DIAGNOSIS — M797 Fibromyalgia: Secondary | ICD-10-CM | POA: Diagnosis present

## 2017-12-04 DIAGNOSIS — R079 Chest pain, unspecified: Secondary | ICD-10-CM | POA: Diagnosis not present

## 2017-12-04 DIAGNOSIS — Z881 Allergy status to other antibiotic agents status: Secondary | ICD-10-CM

## 2017-12-04 DIAGNOSIS — R52 Pain, unspecified: Secondary | ICD-10-CM | POA: Diagnosis not present

## 2017-12-04 DIAGNOSIS — S72141A Displaced intertrochanteric fracture of right femur, initial encounter for closed fracture: Secondary | ICD-10-CM | POA: Diagnosis present

## 2017-12-04 DIAGNOSIS — W19XXXA Unspecified fall, initial encounter: Secondary | ICD-10-CM | POA: Diagnosis not present

## 2017-12-04 DIAGNOSIS — S7291XA Unspecified fracture of right femur, initial encounter for closed fracture: Secondary | ICD-10-CM | POA: Diagnosis not present

## 2017-12-04 DIAGNOSIS — S3289XA Fracture of other parts of pelvis, initial encounter for closed fracture: Secondary | ICD-10-CM | POA: Diagnosis not present

## 2017-12-04 HISTORY — PX: INTRAMEDULLARY (IM) NAIL INTERTROCHANTERIC: SHX5875

## 2017-12-04 LAB — CBC WITH DIFFERENTIAL/PLATELET
Basophils Absolute: 0 10*3/uL (ref 0.0–0.1)
Basophils Relative: 0 %
Eosinophils Absolute: 0 10*3/uL (ref 0.0–0.7)
Eosinophils Relative: 0 %
HCT: 43.8 % (ref 36.0–46.0)
Hemoglobin: 14.4 g/dL (ref 12.0–15.0)
Lymphocytes Relative: 7 %
Lymphs Abs: 1.3 10*3/uL (ref 0.7–4.0)
MCH: 30.7 pg (ref 26.0–34.0)
MCHC: 32.9 g/dL (ref 30.0–36.0)
MCV: 93.4 fL (ref 78.0–100.0)
Monocytes Absolute: 1.2 10*3/uL — ABNORMAL HIGH (ref 0.1–1.0)
Monocytes Relative: 6 %
Neutro Abs: 16.5 10*3/uL — ABNORMAL HIGH (ref 1.7–7.7)
Neutrophils Relative %: 87 %
Platelets: 211 10*3/uL (ref 150–400)
RBC: 4.69 MIL/uL (ref 3.87–5.11)
RDW: 13.2 % (ref 11.5–15.5)
WBC: 19.1 10*3/uL — ABNORMAL HIGH (ref 4.0–10.5)

## 2017-12-04 LAB — BASIC METABOLIC PANEL
Anion gap: 9 (ref 5–15)
BUN: 19 mg/dL (ref 8–23)
CO2: 31 mmol/L (ref 22–32)
Calcium: 9.2 mg/dL (ref 8.9–10.3)
Chloride: 97 mmol/L — ABNORMAL LOW (ref 98–111)
Creatinine, Ser: 0.57 mg/dL (ref 0.44–1.00)
GFR calc Af Amer: >60 mL/min (ref >60)
GFR calc non Af Amer: >60 mL/min (ref >60)
Glucose, Bld: 156 mg/dL — ABNORMAL HIGH (ref 70–99)
Potassium: 4.5 mmol/L (ref 3.5–5.1)
Sodium: 137 mmol/L (ref 135–145)

## 2017-12-04 LAB — TYPE AND SCREEN
ABO/RH(D): A POS
Antibody Screen: NEGATIVE

## 2017-12-04 LAB — SURGICAL PCR SCREEN
MRSA, PCR: NEGATIVE
Staphylococcus aureus: NEGATIVE

## 2017-12-04 SURGERY — FIXATION, FRACTURE, INTERTROCHANTERIC, WITH INTRAMEDULLARY ROD
Anesthesia: Spinal | Site: Leg Upper | Laterality: Right

## 2017-12-04 MED ORDER — MIDAZOLAM HCL 2 MG/2ML IJ SOLN
INTRAMUSCULAR | Status: AC
  Filled 2017-12-04: qty 2

## 2017-12-04 MED ORDER — PROPOFOL 10 MG/ML IV BOLUS
INTRAVENOUS | Status: DC | PRN
  Administered 2017-12-04 (×3): 10 mg via INTRAVENOUS

## 2017-12-04 MED ORDER — MORPHINE SULFATE (PF) 2 MG/ML IV SOLN
1.0000 mg | INTRAVENOUS | Status: DC | PRN
  Administered 2017-12-04 (×2): 1 mg via INTRAVENOUS
  Filled 2017-12-04 (×2): qty 1

## 2017-12-04 MED ORDER — PROMETHAZINE HCL 25 MG/ML IJ SOLN
12.5000 mg | Freq: Four times a day (QID) | INTRAMUSCULAR | Status: DC | PRN
  Administered 2017-12-04: 12.5 mg via INTRAVENOUS
  Filled 2017-12-04 (×2): qty 1

## 2017-12-04 MED ORDER — METOCLOPRAMIDE HCL 5 MG PO TABS: 5.0000 mg | ORAL_TABLET | Freq: Three times a day (TID) | ORAL | Status: DC | PRN

## 2017-12-04 MED ORDER — LORAZEPAM 1 MG PO TABS
1.0000 mg | ORAL_TABLET | Freq: Four times a day (QID) | ORAL | Status: DC | PRN
  Administered 2017-12-05: 1 mg via ORAL
  Filled 2017-12-04: qty 1

## 2017-12-04 MED ORDER — ACETAMINOPHEN 10 MG/ML IV SOLN: 750.0000 mg | Freq: Once | INTRAVENOUS | Status: DC

## 2017-12-04 MED ORDER — IPRATROPIUM-ALBUTEROL 0.5-2.5 (3) MG/3ML IN SOLN: 3.0000 mL | RESPIRATORY_TRACT | Status: DC | PRN

## 2017-12-04 MED ORDER — CHLORHEXIDINE GLUCONATE 4 % EX LIQD: 60.0000 mL | Freq: Once | CUTANEOUS | Status: DC

## 2017-12-04 MED ORDER — VITAMIN B-12 1000 MCG PO TABS
1000.0000 ug | ORAL_TABLET | Freq: Every day | ORAL | Status: DC
  Administered 2017-12-05 – 2017-12-06 (×2): 1000 ug via ORAL
  Filled 2017-12-04 (×2): qty 1

## 2017-12-04 MED ORDER — IBUPROFEN 400 MG PO TABS
800.0000 mg | ORAL_TABLET | Freq: Four times a day (QID) | ORAL | Status: DC | PRN
  Administered 2017-12-05 (×2): 800 mg via ORAL
  Filled 2017-12-04 (×2): qty 2

## 2017-12-04 MED ORDER — SODIUM CHLORIDE 0.9 % IV SOLN
INTRAVENOUS | Status: DC
  Administered 2017-12-04: 22:00:00 via INTRAVENOUS

## 2017-12-04 MED ORDER — FENTANYL CITRATE (PF) 100 MCG/2ML IJ SOLN
INTRAMUSCULAR | Status: AC
  Filled 2017-12-04: qty 2

## 2017-12-04 MED ORDER — SODIUM CHLORIDE 0.9 % IV SOLN
INTRAVENOUS | Status: DC | PRN
  Administered 2017-12-04: 25 ug/min via INTRAVENOUS

## 2017-12-04 MED ORDER — BUPROPION HCL ER (XL) 150 MG PO TB24
150.0000 mg | ORAL_TABLET | Freq: Every day | ORAL | Status: DC
  Administered 2017-12-05 – 2017-12-06 (×2): 150 mg via ORAL
  Filled 2017-12-04 (×2): qty 1

## 2017-12-04 MED ORDER — CEFAZOLIN SODIUM-DEXTROSE 2-4 GM/100ML-% IV SOLN
2.0000 g | INTRAVENOUS | Status: AC
  Administered 2017-12-04: 2 g via INTRAVENOUS
  Filled 2017-12-04: qty 100

## 2017-12-04 MED ORDER — ENOXAPARIN SODIUM 30 MG/0.3ML ~~LOC~~ SOLN
30.0000 mg | SUBCUTANEOUS | Status: DC
  Administered 2017-12-05 – 2017-12-06 (×2): 30 mg via SUBCUTANEOUS
  Filled 2017-12-04 (×2): qty 0.3

## 2017-12-04 MED ORDER — DOCUSATE SODIUM 100 MG PO CAPS
100.0000 mg | ORAL_CAPSULE | Freq: Two times a day (BID) | ORAL | Status: DC
  Administered 2017-12-04 – 2017-12-06 (×4): 100 mg via ORAL
  Filled 2017-12-04 (×4): qty 1

## 2017-12-04 MED ORDER — ONDANSETRON HCL 4 MG/2ML IJ SOLN
INTRAMUSCULAR | Status: DC | PRN
  Administered 2017-12-04: 4 mg via INTRAVENOUS

## 2017-12-04 MED ORDER — ISOPROPYL ALCOHOL 70 % SOLN
Status: DC | PRN
  Administered 2017-12-04: 1 via TOPICAL

## 2017-12-04 MED ORDER — UMECLIDINIUM BROMIDE 62.5 MCG/INH IN AEPB
1.0000 | INHALATION_SPRAY | Freq: Every day | RESPIRATORY_TRACT | Status: DC
  Filled 2017-12-04: qty 7

## 2017-12-04 MED ORDER — ONDANSETRON HCL 4 MG/2ML IJ SOLN
INTRAMUSCULAR | Status: AC
  Filled 2017-12-04: qty 2

## 2017-12-04 MED ORDER — QUETIAPINE FUMARATE 25 MG PO TABS: 12.5000 mg | ORAL_TABLET | Freq: Every day | ORAL | Status: DC

## 2017-12-04 MED ORDER — ACETAMINOPHEN 325 MG PO TABS: 325.0000 mg | ORAL_TABLET | Freq: Four times a day (QID) | ORAL | Status: DC | PRN

## 2017-12-04 MED ORDER — PROPOFOL 10 MG/ML IV BOLUS
INTRAVENOUS | Status: AC
  Filled 2017-12-04: qty 20

## 2017-12-04 MED ORDER — CALCIUM CARBONATE-VITAMIN D 500-200 MG-UNIT PO TABS
2.0000 | ORAL_TABLET | Freq: Two times a day (BID) | ORAL | Status: DC
  Administered 2017-12-04 – 2017-12-06 (×4): 2 via ORAL
  Filled 2017-12-04 (×4): qty 2

## 2017-12-04 MED ORDER — POVIDONE-IODINE 10 % EX SWAB: 2.0000 "application " | Freq: Once | CUTANEOUS | Status: DC

## 2017-12-04 MED ORDER — ARFORMOTEROL TARTRATE 15 MCG/2ML IN NEBU
15.0000 ug | INHALATION_SOLUTION | Freq: Two times a day (BID) | RESPIRATORY_TRACT | Status: DC
  Filled 2017-12-04 (×5): qty 2

## 2017-12-04 MED ORDER — METOCLOPRAMIDE HCL 5 MG/ML IJ SOLN: 5.0000 mg | Freq: Three times a day (TID) | INTRAMUSCULAR | Status: DC | PRN

## 2017-12-04 MED ORDER — ONDANSETRON HCL 4 MG/2ML IJ SOLN: 4.0000 mg | Freq: Four times a day (QID) | INTRAMUSCULAR | Status: DC | PRN

## 2017-12-04 MED ORDER — MORPHINE SULFATE (PF) 4 MG/ML IV SOLN
4.0000 mg | Freq: Once | INTRAVENOUS | Status: AC
  Administered 2017-12-04: 4 mg via INTRAVENOUS
  Filled 2017-12-04: qty 1

## 2017-12-04 MED ORDER — VITAMIN D 1000 UNITS PO TABS
5000.0000 [IU] | ORAL_TABLET | Freq: Every day | ORAL | Status: DC
  Administered 2017-12-05 – 2017-12-06 (×2): 5000 [IU] via ORAL
  Filled 2017-12-04 (×3): qty 5

## 2017-12-04 MED ORDER — SODIUM CHLORIDE 0.9 % IR SOLN
Status: DC | PRN
  Administered 2017-12-04: 1000 mL

## 2017-12-04 MED ORDER — PHENOL 1.4 % MT LIQD: 1.0000 | OROMUCOSAL | Status: DC | PRN

## 2017-12-04 MED ORDER — ISOPROPYL ALCOHOL 70 % SOLN
Status: AC
  Filled 2017-12-04: qty 480

## 2017-12-04 MED ORDER — OXYCODONE HCL 5 MG PO TABS
10.0000 mg | ORAL_TABLET | Freq: Four times a day (QID) | ORAL | Status: DC | PRN
  Administered 2017-12-05 – 2017-12-06 (×2): 10 mg via ORAL
  Filled 2017-12-04 (×2): qty 2

## 2017-12-04 MED ORDER — MIDAZOLAM HCL 2 MG/2ML IJ SOLN: 1.0000 mg | INTRAMUSCULAR | Status: DC

## 2017-12-04 MED ORDER — ACETAMINOPHEN 10 MG/ML IV SOLN
INTRAVENOUS | Status: AC
  Filled 2017-12-04: qty 100

## 2017-12-04 MED ORDER — CEFAZOLIN SODIUM-DEXTROSE 2-4 GM/100ML-% IV SOLN
2.0000 g | Freq: Three times a day (TID) | INTRAVENOUS | Status: AC
  Administered 2017-12-05 (×2): 2 g via INTRAVENOUS
  Filled 2017-12-04 (×2): qty 100

## 2017-12-04 MED ORDER — MIDAZOLAM HCL 2 MG/2ML IJ SOLN
INTRAMUSCULAR | Status: DC | PRN
  Administered 2017-12-04: 1 mg via INTRAVENOUS

## 2017-12-04 MED ORDER — FENTANYL CITRATE (PF) 100 MCG/2ML IJ SOLN: 50.0000 ug | INTRAMUSCULAR | Status: DC

## 2017-12-04 MED ORDER — HYDROMORPHONE HCL 1 MG/ML IJ SOLN
0.5000 mg | Freq: Once | INTRAMUSCULAR | Status: AC
  Administered 2017-12-04: 0.5 mg via INTRAVENOUS
  Filled 2017-12-04: qty 1

## 2017-12-04 MED ORDER — POLYVINYL ALCOHOL 1.4 % OP SOLN
1.0000 [drp] | Freq: Every day | OPHTHALMIC | Status: DC | PRN
  Filled 2017-12-04: qty 15

## 2017-12-04 MED ORDER — IBUPROFEN 400 MG PO TABS: 800.0000 mg | ORAL_TABLET | ORAL | Status: DC | PRN

## 2017-12-04 MED ORDER — BUPIVACAINE HCL (PF) 0.25 % IJ SOLN
30.0000 mL | Freq: Once | INTRAMUSCULAR | Status: AC
  Administered 2017-12-04: 30 mL
  Filled 2017-12-04: qty 30

## 2017-12-04 MED ORDER — PROPOFOL 500 MG/50ML IV EMUL
INTRAVENOUS | Status: DC | PRN
  Administered 2017-12-04: 15 ug/kg/min via INTRAVENOUS

## 2017-12-04 MED ORDER — HYDROCODONE-ACETAMINOPHEN 5-325 MG PO TABS
1.0000 | ORAL_TABLET | ORAL | Status: DC | PRN
  Administered 2017-12-04 – 2017-12-05 (×3): 2 via ORAL
  Filled 2017-12-04 (×3): qty 2

## 2017-12-04 MED ORDER — BUPIVACAINE IN DEXTROSE 0.75-8.25 % IT SOLN
INTRATHECAL | Status: DC | PRN
  Administered 2017-12-04: 1.4 mL via INTRATHECAL

## 2017-12-04 MED ORDER — LIDOCAINE 2% (20 MG/ML) 5 ML SYRINGE
INTRAMUSCULAR | Status: DC | PRN
  Administered 2017-12-04: 40 mg via INTRAVENOUS

## 2017-12-04 MED ORDER — FENTANYL CITRATE (PF) 100 MCG/2ML IJ SOLN
25.0000 ug | INTRAMUSCULAR | Status: DC | PRN
  Administered 2017-12-04: 12.5 ug via INTRAVENOUS

## 2017-12-04 MED ORDER — LACTATED RINGERS IV SOLN
INTRAVENOUS | Status: DC
  Administered 2017-12-04 (×3): via INTRAVENOUS

## 2017-12-04 MED ORDER — DEXAMETHASONE SODIUM PHOSPHATE 10 MG/ML IJ SOLN
INTRAMUSCULAR | Status: DC | PRN
  Administered 2017-12-04: 5 mg via INTRAVENOUS

## 2017-12-04 MED ORDER — HYDROCODONE-ACETAMINOPHEN 7.5-325 MG PO TABS
1.0000 | ORAL_TABLET | ORAL | Status: DC | PRN
  Administered 2017-12-05 – 2017-12-06 (×4): 2 via ORAL
  Filled 2017-12-04 (×4): qty 2

## 2017-12-04 MED ORDER — ONDANSETRON HCL 4 MG PO TABS: 4.0000 mg | ORAL_TABLET | Freq: Four times a day (QID) | ORAL | Status: DC | PRN

## 2017-12-04 MED ORDER — FENTANYL CITRATE (PF) 100 MCG/2ML IJ SOLN
INTRAMUSCULAR | Status: DC | PRN
  Administered 2017-12-04: 50 ug via INTRAVENOUS

## 2017-12-04 MED ORDER — MENTHOL 3 MG MT LOZG: 1.0000 | LOZENGE | OROMUCOSAL | Status: DC | PRN

## 2017-12-04 MED ORDER — MORPHINE SULFATE (PF) 2 MG/ML IV SOLN: 0.5000 mg | INTRAVENOUS | Status: DC | PRN

## 2017-12-04 SURGICAL SUPPLY — 51 items
BAG ZIPLOCK 12X15 (MISCELLANEOUS) IMPLANT
BIT DRILL CANN LG 4.3MM (BIT) ×1 IMPLANT
CHLORAPREP W/TINT 26ML (MISCELLANEOUS) ×2 IMPLANT
COVER MAYO STAND STRL (DRAPES) ×2 IMPLANT
COVER PERINEAL POST (MISCELLANEOUS) ×2 IMPLANT
COVER SURGICAL LIGHT HANDLE (MISCELLANEOUS) ×2 IMPLANT
DERMABOND ADVANCED (GAUZE/BANDAGES/DRESSINGS) ×1
DERMABOND ADVANCED .7 DNX12 (GAUZE/BANDAGES/DRESSINGS) ×1 IMPLANT
DRAPE C-ARM 42X120 X-RAY (DRAPES) ×2 IMPLANT
DRAPE C-ARMOR (DRAPES) ×2 IMPLANT
DRAPE IMP U-DRAPE 54X76 (DRAPES) ×4 IMPLANT
DRAPE SHEET LG 3/4 BI-LAMINATE (DRAPES) ×4 IMPLANT
DRAPE STERI IOBAN 125X83 (DRAPES) ×2 IMPLANT
DRAPE U-SHAPE 47X51 STRL (DRAPES) ×4 IMPLANT
DRESSING AQUACEL AG SP 3.5X6 (GAUZE/BANDAGES/DRESSINGS) ×1 IMPLANT
DRILL BIT CANN LG 4.3MM (BIT) ×2
DRSG AQUACEL AG ADV 3.5X 4 (GAUZE/BANDAGES/DRESSINGS) ×2 IMPLANT
DRSG AQUACEL AG SP 3.5X6 (GAUZE/BANDAGES/DRESSINGS) ×2
DRSG MEPILEX BORDER 4X4 (GAUZE/BANDAGES/DRESSINGS) ×4 IMPLANT
DRSG MEPILEX BORDER 4X8 (GAUZE/BANDAGES/DRESSINGS) IMPLANT
ELECT BLADE TIP CTD 4 INCH (ELECTRODE) IMPLANT
FACESHIELD WRAPAROUND (MASK) ×4 IMPLANT
GAUZE SPONGE 4X4 12PLY STRL (GAUZE/BANDAGES/DRESSINGS) ×2 IMPLANT
GLOVE BIO SURGEON STRL SZ7 (GLOVE) ×2 IMPLANT
GLOVE BIO SURGEON STRL SZ8.5 (GLOVE) ×4 IMPLANT
GLOVE BIOGEL PI IND STRL 7.0 (GLOVE) ×1 IMPLANT
GLOVE BIOGEL PI IND STRL 7.5 (GLOVE) ×1 IMPLANT
GLOVE BIOGEL PI IND STRL 8 (GLOVE) ×1 IMPLANT
GLOVE BIOGEL PI IND STRL 8.5 (GLOVE) ×1 IMPLANT
GLOVE BIOGEL PI INDICATOR 7.0 (GLOVE) ×1
GLOVE BIOGEL PI INDICATOR 7.5 (GLOVE) ×1
GLOVE BIOGEL PI INDICATOR 8 (GLOVE) ×1
GLOVE BIOGEL PI INDICATOR 8.5 (GLOVE) ×1
GLOVE ECLIPSE 7.5 STRL STRAW (GLOVE) ×2 IMPLANT
GOWN SPEC L3 XXLG W/TWL (GOWN DISPOSABLE) ×2 IMPLANT
GOWN STRL REUS W/ TWL XL LVL3 (GOWN DISPOSABLE) ×1 IMPLANT
GOWN STRL REUS W/TWL LRG LVL3 (GOWN DISPOSABLE) ×2 IMPLANT
GOWN STRL REUS W/TWL XL LVL3 (GOWN DISPOSABLE) ×1
GUIDEPIN 3.2X17.5 THRD DISP (PIN) ×2 IMPLANT
KIT BASIN OR (CUSTOM PROCEDURE TRAY) ×2 IMPLANT
MANIFOLD NEPTUNE II (INSTRUMENTS) ×2 IMPLANT
MARKER SKIN DUAL TIP RULER LAB (MISCELLANEOUS) ×2 IMPLANT
NAIL HIP FRACT 130D 11X180 (Screw) ×2 IMPLANT
PACK GENERAL/GYN (CUSTOM PROCEDURE TRAY) ×2 IMPLANT
SCREW BONE CORTICAL 5.0X32 (Screw) ×2 IMPLANT
SCREW LAG 10.5MMX105MM HFN (Screw) ×2 IMPLANT
SUT MNCRL AB 3-0 PS2 18 (SUTURE) ×2 IMPLANT
SUT MON AB 2-0 CT1 36 (SUTURE) ×2 IMPLANT
SUT VIC AB 1 CT1 36 (SUTURE) ×2 IMPLANT
TOWEL OR 17X26 10 PK STRL BLUE (TOWEL DISPOSABLE) ×2 IMPLANT
TRAY FOLEY CATH 14FR (SET/KITS/TRAYS/PACK) ×2 IMPLANT

## 2017-12-04 NOTE — H&P (Signed)
HPI  Tina Bailey UDJ:[id number] DOB: 1945/10/08 DOA: 12/04/2017  PCP: Midge Minium, MD   Chief Complaint: accidental fall  HPI:  34 f recently diagnosed liver mass follows with Dr. Annamaria Boots please see her most recent progress note 10/10/2017--liver right lobe hepatectomy pain poorly differentiated HCC 8.5 cm Chronic thoracic back pain followed by Dr. Herma Mering pain management Cancer related cachexia COPD Gold stage III follows with Dr. Melvyn Novas still smoker Peripheral arterial disease last ABIs 07/10/2017-right side 0.6, left side 0.78 Last hospital admission 12/22/2016 for acute respiratory failure with hypoxia   Walking at home and tripped over her dog in the dark-try to keep balance-fell and could not move-had severe pain and was unable to get up-accidental fall without any prodrome-no recent fever chills or any illnesses to speak of-usually supposed to be using a walker outside of the house does not use it within the home-has been using a walker since January 2019  2 weeks prior had thoracic fractures T5-T6 followed by Dr. Herma Mering and given oxycodone for the same   ED Course: Patient and EMS truck was given fentanyl and in the ED was given Dilaudid-now feels nauseous  Review of Systems:  Negative for fever, endorses recent visual changes which she is being treated for has left eye blindness and corneal transplant on the right side, sore throat, rash, new muscle aches, chest pain, SOB, dysuria, bleeding, n/v/abdominal pain.  Past Medical History:  Diagnosis Date  . Arthritis    DDD. Right shoulder"is frozen"-limited ROM. osteoporosis.  . Blue toe syndrome ([place]) 11/27/2014   Dr. Claudia Pollock evaluating  . Cancer ([place]) 12/2014   liver cancer  . Complication of anesthesia   . COPD (chronic obstructive pulmonary disease) ([place])   . Fibromyalgia   . Fracture of rib of right side    hx "osteoporosis"- states her dog nudge her on the side, next day developed great pain and was told has  a fracture rib.  . Macular degeneration    R eye  . Osteoporosis   . PONV (postoperative nausea and vomiting)    nausea,severe vomiting after 01-13-15 portal vein embolization  . Productive cough   . Retina disorder    L eye, vision distorted, edema  . Spine fracture due to birth trauma   . TMJ disease   . Wears glasses     Past Surgical History:  Procedure Laterality Date  . ANGIOPLASTY  2008   no stents required, no follow-up with cardiologist, no recurrent chest pain  . CATARACT EXTRACTION, BILATERAL Bilateral   . ESOPHAGOGASTRODUODENOSCOPY (EGD) WITH PROPOFOL N/A 06/25/2015   Procedure: ESOPHAGOGASTRODUODENOSCOPY (EGD) WITH PROPOFOL;  Surgeon: Milus Banister, MD;  [facility];  Service: Endoscopy;  Laterality: N/A;  stent removal   . EYE SURGERY     cornea surgery  . IR RADIOLOGIST EVAL & MGMT  03/29/2017  . KIDNEY STONE SURGERY    . LAPAROSCOPIC PARTIAL HEPATECTOMY N/A 03/04/2015   Procedure: DIAGNOSTIC LAPAROSCOPY, EXTENDED RIGHT HEPATECTOMY, WITH INTRAOPERATIVE ULTRASOUND;  Surgeon: Stark Klein, MD;  Location: [facility];  Service: General;  Laterality: N/A;  . PARTIAL HYSTERECTOMY    . portal vein embolization     01-13-15 -Dr. Cathlean Sauer.     reports that she has been smoking cigarettes. She has a 22.50 pack-year smoking history. She has never used smokeless tobacco. She reports that she does not drink alcohol or use drugs. Mobility: As above Smokes 2 to 3 cigarettes a day No drinking Lives at home  alone   Allergies  Allergen Reactions  . Sertraline Hcl     Zoloft, had a terrible reaction  . Amoxicillin-Pot Clavulanate Nausea Only    Has patient had a PCN reaction causing immediate rash, facial/tongue/throat swelling, SOB or lightheadedness with hypotension: No Has patient had a PCN reaction causing severe rash involving mucus membranes or skin necrosis: No Has patient had a PCN reaction that required hospitalization No Has patient had a PCN  reaction occurring within the last 10 years: No If all of the above answers are "NO", then may proceed with Cephalosporin use.   . Clindamycin/Lincomycin Other (See Comments)    Felt like it "burned out" her stomach/took a large dose  . Paroxetine Hcl Rash  . Sulfa Antibiotics Rash    Family History  Problem Relation Age of Onset  . Hypertension Mother   . Atrial fibrillation Mother   . Heart disease Mother        after age 95  . Stroke Father   . Dementia Father   . Heart disease Brother        After age 59- A-Fib  . Heart attack Brother      Prior to Admission medications   Medication Sig Start Date End Date Taking? Authorizing Provider  buPROPion (WELLBUTRIN XL) 150 MG 24 hr tablet Take 150 mg by mouth daily.  10/17/17  Yes [provider]  Calcium Carbonate-Vitamin D (CALCIUM 500+D HIGH POTENCY PO) Take 1 tablet by mouth daily.    Yes [provider]  Glycopyrrolate-Formoterol (BEVESPI AEROSPHERE) 9-4.8 MCG/ACT AERO Inhale 2 puffs into the lungs 2 (two) times daily. Patient taking differently: Inhale 2 puffs into the lungs daily as needed (shortness of breath).  05/30/17  Yes Tanda Rockers, MD  Carboxymethylcellul-Glycerin (CLEAR EYES FOR DRY EYES) 1-0.25 % SOLN Place 1-2 drops into both eyes daily as needed.    [provider]  Cholecalciferol (VITAMIN D3) 5000 units CAPS Take 5,000 Units by mouth daily.    [provider]  Cyanocobalamin (VITAMIN B-12) 1000 MCG SUBL Place 1,000 mcg under the tongue daily.     [provider]  ibuprofen (ADVIL,MOTRIN) 200 MG tablet Take by mouth.    [provider]  ipratropium-albuterol (DUONEB) 0.5-2.5 (3) MG/3ML SOLN Take 3 mLs by nebulization every 4 (four) hours as needed.    [provider]  LORazepam (ATIVAN) 1 MG tablet TAKE 1-2 TABLETS (1-2 MG TOTAL) BY MOUTH ONCE FOR 1 DOSE. 10/10/17   [provider]  Multiple Vitamins-Minerals (CENTRUM SILVER ULTRA WOMENS PO)  Take by mouth.    [provider]  naproxen sodium (ALEVE) 220 MG tablet Take 440 mg by mouth 2 (two) times daily with a meal.    [provider]  Nutritional Supplements (ENSURE ORIGINAL PO) Take by mouth. 12/23/16   [provider]  oxyCODONE (OXY IR/ROXICODONE) 5 MG immediate release tablet TAKE 1 TABLET(S) 5 TIMES A DAY BY ORAL ROUTE AS NEEDED. 04/20/17   [provider]  QUEtiapine (SEROQUEL) 25 MG tablet Take 12.5 mg by mouth at bedtime.     [provider]    Physical Exam:  Vitals:   12/04/17 0945 12/04/17 0945  BP: (!) 127/116   Pulse: (!) 107 100  Resp:  19  Temp:    SpO2: 95% 95%     Alert pleasant frail cachectic bitemporal wasting  Chest clinically clear no added sound no rales no rhonchi  S1-S2 no murmur tachycardic  Abdomen soft nontender  no rebound no guarding  Internal rotation of right leg  No lower extremity edema  Skin on sacrum exam deferred given pain  No other neurological deficit other than pain limiting movement to the right hip  Psych euthymic  I have personally reviewed following labs and imaging studies  Labs:   BUN/creatinine up from 12/0.5-->19/0.5  WBC up to 19 from 5.9  Imaging studies:   Chest x-ray shows remote appearing left rib fractures without any acute fracture and COPD  Right hip x-ray comminuted angulated intertrochanteric fracture of proximal right hip with osteopenia  Medical tests:   EKG independently reviewed: Sinus tachycardia rate around 100 PR interval 0.04 rate related changes across precordium with slightly peaked T wave no ST-T wave however deviation  Test discussed with performing physician:  y   Decision to obtain old records:   y   Review and summation of old records:   y   Active Problems:   * No active hospital problems. *   Assessment/Plan Hip fracture Hepatocellular carcinoma Chronic pain Sinus tachycardia Stress demargination causing  leukocytosis likely Volume depletion Gold stage III COPD on therapy Peripheral arterial disease as above  Hip fracture-will be admitted-unclear where he will be having surgery Defer pain management, postop instructions, further management to orthopedics From my perspective is moderate risk for surgery but is necessary from the same Conflicting evidence regarding vitamin D supplementation as per recent guidelines-would defer to Dr. Dossie Der as an outpatient regarding the same  Hepatocellular carcinoma-stable and under the care of Dr. 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 Annamaria Boots to make her aware although I think she might be-I have not discussed this independently with the patient  Stress demargination likely secondary to fall and trauma-monitor  COPD Gold stage III with no oxygen requirement Still smoking 2 to 3 cigarettes a day We will replace her home inhalers which are not available here with generic [place] She may be able to use her usual meds if she can get them confirmed by pharmacy  Bipolar continue Seroquel 12.5 at bedtime, bupropion 150 daily, lorazepam 1-2 every 6 as needed anxiety  Recent rib fractures-x-ray of chest shows resolution of the same to some degree at baseline is on oxycodone 10 every 6 as needed-we will place on IV morphine for severe pain-monitor for nausea as she was nauseous secondary to Dilaudid in the ED    Severity of Illness: The appropriate patient status for this patient is INPATIENT. Inpatient status is judged to be reasonable and necessary in order to provide the required intensity of service to ensure the patient's safety. The patient's presenting symptoms, physical exam findings, and initial radiographic and laboratory data in the context of their chronic comorbidities is felt to place them at high risk for further clinical deterioration.  Furthermore, it is not anticipated that the patient will be medically stable for discharge from the hospital within 2 midnights of admission. The following factors support the patient status of inpatient.   " The patient's presenting symptoms include hip fracture. " The worrisome physical exam findings include malrotated hip. " The initial radiographic and laboratory data are worrisome because of multiple others. " The chronic co-morbidities include cancer.   * I certify that at the point of admission it is my clinical judgment that the patient will require inpatient hospital care spanning beyond 2 midnights from the point of admission due to high intensity of  service, high risk for further deterioration and high frequency of surveillance required.*     DVT prophylaxis: Lovenox Code Status: Affirms DNR at the bedside unless needs surgery Family Communication: Discussed with daughter Consults called: Orthopedics consulted  Time spent: 66 minutes  Verlon Au, MD  [facility] Direct contact: [phone] --Via amion app OR  --[email or web address]; password [email or web address]  7PM-7AM contact night coverage as above  12/04/2017, 11:22 AM

## 2017-12-04 NOTE — Plan of Care (Signed)
Pt is stable. Pain management in progress, tolerating well. No acute distress.

## 2017-12-04 NOTE — Progress Notes (Signed)
Assisted Dr. Roderic Palau with Fascia [place] block. Side rails up, monitors on throughout procedure. See vital signs in flow sheet. Tolerated Procedure well.

## 2017-12-04 NOTE — ED Notes (Signed)
Orthopedic surgery at bedside

## 2017-12-04 NOTE — ED Triage Notes (Signed)
[facility] pt comes from home for fall this morning on her way back to her room from bathroom. Pt c/o right pelvic pain. Pt already has couple back fractures that were previously.  Pt given Fentanyl 50 mcg in route via 20g in left hand.

## 2017-12-04 NOTE — ED Provider Notes (Signed)
[facility] DEPT Provider Note   CSN: [id number] Arrival date & time: 12/04/17  5597     History   Chief Complaint Chief Complaint  Patient presents with  . Fall  . Pelvic Pain    HPI Tina Bailey is a 72 y.o. female.  72 year old female with prior medical history as documented below presents with complaint of right hip pain.  Patient reports that she had a misstep and fall this morning while going to the bathroom.  After the fall she had significant pain to the right hip.  She was unable to ambulate.  She denies any other specific injury.  She did not hit her head.  She did not have loss of consciousness.  She does not have neck pain.    The history is provided by the patient.  Fall  This is a new problem. The current episode started 3 to 5 hours ago. The problem occurs rarely. The problem has not changed since onset.Pertinent negatives include no chest pain, no abdominal pain, no headaches and no shortness of breath. Nothing aggravates the symptoms. Nothing relieves the symptoms. She has tried nothing for the symptoms.    Past Medical History:  Diagnosis Date  . Arthritis    DDD. Right shoulder"is frozen"-limited ROM. osteoporosis.  . Blue toe syndrome ([place]) 11/27/2014   Dr. Claudia Pollock evaluating  . Cancer ([place]) 12/2014   liver cancer  . Complication of anesthesia   . COPD (chronic obstructive pulmonary disease) ([place])   . Fibromyalgia   . Fracture of rib of right side    hx "osteoporosis"- states her dog nudge her on the side, next day developed great pain and was told has a fracture rib.  . Macular degeneration    R eye  . Osteoporosis   . PONV (postoperative nausea and vomiting)    nausea,severe vomiting after 01-13-15 portal vein embolization  . Productive cough   . Retina disorder    L eye, vision distorted, edema  . Spine fracture due to birth trauma   . TMJ disease   . Wears glasses     Patient Active Problem List   Diagnosis Date Noted  . Chronic thoracic back pain 03/31/2017  . Physical exam 01/09/2017  . Malnutrition of moderate degree 12/22/2016  . Anxiety about health 09/27/2016  . Osteoporosis 07/13/2016  . Chronic narcotic use 07/13/2016  . Constipation 04/26/2015  . Chronic pain 04/26/2015  . Depression 04/26/2015  . COPD GOLD IV / still somking 03/03/2015  . Cigarette smoker 03/03/2015  . Cough 03/03/2015  . Hepatocellular carcinoma ([place]) 12/30/2014  . Pancreatic mass 12/04/2014  . Blue toe syndrome ([place]) 11/27/2014    Past Surgical History:  Procedure Laterality Date  . ANGIOPLASTY  2008   no stents required, no follow-up with cardiologist, no recurrent chest pain  . CATARACT EXTRACTION, BILATERAL Bilateral   . ESOPHAGOGASTRODUODENOSCOPY (EGD) WITH PROPOFOL N/A 06/25/2015   Procedure: ESOPHAGOGASTRODUODENOSCOPY (EGD) WITH PROPOFOL;  Surgeon: Milus Banister, MD;  Location: [facility];  Service: Endoscopy;  Laterality: N/A;  stent removal   . EYE SURGERY     cornea surgery  . IR RADIOLOGIST EVAL & MGMT  03/29/2017  . KIDNEY STONE SURGERY    . LAPAROSCOPIC PARTIAL HEPATECTOMY N/A 03/04/2015   Procedure: DIAGNOSTIC LAPAROSCOPY, EXTENDED RIGHT HEPATECTOMY, WITH INTRAOPERATIVE ULTRASOUND;  Surgeon: Stark Klein, MD;  Location: [facility];  Service: General;  Laterality: N/A;  . PARTIAL HYSTERECTOMY    . portal vein embolization  01-13-15 -Dr. Cathlean Sauer.     OB History    Gravida  3   Para      Term      Preterm      AB      Living  3     SAB      TAB      Ectopic      Multiple      Live Births               Home Medications    Prior to Admission medications   Medication Sig Start Date End Date Taking? Authorizing Provider  buPROPion (WELLBUTRIN XL) 150 MG 24 hr tablet Take 150 mg by mouth daily.  10/17/17  Yes [provider]  Calcium Carbonate-Vitamin D (CALCIUM 500+D HIGH POTENCY PO) Take 1 tablet by mouth daily.    Yes [provider]  Glycopyrrolate-Formoterol (BEVESPI AEROSPHERE) 9-4.8 MCG/ACT AERO Inhale 2 puffs into the lungs 2 (two) times daily. Patient taking differently: Inhale 2 puffs into the lungs daily as needed (shortness of breath).  05/30/17  Yes Tanda Rockers, MD  Carboxymethylcellul-Glycerin (CLEAR EYES FOR DRY EYES) 1-0.25 % SOLN Place 1-2 drops into both eyes daily as needed.    [provider]  Cholecalciferol (VITAMIN D3) 5000 units CAPS Take 5,000 Units by mouth daily.    [provider]  Cyanocobalamin (VITAMIN B-12) 1000 MCG SUBL Place 1,000 mcg under the tongue daily.     [provider]  ibuprofen (ADVIL,MOTRIN) 200 MG tablet Take by mouth.    [provider]  ipratropium-albuterol (DUONEB) 0.5-2.5 (3) MG/3ML SOLN Take 3 mLs by nebulization every 4 (four) hours as needed.    [provider]  LORazepam (ATIVAN) 1 MG tablet TAKE 1-2 TABLETS (1-2 MG TOTAL) BY MOUTH ONCE FOR 1 DOSE. 10/10/17   [provider]  Multiple Vitamins-Minerals (CENTRUM SILVER ULTRA WOMENS PO) Take by mouth.    [provider]  naproxen sodium (ALEVE) 220 MG tablet Take 440 mg by mouth 2 (two) times daily with a meal.    [provider]  Nutritional Supplements (ENSURE ORIGINAL PO) Take by mouth. 12/23/16   [provider]  oxyCODONE (OXY IR/ROXICODONE) 5 MG immediate release tablet TAKE 1 TABLET(S) 5 TIMES A DAY BY ORAL ROUTE AS NEEDED. 04/20/17   [provider]  QUEtiapine (SEROQUEL) 25 MG tablet Take 12.5 mg by mouth at bedtime.     [provider]    Family History Family History  Problem Relation Age of Onset  . Hypertension Mother   . Atrial fibrillation Mother   . Heart disease Mother        after age 65  . Stroke Father   . Dementia Father   . Heart disease Brother        After age 46- A-Fib  . Heart attack Brother     Social History Social History   Tobacco Use  . Smoking status: Current Every Day  Smoker    Packs/day: 0.50    Years: 45.00    Pack years: 22.50    Types: Cigarettes  . Smokeless tobacco: Never Used  Substance Use Topics  . Alcohol use: No    Alcohol/week: 0.0 standard drinks  . Drug use: No     Allergies   Sertraline hcl; Amoxicillin-pot clavulanate; Clindamycin/lincomycin; Paroxetine hcl; and Sulfa antibiotics   Review of Systems Review of Systems  Respiratory: Negative for shortness of breath.  Cardiovascular: Negative for chest pain.  Gastrointestinal: Negative for abdominal pain.  Neurological: Negative for headaches.  All other systems reviewed and are negative.    Physical Exam Updated Vital Signs BP (!) 111/59   Pulse 100   Temp (!) 97.5 F (36.4 C) (Oral)   Resp 19   SpO2 95%   Physical Exam  Constitutional: She is oriented to person, place, and time. She appears well-developed and well-nourished. No distress.  HENT:  Head: Normocephalic and atraumatic.  Mouth/Throat: Oropharynx is clear and moist.  Eyes: Pupils are equal, round, and reactive to light. Conjunctivae and EOM are normal.  Neck: Normal range of motion. Neck supple.  Cardiovascular: Normal rate, regular rhythm and normal heart sounds.  Pulmonary/Chest: Effort normal and breath sounds normal. No respiratory distress.  Abdominal: Soft. She exhibits no distension. There is no tenderness.  Musculoskeletal: She exhibits no edema or deformity.  Significant tenderness noted to the right lateral hip.  Patient's right hip is held in a flexed position for comfort.  Distal right lower externally is neurovascular intact.  Neurological: She is alert and oriented to person, place, and time.  Skin: Skin is warm and dry.  Psychiatric: She has a normal mood and affect.  Nursing note and vitals reviewed.    ED Treatments / Results  Labs (all labs ordered are listed, but only abnormal results are displayed) Labs Reviewed  BASIC METABOLIC PANEL  CBC WITH DIFFERENTIAL/PLATELET  TYPE  AND SCREEN    EKG None  Radiology Dg Ribs Unilateral W/chest Left  Result Date: 12/04/2017 CLINICAL DATA:  Hip fracture.  Fall with chest pain. EXAM: LEFT RIBS AND CHEST - 3+ VIEW COMPARISON:  01/21/2017 FINDINGS: Anterior left third, fourth, fifth, and sixth rib deformities that appear chronic and healed. No evidence of acute fracture. No hemothorax or pneumothorax. Normal heart size with aortic tortuosity stable from 2018. Hyperinflation. IMPRESSION: 1. Remote appearing left rib fractures. No evidence of acute fracture or acute cardiopulmonary disease. 2. COPD. Electronically Signed   By: Monte Fantasia M.D.   On: 12/04/2017 10:03   Dg Hip Unilat W Or Wo Pelvis 2-3 Views Right  Result Date: 12/04/2017 CLINICAL DATA:  Fall with right hip pain EXAM: DG HIP (WITH OR WITHOUT PELVIS) 2-3V RIGHT COMPARISON:  None. FINDINGS: Intertrochanteric right femur fracture with varus angulation. Both hips are located. Osteopenia. IMPRESSION: Angulated intertrochanteric right femur fracture. Electronically Signed   By: Monte Fantasia M.D.   On: 12/04/2017 10:00   Dg Femur Min 2 Views Right  Result Date: 12/04/2017 CLINICAL DATA:  Right hip and pelvis pain secondary to a fall at home today. EXAM: RIGHT FEMUR 2 VIEWS COMPARISON:  None. FINDINGS: There is a comminuted intertrochanteric fracture of the proximal right femur with slight angulation. Displacement of avulsed lesser trochanter. Diffuse osteopenia. Minimal arthritic changes of the right hip joint. The distal femur is intact. IMPRESSION: Comminuted angulated intertrochanteric fracture of the proximal right femur as described. Osteopenia. Electronically Signed   By: Lorriane Shire M.D.   On: 12/04/2017 10:01    Procedures Procedures (including critical care time)  Medications Ordered in ED Medications  morphine 4 MG/ML injection 4 mg (4 mg Intravenous Given 12/04/17 0851)  HYDROmorphone (DILAUDID) injection 0.5 mg (0.5 mg Intravenous Given 12/04/17  0944)     Initial Impression / Assessment and Plan / ED Course  I have reviewed the triage vital signs and the nursing notes.  Pertinent labs & imaging results that were available during my care of  the patient were reviewed by me and considered in my medical decision making (see chart for details).     MDM  Screen complete  Patient is presenting for right hip pain following a mechanical fall.  Screening films reveal a right hip fracture.  Dr. Lyla Glassing of orthopedics is aware of case and will evaluate fo surgical intervention.  He requests that the patient be admitted to the hospital service.  He requested the patient be n.p.o.  He is placing the patient on the OR schedule for later this afternoon.  Hospital service is aware of case and will evaluate for admission.  Final Clinical Impressions(s) / ED Diagnoses   Final diagnoses:  Closed fracture of right hip, initial encounter [facility])    ED Discharge Orders    None       Valarie Merino, MD 12/04/17 1140

## 2017-12-04 NOTE — ED Notes (Signed)
Bed: XQ82 Expected date:  Expected time:  Means of arrival:  Comments: 72 yo f fall, hip pain

## 2017-12-04 NOTE — Anesthesia Procedure Notes (Signed)
Procedure Name: MAC Date/Time: 12/04/2017 5:39 PM Performed by: Cynda Familia, CRNA Pre-anesthesia Checklist: Patient identified, Emergency Drugs available, Suction available, Patient being monitored and Timeout performed Patient Re-evaluated:Patient Re-evaluated prior to induction Oxygen Delivery Method: Simple face mask Placement Confirmation: positive ETCO2 and breath sounds checked- equal and bilateral Dental Injury: Teeth and Oropharynx as per pre-operative assessment  Comments: Sedation for spinal-- O 2 mask

## 2017-12-04 NOTE — Anesthesia Preprocedure Evaluation (Addendum)
Anesthesia Evaluation  Patient identified by MRN, date of birth, ID band Patient awake    Reviewed: Allergy & Precautions, H&P , NPO status , Patient's Chart, lab work & pertinent test results  History of Anesthesia Complications (+) PONV  Airway Mallampati: II  TM Distance: >3 FB Neck ROM: Full    Dental no notable dental hx. (+) Edentulous Upper, Partial Lower, Dental Advisory Given   Pulmonary COPD,  COPD inhaler, Current Smoker,    Pulmonary exam normal breath sounds clear to auscultation       Cardiovascular negative cardio ROS   Rhythm:Regular Rate:Normal     Neuro/Psych Anxiety Depression negative neurological ROS     GI/Hepatic negative GI ROS, Neg liver ROS,   Endo/Other  negative endocrine ROS  Renal/GU negative Renal ROS  negative genitourinary   Musculoskeletal  (+) Arthritis , Osteoarthritis,  Fibromyalgia -  Abdominal   Peds  Hematology negative hematology ROS (+)   Anesthesia Other Findings   Reproductive/Obstetrics negative OB ROS                            Anesthesia Physical Anesthesia Plan  ASA: III  Anesthesia Plan: Spinal   Post-op Pain Management:  Regional for Post-op pain   Induction: Intravenous  PONV Risk Score and Plan: 3 and Ondansetron, Propofol infusion, Treatment may vary due to age or medical condition, Dexamethasone and Midazolam  Airway Management Planned: Simple Face Mask  Additional Equipment:   Intra-op Plan:   Post-operative Plan:   Informed Consent: I have reviewed the patients History and Physical, chart, labs and discussed the procedure including the risks, benefits and alternatives for the proposed anesthesia with the patient or authorized representative who has indicated his/her understanding and acceptance.   Dental advisory given  Plan Discussed with: CRNA  Anesthesia Plan Comments:        Anesthesia Quick  Evaluation

## 2017-12-04 NOTE — Anesthesia Procedure Notes (Signed)
Anesthesia Regional Block: Fascia iliaca block   Pre-Anesthetic Checklist: ,, timeout performed, Correct Patient, Correct Site, Correct Laterality, Correct Procedure, Correct Position, site marked, Risks and benefits discussed,  Surgical consent,  Pre-op evaluation,  At surgeon's request and post-op pain management  Laterality: Right  Prep: Maximum Sterile Barrier Precautions used, chloraprep       Needles:  Injection technique: Single-shot  Needle Type: Echogenic Stimulator Needle     Needle Length: 10cm  Needle Gauge: 20     Additional Needles:   Procedures:,,,, ultrasound used (permanent image in chart),,,,  Narrative:  Start time: 12/04/2017 5:13 PM End time: 12/04/2017 5:23 PM  Performed by: Personally  Anesthesiologist: Roderic Palau, MD

## 2017-12-04 NOTE — Consult Note (Signed)
ORTHOPAEDIC CONSULTATION  REQUESTING PHYSICIAN: Nita Sells, MD  PCP:  Midge Minium, MD  Chief Complaint: Right hip pain.  HPI: Tina Bailey is a 72 y.o. female who complains of right hip pain after she tripped and fell over her dog earlier today.  She had immediate right hip pain and inability to weight-bear.  She was brought to the emergency department at [facility], where her right femur x-rays revealed an intertrochanteric comminuted fracture.  She denies other injuries.  She is currently being treated by Dr. Nelva Bush for spinal compression fractures related to osteoporosis.  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  Past Medical History:  Diagnosis Date  . Arthritis    DDD. Right shoulder"is frozen"-limited ROM. osteoporosis.  . Blue toe syndrome ([place]) 11/27/2014   Dr. Claudia Pollock evaluating  . Cancer ([place]) 12/2014   liver cancer  . Complication of anesthesia   . COPD (chronic obstructive pulmonary disease) ([place])   . Fibromyalgia   . Fracture of rib of right side    hx "osteoporosis"- states her dog nudge her on the side, next day developed great pain and was told has a fracture rib.  . Macular degeneration    R eye  . Osteoporosis   . PONV (postoperative nausea and vomiting)    nausea,severe vomiting after 01-13-15 portal vein embolization  . Productive cough   . Retina disorder    L eye, vision distorted, edema  . Spine fracture due to birth trauma   . TMJ disease   . Wears glasses    Past Surgical History:  Procedure Laterality Date  . ANGIOPLASTY  2008   no stents required, no follow-up with cardiologist, no recurrent chest pain  . CATARACT EXTRACTION, BILATERAL Bilateral   . ESOPHAGOGASTRODUODENOSCOPY (EGD) WITH PROPOFOL N/A 06/25/2015   Procedure: ESOPHAGOGASTRODUODENOSCOPY (EGD) WITH PROPOFOL;  Surgeon: Milus Banister, MD;  Location: [facility];  Service: Endoscopy;  Laterality: N/A;  stent removal   . EYE SURGERY     cornea surgery  . IR RADIOLOGIST EVAL & MGMT  03/29/2017  . KIDNEY STONE SURGERY    . LAPAROSCOPIC PARTIAL HEPATECTOMY N/A 03/04/2015   Procedure: DIAGNOSTIC LAPAROSCOPY, EXTENDED RIGHT HEPATECTOMY, WITH INTRAOPERATIVE ULTRASOUND;  Surgeon: Stark Klein, MD;  Location: [facility];  Service: General;  Laterality: N/A;  . PARTIAL HYSTERECTOMY    . portal vein embolization     01-13-15 -Dr. Cathlean Sauer.   Social History   Socioeconomic History  . Marital status: Divorced    Spouse name: Not on file  . Number of children: Not on file  . Years of education: Not on file  . Highest education level: Not on file  Occupational History  . Not on file  Social Needs  . Financial resource strain: Not on file  . Food insecurity:    Worry: Not on file    Inability: Not on file  . Transportation needs:    Medical: Not on file    Non-medical: Not on file  Tobacco Use  . Smoking status: Current Every Day Smoker    Packs/day: 0.50    Years: 45.00    Pack years: 22.50    Types: Cigarettes  . Smokeless tobacco: Never Used  Substance and Sexual Activity  . Alcohol use: No    Alcohol/week: 0.0 standard drinks  . Drug use: No  . Sexual activity: Not on file  Lifestyle  . Physical activity:    Days per week: Not on file  Minutes per session: Not on file  . Stress: Not on file  Relationships  . Social connections:    Talks on phone: Not on file    Gets together: Not on file    Attends religious service: Not on file    Active member of club or organization: Not on file    Attends meetings of clubs or organizations: Not on file    Relationship status: Not on file  Other Topics Concern  . Not on file  Social History Narrative  . Not on file   Family History  Problem Relation Age of Onset  . Hypertension Mother   . Atrial fibrillation Mother   . Heart disease Mother        after age 54  . Stroke Father   . Dementia Father   . Heart disease Brother        After age 31- A-Fib  .  Heart attack Brother    Allergies  Allergen Reactions  . Sertraline Hcl     Zoloft, had a terrible reaction  . Amoxicillin-Pot Clavulanate Nausea Only    Has patient had a PCN reaction causing immediate rash, facial/tongue/throat swelling, SOB or lightheadedness with hypotension: No Has patient had a PCN reaction causing severe rash involving mucus membranes or skin necrosis: No Has patient had a PCN reaction that required hospitalization No Has patient had a PCN reaction occurring within the last 10 years: No If all of the above answers are "NO", then may proceed with Cephalosporin use.   . Clindamycin/Lincomycin Other (See Comments)    Felt like it "burned out" her stomach/took a large dose  . Paroxetine Hcl Rash  . Sulfa Antibiotics Rash   Prior to Admission medications   Medication Sig Start Date End Date Taking? Authorizing Provider  buPROPion (WELLBUTRIN XL) 150 MG 24 hr tablet Take 150 mg by mouth daily.  10/17/17  Yes [provider]  Calcium Carbonate-Vitamin D (CALCIUM 500+D HIGH POTENCY PO) Take 1 tablet by mouth daily.    Yes [provider]  Glycopyrrolate-Formoterol (BEVESPI AEROSPHERE) 9-4.8 MCG/ACT AERO Inhale 2 puffs into the lungs 2 (two) times daily. Patient taking differently: Inhale 2 puffs into the lungs daily as needed (shortness of breath).  05/30/17  Yes Tanda Rockers, MD  Carboxymethylcellul-Glycerin (CLEAR EYES FOR DRY EYES) 1-0.25 % SOLN Place 1-2 drops into both eyes daily as needed.    [provider]  Cholecalciferol (VITAMIN D3) 5000 units CAPS Take 5,000 Units by mouth daily.    [provider]  Cyanocobalamin (VITAMIN B-12) 1000 MCG SUBL Place 1,000 mcg under the tongue daily.     [provider]  ibuprofen (ADVIL,MOTRIN) 200 MG tablet Take by mouth.    [provider]  ipratropium-albuterol (DUONEB) 0.5-2.5 (3) MG/3ML SOLN Take 3 mLs by nebulization every 4 (four) hours as needed.    [provider]  LORazepam (ATIVAN) 1 MG tablet TAKE 1-2 TABLETS (1-2 MG TOTAL) BY MOUTH ONCE FOR 1 DOSE. 10/10/17   [provider]  Multiple Vitamins-Minerals (CENTRUM SILVER ULTRA WOMENS PO) Take by mouth.    [provider]  naproxen sodium (ALEVE) 220 MG tablet Take 440 mg by mouth 2 (two) times daily with a meal.    [provider]  Nutritional Supplements (ENSURE ORIGINAL PO) Take by mouth. 12/23/16   [provider]  oxyCODONE (OXY IR/ROXICODONE) 5 MG immediate release tablet TAKE 1 TABLET(S) 5 TIMES A DAY BY ORAL ROUTE AS NEEDED. 04/20/17  [provider]  QUEtiapine (SEROQUEL) 25 MG tablet Take 12.5 mg by mouth at bedtime.     [provider]   Dg Ribs Unilateral W/chest Left  Result Date: 12/04/2017 CLINICAL DATA:  Hip fracture.  Fall with chest pain. EXAM: LEFT RIBS AND CHEST - 3+ VIEW COMPARISON:  01/21/2017 FINDINGS: Anterior left third, fourth, fifth, and sixth rib deformities that appear chronic and healed. No evidence of acute fracture. No hemothorax or pneumothorax. Normal heart size with aortic tortuosity stable from 2018. Hyperinflation. IMPRESSION: 1. Remote appearing left rib fractures. No evidence of acute fracture or acute cardiopulmonary disease. 2. COPD. Electronically Signed   By: Monte Fantasia M.D.   On: 12/04/2017 10:03   Dg Hip Unilat W Or Wo Pelvis 2-3 Views Right  Result Date: 12/04/2017 CLINICAL DATA:  Fall with right hip pain EXAM: DG HIP (WITH OR WITHOUT PELVIS) 2-3V RIGHT COMPARISON:  None. FINDINGS: Intertrochanteric right femur fracture with varus angulation. Both hips are located. Osteopenia. IMPRESSION: Angulated intertrochanteric right femur fracture. Electronically Signed   By: Monte Fantasia M.D.   On: 12/04/2017 10:00   Dg Femur Min 2 Views Right  Result Date: 12/04/2017 CLINICAL DATA:  Right hip and pelvis pain secondary to a fall at home today. EXAM: RIGHT FEMUR 2 VIEWS COMPARISON:  None. FINDINGS:  There is a comminuted intertrochanteric fracture of the proximal right femur with slight angulation. Displacement of avulsed lesser trochanter. Diffuse osteopenia. Minimal arthritic changes of the right hip joint. The distal femur is intact. IMPRESSION: Comminuted angulated intertrochanteric fracture of the proximal right femur as described. Osteopenia. Electronically Signed   By: Lorriane Shire M.D.   On: 12/04/2017 10:01    Positive ROS: All other systems have been reviewed and were otherwise negative with the exception of those mentioned in the HPI and as above.  Physical Exam: General: Alert, no acute distress Cardiovascular: No pedal edema Respiratory: No cyanosis, no use of accessory musculature GI: No organomegaly, abdomen is soft and non-tender Skin: No lesions in the area of chief complaint Neurologic: Sensation intact distally Psychiatric: Patient is competent for consent with normal mood and affect Lymphatic: No axillary or cervical lymphadenopathy  MUSCULOSKELETAL: Examination of the right lower extremity reveals no skin wounds or lesions.  She does have shortening and rotation.  She has positive motor function dorsiflexion, plantarflexion, and great toe extension. she reports intact sensation.  Her foot is warm and well-perfused.  Assessment: Closed, comminuted right intertrochanteric femur fracture. Osteoporosis.  Plan: I discussed the findings with the patient and her daughter.  Her fracture requires surgical stabilization.  We discussed the risk, benefits, and alternatives to intramedullary fixation of her right intertrochanteric femur fracture.  Please see statement of risk.  We will plan for surgery later today.  N.p.o.  Hold chemical DVT prophylaxis.  Do not start osteoporosis medications within the next 6 months postoperatively.  She will be seen by the hospitalist for admission as well as perioperative risk stratification and medical optimization.  We discussed discharge to  home with home health physical therapy.  All questions were solicited and answered.    Bertram Savin, MD Cell [phone]    12/04/2017 1:09 PM

## 2017-12-04 NOTE — ED Notes (Signed)
Hospitalist at bedside 

## 2017-12-04 NOTE — Discharge Instructions (Signed)
 Dr. Jahrell Hamor Adult Hip & Knee Specialist [facility] [address] , [place] [address] [phone]   POSTOPERATIVE DIRECTIONS    Hip Rehabilitation, Guidelines Following Surgery   WEIGHT BEARING Weight bearing as tolerated with assist device (walker, cane, etc) as directed, use it as long as suggested by your surgeon or therapist, typically at least 4-6 weeks.   HOME CARE INSTRUCTIONS  Remove items at home which could result in a fall. This includes throw rugs or furniture in walking pathways.  Continue medications as instructed at time of discharge.  You may have some home medications which will be placed on hold until you complete the course of blood thinner medication.  4 days after discharge, you may start showering. No tub baths or soaking your incisions. Do not put on socks or shoes without following the instructions of your caregivers.   Sit on chairs with arms. Use the chair arms to help push yourself up when arising.  Arrange for the use of a toilet seat elevator so you are not sitting low.   Walk with walker as instructed.  You may resume a sexual relationship in one month or when given the OK by your caregiver.  Use walker as long as suggested by your caregivers.  Avoid periods of inactivity such as sitting longer than an hour when not asleep. This helps prevent blood clots.  You may return to work once you are cleared by your surgeon.  Do not drive a car for 6 weeks or until released by your surgeon.  Do not drive while taking narcotics.  Wear elastic stockings for two weeks following surgery during the day but you may remove then at night.  Make sure you keep all of your appointments after your operation with all of your doctors and caregivers. You should call the office at the above phone number and make an appointment for approximately two weeks after the date of your surgery. Please pick up a stool softener and laxative  for home use as long as you are requiring pain medications.  ICE to the affected hip every three hours for 30 minutes at a time and then as needed for pain and swelling. Continue to use ice on the hip for pain and swelling from surgery. You may notice swelling that will progress down to the foot and ankle.  This is normal after surgery.  Elevate the leg when you are not up walking on it.   It is important for you to complete the blood thinner medication as prescribed by your doctor.  Continue to use the breathing machine which will help keep your temperature down.  It is common for your temperature to cycle up and down following surgery, especially at night when you are not up moving around and exerting yourself.  The breathing machine keeps your lungs expanded and your temperature down.  RANGE OF MOTION AND STRENGTHENING EXERCISES  These exercises are designed to help you keep full movement of your hip joint. Follow your caregiver's or physical therapist's instructions. Perform all exercises about fifteen times, three times per day or as directed. Exercise both hips, even if you have had only one joint replacement. These exercises can be done on a training (exercise) mat, on the floor, on a table or on a bed. Use whatever works the best and is most comfortable for you. Use music or television while you are exercising so that the exercises are a pleasant break in your day. This   will make your life better with the exercises acting as a break in routine you can look forward to.  Lying on your back, slowly slide your foot toward your buttocks, raising your knee up off the floor. Then slowly slide your foot back down until your leg is straight again.  Lying on your back spread your legs as far apart as you can without causing discomfort.  Lying on your side, raise your upper leg and foot straight up from the floor as far as is comfortable. Slowly lower the leg and repeat.  Lying on your back, tighten up the  muscle in the front of your thigh (quadriceps muscles). You can do this by keeping your leg straight and trying to raise your heel off the floor. This helps strengthen the largest muscle supporting your knee.  Lying on your back, tighten up the muscles of your buttocks both with the legs straight and with the knee bent at a comfortable angle while keeping your heel on the floor.   SKILLED REHAB INSTRUCTIONS: If the patient is transferred to a skilled rehab facility following release from the hospital, a list of the current medications will be sent to the facility for the patient to continue.  When discharged from the skilled rehab facility, please have the facility set up the patient's Home Health Physical Therapy prior to being released. Also, the skilled facility will be responsible for providing the patient with their medications at time of release from the facility to include their pain medication and their blood thinner medication. If the patient is still at the rehab facility at time of the two week follow up appointment, the skilled rehab facility will also need to assist the patient in arranging follow up appointment in our office and any transportation needs.  MAKE SURE YOU:  Understand these instructions.  Will watch your condition.  Will get help right away if you are not doing well or get worse.  Pick up stool softner and laxative for home use following surgery while on pain medications. Daily dry dressing changes as needed. In 4 days, you may remove your dressings and begin taking showers - no tub baths or soaking the incisions. Continue to use ice for pain and swelling after surgery. Do not use any lotions or creams on the incision until instructed by your surgeon.   

## 2017-12-04 NOTE — ED Notes (Signed)
Blood draw:  Light green, purple top, blue top, 3 golds and pink top sent to main lab.

## 2017-12-04 NOTE — ED Notes (Signed)
Tina Muscat [id number]. Please call before sent to surgery so they know where the waiting room is.  Still on premises.

## 2017-12-04 NOTE — Progress Notes (Signed)
PHARMACY NOTE:  ANTIMICROBIAL RENAL DOSAGE ADJUSTMENT  Current antimicrobial regimen includes a mismatch between antimicrobial dosage and estimated renal function.  As per policy approved by the Pharmacy & Therapeutics and Medical Executive Committees, the antimicrobial dosage will be adjusted accordingly.  Current antimicrobial dosage: Cefazolin 2g IV q6h x 2 doses post-operatively   Indication: surgical prophylaxis   Renal Function:  Estimated Creatinine Clearance: 41.8 mL/min (by C-G formula based on SCr of 0.57 mg/dL). []      On intermittent HD, scheduled: []      On CRRT    Antimicrobial dosage has been changed to: Cefazolin 2g IV q8h x 2 doses post-operatively    Thank you for allowing pharmacy to be a part of this patient's care.  Luiz Ochoa, [facility] 12/04/2017 9:18 PM

## 2017-12-04 NOTE — ED Notes (Signed)
Patient transported to X-ray 

## 2017-12-04 NOTE — ED Notes (Signed)
O2 level was at 78%. Pt continues to remove nasal canula.  Asked pt to keep O2 nasal on.   Informed RN.

## 2017-12-04 NOTE — Anesthesia Procedure Notes (Signed)
Spinal  Patient location during procedure: OR Start time: 12/04/2017 5:38 PM End time: 12/04/2017 5:45 PM Staffing Anesthesiologist: Roderic Palau, MD Performed: anesthesiologist  Preanesthetic Checklist Completed: patient identified, surgical consent, pre-op evaluation, timeout performed, IV checked, risks and benefits discussed and monitors and equipment checked Spinal Block Patient position: left lateral decubitus Prep: DuraPrep Patient monitoring: cardiac monitor, continuous pulse ox and blood pressure Approach: midline Location: L3-4 Injection technique: single-shot Needle Needle type: Pencan  Needle gauge: 24 G Needle length: 9 cm Assessment Sensory level: T8 Additional Notes Functioning IV was confirmed and monitors were applied. Sterile prep and drape, including hand hygiene and sterile gloves were used. The patient was positioned and the spine was prepped. The skin was anesthetized with lidocaine.  Free flow of clear CSF was obtained prior to injecting local anesthetic into the CSF.  The spinal needle aspirated freely following injection.  The needle was carefully withdrawn.  The patient tolerated the procedure well.

## 2017-12-04 NOTE — Transfer of Care (Signed)
Immediate Anesthesia Transfer of Care Note  Patient: Tina Bailey  Procedure(s) Performed: INTRAMEDULLARY (IM) NAIL INTERTROCHANTRIC (Right Leg Upper)  Patient Location: PACU  Anesthesia Type:Spinal  Level of Consciousness: awake, alert  and patient cooperative  Airway & Oxygen Therapy: Patient Spontanous Breathing and Patient connected to face mask oxygen  Post-op Assessment: Report given to RN and Post -op Vital signs reviewed and stable  Post vital signs: stable  Last Vitals:  Vitals Value Taken Time  BP 120/70 12/04/2017  7:15 PM  Temp    Pulse 44 12/04/2017  7:15 PM  Resp 18 12/04/2017  7:21 PM  SpO2 75 % 12/04/2017  7:15 PM  Vitals shown include unvalidated device data.  Last Pain:  Vitals:   12/04/17 1557  TempSrc:   PainSc: 9          Complications: No apparent anesthesia complications

## 2017-12-04 NOTE — Op Note (Signed)
OPERATIVE REPORT  SURGEON: Rod Can, MD   ASSISTANT: Theresa Duty, PA-C.  PREOPERATIVE DIAGNOSIS: Right intertrochanteric femur fracture.   POSTOPERATIVE DIAGNOSIS: Right intertrochanteric femur fracture.   PROCEDURE: Intramedullary fixation, Right femur.   IMPLANTS: Biomet Affixus Hip Fracture Nail, 11 by 180 mm, 130 degrees. 10.5 x 105 mm Hip Fracture Nail Lag Screw. 5 x 32 mm distal interlocking screw 1.  ANESTHESIA:  Spinal  ESTIMATED BLOOD LOSS:-50 mL    ANTIBIOTICS: 2 g Ancef.  DRAINS: None.  COMPLICATIONS: None.   CONDITION: PACU - hemodynamically stable.Marland Kitchen   BRIEF CLINICAL NOTE: Tina Bailey is a 72 y.o. female who presented with an intertrochanteric femur fracture. The patient was admitted to the hospitalist service and underwent perioperative risk stratification and medical optimization. The risks, benefits, and alternatives to the procedure were explained, and the patient elected to proceed.  PROCEDURE IN DETAIL: Surgical site was marked by myself. The patient was taken to the operating room and anesthesia was induced on the bed. The patient was then transferred to the [facility] table and the nonoperative lower extremity was scissored underneath the operative side. The fracture was reduced with traction, internal rotation, and adduction. The hip was prepped and draped in the normal sterile surgical fashion. Timeout was called verifying side and site of surgery. Preop antibiotics were given with 60 minutes of beginning the procedure.  Fluoroscopy was used to define the patient's anatomy. A 4 cm incision was made just proximal to the tip of the greater trochanter. The awl was used to obtain the standard starting point for a trochanteric entry nail under fluoroscopic control. The guidepin was placed. The entry reamer was used to open the proximal femur.  On the back table, the nail was assembled onto the jig. The nail was placed into the femur without any  difficulty. Through a separate stab incision, the cannula was placed down to the bone in preparation for the cephalomedullary device. A guidepin was placed into the femoral head using AP and lateral fluoroscopy views. The pin was measured, and then reaming was performed to the appropriate depth. The lag screw was inserted to the appropriate depth. The fracture was compressed through the jig. The setscrew was tightened and then loosened one quarter turn. A separate stab incision was created, and the distal interlocking screw was placed using standard AO technique. The jig was removed. Final AP and lateral fluoroscopy views were obtained to confirm fracture reduction and hardware placement. Tip apex distance was appropriate. There was no chondral penetration.  The wounds were copiously irrigated with saline. The wound was closed in layers with #1 Vicryl for the fascia, 2-0 Monocryl for the deep dermal layer, and 3-0 Monocryl subcuticular stitch. Glue was applied to the skin. Once the glue was fully hardened, sterile dressing was applied. The patient was then awakened from anesthesia and taken to the PACU in stable condition. Sponge needle and instrument counts were correct at the end of the case 2. There were no known complications.  We will readmit the patient to the hospitalist. Weightbearing status will be weightbearing as tolerated with a walker. We will begin Lovenox for DVT prophylaxis. The patient will work with physical therapy and undergo disposition planning.  Please note that a surgical assistant was a medical necessity for this procedure to perform it in a safe and expeditious manner. Assistant was necessary to provide appropriate retraction of vital neurovascular structures, to prevent femoral fracture, and to allow for anatomic placement of the prosthesis.

## 2017-12-05 ENCOUNTER — Encounter (HOSPITAL_COMMUNITY): Payer: Self-pay | Admitting: Orthopedic Surgery

## 2017-12-05 DIAGNOSIS — E43 Unspecified severe protein-calorie malnutrition: Secondary | ICD-10-CM

## 2017-12-05 LAB — CBC
HCT: 31.2 % — ABNORMAL LOW (ref 36.0–46.0)
Hemoglobin: 10.1 g/dL — ABNORMAL LOW (ref 12.0–15.0)
MCH: 30.7 pg (ref 26.0–34.0)
MCHC: 32.4 g/dL (ref 30.0–36.0)
MCV: 94.8 fL (ref 78.0–100.0)
Platelets: 123 10*3/uL — ABNORMAL LOW (ref 150–400)
RBC: 3.29 MIL/uL — ABNORMAL LOW (ref 3.87–5.11)
RDW: 13.1 % (ref 11.5–15.5)
WBC: 7.6 10*3/uL (ref 4.0–10.5)

## 2017-12-05 LAB — BASIC METABOLIC PANEL
Anion gap: 8 (ref 5–15)
BUN: 15 mg/dL (ref 8–23)
CO2: 30 mmol/L (ref 22–32)
Calcium: 8.6 mg/dL — ABNORMAL LOW (ref 8.9–10.3)
Chloride: 98 mmol/L (ref 98–111)
Creatinine, Ser: 0.5 mg/dL (ref 0.44–1.00)
GFR calc Af Amer: >60 mL/min (ref >60)
GFR calc non Af Amer: >60 mL/min (ref >60)
Glucose, Bld: 131 mg/dL — ABNORMAL HIGH (ref 70–99)
Potassium: 4.7 mmol/L (ref 3.5–5.1)
Sodium: 136 mmol/L (ref 135–145)

## 2017-12-05 MED ORDER — OXYCODONE HCL 10 MG PO TABS: 10.0000 mg | ORAL_TABLET | Freq: Four times a day (QID) | ORAL | 0 refills | Status: DC | PRN

## 2017-12-05 MED ORDER — ENOXAPARIN SODIUM 30 MG/0.3ML ~~LOC~~ SOLN: 30.0000 mg | SUBCUTANEOUS | 0 refills | Status: DC

## 2017-12-05 MED ORDER — ENSURE ENLIVE PO LIQD
237.0000 mL | Freq: Two times a day (BID) | ORAL | Status: DC
  Administered 2017-12-06 (×2): 237 mL via ORAL

## 2017-12-05 NOTE — Evaluation (Signed)
Physical Therapy Evaluation Patient Details Name: Tina Bailey MRN: [id number] DOB: 11-28-1945 Today's Date: 12/05/2017   History of Present Illness  Pt was admitted for R comminuted intertrochanteric fx of R proximal femur:  s/p IM Nail.  PMH:  recent T 5-6 compression fxs, HCC, COPD and macular degeneration  Clinical Impression  The patient is anxious at times, requires 2 assist for safety with transfers. Unable to ambulate today.  Patient will benefit from post acute PT> daughter reports plans to have someone in while family works.If not 24/7 caregivers, SNF will benefit patient. Pt admitted with above diagnosis. Pt currently with functional limitations due to the deficits listed below (see PT Problem List). Pt will benefit from skilled PT to increase their independence and safety with mobility to allow discharge to the venue listed below.       Follow Up Recommendations Home health PT; vsSNF-depending on caregivers.    Equipment Recommendations  Rolling walker with 5" wheels    Recommendations for Other Services       Precautions / Restrictions Precautions Precautions: Fall Restrictions Weight Bearing Restrictions: No Other Position/Activity Restrictions: wbat      Mobility  Bed Mobility Overal bed mobility: Needs Assistance Bed Mobility: Sidelying to Sit   Sidelying to sit: Min assist;+2 for safety/equipment       General bed mobility comments: guarded and controlled descent of R LE to floor; light assistance for trunk  Transfers Overall transfer level: Needs assistance Equipment used: Rolling walker (2 wheeled) Transfers: Stand Pivot Transfers Sit to Stand: Min assist;Mod assist;+2 physical assistance;From elevated surface         General transfer comment: cues and assistance for RLE placement; cues for hand placement and assist to rise and steady, cues  And assist for right leg position to stand and to sit, decreased weight bearing tolerated on right leg,  buckling noted, Assist to transfer to [facility]. Recliner brought up to the patient and assisted to sitting by supporting right leg.  Ambulation/Gait                Stairs            Wheelchair Mobility    Modified Rankin (Stroke Patients Only)       Balance Overall balance assessment: Needs assistance;History of Falls Sitting-balance support: Feet supported;Bilateral upper extremity supported Sitting balance-Leahy Scale: Fair Sitting balance - Comments: guards self, supporting with bilteral arms stating trunk is painful                                     Pertinent Vitals/Pain Pain Assessment: Faces Faces Pain Scale: Hurts even more Pain Location: R hip/thigh Pain Descriptors / Indicators: Aching;Jabbing Pain Intervention(s): Monitored during session;Premedicated before session;Patient requesting pain meds-RN notified;Ice applied;Repositioned    Home Living Family/patient expects to be discharged to:: Private residence Living Arrangements: Children Available Help at Discharge: Family;Available PRN/intermittently Type of Home: House Home Access: Stairs to enter   Entrance Stairs-Number of Steps: 2 Home Layout: One level Home Equipment: Walker - 4 wheels;Tub bench      Prior Function Level of Independence: Independent               Hand Dominance   Dominant Hand: Right    Extremity/Trunk Assessment   Upper Extremity Assessment Upper Extremity Assessment: Defer to OT evaluation    Lower Extremity Assessment Lower Extremity Assessment: RLE deficits/detail RLE Deficits / Details:  decreased control to extend knee and to weight bear    Cervical / Trunk Assessment Cervical / Trunk Assessment: Kyphotic  Communication   Communication: No difficulties  Cognition Arousal/Alertness: Awake/alert Behavior During Therapy: WFL for tasks assessed/performed;Anxious Overall Cognitive Status: Within Functional Limits for tasks assessed                                         General Comments      Exercises     Assessment/Plan    PT Assessment Patient needs continued PT services  PT Problem List Decreased strength;Decreased range of motion;Decreased activity tolerance;Decreased balance;Decreased mobility;Pain;Decreased knowledge of precautions;Decreased safety awareness;Decreased knowledge of use of DME       PT Treatment Interventions DME instruction;Gait training;Stair training;Functional mobility training;Therapeutic activities;Patient/family education    PT Goals (Current goals can be found in the Care Plan section)  Acute Rehab PT Goals Patient Stated Goal: return to independence PT Goal Formulation: With patient/family Time For Goal Achievement: 12/19/17 Potential to Achieve Goals: Fair    Frequency Min 6X/week   Barriers to discharge Decreased caregiver support      Co-evaluation PT/OT/SLP Co-Evaluation/Treatment: Yes Reason for Co-Treatment: For patient/therapist safety PT goals addressed during session: Mobility/safety with mobility OT goals addressed during session: ADL's and self-care       AM-PAC PT "6 Clicks" Daily Activity  Outcome Measure Difficulty turning over in bed (including adjusting bedclothes, sheets and blankets)?: Unable Difficulty moving from lying on back to sitting on the side of the bed? : Unable Difficulty sitting down on and standing up from a chair with arms (e.g., wheelchair, bedside commode, etc,.)?: Unable Help needed moving to and from a bed to chair (including a wheelchair)?: Total Help needed walking in hospital room?: Total Help needed climbing 3-5 steps with a railing? : Total 6 Click Score: 6    End of Session Equipment Utilized During Treatment: Gait belt Activity Tolerance: Patient limited by pain Patient left: in chair;with call bell/phone within reach;with family/visitor present;with chair alarm set Nurse Communication: Mobility status PT Visit  Diagnosis: Unsteadiness on feet (R26.81);History of falling (Z91.81)    Time: 0093-8182 PT Time Calculation (min) (ACUTE ONLY): 32 min   Charges:   PT Evaluation $PT Eval Low Complexity: [facility] Acute Rehabilitation Services Pager [phone] Office [phone]   Claretha Cooper 12/05/2017, 1:00 PM

## 2017-12-05 NOTE — Progress Notes (Signed)
Initial Nutrition Assessment  DOCUMENTATION CODES:   Severe malnutrition in context of chronic illness, Underweight  INTERVENTION:   Provide Ensure Enlive po BID, each supplement provides 350 kcal and 20 grams of protein  NUTRITION DIAGNOSIS:   Severe Malnutrition related to chronic illness(osteoporosis) as evidenced by energy intake < or equal to 75% for > or equal to 1 month, severe fat depletion, severe muscle depletion.  GOAL:   Patient will meet greater than or equal to 90% of their needs  MONITOR:   PO intake, Supplement acceptance, Labs, Weight trends, Skin, I & O's  REASON FOR ASSESSMENT:   (Low BMI)    ASSESSMENT:   72 y.o. female who complains of right hip pain after she tripped and fell over her dog earlier today.  She had immediate right hip pain and inability to weight-bear.  She was brought to the emergency department at [facility], where her right femur x-rays revealed an intertrochanteric comminuted fracture.  9/16: s/p Intramedullary fixation, Right femur.   Patient reports fluctuating appetite affected by being on chronic pain medications. Pt states she tries to eat when she is hungry but she struggles to eat 3 meals a day. States she drinks 1 Ensure daily at home.  Pt states she ate a good breakfast this morning, had eggs, oatmeal, bacon and coffee. RD to order vanilla Ensure for patient.  Per patient, UBW has been 91-93 lb at her recent doctor's visits. Per weight records, pt has lost 11 lb since September 2018 (11% wt loss x 1 year, insignificant for time frame).  Encouraged patient to try and drink at least 2 Ensures daily along with maximizing calories in the foods she does eat. Pt states she tried to cook her foods in olive oil.  Labs reviewed. Medications: OSCAL w/ D tablet BID, Vitamin D tablet daily, Colace capsule BID, Vitamin B-12 tablet daily  NUTRITION - FOCUSED PHYSICAL EXAM:    Most Recent Value  Orbital Region  No depletion   Upper Arm Region  Severe depletion  Thoracic and Lumbar Region  Unable to assess  Buccal Region  Mild depletion  Temple Region  Moderate depletion  Clavicle Bone Region  Severe depletion  Clavicle and Acromion Bone Region  Severe depletion  Scapular Bone Region  Unable to assess  Dorsal Hand  Severe depletion  Patellar Region  Unable to assess  Anterior Thigh Region  Unable to assess  Posterior Calf Region  Unable to assess  Edema (RD Assessment)  None       Diet Order:   Diet Order            Diet regular Room service appropriate? Yes; Fluid consistency: Thin  Diet effective now              EDUCATION NEEDS:   Education needs have been addressed  Skin:  Skin Assessment: Reviewed RN Assessment  Last BM:  9/15  Height:   Ht Readings from Last 1 Encounters:  12/04/17 5\' 3"  (1.6 m)    Weight:   Wt Readings from Last 1 Encounters:  12/04/17 41.7 kg    Ideal Body Weight:  52.3 kg  BMI:  Body mass index is 16.3 kg/m.  Estimated Nutritional Needs:   Kcal:  1300-1500  Protein:  60-70g  Fluid:  1.5L/day   Clayton Bibles, MS, RD, LDN [place] Dietitian Pager: [phone] After Hours Pager: [phone]

## 2017-12-05 NOTE — Evaluation (Signed)
Occupational Therapy Evaluation Patient Details Name: Tina Bailey MRN: [id number] DOB: 07/13/1945 Today's Date: 12/05/2017    History of Present Illness Pt was admitted for R comminuted intertrochanteric fx of R proximal femur:  s/p IM Nail.  PMH:  recent T 5-6 compression fxs, HCC, COPD and macular degeneration   Clinical Impression   This 72 year old female was admitted for the above sx.  She was independent with adls at home, although some thins were difficult as she has T-spine fxs.  She will benefit from continued OT in acute setting to increase safety and independence with adls.  Daughter will assist her as needed. Goals are for min A to min guard in acute setting    Follow Up Recommendations  Supervision/Assistance - 24 hour;Home health OT(vs snf depending on progress)    Equipment Recommendations  3 in 1 bedside commode    Recommendations for Other Services       Precautions / Restrictions Precautions Precautions: Fall Restrictions Weight Bearing Restrictions: No Other Position/Activity Restrictions: wbat      Mobility Bed Mobility Overal bed mobility: Needs Assistance Bed Mobility: Sidelying to Sit   Sidelying to sit: Min assist;+2 for safety/equipment       General bed mobility comments: guarded and controlled descent of R LE to floor; light assistance for trunk  Transfers Overall transfer level: Needs assistance Equipment used: Rolling walker (2 wheeled) Transfers: Sit to/from Stand Sit to Stand: Min assist;Mod assist;+2 physical assistance;From elevated surface         General transfer comment: cues and assistance for RLE placement; cues for hand placement and assist to rise and steady    Balance                                           ADL either performed or assessed with clinical judgement   ADL Overall ADL's : Needs assistance/impaired Eating/Feeding: Set up   Grooming: Set up;Sitting   Upper Body Bathing: Set  up;Sitting   Lower Body Bathing: Maximal assistance;+2 for physical assistance;Bed level   Upper Body Dressing : Minimal assistance;Sitting   Lower Body Dressing: Total assistance;+2 for physical assistance;Sit to/from stand   Toilet Transfer: Moderate assistance;+2 for physical assistance;Stand-pivot;BSC;RW   Toileting- Clothing Manipulation and Hygiene: Minimal assistance;Sitting/lateral lean         General ADL Comments: performed SPT to 3:1 commode then got up to chair. Daughter came near end of session     Vision Baseline Vision/History: Macular Degeneration       Perception     Praxis      Pertinent Vitals/Pain Pain Assessment: Faces Faces Pain Scale: Hurts even more Pain Location: R hip/thigh Pain Descriptors / Indicators: Aching Pain Intervention(s): Limited activity within patient's tolerance;Monitored during session;Premedicated before session;Repositioned;Patient requesting pain meds-RN notified;Ice applied     Hand Dominance Right   Extremity/Trunk Assessment Upper Extremity Assessment Upper Extremity Assessment: Generalized weakness(limited movement due to compression fxs)           Communication Communication Communication: No difficulties   Cognition Arousal/Alertness: Awake/alert Behavior During Therapy: WFL for tasks assessed/performed Overall Cognitive Status: Within Functional Limits for tasks assessed                                     General Comments  Exercises     Shoulder Instructions      Home Living Family/patient expects to be discharged to:: Private residence Living Arrangements: Children Available Help at Discharge: Family;Available PRN/intermittently Type of Home: House Home Access: Stairs to enter [employer] of Steps: 2   Home Layout: One level     Bathroom Shower/Tub: Teacher, early years/pre: Standard     Home Equipment: Environmental consultant - 4 wheels;Tub bench           Prior Functioning/Environment Level of Independence: Independent                 OT Problem List: Decreased strength;Decreased activity tolerance;Decreased knowledge of use of DME or AE;Pain;Impaired UE functional use      OT Treatment/Interventions: Self-care/ADL training;DME and/or AE instruction;Balance training;Patient/family education;Therapeutic activities    OT Goals(Current goals can be found in the care plan section) Acute Rehab OT Goals Patient Stated Goal: return to independence OT Goal Formulation: With patient Time For Goal Achievement: 12/19/17 Potential to Achieve Goals: Good ADL Goals Pt Will Perform Grooming: with min guard assist;standing Pt Will Transfer to Toilet: with min assist;ambulating;bedside commode Pt Will Perform Toileting - Clothing Manipulation and hygiene: with min guard assist;sit to/from stand Additional ADL Goal #1: pt will demonstrate vs verbalize use of AE for LB adls  OT Frequency: Min 2X/week   Barriers to D/C:            Co-evaluation PT/OT/SLP Co-Evaluation/Treatment: Yes Reason for Co-Treatment: For patient/therapist safety PT goals addressed during session: Mobility/safety with mobility OT goals addressed during session: ADL's and self-care      AM-PAC PT "6 Clicks" Daily Activity     Outcome Measure Help from another person eating meals?: A Little Help from another person taking care of personal grooming?: A Little Help from another person toileting, which includes using toliet, bedpan, or urinal?: A Lot Help from another person bathing (including washing, rinsing, drying)?: A Lot Help from another person to put on and taking off regular upper body clothing?: A Little Help from another person to put on and taking off regular lower body clothing?: Total 6 Click Score: 14   End of Session Nurse Communication: Mobility status  Activity Tolerance: Patient tolerated treatment well Patient left: in bed;with call bell/phone  within reach;with chair alarm set;with family/visitor present  OT Visit Diagnosis: Pain Pain - Right/Left: Right Pain - part of body: Hip;Leg                Time: 1610-9604 OT Time Calculation (min): 32 min Charges:  OT General Charges $OT Visit: 1 Visit OT Evaluation $OT Eval Low Complexity: [place], OTR/L Acute Rehabilitation Services [phone] [facility] pager [phone] office 12/05/2017  Delina Kruczek 12/05/2017, 12:25 PM

## 2017-12-05 NOTE — Progress Notes (Signed)
PROGRESS NOTE    Tina Bailey  FAO:[id number] DOB: [birth year] DOA: 12/04/2017 PCP: Midge Minium, MD   Brief Narrative:  HPI Per Dr. Princella Ion on 12/04/17 71 f recently diagnosed liver mass follows with Dr. Annamaria Boots please see her most recent progress note 10/10/2017--liver right lobe hepatectomy pain poorly differentiated HCC 8.5 cm Chronic thoracic back pain followed by Dr. Herma Mering pain management Cancer related cachexia COPD Gold stage III follows with Dr. Melvyn Novas still smoker Peripheral arterial disease last ABIs 07/10/2017-right side 0.6, left side 0.78 Last hospital admission 12/22/2016 for acute respiratory failure with hypoxia   Walking at home and tripped over her dog in the dark-try to keep balance-fell and could not move-had severe pain and was unable to get up-accidental fall without any prodrome-no recent fever chills or any illnesses to speak of-usually supposed to be using a walker outside of the house does not use it within the home-has been using a walker since January 2019  2 weeks prior had thoracic fractures T5-T6 followed by Dr. Herma Mering and given oxycodone for the same  **Orthopedic surgery Dr. Lyla Glassing evaluated patient went to surgery last night and she is postoperative day 1 and doing well.  Assessment & Plan:   Active Problems:   Closed comminuted intertrochanteric fracture of proximal end of right femur (HCC)   Protein-calorie malnutrition, severe  Closed comminuted intro trochanteric fracture of the proximal end of the right femur status post intramedullary fixation postop day 1 -Managed per orthopedic surgery -Continue weightbearing as tolerated with a walker -PT OT to evaluate and treat -DVT Proflex per general surgeon -Pain control per surgery -Orthopedic surgery recommending DC home with home health PT likely tomorrow  Hepatocellular carcinoma -Stable and under the care of Dr. 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 Verlon Au forwarded to Dr. Burr Medico to make her aware although I think she might be-have not discussed this independently with the patient  Leukocytosis -Stress demargination likely secondary to fall and trauma -Improved -Continue to monitor for signs and symptoms of infection -Repeat CBC in a.m.  Microcytic anemia -Expected postoperative drop -Hemoglobin/hematocrit went from 40.4/43.8 and is now 10.1/31.2 -Continue monitor for signs and symptoms of bleeding -Repeat CMP in a.m.  Thrombocytopenia -Patient with count of 211 and is now 23 -Continue monitor for signs and symptoms of bleeding -If continues to drop we will need to hold Lovenox and monitor -Repeat CBC in AM   COPD Gold stage III with no oxygen requirement -Still smoking 2 to 3 cigarettes a day -We will replace her home inhalers which are not available here with generic Sahar -She may be able to use her usual meds if she can get them confirmed by pharmacy  Bipolar  -continue Seroquel 12.5 at bedtime, bupropion 150 daily, lorazepam 1-2 every 6 as needed anxiety  Recent rib fractures -x-ray of chest shows resolution of the same to some degree at baseline is on oxycodone 10 every 6 as needed-we will place on IV morphine for severe pain-monitor for nausea as she was nauseous secondary to Dilaudid in the ED -Continue Pain Control  Severe protein malnutrition -Nutrition is consulted for further evaluation recommendations -Continue with Ensure Enlive p.o. twice daily  DVT prophylaxis: Lovenox, SCDs, TED hose Code Status: Full code Family Communication: No family present at bedside Disposition Plan: Anticipate discharge home with home health the next 24 to 48 hours with home health PT OT versus SNF depending  on caregiver situation  Consultants:   Orthopedics   Procedures:  Right Femur intramedullary  fixation   Antimicrobials:  Anti-infectives (From admission, onward)   Start     Dose/Rate Route Frequency Ordered Stop   12/05/17 0200  ceFAZolin (ANCEF) IVPB 2g/100 mL premix     2 g 200 mL/hr over 30 Minutes Intravenous Every 8 hours 12/04/17 2033 12/05/17 0943   12/04/17 1430  ceFAZolin (ANCEF) IVPB 2g/100 mL premix     2 g 200 mL/hr over 30 Minutes Intravenous On call to O.R. 12/04/17 1413 12/04/17 1746     Subjective: Patient was seen and examined at bedside and states that she was doing fairly well after surgery.  States pain is controlled with the pain medications received.  No chest pain, lightheadedness or dizziness.  No other concerns or concerns at this time and understands that she is work with physical therapy today  Objective: Vitals:   12/05/17 0127 12/05/17 0534 12/05/17 0934 12/05/17 1437  BP: 98/60 (!) 94/53 (!) 110/51 (!) 113/45  Pulse: 88 75 75 78  Resp:  16 20 20   Temp: 98.3 F (36.8 C) 97.8 F (36.6 C) 98 F (36.7 C) 98.2 F (36.8 C)  TempSrc: Oral Oral Oral Oral  SpO2: 98% 99% 96% 98%  Weight:      Height:        Intake/Output Summary (Last 24 hours) at 12/05/2017 1810 Last data filed at 12/05/2017 1300 Gross per 24 hour  Intake 3224.69 ml  Output 1675 ml  Net 1549.69 ml   Filed Weights   12/04/17 2041  Weight: 41.7 kg   Examination: Physical Exam:  Constitutional: Thin cachectic Caucasian female in NAD and appears calm and comfortable Eyes: Lids and conjunctivae normal, sclerae anicteric  ENMT: External Ears, Nose appear normal. Grossly normal hearing. Mucous membranes are moist.  Neck: Appears normal, supple, no cervical masses, normal ROM, no appreciable thyromegaly; no JVD Respiratory: Diminished to auscultation bilaterally, no wheezing, rales, rhonchi or crackles. Normal respiratory effort and patient is not tachypenic. No accessory muscle use.  Cardiovascular: RRR, no murmurs / rubs / gallops. S1 and S2 auscultated. 1+ LE Edema in Right  LE Abdomen: Soft, non-tender, non-distended. No masses palpated. No appreciable hepatosplenomegaly. Bowel sounds positive x4.  GU: Deferred. Musculoskeletal: No clubbing / cyanosis of digits/nails.   Skin: No rashes, lesions, ulcers on a limited skin eval. No induration; Warm and dry.  Hip incisions appear clean dry and intact Neurologic: CN 2-12 grossly intact with no focal deficits. Romberg sign and cerebellar reflexes not assessed.  Psychiatric: Normal judgment and insight. Alert and oriented x 3. Normal mood and appropriate affect.   Data Reviewed: I have personally reviewed following labs and imaging studies  CBC: Recent Labs  Lab 12/04/17 1033 12/05/17 0511  WBC 19.1* 7.6  NEUTROABS 16.5*  --   HGB 14.4 10.1*  HCT 43.8 31.2*  MCV 93.4 94.8  PLT 211 270*   Basic Metabolic Panel: Recent Labs  Lab 12/04/17 1033 12/05/17 0511  NA 137 136  K 4.5 4.7  CL 97* 98  CO2 31 30  GLUCOSE 156* 131*  BUN 19 15  CREATININE 0.57 0.50  CALCIUM 9.2 8.6*   GFR: Estimated Creatinine Clearance: 41.8 mL/min (by C-G formula based on SCr of 0.5 mg/dL). Liver Function Tests: No results for input(s): AST, ALT, ALKPHOS, BILITOT, PROT, ALBUMIN in the last 168 hours. No results for input(s): LIPASE, AMYLASE in the last 168 hours. No results for input(s): AMMONIA  in the last 168 hours. Coagulation Profile: No results for input(s): INR, PROTIME in the last 168 hours. Cardiac Enzymes: No results for input(s): CKTOTAL, CKMB, CKMBINDEX, TROPONINI in the last 168 hours. BNP (last 3 results) No results for input(s): PROBNP in the last 8760 hours. HbA1C: No results for input(s): HGBA1C in the last 72 hours. CBG: No results for input(s): GLUCAP in the last 168 hours. Lipid Profile: No results for input(s): CHOL, HDL, LDLCALC, TRIG, CHOLHDL, LDLDIRECT in the last 72 hours. Thyroid Function Tests: No results for input(s): TSH, T4TOTAL, FREET4, T3FREE, THYROIDAB in the last 72 hours. Anemia  Panel: No results for input(s): VITAMINB12, FOLATE, FERRITIN, TIBC, IRON, RETICCTPCT in the last 72 hours. Sepsis Labs: No results for input(s): PROCALCITON, LATICACIDVEN in the last 168 hours.  Recent Results (from the past 240 hour(s))  Surgical pcr screen     Status: None   Collection Time: 12/04/17  4:57 PM  Result Value Ref Range Status   MRSA, PCR NEGATIVE NEGATIVE Final   Staphylococcus aureus NEGATIVE NEGATIVE Final    Comment: (NOTE) The Xpert SA Assay (FDA approved for NASAL specimens in patients 74 years of age and older), is one component of a comprehensive surveillance program. It is not intended to diagnose infection nor to guide or monitor treatment. Performed at [facility], [place] [address], [place], [place] [address]     Radiology Studies: Dg Ribs Unilateral W/chest Left  Result Date: 12/04/2017 CLINICAL DATA:  Hip fracture.  Fall with chest pain. EXAM: LEFT RIBS AND CHEST - 3+ VIEW COMPARISON:  01/21/2017 FINDINGS: Anterior left third, fourth, fifth, and sixth rib deformities that appear chronic and healed. No evidence of acute fracture. No hemothorax or pneumothorax. Normal heart size with aortic tortuosity stable from 2018. Hyperinflation. IMPRESSION: 1. Remote appearing left rib fractures. No evidence of acute fracture or acute cardiopulmonary disease. 2. COPD. Electronically Signed   By: Monte Fantasia M.D.   On: 12/04/2017 10:03   Pelvis Portable  Result Date: 12/04/2017 CLINICAL DATA:  Postop EXAM: PORTABLE PELVIS 1-2 VIEWS COMPARISON:  12/04/2017 FINDINGS: Pubic symphysis and rami are intact. Interval intramedullary rod and screw fixation of the right femur for intertrochanteric fracture. Displaced lesser trochanteric fracture fragment. IMPRESSION: Interval intramedullary rod and screw fixation of right intertrochanteric fracture with expected surgical changes Electronically Signed   By: Donavan Foil M.D.   On: 12/04/2017 20:50   Dg C-arm  1-60 Min-no Report  Result Date: 12/04/2017 Fluoroscopy was utilized by the requesting physician.  No radiographic interpretation.   Dg Hip Unilat W Or Wo Pelvis 2-3 Views Right  Result Date: 12/04/2017 CLINICAL DATA:  Fall with right hip pain EXAM: DG HIP (WITH OR WITHOUT PELVIS) 2-3V RIGHT COMPARISON:  None. FINDINGS: Intertrochanteric right femur fracture with varus angulation. Both hips are located. Osteopenia. IMPRESSION: Angulated intertrochanteric right femur fracture. Electronically Signed   By: Monte Fantasia M.D.   On: 12/04/2017 10:00   Dg Femur, Min 2 Views Right  Result Date: 12/04/2017 CLINICAL DATA:  Right hip fracture EXAM: RIGHT FEMUR 2 VIEWS COMPARISON:  12/04/2017 FINDINGS: Two low resolution intraoperative spot views of the right femur. Total fluoroscopy time was 31 seconds. The images demonstrate intramedullary rod and distal screw fixation of the right femur for intertrochanteric fracture. IMPRESSION: Intraoperative fluoroscopic assistance provided during surgical fixation of right femur fracture Electronically Signed   By: Donavan Foil M.D.   On: 12/04/2017 20:49   Dg Femur Min 2 Views Right  Result Date:  12/04/2017 CLINICAL DATA:  Right hip and pelvis pain secondary to a fall at home today. EXAM: RIGHT FEMUR 2 VIEWS COMPARISON:  None. FINDINGS: There is a comminuted intertrochanteric fracture of the proximal right femur with slight angulation. Displacement of avulsed lesser trochanter. Diffuse osteopenia. Minimal arthritic changes of the right hip joint. The distal femur is intact. IMPRESSION: Comminuted angulated intertrochanteric fracture of the proximal right femur as described. Osteopenia. Electronically Signed   By: Lorriane Shire M.D.   On: 12/04/2017 10:01   Scheduled Meds: . arformoterol  15 mcg Nebulization BID  . buPROPion  150 mg Oral Daily  . calcium-vitamin D  2 tablet Oral BID  . cholecalciferol  5,000 Units Oral Daily  . docusate sodium  100 mg Oral BID   . enoxaparin (LOVENOX) injection  30 mg Subcutaneous Q24H  . feeding supplement (ENSURE ENLIVE)  237 mL Oral BID BM  . umeclidinium bromide  1 puff Inhalation Daily  . vitamin B-12  1,000 mcg Oral Daily   Continuous Infusions: . sodium chloride 50 mL/hr at 12/05/17 0700    LOS: 1 day   Kerney Elbe, DO [facility] PAGER is on [place]  If 7PM-7AM, please contact night-coverage [email or web address] Password [email or web address] 12/05/2017, 6:10 PM

## 2017-12-05 NOTE — Progress Notes (Signed)
    Subjective:  Patient reports pain as mild to moderate.  Denies N/V/CP/SOB. States hip feels much better since surgery.  Objective:   VITALS:   Vitals:   12/04/17 2327 12/05/17 0127 12/05/17 0534 12/05/17 0934  BP: (!) 99/52 98/60 (!) 94/53 (!) 110/51  Pulse: 79 88 75 75  Resp:   16 20  Temp: 98.5 F (36.9 C) 98.3 F (36.8 C) 97.8 F (36.6 C) 98 F (36.7 C)  TempSrc: Oral Oral Oral Oral  SpO2: 96% 98% 99% 96%  Weight:      Height:        NAD ABD soft Sensation intact distally Intact pulses distally Dorsiflexion/Plantar flexion intact Incision: dressing C/D/I Compartment soft   Lab Results  Component Value Date   WBC 7.6 12/05/2017   HGB 10.1 (L) 12/05/2017   HCT 31.2 (L) 12/05/2017   MCV 94.8 12/05/2017   PLT 123 (L) 12/05/2017   BMET    Component Value Date/Time   NA 136 12/05/2017 0511   NA 133 (L) 10/19/2016 1127   K 4.7 12/05/2017 0511   K 4.6 10/19/2016 1127   CL 98 12/05/2017 0511   CO2 30 12/05/2017 0511   CO2 33 (H) 10/19/2016 1127   GLUCOSE 131 (H) 12/05/2017 0511   GLUCOSE 98 10/19/2016 1127   BUN 15 12/05/2017 0511   BUN 11.6 10/19/2016 1127   CREATININE 0.50 12/05/2017 0511   CREATININE 0.59 (L) 12/29/2016 1523   CREATININE 0.7 10/19/2016 1127   CALCIUM 8.6 (L) 12/05/2017 0511   CALCIUM 9.8 10/19/2016 1127   GFRNONAA >60 12/05/2017 0511   GFRAA >60 12/05/2017 0511     Assessment/Plan: 1 Day Post-Op   Active Problems:   Closed comminuted intertrochanteric fracture of proximal end of right femur (HCC)   WBAT with walker DVT ppx: Lovenox, SCDs, TEDS PO pain control PT/OT Dispo: D/C home with HHPT likely tomorrow   Hilton Cork Flecia Shutter 12/05/2017, 1:30 PM   Rod Can, MD Cell [phone]

## 2017-12-06 DIAGNOSIS — K869 Disease of pancreas, unspecified: Secondary | ICD-10-CM

## 2017-12-06 DIAGNOSIS — E43 Unspecified severe protein-calorie malnutrition: Secondary | ICD-10-CM

## 2017-12-06 DIAGNOSIS — S72141A Displaced intertrochanteric fracture of right femur, initial encounter for closed fracture: Principal | ICD-10-CM

## 2017-12-06 DIAGNOSIS — C220 Liver cell carcinoma: Secondary | ICD-10-CM

## 2017-12-06 DIAGNOSIS — D696 Thrombocytopenia, unspecified: Secondary | ICD-10-CM

## 2017-12-06 DIAGNOSIS — D62 Acute posthemorrhagic anemia: Secondary | ICD-10-CM

## 2017-12-06 DIAGNOSIS — J449 Chronic obstructive pulmonary disease, unspecified: Secondary | ICD-10-CM

## 2017-12-06 LAB — COMPREHENSIVE METABOLIC PANEL
ALT: 14 U/L (ref 0–44)
AST: 23 U/L (ref 15–41)
Albumin: 2.9 g/dL — ABNORMAL LOW (ref 3.5–5.0)
Alkaline Phosphatase: 48 U/L (ref 38–126)
Anion gap: 4 — ABNORMAL LOW (ref 5–15)
BUN: 19 mg/dL (ref 8–23)
CO2: 33 mmol/L — ABNORMAL HIGH (ref 22–32)
Calcium: 9 mg/dL (ref 8.9–10.3)
Chloride: 97 mmol/L — ABNORMAL LOW (ref 98–111)
Creatinine, Ser: 0.5 mg/dL (ref 0.44–1.00)
GFR calc Af Amer: >60 mL/min (ref >60)
GFR calc non Af Amer: >60 mL/min (ref >60)
Glucose, Bld: 106 mg/dL — ABNORMAL HIGH (ref 70–99)
Potassium: 4.2 mmol/L (ref 3.5–5.1)
Sodium: 134 mmol/L — ABNORMAL LOW (ref 135–145)
Total Bilirubin: 0.5 mg/dL (ref 0.3–1.2)
Total Protein: 5 g/dL — ABNORMAL LOW (ref 6.5–8.1)

## 2017-12-06 LAB — CBC WITH DIFFERENTIAL/PLATELET
Basophils Absolute: 0 10*3/uL (ref 0.0–0.1)
Basophils Relative: 0 %
Eosinophils Absolute: 0.1 10*3/uL (ref 0.0–0.7)
Eosinophils Relative: 1 %
HCT: 28.4 % — ABNORMAL LOW (ref 36.0–46.0)
Hemoglobin: 9.3 g/dL — ABNORMAL LOW (ref 12.0–15.0)
Lymphocytes Relative: 26 %
Lymphs Abs: 2.4 10*3/uL (ref 0.7–4.0)
MCH: 30.8 pg (ref 26.0–34.0)
MCHC: 32.7 g/dL (ref 30.0–36.0)
MCV: 94 fL (ref 78.0–100.0)
Monocytes Absolute: 1.2 10*3/uL — ABNORMAL HIGH (ref 0.1–1.0)
Monocytes Relative: 13 %
Neutro Abs: 5.5 10*3/uL (ref 1.7–7.7)
Neutrophils Relative %: 60 %
Platelets: 118 10*3/uL — ABNORMAL LOW (ref 150–400)
RBC: 3.02 MIL/uL — ABNORMAL LOW (ref 3.87–5.11)
RDW: 13.3 % (ref 11.5–15.5)
WBC: 9.2 10*3/uL (ref 4.0–10.5)

## 2017-12-06 LAB — MAGNESIUM: Magnesium: 1.8 mg/dL (ref 1.7–2.4)

## 2017-12-06 LAB — PHOSPHORUS: Phosphorus: 2.6 mg/dL (ref 2.5–4.6)

## 2017-12-06 MED ORDER — APIXABAN 2.5 MG PO TABS: 2.5000 mg | ORAL_TABLET | Freq: Two times a day (BID) | ORAL | 0 refills | Status: DC

## 2017-12-06 MED ORDER — DOCUSATE SODIUM 100 MG PO CAPS: 100.0000 mg | ORAL_CAPSULE | Freq: Two times a day (BID) | ORAL | 0 refills | Status: DC

## 2017-12-06 MED ORDER — BISACODYL 10 MG RE SUPP: 10.0000 mg | Freq: Every day | RECTAL | Status: DC | PRN

## 2017-12-06 MED ORDER — ENSURE ENLIVE PO LIQD: 237.0000 mL | Freq: Two times a day (BID) | ORAL | 12 refills | Status: DC

## 2017-12-06 MED ORDER — ENOXAPARIN (LOVENOX) PATIENT EDUCATION KIT
PACK | Freq: Once | Status: DC
  Filled 2017-12-06: qty 1

## 2017-12-06 MED ORDER — BISACODYL 10 MG RE SUPP: 10.0000 mg | Freq: Every day | RECTAL | 0 refills | Status: DC | PRN

## 2017-12-06 MED ORDER — POLYETHYLENE GLYCOL 3350 17 G PO PACK: 17.0000 g | PACK | Freq: Every day | ORAL | 0 refills | Status: DC

## 2017-12-06 MED ORDER — POLYETHYLENE GLYCOL 3350 17 G PO PACK
17.0000 g | PACK | Freq: Every day | ORAL | Status: DC
  Administered 2017-12-06: 17 g via ORAL
  Filled 2017-12-06: qty 1

## 2017-12-06 NOTE — Progress Notes (Signed)
Physical Therapy Treatment Patient Details Name: Tina Bailey MRN: [id number] DOB: 07-Apr-1945 Today's Date: 12/06/2017    History of Present Illness Pt was admitted for R comminuted intertrochanteric fx of R proximal femur:  s/p IM Nail.  PMH:  recent T 5-6 compression fxs, HCC, COPD and macular degeneration    PT Comments    Performed  Steps with family assisting. Ready for DC.   Follow Up Recommendations  Home health PT     Equipment Recommendations  Rolling walker with 5" wheels    Recommendations for Other Services       Precautions / Restrictions Precautions Precautions: Fall Restrictions Other Position/Activity Restrictions: wbat    Mobility  Bed Mobility Overal bed mobility: Needs Assistance Bed Mobility: Supine to Sit   Sidelying to sit: Supervision       General bed mobility comments: extra time, patient self assisted  right leg with belt to lift right leg onto bed, cues for technique  Transfers Overall transfer level: Needs assistance   Transfers: Sit to/from Stand   Stand pivot transfers: Min assist       General transfer comment: cues and assistance for RLE placement; cues for hand placement and assist to rise and steady,  much improved technique  Ambulation/Gait Ambulation/Gait assistance: Min assist Gait Distance (Feet): 20 Feet Assistive device: Rolling walker (2 wheeled) Gait Pattern/deviations: Step-to pattern;Antalgic     General Gait Details: cus for sequence and w trying to increase weight.   Stairs Stairs: Yes Stairs assistance: Mod assist Stair Management: One rail Left;Step to pattern;Backwards;With walker Number of Stairs: 3 General stair comments: son in law assisted on steps. OK to hole 1 rail and RW.   Wheelchair Mobility    Modified Rankin (Stroke Patients Only)       Balance                                            Cognition Arousal/Alertness: Awake/alert                                            Exercises    General Comments        Pertinent Vitals/Pain Faces Pain Scale: Hurts even more Pain Location: R hip/thigh Pain Descriptors / Indicators: Aching;Jabbing Pain Intervention(s): Limited activity within patient's tolerance;Patient requesting pain meds-RN notified;Monitored during session;Ice applied    Home Living                      Prior Function            PT Goals (current goals can now be found in the care plan section) Progress towards PT goals: Progressing toward goals    Frequency    Min 6X/week      PT Plan Current plan remains appropriate    Co-evaluation              AM-PAC PT "6 Clicks" Daily Activity  Outcome Measure  Difficulty turning over in bed (including adjusting bedclothes, sheets and blankets)?: A Little Difficulty moving from lying on back to sitting on the side of the bed? : A Little Difficulty sitting down on and standing up from a chair with arms (e.g., wheelchair, bedside commode, etc,.)?: A Lot Help needed moving to  and from a bed to chair (including a wheelchair)?: A Lot Help needed walking in hospital room?: A Lot Help needed climbing 3-5 steps with a railing? : A Lot 6 Click Score: 14    End of Session   Activity Tolerance: Patient tolerated treatment well Patient left: in bed;with family/visitor present Nurse Communication: Mobility status PT Visit Diagnosis: Unsteadiness on feet (R26.81);History of falling (Z91.81);Pain Pain - Right/Left: Right Pain - part of body: Hip     Time: 1245-8099 PT Time Calculation (min) (ACUTE ONLY): 18 min  Charges:  $Gait Training: 8-22 mins $Therapeutic Exercise: 8-22 mins $Therapeutic Activity: 8-22 mins                     Tresa Endo PT Acute Rehabilitation Services Pager [phone] Office [phone]    Claretha Cooper 12/06/2017, 3:47 PM

## 2017-12-06 NOTE — Anesthesia Postprocedure Evaluation (Signed)
Anesthesia Post Note  Patient: Tina Bailey  Procedure(s) Performed: INTRAMEDULLARY (IM) NAIL INTERTROCHANTRIC (Right Leg Upper)     Patient location during evaluation: PACU Anesthesia Type: Spinal and Regional Level of consciousness: oriented and awake and alert Pain management: pain level controlled Vital Signs Assessment: post-procedure vital signs reviewed and stable Respiratory status: spontaneous breathing, respiratory function stable and patient connected to nasal cannula oxygen Cardiovascular status: blood pressure returned to baseline and stable Postop Assessment: no headache, no backache, no apparent nausea or vomiting, spinal receding and patient able to bend at knees Anesthetic complications: no    Last Vitals:  Vitals:   12/05/17 2245 12/06/17 0528  BP: (!) 102/52 (!) 106/59  Pulse: 88 94  Resp: 16 16  Temp: 37 C 36.7 C  SpO2:  95%    Last Pain:  Vitals:   12/06/17 0745  TempSrc:   PainSc: Asleep                 Shatasha Lambing,W. EDMOND

## 2017-12-06 NOTE — Progress Notes (Signed)
Physical Therapy Treatment Patient Details Name: Tina Bailey MRN: [id number] DOB: 08/19/1945 Today's Date: 12/06/2017    History of Present Illness Pt was admitted for R comminuted intertrochanteric fx of R proximal femur:  s/p IM Nail.  PMH:  recent T 5-6 compression fxs, HCC, COPD and macular degeneration    PT Comments     The patient is progressing well. Plans Dc after practice steps   Follow Up Recommendations  Home health PT     Equipment Recommendations  Rolling walker with 5" wheels    Recommendations for Other Services       Precautions / Restrictions Precautions Precautions: Fall Restrictions Other Position/Activity Restrictions: wbat    Mobility  Bed Mobility Overal bed mobility: Needs Assistance Bed Mobility: Supine to Sit   Sidelying to sit: Supervision       General bed mobility comments: extra time, patient self assisted  right leg with left to slide lefg to bed edge. demonstrated use of belt to self assist the leg onto the bed.  Transfers                    Ambulation/Gait                 Stairs             Wheelchair Mobility    Modified Rankin (Stroke Patients Only)       Balance                                            Cognition Arousal/Alertness: Awake/alert                                            Exercises General Exercises - Lower Extremity Ankle Circles/Pumps: AROM;Both;10 reps;Supine Quad Sets: AROM;Both;10 reps;Supine Short Arc Quad: AROM;Right;10 reps;Supine Heel Slides: AAROM;Right;5 reps;Supine Hip ABduction/ADduction: AAROM;Right;5 reps;Supine    General Comments        Pertinent Vitals/Pain Faces Pain Scale: Hurts even more Pain Location: R hip/thigh Pain Descriptors / Indicators: Aching;Jabbing Pain Intervention(s): Limited activity within patient's tolerance;Monitored during session    Home Living                      Prior  Function            PT Goals (current goals can now be found in the care plan section) Progress towards PT goals: Progressing toward goals    Frequency    Min 6X/week      PT Plan Current plan remains appropriate    Co-evaluation              AM-PAC PT "6 Clicks" Daily Activity  Outcome Measure  Difficulty turning over in bed (including adjusting bedclothes, sheets and blankets)?: A Little Difficulty moving from lying on back to sitting on the side of the bed? : A Little Difficulty sitting down on and standing up from a chair with arms (e.g., wheelchair, bedside commode, etc,.)?: A Lot Help needed moving to and from a bed to chair (including a wheelchair)?: A Lot Help needed walking in hospital room?: A Lot Help needed climbing 3-5 steps with a railing? : Total 6 Click Score: 13    End of Session  Activity Tolerance: Patient tolerated treatment well Patient left: in bed;with family/visitor present Nurse Communication: Mobility status PT Visit Diagnosis: Unsteadiness on feet (R26.81);History of falling (Z91.81);Pain Pain - Right/Left: Right Pain - part of body: Hip     Time: 2297-9892 PT Time Calculation (min) (ACUTE ONLY): 34 min  Charges:   $Therapeutic Exercise: 8-22 mins $Therapeutic Activity: 8-22 mins                     Tresa Endo PT Acute Rehabilitation Services Pager [phone] Office [phone]    Claretha Cooper 12/06/2017, 3:43 PM

## 2017-12-06 NOTE — Progress Notes (Signed)
Plan: Home  No csw needs identified CSW signing off.   Kathrin Greathouse, Marlinda Mike, MSW Clinical Social Worker  [phone] 12/06/2017  1:37 PM

## 2017-12-06 NOTE — Progress Notes (Signed)
Pt refused her breathing medication. Pt states she does not need. Pt states she only takes at home as needed. No distress noted at this time.

## 2017-12-06 NOTE — Care Management Note (Signed)
Case Management Note  Patient Details  Name: Tina Bailey MRN: [id number] Date of Birth: 26-May-1945  Subjective/Objective:      Discharge planning, spoke with patient and spouse at bedside. Have chosen [facility] PT, evaluate and treat. Needs RW and 3n1.            Action/Plan: Contacted [facility] for referral. Contacted [facility] to deliver to room. [phone]     Expected Discharge Date:  (unknown)               Expected Discharge Plan:  [place]  In-House Referral:  NA  Discharge planning Services  CM Consult  Post Acute Care Choice:  Home Health, Durable Medical Equipment Choice offered to:  Patient, Adult Children  DME Arranged:  3-N-1, Walker rolling DME Agency:  [place]:  PT [place]:  [facility] ([facility]  Status of Service:  Completed, signed off  If discussed at [employer], dates discussed:    Additional Comments:  Guadalupe Maple, RN 12/06/2017, 11:55 AM

## 2017-12-06 NOTE — Progress Notes (Signed)
Occupational Therapy Treatment Patient Details Name: Tina Bailey MRN: [id number] DOB: [birth year] Today's Date: 12/06/2017    History of present illness Pt was admitted for R comminuted intertrochanteric fx of R proximal femur:  s/p IM Nail.  PMH:  recent T 5-6 compression fxs, HCC, COPD and macular degeneration   OT comments  All education completed this session.   Follow Up Recommendations  Supervision/Assistance - 24 hour;Home health OT    Equipment Recommendations  3 in 1 bedside commode    Recommendations for Other Services      Precautions / Restrictions Precautions Precautions: Fall Restrictions Other Position/Activity Restrictions: wbat       Mobility Bed Mobility Overal bed mobility: Needs Assistance Bed Mobility: Supine to Sit          General bed mobility comments: oob  Transfers Overall transfer level: Needs assistance Equipment used: Rolling walker (2 wheeled) Transfers: Sit to/from Stand Sit to Stand: Min guard Stand pivot transfers: Min guard       General transfer comment: cues for LE placement    Balance                                           ADL either performed or assessed with clinical judgement   ADL                                         General ADL Comments: used sock aide and educated on reacher vs kitchen tongs for adls.  Able to do use despite visual difficulties.  Pt has a long sponge at home already.  Educated on general safety with RW.  Pt/family verbalize understanding. Pt feels comfortable with using tub bench.  Pt did not have clothing here; family will bring it in prior to d/c     Vision       Perception     Praxis      Cognition Arousal/Alertness: Awake/alert Behavior During Therapy: [facility] for tasks assessed/performed;Anxious Overall Cognitive Status: Within Functional Limits for tasks assessed                                          Exercises     Shoulder Instructions       General Comments      Pertinent Vitals/ Pain       Faces Pain Scale: Hurts even more Pain Location: R hip/thigh Pain Descriptors / Indicators: Aching;Jabbing Pain Intervention(s): Limited activity within patient's tolerance;Patient requesting pain meds-RN notified;Monitored during session;Ice applied  Home Living                                          Prior Functioning/Environment              Frequency  Min 2X/week        Progress Toward Goals  OT Goals(current goals can now be found in the care plan section)  Progress towards OT goals: Progressing toward goals(ready for d/c)     Plan      Co-evaluation  AM-PAC PT "6 Clicks" Daily Activity     Outcome Measure   Help from another person eating meals?: A Little Help from another person taking care of personal grooming?: A Little Help from another person toileting, which includes using toliet, bedpan, or urinal?: A Lot Help from another person bathing (including washing, rinsing, drying)?: A Lot Help from another person to put on and taking off regular upper body clothing?: A Little Help from another person to put on and taking off regular lower body clothing?: Total 6 Click Score: 14    End of Session    OT Visit Diagnosis: Pain Pain - Right/Left: Right Pain - part of body: Hip;Leg   Activity Tolerance Patient tolerated treatment well   Patient Left in chair;with call bell/phone within reach;with chair alarm set   Nurse Communication          Time: 1450-1510 OT Time Calculation (min): 20 min  Charges: OT General Charges $OT Visit: 1 Visit OT Treatments $Self Care/Home Management : 8-22 mins  Lesle Chris, OTR/L Acute Rehabilitation Services [phone] [facility] pager [phone] office 12/06/2017   Lakshmi Sundeen 12/06/2017, 3:58 PM

## 2017-12-06 NOTE — Discharge Summary (Signed)
Physician Discharge Summary  Tina Bailey KYH:[id number] DOB: 04/29/1945 DOA: 12/04/2017  PCP: Midge Minium, MD  Admit date: 12/04/2017 Discharge date: 12/06/2017  Admitted From: home Disposition:  home   Recommendations for Outpatient Follow-up:  1. Please repeat CBC in 1 wk.   Home Health:  ordered   Discharge Condition:  stable   CODE STATUS:  Full code   Consultations:  orthos    Discharge Diagnoses:  Principal Problem:   Closed comminuted intertrochanteric fracture of proximal end of right femur ([place]) Active Problems:   Pancreatic mass   Hepatocellular carcinoma (HCC)   COPD GOLD IV / still somking   Cigarette smoker   Severe protein-calorie malnutrition ([place])   Acute blood loss anemia   Thrombocytopenia (HCC)     Brief Summary:  71 with hepatocellular carcinoma, pancreatic cystic lesion, COPD stage 3, osteoporosis with T 11 compression fracture and back pain, PAD who had a mechanical fall when tripping over her dog and developed right hip pain. She was admitted for a right femur fracture.  Hospital Course:  Closed comminuted intertrochanteric fracture of proximal end of right femur  status post intramedullary fixation   - Lovenox for DVT prophylaxis - will go home with HHPT  Hepatocellular carcinoma -Stable and under the care of Dr. Burr Medico  Acute blood loss anemia and acute thrombocytopenia  - Hb dropped from 14.4 to 9.3- no need for transfusion - Platelets 211 dropped to 118 - recommend repeat CBC in 1 wk  COPD Gold stage III with no oxygen requirement -stable- Still smoking 2 to 3 cigarettes a day   Bipolar  -continue Seroquel 12.5 at bedtime, bupropion 150 daily, lorazepam 1-2 every 6 as needed anxiety  Recent rib fractures -Continue Pain Control  Severe protein malnutrition -Nutrition is consulted for further evaluation recommendations -Continue with Ensure Enlive p.o. twice daily  Discharge Exam: Vitals:   12/05/17 2245 12/06/17  0528  BP: (!) 102/52 (!) 106/59  Pulse: 88 94  Resp: 16 16  Temp: 98.6 F (37 C) 98 F (36.7 C)  SpO2:  95%   Vitals:   12/05/17 0934 12/05/17 1437 12/05/17 2245 12/06/17 0528  BP: (!) 110/51 (!) 113/45 (!) 102/52 (!) 106/59  Pulse: 75 78 88 94  Resp: 20 20 16 16   Temp: 98 F (36.7 C) 98.2 F (36.8 C) 98.6 F (37 C) 98 F (36.7 C)  TempSrc: Oral Oral Oral Oral  SpO2: 96% 98%  95%  Weight:      Height:        General: Pt is alert, awake, not in acute distress Cardiovascular: RRR, S1/S2 +, no rubs, no gallops Respiratory: CTA bilaterally, no wheezing, no rhonchi Abdominal: Soft, NT, ND, bowel sounds + Extremities: no edema, no cyanosis   Discharge Instructions  Discharge Instructions    Diet - low sodium heart healthy   Complete by:  As directed    Increase activity slowly   Complete by:  As directed      Allergies as of 12/06/2017      Reactions   Sertraline Hcl    Zoloft, had a terrible reaction   Amoxicillin-pot Clavulanate Nausea Only   Has patient had a PCN reaction causing immediate rash, facial/tongue/throat swelling, SOB or lightheadedness with hypotension: No Has patient had a PCN reaction causing severe rash involving mucus membranes or skin necrosis: No Has patient had a PCN reaction that required hospitalization No Has patient had a PCN reaction occurring within the last 10 years: No  If all of the above answers are "NO", then may proceed with Cephalosporin use.   Clindamycin/lincomycin Other (See Comments)   Felt like it "burned out" her stomach/took a large dose   Paroxetine Hcl Rash   Sulfa Antibiotics Rash      Medication List    STOP taking these medications   buPROPion 150 MG 24 hr tablet Commonly known as:  WELLBUTRIN XL     TAKE these medications   bisacodyl 10 MG suppository Commonly known as:  DULCOLAX Place 1 suppository (10 mg total) rectally daily as needed for moderate constipation.   CALCIUM 500+D HIGH POTENCY PO Take 1  tablet by mouth daily.   CENTRUM SILVER ULTRA WOMENS PO Take 1 tablet by mouth daily.   CLEAR EYES FOR DRY EYES 1-0.25 % Soln Generic drug:  Carboxymethylcellul-Glycerin Place 1-2 drops into both eyes daily as needed (dry eyes).   docusate sodium 100 MG capsule Commonly known as:  COLACE Take 1 capsule (100 mg total) by mouth 2 (two) times daily.   enoxaparin 30 MG/0.3ML injection Commonly known as:  LOVENOX Inject 0.3 mLs (30 mg total) into the skin daily.   feeding supplement (ENSURE ENLIVE) Liqd Take 237 mLs by mouth 2 (two) times daily between meals. What changed:    how much to take  when to take this   Glycopyrrolate-Formoterol 9-4.8 MCG/ACT Aero Inhale 2 puffs into the lungs 2 (two) times daily. What changed:    when to take this  reasons to take this   ipratropium-albuterol 0.5-2.5 (3) MG/3ML Soln Commonly known as:  DUONEB Take 3 mLs by nebulization every 4 (four) hours as needed (SOB).   naproxen sodium 220 MG tablet Commonly known as:  ALEVE Take 440 mg by mouth every other day.   Oxycodone HCl 10 MG Tabs Take 1 tablet (10 mg total) by mouth every 6 (six) hours as needed for severe pain (if norco not effective). What changed:    medication strength  how much to take  when to take this  reasons to take this  additional instructions   polyethylene glycol packet Commonly known as:  MIRALAX / GLYCOLAX Take 17 g by mouth daily.   Vitamin B-12 1000 MCG Subl Place 1,000 mcg under the tongue daily.   Vitamin D3 5000 units Caps Take 5,000 Units by mouth daily.            Durable Medical Equipment  (From admission, onward)         Start     Ordered   12/06/17 1157  For home use only DME Walker rolling  Once    Question:  Patient needs a walker to treat with the following condition  Answer:  History of orthopedic surgery   12/06/17 1157   12/06/17 1157  For home use only DME 3 n 1  Once     12/06/17 1157         Follow-up  Information    Swinte[place], Aaron Edelman, MD. Schedule an appointment as soon as possible for a visit in 2 weeks.   Specialty:  Orthopedic Surgery Why:  For wound re-check Contact information: [address] [place] [address] [phone]        [facility], [facility] Follow up.   Specialty:  Home Health Services Why:  physical therapy Contact information: [place] [address] [phone]          Allergies  Allergen Reactions  . Sertraline Hcl  Zoloft, had a terrible reaction  . Amoxicillin-Pot Clavulanate Nausea Only    Has patient had a PCN reaction causing immediate rash, facial/tongue/throat swelling, SOB or lightheadedness with hypotension: No Has patient had a PCN reaction causing severe rash involving mucus membranes or skin necrosis: No Has patient had a PCN reaction that required hospitalization No Has patient had a PCN reaction occurring within the last 10 years: No If all of the above answers are "NO", then may proceed with Cephalosporin use.   . Clindamycin/Lincomycin Other (See Comments)    Felt like it "burned out" her stomach/took a large dose  . Paroxetine Hcl Rash  . Sulfa Antibiotics Rash     Procedures/Studies:  Intramedullary fixation, Right femur.   Dg Ribs Unilateral W/chest Left  Result Date: 12/04/2017 CLINICAL DATA:  Hip fracture.  Fall with chest pain. EXAM: LEFT RIBS AND CHEST - 3+ VIEW COMPARISON:  01/21/2017 FINDINGS: Anterior left third, fourth, fifth, and sixth rib deformities that appear chronic and healed. No evidence of acute fracture. No hemothorax or pneumothorax. Normal heart size with aortic tortuosity stable from 2018. Hyperinflation. IMPRESSION: 1. Remote appearing left rib fractures. No evidence of acute fracture or acute cardiopulmonary disease. 2. COPD. Electronically Signed   By: Monte Fantasia M.D.   On: 12/04/2017 10:03   Pelvis Portable  Result Date: 12/04/2017 CLINICAL DATA:  Postop  EXAM: PORTABLE PELVIS 1-2 VIEWS COMPARISON:  12/04/2017 FINDINGS: Pubic symphysis and rami are intact. Interval intramedullary rod and screw fixation of the right femur for intertrochanteric fracture. Displaced lesser trochanteric fracture fragment. IMPRESSION: Interval intramedullary rod and screw fixation of right intertrochanteric fracture with expected surgical changes Electronically Signed   By: Donavan Foil M.D.   On: 12/04/2017 20:50   Dg C-arm 1-60 Min-no Report  Result Date: 12/04/2017 Fluoroscopy was utilized by the requesting physician.  No radiographic interpretation.   Dg Hip Unilat W Or Wo Pelvis 2-3 Views Right  Result Date: 12/04/2017 CLINICAL DATA:  Fall with right hip pain EXAM: DG HIP (WITH OR WITHOUT PELVIS) 2-3V RIGHT COMPARISON:  None. FINDINGS: Intertrochanteric right femur fracture with varus angulation. Both hips are located. Osteopenia. IMPRESSION: Angulated intertrochanteric right femur fracture. Electronically Signed   By: Monte Fantasia M.D.   On: 12/04/2017 10:00   Dg Femur, Min 2 Views Right  Result Date: 12/04/2017 CLINICAL DATA:  Right hip fracture EXAM: RIGHT FEMUR 2 VIEWS COMPARISON:  12/04/2017 FINDINGS: Two low resolution intraoperative spot views of the right femur. Total fluoroscopy time was 31 seconds. The images demonstrate intramedullary rod and distal screw fixation of the right femur for intertrochanteric fracture. IMPRESSION: Intraoperative fluoroscopic assistance provided during surgical fixation of right femur fracture Electronically Signed   By: Donavan Foil M.D.   On: 12/04/2017 20:49   Dg Femur Min 2 Views Right  Result Date: 12/04/2017 CLINICAL DATA:  Right hip and pelvis pain secondary to a fall at home today. EXAM: RIGHT FEMUR 2 VIEWS COMPARISON:  None. FINDINGS: There is a comminuted intertrochanteric fracture of the proximal right femur with slight angulation. Displacement of avulsed lesser trochanter. Diffuse osteopenia. Minimal arthritic  changes of the right hip joint. The distal femur is intact. IMPRESSION: Comminuted angulated intertrochanteric fracture of the proximal right femur as described. Osteopenia. Electronically Signed   By: Lorriane Shire M.D.   On: 12/04/2017 10:01     The results of significant diagnostics from this hospitalization (including imaging, microbiology, ancillary and laboratory) are listed below for reference.     Microbiology: Recent  Results (from the past 240 hour(s))  Surgical pcr screen     Status: None   Collection Time: 12/04/17  4:57 PM  Result Value Ref Range Status   MRSA, PCR NEGATIVE NEGATIVE Final   Staphylococcus aureus NEGATIVE NEGATIVE Final    Comment: (NOTE) The Xpert SA Assay (FDA approved for NASAL specimens in patients 54 years of age and older), is one component of a comprehensive surveillance program. It is not intended to diagnose infection nor to guide or monitor treatment. Performed at [facility], [place] [address] [place], [place] [address]      Labs: BNP (last 3 results) No results for input(s): BNP in the last 8760 hours. Basic Metabolic Panel: Recent Labs  Lab 12/04/17 1033 12/05/17 0511 12/06/17 0457  NA 137 136 134*  K 4.5 4.7 4.2  CL 97* 98 97*  CO2 31 30 33*  GLUCOSE 156* 131* 106*  BUN 19 15 19   CREATININE 0.57 0.50 0.50  CALCIUM 9.2 8.6* 9.0  MG  --   --  1.8  PHOS  --   --  2.6   Liver Function Tests: Recent Labs  Lab 12/06/17 0457  AST 23  ALT 14  ALKPHOS 48  BILITOT 0.5  PROT 5.0*  ALBUMIN 2.9*   No results for input(s): LIPASE, AMYLASE in the last 168 hours. No results for input(s): AMMONIA in the last 168 hours. CBC: Recent Labs  Lab 12/04/17 1033 12/05/17 0511 12/06/17 0457  WBC 19.1* 7.6 9.2  NEUTROABS 16.5*  --  5.5  HGB 14.4 10.1* 9.3*  HCT 43.8 31.2* 28.4*  MCV 93.4 94.8 94.0  PLT 211 123* 118*   Cardiac Enzymes: No results for input(s): CKTOTAL, CKMB, CKMBINDEX, TROPONINI in the last 168  hours. BNP: Invalid input(s): POCBNP CBG: No results for input(s): GLUCAP in the last 168 hours. D-Dimer No results for input(s): DDIMER in the last 72 hours. Hgb A1c No results for input(s): HGBA1C in the last 72 hours. Lipid Profile No results for input(s): CHOL, HDL, LDLCALC, TRIG, CHOLHDL, LDLDIRECT in the last 72 hours. Thyroid function studies No results for input(s): TSH, T4TOTAL, T3FREE, THYROIDAB in the last 72 hours.  Invalid input(s): FREET3 Anemia work up No results for input(s): VITAMINB12, FOLATE, FERRITIN, TIBC, IRON, RETICCTPCT in the last 72 hours. Urinalysis    Component Value Date/Time   COLORURINE YELLOW 06/10/2015 2009   APPEARANCEUR CLEAR 06/10/2015 2009   LABSPEC 1.012 06/10/2015 2009   PHURINE 7.0 06/10/2015 2009   GLUCOSEU NEGATIVE 06/10/2015 2009   HGBUR NEGATIVE 06/10/2015 2009   BILIRUBINUR negative 04/28/2017 1621   KETONESUR NEGATIVE 06/10/2015 2009   PROTEINUR negative 04/28/2017 1621   PROTEINUR NEGATIVE 06/10/2015 2009   UROBILINOGEN 0.2 04/28/2017 1621   NITRITE negative 04/28/2017 1621   NITRITE NEGATIVE 06/10/2015 2009   LEUKOCYTESUR Negative 04/28/2017 1621   Sepsis Labs Invalid input(s): PROCALCITONIN,  WBC,  LACTICIDVEN Microbiology Recent Results (from the past 240 hour(s))  Surgical pcr screen     Status: None   Collection Time: 12/04/17  4:57 PM  Result Value Ref Range Status   MRSA, PCR NEGATIVE NEGATIVE Final   Staphylococcus aureus NEGATIVE NEGATIVE Final    Comment: (NOTE) The Xpert SA Assay (FDA approved for NASAL specimens in patients 26 years of age and older), is one component of a comprehensive surveillance program. It is not intended to diagnose infection nor to guide or monitor treatment. Performed at [facility], [address] [place], [place]  [address]      Time coordinating discharge in minutes: 60  SIGNED:   Debbe Odea, MD  [facility] 12/06/2017, 1:23 PM Pager   If  7PM-7AM, please contact night-coverage [email or web address] Password [email or web address]

## 2017-12-09 DIAGNOSIS — Z7901 Long term (current) use of anticoagulants: Secondary | ICD-10-CM | POA: Diagnosis not present

## 2017-12-09 DIAGNOSIS — H5462 Unqualified visual loss, left eye, normal vision right eye: Secondary | ICD-10-CM | POA: Diagnosis not present

## 2017-12-09 DIAGNOSIS — E43 Unspecified severe protein-calorie malnutrition: Secondary | ICD-10-CM | POA: Diagnosis not present

## 2017-12-09 DIAGNOSIS — M7501 Adhesive capsulitis of right shoulder: Secondary | ICD-10-CM | POA: Diagnosis not present

## 2017-12-09 DIAGNOSIS — I739 Peripheral vascular disease, unspecified: Secondary | ICD-10-CM | POA: Diagnosis not present

## 2017-12-09 DIAGNOSIS — M519 Unspecified thoracic, thoracolumbar and lumbosacral intervertebral disc disorder: Secondary | ICD-10-CM | POA: Diagnosis not present

## 2017-12-09 DIAGNOSIS — M8000XD Age-related osteoporosis with current pathological fracture, unspecified site, subsequent encounter for fracture with routine healing: Secondary | ICD-10-CM | POA: Diagnosis not present

## 2017-12-09 DIAGNOSIS — C220 Liver cell carcinoma: Secondary | ICD-10-CM | POA: Diagnosis not present

## 2017-12-09 DIAGNOSIS — M8580 Other specified disorders of bone density and structure, unspecified site: Secondary | ICD-10-CM | POA: Diagnosis not present

## 2017-12-09 DIAGNOSIS — Z9181 History of falling: Secondary | ICD-10-CM | POA: Diagnosis not present

## 2017-12-09 DIAGNOSIS — H3530 Unspecified macular degeneration: Secondary | ICD-10-CM | POA: Diagnosis not present

## 2017-12-09 DIAGNOSIS — J449 Chronic obstructive pulmonary disease, unspecified: Secondary | ICD-10-CM | POA: Diagnosis not present

## 2017-12-09 DIAGNOSIS — Z7951 Long term (current) use of inhaled steroids: Secondary | ICD-10-CM | POA: Diagnosis not present

## 2017-12-09 DIAGNOSIS — K869 Disease of pancreas, unspecified: Secondary | ICD-10-CM | POA: Diagnosis not present

## 2017-12-09 DIAGNOSIS — M797 Fibromyalgia: Secondary | ICD-10-CM | POA: Diagnosis not present

## 2017-12-09 DIAGNOSIS — M8008XD Age-related osteoporosis with current pathological fracture, vertebra(e), subsequent encounter for fracture with routine healing: Secondary | ICD-10-CM | POA: Diagnosis not present

## 2017-12-09 DIAGNOSIS — S72141D Displaced intertrochanteric fracture of right femur, subsequent encounter for closed fracture with routine healing: Secondary | ICD-10-CM | POA: Diagnosis not present

## 2017-12-09 DIAGNOSIS — M1990 Unspecified osteoarthritis, unspecified site: Secondary | ICD-10-CM | POA: Diagnosis not present

## 2017-12-11 ENCOUNTER — Telehealth: Payer: Self-pay | Admitting: General Practice

## 2017-12-11 NOTE — Telephone Encounter (Signed)
Pt daughter called back and I got more info, per home health nurse they think she may have chest or spin fracture and nothing to do with the hip.  Pt is still in pain and home health nurse and ortho had told her to contact PCP about this getting this xrayed and looked at?  Daughter is just trying to get the pt pain under control and see what is causing it    Best number  [phone]

## 2017-12-11 NOTE — Telephone Encounter (Signed)
This request would need to go to ortho correct? Pt sees [facility] for current treatment.   Copied from [place] [phone]. Topic: General - Other >> Dec 11, 2017  8:04 AM Judyann Munson wrote: Reason for CRM: patient daughter [name] , is calling to request a X-ray for the patient had surgery on 12-04-17 for a broken hip. Please advise. Best contact number is [phone].

## 2017-12-11 NOTE — Telephone Encounter (Signed)
I am not sure what she needs an xray of or why, but she would need to contact ortho ([facility]) about any imaging

## 2017-12-11 NOTE — Telephone Encounter (Signed)
[place] for [facility] to Discuss results / PCP recommendations / Schedule patient.  CRM created.

## 2017-12-11 NOTE — Telephone Encounter (Signed)
Called [phone] to advise of PCP recommendations. Line was busy.

## 2017-12-11 NOTE — Telephone Encounter (Signed)
She sees Dr Nelva Bush for her back.  She needs to contact their office if they are concerned about a spinal fracture.  I do not treat nor manage spine fractures and this will need to be assessed.  She is also under a pain contract with their office so I am not able to assist in pain management either.  I agree that this needs to be addressed, it just cannot be done here.

## 2017-12-13 ENCOUNTER — Ambulatory Visit (HOSPITAL_COMMUNITY): Admission: RE | Admit: 2017-12-13 | Payer: Medicare Other | Source: Ambulatory Visit

## 2017-12-13 ENCOUNTER — Other Ambulatory Visit (HOSPITAL_COMMUNITY): Payer: Self-pay | Admitting: Physical Medicine and Rehabilitation

## 2017-12-13 DIAGNOSIS — M79604 Pain in right leg: Secondary | ICD-10-CM

## 2017-12-13 DIAGNOSIS — M546 Pain in thoracic spine: Secondary | ICD-10-CM | POA: Diagnosis not present

## 2017-12-13 DIAGNOSIS — Z79891 Long term (current) use of opiate analgesic: Secondary | ICD-10-CM | POA: Diagnosis not present

## 2017-12-13 DIAGNOSIS — R072 Precordial pain: Secondary | ICD-10-CM | POA: Diagnosis not present

## 2017-12-13 DIAGNOSIS — G8929 Other chronic pain: Secondary | ICD-10-CM | POA: Diagnosis not present

## 2017-12-13 DIAGNOSIS — M7989 Other specified soft tissue disorders: Principal | ICD-10-CM

## 2017-12-14 ENCOUNTER — Ambulatory Visit (HOSPITAL_COMMUNITY)
Admission: RE | Admit: 2017-12-14 | Discharge: 2017-12-14 | Disposition: A | Discharge status: Home / Self Care | Payer: Medicare Other | Source: Ambulatory Visit | Attending: Physical Medicine and Rehabilitation | Admitting: Physical Medicine and Rehabilitation

## 2017-12-14 DIAGNOSIS — I779 Disorder of arteries and arterioles, unspecified: Secondary | ICD-10-CM | POA: Insufficient documentation

## 2017-12-14 DIAGNOSIS — M7989 Other specified soft tissue disorders: Secondary | ICD-10-CM | POA: Diagnosis not present

## 2017-12-14 DIAGNOSIS — M79604 Pain in right leg: Secondary | ICD-10-CM | POA: Diagnosis not present

## 2017-12-14 NOTE — Progress Notes (Addendum)
RLE venous duplex prelim: negative for DVT.  Occluded right femoral artery noted- patient has Hx of peripheral occlusive arterial disease.   Landry Mellow, RDMS, RVT   Attempted call report with no answer.

## 2017-12-15 DIAGNOSIS — M8580 Other specified disorders of bone density and structure, unspecified site: Secondary | ICD-10-CM | POA: Diagnosis not present

## 2017-12-15 DIAGNOSIS — K869 Disease of pancreas, unspecified: Secondary | ICD-10-CM | POA: Diagnosis not present

## 2017-12-15 DIAGNOSIS — M1990 Unspecified osteoarthritis, unspecified site: Secondary | ICD-10-CM | POA: Diagnosis not present

## 2017-12-15 DIAGNOSIS — I739 Peripheral vascular disease, unspecified: Secondary | ICD-10-CM | POA: Diagnosis not present

## 2017-12-15 DIAGNOSIS — J449 Chronic obstructive pulmonary disease, unspecified: Secondary | ICD-10-CM | POA: Diagnosis not present

## 2017-12-15 DIAGNOSIS — M8000XD Age-related osteoporosis with current pathological fracture, unspecified site, subsequent encounter for fracture with routine healing: Secondary | ICD-10-CM | POA: Diagnosis not present

## 2017-12-15 DIAGNOSIS — C220 Liver cell carcinoma: Secondary | ICD-10-CM | POA: Diagnosis not present

## 2017-12-15 DIAGNOSIS — M8008XD Age-related osteoporosis with current pathological fracture, vertebra(e), subsequent encounter for fracture with routine healing: Secondary | ICD-10-CM | POA: Diagnosis not present

## 2017-12-15 DIAGNOSIS — Z7901 Long term (current) use of anticoagulants: Secondary | ICD-10-CM | POA: Diagnosis not present

## 2017-12-15 DIAGNOSIS — Z9181 History of falling: Secondary | ICD-10-CM | POA: Diagnosis not present

## 2017-12-15 DIAGNOSIS — E43 Unspecified severe protein-calorie malnutrition: Secondary | ICD-10-CM | POA: Diagnosis not present

## 2017-12-15 DIAGNOSIS — M797 Fibromyalgia: Secondary | ICD-10-CM | POA: Diagnosis not present

## 2017-12-15 DIAGNOSIS — M7501 Adhesive capsulitis of right shoulder: Secondary | ICD-10-CM | POA: Diagnosis not present

## 2017-12-15 DIAGNOSIS — Z7951 Long term (current) use of inhaled steroids: Secondary | ICD-10-CM | POA: Diagnosis not present

## 2017-12-15 DIAGNOSIS — H5462 Unqualified visual loss, left eye, normal vision right eye: Secondary | ICD-10-CM | POA: Diagnosis not present

## 2017-12-15 DIAGNOSIS — S72141D Displaced intertrochanteric fracture of right femur, subsequent encounter for closed fracture with routine healing: Secondary | ICD-10-CM | POA: Diagnosis not present

## 2017-12-15 DIAGNOSIS — M519 Unspecified thoracic, thoracolumbar and lumbosacral intervertebral disc disorder: Secondary | ICD-10-CM | POA: Diagnosis not present

## 2017-12-15 DIAGNOSIS — H3530 Unspecified macular degeneration: Secondary | ICD-10-CM | POA: Diagnosis not present

## 2017-12-18 DIAGNOSIS — K869 Disease of pancreas, unspecified: Secondary | ICD-10-CM | POA: Diagnosis not present

## 2017-12-18 DIAGNOSIS — Z7901 Long term (current) use of anticoagulants: Secondary | ICD-10-CM | POA: Diagnosis not present

## 2017-12-18 DIAGNOSIS — E43 Unspecified severe protein-calorie malnutrition: Secondary | ICD-10-CM | POA: Diagnosis not present

## 2017-12-18 DIAGNOSIS — Z9181 History of falling: Secondary | ICD-10-CM | POA: Diagnosis not present

## 2017-12-18 DIAGNOSIS — M519 Unspecified thoracic, thoracolumbar and lumbosacral intervertebral disc disorder: Secondary | ICD-10-CM | POA: Diagnosis not present

## 2017-12-18 DIAGNOSIS — M797 Fibromyalgia: Secondary | ICD-10-CM | POA: Diagnosis not present

## 2017-12-18 DIAGNOSIS — M8000XD Age-related osteoporosis with current pathological fracture, unspecified site, subsequent encounter for fracture with routine healing: Secondary | ICD-10-CM | POA: Diagnosis not present

## 2017-12-18 DIAGNOSIS — M1990 Unspecified osteoarthritis, unspecified site: Secondary | ICD-10-CM | POA: Diagnosis not present

## 2017-12-18 DIAGNOSIS — S72141D Displaced intertrochanteric fracture of right femur, subsequent encounter for closed fracture with routine healing: Secondary | ICD-10-CM | POA: Diagnosis not present

## 2017-12-18 DIAGNOSIS — M8008XD Age-related osteoporosis with current pathological fracture, vertebra(e), subsequent encounter for fracture with routine healing: Secondary | ICD-10-CM | POA: Diagnosis not present

## 2017-12-18 DIAGNOSIS — M7501 Adhesive capsulitis of right shoulder: Secondary | ICD-10-CM | POA: Diagnosis not present

## 2017-12-18 DIAGNOSIS — J449 Chronic obstructive pulmonary disease, unspecified: Secondary | ICD-10-CM | POA: Diagnosis not present

## 2017-12-18 DIAGNOSIS — H3530 Unspecified macular degeneration: Secondary | ICD-10-CM | POA: Diagnosis not present

## 2017-12-18 DIAGNOSIS — I739 Peripheral vascular disease, unspecified: Secondary | ICD-10-CM | POA: Diagnosis not present

## 2017-12-18 DIAGNOSIS — H5462 Unqualified visual loss, left eye, normal vision right eye: Secondary | ICD-10-CM | POA: Diagnosis not present

## 2017-12-18 DIAGNOSIS — C220 Liver cell carcinoma: Secondary | ICD-10-CM | POA: Diagnosis not present

## 2017-12-18 DIAGNOSIS — Z7951 Long term (current) use of inhaled steroids: Secondary | ICD-10-CM | POA: Diagnosis not present

## 2017-12-18 DIAGNOSIS — M8580 Other specified disorders of bone density and structure, unspecified site: Secondary | ICD-10-CM | POA: Diagnosis not present

## 2017-12-19 DIAGNOSIS — S72141D Displaced intertrochanteric fracture of right femur, subsequent encounter for closed fracture with routine healing: Secondary | ICD-10-CM | POA: Diagnosis not present

## 2017-12-19 DIAGNOSIS — Z7409 Other reduced mobility: Secondary | ICD-10-CM | POA: Diagnosis not present

## 2017-12-22 ENCOUNTER — Telehealth: Payer: Self-pay

## 2017-12-22 DIAGNOSIS — C220 Liver cell carcinoma: Secondary | ICD-10-CM | POA: Diagnosis not present

## 2017-12-22 DIAGNOSIS — M7501 Adhesive capsulitis of right shoulder: Secondary | ICD-10-CM | POA: Diagnosis not present

## 2017-12-22 DIAGNOSIS — I739 Peripheral vascular disease, unspecified: Secondary | ICD-10-CM | POA: Diagnosis not present

## 2017-12-22 DIAGNOSIS — M1990 Unspecified osteoarthritis, unspecified site: Secondary | ICD-10-CM | POA: Diagnosis not present

## 2017-12-22 DIAGNOSIS — S72141D Displaced intertrochanteric fracture of right femur, subsequent encounter for closed fracture with routine healing: Secondary | ICD-10-CM | POA: Diagnosis not present

## 2017-12-22 DIAGNOSIS — M519 Unspecified thoracic, thoracolumbar and lumbosacral intervertebral disc disorder: Secondary | ICD-10-CM | POA: Diagnosis not present

## 2017-12-22 DIAGNOSIS — M8008XD Age-related osteoporosis with current pathological fracture, vertebra(e), subsequent encounter for fracture with routine healing: Secondary | ICD-10-CM | POA: Diagnosis not present

## 2017-12-22 DIAGNOSIS — K869 Disease of pancreas, unspecified: Secondary | ICD-10-CM | POA: Diagnosis not present

## 2017-12-22 DIAGNOSIS — Z7951 Long term (current) use of inhaled steroids: Secondary | ICD-10-CM | POA: Diagnosis not present

## 2017-12-22 DIAGNOSIS — Z9181 History of falling: Secondary | ICD-10-CM | POA: Diagnosis not present

## 2017-12-22 DIAGNOSIS — M8580 Other specified disorders of bone density and structure, unspecified site: Secondary | ICD-10-CM | POA: Diagnosis not present

## 2017-12-22 DIAGNOSIS — E43 Unspecified severe protein-calorie malnutrition: Secondary | ICD-10-CM | POA: Diagnosis not present

## 2017-12-22 DIAGNOSIS — H3530 Unspecified macular degeneration: Secondary | ICD-10-CM | POA: Diagnosis not present

## 2017-12-22 DIAGNOSIS — M8000XD Age-related osteoporosis with current pathological fracture, unspecified site, subsequent encounter for fracture with routine healing: Secondary | ICD-10-CM | POA: Diagnosis not present

## 2017-12-22 DIAGNOSIS — J449 Chronic obstructive pulmonary disease, unspecified: Secondary | ICD-10-CM | POA: Diagnosis not present

## 2017-12-22 DIAGNOSIS — M797 Fibromyalgia: Secondary | ICD-10-CM | POA: Diagnosis not present

## 2017-12-22 DIAGNOSIS — Z7901 Long term (current) use of anticoagulants: Secondary | ICD-10-CM | POA: Diagnosis not present

## 2017-12-22 DIAGNOSIS — H5462 Unqualified visual loss, left eye, normal vision right eye: Secondary | ICD-10-CM | POA: Diagnosis not present

## 2017-12-22 NOTE — Telephone Encounter (Signed)
Attempted to reach patient to review medications (request by [facility]) from hospital discharge. No answer.

## 2017-12-27 ENCOUNTER — Other Ambulatory Visit: Payer: Self-pay | Admitting: Chiropractic Medicine

## 2017-12-27 DIAGNOSIS — R109 Unspecified abdominal pain: Secondary | ICD-10-CM

## 2017-12-28 ENCOUNTER — Ambulatory Visit
Admission: RE | Admit: 2017-12-28 | Discharge: 2017-12-28 | Disposition: A | Discharge status: Home / Self Care | Payer: Medicare Other | Source: Ambulatory Visit | Attending: Chiropractic Medicine | Admitting: Chiropractic Medicine

## 2017-12-28 DIAGNOSIS — R109 Unspecified abdominal pain: Secondary | ICD-10-CM

## 2017-12-28 DIAGNOSIS — K5900 Constipation, unspecified: Secondary | ICD-10-CM | POA: Diagnosis not present

## 2018-01-11 ENCOUNTER — Emergency Department (HOSPITAL_COMMUNITY)
Admission: EM | Admit: 2018-01-11 | Discharge: 2018-01-11 | Disposition: A | Discharge status: Home / Self Care | Payer: Medicare Other | Attending: Emergency Medicine | Admitting: Emergency Medicine

## 2018-01-11 ENCOUNTER — Emergency Department (HOSPITAL_COMMUNITY): Payer: Medicare Other

## 2018-01-11 ENCOUNTER — Encounter (HOSPITAL_COMMUNITY): Payer: Medicare Other

## 2018-01-11 ENCOUNTER — Ambulatory Visit: Payer: Medicare Other | Admitting: Family

## 2018-01-11 DIAGNOSIS — Z79899 Other long term (current) drug therapy: Secondary | ICD-10-CM | POA: Insufficient documentation

## 2018-01-11 DIAGNOSIS — R109 Unspecified abdominal pain: Secondary | ICD-10-CM | POA: Diagnosis not present

## 2018-01-11 DIAGNOSIS — M546 Pain in thoracic spine: Secondary | ICD-10-CM | POA: Diagnosis not present

## 2018-01-11 DIAGNOSIS — J449 Chronic obstructive pulmonary disease, unspecified: Secondary | ICD-10-CM | POA: Insufficient documentation

## 2018-01-11 DIAGNOSIS — F17210 Nicotine dependence, cigarettes, uncomplicated: Secondary | ICD-10-CM | POA: Insufficient documentation

## 2018-01-11 DIAGNOSIS — K5900 Constipation, unspecified: Secondary | ICD-10-CM | POA: Diagnosis not present

## 2018-01-11 DIAGNOSIS — M545 Low back pain: Secondary | ICD-10-CM | POA: Diagnosis not present

## 2018-01-11 DIAGNOSIS — R0989 Other specified symptoms and signs involving the circulatory and respiratory systems: Secondary | ICD-10-CM | POA: Diagnosis not present

## 2018-01-11 LAB — LIPASE, BLOOD: Lipase: 18 U/L (ref 11–51)

## 2018-01-11 LAB — COMPREHENSIVE METABOLIC PANEL
ALT: 15 U/L (ref 0–44)
AST: 24 U/L (ref 15–41)
Albumin: 3.9 g/dL (ref 3.5–5.0)
Alkaline Phosphatase: 143 U/L — ABNORMAL HIGH (ref 38–126)
Anion gap: 8 (ref 5–15)
BUN: 12 mg/dL (ref 8–23)
CO2: 32 mmol/L (ref 22–32)
Calcium: 8.7 mg/dL — ABNORMAL LOW (ref 8.9–10.3)
Chloride: 91 mmol/L — ABNORMAL LOW (ref 98–111)
Creatinine, Ser: 0.5 mg/dL (ref 0.44–1.00)
GFR calc Af Amer: >60 mL/min (ref >60)
GFR calc non Af Amer: >60 mL/min (ref >60)
Glucose, Bld: 91 mg/dL (ref 70–99)
Potassium: 4.3 mmol/L (ref 3.5–5.1)
Sodium: 131 mmol/L — ABNORMAL LOW (ref 135–145)
Total Bilirubin: 1 mg/dL (ref 0.3–1.2)
Total Protein: 6.2 g/dL — ABNORMAL LOW (ref 6.5–8.1)

## 2018-01-11 LAB — CBC
HCT: 46 % (ref 36.0–46.0)
Hemoglobin: 14.5 g/dL (ref 12.0–15.0)
MCH: 30.2 pg (ref 26.0–34.0)
MCHC: 31.5 g/dL (ref 30.0–36.0)
MCV: 95.8 fL (ref 80.0–100.0)
Platelets: 221 10*3/uL (ref 150–400)
RBC: 4.8 MIL/uL (ref 3.87–5.11)
RDW: 13.9 % (ref 11.5–15.5)
WBC: 9.5 10*3/uL (ref 4.0–10.5)
nRBC: 0 % (ref 0.0–0.2)

## 2018-01-11 LAB — URINALYSIS, ROUTINE W REFLEX MICROSCOPIC
Bilirubin Urine: NEGATIVE
Glucose, UA: NEGATIVE mg/dL
Hgb urine dipstick: NEGATIVE
Ketones, ur: NEGATIVE mg/dL
Nitrite: NEGATIVE
Protein, ur: NEGATIVE mg/dL
Specific Gravity, Urine: 1.009 (ref 1.005–1.030)
pH: 5 (ref 5.0–8.0)

## 2018-01-11 MED ORDER — CEPHALEXIN 500 MG PO CAPS: 500.0000 mg | ORAL_CAPSULE | Freq: Two times a day (BID) | ORAL | 0 refills | Status: DC

## 2018-01-11 MED ORDER — HYDROMORPHONE HCL 1 MG/ML IJ SOLN
0.5000 mg | INTRAMUSCULAR | Status: DC | PRN
  Administered 2018-01-11: 0.5 mg via INTRAVENOUS

## 2018-01-11 MED ORDER — HYDROMORPHONE HCL 1 MG/ML IJ SOLN
1.0000 mg | Freq: Once | INTRAMUSCULAR | Status: DC
  Filled 2018-01-11: qty 1

## 2018-01-11 MED ORDER — CEPHALEXIN 500 MG PO CAPS
500.0000 mg | ORAL_CAPSULE | Freq: Once | ORAL | Status: DC
  Filled 2018-01-11: qty 1

## 2018-01-11 NOTE — ED Notes (Signed)
Pt has osteoporosis and is worried about fractures and takes narcotics daily for pain. She has GI issues from the narcotics and takes stool softeners and suppositories to have BM. Pt has to strain normally to have a bowel movement. Pain increases upon palpation.

## 2018-01-11 NOTE — ED Provider Notes (Signed)
[facility] DEPT Provider Note   CSN: [id number] Arrival date & time: 01/11/18  1434     History   Chief Complaint Chief Complaint  Patient presents with  . Back Pain  . Hip Pain    HPI Tina Bailey is a 72 y.o. female.  HPI Pt is having pain in her back that moves around the front that started yesterday evening.  It is a sharp pain that gets worse when she moves around.   It moves to the sides and out the front at the top of the iliac crest.  She has oxycodone that she takes for bone fractures (vertebra, and hip).  She has a pain management doctor and has been oxycodone for years.  She recently had surgery on her femur and also broke her sternum after a fall several weeks ago.  NO fever or vomiting.   Past Medical History:  Diagnosis Date  . Arthritis    DDD. Right shoulder"is frozen"-limited ROM. osteoporosis.  . Blue toe syndrome ([place]) 11/27/2014   Dr. Claudia Pollock evaluating  . Cancer ([place]) 12/2014   liver cancer  . Complication of anesthesia   . COPD (chronic obstructive pulmonary disease) ([place])   . Fibromyalgia   . Fracture of rib of right side    hx "osteoporosis"- states her dog nudge her on the side, next day developed great pain and was told has a fracture rib.  . Macular degeneration    R eye  . Osteoporosis   . PONV (postoperative nausea and vomiting)    nausea,severe vomiting after 01-13-15 portal vein embolization  . Productive cough   . Retina disorder    L eye, vision distorted, edema  . Spine fracture due to birth trauma   . TMJ disease   . Wears glasses     Patient Active Problem List   Diagnosis Date Noted  . Acute blood loss anemia 12/06/2017  . Thrombocytopenia ([place]) 12/06/2017  . Severe protein-calorie malnutrition ([place]) 12/05/2017  . Closed comminuted intertrochanteric fracture of proximal end of right femur ([place]) 12/04/2017  . Chronic thoracic back pain 03/31/2017  . Physical exam 01/09/2017  .  Malnutrition of moderate degree 12/22/2016  . Anxiety about health 09/27/2016  . Osteoporosis 07/13/2016  . Chronic narcotic use 07/13/2016  . Constipation 04/26/2015  . Chronic pain 04/26/2015  . Depression 04/26/2015  . COPD GOLD IV / still somking 03/03/2015  . Cigarette smoker 03/03/2015  . Cough 03/03/2015  . Hepatocellular carcinoma ([place]) 12/30/2014  . Pancreatic mass 12/04/2014  . Blue toe syndrome ([place]) 11/27/2014    Past Surgical History:  Procedure Laterality Date  . ANGIOPLASTY  2008   no stents required, no follow-up with cardiologist, no recurrent chest pain  . CATARACT EXTRACTION, BILATERAL Bilateral   . ESOPHAGOGASTRODUODENOSCOPY (EGD) WITH PROPOFOL N/A 06/25/2015   Procedure: ESOPHAGOGASTRODUODENOSCOPY (EGD) WITH PROPOFOL;  Surgeon: Milus Banister, MD;  Location: [facility] ENDOSCOPY;  Service: Endoscopy;  Laterality: N/A;  stent removal   . EYE SURGERY     cornea surgery  . INTRAMEDULLARY (IM) NAIL INTERTROCHANTERIC Right 12/04/2017   Procedure: INTRAMEDULLARY (IM) NAIL INTERTROCHANTRIC;  Surgeon: Rod Can, MD;  Location: [facility] ORS;  Service: Orthopedics;  Laterality: Right;  . IR RADIOLOGIST EVAL & MGMT  03/29/2017  . KIDNEY STONE SURGERY    . LAPAROSCOPIC PARTIAL HEPATECTOMY N/A 03/04/2015   Procedure: DIAGNOSTIC LAPAROSCOPY, EXTENDED RIGHT HEPATECTOMY, WITH INTRAOPERATIVE ULTRASOUND;  Surgeon: Stark Klein, MD;  Location: [facility] ORS;  Service: General;  Laterality: N/A;  .  PARTIAL HYSTERECTOMY    . portal vein embolization     01-13-15 -Dr. Cathlean Sauer.     OB History    Gravida  3   Para      Term      Preterm      AB      Living  3     SAB      TAB      Ectopic      Multiple      Live Births               Home Medications    Prior to Admission medications   Medication Sig Start Date End Date Taking? Authorizing Provider  bisacodyl (DULCOLAX) 10 MG suppository Place 1 suppository (10 mg total) rectally daily as needed for moderate  constipation. 12/06/17  Yes Debbe Odea, MD  buPROPion (WELLBUTRIN XL) 150 MG 24 hr tablet Take 150 mg by mouth daily.  01/11/18  Yes [provider]  Calcium Carbonate-Vitamin D (CALCIUM 500+D HIGH POTENCY PO) Take 1 tablet by mouth daily.    Yes [provider]  Cholecalciferol (VITAMIN D3) 5000 units CAPS Take 5,000 Units by mouth daily.   Yes [provider]  Cyanocobalamin (VITAMIN B-12) 1000 MCG SUBL Place 1,000 mcg under the tongue daily.    Yes [provider]  cyclobenzaprine (FLEXERIL) 10 MG tablet Take 10 mg by mouth 3 (three) times daily as needed for muscle spasms.   Yes [provider]  docusate sodium (COLACE) 100 MG capsule Take 1 capsule (100 mg total) by mouth 2 (two) times daily. 12/06/17  Yes Debbe Odea, MD  feeding supplement, ENSURE ENLIVE, (ENSURE ENLIVE) LIQD Take 237 mLs by mouth 2 (two) times daily between meals. 12/06/17  Yes Debbe Odea, MD  Multiple Vitamins-Minerals (CENTRUM SILVER ULTRA WOMENS PO) Take 1 tablet by mouth daily.    Yes [provider]  naproxen sodium (ALEVE) 220 MG tablet Take 440 mg by mouth 3 (three) times daily as needed (pain).    Yes [provider]  oxyCODONE (OXY IR/ROXICODONE) 5 MG immediate release tablet Take 5 mg by mouth every 4 (four) hours as needed for moderate pain or severe pain.  01/06/18  Yes [provider]  RELISTOR 150 MG TABS Take 3 tablets by mouth daily. 01/04/18  Yes [provider]  apixaban (ELIQUIS) 2.5 MG TABS tablet Take 1 tablet (2.5 mg total) by mouth 2 (two) times daily. Patient not taking: Reported on 01/11/2018 12/06/17   Debbe Odea, MD  Carboxymethylcellul-Glycerin (CLEAR EYES FOR DRY EYES) 1-0.25 % SOLN Place 1-2 drops into both eyes daily as needed (dry eyes).     [provider]  cephALEXin (KEFLEX) 500 MG capsule Take 1 capsule (500 mg total) by mouth 2 (two) times daily. 01/11/18   Dorie Rank, MD  EVENITY 105 MG/1.17ML  SOSY  12/11/17   [provider]  Glycopyrrolate-Formoterol (BEVESPI AEROSPHERE) 9-4.8 MCG/ACT AERO Inhale 2 puffs into the lungs 2 (two) times daily. Patient taking differently: Inhale 2 puffs into the lungs daily as needed (shortness of breath).  05/30/17   Tanda Rockers, MD  ipratropium-albuterol (DUONEB) 0.5-2.5 (3) MG/3ML SOLN Take 3 mLs by nebulization every 4 (four) hours as needed (SOB).     [provider]  oxyCODONE 10 MG TABS Take 1 tablet (10 mg total) by mouth every 6 (six) hours as needed for severe pain (if norco not effective). Patient not taking: Reported on 01/11/2018  12/05/17   Swinteck, Aaron Edelman, MD  polyethylene glycol (MIRALAX / GLYCOLAX) packet Take 17 g by mouth daily. Patient not taking: Reported on 01/11/2018 12/06/17   Debbe Odea, MD    Family History Family History  Problem Relation Age of Onset  . Hypertension Mother   . Atrial fibrillation Mother   . Heart disease Mother        after age 64  . Stroke Father   . Dementia Father   . Heart disease Brother        After age 7- A-Fib  . Heart attack Brother     Social History Social History   Tobacco Use  . Smoking status: Current Every Day Smoker    Packs/day: 0.50    Years: 45.00    Pack years: 22.50    Types: Cigarettes  . Smokeless tobacco: Never Used  Substance Use Topics  . Alcohol use: No    Alcohol/week: 0.0 standard drinks  . Drug use: No     Allergies   Sertraline hcl; Amoxicillin-pot clavulanate; Clindamycin/lincomycin; Paroxetine hcl; and Sulfa antibiotics   Review of Systems Review of Systems  Respiratory: Negative for chest tightness and shortness of breath.   Cardiovascular: Negative for chest pain.  Genitourinary: Negative for dysuria.  All other systems reviewed and are negative.    Physical Exam Updated Vital Signs BP 103/71   Pulse 92   Temp 98.1 F (36.7 C) (Oral)   Resp 16   SpO2 91%   Physical Exam  Constitutional: No distress.  Thin frail    HENT:  Head: Normocephalic and atraumatic.  Right Ear: External ear normal.  Left Ear: External ear normal.  Eyes: Conjunctivae are normal. Right eye exhibits no discharge. Left eye exhibits no discharge. No scleral icterus.  Neck: Neck supple. No tracheal deviation present.  Cardiovascular: Normal rate, regular rhythm and intact distal pulses.  Pulmonary/Chest: Effort normal and breath sounds normal. No stridor. No respiratory distress. She has no wheezes. She has no rales.  ttp lateral inferior posterior left ribs  Abdominal: Soft. Bowel sounds are normal. She exhibits no distension. There is tenderness in the epigastric area. There is no rebound and no guarding.  Musculoskeletal: She exhibits no edema or tenderness.  No pain with range of motion, hips  Neurological: She is alert. She has normal strength. No cranial nerve deficit (no facial droop, extraocular movements intact, no slurred speech) or sensory deficit. She exhibits normal muscle tone. She displays no seizure activity. Coordination normal.  Skin: Skin is warm and dry. No rash noted. She is not diaphoretic.  Psychiatric: She has a normal mood and affect.  Nursing note and vitals reviewed.    ED Treatments / Results  Labs (all labs ordered are listed, but only abnormal results are displayed) Labs Reviewed  URINALYSIS, ROUTINE W REFLEX MICROSCOPIC - Abnormal; Notable for the following components:      Result Value   Leukocytes, UA SMALL (*)    Bacteria, UA RARE (*)    All other components within normal limits  COMPREHENSIVE METABOLIC PANEL - Abnormal; Notable for the following components:   Sodium 131 (*)    Chloride 91 (*)    Calcium 8.7 (*)    Total Protein 6.2 (*)    Alkaline Phosphatase 143 (*)    All other components within normal limits  CBC  LIPASE, BLOOD    EKG None  Radiology Dg Chest 2 View  Result Date: 01/11/2018 CLINICAL DATA:  Mid back and bilateral hip  pain. EXAM: CHEST - 2 VIEW COMPARISON:   Multiple chest x-rays since November 2017 FINDINGS: The thoracic aorta is again prominent tortuous but similar since November 2017. No pneumothorax. No change in the cardiomediastinal silhouette, not lungs, or pleura. Multilevel compression fractures in the thoracic spine. No other acute changes. IMPRESSION: 1. Chronically prominent thoracic aorta. Recommend clinical correlation. 2. Multiple thoracic spine compression fractures. While some are stable, at least 1 is new since October 2018. Recommend clinical correlation. Electronically Signed   By: Dorise Bullion III M.D   On: 01/11/2018 17:56   Dg Lumbar Spine Complete  Result Date: 01/11/2018 CLINICAL DATA:  72 y/o F; mid back pain and bilateral hip pain since yesterday. EXAM: LUMBAR SPINE - COMPLETE 4+ VIEW COMPARISON:  Ten 19 CT abdomen and pelvis. 08/19/2015 thoracic spine radiographs. FINDINGS: Stable L1 inferior endplate deformity. Stable mild T11, mild L2, moderate L3, and moderate L4 compression deformities. Straightening of lumbar lordosis without listhesis. Bones are demineralized. No acute fracture identified. Postsurgical changes within the abdomen. IMPRESSION: 1. No acute fracture or dislocation identified. 2. Stable chronic compression deformities of lower thoracic and lumbar spine. Electronically Signed   By: Kristine Garbe M.D.   On: 01/11/2018 17:57   Ct Renal Stone Study  Result Date: 01/11/2018 CLINICAL DATA:  Abdominal pain and constipation. EXAM: CT ABDOMEN AND PELVIS WITHOUT CONTRAST TECHNIQUE: Multidetector CT imaging of the abdomen and pelvis was performed following the standard protocol without IV contrast. COMPARISON:  CT scan 12/28/2017 FINDINGS: Lower chest: The lung bases are clear of acute process. No pleural effusion or pulmonary lesions. The heart is normal in size. No pericardial effusion. The distal esophagus and aorta are unremarkable. Hepatobiliary: No focal hepatic lesions without contrast. Stable common bile  duct dilatation. Pancreas: Grossly normal without contrast. Spleen: Normal size.  No focal lesions. Adrenals/Urinary Tract: The adrenal glands and kidneys are stable. Left renal calculi noted. No hydronephrosis or obstructing ureteral calculi. No bladder calculi or mass. Stomach/Bowel: The stomach, duodenum, small bowel and colon are grossly normal without oral contrast. No obvious acute inflammatory changes, mass lesions or obstructive findings. Moderate stool in the right colon. Vascular/Lymphatic: Stable advanced atherosclerotic calcifications involving the aorta and iliac arteries. No mesenteric or retroperitoneal mass or adenopathy. Reproductive: Surgically absent. Other: No free pelvic fluid collections. No inguinal mass or adenopathy. Musculoskeletal: Osteoporosis appears stable. Stable lumbar compression deformities. No acute findings. The right hip demonstrates fracture fixation hardware. The dynamic hip screw appears to be breaching the cortex of the femoral head. IMPRESSION: 1. Unremarkable and unchanged CT appearance of the abdomen/pelvis without contrast. 2. Stable lumbar compression fractures. Electronically Signed   By: Marijo Sanes M.D.   On: 01/11/2018 17:57    Procedures Procedures (including critical care time)  Medications Ordered in ED Medications  HYDROmorphone (DILAUDID) injection 0.5 mg (0.5 mg Intravenous Given 01/11/18 1632)  cephALEXin (KEFLEX) capsule 500 mg (has no administration in time range)     Initial Impression / Assessment and Plan / ED Course  I have reviewed the triage vital signs and the nursing notes.  Pertinent labs & imaging results that were available during my care of the patient were reviewed by me and considered in my medical decision making (see chart for details).   Patient's CBC is normal.  Lipase is normal.  Electrolyte panel shows a mild decrease in sodium but not significantly changed compared to previous values.  Urinalysis does suggest a  possible UTI.  X-rays do show old compression fractures.  Is possible she has a new thoracic compression fracture since 2018.  CT of her abdomen pelvis does not show any acute vascular emergency or or other intra-abdominal cause for her pain.  I suspect her symptoms are likely related to her compression fractures.  She was treated with pain medications with some improvement.  I will give her prescription for Keflex for the possible UTI.  Recommend follow-up with her primary and orthopedic doctors..  Final Clinical Impressions(s) / ED Diagnoses   Final diagnoses:  Acute thoracic back pain, unspecified back pain laterality    ED Discharge Orders         Ordered    cephALEXin (KEFLEX) 500 MG capsule  2 times daily     01/11/18 1909           Dorie Rank, MD 01/11/18 [place]

## 2018-01-11 NOTE — ED Triage Notes (Signed)
Pt complains of mid back pain and bilateral hip pain since yesterday. Pt denies injury. Pt took 5mg  oxycodone at 1330 today. Pt denies urinary symptoms.

## 2018-01-11 NOTE — ED Notes (Signed)
Patient transported to X-ray 

## 2018-01-11 NOTE — ED Notes (Signed)
Pt aware that a urine sample is needed.  Pt unable at this time. 

## 2018-01-11 NOTE — Discharge Instructions (Addendum)
Follow-up with your primary care doctor back doctor, continue your current medications, take the antibiotics as prescribed

## 2018-01-15 ENCOUNTER — Telehealth: Payer: Self-pay | Admitting: Family Medicine

## 2018-01-15 ENCOUNTER — Ambulatory Visit: Payer: Self-pay

## 2018-01-15 DIAGNOSIS — C220 Liver cell carcinoma: Secondary | ICD-10-CM

## 2018-01-15 DIAGNOSIS — K869 Disease of pancreas, unspecified: Secondary | ICD-10-CM

## 2018-01-15 DIAGNOSIS — F17210 Nicotine dependence, cigarettes, uncomplicated: Secondary | ICD-10-CM

## 2018-01-15 DIAGNOSIS — H3530 Unspecified macular degeneration: Secondary | ICD-10-CM

## 2018-01-15 DIAGNOSIS — J449 Chronic obstructive pulmonary disease, unspecified: Secondary | ICD-10-CM

## 2018-01-15 DIAGNOSIS — M519 Unspecified thoracic, thoracolumbar and lumbosacral intervertebral disc disorder: Secondary | ICD-10-CM

## 2018-01-15 DIAGNOSIS — E43 Unspecified severe protein-calorie malnutrition: Secondary | ICD-10-CM

## 2018-01-15 DIAGNOSIS — M8580 Other specified disorders of bone density and structure, unspecified site: Secondary | ICD-10-CM | POA: Diagnosis not present

## 2018-01-15 DIAGNOSIS — H5462 Unqualified visual loss, left eye, normal vision right eye: Secondary | ICD-10-CM

## 2018-01-15 DIAGNOSIS — S72141D Displaced intertrochanteric fracture of right femur, subsequent encounter for closed fracture with routine healing: Secondary | ICD-10-CM

## 2018-01-15 DIAGNOSIS — F319 Bipolar disorder, unspecified: Secondary | ICD-10-CM

## 2018-01-15 DIAGNOSIS — Z7951 Long term (current) use of inhaled steroids: Secondary | ICD-10-CM

## 2018-01-15 DIAGNOSIS — F419 Anxiety disorder, unspecified: Secondary | ICD-10-CM

## 2018-01-15 DIAGNOSIS — M1990 Unspecified osteoarthritis, unspecified site: Secondary | ICD-10-CM

## 2018-01-15 DIAGNOSIS — I739 Peripheral vascular disease, unspecified: Secondary | ICD-10-CM

## 2018-01-15 DIAGNOSIS — M8000XD Age-related osteoporosis with current pathological fracture, unspecified site, subsequent encounter for fracture with routine healing: Secondary | ICD-10-CM | POA: Diagnosis not present

## 2018-01-15 DIAGNOSIS — F1190 Opioid use, unspecified, uncomplicated: Secondary | ICD-10-CM

## 2018-01-15 DIAGNOSIS — Z7901 Long term (current) use of anticoagulants: Secondary | ICD-10-CM

## 2018-01-15 DIAGNOSIS — M8008XD Age-related osteoporosis with current pathological fracture, vertebra(e), subsequent encounter for fracture with routine healing: Secondary | ICD-10-CM | POA: Diagnosis not present

## 2018-01-15 DIAGNOSIS — M797 Fibromyalgia: Secondary | ICD-10-CM

## 2018-01-15 DIAGNOSIS — M7501 Adhesive capsulitis of right shoulder: Secondary | ICD-10-CM

## 2018-01-15 DIAGNOSIS — Z9181 History of falling: Secondary | ICD-10-CM

## 2018-01-15 NOTE — Telephone Encounter (Signed)
Paperwork given to PCP for completion.  

## 2018-01-15 NOTE — Telephone Encounter (Signed)
Placed HH Cert. And plan of care in front bin with charge sheet.

## 2018-01-15 NOTE — Telephone Encounter (Signed)
Form completed and placed in basket  

## 2018-01-15 NOTE — Telephone Encounter (Signed)
Pt c/o 3 weeks history of constipation. Pt stated previously she would have a bowel movement every day to every other day.  Pt stated that she has not had a BM all weekend and a few very small BM's last week. Pt stated she has tried, suppositories, Miralax, Magnesium Citrate, Relistor and the constipation is still an issue. She stated she had a BM this am. Pt stated that it was a very small ball and then pasty. Pt stated that the constipation causes gas and abdominal distention. Pt stated that she was prescribed relistor 01/04/18. Pt stated the constipation has decreased her appetite but that is improving. Pt is on opioid therapy.  Care advice given and pt verbalized understanding. Pt given appointment tomorrow at 2:15 pm.   Reason for Disposition . Abdomen is more swollen than usual  Answer Assessment - Initial Assessment Questions 1. STOOL PATTERN OR FREQUENCY: "How often do you pass bowel movements (BMs)?"  (Normal range: tid to q 3 days)  "When was the last BM passed?"       Once every day or every other day 2. STRAINING: "Do you have to strain to have a BM?"      yes 3. RECTAL PAIN: "Does your rectum hurt when the stool comes out?" If so, ask: "Do you have hemorrhoids? How bad is the pain?"  (Scale 1-10; or mild, moderate, severe)     Yes- pt stated that she has hemorrhoids 4. STOOL COMPOSITION: "Are the stools hard?"      Hard then pasty 5. BLOOD ON STOOLS: "Has there been any blood on the toilet tissue or on the surface of the BM?" If so, ask: "When was the last time?"      no 6. CHRONIC CONSTIPATION: "Is this a new problem for you?"  If no, ask: "How long have you had this problem?" (days, weeks, months)      No- 01-04-18 7. CHANGES IN DIET: "Have there been any recent changes in your diet?"      Pt stated her appetite has decreased but has increased lately 8. MEDICATIONS: "Have you been taking any new medications?"     No Pt is on opioid therapy taking Aleve and oxycodone 9.  LAXATIVES: "Have you been using any laxatives or enemas?"  If yes, ask "What, how often, and when was the last time?"    recatl Suppositories and stool softners and Relistor, MiraLax, laxatives 10. CAUSE: "What do you think is causing the constipation?"        opioid therapy 11. OTHER SYMPTOMS: "Do you have any other symptoms?" (e.g., abdominal pain, fever, vomiting)     no 12. PREGNANCY: "Is there any chance you are pregnant?" "When was your last menstrual period?"    n/a  Protocols used: CONSTIPATION-A-AH

## 2018-01-15 NOTE — Telephone Encounter (Signed)
fyi

## 2018-01-16 ENCOUNTER — Ambulatory Visit: Payer: Medicare Other | Admitting: Family Medicine

## 2018-01-16 DIAGNOSIS — S72141D Displaced intertrochanteric fracture of right femur, subsequent encounter for closed fracture with routine healing: Secondary | ICD-10-CM | POA: Diagnosis not present

## 2018-01-16 NOTE — Telephone Encounter (Signed)
Picked  up and faxed  

## 2018-01-19 DIAGNOSIS — Z7409 Other reduced mobility: Secondary | ICD-10-CM | POA: Diagnosis not present

## 2018-01-22 DIAGNOSIS — M810 Age-related osteoporosis without current pathological fracture: Secondary | ICD-10-CM | POA: Diagnosis not present

## 2018-01-23 DIAGNOSIS — M549 Dorsalgia, unspecified: Secondary | ICD-10-CM | POA: Diagnosis not present

## 2018-02-18 DIAGNOSIS — Z7409 Other reduced mobility: Secondary | ICD-10-CM | POA: Diagnosis not present

## 2018-02-20 ENCOUNTER — Encounter: Payer: Self-pay | Admitting: Family

## 2018-02-20 ENCOUNTER — Ambulatory Visit (HOSPITAL_COMMUNITY)
Admission: RE | Admit: 2018-02-20 | Discharge: 2018-02-20 | Disposition: A | Discharge status: Home / Self Care | Payer: Medicare Other | Source: Ambulatory Visit | Attending: Family | Admitting: Family

## 2018-02-20 ENCOUNTER — Ambulatory Visit (INDEPENDENT_AMBULATORY_CARE_PROVIDER_SITE_OTHER)
Admission: RE | Admit: 2018-02-20 | Discharge: 2018-02-20 | Disposition: A | Discharge status: Home / Self Care | Payer: Medicare Other | Source: Ambulatory Visit | Attending: Family | Admitting: Family

## 2018-02-20 ENCOUNTER — Ambulatory Visit (INDEPENDENT_AMBULATORY_CARE_PROVIDER_SITE_OTHER): Payer: Medicare Other | Admitting: Family

## 2018-02-20 VITALS — BP 136/75 | HR 86 | Ht 63.0 in | Wt 100.6 lb

## 2018-02-20 DIAGNOSIS — I779 Disorder of arteries and arterioles, unspecified: Secondary | ICD-10-CM

## 2018-02-20 DIAGNOSIS — F17200 Nicotine dependence, unspecified, uncomplicated: Secondary | ICD-10-CM

## 2018-02-20 DIAGNOSIS — R609 Edema, unspecified: Secondary | ICD-10-CM

## 2018-02-20 DIAGNOSIS — I75022 Atheroembolism of left lower extremity: Secondary | ICD-10-CM | POA: Diagnosis not present

## 2018-02-20 NOTE — Patient Instructions (Addendum)
To decrease swelling in your feet and legs: Elevate feet above slightly bent knees, feet above heart, overnight and 3-4 times per day for 20 minutes.     Steps to Quit Smoking Smoking tobacco can be bad for your health. It can also affect almost every organ in your body. Smoking puts you and people around you at risk for many serious long-lasting (chronic) diseases. Quitting smoking is hard, but it is one of the best things that you can do for your health. It is never too late to quit. What are the benefits of quitting smoking? When you quit smoking, you lower your risk for getting serious diseases and conditions. They can include:  Lung cancer or lung disease.  Heart disease.  Stroke.  Heart attack.  Not being able to have children (infertility).  Weak bones (osteoporosis) and broken bones (fractures).  If you have coughing, wheezing, and shortness of breath, those symptoms may get better when you quit. You may also get sick less often. If you are pregnant, quitting smoking can help to lower your chances of having a baby of low birth weight. What can I do to help me quit smoking? Talk with your doctor about what can help you quit smoking. Some things you can do (strategies) include:  Quitting smoking totally, instead of slowly cutting back how much you smoke over a period of time.  Going to in-person counseling. You are more likely to quit if you go to many counseling sessions.  Using resources and support systems, such as: ? Online chats with a counselor. ? Phone quitlines. ? Printed self-help materials. ? Support groups or group counseling. ? Text messaging programs. ? Mobile phone apps or applications.  Taking medicines. Some of these medicines may have nicotine in them. If you are pregnant or breastfeeding, do not take any medicines to quit smoking unless your doctor says it is okay. Talk with your doctor about counseling or other things that can help you.  Talk with  your doctor about using more than one strategy at the same time, such as taking medicines while you are also going to in-person counseling. This can help make quitting easier. What things can I do to make it easier to quit? Quitting smoking might feel very hard at first, but there is a lot that you can do to make it easier. Take these steps:  Talk to your family and friends. Ask them to support and encourage you.  Call phone quitlines, reach out to support groups, or work with a counselor.  Ask people who smoke to not smoke around you.  Avoid places that make you want (trigger) to smoke, such as: ? Bars. ? Parties. ? Smoke-break areas at work.  Spend time with people who do not smoke.  Lower the stress in your life. Stress can make you want to smoke. Try these things to help your stress: ? Getting regular exercise. ? Deep-breathing exercises. ? Yoga. ? Meditating. ? Doing a body scan. To do this, close your eyes, focus on one area of your body at a time from head to toe, and notice which parts of your body are tense. Try to relax the muscles in those areas.  Download or buy apps on your mobile phone or tablet that can help you stick to your quit plan. There are many free apps, such as QuitGuide from the CDC (Centers for Disease Control and Prevention). You can find more support from smokefree.gov and other websites.  This information is   not intended to replace advice given to you by your health care provider. Make sure you discuss any questions you have with your health care provider. Document Released: 01/01/2009 Document Revised: 11/03/2015 Document Reviewed: 07/22/2014 Elsevier Interactive Patient Education  2018 Elsevier Inc.     Peripheral Vascular Disease Peripheral vascular disease (PVD) is a disease of the blood vessels that are not part of your heart and brain. A simple term for PVD is poor circulation. In most cases, PVD narrows the blood vessels that carry blood from  your heart to the rest of your body. This can result in a decreased supply of blood to your arms, legs, and internal organs, like your stomach or kidneys. However, it most often affects a person's lower legs and feet. There are two types of PVD.  Organic PVD. This is the more common type. It is caused by damage to the structure of blood vessels.  Functional PVD. This is caused by conditions that make blood vessels contract and tighten (spasm).  Without treatment, PVD tends to get worse over time. PVD can also lead to acute ischemic limb. This is when an arm or limb suddenly has trouble getting enough blood. This is a medical emergency. Follow these instructions at home:  Take medicines only as told by your doctor.  Do not use any tobacco products, including cigarettes, chewing tobacco, or electronic cigarettes. If you need help quitting, ask your doctor.  Lose weight if you are overweight, and maintain a healthy weight as told by your doctor.  Eat a diet that is low in fat and cholesterol. If you need help, ask your doctor.  Exercise regularly. Ask your doctor for some good activities for you.  Take good care of your feet. ? Wear comfortable shoes that fit well. ? Check your feet often for any cuts or sores. Contact a doctor if:  You have cramps in your legs while walking.  You have leg pain when you are at rest.  You have coldness in a leg or foot.  Your skin changes.  You are unable to get or have an erection (erectile dysfunction).  You have cuts or sores on your feet that are not healing. Get help right away if:  Your arm or leg turns cold and blue.  Your arms or legs become red, warm, swollen, painful, or numb.  You have chest pain or trouble breathing.  You suddenly have weakness in your face, arm, or leg.  You become very confused or you cannot speak.  You suddenly have a very bad headache.  You suddenly cannot see. This information is not intended to  replace advice given to you by your health care provider. Make sure you discuss any questions you have with your health care provider. Document Released: 06/01/2009 Document Revised: 08/13/2015 Document Reviewed: 08/15/2013 Elsevier Interactive Patient Education  2017 Elsevier Inc.  

## 2018-02-20 NOTE — Progress Notes (Signed)
[facility] [place]   CC: Follow up peripheral artery occlusive disease  History of Present Illness LINDER PRAJAPATI is a 72 y.o. female whom Dr. Oneida Alar has evaluated for pain and bluish discoloration left fifth toe. The patient began to have pain in her left fifth toe about June of 2016. Toe was hurting primarily in the morning time. She denies claudication symptoms. She denies any prior episodes. She denies family history of abdominal aortic aneurysm. She denies history of diabetes. Other medical problems include COPD, arthritis, coronary artery disease.  Dr. Oneida Alar last saw pt on 12/04/14. At that time CT angiogram of the abdomen and pelvis with runoff was reviewed. There was a 50% stenosis of the left superficial femoral artery. Otherwise her arterial tree is without any significant pathology. However, a mass was detected in the central aspect of her liver. This was approximate 5-6 cm in diameter. A follow-up MRI of the abdomen was performed which suggests that this mass may be malignant. There were also several masses noted in the tail of the pancreas. Improved left fifth toe. In light of the fact that the plaque in her left SFA seemed fairly benign Dr. Oneida Alar believes the best option at this point would be dual antiplatelets therapy with Plavix and aspirin for 6 months. If her symptoms are overall stable at the end of 6 months we could switch back to just aspirin alone. Dr. Oneida Alar prescriped Plavix but told her not to fill this until after she is seen by the oncology doctors as she will most likely need a biopsy of her liver. After the workup of her liver/pancreas mass is concluded she could start her Plavix at that point. If the patient has an additional embolic event she may need to be considered for left superficial femoral artery stenting.  She had liver resection in December of 2016, pt states half of her liver was removed. Her stamina has been decreased from this  and does not walk much.  She denies claudication type sx's with walking. She states the blue color in her left 5th toe has resolved.  She reports she had several fractures, diagnosed with osteoporosis, took Forteo, stopped March 2018.   Her walking is limited by dyspnea and fatigue, difficulty standing up.   She is under pain management care for vertebral compression fractures related pain. She has had chronic pain since her 71's with fibromyalgia. She has been using a rolling walker with a seat, and is able to walk more.  Pt denies any hx of stroke or TIA.  Diabetic: No Tobacco use: smoker (2 cigs/day, decreased from 1-2 ppd, started at age 32 yrs)  Pt meds include: Statin :No Betablocker: No ASA: no, pt states she is not taking Other anticoagulants/antiplatelets: no, pt states her oncologist and surgeon advised her not to take Plavix     Past Medical History:  Diagnosis Date  . Arthritis    DDD. Right shoulder"is frozen"-limited ROM. osteoporosis.  . Blue toe syndrome ([place]) 11/27/2014   Dr. Claudia Pollock evaluating  . Cancer ([place]) 12/2014   liver cancer  . Complication of anesthesia   . COPD (chronic obstructive pulmonary disease) ([place])   . Fibromyalgia   . Fracture of rib of right side    hx "osteoporosis"- states her dog nudge her on the side, next day developed great pain and was told has a fracture rib.  . Macular degeneration    R eye  . Osteoporosis   . PONV (postoperative nausea  and vomiting)    nausea,severe vomiting after 01-13-15 portal vein embolization  . Productive cough   . Retina disorder    L eye, vision distorted, edema  . Spine fracture due to birth trauma   . TMJ disease   . Wears glasses     Social History Social History   Tobacco Use  . Smoking status: Current Every Day Smoker    Packs/day: 0.50    Years: 45.00    Pack years: 22.50    Types: Cigarettes  . Smokeless tobacco: Never Used  Substance Use Topics  . Alcohol use: No     Alcohol/week: 0.0 standard drinks  . Drug use: No    Family History Family History  Problem Relation Age of Onset  . Hypertension Mother   . Atrial fibrillation Mother   . Heart disease Mother        after age 85  . Stroke Father   . Dementia Father   . Heart disease Brother        After age 3- A-Fib  . Heart attack Brother     Past Surgical History:  Procedure Laterality Date  . ANGIOPLASTY  2008   no stents required, no follow-up with cardiologist, no recurrent chest pain  . CATARACT EXTRACTION, BILATERAL Bilateral   . ESOPHAGOGASTRODUODENOSCOPY (EGD) WITH PROPOFOL N/A 06/25/2015   Procedure: ESOPHAGOGASTRODUODENOSCOPY (EGD) WITH PROPOFOL;  Surgeon: Milus Banister, MD;  Location: [facility];  Service: Endoscopy;  Laterality: N/A;  stent removal   . EYE SURGERY     cornea surgery  . INTRAMEDULLARY (IM) NAIL INTERTROCHANTERIC Right 12/04/2017   Procedure: INTRAMEDULLARY (IM) NAIL INTERTROCHANTRIC;  Surgeon: Rod Can, MD;  Location: [facility];  Service: Orthopedics;  Laterality: Right;  . IR RADIOLOGIST EVAL & MGMT  03/29/2017  . KIDNEY STONE SURGERY    . LAPAROSCOPIC PARTIAL HEPATECTOMY N/A 03/04/2015   Procedure: DIAGNOSTIC LAPAROSCOPY, EXTENDED RIGHT HEPATECTOMY, WITH INTRAOPERATIVE ULTRASOUND;  Surgeon: Stark Klein, MD;  Location: [facility];  Service: General;  Laterality: N/A;  . PARTIAL HYSTERECTOMY    . portal vein embolization     01-13-15 -Dr. Cathlean Sauer.    Allergies  Allergen Reactions  . Sertraline Hcl     Zoloft, had a terrible reaction  . Amoxicillin-Pot Clavulanate Nausea Only    Has patient had a PCN reaction causing immediate rash, facial/tongue/throat swelling, SOB or lightheadedness with hypotension: No Has patient had a PCN reaction causing severe rash involving mucus membranes or skin necrosis: No Has patient had a PCN reaction that required hospitalization No Has patient had a PCN reaction occurring within the last 10 years: No If all of  the above answers are "NO", then may proceed with Cephalosporin use.   . Clindamycin/Lincomycin Other (See Comments)    Felt like it "burned out" her stomach/took a large dose  . Paroxetine Hcl Rash  . Sulfa Antibiotics Rash    Current Outpatient Medications  Medication Sig Dispense Refill  . bisacodyl (DULCOLAX) 10 MG suppository Place 1 suppository (10 mg total) rectally daily as needed for moderate constipation. 12 suppository 0  . buPROPion (WELLBUTRIN XL) 150 MG 24 hr tablet Take 150 mg by mouth daily.     . Calcium Carbonate-Vitamin D (CALCIUM 500+D HIGH POTENCY PO) Take 1 tablet by mouth daily.     . Cholecalciferol (VITAMIN D3) 5000 units CAPS Take 5,000 Units by mouth daily.    Marland Kitchen EVENITY 105 MG/1.17ML SOSY     . feeding supplement, ENSURE ENLIVE, (ENSURE  ENLIVE) LIQD Take 237 mLs by mouth 2 (two) times daily between meals. 237 mL 12  . Glycopyrrolate-Formoterol (BEVESPI AEROSPHERE) 9-4.8 MCG/ACT AERO Inhale 2 puffs into the lungs 2 (two) times daily. (Patient taking differently: Inhale 2 puffs into the lungs daily as needed (shortness of breath). ) 1 Inhaler 11  . ipratropium-albuterol (DUONEB) 0.5-2.5 (3) MG/3ML SOLN Take 3 mLs by nebulization every 4 (four) hours as needed (SOB).     . Multiple Vitamins-Minerals (CENTRUM SILVER ULTRA WOMENS PO) Take 1 tablet by mouth daily.     . naproxen sodium (ALEVE) 220 MG tablet Take 440 mg by mouth 3 (three) times daily as needed (pain).     . polyethylene glycol (MIRALAX / GLYCOLAX) packet Take 17 g by mouth daily. 14 each 0  . RELISTOR 150 MG TABS Take 3 tablets by mouth daily.  1  . vitamin B-12 (CYANOCOBALAMIN) 1000 MCG tablet Take 1,000 mcg by mouth daily.    Marland Kitchen oxyCODONE (OXY IR/ROXICODONE) 5 MG immediate release tablet Take 5 mg by mouth every 4 (four) hours as needed for moderate pain or severe pain.   0  . oxyCODONE 10 MG TABS Take 1 tablet (10 mg total) by mouth every 6 (six) hours as needed for severe pain (if norco not effective).  (Patient not taking: Reported on 01/11/2018) 50 tablet 0   No current facility-administered medications for this visit.     ROS: See HPI for pertinent positives and negatives.   Physical Examination  Vitals:   02/20/18 1608  BP: 136/75  Pulse: 86  SpO2: 99%  Weight: 100 lb 9.6 oz (45.6 kg)  Height: 5\' 3"  (1.6 m)   Respirations 22/minute  Body mass index is 17.82 kg/m.  General: A&O x 3, WDWN, thin female. Gait: slow, steady, using rolling walker Eyes: PERRLA. Pulmonary: Respirations are moderately labored at rest Cardiac: regular rhythm with occasional premature contractions, no detected murmur.    Carotid Bruits Right Left   Negative Negative   Abdominal aortic pulse is palpable. Radial pulses: 2+ palpable   VASCULAR EXAM: Extremitieswithoutischemic changes, withoutGangrene; withoutopen wounds.All toes are pink with brisk capillary refill.  Dependent edema: 2+ pitting bilaterally in lower legs and feet.   LE Pulses Right Left  FEMORAL 3+ palpable 3+ palpable   POPLITEAL notpalpable  not palpable  POSTERIOR TIBIAL not palpable  not palpable   DORSALIS PEDIS ANTERIOR TIBIAL not palpable  not palpable    Abdomen: soft,non tender, no palpable masses, well healed surgical scar. Skin: no rashes, no ulcers, no cellulitis.  Musculoskeletal: Mild genralized muscle wasting and atrophy, deconditioning.  Neurologic: A&O X 3; SENSATION: normal; MOTOR FUNCTION: moving all extremities equally, motor strength 4/5 throughout. Speech is fluent/normal. CN 2-12 intact.  Psychiatric:  anxious and irritable affect.    ASSESSMENT: EULALAH RUPERT is a 72 y.o. female who has a history of spontaneously resolved blue toe syndrome of the left 5th toe. CT angiogram in September 2016 shows 50% stenosis of the left superficial femoral artery. Otherwise her arterial tree is without any  significant pathology.  Liver and pancreas masses incidentally found on this CTA resulted in resection of a malignant liver mass in December of 2016; she is trying to regain some stamina after this.  Her walking is limited by dyspnea, and by worsening chronic pain from her back, she obtained a walker, and is able to walk more. There are no signs of ischemia in her feet or legs.   Her atherosclerotic risk factors include  active smoking (50+ years), liver cancer, and COPD.  I advised pt and her daughter that pt take a daily 81 mg ASA, but defer to her oncologist, cardiologist, ad PCP.   Pt daughter states pt will not elevate her legs to help decrease dependent edema.   She is dizzy taking dilaudid for chronic back pain.and hip fx, and sternum fx since she fell in September 2019.     DATA  Right LE arterial duplex 02-20-18:  +-----------+--------+-----+--------+----------+--------+       PSV cm/sRatioStenosisWaveform Comments +-----------+--------+-----+--------+----------+--------+ CFA Distal 70          biphasic      +-----------+--------+-----+--------+----------+--------+ DFA    55          biphasic      +-----------+--------+-----+--------+----------+--------+ SFA Prox  0       occluded          +-----------+--------+-----+--------+----------+--------+ SFA Mid  0       occluded          +-----------+--------+-----+--------+----------+--------+ SFA Distal 0       occluded          +-----------+--------+-----+--------+----------+--------+ POP Prox  40          monophasic     +-----------+--------+-----+--------+----------+--------+ POP Distal 42          monophasic     +-----------+--------+-----+--------+----------+--------+ ATA Distal 29                     +-----------+--------+-----+--------+----------+--------+ PTA Distal 16          monophasic     +-----------+--------+-----+--------+----------+--------+ PERO Distal25          monophasic     +-----------+--------+-----+--------+----------+--------+  Summary: Right: Total occlusion noted in the superficial femoral artery with reconstitution in the popliteal artery.   ABI 02-20-18 ABI Findings: +---------+------------------+-----+----------+--------+ Right  Rt Pressure (mmHg)IndexWaveform Comment  +---------+------------------+-----+----------+--------+ Brachial 135                     +---------+------------------+-----+----------+--------+ PTA   92        0.68 monophasic     +---------+------------------+-----+----------+--------+ DP    92        0.68 monophasic     +---------+------------------+-----+----------+--------+ Great Toe74        0.55 Abnormal      +---------+------------------+-----+----------+--------+  +---------+------------------+-----+----------+-------+ Left   Lt Pressure (mmHg)IndexWaveform Comment +---------+------------------+-----+----------+-------+ Brachial 135                     +---------+------------------+-----+----------+-------+ PTA   113        0.84 monophasic     +---------+------------------+-----+----------+-------+ DP    112        0.83 monophasic     +---------+------------------+-----+----------+-------+ Great Toe79        0.59 Abnormal      +---------+------------------+-----+----------+-------+  +-------+-----------+-----------+------------+------------+ ABI/TBIToday's ABIToday's TBIPrevious ABIPrevious TBI +-------+-----------+-----------+------------+------------+ Right 0.68    0.55    0.66     0.28     +-------+-----------+-----------+------------+------------+ Left  0.84    0.59    0.78    0.62     +-------+-----------+-----------+------------+------------+  Right ABIs appear essentially unchanged. Left ABIs appear increased.  Summary: Right: Resting right ankle-brachial index indicates moderate right lower extremity arterial disease. The right toe-brachial index is abnormal.  Left: Resting left ankle-brachial index indicates mild left lower extremity arterial disease. The left toe-brachial index is abnormal.    CT angiogram of the abdomen and pelvis with runoff:September 2016: There is a 50%  stenosis of the left superficial femoral artery. Otherwise her arterial tree is without any significant pathology. However, a mass was detected in the central aspect of her liver. This was approximate 5-6 cm in diameter. A follow-up MRI of the abdomen was performed which suggests that this mass may be malignant. There were also several masses noted in the tail of the pancreas.   PLAN:  The patient was counseled re smoking cessation.  Graduated walking program with her rolling walker with seat, discussed and how to achieve.   Based on the patient's vascular studies and examination, pt will return to clinic in 3 monthswith ABI's and bilateral LE arterial duplex, see Dr. Oneida Alar afterward at her next visit, not NP.  I advised her to notify us if she develops concerns re the circulation in her feet or legs.   I discussed in depth with the patient the nature of atherosclerosis, and emphasized the importance of maximal medical managemnt including strict control of blood pressure, blood glucose, and lipid levels, obtaining regular exercise, and cessation of smoking.  The patient is aware that without maximal medical management the underlying atherosclerotic disease process will progress, limiting the benefit of any interventions.  The patient was given  information about PAD including signs, symptoms, treatment, what symptoms should prompt the patient to seek immediate medical care, and risk reduction measures to take.  Clemon Chambers, RN, MSN, FNP-C [facility] Phone: [phone]  Clinic MD: Bishop Dublin  02/20/18 4:58 PM

## 2018-02-21 DIAGNOSIS — M810 Age-related osteoporosis without current pathological fracture: Secondary | ICD-10-CM | POA: Diagnosis not present

## 2018-02-27 ENCOUNTER — Ambulatory Visit (INDEPENDENT_AMBULATORY_CARE_PROVIDER_SITE_OTHER): Payer: Medicare Other | Admitting: Physician Assistant

## 2018-02-27 ENCOUNTER — Other Ambulatory Visit: Payer: Self-pay

## 2018-02-27 ENCOUNTER — Encounter: Payer: Self-pay | Admitting: Physician Assistant

## 2018-02-27 VITALS — BP 132/86 | HR 89 | Temp 97.9°F | Resp 14 | Ht 63.0 in | Wt 100.0 lb

## 2018-02-27 DIAGNOSIS — R609 Edema, unspecified: Secondary | ICD-10-CM | POA: Diagnosis not present

## 2018-02-27 DIAGNOSIS — Z111 Encounter for screening for respiratory tuberculosis: Secondary | ICD-10-CM

## 2018-02-27 DIAGNOSIS — S72141D Displaced intertrochanteric fracture of right femur, subsequent encounter for closed fracture with routine healing: Secondary | ICD-10-CM | POA: Diagnosis not present

## 2018-02-27 NOTE — Progress Notes (Signed)
Patient presents to clinic today c/o 3-4 weeks of swelling of the legs bilaterally. Has slowly been worsening since onset. Denies change in diet or exercise. Is watching salt intake. Has history of protein-calorie malnourishment and remote history of [place] in remission. Denies SOB, PND or orthopnea. Denies fever, chills, malaise or fatigue. Has been wearing compression stockings.  Past Medical History:  Diagnosis Date  . Arthritis    DDD. Right shoulder"is frozen"-limited ROM. osteoporosis.  . Blue toe syndrome ([place]) 11/27/2014   Dr. Claudia Pollock evaluating  . Cancer ([place]) 12/2014   liver cancer  . Complication of anesthesia   . COPD (chronic obstructive pulmonary disease) ([place])   . Fibromyalgia   . Fracture of rib of right side    hx "osteoporosis"- states her dog nudge her on the side, next day developed great pain and was told has a fracture rib.  . Macular degeneration    R eye  . Osteoporosis   . PONV (postoperative nausea and vomiting)    nausea,severe vomiting after 01-13-15 portal vein embolization  . Productive cough   . Retina disorder    L eye, vision distorted, edema  . Spine fracture due to birth trauma   . TMJ disease   . Wears glasses     Current Outpatient Medications on File Prior to Visit  Medication Sig Dispense Refill  . bisacodyl (DULCOLAX) 10 MG suppository Place 1 suppository (10 mg total) rectally daily as needed for moderate constipation. 12 suppository 0  . buPROPion (WELLBUTRIN XL) 150 MG 24 hr tablet Take 150 mg by mouth daily.     . Calcium Carbonate-Vitamin D (CALCIUM 500+D HIGH POTENCY PO) Take 1 tablet by mouth daily.     . Cholecalciferol (VITAMIN D3) 5000 units CAPS Take 5,000 Units by mouth daily.    Marland Kitchen EVENITY 105 MG/1.17ML SOSY Inject 210 mg into the skin once.     . feeding supplement, ENSURE ENLIVE, (ENSURE ENLIVE) LIQD Take 237 mLs by mouth 2 (two) times daily between meals. 237 mL 12  . Glycopyrrolate-Formoterol (BEVESPI AEROSPHERE) 9-4.8  MCG/ACT AERO Inhale 2 puffs into the lungs 2 (two) times daily. (Patient taking differently: Inhale 2 puffs into the lungs daily as needed (shortness of breath). ) 1 Inhaler 11  . HYDROmorphone (DILAUDID) 4 MG tablet Take 4 mg by mouth every 6 (six) hours as needed for severe pain.    Marland Kitchen ipratropium-albuterol (DUONEB) 0.5-2.5 (3) MG/3ML SOLN Take 3 mLs by nebulization every 4 (four) hours as needed (SOB).     . Multiple Vitamins-Minerals (CENTRUM SILVER ULTRA WOMENS PO) Take 1 tablet by mouth daily.     . naproxen sodium (ALEVE) 220 MG tablet Take 440 mg by mouth 3 (three) times daily as needed (pain).     . polyethylene glycol (MIRALAX / GLYCOLAX) packet Take 17 g by mouth daily. 14 each 0  . promethazine (PHENERGAN) 12.5 MG tablet Take 12.5 mg by mouth every 6 (six) hours as needed for nausea or vomiting.    Marland Kitchen RELISTOR 150 MG TABS Take 3 tablets by mouth daily.  1  . vitamin B-12 (CYANOCOBALAMIN) 1000 MCG tablet Take 1,000 mcg by mouth daily.     No current facility-administered medications on file prior to visit.     Allergies  Allergen Reactions  . Sertraline Hcl     Zoloft, had a terrible reaction  . Amoxicillin-Pot Clavulanate Nausea Only    Has patient had a PCN reaction causing immediate rash, facial/tongue/throat swelling, SOB or lightheadedness  with hypotension: No Has patient had a PCN reaction causing severe rash involving mucus membranes or skin necrosis: No Has patient had a PCN reaction that required hospitalization No Has patient had a PCN reaction occurring within the last 10 years: No If all of the above answers are "NO", then may proceed with Cephalosporin use.   . Clindamycin/Lincomycin Other (See Comments)    Felt like it "burned out" her stomach/took a large dose  . Paroxetine Hcl Rash  . Sulfa Antibiotics Rash    Family History  Problem Relation Age of Onset  . Hypertension Mother   . Atrial fibrillation Mother   . Heart disease Mother        after age 70  .  Stroke Father   . Dementia Father   . Heart disease Brother        After age 71- A-Fib  . Heart attack Brother     Social History   Socioeconomic History  . Marital status: Divorced    Spouse name: Not on file  . Number of children: Not on file  . Years of education: Not on file  . Highest education level: Not on file  Occupational History  . Not on file  Social Needs  . Financial resource strain: Not on file  . Food insecurity:    Worry: Not on file    Inability: Not on file  . Transportation needs:    Medical: Not on file    Non-medical: Not on file  Tobacco Use  . Smoking status: Current Every Day Smoker    Packs/day: 0.50    Years: 45.00    Pack years: 22.50    Types: Cigarettes  . Smokeless tobacco: Never Used  Substance and Sexual Activity  . Alcohol use: No    Alcohol/week: 0.0 standard drinks  . Drug use: No  . Sexual activity: Not on file  Lifestyle  . Physical activity:    Days per week: Not on file    Minutes per session: Not on file  . Stress: Not on file  Relationships  . Social connections:    Talks on phone: Not on file    Gets together: Not on file    Attends religious service: Not on file    Active member of club or organization: Not on file    Attends meetings of clubs or organizations: Not on file    Relationship status: Not on file  Other Topics Concern  . Not on file  Social History Narrative  . Not on file   Review of Systems - See HPI.  All other ROS are negative.  BP 132/86   Pulse 89   Temp 97.9 F (36.6 C) (Oral)   Resp 14   Ht 5' 3"  (1.6 m)   Wt 100 lb (45.4 kg)   BMI 17.71 kg/m   Physical Exam  Constitutional: She is oriented to person, place, and time. She appears well-developed and well-nourished.  HENT:  Head: Normocephalic and atraumatic.  Neck: Neck supple.  Cardiovascular: Normal rate, regular rhythm, normal heart sounds and intact distal pulses.  Pulses:      Dorsalis pedis pulses are 2+ on the right side,  and 2+ on the left side.       Posterior tibial pulses are 2+ on the right side, and 2+ on the left side.  2+ pitting edema with L>R.  No weeping noted at present but skin is shiny and very tense.   Pulmonary/Chest: Effort normal. No  stridor. No respiratory distress.  Neurological: She is alert and oriented to person, place, and time.  Psychiatric: She has a normal mood and affect.  Vitals reviewed.   Recent Results (from the past 2160 hour(s))  Basic metabolic panel     Status: Abnormal   Collection Time: 12/04/17 10:33 AM  Result Value Ref Range   Sodium 137 135 - 145 mmol/L   Potassium 4.5 3.5 - 5.1 mmol/L   Chloride 97 (L) 98 - 111 mmol/L   CO2 31 22 - 32 mmol/L   Glucose, Bld 156 (H) 70 - 99 mg/dL   BUN 19 8 - 23 mg/dL   Creatinine, Ser 0.57 0.44 - 1.00 mg/dL   Calcium 9.2 8.9 - 10.3 mg/dL   GFR calc non Af Amer >60 >60 mL/min   GFR calc Af Amer >60 >60 mL/min    Comment: (NOTE) The eGFR has been calculated using the CKD EPI equation. This calculation has not been validated in all clinical situations. eGFR's persistently <60 mL/min signify possible Chronic Kidney Disease.    Anion gap 9 5 - 15    Comment: Performed at [facility], [place] [address], [place], [place] [address]  CBC with Differential     Status: Abnormal   Collection Time: 12/04/17 10:33 AM  Result Value Ref Range   WBC 19.1 (H) 4.0 - 10.5 K/uL   RBC 4.69 3.87 - 5.11 MIL/uL   Hemoglobin 14.4 12.0 - 15.0 g/dL   HCT 43.8 36.0 - 46.0 %   MCV 93.4 78.0 - 100.0 fL   MCH 30.7 26.0 - 34.0 pg   MCHC 32.9 30.0 - 36.0 g/dL   RDW 13.2 11.5 - 15.5 %   Platelets 211 150 - 400 K/uL   Neutrophils Relative % 87 %   Neutro Abs 16.5 (H) 1.7 - 7.7 K/uL   Lymphocytes Relative 7 %   Lymphs Abs 1.3 0.7 - 4.0 K/uL   Monocytes Relative 6 %   Monocytes Absolute 1.2 (H) 0.1 - 1.0 K/uL   Eosinophils Relative 0 %   Eosinophils Absolute 0.0 0.0 - 0.7 K/uL   Basophils Relative 0 %   Basophils Absolute 0.0  0.0 - 0.1 K/uL    Comment: Performed at [facility], [place] [address] [place], [place] [address]  Type and screen [place]     Status: None   Collection Time: 12/04/17 10:33 AM  Result Value Ref Range   ABO/RH(D) A POS    Antibody Screen NEG    Sample Expiration      12/07/2017 Performed at [facility], [place] [address] [place], [place] [address]   Surgical pcr screen     Status: None   Collection Time: 12/04/17  4:57 PM  Result Value Ref Range   MRSA, PCR NEGATIVE NEGATIVE   Staphylococcus aureus NEGATIVE NEGATIVE    Comment: (NOTE) The Xpert SA Assay (FDA approved for NASAL specimens in patients 15 years of age and older), is one component of a comprehensive surveillance program. It is not intended to diagnose infection nor to guide or monitor treatment. Performed at [facility], [place] [address] [place], [place] [address]   CBC     Status: Abnormal   Collection Time: 12/05/17  5:11 AM  Result Value Ref Range   WBC 7.6 4.0 - 10.5 K/uL   RBC 3.29 (L) 3.87 - 5.11 MIL/uL   Hemoglobin 10.1 (L) 12.0 - 15.0 g/dL  Comment: DELTA CHECK NOTED REPEATED TO VERIFY    HCT 31.2 (L) 36.0 - 46.0 %   MCV 94.8 78.0 - 100.0 fL   MCH 30.7 26.0 - 34.0 pg   MCHC 32.4 30.0 - 36.0 g/dL   RDW 13.1 11.5 - 15.5 %   Platelets 123 (L) 150 - 400 K/uL    Comment: Performed at [facility], [place] [address] [place], [place] [address]  Basic metabolic panel     Status: Abnormal   Collection Time: 12/05/17  5:11 AM  Result Value Ref Range   Sodium 136 135 - 145 mmol/L   Potassium 4.7 3.5 - 5.1 mmol/L   Chloride 98 98 - 111 mmol/L   CO2 30 22 - 32 mmol/L   Glucose, Bld 131 (H) 70 - 99 mg/dL   BUN 15 8 - 23 mg/dL   Creatinine, Ser 0.50 0.44 - 1.00 mg/dL   Calcium 8.6 (L) 8.9 - 10.3 mg/dL   GFR calc non Af Amer >60 >60 mL/min   GFR calc Af Amer >60 >60 mL/min    Comment: (NOTE) The eGFR has been  calculated using the CKD EPI equation. This calculation has not been validated in all clinical situations. eGFR's persistently <60 mL/min signify possible Chronic Kidney Disease.    Anion gap 8 5 - 15    Comment: Performed at [facility], [place] [address] [place], [place] [address]  CBC with Differential/Platelet     Status: Abnormal   Collection Time: 12/06/17  4:57 AM  Result Value Ref Range   WBC 9.2 4.0 - 10.5 K/uL   RBC 3.02 (L) 3.87 - 5.11 MIL/uL   Hemoglobin 9.3 (L) 12.0 - 15.0 g/dL   HCT 28.4 (L) 36.0 - 46.0 %   MCV 94.0 78.0 - 100.0 fL   MCH 30.8 26.0 - 34.0 pg   MCHC 32.7 30.0 - 36.0 g/dL   RDW 13.3 11.5 - 15.5 %   Platelets 118 (L) 150 - 400 K/uL    Comment: REPEATED TO VERIFY SPECIMEN CHECKED FOR CLOTS PLATELET COUNT CONFIRMED BY SMEAR    Neutrophils Relative % 60 %   Neutro Abs 5.5 1.7 - 7.7 K/uL   Lymphocytes Relative 26 %   Lymphs Abs 2.4 0.7 - 4.0 K/uL   Monocytes Relative 13 %   Monocytes Absolute 1.2 (H) 0.1 - 1.0 K/uL   Eosinophils Relative 1 %   Eosinophils Absolute 0.1 0.0 - 0.7 K/uL   Basophils Relative 0 %   Basophils Absolute 0.0 0.0 - 0.1 K/uL    Comment: Performed at [facility], [place] [address], [place], [place] [address]  Comprehensive metabolic panel     Status: Abnormal   Collection Time: 12/06/17  4:57 AM  Result Value Ref Range   Sodium 134 (L) 135 - 145 mmol/L   Potassium 4.2 3.5 - 5.1 mmol/L   Chloride 97 (L) 98 - 111 mmol/L   CO2 33 (H) 22 - 32 mmol/L   Glucose, Bld 106 (H) 70 - 99 mg/dL   BUN 19 8 - 23 mg/dL   Creatinine, Ser 0.50 0.44 - 1.00 mg/dL   Calcium 9.0 8.9 - 10.3 mg/dL   Total Protein 5.0 (L) 6.5 - 8.1 g/dL   Albumin 2.9 (L) 3.5 - 5.0 g/dL   AST 23 15 - 41 U/L   ALT 14 0 - 44 U/L   Alkaline Phosphatase 48 38 - 126 U/L   Total Bilirubin 0.5 0.3 -  1.2 mg/dL   GFR calc non Af Amer >60 >60 mL/min   GFR calc Af Amer >60 >60 mL/min    Comment: (NOTE) The eGFR has been calculated using  the CKD EPI equation. This calculation has not been validated in all clinical situations. eGFR's persistently <60 mL/min signify possible Chronic Kidney Disease.    Anion gap 4 (L) 5 - 15    Comment: Performed at [facility], [place] [address], [place], [place] [address]  Magnesium     Status: None   Collection Time: 12/06/17  4:57 AM  Result Value Ref Range   Magnesium 1.8 1.7 - 2.4 mg/dL    Comment: Performed at [facility], [place] [address], [place], [place] [address]  Phosphorus     Status: None   Collection Time: 12/06/17  4:57 AM  Result Value Ref Range   Phosphorus 2.6 2.5 - 4.6 mg/dL    Comment: Performed at [facility], [place] [address], [place], [place] [address]  CBC     Status: None   Collection Time: 01/11/18  4:20 PM  Result Value Ref Range   WBC 9.5 4.0 - 10.5 K/uL   RBC 4.80 3.87 - 5.11 MIL/uL   Hemoglobin 14.5 12.0 - 15.0 g/dL   HCT 46.0 36.0 - 46.0 %   MCV 95.8 80.0 - 100.0 fL   MCH 30.2 26.0 - 34.0 pg   MCHC 31.5 30.0 - 36.0 g/dL   RDW 13.9 11.5 - 15.5 %   Platelets 221 150 - 400 K/uL   nRBC 0.0 0.0 - 0.2 %    Comment: Performed at [facility], [place] [address], [place], [place] [address]  Comprehensive metabolic panel     Status: Abnormal   Collection Time: 01/11/18  4:20 PM  Result Value Ref Range   Sodium 131 (L) 135 - 145 mmol/L   Potassium 4.3 3.5 - 5.1 mmol/L   Chloride 91 (L) 98 - 111 mmol/L   CO2 32 22 - 32 mmol/L   Glucose, Bld 91 70 - 99 mg/dL   BUN 12 8 - 23 mg/dL   Creatinine, Ser 0.50 0.44 - 1.00 mg/dL   Calcium 8.7 (L) 8.9 - 10.3 mg/dL   Total Protein 6.2 (L) 6.5 - 8.1 g/dL   Albumin 3.9 3.5 - 5.0 g/dL   AST 24 15 - 41 U/L   ALT 15 0 - 44 U/L   Alkaline Phosphatase 143 (H) 38 - 126 U/L   Total Bilirubin 1.0 0.3 - 1.2 mg/dL   GFR calc non Af Amer >60 >60 mL/min   GFR calc Af Amer >60 >60 mL/min    Comment: (NOTE) The eGFR has been calculated using the CKD EPI  equation. This calculation has not been validated in all clinical situations. eGFR's persistently <60 mL/min signify possible Chronic Kidney Disease.    Anion gap 8 5 - 15    Comment: Performed at [facility], [place] [address], [place], [place] [address]  Lipase, blood     Status: None   Collection Time: 01/11/18  4:20 PM  Result Value Ref Range   Lipase 18 11 - 51 U/L    Comment: Performed at [facility], [place] [address], [place], [place] [address]  Urinalysis, Routine w reflex microscopic     Status: Abnormal   Collection Time: 01/11/18  6:00 PM  Result Value Ref Range   Color, Urine YELLOW YELLOW   APPearance CLEAR  CLEAR   Specific Gravity, Urine 1.009 1.005 - 1.030   pH 5.0 5.0 - 8.0   Glucose, UA NEGATIVE NEGATIVE mg/dL   Hgb urine dipstick NEGATIVE NEGATIVE   Bilirubin Urine NEGATIVE NEGATIVE   Ketones, ur NEGATIVE NEGATIVE mg/dL   Protein, ur NEGATIVE NEGATIVE mg/dL   Nitrite NEGATIVE NEGATIVE   Leukocytes, UA SMALL (A) NEGATIVE   RBC / HPF 0-5 0 - 5 RBC/hpf   WBC, UA 11-20 0 - 5 WBC/hpf   Bacteria, UA RARE (A) NONE SEEN   Squamous Epithelial / LPF 0-5 0 - 5   Mucus PRESENT     Comment: Performed at [facility], [place] [address], [place], [place] [address]   Assessment/Plan: 1. Peripheral edema Significant edema that has been building over the past few weeks per patient and daughter. Lungs CTAB. Will CMP and BNP today to assess. Her last BMP revealed hyponatremia and abnormal potassium levels so want STAT lab results before diuresing outpatient. Recommend IV diuresis in the ER.  Declines ER assessment presently. Is in no acute distress so can wait on lab results. ER for any worsening symptoms.  - Comp Met (CMET) - B Nat Peptide - Lipase  2. Screening-pulmonary TB TB screen placed. - QuantiFERON-TB Gold Plus   Leeanne Rio, [place]

## 2018-02-27 NOTE — Patient Instructions (Addendum)
Please go to the lab today for blood work.  I will call you with your results. We will alter treatment regimen(s) if indicated by your results.   Continue compression stockings. Elevate legs while resting. Limit salt intake. Make sure to drink your Boost or Ensure at least once daily.   We will start medications once lab results are in. I need to make sure your sodium and potassium are stable from last check in October to make sur eI give you a safe dose of medication.

## 2018-02-28 ENCOUNTER — Emergency Department (HOSPITAL_COMMUNITY): Payer: Medicare Other

## 2018-02-28 ENCOUNTER — Ambulatory Visit: Payer: Medicare Other | Admitting: Physical Therapy

## 2018-02-28 ENCOUNTER — Encounter (HOSPITAL_COMMUNITY): Payer: Self-pay

## 2018-02-28 ENCOUNTER — Other Ambulatory Visit: Payer: Self-pay

## 2018-02-28 ENCOUNTER — Inpatient Hospital Stay (HOSPITAL_COMMUNITY)
Admission: EM | Admit: 2018-02-28 | Discharge: 2018-03-06 | DRG: 291 | Disposition: A | Discharge status: Skilled Nursing Facility | Payer: Medicare Other | Attending: Internal Medicine | Admitting: Internal Medicine

## 2018-02-28 DIAGNOSIS — Z823 Family history of stroke: Secondary | ICD-10-CM

## 2018-02-28 DIAGNOSIS — Z90711 Acquired absence of uterus with remaining cervical stump: Secondary | ICD-10-CM | POA: Diagnosis not present

## 2018-02-28 DIAGNOSIS — J9691 Respiratory failure, unspecified with hypoxia: Secondary | ICD-10-CM | POA: Diagnosis not present

## 2018-02-28 DIAGNOSIS — Z9841 Cataract extraction status, right eye: Secondary | ICD-10-CM | POA: Diagnosis not present

## 2018-02-28 DIAGNOSIS — J449 Chronic obstructive pulmonary disease, unspecified: Secondary | ICD-10-CM | POA: Diagnosis present

## 2018-02-28 DIAGNOSIS — Z8249 Family history of ischemic heart disease and other diseases of the circulatory system: Secondary | ICD-10-CM

## 2018-02-28 DIAGNOSIS — R627 Adult failure to thrive: Secondary | ICD-10-CM | POA: Diagnosis present

## 2018-02-28 DIAGNOSIS — Z888 Allergy status to other drugs, medicaments and biological substances status: Secondary | ICD-10-CM | POA: Diagnosis not present

## 2018-02-28 DIAGNOSIS — M7989 Other specified soft tissue disorders: Secondary | ICD-10-CM

## 2018-02-28 DIAGNOSIS — R0902 Hypoxemia: Secondary | ICD-10-CM

## 2018-02-28 DIAGNOSIS — E8770 Fluid overload, unspecified: Secondary | ICD-10-CM | POA: Diagnosis present

## 2018-02-28 DIAGNOSIS — C220 Liver cell carcinoma: Secondary | ICD-10-CM | POA: Diagnosis not present

## 2018-02-28 DIAGNOSIS — I5033 Acute on chronic diastolic (congestive) heart failure: Principal | ICD-10-CM | POA: Diagnosis present

## 2018-02-28 DIAGNOSIS — M8000XG Age-related osteoporosis with current pathological fracture, unspecified site, subsequent encounter for fracture with delayed healing: Secondary | ICD-10-CM | POA: Diagnosis not present

## 2018-02-28 DIAGNOSIS — H3530 Unspecified macular degeneration: Secondary | ICD-10-CM | POA: Diagnosis present

## 2018-02-28 DIAGNOSIS — E8779 Other fluid overload: Secondary | ICD-10-CM

## 2018-02-28 DIAGNOSIS — J438 Other emphysema: Secondary | ICD-10-CM | POA: Diagnosis not present

## 2018-02-28 DIAGNOSIS — M549 Dorsalgia, unspecified: Secondary | ICD-10-CM | POA: Diagnosis not present

## 2018-02-28 DIAGNOSIS — Z681 Body mass index (BMI) 19 or less, adult: Secondary | ICD-10-CM | POA: Diagnosis not present

## 2018-02-28 DIAGNOSIS — J9601 Acute respiratory failure with hypoxia: Secondary | ICD-10-CM | POA: Diagnosis present

## 2018-02-28 DIAGNOSIS — I712 Thoracic aortic aneurysm, without rupture: Secondary | ICD-10-CM | POA: Diagnosis not present

## 2018-02-28 DIAGNOSIS — Z9842 Cataract extraction status, left eye: Secondary | ICD-10-CM

## 2018-02-28 DIAGNOSIS — E871 Hypo-osmolality and hyponatremia: Secondary | ICD-10-CM | POA: Diagnosis present

## 2018-02-28 DIAGNOSIS — F419 Anxiety disorder, unspecified: Secondary | ICD-10-CM | POA: Diagnosis present

## 2018-02-28 DIAGNOSIS — I2510 Atherosclerotic heart disease of native coronary artery without angina pectoris: Secondary | ICD-10-CM | POA: Diagnosis not present

## 2018-02-28 DIAGNOSIS — F17210 Nicotine dependence, cigarettes, uncomplicated: Secondary | ICD-10-CM | POA: Diagnosis present

## 2018-02-28 DIAGNOSIS — K869 Disease of pancreas, unspecified: Secondary | ICD-10-CM | POA: Diagnosis present

## 2018-02-28 DIAGNOSIS — R0602 Shortness of breath: Secondary | ICD-10-CM | POA: Diagnosis not present

## 2018-02-28 DIAGNOSIS — I272 Pulmonary hypertension, unspecified: Secondary | ICD-10-CM | POA: Diagnosis present

## 2018-02-28 DIAGNOSIS — Z7401 Bed confinement status: Secondary | ICD-10-CM | POA: Diagnosis not present

## 2018-02-28 DIAGNOSIS — R2243 Localized swelling, mass and lump, lower limb, bilateral: Secondary | ICD-10-CM | POA: Diagnosis not present

## 2018-02-28 DIAGNOSIS — Z720 Tobacco use: Secondary | ICD-10-CM | POA: Diagnosis present

## 2018-02-28 DIAGNOSIS — I5032 Chronic diastolic (congestive) heart failure: Secondary | ICD-10-CM

## 2018-02-28 DIAGNOSIS — Z880 Allergy status to penicillin: Secondary | ICD-10-CM

## 2018-02-28 DIAGNOSIS — M797 Fibromyalgia: Secondary | ICD-10-CM | POA: Diagnosis present

## 2018-02-28 DIAGNOSIS — E440 Moderate protein-calorie malnutrition: Secondary | ICD-10-CM | POA: Diagnosis not present

## 2018-02-28 DIAGNOSIS — G894 Chronic pain syndrome: Secondary | ICD-10-CM | POA: Diagnosis not present

## 2018-02-28 DIAGNOSIS — Z66 Do not resuscitate: Secondary | ICD-10-CM | POA: Diagnosis present

## 2018-02-28 DIAGNOSIS — E43 Unspecified severe protein-calorie malnutrition: Secondary | ICD-10-CM | POA: Diagnosis present

## 2018-02-28 DIAGNOSIS — F329 Major depressive disorder, single episode, unspecified: Secondary | ICD-10-CM | POA: Diagnosis present

## 2018-02-28 DIAGNOSIS — Z79899 Other long term (current) drug therapy: Secondary | ICD-10-CM

## 2018-02-28 DIAGNOSIS — R609 Edema, unspecified: Secondary | ICD-10-CM | POA: Diagnosis not present

## 2018-02-28 DIAGNOSIS — Z8505 Personal history of malignant neoplasm of liver: Secondary | ICD-10-CM

## 2018-02-28 DIAGNOSIS — M2550 Pain in unspecified joint: Secondary | ICD-10-CM | POA: Diagnosis not present

## 2018-02-28 DIAGNOSIS — Z791 Long term (current) use of non-steroidal anti-inflammatories (NSAID): Secondary | ICD-10-CM

## 2018-02-28 DIAGNOSIS — I361 Nonrheumatic tricuspid (valve) insufficiency: Secondary | ICD-10-CM | POA: Diagnosis not present

## 2018-02-28 DIAGNOSIS — M6281 Muscle weakness (generalized): Secondary | ICD-10-CM | POA: Diagnosis not present

## 2018-02-28 DIAGNOSIS — M4014 Other secondary kyphosis, thoracic region: Secondary | ICD-10-CM | POA: Diagnosis not present

## 2018-02-28 DIAGNOSIS — Z882 Allergy status to sulfonamides status: Secondary | ICD-10-CM | POA: Diagnosis not present

## 2018-02-28 DIAGNOSIS — R262 Difficulty in walking, not elsewhere classified: Secondary | ICD-10-CM | POA: Diagnosis not present

## 2018-02-28 DIAGNOSIS — W19XXXA Unspecified fall, initial encounter: Secondary | ICD-10-CM | POA: Diagnosis present

## 2018-02-28 DIAGNOSIS — K8689 Other specified diseases of pancreas: Secondary | ICD-10-CM | POA: Diagnosis not present

## 2018-02-28 DIAGNOSIS — K5909 Other constipation: Secondary | ICD-10-CM | POA: Diagnosis present

## 2018-02-28 DIAGNOSIS — M8448XS Pathological fracture, other site, sequela: Secondary | ICD-10-CM | POA: Diagnosis not present

## 2018-02-28 DIAGNOSIS — R21 Rash and other nonspecific skin eruption: Secondary | ICD-10-CM | POA: Diagnosis present

## 2018-02-28 DIAGNOSIS — Z96649 Presence of unspecified artificial hip joint: Secondary | ICD-10-CM | POA: Diagnosis present

## 2018-02-28 DIAGNOSIS — G8929 Other chronic pain: Secondary | ICD-10-CM | POA: Diagnosis present

## 2018-02-28 DIAGNOSIS — Z79891 Long term (current) use of opiate analgesic: Secondary | ICD-10-CM

## 2018-02-28 DIAGNOSIS — M4854XA Collapsed vertebra, not elsewhere classified, thoracic region, initial encounter for fracture: Secondary | ICD-10-CM | POA: Diagnosis present

## 2018-02-28 DIAGNOSIS — R600 Localized edema: Secondary | ICD-10-CM | POA: Diagnosis not present

## 2018-02-28 DIAGNOSIS — J441 Chronic obstructive pulmonary disease with (acute) exacerbation: Secondary | ICD-10-CM | POA: Insufficient documentation

## 2018-02-28 DIAGNOSIS — M8088XD Other osteoporosis with current pathological fracture, vertebra(e), subsequent encounter for fracture with routine healing: Secondary | ICD-10-CM | POA: Diagnosis not present

## 2018-02-28 LAB — HEPATIC FUNCTION PANEL
ALT: 25 U/L (ref 0–44)
AST: 28 U/L (ref 15–41)
Albumin: 3.2 g/dL — ABNORMAL LOW (ref 3.5–5.0)
Alkaline Phosphatase: 113 U/L (ref 38–126)
Bilirubin, Direct: 0.2 mg/dL (ref 0.0–0.2)
Indirect Bilirubin: 0.3 mg/dL (ref 0.3–0.9)
Total Bilirubin: 0.5 mg/dL (ref 0.3–1.2)
Total Protein: 5.6 g/dL — ABNORMAL LOW (ref 6.5–8.1)

## 2018-02-28 LAB — CBC
HCT: 46.5 % — ABNORMAL HIGH (ref 36.0–46.0)
Hemoglobin: 14.4 g/dL (ref 12.0–15.0)
MCH: 29.4 pg (ref 26.0–34.0)
MCHC: 31 g/dL (ref 30.0–36.0)
MCV: 95.1 fL (ref 80.0–100.0)
Platelets: 221 10*3/uL (ref 150–400)
RBC: 4.89 MIL/uL (ref 3.87–5.11)
RDW: 13.8 % (ref 11.5–15.5)
WBC: 8 10*3/uL (ref 4.0–10.5)
nRBC: 0 % (ref 0.0–0.2)

## 2018-02-28 LAB — COMPREHENSIVE METABOLIC PANEL
ALT: 20 U/L (ref 0–35)
AST: 27 U/L (ref 0–37)
Albumin: 3.5 g/dL (ref 3.5–5.2)
Alkaline Phosphatase: 138 U/L — ABNORMAL HIGH (ref 39–117)
BUN: 23 mg/dL (ref 6–23)
CO2: 36 mEq/L — ABNORMAL HIGH (ref 19–32)
Calcium: 8.7 mg/dL (ref 8.4–10.5)
Chloride: 91 mEq/L — ABNORMAL LOW (ref 96–112)
Creatinine, Ser: 0.53 mg/dL (ref 0.40–1.20)
GFR: 120.17 mL/min (ref >60.00)
Glucose, Bld: 99 mg/dL (ref 70–99)
Potassium: 5.5 mEq/L — ABNORMAL HIGH (ref 3.5–5.1)
Sodium: 130 mEq/L — ABNORMAL LOW (ref 135–145)
Total Bilirubin: 0.4 mg/dL (ref 0.2–1.2)
Total Protein: 5.8 g/dL — ABNORMAL LOW (ref 6.0–8.3)

## 2018-02-28 LAB — BASIC METABOLIC PANEL
Anion gap: 9 (ref 5–15)
BUN: 17 mg/dL (ref 8–23)
CO2: 32 mmol/L (ref 22–32)
Calcium: 8 mg/dL — ABNORMAL LOW (ref 8.9–10.3)
Chloride: 88 mmol/L — ABNORMAL LOW (ref 98–111)
Creatinine, Ser: 0.38 mg/dL — ABNORMAL LOW (ref 0.44–1.00)
GFR calc Af Amer: >60 mL/min (ref >60)
GFR calc non Af Amer: >60 mL/min (ref >60)
Glucose, Bld: 91 mg/dL (ref 70–99)
Potassium: 5.1 mmol/L (ref 3.5–5.1)
Sodium: 129 mmol/L — ABNORMAL LOW (ref 135–145)

## 2018-02-28 LAB — TROPONIN I: Troponin I: 0.06 ng/mL (ref <0.03)

## 2018-02-28 LAB — BRAIN NATRIURETIC PEPTIDE
B Natriuretic Peptide: 772.7 pg/mL — ABNORMAL HIGH (ref 0.0–100.0)
Pro B Natriuretic peptide (BNP): 749 pg/mL — ABNORMAL HIGH (ref 0.0–100.0)

## 2018-02-28 LAB — URINALYSIS, ROUTINE W REFLEX MICROSCOPIC
Bilirubin Urine: NEGATIVE
Glucose, UA: NEGATIVE mg/dL
Hgb urine dipstick: NEGATIVE
Ketones, ur: NEGATIVE mg/dL
Nitrite: NEGATIVE
Protein, ur: NEGATIVE mg/dL
Specific Gravity, Urine: 1.012 (ref 1.005–1.030)
pH: 7 (ref 5.0–8.0)

## 2018-02-28 LAB — I-STAT TROPONIN, ED: Troponin i, poc: 0.02 ng/mL (ref 0.00–0.08)

## 2018-02-28 LAB — LIPASE: Lipase: 6 U/L — ABNORMAL LOW (ref 11.0–59.0)

## 2018-02-28 MED ORDER — ACETAMINOPHEN 325 MG PO TABS
650.0000 mg | ORAL_TABLET | Freq: Four times a day (QID) | ORAL | Status: DC | PRN
  Administered 2018-03-01: 650 mg via ORAL
  Filled 2018-02-28: qty 2

## 2018-02-28 MED ORDER — IPRATROPIUM-ALBUTEROL 0.5-2.5 (3) MG/3ML IN SOLN: 3.0000 mL | RESPIRATORY_TRACT | Status: DC | PRN

## 2018-02-28 MED ORDER — BUPROPION HCL ER (XL) 150 MG PO TB24
150.0000 mg | ORAL_TABLET | Freq: Every day | ORAL | Status: DC
  Administered 2018-03-01 – 2018-03-03 (×3): 150 mg via ORAL
  Filled 2018-02-28 (×3): qty 1

## 2018-02-28 MED ORDER — SODIUM CHLORIDE 0.9% FLUSH: 3.0000 mL | INTRAVENOUS | Status: DC | PRN

## 2018-02-28 MED ORDER — METHYLPREDNISOLONE SODIUM SUCC 125 MG IJ SOLR: 125.0000 mg | Freq: Once | INTRAMUSCULAR | Status: DC

## 2018-02-28 MED ORDER — HYDROCODONE-ACETAMINOPHEN 5-325 MG PO TABS
1.0000 | ORAL_TABLET | ORAL | Status: DC | PRN
  Administered 2018-03-02 (×4): 1 via ORAL
  Administered 2018-03-03 – 2018-03-05 (×10): 2 via ORAL
  Filled 2018-02-28 (×3): qty 2
  Filled 2018-02-28: qty 1
  Filled 2018-02-28: qty 2
  Filled 2018-02-28: qty 1
  Filled 2018-02-28 (×7): qty 2

## 2018-02-28 MED ORDER — ACETAMINOPHEN 650 MG RE SUPP: 650.0000 mg | Freq: Four times a day (QID) | RECTAL | Status: DC | PRN

## 2018-02-28 MED ORDER — SODIUM CHLORIDE 0.9% FLUSH
3.0000 mL | Freq: Two times a day (BID) | INTRAVENOUS | Status: DC
  Administered 2018-03-01 – 2018-03-05 (×10): 3 mL via INTRAVENOUS

## 2018-02-28 MED ORDER — ONDANSETRON HCL 4 MG/2ML IJ SOLN
4.0000 mg | Freq: Four times a day (QID) | INTRAMUSCULAR | Status: DC | PRN
  Administered 2018-03-03 – 2018-03-04 (×2): 4 mg via INTRAVENOUS
  Filled 2018-02-28 (×2): qty 2

## 2018-02-28 MED ORDER — METHYLNALTREXONE BROMIDE 150 MG PO TABS: 450.0000 mg | ORAL_TABLET | Freq: Every day | ORAL | Status: DC

## 2018-02-28 MED ORDER — FUROSEMIDE 10 MG/ML IJ SOLN
40.0000 mg | Freq: Once | INTRAMUSCULAR | Status: AC
  Administered 2018-02-28: 40 mg via INTRAVENOUS
  Filled 2018-02-28: qty 4

## 2018-02-28 MED ORDER — OXYCODONE-ACETAMINOPHEN 5-325 MG PO TABS
1.0000 | ORAL_TABLET | Freq: Four times a day (QID) | ORAL | Status: DC | PRN
  Administered 2018-03-01 – 2018-03-03 (×3): 1 via ORAL
  Filled 2018-02-28 (×3): qty 1

## 2018-02-28 MED ORDER — SODIUM CHLORIDE 0.9 % IV SOLN: 250.0000 mL | INTRAVENOUS | Status: DC | PRN

## 2018-02-28 MED ORDER — POLYETHYLENE GLYCOL 3350 17 G PO PACK
17.0000 g | PACK | Freq: Every day | ORAL | Status: DC
  Administered 2018-03-01 – 2018-03-04 (×4): 17 g via ORAL
  Filled 2018-02-28 (×4): qty 1

## 2018-02-28 MED ORDER — ENOXAPARIN SODIUM 30 MG/0.3ML ~~LOC~~ SOLN
30.0000 mg | SUBCUTANEOUS | Status: DC
  Administered 2018-03-01 – 2018-03-03 (×4): 30 mg via SUBCUTANEOUS
  Filled 2018-02-28 (×6): qty 0.3

## 2018-02-28 MED ORDER — ONDANSETRON HCL 4 MG PO TABS: 4.0000 mg | ORAL_TABLET | Freq: Four times a day (QID) | ORAL | Status: DC | PRN

## 2018-02-28 MED ORDER — FUROSEMIDE 10 MG/ML IJ SOLN
40.0000 mg | Freq: Two times a day (BID) | INTRAMUSCULAR | Status: DC
  Administered 2018-03-01 – 2018-03-04 (×7): 40 mg via INTRAVENOUS
  Filled 2018-02-28 (×8): qty 4

## 2018-02-28 MED ORDER — BISACODYL 10 MG RE SUPP: 10.0000 mg | Freq: Every day | RECTAL | Status: DC | PRN

## 2018-02-28 NOTE — Plan of Care (Signed)
Plan of care discussed.   

## 2018-02-28 NOTE — ED Provider Notes (Signed)
[facility] DEPT Provider Note   CSN: [id number] Arrival date & time: 02/28/18  1422     History   Chief Complaint Chief Complaint  Patient presents with  . Leg Swelling    HPI Tina Bailey is a 72 y.o. female.  72 y.o female with a PMH of COPD, Fibromyalgia presents to the ED with a chief complaint of Bilateral leg swelling x 3 weeks. Patient reports she has noted increased in swelling to her left leg for the past week. She had a similar episode three months ago with swelling to her right leg but this happened after a fixation of a right hip fracture. Patient's daughter at the bedside states she was seen by her Orthopedist yesterday who advised the swelling to her legs was not related to the surgery.Patient was also seen by her PCP yesterday and had labs drawn, she was called today and told "her sodium is critically low and she needs to seek medical attention in the ED for diuresis". Daughter states they have tried elevation of her legs, compression socks but see no improvement on the swelling. She also noted a slight weeping rash to the lower left leg. She reports patient has gone from a small to a large on her compression socks. Patient was stating at ~80% range during arrival and was placed on 3L of O2. According to daughter patient needs to on oxygen and was told this by her PCP but patient refused. Daughter reports she usually stats around 70-80% range. Patient reports some shortness of breath worse with activity. She denies any abdominal pain, fever, orthopnea, or chest pain.      Past Medical History:  Diagnosis Date  . Arthritis    DDD. Right shoulder"is frozen"-limited ROM. osteoporosis.  . Blue toe syndrome ([place]) 11/27/2014   Dr. Claudia Pollock evaluating  . Cancer ([place]) 12/2014   liver cancer  . Complication of anesthesia   . COPD (chronic obstructive pulmonary disease) ([place])   . Fibromyalgia   . Fracture of rib of right side    hx  "osteoporosis"- states her dog nudge her on the side, next day developed great pain and was told has a fracture rib.  . Macular degeneration    R eye  . Osteoporosis   . PONV (postoperative nausea and vomiting)    nausea,severe vomiting after 01-13-15 portal vein embolization  . Productive cough   . Retina disorder    L eye, vision distorted, edema  . Spine fracture due to birth trauma   . TMJ disease   . Wears glasses     Patient Active Problem List   Diagnosis Date Noted  . COPD with acute exacerbation ([place]) 02/28/2018  . Hyponatremia 02/28/2018  . Fluid overload 02/28/2018  . Acute blood loss anemia 12/06/2017  . Thrombocytopenia ([place]) 12/06/2017  . Severe protein-calorie malnutrition ([place]) 12/05/2017  . Closed comminuted intertrochanteric fracture of proximal end of right femur ([place]) 12/04/2017  . Chronic thoracic back pain 03/31/2017  . Physical exam 01/09/2017  . Malnutrition of moderate degree 12/22/2016  . Anxiety about health 09/27/2016  . Osteoporosis 07/13/2016  . Chronic narcotic use 07/13/2016  . Constipation 04/26/2015  . Chronic pain 04/26/2015  . Depression 04/26/2015  . COPD GOLD IV / still somking 03/03/2015  . Cigarette smoker 03/03/2015  . Cough 03/03/2015  . Hepatocellular carcinoma ([place]) 12/30/2014  . Pancreatic mass 12/04/2014  . Blue toe syndrome ([place]) 11/27/2014    Past Surgical History:  Procedure Laterality Date  .  ANGIOPLASTY  2008   no stents required, no follow-up with cardiologist, no recurrent chest pain  . CATARACT EXTRACTION, BILATERAL Bilateral   . ESOPHAGOGASTRODUODENOSCOPY (EGD) WITH PROPOFOL N/A 06/25/2015   Procedure: ESOPHAGOGASTRODUODENOSCOPY (EGD) WITH PROPOFOL;  Surgeon: Milus Banister, MD;  Location: [facility] ENDOSCOPY;  Service: Endoscopy;  Laterality: N/A;  stent removal   . EYE SURGERY     cornea surgery  . INTRAMEDULLARY (IM) NAIL INTERTROCHANTERIC Right 12/04/2017   Procedure: INTRAMEDULLARY (IM) NAIL INTERTROCHANTRIC;   Surgeon: Rod Can, MD;  Location: [facility] ORS;  Service: Orthopedics;  Laterality: Right;  . IR RADIOLOGIST EVAL & MGMT  03/29/2017  . KIDNEY STONE SURGERY    . LAPAROSCOPIC PARTIAL HEPATECTOMY N/A 03/04/2015   Procedure: DIAGNOSTIC LAPAROSCOPY, EXTENDED RIGHT HEPATECTOMY, WITH INTRAOPERATIVE ULTRASOUND;  Surgeon: Stark Klein, MD;  Location: [facility] ORS;  Service: General;  Laterality: N/A;  . PARTIAL HYSTERECTOMY    . portal vein embolization     01-13-15 -Dr. Cathlean Sauer.     OB History    Gravida  3   Para      Term      Preterm      AB      Living  3     SAB      TAB      Ectopic      Multiple      Live Births               Home Medications    Prior to Admission medications   Medication Sig Start Date End Date Taking? Authorizing Provider  bisacodyl (DULCOLAX) 10 MG suppository Place 1 suppository (10 mg total) rectally daily as needed for moderate constipation. 12/06/17  Yes Debbe Odea, MD  buPROPion (WELLBUTRIN XL) 150 MG 24 hr tablet Take 150 mg by mouth daily.  01/11/18  Yes [provider]  Calcium Carbonate-Vitamin D (CALCIUM 500+D HIGH POTENCY PO) Take 1 tablet by mouth daily.    Yes [provider]  Cholecalciferol (VITAMIN D3) 5000 units CAPS Take 5,000 Units by mouth daily.   Yes [provider]  EVENITY 105 MG/1.17ML SOSY Inject 210 mg into the skin once.  12/11/17  Yes [provider]  feeding supplement, ENSURE ENLIVE, (ENSURE ENLIVE) LIQD Take 237 mLs by mouth 2 (two) times daily between meals. 12/06/17  Yes Debbe Odea, MD  Glycopyrrolate-Formoterol (BEVESPI AEROSPHERE) 9-4.8 MCG/ACT AERO Inhale 2 puffs into the lungs 2 (two) times daily. Patient taking differently: Inhale 2 puffs into the lungs daily as needed (shortness of breath).  05/30/17  Yes Tanda Rockers, MD  HYDROmorphone (DILAUDID) 4 MG tablet Take 4 mg by mouth every 6 (six) hours as needed for severe pain.   Yes [provider]    ipratropium-albuterol (DUONEB) 0.5-2.5 (3) MG/3ML SOLN Take 3 mLs by nebulization every 4 (four) hours as needed (SOB).    Yes [provider]  Multiple Vitamins-Minerals (CENTRUM SILVER ULTRA WOMENS PO) Take 1 tablet by mouth daily.    Yes [provider]  naproxen sodium (ALEVE) 220 MG tablet Take 440 mg by mouth 3 (three) times daily as needed (pain).    Yes [provider]  polyethylene glycol (MIRALAX / GLYCOLAX) packet Take 17 g by mouth daily. 12/06/17  Yes Debbe Odea, MD  promethazine (PHENERGAN) 12.5 MG tablet Take 12.5 mg by mouth every 6 (six) hours as needed for nausea or vomiting.   Yes [provider]  RELISTOR 150 MG TABS Take 3 tablets  by mouth daily. 01/04/18  Yes [provider]  vitamin B-12 (CYANOCOBALAMIN) 1000 MCG tablet Take 1,000 mcg by mouth daily.   Yes [provider]    Family History Family History  Problem Relation Age of Onset  . Hypertension Mother   . Atrial fibrillation Mother   . Heart disease Mother        after age 67  . Stroke Father   . Dementia Father   . Heart disease Brother        After age 23- A-Fib  . Heart attack Brother     Social History Social History   Tobacco Use  . Smoking status: Current Every Day Smoker    Packs/day: 0.50    Years: 45.00    Pack years: 22.50    Types: Cigarettes  . Smokeless tobacco: Never Used  Substance Use Topics  . Alcohol use: No    Alcohol/week: 0.0 standard drinks  . Drug use: No     Allergies   Sertraline hcl; Amoxicillin-pot clavulanate; Clindamycin/lincomycin; Paroxetine hcl; and Sulfa antibiotics   Review of Systems Review of Systems  Constitutional: Negative for fever.  HENT: Negative for sore throat.   Respiratory: Positive for shortness of breath.   Cardiovascular: Positive for leg swelling. Negative for chest pain.  Gastrointestinal: Negative for abdominal pain, diarrhea, nausea and vomiting.  Genitourinary: Negative for  dysuria and flank pain.  Musculoskeletal: Negative for back pain.  Skin: Negative for pallor and wound.  Neurological: Negative for light-headedness and headaches.     Physical Exam Updated Vital Signs BP (!) 153/92   Pulse 88   Temp 97.8 F (36.6 C) (Oral)   Resp 14   Ht 5\' 3"  (1.6 m)   Wt 45.4 kg   SpO2 (!) 86%   BMI 17.71 kg/m   Physical Exam  Constitutional: She is oriented to person, place, and time. She appears well-developed and well-nourished.  HENT:  Head: Normocephalic and atraumatic.  Eyes: Pupils are equal, round, and reactive to light.  Neck: Normal range of motion. Neck supple.  Cardiovascular: Normal heart sounds.  Pulmonary/Chest: Effort normal. She has wheezes in the right upper field and the left upper field. She has rales in the right middle field and the left middle field.  Abdominal: Soft. There is no tenderness.  Neurological: She is alert and oriented to person, place, and time.  Skin: Skin is warm and dry. Rash noted. There is erythema.  3+ pitting edema on BLE.Rash noted to the left lower leg consistent with weeping.   Nursing note and vitals reviewed.    ED Treatments / Results  Labs (all labs ordered are listed, but only abnormal results are displayed) Labs Reviewed  BASIC METABOLIC PANEL - Abnormal; Notable for the following components:      Result Value   Sodium 129 (*)    Chloride 88 (*)    Creatinine, Ser 0.38 (*)    Calcium 8.0 (*)    All other components within normal limits  CBC - Abnormal; Notable for the following components:   HCT 46.5 (*)    All other components within normal limits  HEPATIC FUNCTION PANEL - Abnormal; Notable for the following components:   Total Protein 5.6 (*)    Albumin 3.2 (*)    All other components within normal limits  URINALYSIS, ROUTINE W REFLEX MICROSCOPIC - Abnormal; Notable for the following components:   APPearance HAZY (*)    Leukocytes, UA MODERATE (*)    Bacteria, UA  FEW (*)    All other  components within normal limits  BRAIN NATRIURETIC PEPTIDE - Abnormal; Notable for the following components:   B Natriuretic Peptide 772.7 (*)    All other components within normal limits  I-STAT TROPONIN, ED    EKG EKG Interpretation  Date/Time:  Wednesday February 28 2018 15:37:12 EST Ventricular Rate:  94 PR Interval:    QRS Duration: 77 QT Interval:  348 QTC Calculation: 436 R Axis:   104 Text Interpretation:  Sinus rhythm Anterior infarct, old Nonspecific T abnormalities, lateral leads Baseline wander in lead(s) V2 Since last tracing Nonspecific T wave abnormality Anterior leads Confirmed by Daleen Bo [phone]) on 02/28/2018 3:58:19 PM   Radiology Dg Chest 2 View  Result Date: 02/28/2018 CLINICAL DATA:  COPD and hyponatremia EXAM: CHEST - 2 VIEW COMPARISON:  01/11/2018 FINDINGS: Cardiac shadow is stable. Aortic calcifications are seen as well as tortuosity stable from the previous exam. Small new effusions are seen bilaterally. Mild increased vascular congestion is noted when compare with the prior exam. Old sternal fractures are noted multiple compression fractures are seen in the thoracic spine stable from the previous exam. No new focal abnormality is seen. IMPRESSION: Changes of mild CHF with vascular congestion and small effusions. Electronically Signed   By: Inez Catalina M.D.   On: 02/28/2018 16:28    Procedures Procedures (including critical care time)  Medications Ordered in ED Medications  furosemide (LASIX) injection 40 mg (40 mg Intravenous Given 02/28/18 1756)     Initial Impression / Assessment and Plan / ED Course  I have reviewed the triage vital signs and the nursing notes.  Pertinent labs & imaging results that were available during my care of the patient were reviewed by me and considered in my medical decision making (see chart for details).    Presents with bilateral leg swelling x3 weeks.  She was seen by her orthopedist yesterday who cleared her  from any orthopedic issue, she was also seen by her PCP yesterday who had on blood and called her this evening due to her low sodium, she told to come to the ED for diuresis. Was given a prescription for home oxygen several months ago per pulmonology but patient refusing this treatment at this time.  The home oxygen was discontinued.  Daughter at the bedside reports patient gets short of breath with activity, she usually satting between the 26s and 80.  On arrival patient was at 88% she was placed on 3 L of oxygen and has now improved. CBC showed no leukocytosis, hematocrit is elevated, she does have an underlying COPD.  BMP showed slight decrease in sodium at 2.9 I believe this is due to volume overload.  BNP was 772.7, hepatic function was within normal limits.  UA is currently in process.  DG chest 2 view showed Changes of mild CHF with vascular congestion and small effusions.  7:17 PM Spoke to Dr. Roel Cluck who will evaluated and admit patient.  Daughter [name] phone #'s: Cell [phone] Home [phone]     Final Clinical Impressions(s) / ED Diagnoses   Final diagnoses:  Leg swelling  Hyponatremia  Oxygen desaturation    ED Discharge Orders    None       Janeece Fitting, Hershal Coria 02/28/18 1919    Daleen Bo, MD 02/28/18 2110

## 2018-02-28 NOTE — H&P (Signed)
Tina Bailey JJK:[id number] DOB: 1946/01/21 DOA: 02/28/2018     PCP: Midge Minium, MD   Outpatient Specialists:    [facility] Patient arrived to ER on 02/28/18 at 1422  Patient coming from: home Lives  With family   Chief Complaint:  Chief Complaint  Patient presents with  . Leg Swelling    HPI: Tina Bailey is a 72 y.o. female with medical history significant of protein-calorie malnourishment and remote history of [place] in remission.  severe osteoporosis with multiple fractures    Presented with  Leg edema Reports leg swelling getting progressively worse over the past few days but have been swelling for few weeks  Now left worse than right She tried to use support hose but did not help Developed a rash all over her body excoriations that are pruritic. She has been picking at them No prior hx of eczema,  He has been loosing weight but recently started to gain it back   She usually not on oxygen at baseline  No fever no chest pain  She recently broke her  sternum after  A fall reports her osteoporosis so bad if she sneezes   her ribs break    Regarding pertinent Chronic problems:     History of  osteoporosis status post fractures recent hip replacement  Denies any history of heart failure or CHF.   History of [place] status post resection currently in remission While in ER: Gave lasix 40 mg IV  The following Work up has been ordered so far:  Orders Placed This Encounter  Procedures  . DG Chest 2 View  . Basic metabolic panel  . CBC  . Hepatic function panel  . Urinalysis, Routine w reflex microscopic  . Brain natriuretic peptide  . Consult to hospitalist  . I-stat troponin, ED  . ED EKG  . EKG 12-Lead      Following Medications were ordered in ER: Medications  methylPREDNISolone sodium succinate (SOLU-MEDROL) 125 mg/2 mL injection 125 mg (has no administration in time range)  furosemide (LASIX) injection 40 mg (40 mg  Intravenous Given 02/28/18 1756)    Significant initial  Findings: Abnormal Labs Reviewed  BASIC METABOLIC PANEL - Abnormal; Notable for the following components:      Result Value   Sodium 129 (*)    Chloride 88 (*)    Creatinine, Ser 0.38 (*)    Calcium 8.0 (*)    All other components within normal limits  CBC - Abnormal; Notable for the following components:   HCT 46.5 (*)    All other components within normal limits  HEPATIC FUNCTION PANEL - Abnormal; Notable for the following components:   Total Protein 5.6 (*)    Albumin 3.2 (*)    All other components within normal limits  URINALYSIS, ROUTINE W REFLEX MICROSCOPIC - Abnormal; Notable for the following components:   APPearance HAZY (*)    Leukocytes, UA MODERATE (*)    Bacteria, UA FEW (*)    All other components within normal limits  BRAIN NATRIURETIC PEPTIDE - Abnormal; Notable for the following components:   B Natriuretic Peptide 772.7 (*)    All other components within normal limits     Lactic Acid, Venous No results found for: LATICACIDVEN  Na 129 K 5.1  Cr stable,   Lab Results  Component Value Date   CREATININE 0.38 (L) 02/28/2018   CREATININE 0.53 02/27/2018   CREATININE 0.50 01/11/2018    trop 0.02  WBC  8.0  HG/HCT stable,      Component Value Date/Time   HGB 14.4 02/28/2018 1527   HGB 15.0 10/19/2016 1127   HCT 46.5 (H) 02/28/2018 1527   HCT 45.2 10/19/2016 1127   Troponin (Point of Care Test) Recent Labs    02/28/18 1548  TROPIPOC 0.02    BNP (last 3 results) Recent Labs    02/28/18 1529  BNP 772.7*    ProBNP (last 3 results) Recent Labs    02/27/18 1545  PROBNP 749.0*      UA   no evidence of UTI     CXR - mild CHF   ECG:  Personally reviewed by me showing: HR : 94 Rhythm:  NSR,    nonspecific changes,  QTC 436    ED Triage Vitals  Enc Vitals Group     BP 02/28/18 1440 (!) 154/93     Pulse Rate 02/28/18 1440 94     Resp 02/28/18 1440 16     Temp 02/28/18  1440 97.8 F (36.6 C)     Temp Source 02/28/18 1440 Oral     SpO2 02/28/18 1440 95 %     Weight 02/28/18 1440 100 lb (45.4 kg)     Height 02/28/18 1440 5\' 3"  (1.6 m)     Head Circumference --      Peak Flow --      Pain Score 02/28/18 1447 8     Pain Loc --      Pain Edu? --      Excl. in [place]? --   TMAX(24)@       Latest  Blood pressure (!) 153/92, pulse 88, temperature 97.8 F (36.6 C), temperature source Oral, resp. rate 14, height 5\' 3"  (1.6 m), weight 45.4 kg, SpO2 (!) 86 %.   Hospitalist was called for admission for fluid overload   Review of Systems:    Pertinent positives include: back pain, leg swelling  shortness of breath at rest.  Constitutional:  No weight loss, night sweats, Fevers, chills, fatigue, weight loss  HEENT:  No headaches, Difficulty swallowing,Tooth/dental problems,Sore throat,  No sneezing, itching, ear ache, nasal congestion, post nasal drip,  Cardio-vascular:  No chest pain, Orthopnea, PND, anasarca, dizziness, palpitations.no Bilateral lower extremity swelling  GI:  No heartburn, indigestion, abdominal pain, nausea, vomiting, diarrhea, change in bowel habits, loss of appetite, melena, blood in stool, hematemesis Resp:  no No dyspnea on exertion, No excess mucus, no productive cough, No non-productive cough, No coughing up of blood.No change in color of mucus.No wheezing. Skin:  no rash or lesions. No jaundice GU:  no dysuria, change in color of urine, no urgency or frequency. No straining to urinate.  No flank pain.  Musculoskeletal:  No joint pain or no joint swelling. No decreased range of motion. No back pain.  Psych:  No change in mood or affect. No depression or anxiety. No memory loss.  Neuro: no localizing neurological complaints, no tingling, no weakness, no double vision, no gait abnormality, no slurred speech, no confusion  All systems reviewed and apart from [place] all are negative  Past Medical History:   Past Medical History:    Diagnosis Date  . Arthritis    DDD. Right shoulder"is frozen"-limited ROM. osteoporosis.  . Blue toe syndrome ([place]) 11/27/2014   Dr. Claudia Pollock evaluating  . Cancer ([place]) 12/2014   liver cancer  . Complication of anesthesia   . COPD (chronic obstructive pulmonary disease) ([place])   . Fibromyalgia   .  Fracture of rib of right side    hx "osteoporosis"- states her dog nudge her on the side, next day developed great pain and was told has a fracture rib.  . Macular degeneration    R eye  . Osteoporosis   . PONV (postoperative nausea and vomiting)    nausea,severe vomiting after 01-13-15 portal vein embolization  . Productive cough   . Retina disorder    L eye, vision distorted, edema  . Spine fracture due to birth trauma   . TMJ disease   . Wears glasses       Past Surgical History:  Procedure Laterality Date  . ANGIOPLASTY  2008   no stents required, no follow-up with cardiologist, no recurrent chest pain  . CATARACT EXTRACTION, BILATERAL Bilateral   . ESOPHAGOGASTRODUODENOSCOPY (EGD) WITH PROPOFOL N/A 06/25/2015   Procedure: ESOPHAGOGASTRODUODENOSCOPY (EGD) WITH PROPOFOL;  Surgeon: Milus Banister, MD;  Location: [facility];  Service: Endoscopy;  Laterality: N/A;  stent removal   . EYE SURGERY     cornea surgery  . INTRAMEDULLARY (IM) NAIL INTERTROCHANTERIC Right 12/04/2017   Procedure: INTRAMEDULLARY (IM) NAIL INTERTROCHANTRIC;  Surgeon: Rod Can, MD;  Location: [facility];  Service: Orthopedics;  Laterality: Right;  . IR RADIOLOGIST EVAL & MGMT  03/29/2017  . KIDNEY STONE SURGERY    . LAPAROSCOPIC PARTIAL HEPATECTOMY N/A 03/04/2015   Procedure: DIAGNOSTIC LAPAROSCOPY, EXTENDED RIGHT HEPATECTOMY, WITH INTRAOPERATIVE ULTRASOUND;  Surgeon: Stark Klein, MD;  Location: [facility];  Service: General;  Laterality: N/A;  . PARTIAL HYSTERECTOMY    . portal vein embolization     01-13-15 -Dr. Cathlean Sauer.    Social History:  Ambulatory  Independently     reports that she  has been smoking cigarettes. She has a 22.50 pack-year smoking history. She has never used smokeless tobacco. She reports that she does not drink alcohol or use drugs.     Family History:   Family History  Problem Relation Age of Onset  . Hypertension Mother   . Atrial fibrillation Mother   . Heart disease Mother        after age 41  . Stroke Father   . Dementia Father   . Heart disease Brother        After age 93- A-Fib  . Heart attack Brother     Allergies: Allergies  Allergen Reactions  . Sertraline Hcl     Zoloft, had a terrible reaction  . Amoxicillin-Pot Clavulanate Nausea Only    Has patient had a PCN reaction causing immediate rash, facial/tongue/throat swelling, SOB or lightheadedness with hypotension: No Has patient had a PCN reaction causing severe rash involving mucus membranes or skin necrosis: No Has patient had a PCN reaction that required hospitalization No Has patient had a PCN reaction occurring within the last 10 years: No If all of the above answers are "NO", then may proceed with Cephalosporin use.   . Clindamycin/Lincomycin Other (See Comments)    Felt like it "burned out" her stomach/took a large dose  . Paroxetine Hcl Rash  . Sulfa Antibiotics Rash     Prior to Admission medications   Medication Sig Start Date End Date Taking? Authorizing Provider  bisacodyl (DULCOLAX) 10 MG suppository Place 1 suppository (10 mg total) rectally daily as needed for moderate constipation. 12/06/17  Yes Debbe Odea, MD  buPROPion (WELLBUTRIN XL) 150 MG 24 hr tablet Take 150 mg by mouth daily.  01/11/18  Yes [provider]  Calcium Carbonate-Vitamin D (CALCIUM 500+D HIGH  POTENCY PO) Take 1 tablet by mouth daily.    Yes [provider]  Cholecalciferol (VITAMIN D3) 5000 units CAPS Take 5,000 Units by mouth daily.   Yes [provider]  EVENITY 105 MG/1.17ML SOSY Inject 210 mg into the skin once.  12/11/17  Yes [provider]    feeding supplement, ENSURE ENLIVE, (ENSURE ENLIVE) LIQD Take 237 mLs by mouth 2 (two) times daily between meals. 12/06/17  Yes Debbe Odea, MD  Glycopyrrolate-Formoterol (BEVESPI AEROSPHERE) 9-4.8 MCG/ACT AERO Inhale 2 puffs into the lungs 2 (two) times daily. Patient taking differently: Inhale 2 puffs into the lungs daily as needed (shortness of breath).  05/30/17  Yes Tanda Rockers, MD  HYDROmorphone (DILAUDID) 4 MG tablet Take 4 mg by mouth every 6 (six) hours as needed for severe pain.   Yes [provider]  ipratropium-albuterol (DUONEB) 0.5-2.5 (3) MG/3ML SOLN Take 3 mLs by nebulization every 4 (four) hours as needed (SOB).    Yes [provider]  Multiple Vitamins-Minerals (CENTRUM SILVER ULTRA WOMENS PO) Take 1 tablet by mouth daily.    Yes [provider]  naproxen sodium (ALEVE) 220 MG tablet Take 440 mg by mouth 3 (three) times daily as needed (pain).    Yes [provider]  polyethylene glycol (MIRALAX / GLYCOLAX) packet Take 17 g by mouth daily. 12/06/17  Yes Debbe Odea, MD  promethazine (PHENERGAN) 12.5 MG tablet Take 12.5 mg by mouth every 6 (six) hours as needed for nausea or vomiting.   Yes [provider]  RELISTOR 150 MG TABS Take 3 tablets by mouth daily. 01/04/18  Yes [provider]  vitamin B-12 (CYANOCOBALAMIN) 1000 MCG tablet Take 1,000 mcg by mouth daily.   Yes [provider]   Physical Exam: Blood pressure (!) 153/92, pulse 88, temperature 97.8 F (36.6 C), temperature source Oral, resp. rate 14, height 5\' 3"  (1.6 m), weight 45.4 kg, SpO2 (!) 86 %. 1. General:  in No Acute distress  Chronically ill  cachectic   -appearing 2. Psychological: Alert and  Oriented 3. Head/ENT:     Dry Mucous Membranes                          Head Non traumatic, neck supple                           Poor Dentition 4. SKIN:   decreased Skin turgor,  Skin clean Dry extensive excoriations          5. Heart:  Regular rate and rhythm no  Murmur, no Rub or gallop 6. Lungs:  Clear to auscultation bilaterally, no wheezes some crackles   7. Abdomen: Soft,  non-tender, Non distended * bowel sounds present 8. Lower extremities: no clubbing, cyanosis, 3+L>R edema 9. Neurologically Grossly intact, moving all 4 extremities equally   10. MSK: Normal range of motion   LABS:     Recent Labs  Lab 02/28/18 1527  WBC 8.0  HGB 14.4  HCT 46.5*  MCV 95.1  PLT 829   Basic Metabolic Panel: Recent Labs  Lab 02/27/18 1545 02/28/18 1527  NA 130* 129*  K 5.5* 5.1  CL 91* 88*  CO2 36* 32  GLUCOSE 99 91  BUN 23 17  CREATININE 0.53 0.38*  CALCIUM 8.7 8.0*      Recent Labs  Lab 02/27/18 1545 02/28/18 1529  AST 27 28  ALT 20  25  ALKPHOS 138* 113  BILITOT 0.4 0.5  PROT 5.8* 5.6*  ALBUMIN 3.5 3.2*   Recent Labs  Lab 02/27/18 1545  LIPASE 6.0*   No results for input(s): AMMONIA in the last 168 hours.    HbA1C: No results for input(s): HGBA1C in the last 72 hours. CBG: No results for input(s): GLUCAP in the last 168 hours.    Urine analysis:    Component Value Date/Time   COLORURINE YELLOW 02/28/2018 1529   APPEARANCEUR HAZY (A) 02/28/2018 1529   LABSPEC 1.012 02/28/2018 1529   PHURINE 7.0 02/28/2018 1529   GLUCOSEU NEGATIVE 02/28/2018 1529   HGBUR NEGATIVE 02/28/2018 1529   BILIRUBINUR NEGATIVE 02/28/2018 1529   BILIRUBINUR negative 04/28/2017 1621   KETONESUR NEGATIVE 02/28/2018 1529   PROTEINUR NEGATIVE 02/28/2018 1529   UROBILINOGEN 0.2 04/28/2017 1621   NITRITE NEGATIVE 02/28/2018 1529   LEUKOCYTESUR MODERATE (A) 02/28/2018 1529       Cultures: No results found for: SDES, SPECREQUEST, CULT, REPTSTATUS   Radiological Exams on Admission: Dg Chest 2 View  Result Date: 02/28/2018 CLINICAL DATA:  COPD and hyponatremia EXAM: CHEST - 2 VIEW COMPARISON:  01/11/2018 FINDINGS: Cardiac shadow is stable. Aortic calcifications are seen as well as tortuosity stable from the  previous exam. Small new effusions are seen bilaterally. Mild increased vascular congestion is noted when compare with the prior exam. Old sternal fractures are noted multiple compression fractures are seen in the thoracic spine stable from the previous exam. No new focal abnormality is seen. IMPRESSION: Changes of mild CHF with vascular congestion and small effusions. Electronically Signed   By: Inez Catalina M.D.   On: 02/28/2018 16:28    Chart has been reviewed    Assessment/Plan  72 y.o. female with medical history significant of protein-calorie malnourishment and remote history of Tina Bailey in remission.  severe osteoporosis with multiple fractures     Admitted for fluid overload hyponatremia likely CHF exacerbation  Present on Admission: Acute respiratory failure with hypoxia (HCC) -in the setting of fluid overload improved with Lasix but patient still requires oxygen which is new from baseline. Monitor on telemetry  . Chronic pain -patient states Dilaudid is too strong for her and making her confused will try to switch to Percocet and see if it will help Make sure patient is on bowel regimen  . Malnutrition of moderate degree check prealbumin order nutritional consult   . Hyponatremia -in the setting of fluid overload we will diurese obtain urine electrolytes initiate fluid restriction   . Fluid overload likely new onset CHF will obtain echogram cycle cardiac enzymes monitor on telemetry diuresis with Lasix and monitor fluid status provide oxygen as needed   . Rash -unclear etiology could be potential drug reaction versus  insect bites versus dry skin with excoriations.  ~Use starting on triamcinolone cream and a small area to see if there is any improvement  . COPD (chronic obstructive pulmonary disease) (HCC) -  Currently no wheezing continue home medications less likely to be contributing to acute respiratory failure  . Tobacco abuse -  - Spoke about importance of quitting spent 5  minutes discussing options for treatment, prior attempts at quitting, and dangers of smoking  -At this point patient is   interested in quitting she is doing it by herself by decreasing the number of cigarettes per day    - nursing tobacco cessation protocol    . Hepatocellular carcinoma ([place]) - in remission follow-up with oncology as needed . Pancreatic mass -  no evidence of pancreatic mass in October 2019.  Imaging  Other plan as per orders.  DVT prophylaxis:   Lovenox     Code Status:       DNR/DNI  as per patient   I had personally discussed CODE STATUS with patient   Family Communication:   Family not at  Bedside    Disposition Plan:       To home once workup is complete and patient is stable                                     Nutrition    consulted                Consults called: If echogram showing evidence of CHF would benefit from cardiology follow-up  Admission status:  inpatient     Expect 2 midnight stay secondary to severity of patient's current illness including   hemodynamic instability despite optimal treatment (  hypoxia)   Severe lab/radiological abnormalities including:   hyponatremia and extensive comorbidities including:   COPD   liver disease   malignancy,   That are currently affecting medical management.  I expect  patient to be hospitalized for 2 midnights requiring inpatient medical care.  Patient is at high risk for adverse outcome (such as loss of life or disability) if not treated.  Indication for inpatient stay as follows:  Hemodynamic instability despite maximal medical therapy,        Need for  IV medications    Level of care    tele  For 12H      Tina Bailey 02/28/2018, 9:19 PM    [facility]  Pager [phone]   after 2 AM please page floor coverage PA If 7AM-7PM, please contact the day team taking care of the patient  [email or web address]  Password [email or web address]

## 2018-02-28 NOTE — ED Notes (Signed)
Attempt to call report,  per [facility] floor not ready to receive.

## 2018-02-28 NOTE — ED Provider Notes (Signed)
  Face-to-face evaluation   History: She is here for evaluation of peripheral edema and low sodium.  She saw her PCP yesterday, he sent blood work prior to changing her medications, then today sent her here for evaluation of low sodium.  Patient has had more trouble breathing recently, and notices dyspnea on exertion.  She does not know she has gained weight.  She is using compression stockings and trying to elevate her legs.  She has not modified her salt intake.  Physical exam: Frail elderly female.  Alert, calm.  No respiratory distress.  Oxygen saturation 99% while on nasal cannula oxygen supplementation.  She has JVD.  Lungs with rhonchi versus rales, and decreased air movement at bases.  She has 3+ pitting edema bilateral lower legs.   Medical screening examination/treatment/procedure(s) were conducted as a shared visit with non-physician practitioner(s) and myself.  I personally evaluated the patient during the encounter   Daleen Bo, MD 02/28/18 2110

## 2018-02-28 NOTE — ED Triage Notes (Addendum)
Pt here with daughter. Pt was seen at PCP yesterday where she had labs drawn. Pt was told her sodium was critically low and needed to come to the ER. Pt has pitting edema. Pt's skin is weeping as well.   Pt's lower legs have been swollen for 3 weeks. Pt states that after she had hip surgery and they have been swollen since then. Pt has been elevating legs and using compression hose without success. Pt is usually on room air, but had sats in the upper 70s. Pt placed on 3L with improvement to 98%

## 2018-02-28 NOTE — ED Notes (Signed)
Patient has been taken off of O2 to see how well or how poorly patient maintains O2 Saturation.

## 2018-02-28 NOTE — ED Notes (Signed)
ED TO INPATIENT HANDOFF REPORT  Name/Age/Gender Tina Bailey 72 y.o. female  Code Status Code Status History    Date Active Date Inactive Code Status Order ID Comments User Context   12/04/2017 2034 12/06/2017 2135 Full Code [id number]  Rod Can, MD Inpatient   12/21/2016 2217 12/22/2016 1944 Full Code [id number]  Etta Quill, DO ED   03/04/2015 1602 03/13/2015 1354 Full Code [id number]  Stark Klein, MD Inpatient    Advance Directive Documentation     Most Recent Value  Type of Advance Directive  Healthcare Power of Attorney, Living will  Pre-existing out of facility DNR order (yellow form or pink MOST form)  -  "MOST" Form in Place?  -      Home/SNF/Other Home  Chief Complaint leg swelling   Level of Care/Admitting Diagnosis ED Disposition    ED Disposition Condition [place]: [place] [[id number]]  Level of Care: Telemetry [5]  Admit to tele based on following criteria: Other see comments  Comments: heart failure  Diagnosis: Fluid overload [932671]  Admitting Physician: Toy Baker [[id number]]  Attending Physician: Toy Baker [[id number]]  Estimated length of stay: 3 - 4 days  Certification:: I certify this patient will need inpatient services for at least 2 midnights  PT Class (Do Not Modify): Inpatient [101]  PT Acc Code (Do Not Modify): Private [1]       Medical History Past Medical History:  Diagnosis Date  . Arthritis    DDD. Right shoulder"is frozen"-limited ROM. osteoporosis.  . Blue toe syndrome ([place]) 11/27/2014   Dr. Claudia Pollock evaluating  . Cancer ([place]) 12/2014   liver cancer  . Complication of anesthesia   . COPD (chronic obstructive pulmonary disease) ([place])   . Fibromyalgia   . Fracture of rib of right side    hx "osteoporosis"- states her dog nudge her on the side, next day developed great pain and was told has a fracture rib.  . Macular degeneration    R eye  . Osteoporosis   . PONV  (postoperative nausea and vomiting)    nausea,severe vomiting after 01-13-15 portal vein embolization  . Productive cough   . Retina disorder    L eye, vision distorted, edema  . Spine fracture due to birth trauma   . TMJ disease   . Wears glasses     Allergies Allergies  Allergen Reactions  . Sertraline Hcl     Zoloft, had a terrible reaction  . Amoxicillin-Pot Clavulanate Nausea Only    Has patient had a PCN reaction causing immediate rash, facial/tongue/throat swelling, SOB or lightheadedness with hypotension: No Has patient had a PCN reaction causing severe rash involving mucus membranes or skin necrosis: No Has patient had a PCN reaction that required hospitalization No Has patient had a PCN reaction occurring within the last 10 years: No If all of the above answers are "NO", then may proceed with Cephalosporin use.   . Clindamycin/Lincomycin Other (See Comments)    Felt like it "burned out" her stomach/took a large dose  . Paroxetine Hcl Rash  . Sulfa Antibiotics Rash    IV Location/Drains/Wounds Patient Lines/Drains/Airways Status   Active Line/Drains/Airways    Name:   Placement date:   Placement time:   Site:   Days:   Peripheral IV 02/28/18 Left;Upper;Lateral Forearm   02/28/18    1755    Forearm   less than 1   Incision (Closed) 12/04/17 Thigh Right   12/04/17  1848     59          Labs/Imaging Results for orders placed or performed during the hospital encounter of 02/28/18 (from the past 48 hour(s))  Basic metabolic panel     Status: Abnormal   Collection Time: 02/28/18  3:27 PM  Result Value Ref Range   Sodium 129 (L) 135 - 145 mmol/L   Potassium 5.1 3.5 - 5.1 mmol/L   Chloride 88 (L) 98 - 111 mmol/L   CO2 32 22 - 32 mmol/L   Glucose, Bld 91 70 - 99 mg/dL   BUN 17 8 - 23 mg/dL   Creatinine, Ser 0.38 (L) 0.44 - 1.00 mg/dL   Calcium 8.0 (L) 8.9 - 10.3 mg/dL   GFR calc non Af Amer >60 >60 mL/min   GFR calc Af Amer >60 >60 mL/min   Anion gap 9 5 - 15     Comment: Performed at [facility], [place] [address], [place], [place] [address]  CBC     Status: Abnormal   Collection Time: 02/28/18  3:27 PM  Result Value Ref Range   WBC 8.0 4.0 - 10.5 K/uL   RBC 4.89 3.87 - 5.11 MIL/uL   Hemoglobin 14.4 12.0 - 15.0 g/dL   HCT 46.5 (H) 36.0 - 46.0 %   MCV 95.1 80.0 - 100.0 fL   MCH 29.4 26.0 - 34.0 pg   MCHC 31.0 30.0 - 36.0 g/dL   RDW 13.8 11.5 - 15.5 %   Platelets 221 150 - 400 K/uL   nRBC 0.0 0.0 - 0.2 %    Comment: Performed at [facility], [place] [address] [place], [place] [address]  Hepatic function panel     Status: Abnormal   Collection Time: 02/28/18  3:29 PM  Result Value Ref Range   Total Protein 5.6 (L) 6.5 - 8.1 g/dL   Albumin 3.2 (L) 3.5 - 5.0 g/dL   AST 28 15 - 41 U/L   ALT 25 0 - 44 U/L   Alkaline Phosphatase 113 38 - 126 U/L   Total Bilirubin 0.5 0.3 - 1.2 mg/dL   Bilirubin, Direct 0.2 0.0 - 0.2 mg/dL   Indirect Bilirubin 0.3 0.3 - 0.9 mg/dL    Comment: Performed at [facility], [place] [address], [place], [place] [address]  Urinalysis, Routine w reflex microscopic     Status: Abnormal   Collection Time: 02/28/18  3:29 PM  Result Value Ref Range   Color, Urine YELLOW YELLOW   APPearance HAZY (A) CLEAR   Specific Gravity, Urine 1.012 1.005 - 1.030   pH 7.0 5.0 - 8.0   Glucose, UA NEGATIVE NEGATIVE mg/dL   Hgb urine dipstick NEGATIVE NEGATIVE   Bilirubin Urine NEGATIVE NEGATIVE   Ketones, ur NEGATIVE NEGATIVE mg/dL   Protein, ur NEGATIVE NEGATIVE mg/dL   Nitrite NEGATIVE NEGATIVE   Leukocytes, UA MODERATE (A) NEGATIVE   RBC / HPF 0-5 0 - 5 RBC/hpf   WBC, UA 11-20 0 - 5 WBC/hpf   Bacteria, UA FEW (A) NONE SEEN   Squamous Epithelial / LPF 6-10 0 - 5   Mucus PRESENT    Budding Yeast PRESENT    Amorphous Crystal PRESENT     Comment: Performed at [facility], [place] [address] [place], [place] [address]  Brain natriuretic peptide     Status:  Abnormal   Collection Time: 02/28/18  3:29 PM  Result Value Ref Range   B Natriuretic Peptide  772.7 (H) 0.0 - 100.0 pg/mL    Comment: Performed at [facility], [place] [address], [place], [place] [address]  I-stat troponin, ED     Status: None   Collection Time: 02/28/18  3:48 PM  Result Value Ref Range   Troponin i, poc 0.02 0.00 - 0.08 ng/mL   Comment 3            Comment: Due to the release kinetics of cTnI, a negative result within the first hours of the onset of symptoms does not rule out myocardial infarction with certainty. If myocardial infarction is still suspected, repeat the test at appropriate intervals.    Dg Chest 2 View  Result Date: 02/28/2018 CLINICAL DATA:  COPD and hyponatremia EXAM: CHEST - 2 VIEW COMPARISON:  01/11/2018 FINDINGS: Cardiac shadow is stable. Aortic calcifications are seen as well as tortuosity stable from the previous exam. Small new effusions are seen bilaterally. Mild increased vascular congestion is noted when compare with the prior exam. Old sternal fractures are noted multiple compression fractures are seen in the thoracic spine stable from the previous exam. No new focal abnormality is seen. IMPRESSION: Changes of mild CHF with vascular congestion and small effusions. Electronically Signed   By: Inez Catalina M.D.   On: 02/28/2018 16:28   EKG Interpretation  Date/Time:  Wednesday February 28 2018 15:37:12 EST Ventricular Rate:  94 PR Interval:    QRS Duration: 77 QT Interval:  348 QTC Calculation: 436 R Axis:   104 Text Interpretation:  Sinus rhythm Anterior infarct, old Nonspecific T abnormalities, lateral leads Baseline wander in lead(s) V2 Since last tracing Nonspecific T wave abnormality Anterior leads Confirmed by Daleen Bo [phone]) on 02/28/2018 3:58:19 PM   Pending Labs Unresulted Labs (From admission, onward)    Start     Ordered   02/28/18 2054  Urine Culture  Once,   R     02/28/18 2053   02/28/18 2036   Troponin I - Now Then Q6H  Now then every 6 hours,   R     02/28/18 2035   Signed and Held  Magnesium  Tomorrow morning,   R    Comments:  Call MD if <1.5    Signed and Held   Signed and Held  Phosphorus  Tomorrow morning,   R     Signed and Held   Signed and Held  TSH  Once,   R    Comments:  Cancel if already done within 1 month and notify MD    Signed and Held   Signed and Held  Comprehensive metabolic panel  Once,   R    Comments:  Cal MD for K<3.5 or >5.0    Signed and Held   Signed and Held  CBC  Once,   R    Comments:  Call for hg <8.0    Signed and Held   Signed and Held  Prealbumin  Tomorrow morning,   R     Signed and Held          Vitals/Pain Today's Vitals   02/28/18 1831 02/28/18 1832 02/28/18 1833 02/28/18 2107  BP:    131/75  Pulse:    83  Resp:    10  Temp:      TempSrc:      SpO2: (!) 86% (!) 86% (!) 86% 100%  Weight:      Height:      PainSc:        Isolation  Precautions No active isolations  Medications Medications  oxyCODONE-acetaminophen (PERCOCET/ROXICET) 5-325 MG per tablet 1 tablet (has no administration in time range)  furosemide (LASIX) injection 40 mg (40 mg Intravenous Given 02/28/18 1756)    Mobility walks

## 2018-03-01 ENCOUNTER — Inpatient Hospital Stay (HOSPITAL_COMMUNITY): Payer: Medicare Other

## 2018-03-01 DIAGNOSIS — I5033 Acute on chronic diastolic (congestive) heart failure: Principal | ICD-10-CM

## 2018-03-01 DIAGNOSIS — C220 Liver cell carcinoma: Secondary | ICD-10-CM

## 2018-03-01 DIAGNOSIS — E43 Unspecified severe protein-calorie malnutrition: Secondary | ICD-10-CM

## 2018-03-01 DIAGNOSIS — E871 Hypo-osmolality and hyponatremia: Secondary | ICD-10-CM

## 2018-03-01 DIAGNOSIS — R609 Edema, unspecified: Secondary | ICD-10-CM

## 2018-03-01 DIAGNOSIS — J438 Other emphysema: Secondary | ICD-10-CM

## 2018-03-01 DIAGNOSIS — G894 Chronic pain syndrome: Secondary | ICD-10-CM

## 2018-03-01 DIAGNOSIS — J9601 Acute respiratory failure with hypoxia: Secondary | ICD-10-CM

## 2018-03-01 DIAGNOSIS — Z720 Tobacco use: Secondary | ICD-10-CM

## 2018-03-01 LAB — CBC
HCT: 49.6 % — ABNORMAL HIGH (ref 36.0–46.0)
Hemoglobin: 15.1 g/dL — ABNORMAL HIGH (ref 12.0–15.0)
MCH: 28.7 pg (ref 26.0–34.0)
MCHC: 30.4 g/dL (ref 30.0–36.0)
MCV: 94.3 fL (ref 80.0–100.0)
Platelets: 229 10*3/uL (ref 150–400)
RBC: 5.26 MIL/uL — ABNORMAL HIGH (ref 3.87–5.11)
RDW: 13.7 % (ref 11.5–15.5)
WBC: 5.6 10*3/uL (ref 4.0–10.5)
nRBC: 0 % (ref 0.0–0.2)

## 2018-03-01 LAB — COMPREHENSIVE METABOLIC PANEL
ALT: 24 U/L (ref 0–44)
AST: 27 U/L (ref 15–41)
Albumin: 3.2 g/dL — ABNORMAL LOW (ref 3.5–5.0)
Alkaline Phosphatase: 119 U/L (ref 38–126)
Anion gap: 8 (ref 5–15)
BUN: 13 mg/dL (ref 8–23)
CO2: 37 mmol/L — ABNORMAL HIGH (ref 22–32)
Calcium: 8 mg/dL — ABNORMAL LOW (ref 8.9–10.3)
Chloride: 86 mmol/L — ABNORMAL LOW (ref 98–111)
Creatinine, Ser: 0.46 mg/dL (ref 0.44–1.00)
GFR calc Af Amer: >60 mL/min (ref >60)
GFR calc non Af Amer: >60 mL/min (ref >60)
Glucose, Bld: 100 mg/dL — ABNORMAL HIGH (ref 70–99)
Potassium: 4.3 mmol/L (ref 3.5–5.1)
Sodium: 131 mmol/L — ABNORMAL LOW (ref 135–145)
Total Bilirubin: 0.6 mg/dL (ref 0.3–1.2)
Total Protein: 5.6 g/dL — ABNORMAL LOW (ref 6.5–8.1)

## 2018-03-01 LAB — TSH: TSH: 1.262 u[IU]/mL (ref 0.350–4.500)

## 2018-03-01 LAB — MAGNESIUM: Magnesium: 2.1 mg/dL (ref 1.7–2.4)

## 2018-03-01 LAB — TROPONIN I
Troponin I: 0.06 ng/mL (ref <0.03)
Troponin I: 0.06 ng/mL

## 2018-03-01 LAB — PREALBUMIN: Prealbumin: 11.2 mg/dL — ABNORMAL LOW (ref 18–38)

## 2018-03-01 LAB — PHOSPHORUS: Phosphorus: 3.7 mg/dL (ref 2.5–4.6)

## 2018-03-01 MED ORDER — BUSPIRONE HCL 5 MG PO TABS
5.0000 mg | ORAL_TABLET | Freq: Two times a day (BID) | ORAL | Status: DC
  Administered 2018-03-02 – 2018-03-06 (×10): 5 mg via ORAL
  Filled 2018-03-01 (×10): qty 1

## 2018-03-01 MED ORDER — VITAMIN B-1 100 MG PO TABS
100.0000 mg | ORAL_TABLET | Freq: Every day | ORAL | Status: DC
  Administered 2018-03-02 – 2018-03-06 (×5): 100 mg via ORAL
  Filled 2018-03-01 (×5): qty 1

## 2018-03-01 NOTE — Progress Notes (Addendum)
Initial Nutrition Assessment  DOCUMENTATION CODES:   Severe malnutrition in context of chronic illness, Underweight  INTERVENTION:  - Will order Ensure Enlive BID, each supplement provides 350 kcal and 20 grams of protein. - Will order daily multivitamin with minerals. - Continue to encourage PO intakes.    NUTRITION DIAGNOSIS:   Severe Malnutrition related to chronic illness(COPD) as evidenced by severe fat depletion, severe muscle depletion.  GOAL:   Patient will meet greater than or equal to 90% of their needs  MONITOR:   PO intake, Supplement acceptance, Weight trends, Labs  REASON FOR ASSESSMENT:   Consult, Malnutrition Screening Tool Assessment of nutrition requirement/status  ASSESSMENT:   72 y.o female with a PMH of COPD, fibromyalgia, arthritis, HCC in remission, and osteoporosis. Presented to the ED with a chief complaint of bilateral leg swelling x3 weeks. Patient was also seen by her PCP the day PTA for lab draw and was called on the day of admission and told "her sodium is critically low and she needs to seek medical attention in the ED for diuresis."  No intakes documented since admission. When RD entered the room patient reported severe pain when lying on her back and that she was trying to get more comfortable. Provided patient with an additional pillow, which was at bedside, and put bed all the way to the ground and laid bed flat, per patient request. Unable to obtain any nutrition-related information d/t patient's focus on current discomfort and disinterest in having a full conversation. No family/visitors at bedside.  Per chart review, current weight is 98 lb which is up compared to weight as of September 2019. Notes indicate patient had +3 edema to BLE on admission.    Medication reviewed; 40 mg IV Lasix BID, 1 packet Miralax/day. Labs reviewed; Na: 131 mmol/L, Cl: 86 mmol/L, Ca: 8 mg/dL.      NUTRITION - FOCUSED PHYSICAL EXAM:    Most Recent Value   Orbital Region  Moderate depletion  Upper Arm Region  Severe depletion  Thoracic and Lumbar Region  Severe depletion  Buccal Region  Moderate depletion  Temple Region  Unable to assess  Clavicle Bone Region  Severe depletion  Clavicle and Acromion Bone Region  Severe depletion  Scapular Bone Region  Severe depletion  Dorsal Hand  Moderate depletion  Patellar Region  Unable to assess  Anterior Thigh Region  Unable to assess  Posterior Calf Region  Unable to assess  Edema (RD Assessment)  Unable to assess  Hair  Reviewed  Eyes  Reviewed  Mouth  Reviewed  Skin  Reviewed  Nails  Reviewed       Diet Order:   Diet Order            Diet Heart Room service appropriate? Yes; Fluid consistency: Thin  Diet effective now              EDUCATION NEEDS:   Not appropriate for education at this time  Skin:  Skin Assessment: Reviewed RN Assessment  Last BM:  12/11  Height:   Ht Readings from Last 1 Encounters:  03/01/18 5\' 3"  (1.6 m)    Weight:   Wt Readings from Last 1 Encounters:  03/01/18 44.7 kg    Ideal Body Weight:  52.27 kg  BMI:  Body mass index is 17.46 kg/m.  Estimated Nutritional Needs:   Kcal:  1650-1850 kcal  Protein:  70-80 grams  Fluid:  >/= 1.2 L/day     Jarome Matin, MS, RD, LDN, CNSC  Inpatient Clinical Dietitian Pager # [phone] After hours/weekend pager # [phone]

## 2018-03-01 NOTE — Evaluation (Addendum)
Physical Therapy Evaluation Patient Details Name: LYNIAH FUJITA MRN: [id number] DOB: Aug 01, 1945 Today's Date: 03/01/2018   History of Present Illness  72 yo female admitted with leg edema, acute respiratory failure, CHF exac. hx of R femur fx-s/p IM nail 11/2016, multiple compression fxs, HCC, COPD, macular degeneration, severe osteoporosis  Clinical Impression  On eval, pt required Min assist for mobility. She was able to stand and take a few steps along the side of the bed. She c/o back pain and fatigue-"I feel drugged up. I just need to rest." Remained on [place] O2. Assisted pt back to bed at her request. No family present during session. Will follow and progress activity as tolerated. If pt does not have 24 hour care available, may need to consider SNF.     Follow Up Recommendations Home health PT;Supervision/Assistance - 24 hour (SNF if pt will not have 24/7 care)    Equipment Recommendations  None recommended by PT    Recommendations for Other Services       Precautions / Restrictions Precautions Precautions: Fall Restrictions Weight Bearing Restrictions: No      Mobility  Bed Mobility Overal bed mobility: Needs Assistance Bed Mobility: Sidelying to Sit;Sit to Sidelying   Sidelying to sit: Min assist     Sit to sidelying: Min guard General bed mobility comments: Assist for trunk to upright. Increased time.   Transfers Overall transfer level: Needs assistance Equipment used: None Transfers: Sit to/from Stand Sit to Stand: Min assist         General transfer comment: Assist to rise, stabilize, control descent. Increased time.   Ambulation/Gait Ambulation/Gait assistance: Min assist           General Gait Details: side steps along side of bed x 4. Assist to steady. Remained on  O2  Stairs            Wheelchair Mobility    Modified Rankin (Stroke Patients Only)       Balance Overall balance assessment: Needs assistance;History of Falls           Standing balance-Leahy Scale: Poor                               Pertinent Vitals/Pain Pain Assessment: Faces Faces Pain Scale: Hurts even more Pain Location: back Pain Descriptors / Indicators: Aching;Discomfort Pain Intervention(s): Limited activity within patient's tolerance;Repositioned    Home Living Family/patient expects to be discharged to:: Unsure Living Arrangements: Children Available Help at Discharge: Family;Available 24 hours/day Type of Home: House Home Access: Stairs to enter Entrance Stairs-Rails: Right Entrance Stairs-Number of Steps: 2 Home Layout: One level Home Equipment: Walker - 4 wheels;Tub bench      Prior Function Level of Independence: Independent with assistive device(s)         Comments: using RW for ambulation     Hand Dominance        Extremity/Trunk Assessment   Upper Extremity Assessment Upper Extremity Assessment: Generalized weakness    Lower Extremity Assessment Lower Extremity Assessment: Generalized weakness    Cervical / Trunk Assessment Cervical / Trunk Assessment: Kyphotic  Communication   Communication: No difficulties  Cognition Arousal/Alertness: Awake/alert Behavior During Therapy: WFL for tasks assessed/performed Overall Cognitive Status: Within Functional Limits for tasks assessed  General Comments      Exercises     Assessment/Plan    PT Assessment Patient needs continued PT services  PT Problem List Decreased strength;Decreased balance;Decreased mobility;Decreased activity tolerance;Pain;Decreased knowledge of use of DME       PT Treatment Interventions DME instruction;Gait training;Functional mobility training;Therapeutic activities;Balance training;Patient/family education;Therapeutic exercise    PT Goals (Current goals can be found in the Care Plan section)  Acute Rehab PT Goals Patient Stated Goal: less pain. to get  some rest.  PT Goal Formulation: With patient Time For Goal Achievement: 03/15/18 Potential to Achieve Goals: Good    Frequency Min 3X/week   Barriers to discharge        Co-evaluation               AM-PAC PT "6 Clicks" Mobility  Outcome Measure Help needed turning from your back to your side while in a flat bed without using bedrails?: None Help needed moving from lying on your back to sitting on the side of a flat bed without using bedrails?: A Little Help needed moving to and from a bed to a chair (including a wheelchair)?: A Little Help needed standing up from a chair using your arms (e.g., wheelchair or bedside chair)?: A Little Help needed to walk in hospital room?: A Little Help needed climbing 3-5 steps with a railing? : A Little 6 Click Score: 19    End of Session Equipment Utilized During Treatment: Oxygen Activity Tolerance: Patient limited by fatigue;Patient limited by pain Patient left: in bed;with call bell/phone within reach;with bed alarm set   PT Visit Diagnosis: Muscle weakness (generalized) (M62.81);Difficulty in walking, not elsewhere classified (R26.2);Pain;Unsteadiness on feet (R26.81);History of falling (Z91.81) Pain - part of body: (back)    Time: 1135-1145 PT Time Calculation (min) (ACUTE ONLY): 10 min   Charges:   PT Evaluation $PT Eval Moderate Complexity: 1 Mod            Weston Anna, PT Acute Rehabilitation Services Pager: [phone] Office: [phone]  '

## 2018-03-01 NOTE — Care Management Note (Signed)
Case Management Note  Patient Details  Name: Tina Bailey MRN: [id number] Date of Birth: 10-Jul-1945  Subjective/Objective:  Hepatocellular Ca. From home with family. Spoke to patient in rm-frail, slow to respond, due to having to think of her response-states she may have to live somewhere else later down the road after d/c. CM delicately expressed that CM tries to help with d/c plan before d/c so have a smooth transition to where you want to go-patient states she plans to go home. PT recc HH-provided with [place] agency list-await choice.Noted on 02-patient stated she does not use it @ home-not sure how accurate this is.XB:LTJQ.                  Action/Plan:d/c plan home w/HHC.   Expected Discharge Date:  (unknown)               Expected Discharge Plan:  [place]  In-House Referral:     Discharge planning Services  CM Consult  Post Acute Care Choice:  Durable Medical Equipment(rw) Choice offered to:  Patient  DME Arranged:    DME Agency:     HH Arranged:    [place] Agency:     Status of Service:  In process, will continue to follow  If discussed at Long Length of Stay Meetings, dates discussed:    Additional Comments:  Dessa Phi, RN 03/01/2018, 1:38 PM

## 2018-03-01 NOTE — Progress Notes (Signed)
PROGRESS NOTE  Tina Bailey [id number]:[id number] DOB: 1946/02/15 DOA: 02/28/2018 PCP: Midge Minium, MD  HPI/Recap of past 24 hours:  C/o chronic generalized pain, due to chronic fractures She reports oxycodone  Masses up her mind She reports being constipated She remain edematous, she is on o2 supplement, she report at baseline not dependent on oxygen  Assessment/Plan: Active Problems:   Pancreatic mass   Hepatocellular carcinoma (HCC)   Chronic pain   Malnutrition of moderate degree   Severe protein-calorie malnutrition (HCC)   Hyponatremia   Fluid overload   Rash   COPD (chronic obstructive pulmonary disease) (HCC)   Tobacco abuse   Acute respiratory failure with hypoxia (HCC)   Acute respiratory failure with hypoxia (HCC) -cxr on presentation "Changes of mild CHF with vascular congestion and small effusions." -she has Bilateral lower extremity edema (patient came to the hospital due to progressive bilateral lower extremity edema for the last 3 weeks difficulty ambulating), venous Doppler no DVT -Echocardiogram pending -Troponin mild and flat at 0.06, BNP elevated at 372 -Continue IV Lasix 40 mg twice a day as long as blood pressure and renal function allows -Strict intake and output, daily weight  Hyponatremia -Sodium 129 on presentation -Likely from volume overload -Continue Lasix -Repeat labs in the morning  COPD/smoker Reports not on home o2 Currently no wheezing Smoking cessation education provided  H/o [facility] s/p resection with negative margin, under survillence  Severe malnutrition in context of chronic illness, Underweight -Nutrition input appreciated  Advanced osteoporosis/multiple chronic vertebral compression fracture/h/o of right femur fracture status post ORIF/multiple rib fractures/h/o sternum fracture/ chronic pain -She is recently started on Evenity monthly injection ,so far she received the second dose -He is also on vitamin D and calcium  supplement -She is followed with pain clinic  Chronic constipation -Patient declined Relistor today, agreed with MiraLAX and Senokot for now  Depression/anxiety -tsh nwl -She has been on Wellbutrin which helped depression -Daughter who patient lives with for the last 10 years request some anger anxiety medication -will try low-dose BuSpar -Advised follow-up with psychiatry  FTT Family is working on getting her to ALF  I have reviewed telemetry has been remarkable in sinus rhythm , will DC telemetry  Code Status: DNR  Family Communication: patient and daughter over the phone with permission  Disposition Plan: home with home health in 1-2 days   Consultants:  none  Procedures:  none  Antibiotics:  none   Objective: BP (!) 144/81 (BP Location: Right Arm)   Pulse 84   Temp 97.6 F (36.4 C) (Oral)   Resp 16   Ht 5\' 3"  (1.6 m)   Wt 44.7 kg   SpO2 94%   BMI 17.46 kg/m   Intake/Output Summary (Last 24 hours) at 03/01/2018 1337 Last data filed at 03/01/2018 8341 Gross per 24 hour  Intake 120 ml  Output 2100 ml  Net -1980 ml   Filed Weights   02/28/18 1440 03/01/18 0545  Weight: 45.4 kg 44.7 kg    Exam: Patient is examined daily including today on 03/01/2018, exams remain the same as of yesterday except that has changed    General:  Very fail, chronically ill, malnourished  Cardiovascular: RRR  Respiratory: CTABL  Abdomen: Soft/ND/NT, positive BS  Musculoskeletal: bilateral lower extremity pitting Edema up to knee  Neuro: alert, oriented   Data Reviewed: Basic Metabolic Panel: Recent Labs  Lab 02/27/18 1545 02/28/18 1527 03/01/18 0601  NA 130* 129* 131*  K 5.5* 5.1 4.3  CL 91* 88* 86*  CO2 36* 32 37*  GLUCOSE 99 91 100*  BUN 23 17 13   CREATININE 0.53 0.38* 0.46  CALCIUM 8.7 8.0* 8.0*  MG  --   --  2.1  PHOS  --   --  3.7   Liver Function Tests: Recent Labs  Lab 02/27/18 1545 02/28/18 1529 03/01/18 0601  AST 27 28 27   ALT 20  25 24   ALKPHOS 138* 113 119  BILITOT 0.4 0.5 0.6  PROT 5.8* 5.6* 5.6*  ALBUMIN 3.5 3.2* 3.2*   Recent Labs  Lab 02/27/18 1545  LIPASE 6.0*   No results for input(s): AMMONIA in the last 168 hours. CBC: Recent Labs  Lab 02/28/18 1527 03/01/18 0601  WBC 8.0 5.6  HGB 14.4 15.1*  HCT 46.5* 49.6*  MCV 95.1 94.3  PLT 221 229   Cardiac Enzymes:   Recent Labs  Lab 02/28/18 2147 03/01/18 0244 03/01/18 0601  TROPONINI 0.06* 0.06* 0.06*   BNP (last 3 results) Recent Labs    02/28/18 1529  BNP 772.7*    ProBNP (last 3 results) Recent Labs    02/27/18 1545  PROBNP 749.0*    CBG: No results for input(s): GLUCAP in the last 168 hours.  No results found for this or any previous visit (from the past 240 hour(s)).   Studies: Dg Chest 2 View  Result Date: 02/28/2018 CLINICAL DATA:  COPD and hyponatremia EXAM: CHEST - 2 VIEW COMPARISON:  01/11/2018 FINDINGS: Cardiac shadow is stable. Aortic calcifications are seen as well as tortuosity stable from the previous exam. Small new effusions are seen bilaterally. Mild increased vascular congestion is noted when compare with the prior exam. Old sternal fractures are noted multiple compression fractures are seen in the thoracic spine stable from the previous exam. No new focal abnormality is seen. IMPRESSION: Changes of mild CHF with vascular congestion and small effusions. Electronically Signed   By: Inez Catalina M.D.   On: 02/28/2018 16:28   Vas [place] Lower Extremity Venous (dvt)  Result Date: 03/01/2018  Lower Venous Study Indications: Edema, and Pain.  Limitations: Body habitus and severe edema,pain and sensitive for compression. Comparison Study: 12/14/2017 venous duplex exam Performing Technologist: Rudell Cobb  Examination Guidelines: A complete evaluation includes B-mode imaging, spectral Doppler, color Doppler, and power Doppler as needed of all accessible portions of each vessel. Bilateral testing is considered an integral part  of a complete examination. Limited examinations for reoccurring indications may be performed as noted.  Right Venous Findings: +---+---------------+---------+-----------+----------+-------+    CompressibilityPhasicitySpontaneityPropertiesSummary +---+---------------+---------+-----------+----------+-------+ CFVFull           Yes      Yes                          +---+---------------+---------+-----------+----------+-------+  Left Venous Findings: +---------+---------------+---------+-----------+----------+-------+          CompressibilityPhasicitySpontaneityPropertiesSummary +---------+---------------+---------+-----------+----------+-------+ CFV      Full           Yes      Yes                          +---------+---------------+---------+-----------+----------+-------+ SFJ      Full                                                 +---------+---------------+---------+-----------+----------+-------+  FV Prox  Full                                                 +---------+---------------+---------+-----------+----------+-------+ FV Mid   Full                                                 +---------+---------------+---------+-----------+----------+-------+ FV DistalFull                                                 +---------+---------------+---------+-----------+----------+-------+ PFV      Full                                                 +---------+---------------+---------+-----------+----------+-------+ POP      Full           Yes      Yes                          +---------+---------------+---------+-----------+----------+-------+ PTV      Full                                                 +---------+---------------+---------+-----------+----------+-------+ PERO     Full                                                 +---------+---------------+---------+-----------+----------+-------+    Summary: Right: No evidence of  common femoral vein obstruction. Left: There is no evidence of deep vein thrombosis in the lower extremity. However, portions of this examination were limited- see technologist comments above. No cystic structure found in the popliteal fossa.  *See table(s) above for measurements and observations.    Preliminary     Scheduled Meds: . buPROPion  150 mg Oral Daily  . enoxaparin (LOVENOX) injection  30 mg Subcutaneous Q24H  . furosemide  40 mg Intravenous Q12H  . Methylnaltrexone Bromide  450 mg Oral Daily  . polyethylene glycol  17 g Oral Daily  . sodium chloride flush  3 mL Intravenous Q12H    Continuous Infusions: . sodium chloride       Time spent: 53mins I have personally reviewed and interpreted on  03/01/2018 daily labs, tele strips, imagings as discussed above under date review session and assessment and plans.  I reviewed all nursing notes, pharmacy notes, consultant notes,  vitals, pertinent old records  I have discussed plan of care as described above with RN , patient and family on 03/01/2018   Florencia Reasons MD, PhD  [facility] Pager [phone]. If 7PM-7AM, please contact night-coverage at [email or web address], password [facility] 03/01/2018, 1:37 PM  LOS: 1 day

## 2018-03-01 NOTE — Progress Notes (Signed)
Left lower extremity venous duplex exam completed. Please see preliminary notes on CV PROC under chart review. Ronald Londo H Deirdra Heumann(RDMS RVT) 03/01/18 11:47 AM

## 2018-03-02 ENCOUNTER — Encounter: Payer: Self-pay | Admitting: Emergency Medicine

## 2018-03-02 ENCOUNTER — Inpatient Hospital Stay (HOSPITAL_COMMUNITY): Payer: Medicare Other

## 2018-03-02 DIAGNOSIS — I361 Nonrheumatic tricuspid (valve) insufficiency: Secondary | ICD-10-CM

## 2018-03-02 LAB — CBC WITH DIFFERENTIAL/PLATELET
Abs Immature Granulocytes: 0.03 10*3/uL (ref 0.00–0.07)
Basophils Absolute: 0 10*3/uL (ref 0.0–0.1)
Basophils Relative: 0 %
Eosinophils Absolute: 0 10*3/uL (ref 0.0–0.5)
Eosinophils Relative: 0 %
HCT: 47.2 % — ABNORMAL HIGH (ref 36.0–46.0)
Hemoglobin: 14.4 g/dL (ref 12.0–15.0)
Immature Granulocytes: 0 %
Lymphocytes Relative: 11 %
Lymphs Abs: 0.8 10*3/uL (ref 0.7–4.0)
MCH: 28.8 pg (ref 26.0–34.0)
MCHC: 30.5 g/dL (ref 30.0–36.0)
MCV: 94.4 fL (ref 80.0–100.0)
Monocytes Absolute: 0.8 10*3/uL (ref 0.1–1.0)
Monocytes Relative: 10 %
Neutro Abs: 5.6 10*3/uL (ref 1.7–7.7)
Neutrophils Relative %: 79 %
Platelets: 217 10*3/uL (ref 150–400)
RBC: 5 MIL/uL (ref 3.87–5.11)
RDW: 13.7 % (ref 11.5–15.5)
WBC: 7.2 10*3/uL (ref 4.0–10.5)
nRBC: 0 % (ref 0.0–0.2)

## 2018-03-02 LAB — BASIC METABOLIC PANEL
Anion gap: 8 (ref 5–15)
BUN: 12 mg/dL (ref 8–23)
CO2: 38 mmol/L — ABNORMAL HIGH (ref 22–32)
Calcium: 7.7 mg/dL — ABNORMAL LOW (ref 8.9–10.3)
Chloride: 87 mmol/L — ABNORMAL LOW (ref 98–111)
Creatinine, Ser: 0.34 mg/dL — ABNORMAL LOW (ref 0.44–1.00)
GFR calc Af Amer: >60 mL/min (ref >60)
GFR calc non Af Amer: >60 mL/min (ref >60)
Glucose, Bld: 99 mg/dL (ref 70–99)
Potassium: 3.9 mmol/L (ref 3.5–5.1)
Sodium: 133 mmol/L — ABNORMAL LOW (ref 135–145)

## 2018-03-02 LAB — URINE CULTURE: Culture: NO GROWTH

## 2018-03-02 LAB — ECHOCARDIOGRAM COMPLETE
Height: 63 in
Weight: 1474.44 oz

## 2018-03-02 LAB — QUANTIFERON-TB GOLD PLUS
Mitogen-NIL: 7.72 IU/mL
NIL: 0.02 IU/mL
QuantiFERON-TB Gold Plus: NEGATIVE
TB1-NIL: 0.01 IU/mL
TB2-NIL: 0.01 IU/mL

## 2018-03-02 LAB — MAGNESIUM: Magnesium: 2 mg/dL (ref 1.7–2.4)

## 2018-03-02 NOTE — Evaluation (Signed)
Occupational Therapy Evaluation Patient Details Name: Tina Bailey MRN: [id number] DOB: [birth year] Today's Date: 03/02/2018    History of Present Illness 72 yo female admitted with leg edema, acute respiratory failure, CHF exac. hx of R femur fx-s/p IM nail 11/2016, multiple compression fxs, HCC, COPD, macular degeneration, severe osteoporosis   Clinical Impression   . Pt admitted with leg edema. Pt currently with functional limitations due to the deficits listed below (see OT Problem List).  Pt will benefit from skilled OT to increase their safety and independence with ADL and functional mobility for ADL to facilitate discharge to venue listed below.  Pt lives with daughter who cant provide 24/7 A.  Pts daughter agrees she will need ST SNF prior to moving to ALS as they were planning    Follow Up Recommendations  SNF    Equipment Recommendations  None recommended by OT    Recommendations for Other Services       Precautions / Restrictions Precautions Precautions: Fall      Mobility Bed Mobility Overal bed mobility: Needs Assistance Bed Mobility: Sidelying to Sit;Sit to Sidelying   Sidelying to sit: Min assist     Sit to sidelying: Min guard General bed mobility comments: Assist for trunk to upright. Increased time.   Transfers Overall transfer level: Needs assistance Equipment used: None Transfers: Sit to/from [facility] Sit to Stand: Min assist         General transfer comment: Assist to rise, stabilize, control descent. Increased time.     Balance Overall balance assessment: Needs assistance;History of Falls           Standing balance-Leahy Scale: Poor                             ADL either performed or assessed with clinical judgement   ADL Overall ADL's : Needs assistance/impaired Eating/Feeding: Set up;Sitting   Grooming: Set up;Sitting                   Toilet Transfer: Minimal  assistance;RW;BSC;Stand-pivot   Toileting- Clothing Manipulation and Hygiene: Moderate assistance;Sit to/from stand;Cueing for sequencing;Cueing for safety         General ADL Comments: Pt overall not feeing well this day.  Daugther present for part of OT session as well as Therapist, sports. Discussed with daugther DC plan and she is looking toward ALF but pt needs to be more I so will need ST SNF to regain I as  daugther works and cant provide 24/7 A     Vision Patient Visual Report: No change from baseline              Pertinent Vitals/Pain Pain Score: 5  Pain Location: belly Pain Descriptors / Indicators: Aching;Discomfort Pain Intervention(s): Limited activity within patient's tolerance;Repositioned;Patient requesting pain meds-RN notified        Extremity/Trunk Assessment         Cervical / Trunk Assessment Cervical / Trunk Assessment: Kyphotic   Communication Communication Communication: No difficulties   Cognition Arousal/Alertness: Awake/alert Behavior During Therapy: WFL for tasks assessed/performed Overall Cognitive Status: Within Functional Limits for tasks assessed                                                Home Living Family/patient expects to be discharged to:: Private  residence Living Arrangements: Children Available Help at Discharge: Family Type of Home: House Home Access: Stairs to enter Technical brewer of Steps: 2 Entrance Stairs-Rails: Right [place]: One level     Bathroom Shower/Tub: Tub/shower unit         Home Equipment: Environmental consultant - 4 wheels;Tub bench          Prior Functioning/Environment Level of Independence: Independent with assistive device(s)        Comments: using RW for ambulation        OT Problem List: Decreased strength;Decreased activity tolerance;Decreased safety awareness;Impaired balance (sitting and/or standing);Pain      OT Treatment/Interventions: Self-care/ADL training;Patient/family  education;Therapeutic activities;DME and/or AE instruction    OT Goals(Current goals can be found in the care plan section) Acute Rehab OT Goals Patient Stated Goal: less pain. to get some rest.  OT Goal Formulation: With patient Time For Goal Achievement: 03/09/18 Potential to Achieve Goals: Good  OT Frequency: Min 2X/week   Barriers to D/C:               AM-PAC OT "6 Clicks" Daily Activity     Outcome Measure Help from another person eating meals?: None Help from another person taking care of personal grooming?: A Little Help from another person toileting, which includes using toliet, bedpan, or urinal?: A Little Help from another person bathing (including washing, rinsing, drying)?: A Little Help from another person to put on and taking off regular upper body clothing?: A Little Help from another person to put on and taking off regular lower body clothing?: A Little 6 Click Score: 19   End of Session Nurse Communication: Mobility status  Activity Tolerance: Patient tolerated treatment well Patient left: in chair  OT Visit Diagnosis: Unsteadiness on feet (R26.81);Other abnormalities of gait and mobility (R26.89);History of falling (Z91.81);Muscle weakness (generalized) (M62.81)                Time: 7903-8333 OT Time Calculation (min): 25 min Charges:  OT General Charges $OT Visit: 1 Visit OT Evaluation $OT Eval Moderate Complexity: 1 Mod OT Treatments $Self Care/Home Management : 8-22 mins  Kari Baars, OT Acute Rehabilitation Services [phone] Office- [phone], Edwena Felty D 03/02/2018, 2:04 PM

## 2018-03-02 NOTE — Progress Notes (Signed)
Patient has been very uncomfortable and restless all shift. "Can't find a way to get comfortable". Offered PRN meds when allowed, but she has refused several times. Only medicated twice this shift. Heat not effective. Eulas Post, RN

## 2018-03-02 NOTE — Progress Notes (Signed)
PROGRESS NOTE  Tina Bailey ZTI:[id number] DOB: [birth year] DOA: 02/28/2018 PCP: Midge Minium, MD  HPI/Recap of past 24 hours:   1.8 liter urine, na improving, remain oxygen dependent, O2 dropped to 79 on room air when she ambulate, less edematous  2 BM documented  she report at baseline not dependent on oxygen  Assessment/Plan: Active Problems:   Pancreatic mass   Hepatocellular carcinoma (HCC)   Chronic pain   Malnutrition of moderate degree   Severe protein-calorie malnutrition (HCC)   Hyponatremia   Fluid overload   Rash   COPD (chronic obstructive pulmonary disease) (HCC)   Tobacco abuse   Acute respiratory failure with hypoxia (HCC)   Acute respiratory failure with hypoxia (HCC) -cxr on presentation "Changes of mild CHF with vascular congestion and small effusions." -she has Bilateral lower extremity edema (patient came to the hospital due to progressive bilateral lower extremity edema for the last 3 weeks difficulty ambulating), venous Doppler no DVT -Echocardiogram pending -Troponin mild and flat at 0.06, BNP elevated at 372 -Continue IV Lasix 40 mg twice a day as long as blood pressure and renal function allows -Strict intake and output, daily weight  Hyponatremia -Sodium 129 on presentation -Likely from volume overload -Sodium improving, continue Lasix   COPD/smoker Reports not on home o2 Currently no wheezing Smoking cessation education provided  H/o HCC s/p resection with negative margin, under survillence  Severe malnutrition in context of chronic illness, Underweight -Nutrition input appreciated  Advanced osteoporosis/multiple chronic vertebral compression fracture/h/o of right femur fracture status post ORIF/multiple rib fractures/h/o sternum fracture/ chronic pain -She is recently started on Evenity monthly injection ,so far she received the second dose -He is also on vitamin D and calcium supplement -She is followed with pain  clinic  Chronic constipation -Patient declined Relistor today, agreed with MiraLAX and Senokot for now  Depression/anxiety -tsh nwl -She has been on Wellbutrin which helped depression -Daughter who patient lives with for the last 10 years request some anger anxiety medication -will try low-dose BuSpar -Advised follow-up with psychiatry  FTT Family is working on getting her to ALF    Code Status: DNR  Family Communication: patient and daughter over the phone with permission on 12/12  Disposition Plan: home with home health in 1-2 days   Consultants:  none  Procedures:  none  Antibiotics:  none   Objective: BP 140/74 (BP Location: Left Arm)   Pulse 99   Temp 97.8 F (36.6 C) (Oral)   Resp 20   Ht 5\' 3"  (1.6 m)   Wt 41.8 kg   SpO2 100%   BMI 16.32 kg/m   Intake/Output Summary (Last 24 hours) at 03/02/2018 0743 Last data filed at 03/02/2018 0046 Gross per 24 hour  Intake 240 ml  Output 1800 ml  Net -1560 ml   Filed Weights   02/28/18 1440 03/01/18 0545 03/02/18 0531  Weight: 45.4 kg 44.7 kg 41.8 kg    Exam: Patient is examined daily including today on 03/02/2018, exams remain the same as of yesterday except that has changed    General:  Very fail, chronically ill, malnourished  Cardiovascular: RRR  Respiratory: CTABL  Abdomen: Soft/ND/NT, positive BS  Musculoskeletal: bilateral lower extremity pitting Edema up to knee  Neuro: alert, oriented   Data Reviewed: Basic Metabolic Panel: Recent Labs  Lab 02/27/18 1545 02/28/18 1527 03/01/18 0601 03/02/18 0553  NA 130* 129* 131* 133*  K 5.5* 5.1 4.3 3.9  CL 91* 88* 86* 87*  CO2  36* 32 37* 38*  GLUCOSE 99 91 100* 99  BUN 23 17 13 12   CREATININE 0.53 0.38* 0.46 0.34*  CALCIUM 8.7 8.0* 8.0* 7.7*  MG  --   --  2.1 2.0  PHOS  --   --  3.7  --    Liver Function Tests: Recent Labs  Lab 02/27/18 1545 02/28/18 1529 03/01/18 0601  AST 27 28 27   ALT 20 25 24   ALKPHOS 138* 113 119    BILITOT 0.4 0.5 0.6  PROT 5.8* 5.6* 5.6*  ALBUMIN 3.5 3.2* 3.2*   Recent Labs  Lab 02/27/18 1545  LIPASE 6.0*   No results for input(s): AMMONIA in the last 168 hours. CBC: Recent Labs  Lab 02/28/18 1527 03/01/18 0601 03/02/18 0553  WBC 8.0 5.6 7.2  NEUTROABS  --   --  5.6  HGB 14.4 15.1* 14.4  HCT 46.5* 49.6* 47.2*  MCV 95.1 94.3 94.4  PLT 221 229 217   Cardiac Enzymes:   Recent Labs  Lab 02/28/18 2147 03/01/18 0244 03/01/18 0601  TROPONINI 0.06* 0.06* 0.06*   BNP (last 3 results) Recent Labs    02/28/18 1529  BNP 772.7*    ProBNP (last 3 results) Recent Labs    02/27/18 1545  PROBNP 749.0*    CBG: No results for input(s): GLUCAP in the last 168 hours.  Recent Results (from the past 240 hour(s))  Urine Culture     Status: None   Collection Time: 02/28/18  8:54 PM  Result Value Ref Range Status   Specimen Description   Final    URINE, CLEAN CATCH Performed at [facility], [place] [address] [place], [place] [address]    Special Requests   Final    NONE Performed at [facility], [place] [address], [place], [place] [address]    Culture   Final    NO GROWTH Performed at [facility], [place] [address], [place], [place] [address]    Report Status 03/02/2018 FINAL  Final     Studies: Vas [place] Lower Extremity Venous (dvt)  Result Date: 03/01/2018  Lower Venous Study Indications: Edema, and Pain.  Limitations: Body habitus and severe edema,pain and sensitive for compression. Comparison Study: 12/14/2017 venous duplex exam Performing Technologist: Rudell Cobb  Examination Guidelines: A complete evaluation includes B-mode imaging, spectral Doppler, color Doppler, and power Doppler as needed of all accessible portions of each vessel. Bilateral testing is considered an integral part of a complete examination. Limited examinations for reoccurring indications may be performed as noted.  Right Venous Findings:  +---+---------------+---------+-----------+----------+-------+    CompressibilityPhasicitySpontaneityPropertiesSummary +---+---------------+---------+-----------+----------+-------+ CFVFull           Yes      Yes                          +---+---------------+---------+-----------+----------+-------+  Left Venous Findings: +---------+---------------+---------+-----------+----------+-------+          CompressibilityPhasicitySpontaneityPropertiesSummary +---------+---------------+---------+-----------+----------+-------+ CFV      Full           Yes      Yes                          +---------+---------------+---------+-----------+----------+-------+ SFJ      Full                                                 +---------+---------------+---------+-----------+----------+-------+  FV Prox  Full                                                 +---------+---------------+---------+-----------+----------+-------+ FV Mid   Full                                                 +---------+---------------+---------+-----------+----------+-------+ FV DistalFull                                                 +---------+---------------+---------+-----------+----------+-------+ PFV      Full                                                 +---------+---------------+---------+-----------+----------+-------+ POP      Full           Yes      Yes                          +---------+---------------+---------+-----------+----------+-------+ PTV      Full                                                 +---------+---------------+---------+-----------+----------+-------+ PERO     Full                                                 +---------+---------------+---------+-----------+----------+-------+    Summary: Right: No evidence of common femoral vein obstruction. Left: There is no evidence of deep vein thrombosis in the lower extremity. However, portions of  this examination were limited- see technologist comments above. No cystic structure found in the popliteal fossa.  *See table(s) above for measurements and observations. Electronically signed by Monica Martinez MD on 03/01/2018 at 3:03:08 PM.    Final     Scheduled Meds: . buPROPion  150 mg Oral Daily  . busPIRone  5 mg Oral BID  . enoxaparin (LOVENOX) injection  30 mg Subcutaneous Q24H  . furosemide  40 mg Intravenous Q12H  . Methylnaltrexone Bromide  450 mg Oral Daily  . polyethylene glycol  17 g Oral Daily  . sodium chloride flush  3 mL Intravenous Q12H  . thiamine  100 mg Oral Daily    Continuous Infusions: . sodium chloride       Time spent: 20mins I have personally reviewed and interpreted on  03/02/2018 daily labs,  imagings as discussed above under date review session and assessment and plans.  I reviewed all nursing notes, pharmacy notes,  vitals, pertinent old records  I have discussed plan of care as described above with RN , patient on 03/02/2018   Florencia Reasons MD, PhD  [facility] Pager [phone]. If  7PM-7AM, please contact night-coverage at [email or web address], password [facility] 03/02/2018, 7:43 AM  LOS: 2 days

## 2018-03-02 NOTE — Plan of Care (Signed)
  Problem: Education: Goal: Knowledge of General Education information will improve Description Including pain rating scale, medication(s)/side effects and non-pharmacologic comfort measures Outcome: Progressing   Problem: Clinical Measurements: Goal: Ability to maintain clinical measurements within normal limits will improve Outcome: Progressing Goal: Will remain free from infection Outcome: Progressing Goal: Diagnostic test results will improve Outcome: Progressing Goal: Respiratory complications will improve Outcome: Progressing Goal: Cardiovascular complication will be avoided Outcome: Progressing   Problem: Activity: Goal: Risk for activity intolerance will decrease Outcome: Progressing   Problem: Nutrition: Goal: Adequate nutrition will be maintained Outcome: Progressing   Problem: Coping: Goal: Level of anxiety will decrease Outcome: Progressing   Problem: Elimination: Goal: Will not experience complications related to bowel motility Outcome: Progressing Goal: Will not experience complications related to urinary retention Outcome: Progressing   Problem: Pain Managment: Goal: General experience of comfort will improve Outcome: Progressing   Problem: Skin Integrity: Goal: Risk for impaired skin integrity will decrease Outcome: Progressing   Problem: Activity: Goal: Capacity to carry out activities will improve Outcome: Progressing   Problem: Cardiac: Goal: Ability to achieve and maintain adequate cardiopulmonary perfusion will improve Outcome: Progressing

## 2018-03-02 NOTE — Progress Notes (Signed)
  Echocardiogram 2D Echocardiogram has been performed.  Bobbye Charleston 03/02/2018, 2:56 PM

## 2018-03-02 NOTE — Care Management Important Message (Signed)
Important Message  Patient Details  Name: SHERE EISENHART MRN: [id number] Date of Birth: Mar 19, 1946   Medicare Important Message Given:  Yes    Kerin Salen 03/02/2018, 12:27 Ruth Message  Patient Details  Name: JAYDN FINCHER MRN: [id number] Date of Birth: September 29, 1945   Medicare Important Message Given:  Yes    Kerin Salen 03/02/2018, 12:27 PM

## 2018-03-03 LAB — BASIC METABOLIC PANEL
Anion gap: 9 (ref 5–15)
BUN: 13 mg/dL (ref 8–23)
CO2: 40 mmol/L — ABNORMAL HIGH (ref 22–32)
Calcium: 7.8 mg/dL — ABNORMAL LOW (ref 8.9–10.3)
Chloride: 86 mmol/L — ABNORMAL LOW (ref 98–111)
Creatinine, Ser: 0.43 mg/dL — ABNORMAL LOW (ref 0.44–1.00)
GFR calc Af Amer: >60 mL/min (ref >60)
GFR calc non Af Amer: >60 mL/min (ref >60)
Glucose, Bld: 96 mg/dL (ref 70–99)
Potassium: 3.6 mmol/L (ref 3.5–5.1)
Sodium: 135 mmol/L (ref 135–145)

## 2018-03-03 MED ORDER — BUPROPION HCL 75 MG PO TABS
75.0000 mg | ORAL_TABLET | Freq: Every day | ORAL | Status: DC
  Administered 2018-03-03 – 2018-03-05 (×2): 75 mg via ORAL
  Filled 2018-03-03 (×4): qty 1

## 2018-03-03 NOTE — Progress Notes (Signed)
PROGRESS NOTE  Tina Bailey CZY:[id number] DOB: 04-18-1945 DOA: 02/28/2018 PCP: Midge Minium, MD  HPI/Recap of past 24 hours:   1.7 liter urine, Na 135,   less edematous, denies sob at rest, still on oxygen supplement -5 L since hospitalization Reports feeling tired, poor appetite No fever Daughter at bedside   she report at baseline not dependent on oxygen  Assessment/Plan: Active Problems:   Pancreatic mass   Hepatocellular carcinoma (HCC)   Chronic pain   Malnutrition of moderate degree   Severe protein-calorie malnutrition (HCC)   Hyponatremia   Fluid overload   Rash   COPD (chronic obstructive pulmonary disease) (HCC)   Tobacco abuse   Acute respiratory failure with hypoxia (HCC)   Acute respiratory failure with hypoxia ([place]), acute on chronic diastolic CHF/pulmonary hypertension -cxr on presentation "Changes of mild CHF with vascular congestion and small effusions." -she has Bilateral lower extremity edema (patient came to the hospital due to progressive bilateral lower extremity edema for the last 3 weeks difficulty ambulating), venous Doppler no DVT -Echocardiogram "Normal LV EF, grade 1 diastolic dysfunction.Severe pulmonary hypertension, with mild RV dysfunction and  dilation" -Troponin mild and flat at 0.06, BNP elevated at 372 -Continue IV Lasix 40 mg twice a day as long as blood pressure and renal function allows -Strict intake and output, daily weight -Improving, edema significantly improved, ambulate and wean oxygen  Hyponatremia -Sodium 129 on presentation -Likely from volume overload -Sodium normalized today on 12/14, continue Lasix   COPD/smoker Reports not on home o2 Currently no wheezing Smoking cessation education provided  H/o [facility] s/p resection with negative margin, under survillence  Severe malnutrition in context of chronic illness, Underweight -Nutrition input appreciated  Advanced osteoporosis/multiple chronic  vertebral compression fracture/h/o of right femur fracture status post ORIF/multiple rib fractures/h/o sternum fracture/ chronic pain -She is recently started on Evenity monthly injection ,so far she received the second dose -He is also on vitamin D and calcium supplement -She is followed with pain clinic -Daughter wants to use as little narcotic as possible -Consider neurontin   Chronic constipation -Patient declined Relistor , agreed with MiraLAX and Senokot for now   Depression/anxiety -tsh nwl -She has been on Wellbutrin which helped depression, today daughter states patient wants to taper off wellbutrin, she thinks welbutri make her anxious, will taper -Daughter who patient lives with for the last 10 years request some anger anxiety medication -will try low-dose BuSpar -Advised follow-up with psychiatry   FTT Family is working on getting her to ALF PT recommended SNF, family agrees    Code Status: DNR  Family Communication: patient and daughter over the phone with permission on 12/12, daughter at bedside on December 14  Disposition Plan: SNF   Consultants:  none  Procedures:  none  Antibiotics:  none   Objective: BP 121/70 (BP Location: Left Arm)   Pulse 94   Temp 97.9 F (36.6 C) (Oral)   Resp 20   Ht 5\' 3"  (1.6 m)   Wt 41.1 kg   SpO2 92%   BMI 16.05 kg/m   Intake/Output Summary (Last 24 hours) at 03/03/2018 0739 Last data filed at 03/03/2018 0600 Gross per 24 hour  Intake 123 ml  Output 1775 ml  Net -1652 ml   Filed Weights   03/01/18 0545 03/02/18 0531 03/03/18 0500  Weight: 44.7 kg 41.8 kg 41.1 kg    Exam: Patient is examined daily including today on 03/03/2018, exams remain the same as of yesterday except that  has changed    General:  Very fail, chronically ill, malnourished  Cardiovascular: RRR  Respiratory: CTABL  Abdomen: Soft/ND/NT, positive BS  Musculoskeletal: bilateral lower extremity pitting Edema has much  improved  Neuro: alert, oriented   Data Reviewed: Basic Metabolic Panel: Recent Labs  Lab 02/27/18 1545 02/28/18 1527 03/01/18 0601 03/02/18 0553 03/03/18 0546  NA 130* 129* 131* 133* 135  K 5.5* 5.1 4.3 3.9 3.6  CL 91* 88* 86* 87* 86*  CO2 36* 32 37* 38* 40*  GLUCOSE 99 91 100* 99 96  BUN 23 17 13 12 13   CREATININE 0.53 0.38* 0.46 0.34* 0.43*  CALCIUM 8.7 8.0* 8.0* 7.7* 7.8*  MG  --   --  2.1 2.0  --   PHOS  --   --  3.7  --   --    Liver Function Tests: Recent Labs  Lab 02/27/18 1545 02/28/18 1529 03/01/18 0601  AST 27 28 27   ALT 20 25 24   ALKPHOS 138* 113 119  BILITOT 0.4 0.5 0.6  PROT 5.8* 5.6* 5.6*  ALBUMIN 3.5 3.2* 3.2*   Recent Labs  Lab 02/27/18 1545  LIPASE 6.0*   No results for input(s): AMMONIA in the last 168 hours. CBC: Recent Labs  Lab 02/28/18 1527 03/01/18 0601 03/02/18 0553  WBC 8.0 5.6 7.2  NEUTROABS  --   --  5.6  HGB 14.4 15.1* 14.4  HCT 46.5* 49.6* 47.2*  MCV 95.1 94.3 94.4  PLT 221 229 217   Cardiac Enzymes:   Recent Labs  Lab 02/28/18 2147 03/01/18 0244 03/01/18 0601  TROPONINI 0.06* 0.06* 0.06*   BNP (last 3 results) Recent Labs    02/28/18 1529  BNP 772.7*    ProBNP (last 3 results) Recent Labs    02/27/18 1545  PROBNP 749.0*    CBG: No results for input(s): GLUCAP in the last 168 hours.  Recent Results (from the past 240 hour(s))  Urine Culture     Status: None   Collection Time: 02/28/18  8:54 PM  Result Value Ref Range Status   Specimen Description   Final    URINE, CLEAN CATCH Performed at [facility], [place] [address], [place], [place] [address]    Special Requests   Final    NONE Performed at [facility], [place] [address], [place], [place] [address]    Culture   Final    NO GROWTH Performed at [facility], [place] [address], [place], [place] [address]    Report Status 03/02/2018 FINAL  Final     Studies: No results found.  Scheduled Meds: .  buPROPion  150 mg Oral Daily  . busPIRone  5 mg Oral BID  . enoxaparin (LOVENOX) injection  30 mg Subcutaneous Q24H  . furosemide  40 mg Intravenous Q12H  . polyethylene glycol  17 g Oral Daily  . sodium chloride flush  3 mL Intravenous Q12H  . thiamine  100 mg Oral Daily    Continuous Infusions: . sodium chloride       Time spent: 19mins I have personally reviewed and interpreted on  03/03/2018 daily labs,  imagings as discussed above under date review session and assessment and plans.  I reviewed all nursing notes, pharmacy notes,  vitals, pertinent old records  I have discussed plan of care as described above with RN , patient and daughter on 03/03/2018   Florencia Reasons MD, PhD  [facility] Pager [phone]. If 7PM-7AM, please contact night-coverage  at [email or web address], password [facility] 03/03/2018, 7:39 AM  LOS: 3 days

## 2018-03-03 NOTE — Progress Notes (Signed)
SATURATION QUALIFICATIONS: (This note is used to comply with regulatory documentation for home oxygen)  Patient Saturations on Room Air at Rest = 95%  Patient Saturations on Room Air while Ambulating = 86%  Patient Saturations on 2 Liters of oxygen while Ambulating = 93%  Please briefly explain why patient needs home oxygen: Pt ambulated around 120 feet in hallway and O2 Sats decreased to 86% RA towards end of walk. Pt became SOB and asked to sit down. Pt recovered well.

## 2018-03-03 NOTE — Progress Notes (Signed)
O2 Sats 95% RA at rest. Pt became very anxious, started breathing very shallow and rolling back and forth in bed c/o back pain. O2 Sats decreased to 75% RA. 2L/[place] reapplied and O2 Sats quickly increased to 94% 2L/[place].

## 2018-03-04 ENCOUNTER — Inpatient Hospital Stay (HOSPITAL_COMMUNITY): Payer: Medicare Other

## 2018-03-04 LAB — BASIC METABOLIC PANEL
Anion gap: 11 (ref 5–15)
BUN: 15 mg/dL (ref 8–23)
CO2: 41 mmol/L — ABNORMAL HIGH (ref 22–32)
Calcium: 8.3 mg/dL — ABNORMAL LOW (ref 8.9–10.3)
Chloride: 83 mmol/L — ABNORMAL LOW (ref 98–111)
Creatinine, Ser: 0.53 mg/dL (ref 0.44–1.00)
GFR calc Af Amer: >60 mL/min (ref >60)
GFR calc non Af Amer: >60 mL/min (ref >60)
Glucose, Bld: 105 mg/dL — ABNORMAL HIGH (ref 70–99)
Potassium: 4 mmol/L (ref 3.5–5.1)
Sodium: 135 mmol/L (ref 135–145)

## 2018-03-04 LAB — MAGNESIUM: Magnesium: 2.2 mg/dL (ref 1.7–2.4)

## 2018-03-04 MED ORDER — FUROSEMIDE 40 MG PO TABS
40.0000 mg | ORAL_TABLET | Freq: Every day | ORAL | Status: DC
  Administered 2018-03-05 – 2018-03-06 (×2): 40 mg via ORAL
  Filled 2018-03-04 (×2): qty 1

## 2018-03-04 MED ORDER — SODIUM CHLORIDE (PF) 0.9 % IJ SOLN
INTRAMUSCULAR | Status: AC
  Filled 2018-03-04: qty 50

## 2018-03-04 MED ORDER — POLYETHYLENE GLYCOL 3350 17 G PO PACK
17.0000 g | PACK | Freq: Two times a day (BID) | ORAL | Status: DC
  Administered 2018-03-05: 17 g via ORAL
  Filled 2018-03-04 (×2): qty 1

## 2018-03-04 MED ORDER — IOPAMIDOL (ISOVUE-370) INJECTION 76%
INTRAVENOUS | Status: AC
  Administered 2018-03-04: 100 mL
  Filled 2018-03-04: qty 100

## 2018-03-04 MED ORDER — SENNOSIDES-DOCUSATE SODIUM 8.6-50 MG PO TABS
1.0000 | ORAL_TABLET | Freq: Two times a day (BID) | ORAL | Status: DC
  Administered 2018-03-04 – 2018-03-06 (×4): 1 via ORAL
  Filled 2018-03-04 (×4): qty 1

## 2018-03-04 NOTE — Progress Notes (Signed)
PROGRESS NOTE  Tina Bailey MHD:[id number] DOB: June 07, 1945 DOA: 02/28/2018 PCP: Tina Minium, MD  HPI/Recap of past 24 hours:   Edema has almost resolved, but still desats to the 70's at time and continue requiring o2 supplement   Reports feeling tired, poor appetite No fever  she report at baseline not dependent on oxygen  Assessment/Plan: Active Problems:   Pancreatic mass   Hepatocellular carcinoma (HCC)   Chronic pain   Malnutrition of moderate degree   Severe protein-calorie malnutrition (HCC)   Hyponatremia   Fluid overload   Rash   COPD (chronic obstructive pulmonary disease) (HCC)   Tobacco abuse   Acute respiratory failure with hypoxia (HCC)   Acute respiratory failure with hypoxia ([place]), acute on chronic diastolic CHF/pulmonary hypertension -cxr on presentation "Changes of mild CHF with vascular congestion and small effusions." -she has Bilateral lower extremity edema (patient came to the hospital due to progressive bilateral lower extremity edema for the last 3 weeks difficulty ambulating), venous Doppler no DVT -Echocardiogram "Normal LV EF, grade 1 diastolic dysfunction.Severe pulmonary hypertension, with mild RV dysfunction and  dilation" -Troponin mild and flat at 0.06, BNP elevated at 372 -Continue IV Lasix 40 mg twice a day as long as blood pressure and renal function allows -Strict intake and output, daily weight -edema has almost resolved, but remain hypoxia, will get CTA to rule out PE, d/c iv lasix, change to oral lasix  Hyponatremia -Sodium 129 on presentation -Likely from volume overload -Sodium normalized today on 12/14, d/c Lasix, change to oral intake   COPD/smoker Reports not on home o2 Currently no wheezing Smoking cessation education provided  H/o [facility] s/p resection with negative margin, under survillence  Severe malnutrition in context of chronic illness, Underweight -Nutrition input appreciated  Advanced  osteoporosis/multiple chronic vertebral compression fracture/h/o of right femur fracture status post ORIF/multiple rib fractures/h/o sternum fracture/ chronic pain -She is recently started on Evenity monthly injection ,so far she received the second dose -He is also on vitamin D and calcium supplement -She is followed with pain clinic -Daughter wants to use as little narcotic as possible -Consider neurontin   Chronic constipation -Patient declined Relistor , agreed with MiraLAX and Senokot for now -had bm  Depression/anxiety -tsh nwl -She has been on Wellbutrin which helped depression, today daughter states patient wants to taper off wellbutrin, she thinks welbutri make her anxious, will taper -Daughter who patient lives with for the last 10 years request some anger anxiety medication -started on  low-dose BuSpar -Advised follow-up with psychiatry   FTT Family is working on getting her to ALF PT recommended SNF, family agrees    Code Status: DNR  Family Communication: patient and daughter over the phone with permission on 12/12, daughter at bedside on December 14  Disposition Plan: SNF with palliative care   Consultants:  none  Procedures:  none  Antibiotics:  none   Objective: BP 132/74 (BP Location: Right Arm)   Pulse 99   Temp 97.8 F (36.6 C) (Oral)   Resp 16   Ht 5\' 3"  (1.6 m)   Wt 41.1 kg   SpO2 98%   BMI 16.05 kg/m   Intake/Output Summary (Last 24 hours) at 03/04/2018 1328 Last data filed at 03/04/2018 1124 Gross per 24 hour  Intake 560 ml  Output 700 ml  Net -140 ml   Filed Weights   03/01/18 0545 03/02/18 0531 03/03/18 0500  Weight: 44.7 kg 41.8 kg 41.1 kg    Exam: Patient  is examined daily including today on 03/04/2018, exams remain the same as of yesterday except that has changed    General:  Very fail, chronically ill, very malnourished  Cardiovascular: RRR  Respiratory: CTABL  Abdomen: Soft/ND/NT, positive  BS  Musculoskeletal: bilateral lower extremity pitting Edema has almost resolved., prior surgical scan  Neuro: alert, oriented   Data Reviewed: Basic Metabolic Panel: Recent Labs  Lab 02/28/18 1527 03/01/18 0601 03/02/18 0553 03/03/18 0546 03/04/18 0657  NA 129* 131* 133* 135 135  K 5.1 4.3 3.9 3.6 4.0  CL 88* 86* 87* 86* 83*  CO2 32 37* 38* 40* 41*  GLUCOSE 91 100* 99 96 105*  BUN 17 13 12 13 15   CREATININE 0.38* 0.46 0.34* 0.43* 0.53  CALCIUM 8.0* 8.0* 7.7* 7.8* 8.3*  MG  --  2.1 2.0  --  2.2  PHOS  --  3.7  --   --   --    Liver Function Tests: Recent Labs  Lab 02/27/18 1545 02/28/18 1529 03/01/18 0601  AST 27 28 27   ALT 20 25 24   ALKPHOS 138* 113 119  BILITOT 0.4 0.5 0.6  PROT 5.8* 5.6* 5.6*  ALBUMIN 3.5 3.2* 3.2*   Recent Labs  Lab 02/27/18 1545  LIPASE 6.0*   No results for input(s): AMMONIA in the last 168 hours. CBC: Recent Labs  Lab 02/28/18 1527 03/01/18 0601 03/02/18 0553  WBC 8.0 5.6 7.2  NEUTROABS  --   --  5.6  HGB 14.4 15.1* 14.4  HCT 46.5* 49.6* 47.2*  MCV 95.1 94.3 94.4  PLT 221 229 217   Cardiac Enzymes:   Recent Labs  Lab 02/28/18 2147 03/01/18 0244 03/01/18 0601  TROPONINI 0.06* 0.06* 0.06*   BNP (last 3 results) Recent Labs    02/28/18 1529  BNP 772.7*    ProBNP (last 3 results) Recent Labs    02/27/18 1545  PROBNP 749.0*    CBG: No results for input(s): GLUCAP in the last 168 hours.  Recent Results (from the past 240 hour(s))  Urine Culture     Status: None   Collection Time: 02/28/18  8:54 PM  Result Value Ref Range Status   Specimen Description   Final    URINE, CLEAN CATCH Performed at [facility], [place] [address] [place], [place] [address]    Special Requests   Final    NONE Performed at [facility], [place] [address] [place], [place] [address]    Culture   Final    NO GROWTH Performed at [facility], [place] [address], [place], [place] [address]     Report Status 03/02/2018 FINAL  Final     Studies: No results found.  Scheduled Meds: . buPROPion  75 mg Oral QHS  . busPIRone  5 mg Oral BID  . enoxaparin (LOVENOX) injection  30 mg Subcutaneous Q24H  . furosemide  40 mg Intravenous Q12H  . polyethylene glycol  17 g Oral Daily  . sodium chloride flush  3 mL Intravenous Q12H  . thiamine  100 mg Oral Daily    Continuous Infusions: . sodium chloride       Time spent: 104mins I have personally reviewed and interpreted on  03/04/2018 daily labs,  imagings as discussed above under date review session and assessment and plans.  I reviewed all nursing notes, pharmacy notes,  vitals, pertinent old records  I have discussed plan of care as described above with RN , patient  on  03/04/2018   Florencia Reasons MD, PhD  [facility] Pager [phone]. If 7PM-7AM, please contact night-coverage at [email or web address], password [facility] 03/04/2018, 1:28 PM  LOS: 4 days

## 2018-03-05 ENCOUNTER — Ambulatory Visit: Payer: Medicare Other | Admitting: Physical Therapy

## 2018-03-05 DIAGNOSIS — I5033 Acute on chronic diastolic (congestive) heart failure: Secondary | ICD-10-CM

## 2018-03-05 DIAGNOSIS — I5032 Chronic diastolic (congestive) heart failure: Secondary | ICD-10-CM

## 2018-03-05 LAB — BASIC METABOLIC PANEL
Anion gap: 8 (ref 5–15)
BUN: 25 mg/dL — ABNORMAL HIGH (ref 8–23)
CO2: 42 mmol/L — ABNORMAL HIGH (ref 22–32)
Calcium: 8.4 mg/dL — ABNORMAL LOW (ref 8.9–10.3)
Chloride: 86 mmol/L — ABNORMAL LOW (ref 98–111)
Creatinine, Ser: 0.38 mg/dL — ABNORMAL LOW (ref 0.44–1.00)
GFR calc Af Amer: >60 mL/min (ref >60)
GFR calc non Af Amer: >60 mL/min (ref >60)
Glucose, Bld: 100 mg/dL — ABNORMAL HIGH (ref 70–99)
Potassium: 4.4 mmol/L (ref 3.5–5.1)
Sodium: 136 mmol/L (ref 135–145)

## 2018-03-05 LAB — BRAIN NATRIURETIC PEPTIDE: B Natriuretic Peptide: 442.9 pg/mL — ABNORMAL HIGH (ref 0.0–100.0)

## 2018-03-05 NOTE — Progress Notes (Signed)
Physical Therapy Treatment Patient Details Name: Tina Bailey MRN: [id number] DOB: [birth year] Today's Date: 03/05/2018    History of Present Illness 72 yo female admitted with leg edema, acute respiratory failure, CHF exac. hx of R femur fx-s/p IM nail 11/2016, multiple compression fxs, HCC, COPD, macular degeneration, severe osteoporosis    PT Comments    Pt is progressing slowly with mobility. She remains weak and fatigues easily. She is at high risk for falls when mobilizing. She continues to require O2 for activity. Cues required for safety throughout session. Daughter present towards end of session. Pt will not have 24/7care available at home. She is not able to safely manage at home alone at this time. D/C plan has been updated to SNF for ST rehab prior to pt returning home. Will continue to follow and progress activity as tolerated.     Follow Up Recommendations  SNF     Equipment Recommendations  None recommended by PT    Recommendations for Other Services       Precautions / Restrictions Precautions Precautions: Fall Precaution Comments: requiring O2 currently Restrictions Weight Bearing Restrictions: No    Mobility  Bed Mobility   Bed Mobility: Sit to Supine       Sit to supine: Min guard   General bed mobility comments: for safety.   Transfers Overall transfer level: Needs assistance Equipment used: 4-wheeled walker Transfers: Sit to/from Stand Sit to Stand: Min assist         General transfer comment: Assist to rise, stabilize, control descent. Increased time. High fall risk. Cues for safety.   Ambulation/Gait Ambulation/Gait assistance: Min Web designer (Feet): 30 Feet Assistive device: 4-wheeled walker Gait Pattern/deviations: Staggering right;Staggering left;Drifts right/left;Step-through pattern;Decreased stride length     General Gait Details: Very unsteady and at high risk for falls even with rollator use. Cues for safety,  proper use of walker, avoiding obstacles in hallway. Pt fatigues very easily. Remained on [place] O2-sats 95% on 3L.    Stairs             Wheelchair Mobility    Modified Rankin (Stroke Patients Only)       Balance Overall balance assessment: Needs assistance;History of Falls           Standing balance-Leahy Scale: Poor                              Cognition Arousal/Alertness: Awake/alert(but also somewhat lethargic) Behavior During Therapy: WFL for tasks assessed/performed Overall Cognitive Status: Within Functional Limits for tasks assessed                                        Exercises      General Comments        Pertinent Vitals/Pain Pain Assessment: Faces Faces Pain Scale: Hurts little more Pain Location: back Pain Descriptors / Indicators: Sore Pain Intervention(s): Limited activity within patient's tolerance;Repositioned    Home Living                      Prior Function            PT Goals (current goals can now be found in the care plan section) Progress towards PT goals: Progressing toward goals    Frequency    Min 3X/week      PT Plan  Discharge plan needs to be updated    Co-evaluation              AM-PAC PT "6 Clicks" Mobility   Outcome Measure  Help needed turning from your back to your side while in a flat bed without using bedrails?: None Help needed moving from lying on your back to sitting on the side of a flat bed without using bedrails?: None Help needed moving to and from a bed to a chair (including a wheelchair)?: A Little Help needed standing up from a chair using your arms (e.g., wheelchair or bedside chair)?: A Little Help needed to walk in hospital room?: A Little Help needed climbing 3-5 steps with a railing? : A Little 6 Click Score: 20    End of Session Equipment Utilized During Treatment: Gait belt;Oxygen Activity Tolerance: Patient limited by fatigue Patient left:  in bed;with call bell/phone within reach;with bed alarm set;with family/visitor present   PT Visit Diagnosis: Muscle weakness (generalized) (M62.81);Difficulty in walking, not elsewhere classified (R26.2);Unsteadiness on feet (R26.81);History of falling (Z91.81);Pain     Time: 8280-0349 PT Time Calculation (min) (ACUTE ONLY): 11 min  Charges:  $Gait Training: 8-22 mins                        Weston Anna, PT Acute Rehabilitation Services Pager: [phone] Office: [phone]

## 2018-03-05 NOTE — Progress Notes (Signed)
PROGRESS NOTE  Tina Bailey RXV:[id number] DOB: [birth year] DOA: 02/28/2018 PCP: Midge Minium, MD  HPI/Recap of past 24 hours:   Edema has almost resolved,  Remain on oxygen supplement, cta negative for PE   Reports feeling tired, poor appetite No fever  she report at baseline not dependent on oxygen  Daughter at bedisde Daughter reports patient sounds upset and confused this am over the phone, currently patient is calm and oriented.   Assessment/Plan: Active Problems:   Pancreatic mass   Hepatocellular carcinoma (HCC)   Chronic pain   Malnutrition of moderate degree   Severe protein-calorie malnutrition (HCC)   Hyponatremia   Fluid overload   Rash   COPD (chronic obstructive pulmonary disease) (HCC)   Tobacco abuse   Acute respiratory failure with hypoxia (HCC)   Acute respiratory failure with hypoxia (HCC), acute on chronic diastolic CHF/pulmonary hypertension -cxr on presentation "Changes of mild CHF with vascular congestion and small effusions." -she has Bilateral lower extremity edema (patient came to the hospital due to progressive bilateral lower extremity edema for the last 3 weeks and difficulty ambulating), venous Doppler no DVT -Echocardiogram "Normal LV EF, grade 1 diastolic dysfunction.Severe pulmonary hypertension, with mild RV dysfunction and  dilation" -Troponin mild and flat at 0.06, BNP elevated at 372 -she received IV Lasix 40 mg twice a day, edema has almost resolved. Change to oral lasix -Strict intake and output, daily weight - remain hypoxia,  CTA negative for  PE, may need to discharge on oxygen  Hyponatremia -Sodium 129 on presentation -Likely from volume overload -Sodium normalized today on 12/14, d/c Lasix, change to oral intake   COPD/smoker Reports not on home o2 Currently no wheezing Smoking cessation education provided  H/o [facility] s/p resection with negative margin, under survillence  Severe malnutrition in context of  chronic illness, Underweight -Nutrition input appreciated  Advanced osteoporosis/multiple chronic vertebral compression fracture/h/o of right femur fracture status post ORIF/multiple rib fractures/h/o sternum fracture/ chronic pain -She is recently started on Evenity monthly injection ,so far she received the second dose -He is also on vitamin D and calcium supplement -She is followed with pain clinic -Daughter wants to use as little narcotic as possible -Consider neurontin   Chronic constipation -Patient declined Relistor , agreed with MiraLAX and Senokot for now -had bm  Depression/anxiety -tsh nwl -She has been on Wellbutrin which helped depression, today daughter states patient wants to taper off wellbutrin, she thinks welbutri make her anxious, will taper -Daughter who patient lives with for the last 10 years request some anger anxiety medication -started on  low-dose BuSpar -Advised follow-up with psychiatry   FTT Family is working on getting her to ALF PT recommended SNF, family agrees    Code Status: DNR  Family Communication: patient and daughter over the phone with permission on 12/12, daughter at bedside on December 14 and 12/16  Disposition Plan: SNF with palliative care   Consultants:  none  Procedures:  none  Antibiotics:  none   Objective: BP 140/80 (BP Location: Left Arm)   Pulse 95   Temp 98.4 F (36.9 C) (Oral)   Resp 20   Ht 5\' 3"  (1.6 m)   Wt 41.1 kg   SpO2 92%   BMI 16.05 kg/m   Intake/Output Summary (Last 24 hours) at 03/05/2018 1624 Last data filed at 03/05/2018 1306 Gross per 24 hour  Intake 120 ml  Output 1075 ml  Net -955 ml   Filed Weights   03/01/18 0545 03/02/18  6144 03/03/18 0500  Weight: 44.7 kg 41.8 kg 41.1 kg    Exam: Patient is examined daily including today on 03/05/2018, exams remain the same as of yesterday except that has changed    General:  Very fail, chronically ill, very  malnourished  Cardiovascular: RRR  Respiratory: CTABL  Abdomen: Soft/ND/NT, positive BS, prior surgical scar right upper quadrant   Musculoskeletal: bilateral lower extremity pitting Edema has almost resolved  Neuro: alert, oriented   Data Reviewed: Basic Metabolic Panel: Recent Labs  Lab 03/01/18 0601 03/02/18 0553 03/03/18 0546 03/04/18 0657 03/05/18 0522  NA 131* 133* 135 135 136  K 4.3 3.9 3.6 4.0 4.4  CL 86* 87* 86* 83* 86*  CO2 37* 38* 40* 41* 42*  GLUCOSE 100* 99 96 105* 100*  BUN 13 12 13 15  25*  CREATININE 0.46 0.34* 0.43* 0.53 0.38*  CALCIUM 8.0* 7.7* 7.8* 8.3* 8.4*  MG 2.1 2.0  --  2.2  --   PHOS 3.7  --   --   --   --    Liver Function Tests: Recent Labs  Lab 02/27/18 1545 02/28/18 1529 03/01/18 0601  AST 27 28 27   ALT 20 25 24   ALKPHOS 138* 113 119  BILITOT 0.4 0.5 0.6  PROT 5.8* 5.6* 5.6*  ALBUMIN 3.5 3.2* 3.2*   Recent Labs  Lab 02/27/18 1545  LIPASE 6.0*   No results for input(s): AMMONIA in the last 168 hours. CBC: Recent Labs  Lab 02/28/18 1527 03/01/18 0601 03/02/18 0553  WBC 8.0 5.6 7.2  NEUTROABS  --   --  5.6  HGB 14.4 15.1* 14.4  HCT 46.5* 49.6* 47.2*  MCV 95.1 94.3 94.4  PLT 221 229 217   Cardiac Enzymes:   Recent Labs  Lab 02/28/18 2147 03/01/18 0244 03/01/18 0601  TROPONINI 0.06* 0.06* 0.06*   BNP (last 3 results) Recent Labs    02/28/18 1529 03/05/18 0522  BNP 772.7* 442.9*    ProBNP (last 3 results) Recent Labs    02/27/18 1545  PROBNP 749.0*    CBG: No results for input(s): GLUCAP in the last 168 hours.  Recent Results (from the past 240 hour(s))  Urine Culture     Status: None   Collection Time: 02/28/18  8:54 PM  Result Value Ref Range Status   Specimen Description   Final    URINE, CLEAN CATCH Performed at [facility], [place] [address] [place], [place] [address]    Special Requests   Final    NONE Performed at [facility], [place] [address],  [place], [place] [address]    Culture   Final    NO GROWTH Performed at [facility], [place] [address], [place], [place] [address]    Report Status 03/02/2018 FINAL  Final     Studies: No results found.  Scheduled Meds: . buPROPion  75 mg Oral QHS  . busPIRone  5 mg Oral BID  . enoxaparin (LOVENOX) injection  30 mg Subcutaneous Q24H  . furosemide  40 mg Oral Daily  . polyethylene glycol  17 g Oral BID  . senna-docusate  1 tablet Oral BID  . sodium chloride flush  3 mL Intravenous Q12H  . thiamine  100 mg Oral Daily    Continuous Infusions: . sodium chloride       Time spent: 59mins I have personally reviewed and interpreted on  03/05/2018 daily labs,  imagings as discussed above under date review session and assessment  and plans.  I reviewed all nursing notes, pharmacy notes,  vitals, pertinent old records  I have discussed plan of care as described above with RN , patient and daughter on 03/05/2018   Florencia Reasons MD, PhD  [facility] Pager [phone]. If 7PM-7AM, please contact night-coverage at [email or web address], password [facility] 03/05/2018, 4:24 PM  LOS: 5 days

## 2018-03-05 NOTE — NC FL2 (Addendum)
[place] LEVEL OF CARE SCREENING TOOL     IDENTIFICATION  Patient Name: Tina Bailey Birthdate: 1945-06-03 Sex: female Admission Date (Current Location): 02/28/2018  [facility] and [place] Number:  Herbalist and Address:  [facility],  [place] [address], [place]      Provider Number: [id number]  Attending Physician Name and Address:  Florencia Reasons, MD  Relative Name and Phone Number:       Current Level of Care: Hospital Recommended Level of Care: [place] Prior Approval Number:    Date Approved/Denied:   PASRR Number:    Discharge Plan: SNF    Current Diagnoses: Patient Active Problem List   Diagnosis Date Noted  . COPD with acute exacerbation ([place]) 02/28/2018  . Hyponatremia 02/28/2018  . Fluid overload 02/28/2018  . Rash 02/28/2018  . COPD (chronic obstructive pulmonary disease) ([place]) 02/28/2018  . Tobacco abuse 02/28/2018  . Acute respiratory failure with hypoxia ([place]) 02/28/2018  . Acute blood loss anemia 12/06/2017  . Thrombocytopenia ([place]) 12/06/2017  . Severe protein-calorie malnutrition ([place]) 12/05/2017  . Closed comminuted intertrochanteric fracture of proximal end of right femur ([place]) 12/04/2017  . Chronic thoracic back pain 03/31/2017  . Physical exam 01/09/2017  . Malnutrition of moderate degree 12/22/2016  . Anxiety about health 09/27/2016  . Osteoporosis 07/13/2016  . Chronic narcotic use 07/13/2016  . Constipation 04/26/2015  . Chronic pain 04/26/2015  . Depression 04/26/2015  . COPD GOLD IV / still somking 03/03/2015  . Cigarette smoker 03/03/2015  . Cough 03/03/2015  . Hepatocellular carcinoma ([place]) 12/30/2014  . Pancreatic mass 12/04/2014  . Blue toe syndrome ([place]) 11/27/2014    Orientation RESPIRATION BLADDER Height & Weight     Self, Time, Place  Normal Continent Weight: 90 lb 9.7 oz (41.1 kg) Height:  5\' 3"  (160 cm)  BEHAVIORAL SYMPTOMS/MOOD NEUROLOGICAL BOWEL NUTRITION  STATUS      Continent    AMBULATORY STATUS COMMUNICATION OF NEEDS Skin   Extensive Assist Verbally Normal                       Personal Care Assistance Level of Assistance  Bathing, Dressing, Feeding Bathing Assistance: Maximum assistance Feeding assistance: Independent Dressing Assistance: Limited assistance     Functional Limitations Info  Sight, Hearing, Speech Sight Info: Impaired Hearing Info: Adequate Speech Info: Adequate    SPECIAL CARE FACTORS FREQUENCY  PT (By licensed PT), OT (By licensed OT)     PT Frequency: 5x/week OT Frequency: 5x/week             Contractures Contractures Info: Not present    Additional Factors Info  Code Status, Allergies, Psychotropic Code Status Info: Fullcode  Allergies Info: Allergies: Sertraline Hcl, Amoxicillin-pot Clavulanate, Clindamycin/lincomycin, Paroxetine Hcl, Sulfa Antibiotics Psychotropic Info: Wellbutrin, Buspar         Current Medications (03/05/2018):  This is the current hospital active medication list Current Facility-Administered Medications  Medication Dose Route Frequency Provider Last Rate Last Dose  . 0.9 %  sodium chloride infusion  250 mL Intravenous PRN Doutova, Anastassia, MD      . acetaminophen (TYLENOL) tablet 650 mg  650 mg Oral Q6H PRN Toy Baker, MD   650 mg at 03/01/18 0539   Or  . acetaminophen (TYLENOL) suppository 650 mg  650 mg Rectal Q6H PRN Doutova, Anastassia, MD      . bisacodyl (DULCOLAX) suppository 10 mg  10 mg Rectal Daily PRN Doutova, Anastassia,  MD      . buPROPion [facility]) tablet 75 mg  75 mg Oral QHS Florencia Reasons, MD   75 mg at 03/03/18 2006  . busPIRone (BUSPAR) tablet 5 mg  5 mg Oral BID Florencia Reasons, MD   5 mg at 03/05/18 0825  . enoxaparin (LOVENOX) injection 30 mg  30 mg Subcutaneous Q24H Doutova, Anastassia, MD   30 mg at 03/03/18 2006  . furosemide (LASIX) tablet 40 mg  40 mg Oral Daily Florencia Reasons, MD   40 mg at 03/05/18 0824  . HYDROcodone-acetaminophen  (NORCO/VICODIN) 5-325 MG per tablet 1-2 tablet  1-2 tablet Oral Q4H PRN Toy Baker, MD   2 tablet at 03/05/18 0825  . ipratropium-albuterol (DUONEB) 0.5-2.5 (3) MG/3ML nebulizer solution 3 mL  3 mL Nebulization Q4H PRN Doutova, Anastassia, MD      . ondansetron (ZOFRAN) tablet 4 mg  4 mg Oral Q6H PRN Doutova, Anastassia, MD       Or  . ondansetron (ZOFRAN) injection 4 mg  4 mg Intravenous Q6H PRN Doutova, Anastassia, MD   4 mg at 03/04/18 1328  . polyethylene glycol (MIRALAX / GLYCOLAX) packet 17 g  17 g Oral BID Florencia Reasons, MD      . senna-docusate (Senokot-S) tablet 1 tablet  1 tablet Oral BID Florencia Reasons, MD   1 tablet at 03/05/18 [phone]  . sodium chloride flush (NS) 0.9 % injection 3 mL  3 mL Intravenous Q12H Doutova, Anastassia, MD   3 mL at 03/05/18 0825  . sodium chloride flush (NS) 0.9 % injection 3 mL  3 mL Intravenous PRN Doutova, Anastassia, MD      . thiamine (VITAMIN B-1) tablet 100 mg  100 mg Oral Daily Florencia Reasons, MD   100 mg at 03/05/18 1062     Discharge Medications: Please see discharge summary for a list of discharge medications.  Relevant Imaging Results:  Relevant Lab Results:   Additional Information ssn: [id number]  Palliative Services.   Lia Hopping, LCSW

## 2018-03-05 NOTE — Clinical Social Work Note (Signed)
Clinical Social Work Assessment  Patient Details  Name: Tina Bailey MRN: [id number] Date of Birth: August 23, 1945  Date of referral:  03/05/18               Reason for consult:  Facility Placement                Permission sought to share information with:  Facility Sport and exercise psychologist, Family Supports Permission granted to share information::  Yes, Verbal Permission Granted  Name::        Agency::  SNF  Relationship::  Daughter  Contact Information:     Housing/Transportation Living arrangements for the past 2 months:  Single Family Home Source of Information:  Patient Patient Interpreter Needed:  None Criminal Activity/Legal Involvement Pertinent to Current Situation/Hospitalization:  No - Comment as needed Significant Relationships:  Warehouse manager Lives with:  Adult Children Do you feel safe going back to the place where you live?  Yes Need for family participation in patient care:  Yes   Care giving concerns:    SNF placement for rehab/pallative care.    Social Worker assessment / plan:  CSW met with the patient and daughter to discuss rehab options. Patient daughter reports the patient live in the home with her and daughter in law. Patient daughter reports they have been working to get the patient into [facility] for LTC. She reports the patient needs assistance with her bathing and dressing. CSW explain SNF process and provided a list of SNF's in the area. CSW will follow up with patient daughter to determine choice.  Patient will need insurance authorization. Daughter reports understanding.   Plan: SNF   Employment status:  Retired Forensic scientist:  Medicare PT Recommendations:  [place] / Referral to community resources:  [place]  Patient/Family's Response to care:  Agreeable and Responding well to care.   Patient/Family's Understanding of and Emotional Response to Diagnosis, Current Treatment, and  Prognosis: Patient not engaged during conversation. Patient daughter learning about patient decline and change in behavior. Patient daughter concern about patient "giving on life." She is hopeful the patient will rehab and Eventually transition to ALF.    Emotional Assessment Appearance:  Appears stated age Attitude/Demeanor/Rapport:    Affect (typically observed):  Calm Orientation:  Oriented to Self, Oriented to Place, Oriented to  Time Alcohol / Substance use:  Not Applicable Psych involvement (Current and /or in the community):  No (Comment)  Discharge Needs  Concerns to be addressed:  Discharge Planning Concerns Readmission within the last 30 days:  No Current discharge risk:  Dependent with Mobility Barriers to Discharge:  Continued Medical Work up, [place], LCSW 03/05/2018, 12:55 PM

## 2018-03-06 ENCOUNTER — Non-Acute Institutional Stay (SKILLED_NURSING_FACILITY): Payer: Medicare Other | Admitting: Internal Medicine

## 2018-03-06 ENCOUNTER — Inpatient Hospital Stay
Admission: RE | Admit: 2018-03-06 | Discharge: 2018-03-09 | Disposition: A | Discharge status: Other Acute Inpatient Hospital | Payer: Medicare Other | Source: Ambulatory Visit | Attending: Internal Medicine | Admitting: Internal Medicine

## 2018-03-06 DIAGNOSIS — S22009A Unspecified fracture of unspecified thoracic vertebra, initial encounter for closed fracture: Secondary | ICD-10-CM | POA: Diagnosis not present

## 2018-03-06 DIAGNOSIS — E871 Hypo-osmolality and hyponatremia: Secondary | ICD-10-CM | POA: Diagnosis not present

## 2018-03-06 DIAGNOSIS — I5033 Acute on chronic diastolic (congestive) heart failure: Secondary | ICD-10-CM | POA: Diagnosis not present

## 2018-03-06 DIAGNOSIS — Z7401 Bed confinement status: Secondary | ICD-10-CM | POA: Diagnosis not present

## 2018-03-06 DIAGNOSIS — M6281 Muscle weakness (generalized): Secondary | ICD-10-CM | POA: Diagnosis not present

## 2018-03-06 DIAGNOSIS — R262 Difficulty in walking, not elsewhere classified: Secondary | ICD-10-CM | POA: Diagnosis not present

## 2018-03-06 DIAGNOSIS — G8929 Other chronic pain: Secondary | ICD-10-CM | POA: Diagnosis not present

## 2018-03-06 DIAGNOSIS — J438 Other emphysema: Secondary | ICD-10-CM

## 2018-03-06 DIAGNOSIS — W19XXXA Unspecified fall, initial encounter: Secondary | ICD-10-CM | POA: Diagnosis not present

## 2018-03-06 DIAGNOSIS — J9601 Acute respiratory failure with hypoxia: Secondary | ICD-10-CM

## 2018-03-06 DIAGNOSIS — S299XXA Unspecified injury of thorax, initial encounter: Secondary | ICD-10-CM | POA: Diagnosis not present

## 2018-03-06 DIAGNOSIS — M8448XS Pathological fracture, other site, sequela: Secondary | ICD-10-CM | POA: Diagnosis not present

## 2018-03-06 DIAGNOSIS — M8088XD Other osteoporosis with current pathological fracture, vertebra(e), subsequent encounter for fracture with routine healing: Secondary | ICD-10-CM | POA: Diagnosis not present

## 2018-03-06 DIAGNOSIS — J9691 Respiratory failure, unspecified with hypoxia: Secondary | ICD-10-CM | POA: Diagnosis not present

## 2018-03-06 DIAGNOSIS — M549 Dorsalgia, unspecified: Secondary | ICD-10-CM | POA: Diagnosis not present

## 2018-03-06 DIAGNOSIS — M546 Pain in thoracic spine: Secondary | ICD-10-CM | POA: Diagnosis not present

## 2018-03-06 DIAGNOSIS — G894 Chronic pain syndrome: Secondary | ICD-10-CM | POA: Diagnosis not present

## 2018-03-06 DIAGNOSIS — S79911A Unspecified injury of right hip, initial encounter: Secondary | ICD-10-CM | POA: Diagnosis not present

## 2018-03-06 DIAGNOSIS — R600 Localized edema: Secondary | ICD-10-CM | POA: Diagnosis not present

## 2018-03-06 DIAGNOSIS — M25551 Pain in right hip: Secondary | ICD-10-CM | POA: Diagnosis not present

## 2018-03-06 DIAGNOSIS — M25552 Pain in left hip: Secondary | ICD-10-CM | POA: Diagnosis not present

## 2018-03-06 DIAGNOSIS — J449 Chronic obstructive pulmonary disease, unspecified: Secondary | ICD-10-CM | POA: Diagnosis not present

## 2018-03-06 DIAGNOSIS — M8000XG Age-related osteoporosis with current pathological fracture, unspecified site, subsequent encounter for fracture with delayed healing: Secondary | ICD-10-CM

## 2018-03-06 DIAGNOSIS — M2550 Pain in unspecified joint: Secondary | ICD-10-CM | POA: Diagnosis not present

## 2018-03-06 DIAGNOSIS — C220 Liver cell carcinoma: Secondary | ICD-10-CM

## 2018-03-06 DIAGNOSIS — M4014 Other secondary kyphosis, thoracic region: Secondary | ICD-10-CM | POA: Diagnosis not present

## 2018-03-06 DIAGNOSIS — E43 Unspecified severe protein-calorie malnutrition: Secondary | ICD-10-CM | POA: Diagnosis not present

## 2018-03-06 DIAGNOSIS — Z720 Tobacco use: Secondary | ICD-10-CM | POA: Diagnosis not present

## 2018-03-06 DIAGNOSIS — F418 Other specified anxiety disorders: Secondary | ICD-10-CM | POA: Diagnosis not present

## 2018-03-06 DIAGNOSIS — I2510 Atherosclerotic heart disease of native coronary artery without angina pectoris: Secondary | ICD-10-CM | POA: Diagnosis not present

## 2018-03-06 DIAGNOSIS — E440 Moderate protein-calorie malnutrition: Secondary | ICD-10-CM | POA: Diagnosis not present

## 2018-03-06 DIAGNOSIS — I712 Thoracic aortic aneurysm, without rupture: Secondary | ICD-10-CM | POA: Diagnosis not present

## 2018-03-06 DIAGNOSIS — S0990XA Unspecified injury of head, initial encounter: Secondary | ICD-10-CM | POA: Diagnosis not present

## 2018-03-06 DIAGNOSIS — S79912A Unspecified injury of left hip, initial encounter: Secondary | ICD-10-CM | POA: Diagnosis not present

## 2018-03-06 LAB — BASIC METABOLIC PANEL
Anion gap: 3 — ABNORMAL LOW (ref 5–15)
BUN: 23 mg/dL (ref 8–23)
CO2: 48 mmol/L — ABNORMAL HIGH (ref 22–32)
Calcium: 8.3 mg/dL — ABNORMAL LOW (ref 8.9–10.3)
Chloride: 86 mmol/L — ABNORMAL LOW (ref 98–111)
Creatinine, Ser: 0.46 mg/dL (ref 0.44–1.00)
GFR calc Af Amer: >60 mL/min (ref >60)
GFR calc non Af Amer: >60 mL/min (ref >60)
Glucose, Bld: 103 mg/dL — ABNORMAL HIGH (ref 70–99)
Potassium: 4.2 mmol/L (ref 3.5–5.1)
Sodium: 137 mmol/L (ref 135–145)

## 2018-03-06 MED ORDER — IPRATROPIUM-ALBUTEROL 0.5-2.5 (3) MG/3ML IN SOLN
3.0000 mL | Freq: Once | RESPIRATORY_TRACT | Status: AC
  Administered 2018-03-06: 3 mL via RESPIRATORY_TRACT
  Filled 2018-03-06: qty 3

## 2018-03-06 MED ORDER — THIAMINE HCL 100 MG PO TABS: 100.0000 mg | ORAL_TABLET | Freq: Every day | ORAL | Status: DC

## 2018-03-06 MED ORDER — BUSPIRONE HCL 5 MG PO TABS: 5.0000 mg | ORAL_TABLET | Freq: Two times a day (BID) | ORAL | Status: DC

## 2018-03-06 MED ORDER — HYDROCODONE-ACETAMINOPHEN 5-325 MG PO TABS: 1.0000 | ORAL_TABLET | ORAL | 0 refills | Status: DC | PRN

## 2018-03-06 MED ORDER — LORAZEPAM 2 MG/ML IJ SOLN
0.5000 mg | Freq: Once | INTRAMUSCULAR | Status: AC
  Administered 2018-03-06: 0.5 mg via INTRAVENOUS
  Filled 2018-03-06: qty 1

## 2018-03-06 MED ORDER — SENNOSIDES-DOCUSATE SODIUM 8.6-50 MG PO TABS: 1.0000 | ORAL_TABLET | Freq: Two times a day (BID) | ORAL | Status: DC

## 2018-03-06 MED ORDER — FUROSEMIDE 40 MG PO TABS: 40.0000 mg | ORAL_TABLET | Freq: Every day | ORAL | Status: DC

## 2018-03-06 MED ORDER — ACETAMINOPHEN 325 MG PO TABS: 650.0000 mg | ORAL_TABLET | Freq: Four times a day (QID) | ORAL | Status: DC | PRN

## 2018-03-06 NOTE — Discharge Summary (Signed)
Discharge Summary  Tina Bailey ZDG:[id number] DOB: Nov 25, 1945  PCP: Midge Minium, MD  Admit date: 02/28/2018 Discharge date: 03/06/2018  Time spent: 29mins, more than 50% time spent on coordination of care.  Recommendations for Outpatient Follow-up:  1. F/u with SNF MD for hospital discharge follow up, repeat cbc/bmp at follow up 2. F/u with neuropsych  3. F/u palliative care at SNF for symptom management ( pain, anxiety, ect), transition to hospice if appropriate. Daughter agrees   Discharge Diagnoses:  Active Hospital Problems   Diagnosis Date Noted  . Acute on chronic diastolic CHF (congestive heart failure) ([place])   . Hyponatremia 02/28/2018  . Fluid overload 02/28/2018  . Rash 02/28/2018  . COPD (chronic obstructive pulmonary disease) ([place]) 02/28/2018  . Tobacco abuse 02/28/2018  . Acute respiratory failure with hypoxia ([place]) 02/28/2018  . Severe protein-calorie malnutrition ([place]) 12/05/2017  . Malnutrition of moderate degree 12/22/2016  . Chronic pain 04/26/2015  . Hepatocellular carcinoma ([place]) 12/30/2014  . Pancreatic mass 12/04/2014    Resolved Hospital Problems  No resolved problems to display.    Discharge Condition: stable  Diet recommendation: heart healthy  Filed Weights   03/01/18 0545 03/02/18 0531 03/03/18 0500  Weight: 44.7 kg 41.8 kg 41.1 kg    History of present illness: (per admitting MD Dr Roel Cluck) HPI: Tina Bailey is a 72 y.o. female with medical history significant of protein-calorie malnourishment and remote history of [place] in remission.  severe osteoporosis with multiple fractures    Presented with  Leg edema Reports leg swelling getting progressively worse over the past few days but have been swelling for few weeks  Now left worse than right She tried to use support hose but did not help Developed a rash all over her body excoriations that are pruritic. She has been picking at them No prior hx of eczema,  He has  been loosing weight but recently started to gain it back   She usually not on oxygen at baseline  No fever no chest pain  She recently broke her  sternum after  A fall reports her osteoporosis so bad if she sneezes   her ribs break    Regarding pertinent Chronic problems:     History of  osteoporosis status post fractures recent hip replacement  Denies any history of heart failure or CHF.   History of [place] status post resection currently in remission While in ER: Gave lasix 40 mg IV   Hospital Course:  Active Problems:   Pancreatic mass   Hepatocellular carcinoma (H[facility])   Chronic pain   Malnutrition of moderate degree   Severe protein-calorie malnutrition (HCC)   Hyponatremia   Fluid overload   Rash   COPD (chronic obstructive pulmonary disease) (HCC)   Tobacco abuse   Acute respiratory failure with hypoxia (HCC)   Acute on chronic diastolic CHF (congestive heart failure) (HCC)   Acute respiratory failure with hypoxia ([place]), acute on chronic diastolic CHF/pulmonary hypertension -cxr on presentation "Changes of mild CHF with vascular congestion and small effusions." -she has Bilateral lower extremity edema (patient came to the hospital due to progressive bilateral lower extremity edema for the last 3 weeks and difficulty ambulating), venous Doppler no DVT -Echocardiogram "Normal LV EF, grade 1 diastolic dysfunction.Severe pulmonary hypertension, with mild RV dysfunction and dilation" -Troponin mild and flat at 0.06, BNP elevated at 372 -she received IV Lasix 40 mg twice a day, edema has almost resolved. Change to oral lasix -Strict intake and output, daily weight -  remain hypoxia,  CTA negative for  PE, discharge on oxygen, snf to wean as tolerated.  Hyponatremia -Sodium 129 on presentation -Likely from volume overload -Sodium normalized today on 12/14, d/c Lasix, change to oral intake   COPD/smoker Reports not on home o2 Currently no wheezing Smoking  cessation education provided  H/o [facility] s/p resection with negative margin, under survillence  Severe malnutrition in context of chronic illness, Underweight -Nutrition input appreciated  Advanced osteoporosis/multiple chronic vertebral compression fracture/h/o of right femur fracture status post ORIF/multiple rib fractures/h/o sternum fracture/ chronic pain -She is recently started on Evenity monthly injection ,so far she received the second dose -He is also on vitamin D and calcium supplement -She is followed with pain clinic -Daughter wants to use as little narcotic as possible -Consider starting on neurontin, to be determined by pain clinic or palliative care at SNF  Chronic constipation -Patient declined Relistor , agreed with MiraLAX and Senokot for now -had bm  Depression/anxiety -tsh nwl -She has been on Wellbutrin which helped depression, today daughter states patient wants to taper off wellbutrin, she thinks welbutri make her anxious, wellbutrin tappered off.  -Daughter who patient lives with for the last 10 years request some anger anxiety medication -started on  low-dose BuSpar -Advised follow-up with neuropsychiatry Palliative care to follow patient at snf.   FTT Family is working on getting her to ALF PT recommended SNF, family agrees    Code Status: DNR  Family Communication: patient and daughter over the phone with permission on 12/12 on 12/17, daughter at bedside on December 14 and 12/16  Disposition Plan: SNF with palliative care   Consultants:  none  Procedures:  none  Antibiotics:  none  Discharge Exam: BP (!) 144/73 (BP Location: Left Arm)   Pulse 91   Temp (!) 97.4 F (36.3 C) (Oral)   Resp 16   Ht 5\' 3"  (1.6 m)   Wt 41.1 kg   SpO2 98%   BMI 16.05 kg/m   General: * Cardiovascular: * Respiratory: *  Discharge Instructions You were cared for by a hospitalist during your hospital stay. If you have any questions  about your discharge medications or the care you received while you were in the hospital after you are discharged, you can call the unit and asked to speak with the hospitalist on call if the hospitalist that took care of you is not available. Once you are discharged, your primary care physician will handle any further medical issues. Please note that NO REFILLS for any discharge medications will be authorized once you are discharged, as it is imperative that you return to your primary care physician (or establish a relationship with a primary care physician if you do not have one) for your aftercare needs so that they can reassess your need for medications and monitor your lab values.  Discharge Instructions    Diet - low sodium heart healthy   Complete by:  As directed    Increase activity slowly   Complete by:  As directed      Allergies as of 03/06/2018      Reactions   Sertraline Hcl    Zoloft, had a terrible reaction   Amoxicillin-pot Clavulanate Nausea Only   Has patient had a PCN reaction causing immediate rash, facial/tongue/throat swelling, SOB or lightheadedness with hypotension: No Has patient had a PCN reaction causing severe rash involving mucus membranes or skin necrosis: No Has patient had a PCN reaction that required hospitalization No Has patient had  a PCN reaction occurring within the last 10 years: No If all of the above answers are "NO", then may proceed with Cephalosporin use.   Clindamycin/lincomycin Other (See Comments)   Felt like it "burned out" her stomach/took a large dose   Paroxetine Hcl Rash   Sulfa Antibiotics Rash      Medication List    STOP taking these medications   buPROPion 150 MG 24 hr tablet Commonly known as:  WELLBUTRIN XL   HYDROmorphone 4 MG tablet Commonly known as:  DILAUDID     TAKE these medications   acetaminophen 325 MG tablet Commonly known as:  TYLENOL Take 2 tablets (650 mg total) by mouth every 6 (six) hours as needed for  mild pain (or Fever >/= 101).   bisacodyl 10 MG suppository Commonly known as:  DULCOLAX Place 1 suppository (10 mg total) rectally daily as needed for moderate constipation.   busPIRone 5 MG tablet Commonly known as:  BUSPAR Take 1 tablet (5 mg total) by mouth 2 (two) times daily.   CALCIUM 500+D HIGH POTENCY PO Take 1 tablet by mouth daily.   CENTRUM SILVER ULTRA WOMENS PO Take 1 tablet by mouth daily.   EVENITY 105 MG/1.17ML Sosy injection Generic drug:  Romosozumab-aqqg Inject 210 mg into the skin once.   feeding supplement (ENSURE ENLIVE) Liqd Take 237 mLs by mouth 2 (two) times daily between meals.   furosemide 40 MG tablet Commonly known as:  LASIX Take 1 tablet (40 mg total) by mouth daily.   Glycopyrrolate-Formoterol 9-4.8 MCG/ACT Aero Commonly known as:  BEVESPI AEROSPHERE Inhale 2 puffs into the lungs 2 (two) times daily. What changed:    when to take this  reasons to take this   HYDROcodone-acetaminophen 5-325 MG tablet Commonly known as:  NORCO/VICODIN Take 1-2 tablets by mouth every 4 (four) hours as needed for moderate pain.   ipratropium-albuterol 0.5-2.5 (3) MG/3ML Soln Commonly known as:  DUONEB Take 3 mLs by nebulization every 4 (four) hours as needed (SOB).   naproxen sodium 220 MG tablet Commonly known as:  ALEVE Take 440 mg by mouth 3 (three) times daily as needed (pain).   polyethylene glycol packet Commonly known as:  MIRALAX / GLYCOLAX Take 17 g by mouth daily.   promethazine 12.5 MG tablet Commonly known as:  PHENERGAN Take 12.5 mg by mouth every 6 (six) hours as needed for nausea or vomiting.   RELISTOR 150 MG Tabs Generic drug:  Methylnaltrexone Bromide Take 3 tablets by mouth daily.   senna-docusate 8.6-50 MG tablet Commonly known as:  Senokot-S Take 1 tablet by mouth 2 (two) times daily.   thiamine 100 MG tablet Take 1 tablet (100 mg total) by mouth daily.   vitamin B-12 1000 MCG tablet Commonly known as:   CYANOCOBALAMIN Take 1,000 mcg by mouth daily.   Vitamin D3 125 MCG (5000 UT) Caps Take 5,000 Units by mouth daily.      Allergies  Allergen Reactions  . Sertraline Hcl     Zoloft, had a terrible reaction  . Amoxicillin-Pot Clavulanate Nausea Only    Has patient had a PCN reaction causing immediate rash, facial/tongue/throat swelling, SOB or lightheadedness with hypotension: No Has patient had a PCN reaction causing severe rash involving mucus membranes or skin necrosis: No Has patient had a PCN reaction that required hospitalization No Has patient had a PCN reaction occurring within the last 10 years: No If all of the above answers are "NO", then may proceed with Cephalosporin use.   Marland Kitchen  Clindamycin/Lincomycin Other (See Comments)    Felt like it "burned out" her stomach/took a large dose  . Paroxetine Hcl Rash  . Sulfa Antibiotics Rash    Contact information for follow-up providers    Buford Dresser, MD Follow up in 1 month(s).   Specialty:  Cardiology Why:  diastolic chf/pulmonary hypertension Contact information: [address] [place] [address] [phone]        Midge Minium, MD Follow up.   Specialty:  Family Medicine Contact information: [address] [place] [address] [phone]        Abbie Sons, MD Follow up.   Specialty:  Psychiatry Why:  neuropsychology [address]  [phone]           Contact information for after-discharge care    [facility] .   Service:  Skilled Nursing Contact information: [address] [place] [phone]                   The results of significant diagnostics from this hospitalization (including imaging, microbiology, ancillary and laboratory) are listed below for reference.    Significant Diagnostic Studies: Dg Chest 2 View  Result Date: 02/28/2018 CLINICAL DATA:   COPD and hyponatremia EXAM: CHEST - 2 VIEW COMPARISON:  01/11/2018 FINDINGS: Cardiac shadow is stable. Aortic calcifications are seen as well as tortuosity stable from the previous exam. Small new effusions are seen bilaterally. Mild increased vascular congestion is noted when compare with the prior exam. Old sternal fractures are noted multiple compression fractures are seen in the thoracic spine stable from the previous exam. No new focal abnormality is seen. IMPRESSION: Changes of mild CHF with vascular congestion and small effusions. Electronically Signed   By: Inez Catalina M.D.   On: 02/28/2018 16:28   Ct Angio Chest Pe W Or Wo Contrast  Result Date: 03/04/2018 CLINICAL DATA:  Acute respiratory failure with hypoxia, chronic diastolic CHF, pulmonary hypertension, BILATERAL lower extremity edema, difficulty ambulating, question pulmonary embolism, interstitial lung disease EXAM: CT ANGIOGRAPHY CHEST WITH CONTRAST TECHNIQUE: Multidetector CT imaging of the chest was performed using the standard protocol during bolus administration of intravenous contrast. Multiplanar CT image reconstructions and MIPs were obtained to evaluate the vascular anatomy. CONTRAST:  179mL ISOVUE-370 IOPAMIDOL (ISOVUE-370) INJECTION 76% IV COMPARISON:  CT chest 12/25/2014 Correlation: Chest radiograph 02/29/2012 FINDINGS: Cardiovascular: Atherosclerotic calcifications aorta. Aneurysmal dilatation ascending thoracic aorta 4.2 cm diameter image 44. No evidence of aortic dissection. Pulmonary arteries well opacified and patent. No evidence of pulmonary embolism. No pericardial effusion. Mediastinum/Nodes: Esophagus unremarkable. No thoracic adenopathy. Base of cervical region normal appearance. Lungs/Pleura: Small RIGHT and tiny LEFT pleural effusions. Mild central peribronchial thickening. Lungs: Mildly hyperinflated. Minimal subsegmental atelectasis RIGHT upper lobe. Biapical scarring. No acute infiltrate, pleural effusion or  pneumothorax. Interstitial changes seen on prior chest radiograph improved. Upper Abdomen: Visualized upper abdomen unremarkable. Musculoskeletal: Diffuse osseous demineralization. Prior sternal fractures. Pronounced thoracic kyphosis with multiple thoracic spine compression fractures at T3-T8, superior endplates of W10-X32, L1, L3. RIGHT glenohumeral degenerative changes. Review of the MIP images confirms the above findings. IMPRESSION: No evidence of pulmonary embolism. BILATERAL pleural effusions small RIGHT and tiny LEFT. Minimal subsegmental atelectasis RIGHT upper lobe. Osseous demineralization with multiple thoracic spine compression fractures and secondary thoracic kyphosis. Prior sternal fractures. Aneurysmal dilatation ascending thoracic aorta 4.2 cm diameter, recommendation below. Recommend annual imaging followup by CTA or MRA. This recommendation  follows 2010 ACCF/AHA/AATS/ACR/ASA/SCA/SCAI/SIR/STS/SVM Guidelines for the Diagnosis and Management of Patients with Thoracic Aortic Disease. Circulation. 2010; 121: W098-J191 Aortic Atherosclerosis (ICD10-I70.0). Aortic aneurysm NOS (ICD10-I71.9). Electronically Signed   By: Lavonia Dana M.D.   On: 03/04/2018 15:51   Vas [place] Burn[place] B[place] With/wo Tbi  Result Date: 02/20/2018 LOWER EXTREMITY DOPPLER STUDY Indications: Peripheral artery disease.  Performing Technologist: Kathrine Comfort RVT, RDCS  Examination Guidelines: A complete evaluation includes at minimum, Doppler waveform signals and systolic blood pressure reading at the level of bilateral brachial, anterior tibial, and posterior tibial arteries, when vessel segments are accessible. Bilateral testing is considered an integral part of a complete examination. Photoelectric Plethysmograph (PPG) waveforms and toe systolic pressure readings are included as required and additional duplex testing as needed. Limited examinations for reoccurring indications may be performed as noted.  ABI Findings:  +---------+------------------+-----+----------+--------+ Right    Rt Pressure (mmHg)IndexWaveform  Comment  +---------+------------------+-----+----------+--------+ Brachial 135                                       +---------+------------------+-----+----------+--------+ PTA      92                0.68 monophasic         +---------+------------------+-----+----------+--------+ DP       92                0.68 monophasic         +---------+------------------+-----+----------+--------+ Great Toe74                0.55 Abnormal           +---------+------------------+-----+----------+--------+ +---------+------------------+-----+----------+-------+ Left     Lt Pressure (mmHg)IndexWaveform  Comment +---------+------------------+-----+----------+-------+ Brachial 135                                      +---------+------------------+-----+----------+-------+ PTA      113               0.84 monophasic        +---------+------------------+-----+----------+-------+ DP       112               0.83 monophasic        +---------+------------------+-----+----------+-------+ Great Toe79                0.59 Abnormal          +---------+------------------+-----+----------+-------+ +-------+-----------+-----------+------------+------------+ ABI/TBIToday's ABIToday's TBIPrevious ABIPrevious TBI +-------+-----------+-----------+------------+------------+ Right  0.68       0.55       0.66        0.28         +-------+-----------+-----------+------------+------------+ Left   0.84       0.59       0.78        0.62         +-------+-----------+-----------+------------+------------+ Right ABIs appear essentially unchanged. Left ABIs appear increased.  Summary: Right: Resting right ankle-brachial index indicates moderate right lower extremity arterial disease. The right toe-brachial index is abnormal. Left: Resting left ankle-brachial index indicates mild left  lower extremity arterial disease. The left toe-brachial index is abnormal.  *See table(s) above for measurements and observations.  Electronically signed by Curt Jews MD on 02/20/2018 at 5:08:04 PM.   Final    Vas [place] Lower Extremity Arterial Duplex  Result Date: 02/20/2018 LOWER EXTREMITY  ARTERIAL DUPLEX STUDY Indications: Peripheral artery disease.  Current ABI: RT= 0.68 LT= 0.84 Performing Technologist: Kathrine Comfort RVT, RDCS  Examination Guidelines: A complete evaluation includes B-mode imaging, spectral Doppler, color Doppler, and power Doppler as needed of all accessible portions of each vessel. Bilateral testing is considered an integral part of a complete examination. Limited examinations for reoccurring indications may be performed as noted.  Right Duplex Findings: +-----------+--------+-----+--------+----------+--------+            PSV cm/sRatioStenosisWaveform  Comments +-----------+--------+-----+--------+----------+--------+ CFA Distal 70                   biphasic           +-----------+--------+-----+--------+----------+--------+ DFA        55                   biphasic           +-----------+--------+-----+--------+----------+--------+ SFA Prox   0            occluded                   +-----------+--------+-----+--------+----------+--------+ SFA Mid    0            occluded                   +-----------+--------+-----+--------+----------+--------+ SFA Distal 0            occluded                   +-----------+--------+-----+--------+----------+--------+ POP Prox   40                   monophasic         +-----------+--------+-----+--------+----------+--------+ POP Distal 42                   monophasic         +-----------+--------+-----+--------+----------+--------+ ATA Distal 29                                      +-----------+--------+-----+--------+----------+--------+ PTA Distal 16                   monophasic          +-----------+--------+-----+--------+----------+--------+ PERO Distal25                   monophasic         +-----------+--------+-----+--------+----------+--------+  Summary: Right: Total occlusion noted in the superficial femoral artery with reconstitution in the popliteal artery.  See table(s) above for measurements and observations. Electronically signed by Curt Jews MD on 02/20/2018 at 5:08:23 PM.    Final    Vas [place] Lower Extremity Venous (dvt)  Result Date: 03/01/2018  Lower Venous Study Indications: Edema, and Pain.  Limitations: Body habitus and severe edema,pain and sensitive for compression. Comparison Study: 12/14/2017 venous duplex exam Performing Technologist: Rudell Cobb  Examination Guidelines: A complete evaluation includes B-mode imaging, spectral Doppler, color Doppler, and power Doppler as needed of all accessible portions of each vessel. Bilateral testing is considered an integral part of a complete examination. Limited examinations for reoccurring indications may be performed as noted.  Right Venous Findings: +---+---------------+---------+-----------+----------+-------+    CompressibilityPhasicitySpontaneityPropertiesSummary +---+---------------+---------+-----------+----------+-------+ CFVFull           Yes      Yes                          +---+---------------+---------+-----------+----------+-------+  Left Venous Findings: +---------+---------------+---------+-----------+----------+-------+          CompressibilityPhasicitySpontaneityPropertiesSummary +---------+---------------+---------+-----------+----------+-------+ CFV      Full           Yes      Yes                          +---------+---------------+---------+-----------+----------+-------+ SFJ      Full                                                 +---------+---------------+---------+-----------+----------+-------+ FV Prox  Full                                                  +---------+---------------+---------+-----------+----------+-------+ FV Mid   Full                                                 +---------+---------------+---------+-----------+----------+-------+ FV DistalFull                                                 +---------+---------------+---------+-----------+----------+-------+ PFV      Full                                                 +---------+---------------+---------+-----------+----------+-------+ POP      Full           Yes      Yes                          +---------+---------------+---------+-----------+----------+-------+ PTV      Full                                                 +---------+---------------+---------+-----------+----------+-------+ PERO     Full                                                 +---------+---------------+---------+-----------+----------+-------+    Summary: Right: No evidence of common femoral vein obstruction. Left: There is no evidence of deep vein thrombosis in the lower extremity. However, portions of this examination were limited- see technologist comments above. No cystic structure found in the popliteal fossa.  *See table(s) above for measurements and observations. Electronically signed by Monica Martinez MD on 03/01/2018 at 3:03:08 PM.    Final     Microbiology: Recent Results (from the past 240 hour(s))  Urine Culture     Status: None   Collection Time: 02/28/18  8:54 PM  Result Value Ref Range Status  Specimen Description   Final    URINE, CLEAN CATCH Performed at [facility], [place] [address] [place], [place] [address]    Special Requests   Final    NONE Performed at [facility], [place] [address], [place], [place] [address]    Culture   Final    NO GROWTH Performed at [facility], [place] [address], [place],  [address]    Report Status 03/02/2018 FINAL  Final     Labs: Basic Metabolic  Panel: Recent Labs  Lab 03/01/18 0601 03/02/18 0553 03/03/18 0546 03/04/18 0657 03/05/18 0522 03/06/18 0629  NA 131* 133* 135 135 136 137  K 4.3 3.9 3.6 4.0 4.4 4.2  CL 86* 87* 86* 83* 86* 86*  CO2 37* 38* 40* 41* 42* 48*  GLUCOSE 100* 99 96 105* 100* 103*  BUN 13 12 13 15  25* 23  CREATININE 0.46 0.34* 0.43* 0.53 0.38* 0.46  CALCIUM 8.0* 7.7* 7.8* 8.3* 8.4* 8.3*  MG 2.1 2.0  --  2.2  --   --   PHOS 3.7  --   --   --   --   --    Liver Function Tests: Recent Labs  Lab 02/27/18 1545 02/28/18 1529 03/01/18 0601  AST 27 28 27   ALT 20 25 24   ALKPHOS 138* 113 119  BILITOT 0.4 0.5 0.6  PROT 5.8* 5.6* 5.6*  ALBUMIN 3.5 3.2* 3.2*   Recent Labs  Lab 02/27/18 1545  LIPASE 6.0*   No results for input(s): AMMONIA in the last 168 hours. CBC: Recent Labs  Lab 02/28/18 1527 03/01/18 0601 03/02/18 0553  WBC 8.0 5.6 7.2  NEUTROABS  --   --  5.6  HGB 14.4 15.1* 14.4  HCT 46.5* 49.6* 47.2*  MCV 95.1 94.3 94.4  PLT 221 229 217   Cardiac Enzymes: Recent Labs  Lab 02/28/18 2147 03/01/18 0244 03/01/18 0601  TROPONINI 0.06* 0.06* 0.06*   BNP: BNP (last 3 results) Recent Labs    02/28/18 1529 03/05/18 0522  BNP 772.7* 442.9*    ProBNP (last 3 results) Recent Labs    02/27/18 1545  PROBNP 749.0*    CBG: No results for input(s): GLUCAP in the last 168 hours.     Signed:  Florencia Reasons MD, PhD  [facility] 03/06/2018, 11:49 AM

## 2018-03-06 NOTE — Progress Notes (Signed)
This is an acute visit.  Level of care is skilled.  Facility is [facility].  Chief complaint acute visit status post hospitalization for acute respiratory failure with hypoxia with acute on chronic diastolic CHF and pulmonary hypertension.  History of present illness.  Patient is a 72 year old female seen today status post hospital discharge. Patient was hospitalized for increased edema and respiratory failure with a history of diastolic CHF as well as COPD with pulmonary hypertension.  She has a previous history of protein calorie malnutrition as well as osteoporosis with multiple fractures in the past with a recent hip replacement also a history of pancreatic mass hepatocellular carcinoma thought to be in remission.  On admission chest x-ray showed mild CHF vascular congestion and small effusions- echo showed grade 1 diastolic dysfunction with normal left ventricular ejection fraction troponin was mild and remained flat BNP was 372 she did get IV Lasix and has been discharged on p.o. Lasix.  She remains somewhat hypoxic-CAT scan was negative for a pulmonary embolism she is on oxygen to be weaned as tolerated.  She also had hyponatremia of a sodium of 129 on presentation thought this was from volume overload and normalized before discharge She does have a history of Isabela carcinoma-status post resection with negative margin this is being surveyed.  She also had advanced osteoporosis with multiple vertebral fractures fracture of the right femur as well as apparently a sternal fracture and rib fractures- she has been started on Evenity monthly- she is also on vitamin D with calcium supplementation- family has requested as little narcotic use is possible-.  She also has a significant history of depression and anxiety had been on Wellbutrin which helped depression but her daughter thought that it may have been making her more anxious and this is been tapered off- she has been started on  low-dose BuSpar and advisement to have neuro psychiatry follow-up.  Regards to severe protein calorie malnutrition with failure to thrive she will need dietary input- per discharge consideration for possible palliative care and if deemed appropriate possible hospice.  Currently patient was somewhat hypoxic when I first saw her but appears her oxygen was not working properly with oxygen her saturation increased fairly rapidly up into the high 80s- she did not appear to be uncomfortable but was somewhat anxious-vital signs are stable her systolic blood pressure is somewhat elevated but I suspect this is because of some anxiety agitation being in a new environment. During reassessments during the evening she appeared to be stable   Past Medical History:  Diagnosis Date  . Arthritis    DDD. Right shoulder"is frozen"-limited ROM. osteoporosis.  . Blue toe syndrome ([place]) 11/27/2014   Dr. Claudia Pollock evaluating  . Cancer ([place]) 12/2014   liver cancer  . Complication of anesthesia   . COPD (chronic obstructive pulmonary disease) ([place])   . Fibromyalgia   . Fracture of rib of right side    hx "osteoporosis"- states her dog nudge her on the side, next day developed great pain and was told has a fracture rib.  . Macular degeneration    R eye  . Osteoporosis   . PONV (postoperative nausea and vomiting)    nausea,severe vomiting after 01-13-15 portal vein embolization  . Productive cough   . Retina disorder    L eye, vision distorted, edema  . Spine fracture due to birth trauma   . TMJ disease   . Wears glasses  Past Surgical History:  Procedure Laterality Date  . ANGIOPLASTY  2008   no stents required, no follow-up with cardiologist, no recurrent chest pain  . CATARACT EXTRACTION, BILATERAL Bilateral   . ESOPHAGOGASTRODUODENOSCOPY (EGD) WITH PROPOFOL N/A 06/25/2015   Procedure: ESOPHAGOGASTRODUODENOSCOPY (EGD) WITH PROPOFOL;  Surgeon: Milus Banister, MD;   Location: [facility];  Service: Endoscopy;  Laterality: N/A;  stent removal   . EYE SURGERY     cornea surgery  . INTRAMEDULLARY (IM) NAIL INTERTROCHANTERIC Right 12/04/2017   Procedure: INTRAMEDULLARY (IM) NAIL INTERTROCHANTRIC;  Surgeon: Rod Can, MD;  Location: [facility];  Service: Orthopedics;  Laterality: Right;  . IR RADIOLOGIST EVAL & MGMT  03/29/2017  . KIDNEY STONE SURGERY    . LAPAROSCOPIC PARTIAL HEPATECTOMY N/A 03/04/2015   Procedure: DIAGNOSTIC LAPAROSCOPY, EXTENDED RIGHT HEPATECTOMY, WITH INTRAOPERATIVE ULTRASOUND;  Surgeon: Stark Klein, MD;  Location: [facility];  Service: General;  Laterality: N/A;  . PARTIAL HYSTERECTOMY    . portal vein embolization     01-13-15 -Dr. Cathlean Sauer.    Social History:  Ambulatory  Independently     reports that she has been smoking cigarettes. She has a 22.50 pack-year smoking history. She has never used smokeless tobacco. She reports that she does not drink alcohol or use drugs.     Family History:        Family History  Problem Relation Age of Onset  . Hypertension Mother   . Atrial fibrillation Mother   . Heart disease Mother        after age 65  . Stroke Father   . Dementia Father   . Heart disease Brother        After age 60- A-Fib  . Heart attack Brother     Allergies: Allergies  Allergen Reactions  . Sertraline Hcl     Zoloft, had a terrible reaction  . Amoxicillin-Pot Clavulanate Nausea Only    Has patient had a PCN reaction causing immediate rash, facial/tongue/throat swelling, SOB or lightheadedness with hypotension: No Has patient had a PCN reaction causing severe rash involving mucus membranes or skin necrosis: No Has patient had a PCN reaction that required hospitalization No Has patient had a PCN reaction occurring within the last 10 years: No If all of the above answers are "NO", then may proceed with Cephalosporin use.   . Clindamycin/Lincomycin Other (See  Comments)    Felt like it "burned out" her stomach/took a large dose  . Paroxetine Hcl Rash  . Sulfa Antibiotics Rash     MEDICATIONS  acetaminophen 325 MG tablet Commonly known as:  TYLENOL Take 2 tablets (650 mg total) by mouth every 6 (six) hours as needed for mild pain (or Fever >/= 101).   bisacodyl 10 MG suppository Commonly known as:  DULCOLAX Place 1 suppository (10 mg total) rectally daily as needed for moderate constipation.   busPIRone 5 MG tablet Commonly known as:  BUSPAR Take 1 tablet (5 mg total) by mouth 2 (two) times daily.   CALCIUM 500+D HIGH POTENCY PO Take 1 tablet by mouth daily.   CENTRUM SILVER ULTRA WOMENS PO Take 1 tablet by mouth daily.   EVENITY 105 MG/1.17ML Sosy injection Generic drug:  Romosozumab-aqqg Inject 210 mg into the skin once.   feeding supplement (ENSURE ENLIVE) Liqd Take 237 mLs by mouth 2 (two) times daily between meals.   furosemide 40 MG tablet Commonly known as:  LASIX Take 1 tablet (40 mg total) by mouth daily.   Glycopyrrolate-Formoterol 9-4.8  MCG/ACT Aero Commonly known as:  BEVESPI AEROSPHERE Inhale 2 puffs into the lungs 2 (two) times daily. What changed:    when to take this  reasons to take this   HYDROcodone-acetaminophen 5-325 MG tablet Commonly known as:  NORCO/VICODIN Take 1-2 tablets by mouth every 4 (four) hours as needed for moderate pain.   ipratropium-albuterol 0.5-2.5 (3) MG/3ML Soln Commonly known as:  DUONEB Take 3 mLs by nebulization every 4 (four) hours as needed (SOB).   naproxen sodium 220 MG tablet Commonly known as:  ALEVE Take 440 mg by mouth 3 (three) times daily as needed (pain).   polyethylene glycol packet Commonly known as:  MIRALAX / GLYCOLAX Take 17 g by mouth daily.   promethazine 12.5 MG tablet Commonly known as:  PHENERGAN Take 12.5 mg by mouth every 6 (six) hours as needed for nausea or vomiting.       senna-docusate 8.6-50 MG tablet Commonly  known as:  Senokot-S Take 1 tablet by mouth 2 (two) times daily.   thiamine 100 MG tablet Take 1 tablet (100 mg total) by mouth daily.   vitamin B-12 1000 MCG tablet Commonly known as:  CYANOCOBALAMIN Take 1,000 mcg by mouth daily.   Vitamin D3 125 MCG (5000 UT) Caps Take 5,000 Units by mouth daily.       Review of systems.  This is somewhat limited since patient is somewhat of a poor historian.  General she not complaining of fever chills does appear a bit anxious.  Skin does not complain of itching or diaphoresis.  Head ears eyes nose mouth and throat is not complaining of visual changes or sore throat.  Respiratory is not really complaining of shortness of breath I do not not note a cough at this point.  Cardiac she is not complaining of chest pain has fairly minimal lower extremity edema.  GI does not complain of abdominal discomfort.  GU does not complain of dysuria.  Musculoskeletal has general frailty is not overtly complaining of joint pain at this time but apparently does have a significant history with a history of fractures.  Neurologic is not complaining of a headache or dizziness.  And psych does appear somewhat anxious but is pleasant and cooperative with exam   Physical exam.  She is afebrile pulse of 96 respirations 20 blood pressure 156/90 O2 saturation is 89% on 2 L.  In general this is a frail elderly female who does not appear to be in any acute distress but is somewhat anxious.  Her skin is warm and dry.  Eyes visual acuity appears to be intact sclera and conjunctive are clear.  Oropharynx is clear mucous membranes moist.  Chest she has reduced air entry but cannot really appreciate any overt congestion cannot really appreciate labored breathing.  Heart is largely regular rate and rhythm at times borderline tachycardic has occasional irregular beats but this is not consistent.  She has fairly minimal lower extremity edema.  Her  abdomen is soft does not appear to be tender there are positive bowel sounds.  Musculoskeletal has general frailty but appears able to move all extremities x4.  Neurologic appears grossly intact her speech is clear.  Psych she is a bit anxious she is oriented to self can name her address can name the President of the [place] and what political party he is a member of- she also is oriented to year.  Labs.  March 06, 2018.  Sodium 137 potassium 4.2Husak who is BUN 23 creatinine 0.46  CO2 level 48-  March 05, 2018.  BNP 442.9.  March 02, 2018.  WBC 7.2 hemoglobin 14.4 platelets 217.  Assessment and plan.  1.-History of acute rest tori failure with diastolic CHF pulmonary hypertension and COPD- at this point monitor vital signs closely with pulse ox- goal O2 saturation is 88-92% titrate O2 accordingly.  Also monitor weights Monday Wednesday Friday notify provider of gain greater than 3 pounds.  She continues on Lasix 40 mg a day.  Note her CO2 level was rising today at 48 will have this updated.  At this point she does not appear to be in any distress but will have to be monitored.  2.  History of COPD as noted above she continues on duo nebs as needed she also has an order for Aerosphere.  At this point again titrate oxygen as noted above.  3.  History of osteoporosis with multiple fractures-she does have an order for Norco for pain again there is recommendation to try to minimize its use- she does have orders for naproxen as needed this will have to be encouraged.  She is also been started onEvenity  She is also on calcium with vitamin D.  4.  History of pancreatic mass with hepatocellular carcinoma and again margins were clear status post removal she is under surveillance.  5.-History of hyponatremia this was thought to be volume overload and did normalize on discharge will have this updated this appears to have normalized.  6.  History of  depression-anxiety-her Wellbutrin has been discontinued per family concerns this was causing additional anxiety and agitation- she has been started on low-dose BuSpar twice daily at this point will monitor.  7.  Constipation at this point will monitor apparently she did have a bowel movement before discharge she is on Dulcolax and MiraLAX.  [id number] note greater than 1 hour spent assessing patient reassessing patient reviewing her chart and labs and coordinating and formulating a plan of care for numerous diagnoses- of note greater than 50% of time spent coordinating a plan of care with input as noted above

## 2018-03-06 NOTE — Progress Notes (Signed)
Clinical Social Worker facilitated patient discharge including contacting patient family and facility to confirm patient discharge plans.  Clinical information faxed to facility and family agreeable with plan.  CSW arranged ambulance transport via [facility] to [facility] .  RN to call [phone] (pt will go in rm# [phone]) for report prior to discharge.  Clinical Social Worker will sign off for now as social work intervention is no longer needed. Please consult [place] again if new need arises.  Rhea Pink, MSW, [place]

## 2018-03-06 NOTE — Progress Notes (Signed)
Occupational Therapy Treatment Patient Details Name: Tina Bailey MRN: [id number] DOB: 01/14/1946 Today's Date: 03/06/2018    History of present illness 72 yo female admitted with leg edema, acute respiratory failure, CHF exac. hx of R femur fx-s/p IM nail 11/2016, multiple compression fxs, HCC, COPD, macular degeneration, severe osteoporosis   OT comments  Pt with decreased arousal this OT session.  Pt woke and agreed to sit EOB then fell back asleep in sitting.   Follow Up Recommendations  SNF    Equipment Recommendations  None recommended by OT    Recommendations for Other Services      Precautions / Restrictions Precautions Precautions: Fall Restrictions Weight Bearing Restrictions: No       Mobility Bed Mobility   Bed Mobility: Supine to Sit;Sit to Supine   Sidelying to sit: Max assist Supine to sit: Max assist     General bed mobility comments: pt needed increased A this day due to arousal.  Transfers                      Balance Overall balance assessment: Needs assistance Sitting-balance support: Feet supported;Bilateral upper extremity supported Sitting balance-Leahy Scale: Fair                                     ADL either performed or assessed with clinical judgement   ADL Overall ADL's : Needs assistance/impaired     Grooming: Maximal assistance;Sitting Grooming Details (indicate cue type and reason): Pt very sleepy this day. Pt did wake and agree to sitting but then fell asleep with sitting EOB with OT.  Pt returned to supine                                               Cognition Arousal/Alertness: Lethargic;Suspect due to medications   Overall Cognitive Status: Difficult to assess                                 General Comments: pt very sleepy this day          Home Living Family/patient expects to be discharged to:: Private residence Living Arrangements:  Children Available Help at Discharge: Family Type of Home: House Home Access: Stairs to enter Technical brewer of Steps: 2 Entrance Stairs-Rails: Right Home Layout: One level     Bathroom Shower/Tub: Tub/shower unit         Home Equipment: Environmental consultant - 4 wheels;Tub bench          Prior Functioning/Environment Level of Independence: Independent with assistive device(s)        Comments: using RW for ambulation   Frequency  Min 2X/week        Progress Toward Goals  OT Goals(current goals can now be found in the care plan section)  Progress towards OT goals: OT to reassess next treatment     Plan Discharge plan remains appropriate       AM-PAC OT "6 Clicks" Daily Activity     Outcome Measure   Help from another person eating meals?: A Lot Help from another person taking care of personal grooming?: A Lot Help from another person toileting, which includes using toliet, bedpan, or urinal?: Total Help from  another person bathing (including washing, rinsing, drying)?: A Lot Help from another person to put on and taking off regular upper body clothing?: A Lot Help from another person to put on and taking off regular lower body clothing?: Total 6 Click Score: 10    End of Session    OT Visit Diagnosis: Unsteadiness on feet (R26.81);Other abnormalities of gait and mobility (R26.89);History of falling (Z91.81);Muscle weakness (generalized) (M62.81)   Activity Tolerance Patient limited by lethargy   Patient Left in bed;with call bell/phone within reach;with bed alarm set   Nurse Communication Mobility status        Time: 9470-9628 OT Time Calculation (min): 9 min  Charges: OT General Charges $OT Visit: 1 Visit OT Treatments $Self Care/Home Management : 8-22 mins  Kari Baars, [place] [phone] Office- [phone]      Berta Denson, Edwena Felty D 03/06/2018, 12:24 PM

## 2018-03-06 NOTE — Progress Notes (Signed)
Pt trying to get out of bed constantly, stating "she hurts". When asked, pt could not tell RN where she hurt. RN gave PRN Vicodin with no relief, per pt. Pt became increasingly more noncompliant, pulling her oxygen off and trying to stand up. This RN notified on-call [facility] about this. On-call ordered 0.5 mg ativan and the order was carried out. Will continue to monitor the pt closely.

## 2018-03-06 NOTE — Progress Notes (Signed)
Report called to [facility]. Stacey Drain

## 2018-03-07 ENCOUNTER — Encounter: Payer: Self-pay | Admitting: Internal Medicine

## 2018-03-07 ENCOUNTER — Other Ambulatory Visit: Payer: Self-pay

## 2018-03-07 ENCOUNTER — Non-Acute Institutional Stay (SKILLED_NURSING_FACILITY): Payer: Medicare Other | Admitting: Internal Medicine

## 2018-03-07 ENCOUNTER — Encounter (HOSPITAL_COMMUNITY)
Admission: RE | Admit: 2018-03-07 | Discharge: 2018-03-07 | Disposition: A | Discharge status: Home / Self Care | Payer: Medicare Other | Source: Skilled Nursing Facility | Attending: Internal Medicine | Admitting: Internal Medicine

## 2018-03-07 DIAGNOSIS — D490 Neoplasm of unspecified behavior of digestive system: Secondary | ICD-10-CM | POA: Insufficient documentation

## 2018-03-07 DIAGNOSIS — I2510 Atherosclerotic heart disease of native coronary artery without angina pectoris: Secondary | ICD-10-CM | POA: Insufficient documentation

## 2018-03-07 DIAGNOSIS — I5033 Acute on chronic diastolic (congestive) heart failure: Secondary | ICD-10-CM | POA: Diagnosis not present

## 2018-03-07 DIAGNOSIS — E871 Hypo-osmolality and hyponatremia: Secondary | ICD-10-CM | POA: Diagnosis not present

## 2018-03-07 DIAGNOSIS — G894 Chronic pain syndrome: Secondary | ICD-10-CM

## 2018-03-07 DIAGNOSIS — F418 Other specified anxiety disorders: Secondary | ICD-10-CM | POA: Diagnosis not present

## 2018-03-07 DIAGNOSIS — J9601 Acute respiratory failure with hypoxia: Secondary | ICD-10-CM | POA: Diagnosis not present

## 2018-03-07 DIAGNOSIS — E43 Unspecified severe protein-calorie malnutrition: Secondary | ICD-10-CM | POA: Insufficient documentation

## 2018-03-07 DIAGNOSIS — J449 Chronic obstructive pulmonary disease, unspecified: Secondary | ICD-10-CM | POA: Insufficient documentation

## 2018-03-07 LAB — BASIC METABOLIC PANEL
Anion gap: 10 (ref 5–15)
BUN: 28 mg/dL — ABNORMAL HIGH (ref 8–23)
CO2: 42 mmol/L — ABNORMAL HIGH (ref 22–32)
Calcium: 9 mg/dL (ref 8.9–10.3)
Chloride: 86 mmol/L — ABNORMAL LOW (ref 98–111)
Creatinine, Ser: 0.52 mg/dL (ref 0.44–1.00)
GFR calc Af Amer: >60 mL/min (ref >60)
GFR calc non Af Amer: >60 mL/min (ref >60)
Glucose, Bld: 129 mg/dL — ABNORMAL HIGH (ref 70–99)
Potassium: 3.7 mmol/L (ref 3.5–5.1)
Sodium: 138 mmol/L (ref 135–145)

## 2018-03-07 LAB — CBC WITH DIFFERENTIAL/PLATELET
Abs Immature Granulocytes: 0.01 10*3/uL (ref 0.00–0.07)
Basophils Absolute: 0 10*3/uL (ref 0.0–0.1)
Basophils Relative: 0 %
Eosinophils Absolute: 0 10*3/uL (ref 0.0–0.5)
Eosinophils Relative: 0 %
HCT: 54 % — ABNORMAL HIGH (ref 36.0–46.0)
Hemoglobin: 16.1 g/dL — ABNORMAL HIGH (ref 12.0–15.0)
Immature Granulocytes: 0 %
Lymphocytes Relative: 10 %
Lymphs Abs: 0.7 10*3/uL (ref 0.7–4.0)
MCH: 28.2 pg (ref 26.0–34.0)
MCHC: 29.8 g/dL — ABNORMAL LOW (ref 30.0–36.0)
MCV: 94.6 fL (ref 80.0–100.0)
Monocytes Absolute: 0.6 10*3/uL (ref 0.1–1.0)
Monocytes Relative: 8 %
Neutro Abs: 5.6 10*3/uL (ref 1.7–7.7)
Neutrophils Relative %: 82 %
Platelets: 226 10*3/uL (ref 150–400)
RBC: 5.71 MIL/uL — ABNORMAL HIGH (ref 3.87–5.11)
RDW: 14.4 % (ref 11.5–15.5)
WBC: 6.9 10*3/uL (ref 4.0–10.5)
nRBC: 0 % (ref 0.0–0.2)

## 2018-03-07 MED ORDER — HYDROCODONE-ACETAMINOPHEN 5-325 MG PO TABS: 1.0000 | ORAL_TABLET | ORAL | 0 refills | Status: DC | PRN

## 2018-03-07 NOTE — Progress Notes (Signed)
Location:    [facility] Room Number: 152/D Place of Service:  SNF (31) Provider:  Velora Mediate, MD  Patient Care Team: Midge Minium, MD as PCP - General (Family Medicine) Elam Dutch, MD as Consulting Physician (Vascular Surgery) Stark Klein, MD as Consulting Physician (General Surgery) Truitt Merle, MD as Consulting Physician (Hematology) Suella Broad, MD as Consulting Physician (Physical Medicine and Rehabilitation) Valinda Party, MD as Consulting Physician (Rheumatology) Tanda Rockers, MD as Consulting Physician (Pulmonary Disease) Abbie Sons, MD as Referring Physician (Psychiatry)  Extended Emergency Contact Information Primary Emergency Contact: Dillard,Donna Address: [place], [place] [address] Johnnette Litter of [place] Phone: [phone] Mobile Phone: [phone] Relation: Daughter Secondary Emergency Contact: Dillard,David Address: [address] [place], [place] [address] Johnnette Litter of [place] Phone: [phone] Mobile Phone: [phone] Relation: Relative  Code Status: DNR  Goals of care: Advanced Directive information Advanced Directives 03/07/2018  Does Patient Have a Medical Advance Directive? Yes  Type of Advance Directive Out of facility DNR (pink MOST or yellow form)  Does patient want to make changes to medical advance directive? No - Patient declined  Copy of [place] in Chart? No - copy requested  Would patient like information on creating a medical advance directive? No - Patient declined  Pre-existing out of facility DNR order (yellow form or pink MOST form) -     Chief Complaint  Patient presents with  . Acute Visit    F/U Visit   Acute visit to follow-up acute respiratory failure with hypoxia as well as pain management and anxiety.   HPI:  Pt is a 72 y.o. female seen today for an acute visit for follow-up of the  above-stated issues.  I saw patient last night upon admission to skilled nursing- She was hospitalized for increased edema and respiratory failure with a history of diastolic CHF as well as COPD and pulmonary hypertension.  She also has protein calorie malnutrition as well as osteoporosis with multiple fractures in the past and a recent hip replacement as well as a pancreatic mass hepatocellular carcinoma that is thought to be in remission.  When I first saw her she was somewhat hypoxic last night but oxygen was not working and her level quickly rose once oxygen was corrected.  Goal is to keep her oxygen saturation in the high 80s and low 90s.  Her CO2 on lab yesterday was 48 however this is come down to 42 today which is encouraging.  She appears to be at her baseline today at times is somewhat anxious and agitated-currently she is resting in bed comfortably.    She has just been started on BuSpar.  Per pain management staff has been encouraging the naproxen-at times she will insist however on the narcotic although we are trying to minimize this  Oxygen saturations have been in the 90s staff is trying to keep this in the lower 90s- at times trying to have her keep her oxygen on is an issue and I suspect this will continue to be a challenge at times  Vital signs appear to be stable she is not really having any acute complaints with some palpation of her thorax area she does complain of pain she does have a history of a sternal fracture   Past Medical History:  Diagnosis Date  .  Arthritis    DDD. Right shoulder"is frozen"-limited ROM. osteoporosis.  . Blue toe syndrome ([place]) 11/27/2014   Dr. Claudia Pollock evaluating  . Cancer ([place]) 12/2014   liver cancer  . Complication of anesthesia   . COPD (chronic obstructive pulmonary disease) ([place])   . Fibromyalgia   . Fracture of rib of right side    hx "osteoporosis"- states her dog nudge her on the side, next day developed great pain and was  told has a fracture rib.  . Macular degeneration    R eye  . Osteoporosis   . PONV (postoperative nausea and vomiting)    nausea,severe vomiting after 01-13-15 portal vein embolization  . Productive cough   . Retina disorder    L eye, vision distorted, edema  . Spine fracture due to birth trauma   . TMJ disease   . Wears glasses    Past Surgical History:  Procedure Laterality Date  . ANGIOPLASTY  2008   no stents required, no follow-up with cardiologist, no recurrent chest pain  . CATARACT EXTRACTION, BILATERAL Bilateral   . ESOPHAGOGASTRODUODENOSCOPY (EGD) WITH PROPOFOL N/A 06/25/2015   Procedure: ESOPHAGOGASTRODUODENOSCOPY (EGD) WITH PROPOFOL;  Surgeon: Milus Banister, MD;  Location: [facility];  Service: Endoscopy;  Laterality: N/A;  stent removal   . EYE SURGERY     cornea surgery  . INTRAMEDULLARY (IM) NAIL INTERTROCHANTERIC Right 12/04/2017   Procedure: INTRAMEDULLARY (IM) NAIL INTERTROCHANTRIC;  Surgeon: Rod Can, MD;  Location: [facility];  Service: Orthopedics;  Laterality: Right;  . IR RADIOLOGIST EVAL & MGMT  03/29/2017  . KIDNEY STONE SURGERY    . LAPAROSCOPIC PARTIAL HEPATECTOMY N/A 03/04/2015   Procedure: DIAGNOSTIC LAPAROSCOPY, EXTENDED RIGHT HEPATECTOMY, WITH INTRAOPERATIVE ULTRASOUND;  Surgeon: Stark Klein, MD;  Location: [facility];  Service: General;  Laterality: N/A;  . PARTIAL HYSTERECTOMY    . portal vein embolization     01-13-15 -Dr. Cathlean Sauer.    Allergies  Allergen Reactions  . Sertraline Hcl     Zoloft, had a terrible reaction  . Amoxicillin-Pot Clavulanate Nausea Only    Has patient had a PCN reaction causing immediate rash, facial/tongue/throat swelling, SOB or lightheadedness with hypotension: No Has patient had a PCN reaction causing severe rash involving mucus membranes or skin necrosis: No Has patient had a PCN reaction that required hospitalization No Has patient had a PCN reaction occurring within the last 10 years: No If all of the  above answers are "NO", then may proceed with Cephalosporin use.   . Clindamycin/Lincomycin Other (See Comments)    Felt like it "burned out" her stomach/took a large dose  . Paroxetine Hcl Rash  . Sulfa Antibiotics Rash    Outpatient Encounter Medications as of 03/07/2018  Medication Sig  . acetaminophen (TYLENOL) 325 MG tablet Take 2 tablets (650 mg total) by mouth every 6 (six) hours as needed for mild pain (or Fever >/= 101).  . bisacodyl (DULCOLAX) 10 MG suppository Place 1 suppository (10 mg total) rectally daily as needed for moderate constipation.  . busPIRone (BUSPAR) 5 MG tablet Take 1 tablet (5 mg total) by mouth 2 (two) times daily.  . Calcium Carbonate-Vitamin D (CALCIUM 500+D HIGH POTENCY PO) Take 1 tablet by mouth daily.   . Cholecalciferol (VITAMIN D3) 5000 units CAPS Take 5,000 Units by mouth daily.  Marland Kitchen EVENITY 105 MG/1.17ML SOSY Inject 210 mg into the skin once.   . feeding supplement, ENSURE ENLIVE, (ENSURE ENLIVE) LIQD Take 237 mLs by mouth 2 (two) times daily  between meals.  . furosemide (LASIX) 40 MG tablet Take 1 tablet (40 mg total) by mouth daily.  . Glycopyrrolate-Formoterol (BEVESPI AEROSPHERE) 9-4.8 MCG/ACT AERO Inhale 2 puffs into the lungs 2 (two) times daily.  Marland Kitchen HYDROcodone-acetaminophen (NORCO/VICODIN) 5-325 MG tablet Take 1-2 tablets by mouth every 4 (four) hours as needed for moderate pain.  Marland Kitchen ipratropium-albuterol (DUONEB) 0.5-2.5 (3) MG/3ML SOLN Take 3 mLs by nebulization every 4 (four) hours as needed (SOB).   . Multiple Vitamins-Minerals (CENTRUM SILVER ULTRA WOMENS PO) Take 1 tablet by mouth daily.   . naproxen sodium (ALEVE) 220 MG tablet Take 440 mg by mouth 3 (three) times daily as needed (pain).   . polyethylene glycol (MIRALAX / GLYCOLAX) packet Take 17 g by mouth daily.  . promethazine (PHENERGAN) 12.5 MG tablet Take 12.5 mg by mouth every 6 (six) hours as needed for nausea or vomiting.  . senna-docusate (SENOKOT-S) 8.6-50 MG tablet Take 1 tablet  by mouth 2 (two) times daily.  Marland Kitchen thiamine 100 MG tablet Take 1 tablet (100 mg total) by mouth daily.  . vitamin B-12 (CYANOCOBALAMIN) 1000 MCG tablet Take 1,000 mcg by mouth daily.  . [DISCONTINUED] RELISTOR 150 MG TABS Take 3 tablets by mouth daily.   No facility-administered encounter medications on file as of 03/07/2018.     Review of Systems   This continues to be somewhat limited since patient appears to be a poor historian but she appears to be resting comfortably is not really complaining of shortness of breath apparently she has had pain complaints to staff and again they are trying to minimize her narcotic use.  In general she is not complain of fever chills.  Skin does not complain of itching or diaphoresis.  Head ears eyes nose mouth and throat no complaints of sore throat or visual changes.  Respiratory does not complain of shortness of breath have not noted a cough.  Cardiac does not complain of chest pain has some mild lower extremity edema more so over her feet.  GI does not complain of abdominal pain nursing does not report any nausea or vomiting apparently she has had a bowel movement in the hospital yesterday this will have to be monitored.  GU does not complain of a dysuria.  Musculoskeletal has had diffuse joint complaints pain complaints with a history of multiple fractures does not appear to be in pain at this point.  Neurologic does not complain of feeling dizzy or having a headache.  And psych continues at times with some agitation and anxiety  Immunization History  Administered Date(s) Administered  . Tdap 09/13/2013   Pertinent  Health Maintenance Due  Topic Date Due  . MAMMOGRAM  04/07/2018 (Originally 02/10/1995)  . PNA vac Low Risk Adult (1 of 2 - PCV13) 10/19/2018 (Originally 02/09/2010)  . INFLUENZA VACCINE  03/02/2020 (Originally 10/19/2017)  . COLONOSCOPY  09/29/2021  . DEXA SCAN  Completed   Fall Risk  10/18/2017 01/09/2017 10/12/2016 09/19/2016  07/13/2016  Falls in the past year? No No No No No   Functional Status Survey:    Vitals:   03/07/18 1031  BP: (!) 154/84  Pulse: (!) 101  Resp: 16  Temp: 98.2 F (36.8 C)  TempSrc: Oral  SpO2: 98%  Manual pulse was 96 Weight is 80.2 pounds Physical Exam   In general this is a frail elderly female in no distress at this point resting comfortably in bed.  Her skin is warm and dry.  She does have a bandage over  her right fourth finger apparently she did bump it--per nursing staff there is no sign of infection or concerning changes   Eyes visual acuity appears to be intact sclera and conjunctive are clear.  Oropharynx is clear mucous membranes moist.  Chest is clear to auscultation with very shallow air entry there is no labored breathing.  Heart is regular rate and rhythm without murmur gallop or rub at times apparently is borderline tachycardic especially when she gets anxious this occurs..  She has mild lower extremity edema most prominent in her feet Abdomen is soft does not appear to be tender there are positive bowel sounds.  Musculoskeletal does move all extremities x4 with general frailty this appears baseline with previous exam.  Neurologic she is alert and appears to be grossly intact cannot really appreciate lateralizing findings her speech is clear.  Psych she is oriented to self she is pleasant appropriate answers simple straightforward questions appropriately was able to tell me this was December and that Christmas is coming up was confused about day of week Labs reviewed: Recent Labs    12/06/17 0457  03/01/18 0601 03/02/18 0553  03/04/18 0657 03/05/18 0522 03/06/18 0629 03/07/18 0745  NA 134*   < > 131* 133*   < > 135 136 137 138  K 4.2   < > 4.3 3.9   < > 4.0 4.4 4.2 3.7  CL 97*   < > 86* 87*   < > 83* 86* 86* 86*  CO2 33*   < > 37* 38*   < > 41* 42* 48* 42*  GLUCOSE 106*   < > 100* 99   < > 105* 100* 103* 129*  BUN 19   < > 13 12   < > 15 25* 23  28*  CREATININE 0.50   < > 0.46 0.34*   < > 0.53 0.38* 0.46 0.52  CALCIUM 9.0   < > 8.0* 7.7*   < > 8.3* 8.4* 8.3* 9.0  MG 1.8  --  2.1 2.0  --  2.2  --   --   --   PHOS 2.6  --  3.7  --   --   --   --   --   --    < > = values in this interval not displayed.   Recent Labs    02/27/18 1545 02/28/18 1529 03/01/18 0601  AST 27 28 27   ALT 20 25 24   ALKPHOS 138* 113 119  BILITOT 0.4 0.5 0.6  PROT 5.8* 5.6* 5.6*  ALBUMIN 3.5 3.2* 3.2*   Recent Labs    12/06/17 0457  03/01/18 0601 03/02/18 0553 03/07/18 0745  WBC 9.2   < > 5.6 7.2 6.9  NEUTROABS 5.5  --   --  5.6 5.6  HGB 9.3*   < > 15.1* 14.4 16.1*  HCT 28.4*   < > 49.6* 47.2* 54.0*  MCV 94.0   < > 94.3 94.4 94.6  PLT 118*   < > 229 217 226   < > = values in this interval not displayed.   Lab Results  Component Value Date   TSH 1.262 03/01/2018   No results found for: HGBA1C Lab Results  Component Value Date   CHOL 188 01/09/2017   HDL 91.40 01/09/2017   LDLCALC 82 01/09/2017   TRIG 71.0 01/09/2017   CHOLHDL 2 01/09/2017    Significant Diagnostic Results in last 30 days:  Dg Chest 2 View  Result Date: 02/28/2018 CLINICAL DATA:  COPD and hyponatremia EXAM: CHEST - 2 VIEW COMPARISON:  01/11/2018 FINDINGS: Cardiac shadow is stable. Aortic calcifications are seen as well as tortuosity stable from the previous exam. Small new effusions are seen bilaterally. Mild increased vascular congestion is noted when compare with the prior exam. Old sternal fractures are noted multiple compression fractures are seen in the thoracic spine stable from the previous exam. No new focal abnormality is seen. IMPRESSION: Changes of mild CHF with vascular congestion and small effusions. Electronically Signed   By: Inez Catalina M.D.   On: 02/28/2018 16:28   Ct Angio Chest Pe W Or Wo Contrast  Result Date: 03/04/2018 CLINICAL DATA:  Acute respiratory failure with hypoxia, chronic diastolic CHF, pulmonary hypertension, BILATERAL lower extremity  edema, difficulty ambulating, question pulmonary embolism, interstitial lung disease EXAM: CT ANGIOGRAPHY CHEST WITH CONTRAST TECHNIQUE: Multidetector CT imaging of the chest was performed using the standard protocol during bolus administration of intravenous contrast. Multiplanar CT image reconstructions and MIPs were obtained to evaluate the vascular anatomy. CONTRAST:  16mL ISOVUE-370 IOPAMIDOL (ISOVUE-370) INJECTION 76% IV COMPARISON:  CT chest 12/25/2014 Correlation: Chest radiograph 02/29/2012 FINDINGS: Cardiovascular: Atherosclerotic calcifications aorta. Aneurysmal dilatation ascending thoracic aorta 4.2 cm diameter image 44. No evidence of aortic dissection. Pulmonary arteries well opacified and patent. No evidence of pulmonary embolism. No pericardial effusion. Mediastinum/Nodes: Esophagus unremarkable. No thoracic adenopathy. Base of cervical region normal appearance. Lungs/Pleura: Small RIGHT and tiny LEFT pleural effusions. Mild central peribronchial thickening. Lungs: Mildly hyperinflated. Minimal subsegmental atelectasis RIGHT upper lobe. Biapical scarring. No acute infiltrate, pleural effusion or pneumothorax. Interstitial changes seen on prior chest radiograph improved. Upper Abdomen: Visualized upper abdomen unremarkable. Musculoskeletal: Diffuse osseous demineralization. Prior sternal fractures. Pronounced thoracic kyphosis with multiple thoracic spine compression fractures at T3-T8, superior endplates of Y30-Z60, L1, L3. RIGHT glenohumeral degenerative changes. Review of the MIP images confirms the above findings. IMPRESSION: No evidence of pulmonary embolism. BILATERAL pleural effusions small RIGHT and tiny LEFT. Minimal subsegmental atelectasis RIGHT upper lobe. Osseous demineralization with multiple thoracic spine compression fractures and secondary thoracic kyphosis. Prior sternal fractures. Aneurysmal dilatation ascending thoracic aorta 4.2 cm diameter, recommendation below. Recommend  annual imaging followup by CTA or MRA. This recommendation follows 2010 ACCF/AHA/AATS/ACR/ASA/SCA/SCAI/SIR/STS/SVM Guidelines for the Diagnosis and Management of Patients with Thoracic Aortic Disease. Circulation. 2010; 121: F093-A355 Aortic Atherosclerosis (ICD10-I70.0). Aortic aneurysm NOS (ICD10-I71.9). Electronically Signed   By: Lavonia Dana M.D.   On: 03/04/2018 15:51   Vas [place] Burnard Bunting With/wo Tbi  Result Date: 02/20/2018 LOWER EXTREMITY DOPPLER STUDY Indications: Peripheral artery disease.  Performing Technologist: Kathrine Comfort RVT, RDCS  Examination Guidelines: A complete evaluation includes at minimum, Doppler waveform signals and systolic blood pressure reading at the level of bilateral brachial, anterior tibial, and posterior tibial arteries, when vessel segments are accessible. Bilateral testing is considered an integral part of a complete examination. Photoelectric Plethysmograph (PPG) waveforms and toe systolic pressure readings are included as required and additional duplex testing as needed. Limited examinations for reoccurring indications may be performed as noted.  ABI Findings: +---------+------------------+-----+----------+--------+ Right    Rt Pressure (mmHg)IndexWaveform  Comment  +---------+------------------+-----+----------+--------+ Brachial 135                                       +---------+------------------+-----+----------+--------+ PTA      92                0.68 monophasic         +---------+------------------+-----+----------+--------+  DP       92                0.68 monophasic         +---------+------------------+-----+----------+--------+ Great Toe74                0.55 Abnormal           +---------+------------------+-----+----------+--------+ +---------+------------------+-----+----------+-------+ Left     Lt Pressure (mmHg)IndexWaveform  Comment +---------+------------------+-----+----------+-------+ Brachial 135                                       +---------+------------------+-----+----------+-------+ PTA      113               0.84 monophasic        +---------+------------------+-----+----------+-------+ DP       112               0.83 monophasic        +---------+------------------+-----+----------+-------+ Great Toe79                0.59 Abnormal          +---------+------------------+-----+----------+-------+ +-------+-----------+-----------+------------+------------+ ABI/TBIToday's ABIToday's TBIPrevious ABIPrevious TBI +-------+-----------+-----------+------------+------------+ Right  0.68       0.55       0.66        0.28         +-------+-----------+-----------+------------+------------+ Left   0.84       0.59       0.78        0.62         +-------+-----------+-----------+------------+------------+ Right ABIs appear essentially unchanged. Left ABIs appear increased.  Summary: Right: Resting right ankle-brachial index indicates moderate right lower extremity arterial disease. The right toe-brachial index is abnormal. Left: Resting left ankle-brachial index indicates mild left lower extremity arterial disease. The left toe-brachial index is abnormal.  *See table(s) above for measurements and observations.  Electronically signed by Curt Jews MD on 02/20/2018 at 5:08:04 PM.   Final    Vas [place] Lower Extremity Arterial Duplex  Result Date: 02/20/2018 LOWER EXTREMITY ARTERIAL DUPLEX STUDY Indications: Peripheral artery disease.  Current ABI: RT= 0.68 LT= 0.84 Performing Technologist: Kathrine Comfort RVT, RDCS  Examination Guidelines: A complete evaluation includes B-mode imaging, spectral Doppler, color Doppler, and power Doppler as needed of all accessible portions of each vessel. Bilateral testing is considered an integral part of a complete examination. Limited examinations for reoccurring indications may be performed as noted.  Right Duplex Findings:  +-----------+--------+-----+--------+----------+--------+            PSV cm/sRatioStenosisWaveform  Comments +-----------+--------+-----+--------+----------+--------+ CFA Distal 70                   biphasic           +-----------+--------+-----+--------+----------+--------+ DFA        55                   biphasic           +-----------+--------+-----+--------+----------+--------+ SFA Prox   0            occluded                   +-----------+--------+-----+--------+----------+--------+ SFA Mid    0            occluded                   +-----------+--------+-----+--------+----------+--------+  SFA Distal 0            occluded                   +-----------+--------+-----+--------+----------+--------+ POP Prox   40                   monophasic         +-----------+--------+-----+--------+----------+--------+ POP Distal 42                   monophasic         +-----------+--------+-----+--------+----------+--------+ ATA Distal 29                                      +-----------+--------+-----+--------+----------+--------+ PTA Distal 16                   monophasic         +-----------+--------+-----+--------+----------+--------+ PERO Distal25                   monophasic         +-----------+--------+-----+--------+----------+--------+  Summary: Right: Total occlusion noted in the superficial femoral artery with reconstitution in the popliteal artery.  See table(s) above for measurements and observations. Electronically signed by Curt Jews MD on 02/20/2018 at 5:08:23 PM.    Final    Vas [place] Lower Extremity Venous (dvt)  Result Date: 03/01/2018  Lower Venous Study Indications: Edema, and Pain.  Limitations: Body habitus and severe edema,pain and sensitive for compression. Comparison Study: 12/14/2017 venous duplex exam Performing Technologist: Rudell Cobb  Examination Guidelines: A complete evaluation includes B-mode imaging, spectral  Doppler, color Doppler, and power Doppler as needed of all accessible portions of each vessel. Bilateral testing is considered an integral part of a complete examination. Limited examinations for reoccurring indications may be performed as noted.  Right Venous Findings: +---+---------------+---------+-----------+----------+-------+    CompressibilityPhasicitySpontaneityPropertiesSummary +---+---------------+---------+-----------+----------+-------+ CFVFull           Yes      Yes                          +---+---------------+---------+-----------+----------+-------+  Left Venous Findings: +---------+---------------+---------+-----------+----------+-------+          CompressibilityPhasicitySpontaneityPropertiesSummary +---------+---------------+---------+-----------+----------+-------+ CFV      Full           Yes      Yes                          +---------+---------------+---------+-----------+----------+-------+ SFJ      Full                                                 +---------+---------------+---------+-----------+----------+-------+ FV Prox  Full                                                 +---------+---------------+---------+-----------+----------+-------+ FV Mid   Full                                                 +---------+---------------+---------+-----------+----------+-------+  FV DistalFull                                                 +---------+---------------+---------+-----------+----------+-------+ PFV      Full                                                 +---------+---------------+---------+-----------+----------+-------+ POP      Full           Yes      Yes                          +---------+---------------+---------+-----------+----------+-------+ PTV      Full                                                 +---------+---------------+---------+-----------+----------+-------+ PERO     Full                                                  +---------+---------------+---------+-----------+----------+-------+    Summary: Right: No evidence of common femoral vein obstruction. Left: There is no evidence of deep vein thrombosis in the lower extremity. However, portions of this examination were limited- see technologist comments above. No cystic structure found in the popliteal fossa.  *See table(s) above for measurements and observations. Electronically signed by Monica Martinez MD on 03/01/2018 at 3:03:08 PM.    Final     Assessment/Plan  #1 history of acute respiratory failure with diastolic CHF pulmonary hypertension COPD- at this point she appears relatively baseline she appears a bit more comfortable than she did last night- again goal O2 saturation is high 80s low 90s- I suspect keeping her oxygen on will be somewhat of an issue but clinically she appears to be stable relatively speaking.  Weights will be monitored her weight is essentially 80 pounds today this will have to be watched she continues on Lasix 40 mg a day.  CO2 level actually is gone down today at 42-at this point continue current medications including Lasix 40 mg a day.  2.  History of hyponatremia this remains stable with a sodium of 138 today-hyponatremia in hospital was thought to be volume overload related.  3.-  History of severe protein calorie malnutrition she will need follow-up by dietary- at this point monitor continue supportive care.  4.  History of depression with anxiety-her Wellbutrin was discontinued hospital because of family concerns this was causing additional anxiety and agitation- she has just been started on low-dose BuSpar at this point will monitor.  At this point would like to avoid use of benzodiazepines --- I suspect this could be a challenging situation.  #5 hypertension?  Her systolics are somewhat elevated but she is somewhat anxious and agitated at times moving to a new environment at this point  will monitor before doing aggressive interventions- would like to avoid hypotension I suspect she is a fall risk  #6 history of  multiple fractures and chronic pain-she does have a order for naproxen-staff has been encouraging this instead of the Vicodin but apparently patient at times is insistent on the narcotic again at this point continue to try to minimize  [id number]-of note greater than 25-minute spent assessing patient reviewing her chart and labs discussing her status with nursing staff and coordinating and formulating a plan of care- of note greater than 50% of time spent coordinating a plan of care with input as noted above

## 2018-03-07 NOTE — Progress Notes (Deleted)
Location:    [facility] Room Number: 152/D Place of Service:  SNF (31) Provider: Granville Lewis PA-C  Midge Minium, MD  Patient Care Team: Midge Minium, MD as PCP - General (Family Medicine) Elam Dutch, MD as Consulting Physician (Vascular Surgery) Stark Klein, MD as Consulting Physician (General Surgery) Truitt Merle, MD as Consulting Physician (Hematology) Suella Broad, MD as Consulting Physician (Physical Medicine and Rehabilitation) Valinda Party, MD as Consulting Physician (Rheumatology) Tanda Rockers, MD as Consulting Physician (Pulmonary Disease) Abbie Sons, MD as Referring Physician (Psychiatry)  Extended Emergency Contact Information Primary Emergency Contact: Dillard,Donna Address: [place], [place] [address] Johnnette Litter of [place] Phone: [phone] Mobile Phone: [phone] Relation: Daughter Secondary Emergency Contact: Dillard,David Address: [address] [place], [place] [address] Johnnette Litter of [place] Phone: [phone] Mobile Phone: [phone] Relation: Relative  Code Status:  DNR Goals of care: Advanced Directive information Advanced Directives 03/07/2018  Does Patient Have a Medical Advance Directive? Yes  Type of Advance Directive Out of facility DNR (pink MOST or yellow form)  Does patient want to make changes to medical advance directive? No - Patient declined  Copy of [place] in Chart? No - copy requested  Would patient like information on creating a medical advance directive? No - Patient declined  Pre-existing out of facility DNR order (yellow form or pink MOST form) -     Chief Complaint  Patient presents with  . Hospitalization Follow-up    Hospitalization F/U Visit    HPI:  Pt is a 72 y.o. female seen today for a hospital f/u s/p admission from  Past Medical History:  Diagnosis Date  . Arthritis    DDD. Right  shoulder"is frozen"-limited ROM. osteoporosis.  . Blue toe syndrome ([place]) 11/27/2014   Dr. Claudia Pollock evaluating  . Cancer ([place]) 12/2014   liver cancer  . Complication of anesthesia   . COPD (chronic obstructive pulmonary disease) ([place])   . Fibromyalgia   . Fracture of rib of right side    hx "osteoporosis"- states her dog nudge her on the side, next day developed great pain and was told has a fracture rib.  . Macular degeneration    R eye  . Osteoporosis   . PONV (postoperative nausea and vomiting)    nausea,severe vomiting after 01-13-15 portal vein embolization  . Productive cough   . Retina disorder    L eye, vision distorted, edema  . Spine fracture due to birth trauma   . TMJ disease   . Wears glasses    Past Surgical History:  Procedure Laterality Date  . ANGIOPLASTY  2008   no stents required, no follow-up with cardiologist, no recurrent chest pain  . CATARACT EXTRACTION, BILATERAL Bilateral   . ESOPHAGOGASTRODUODENOSCOPY (EGD) WITH PROPOFOL N/A 06/25/2015   Procedure: ESOPHAGOGASTRODUODENOSCOPY (EGD) WITH PROPOFOL;  Surgeon: Milus Banister, MD;  [facility];  Service: Endoscopy;  Laterality: N/A;  stent removal   . EYE SURGERY     cornea surgery  . INTRAMEDULLARY (IM) NAIL INTERTROCHANTERIC Right 12/04/2017   Procedure: INTRAMEDULLARY (IM) NAIL INTERTROCHANTRIC;  Surgeon: Rod Can, MD;  Location: [facility];  Service: Orthopedics;  Laterality: Right;  . IR RADIOLOGIST EVAL & MGMT  03/29/2017  . KIDNEY STONE SURGERY    . LAPAROSCOPIC PARTIAL HEPATECTOMY N/A 03/04/2015   Procedure: DIAGNOSTIC LAPAROSCOPY,  EXTENDED RIGHT HEPATECTOMY, WITH INTRAOPERATIVE ULTRASOUND;  Surgeon: Stark Klein, MD;  [facility];  Service: General;  Laterality: N/A;  . PARTIAL HYSTERECTOMY    . portal vein embolization     01-13-15 -Dr. Cathlean Sauer.    Allergies  Allergen Reactions  . Sertraline Hcl     Zoloft, had a terrible reaction  . Amoxicillin-Pot Clavulanate  Nausea Only    Has patient had a PCN reaction causing immediate rash, facial/tongue/throat swelling, SOB or lightheadedness with hypotension: No Has patient had a PCN reaction causing severe rash involving mucus membranes or skin necrosis: No Has patient had a PCN reaction that required hospitalization No Has patient had a PCN reaction occurring within the last 10 years: No If all of the above answers are "NO", then may proceed with Cephalosporin use.   . Clindamycin/Lincomycin Other (See Comments)    Felt like it "burned out" her stomach/took a large dose  . Paroxetine Hcl Rash  . Sulfa Antibiotics Rash    Outpatient Encounter Medications as of 03/07/2018  Medication Sig  . acetaminophen (TYLENOL) 325 MG tablet Take 2 tablets (650 mg total) by mouth every 6 (six) hours as needed for mild pain (or Fever >/= 101).  . bisacodyl (DULCOLAX) 10 MG suppository Place 1 suppository (10 mg total) rectally daily as needed for moderate constipation.  . busPIRone (BUSPAR) 5 MG tablet Take 1 tablet (5 mg total) by mouth 2 (two) times daily.  . Calcium Carbonate-Vitamin D (CALCIUM 500+D HIGH POTENCY PO) Take 1 tablet by mouth daily.   . Cholecalciferol (VITAMIN D3) 5000 units CAPS Take 5,000 Units by mouth daily.  Marland Kitchen EVENITY 105 MG/1.17ML SOSY Inject 210 mg into the skin once.   . feeding supplement, ENSURE ENLIVE, (ENSURE ENLIVE) LIQD Take 237 mLs by mouth 2 (two) times daily between meals.  . furosemide (LASIX) 40 MG tablet Take 1 tablet (40 mg total) by mouth daily.  . Glycopyrrolate-Formoterol (BEVESPI AEROSPHERE) 9-4.8 MCG/ACT AERO Inhale 2 puffs into the lungs 2 (two) times daily.  Marland Kitchen HYDROcodone-acetaminophen (NORCO/VICODIN) 5-325 MG tablet Take 1-2 tablets by mouth every 4 (four) hours as needed for moderate pain.  Marland Kitchen ipratropium-albuterol (DUONEB) 0.5-2.5 (3) MG/3ML SOLN Take 3 mLs by nebulization every 4 (four) hours as needed (SOB).   . Multiple Vitamins-Minerals (CENTRUM SILVER ULTRA WOMENS PO)  Take 1 tablet by mouth daily.   . naproxen sodium (ALEVE) 220 MG tablet Take 440 mg by mouth 3 (three) times daily as needed (pain).   . polyethylene glycol (MIRALAX / GLYCOLAX) packet Take 17 g by mouth daily.  . promethazine (PHENERGAN) 12.5 MG tablet Take 12.5 mg by mouth every 6 (six) hours as needed for nausea or vomiting.  . senna-docusate (SENOKOT-S) 8.6-50 MG tablet Take 1 tablet by mouth 2 (two) times daily.  Marland Kitchen thiamine 100 MG tablet Take 1 tablet (100 mg total) by mouth daily.  . vitamin B-12 (CYANOCOBALAMIN) 1000 MCG tablet Take 1,000 mcg by mouth daily.  . [DISCONTINUED] RELISTOR 150 MG TABS Take 3 tablets by mouth daily.   No facility-administered encounter medications on file as of 03/07/2018.      Review of Systems  Immunization History  Administered Date(s) Administered  . Tdap 09/13/2013   Pertinent  Health Maintenance Due  Topic Date Due  . MAMMOGRAM  04/07/2018 (Originally 02/10/1995)  . PNA vac Low Risk Adult (1 of 2 - PCV13) 10/19/2018 (Originally 02/09/2010)  . INFLUENZA VACCINE  03/02/2020 (Originally 10/19/2017)  . COLONOSCOPY  09/29/2021  .  DEXA SCAN  Completed   Fall Risk  10/18/2017 01/09/2017 10/12/2016 09/19/2016 07/13/2016  Falls in the past year? No No No No No   Functional Status Survey:    Vitals:   03/07/18 1031  BP: (!) 154/84  Pulse: (!) 101  Resp: 16  Temp: 98.2 F (36.8 C)  TempSrc: Oral  SpO2: 98%   There is no height or weight on file to calculate BMI. Physical Exam  Labs reviewed: Recent Labs    12/06/17 0457  03/01/18 0601 03/02/18 0553  03/04/18 0657 03/05/18 0522 03/06/18 0629 03/07/18 0745  NA 134*   < > 131* 133*   < > 135 136 137 138  K 4.2   < > 4.3 3.9   < > 4.0 4.4 4.2 3.7  CL 97*   < > 86* 87*   < > 83* 86* 86* 86*  CO2 33*   < > 37* 38*   < > 41* 42* 48* 42*  GLUCOSE 106*   < > 100* 99   < > 105* 100* 103* 129*  BUN 19   < > 13 12   < > 15 25* 23 28*  CREATININE 0.50   < > 0.46 0.34*   < > 0.53 0.38* 0.46 0.52    CALCIUM 9.0   < > 8.0* 7.7*   < > 8.3* 8.4* 8.3* 9.0  MG 1.8  --  2.1 2.0  --  2.2  --   --   --   PHOS 2.6  --  3.7  --   --   --   --   --   --    < > = values in this interval not displayed.   Recent Labs    02/27/18 1545 02/28/18 1529 03/01/18 0601  AST 27 28 27   ALT 20 25 24   ALKPHOS 138* 113 119  BILITOT 0.4 0.5 0.6  PROT 5.8* 5.6* 5.6*  ALBUMIN 3.5 3.2* 3.2*   Recent Labs    12/06/17 0457  03/01/18 0601 03/02/18 0553 03/07/18 0745  WBC 9.2   < > 5.6 7.2 6.9  NEUTROABS 5.5  --   --  5.6 5.6  HGB 9.3*   < > 15.1* 14.4 16.1*  HCT 28.4*   < > 49.6* 47.2* 54.0*  MCV 94.0   < > 94.3 94.4 94.6  PLT 118*   < > 229 217 226   < > = values in this interval not displayed.   Lab Results  Component Value Date   TSH 1.262 03/01/2018   No results found for: HGBA1C Lab Results  Component Value Date   CHOL 188 01/09/2017   HDL 91.40 01/09/2017   LDLCALC 82 01/09/2017   TRIG 71.0 01/09/2017   CHOLHDL 2 01/09/2017    Significant Diagnostic Results in last 30 days:  No results found.  Assessment/Plan There are no diagnoses linked to this encounter.   Family/ staff Communication:   Labs/tests ordered:

## 2018-03-07 NOTE — Telephone Encounter (Signed)
RX Fax for [facility]@ [phone]  

## 2018-03-08 ENCOUNTER — Non-Acute Institutional Stay (SKILLED_NURSING_FACILITY): Payer: Medicare Other | Admitting: Internal Medicine

## 2018-03-08 ENCOUNTER — Encounter: Payer: Self-pay | Admitting: Internal Medicine

## 2018-03-08 DIAGNOSIS — E440 Moderate protein-calorie malnutrition: Secondary | ICD-10-CM

## 2018-03-08 DIAGNOSIS — M8000XG Age-related osteoporosis with current pathological fracture, unspecified site, subsequent encounter for fracture with delayed healing: Secondary | ICD-10-CM | POA: Diagnosis not present

## 2018-03-08 DIAGNOSIS — F332 Major depressive disorder, recurrent severe without psychotic features: Secondary | ICD-10-CM

## 2018-03-08 DIAGNOSIS — I5033 Acute on chronic diastolic (congestive) heart failure: Secondary | ICD-10-CM | POA: Diagnosis not present

## 2018-03-08 DIAGNOSIS — J449 Chronic obstructive pulmonary disease, unspecified: Secondary | ICD-10-CM | POA: Diagnosis not present

## 2018-03-08 DIAGNOSIS — K5900 Constipation, unspecified: Secondary | ICD-10-CM

## 2018-03-08 DIAGNOSIS — Z720 Tobacco use: Secondary | ICD-10-CM

## 2018-03-08 DIAGNOSIS — E871 Hypo-osmolality and hyponatremia: Secondary | ICD-10-CM

## 2018-03-08 DIAGNOSIS — F17210 Nicotine dependence, cigarettes, uncomplicated: Secondary | ICD-10-CM

## 2018-03-08 NOTE — Progress Notes (Signed)
Provider: Veleta Miners MD  Location:    [facility] Room Number: 152/D Place of Service:  SNF (31)  PCP: Midge Minium, MD Patient Care Team: Midge Minium, MD as PCP - General (Family Medicine) Elam Dutch, MD as Consulting Physician (Vascular Surgery) Stark Klein, MD as Consulting Physician (General Surgery) Truitt Merle, MD as Consulting Physician (Hematology) Suella Broad, MD as Consulting Physician (Physical Medicine and Rehabilitation) Valinda Party, MD as Consulting Physician (Rheumatology) Tanda Rockers, MD as Consulting Physician (Pulmonary Disease) Abbie Sons, MD as Referring Physician (Psychiatry)  Extended Emergency Contact Information Primary Emergency Contact: Dillard,Donna Address: [place], [place] [address] Johnnette Litter of [place] Phone: [phone] Mobile Phone: [phone] Relation: Daughter Secondary Emergency Contact: Dillard,David Address: [address] [place], [place] [address] Johnnette Litter of [place] Phone: [phone] Mobile Phone: [phone] Relation: Relative  Code Status: DNR Goals of Care: Advanced Directive information Advanced Directives 03/08/2018  Does Patient Have a Medical Advance Directive? Yes  Type of Advance Directive Out of facility DNR (pink MOST or yellow form)  Does patient want to make changes to medical advance directive? No - Patient declined  Copy of [place] in Chart? No - copy requested  Would patient like information on creating a medical advance directive? No - Patient declined  Pre-existing out of facility DNR order (yellow form or pink MOST form) -      Chief Complaint  Patient presents with  . New Admit To SNF    New Admission Visit    HPI: Patient is a 72 y.o. female seen today for admission to SNF for therapy.  She stayed in the hospital from 12/11-12/17 for LE edema and New onset of Diastolic  CHF.  Has h/o Osteoporosis with Right Hip Fractures, Compression Fractures in Back and Rib fractures, H/o COPD still Smokes, Hepatocellular Cancer in Remission, PAD,adn Failure to thrive.  Patient unable to give me any history.  Per medical records patient was admitted with worsening lower extremity edema.  Her Dopplers were negative for any DVT.  Her chest x-ray was positive for CHF.  And BNP was elevated.  her echo showed diastolic dysfunction.  She was treated with IV Lasix which was later changed to p.o.  Her CTA was negative for any PE.  He is now in SNF for therapy. Her daughter had told the facility that she is unable to take care of her mom at home.  Patient has been living with her daughter for the past 10 years.  The patient got very upset.  She had refuses to take her medicines.  Refusing to eat.  And refusing nursing care. Patient looks very anxious was mumbling and would not talk to me.  She is also refusing therapy.  Past Medical History:  Diagnosis Date  . Arthritis    DDD. Right shoulder"is frozen"-limited ROM. osteoporosis.  . Blue toe syndrome ([place]) 11/27/2014   Dr. Claudia Pollock evaluating  . Cancer ([place]) 12/2014   liver cancer  . Complication of anesthesia   . COPD (chronic obstructive pulmonary disease) ([place])   . Fibromyalgia   . Fracture of rib of right side    hx "osteoporosis"- states her dog nudge her on the side, next day developed great pain and was told has a fracture rib.  . Macular degeneration    R eye  .  Osteoporosis   . PONV (postoperative nausea and vomiting)    nausea,severe vomiting after 01-13-15 portal vein embolization  . Productive cough   . Retina disorder    L eye, vision distorted, edema  . Spine fracture due to birth trauma   . TMJ disease   . Wears glasses    Past Surgical History:  Procedure Laterality Date  . ANGIOPLASTY  2008   no stents required, no follow-up with cardiologist, no recurrent chest pain  . CATARACT EXTRACTION, BILATERAL  Bilateral   . ESOPHAGOGASTRODUODENOSCOPY (EGD) WITH PROPOFOL N/A 06/25/2015   Procedure: ESOPHAGOGASTRODUODENOSCOPY (EGD) WITH PROPOFOL;  Surgeon: Milus Banister, MD;  [facility];  Service: Endoscopy;  Laterality: N/A;  stent removal   . EYE SURGERY     cornea surgery  . INTRAMEDULLARY (IM) NAIL INTERTROCHANTERIC Right 12/04/2017   Procedure: INTRAMEDULLARY (IM) NAIL INTERTROCHANTRIC;  Surgeon: Rod Can, MD;  [facility];  Service: Orthopedics;  Laterality: Right;  . IR RADIOLOGIST EVAL & MGMT  03/29/2017  . KIDNEY STONE SURGERY    . LAPAROSCOPIC PARTIAL HEPATECTOMY N/A 03/04/2015   Procedure: DIAGNOSTIC LAPAROSCOPY, EXTENDED RIGHT HEPATECTOMY, WITH INTRAOPERATIVE ULTRASOUND;  Surgeon: Stark Klein, MD;  [facility];  Service: General;  Laterality: N/A;  . PARTIAL HYSTERECTOMY    . portal vein embolization     01-13-15 -Dr. Cathlean Sauer.    reports that she has been smoking cigarettes. She has a 22.50 pack-year smoking history. She has never used smokeless tobacco. She reports that she does not drink alcohol or use drugs. Social History   Socioeconomic History  . Marital status: Divorced    Spouse name: Not on file  . Number of children: Not on file  . Years of education: Not on file  . Highest education level: Not on file  Occupational History  . Not on file  Social Needs  . Financial resource strain: Not on file  . Food insecurity:    Worry: Not on file    Inability: Not on file  . Transportation needs:    Medical: Not on file    Non-medical: Not on file  Tobacco Use  . Smoking status: Current Every Day Smoker    Packs/day: 0.50    Years: 45.00    Pack years: 22.50    Types: Cigarettes  . Smokeless tobacco: Never Used  Substance and Sexual Activity  . Alcohol use: No    Alcohol/week: 0.0 standard drinks  . Drug use: No  . Sexual activity: Not on file  Lifestyle  . Physical activity:    Days per week: Not on file    Minutes per session:  Not on file  . Stress: Not on file  Relationships  . Social connections:    Talks on phone: Not on file    Gets together: Not on file    Attends religious service: Not on file    Active member of club or organization: Not on file    Attends meetings of clubs or organizations: Not on file    Relationship status: Not on file  . Intimate partner violence:    Fear of current or ex partner: Not on file    Emotionally abused: Not on file    Physically abused: Not on file    Forced sexual activity: Not on file  Other Topics Concern  . Not on file  Social History Narrative  . Not on file    Functional Status Survey:    Family History  Problem Relation Age of Onset  . Hypertension Mother   . Atrial fibrillation Mother   . Heart disease Mother        after age 80  . Stroke Father   . Dementia Father   . Heart disease Brother        After age 10- A-Fib  . Heart attack Brother     Health Maintenance  Topic Date Due  . MAMMOGRAM  04/07/2018 (Originally 02/10/1995)  . Hepatitis C Screening  10/19/2018 (Originally 20-Nov-1945)  . PNA vac Low Risk Adult (1 of 2 - PCV13) 10/19/2018 (Originally 02/09/2010)  . INFLUENZA VACCINE  03/02/2020 (Originally 10/19/2017)  . COLONOSCOPY  09/29/2021  . TETANUS/TDAP  09/14/2023  . DEXA SCAN  Completed    Allergies  Allergen Reactions  . Sertraline Hcl     Zoloft, had a terrible reaction  . Amoxicillin-Pot Clavulanate Nausea Only    Has patient had a PCN reaction causing immediate rash, facial/tongue/throat swelling, SOB or lightheadedness with hypotension: No Has patient had a PCN reaction causing severe rash involving mucus membranes or skin necrosis: No Has patient had a PCN reaction that required hospitalization No Has patient had a PCN reaction occurring within the last 10 years: No If all of the above answers are "NO", then may proceed with Cephalosporin use.   . Clindamycin/Lincomycin Other (See Comments)    Felt like it "burned out"  her stomach/took a large dose  . Paroxetine Hcl Rash  . Sulfa Antibiotics Rash    Outpatient Encounter Medications as of 03/08/2018  Medication Sig  . acetaminophen (TYLENOL) 325 MG tablet Take 2 tablets (650 mg total) by mouth every 6 (six) hours as needed for mild pain (or Fever >/= 101).  . bisacodyl (DULCOLAX) 10 MG suppository Place 1 suppository (10 mg total) rectally daily as needed for moderate constipation.  . busPIRone (BUSPAR) 5 MG tablet Take 1 tablet (5 mg total) by mouth 2 (two) times daily.  . Calcium Carbonate-Vitamin D (CALCIUM 500+D HIGH POTENCY PO) Take 1 tablet by mouth daily.   . Cholecalciferol (VITAMIN D3) 5000 units CAPS Take 5,000 Units by mouth daily.  Marland Kitchen EVENITY 105 MG/1.17ML SOSY Inject 210 mg into the skin once. Take once a month on the 4th of the month  . feeding supplement, ENSURE ENLIVE, (ENSURE ENLIVE) LIQD Take 237 mLs by mouth 2 (two) times daily between meals.  . furosemide (LASIX) 40 MG tablet Take 1 tablet (40 mg total) by mouth daily.  . Glycopyrrolate-Formoterol (BEVESPI AEROSPHERE) 9-4.8 MCG/ACT AERO Inhale 2 puffs into the lungs 2 (two) times daily.  Marland Kitchen HYDROcodone-acetaminophen (NORCO/VICODIN) 5-325 MG tablet Take 1-2 tablets by mouth every 4 (four) hours as needed for moderate pain.  Marland Kitchen ipratropium-albuterol (DUONEB) 0.5-2.5 (3) MG/3ML SOLN Take 3 mLs by nebulization every 4 (four) hours as needed (SOB).   . Multiple Vitamins-Minerals (CENTRUM SILVER ULTRA WOMENS PO) Take 1 tablet by mouth daily.   . naproxen sodium (ALEVE) 220 MG tablet Take 440 mg by mouth 3 (three) times daily as needed (pain).   . polyethylene glycol (MIRALAX / GLYCOLAX) packet Take 17 g by mouth daily.  . promethazine (PHENERGAN) 12.5 MG tablet Take 12.5 mg by mouth every 6 (six) hours as needed for nausea or vomiting.  . senna-docusate (SENOKOT-S) 8.6-50 MG tablet Take 1 tablet by mouth 2 (two) times daily.  Marland Kitchen thiamine 100 MG tablet Take 1 tablet (100 mg total) by mouth daily.  .  vitamin B-12 (CYANOCOBALAMIN) 1000 MCG tablet Take  1,000 mcg by mouth daily.   No facility-administered encounter medications on file as of 03/08/2018.      Review of Systems  Unable to perform ROS: Other (Refuses to Talk)    Vitals:   03/08/18 0952  BP: 117/62  Pulse: 68  Resp: 17  Temp: 97.6 F (36.4 C)  TempSrc: Oral   There is no height or weight on file to calculate BMI. Physical Exam Constitutional:      Comments: But very Anxious. Keeps on Moving in the Bed  HENT:     Head: Normocephalic.     Nose: Nose normal.     Mouth/Throat:     Mouth: Mucous membranes are moist.  Eyes:     Pupils: Pupils are equal, round, and reactive to light.  Neck:     Musculoskeletal: Neck supple.  Cardiovascular:     Rate and Rhythm: Normal rate and regular rhythm.  Pulmonary:     Effort: Pulmonary effort is normal. No respiratory distress.     Breath sounds: Normal breath sounds. No wheezing or rales.  Abdominal:     General: Abdomen is flat. Bowel sounds are normal.     Comments: Some Mid epigastric tenderness  Musculoskeletal: Normal range of motion.        General: No swelling.  Skin:    General: Skin is warm and dry.  Neurological:     Mental Status: She is alert.     Comments: Alert Was moving all extremities   Psychiatric:     Comments: Could not Assess     Labs reviewed: Basic Metabolic Panel: Recent Labs    12/06/17 0457  03/01/18 0601 03/02/18 0553  03/04/18 0657 03/05/18 0522 03/06/18 0629 03/07/18 0745  NA 134*   < > 131* 133*   < > 135 136 137 138  K 4.2   < > 4.3 3.9   < > 4.0 4.4 4.2 3.7  CL 97*   < > 86* 87*   < > 83* 86* 86* 86*  CO2 33*   < > 37* 38*   < > 41* 42* 48* 42*  GLUCOSE 106*   < > 100* 99   < > 105* 100* 103* 129*  BUN 19   < > 13 12   < > 15 25* 23 28*  CREATININE 0.50   < > 0.46 0.34*   < > 0.53 0.38* 0.46 0.52  CALCIUM 9.0   < > 8.0* 7.7*   < > 8.3* 8.4* 8.3* 9.0  MG 1.8  --  2.1 2.0  --  2.2  --   --   --   PHOS 2.6  --  3.7  --    --   --   --   --   --    < > = values in this interval not displayed.   Liver Function Tests: Recent Labs    02/27/18 1545 02/28/18 1529 03/01/18 0601  AST 27 28 27   ALT 20 25 24   ALKPHOS 138* 113 119  BILITOT 0.4 0.5 0.6  PROT 5.8* 5.6* 5.6*  ALBUMIN 3.5 3.2* 3.2*   Recent Labs    01/11/18 1620 02/27/18 1545  LIPASE 18 6.0*   No results for input(s): AMMONIA in the last 8760 hours. CBC: Recent Labs    12/06/17 0457  03/01/18 0601 03/02/18 0553 03/07/18 0745  WBC 9.2   < > 5.6 7.2 6.9  NEUTROABS 5.5  --   --  5.6 5.6  HGB 9.3*   < > 15.1* 14.4 16.1*  HCT 28.4*   < > 49.6* 47.2* 54.0*  MCV 94.0   < > 94.3 94.4 94.6  PLT 118*   < > 229 217 226   < > = values in this interval not displayed.   Cardiac Enzymes: Recent Labs    02/28/18 2147 03/01/18 0244 03/01/18 0601  TROPONINI 0.06* 0.06* 0.06*   BNP: Invalid input(s): POCBNP No results found for: HGBA1C Lab Results  Component Value Date   TSH 1.262 03/01/2018   Lab Results  Component Value Date   VITAMINB12 >1500 (H) 09/20/2017   Lab Results  Component Value Date   FOLATE >24.1 09/20/2017   No results found for: IRON, TIBC, FERRITIN  Imaging and Procedures obtained prior to SNF admission: No results found.  Assessment/Plan Acute on chronic diastolic CHF  Patient on Lasix. No Edema today off her Oxygen also Will continue to monitor Weight  COPD GOLD IV / still somking On Bevespi. Not on Oxygen anymore.  Age-related osteoporosis with multiple Fractures Was recenty Started on Evenity. Will contoinue Pain control with Norco   Tobacco abuse Patient has h/o Smoking at home  Hyponatremia Resolved now  Constipation,  On Miralax  Depression with Anxiety and Failure to thrive Her wellbutrin was stopped in the hospital per Daughters request She is on Buspar BID Failure to thrive with Malnutrioin Have recommended patient to Hospice. She is Refusing therapy, nursing care, refusing  medicines.   Total time spent in this patient care encounter was 45_ minutes; greater than 50% of the visit spent counseling patient, reviewing records , Labs and coordinating care for problems addressed at this encounter.

## 2018-03-08 NOTE — Progress Notes (Signed)
Provider: Veleta Miners MD  Location:    [facility] Room Number: 152/D Place of Service:  SNF (31)  PCP: Midge Minium, MD Patient Care Team: Midge Minium, MD as PCP - General (Family Medicine) Elam Dutch, MD as Consulting Physician (Vascular Surgery) Stark Klein, MD as Consulting Physician (General Surgery) Truitt Merle, MD as Consulting Physician (Hematology) Suella Broad, MD as Consulting Physician (Physical Medicine and Rehabilitation) Valinda Party, MD as Consulting Physician (Rheumatology) Tanda Rockers, MD as Consulting Physician (Pulmonary Disease) Abbie Sons, MD as Referring Physician (Psychiatry)  Extended Emergency Contact Information Primary Emergency Contact: Dillard,Donna Address: [place], [place] [address] [place] of [place] Phone: [phone] Mobile Phone: [phone] Relation: Daughter Secondary Emergency Contact: Dillard,David Address: [address] [place], [place] [address] [place]itter of [place] Phone: [phone] Mobile Phone: [phone] Relation: Relative  Code Status: DNR Goals of Care: Advanced Directive information Advanced Directives 03/08/2018  Does Patient Have a Medical Advance Directive? Yes  Type of Advance Directive Out of facility DNR (pink MOST or yellow form)  Does patient want to make changes to medical advance directive? No - Patient declined  Copy of [place] in Chart? No - copy requested  Would patient like information on creating a medical advance directive? No - Patient declined  Pre-existing out of facility DNR order (yellow form or pink MOST form) -      Chief Complaint  Patient presents with  . New Admit To SNF    New Admission Visit    HPI: Patient is a 72 y.o. female seen today for admission to SNF for therapy.  She stayed in the hospital from 12/11-12/17 for LE edema and New onset of Diastolic  CHF.  Has h/o Osteoporosis with Right Hip Fractures, Compression Fractures in Back and Rib fractures, H/o COPD still Smokes, Hepatocellular Cancer in Remission, PAD,adn Failure to thrive.  Patient unable to give me any history.  Per medical records patient was admitted with worsening lower extremity edema.  Her Dopplers were negative for any DVT.  Her chest x-ray was positive for CHF.  And BNP was elevated.  her echo showed diastolic dysfunction.  She was treated with IV Lasix which was later changed to p.o.  Her CTA was negative for any PE.  He is now in SNF for therapy. Her daughter had told the facility that she is unable to take care of her mom at home.  Patient has been living with her daughter for the past 10 years.  The patient got very upset.  She had refuses to take her medicines.  Refusing to eat.  And refusing nursing care. Patient looks very anxious was mumbling and would not talk to me.  She is also refusing therapy.  Past Medical History:  Diagnosis Date  . Arthritis    DDD. Right shoulder"is frozen"-limited ROM. osteoporosis.  . Blue toe syndrome ([place]) 11/27/2014   Dr. Claudia Pollock evaluating  . Cancer ([place]) 12/2014   liver cancer  . Complication of anesthesia   . COPD (chronic obstructive pulmonary disease) ([place])   . Fibromyalgia   . Fracture of rib of right side    hx "osteoporosis"- states her dog nudge her on the side, next day developed great pain and was told has a fracture rib.  . Macular degeneration    R eye  .  Osteoporosis   . PONV (postoperative nausea and vomiting)    nausea,severe vomiting after 01-13-15 portal vein embolization  . Productive cough   . Retina disorder    L eye, vision distorted, edema  . Spine fracture due to birth trauma   . TMJ disease   . Wears glasses    Past Surgical History:  Procedure Laterality Date  . ANGIOPLASTY  2008   no stents required, no follow-up with cardiologist, no recurrent chest pain  . CATARACT EXTRACTION, BILATERAL  Bilateral   . ESOPHAGOGASTRODUODENOSCOPY (EGD) WITH PROPOFOL N/A 06/25/2015   Procedure: ESOPHAGOGASTRODUODENOSCOPY (EGD) WITH PROPOFOL;  Surgeon: Milus Banister, MD;  Location: [facility];  Service: Endoscopy;  Laterality: N/A;  stent removal   . EYE SURGERY     cornea surgery  . INTRAMEDULLARY (IM) NAIL INTERTROCHANTERIC Right 12/04/2017   Procedure: INTRAMEDULLARY (IM) NAIL INTERTROCHANTRIC;  Surgeon: Rod Can, MD;  Location: [facility];  Service: Orthopedics;  Laterality: Right;  . IR RADIOLOGIST EVAL & MGMT  03/29/2017  . KIDNEY STONE SURGERY    . LAPAROSCOPIC PARTIAL HEPATECTOMY N/A 03/04/2015   Procedure: DIAGNOSTIC LAPAROSCOPY, EXTENDED RIGHT HEPATECTOMY, WITH INTRAOPERATIVE ULTRASOUND;  Surgeon: Stark Klein, MD;  Location: [facility];  Service: General;  Laterality: N/A;  . PARTIAL HYSTERECTOMY    . portal vein embolization     01-13-15 -Dr. Cathlean Sauer.    reports that she has been smoking cigarettes. She has a 22.50 pack-year smoking history. She has never used smokeless tobacco. She reports that she does not drink alcohol or use drugs. Social History   Socioeconomic History  . Marital status: Divorced    Spouse name: Not on file  . Number of children: Not on file  . Years of education: Not on file  . Highest education level: Not on file  Occupational History  . Not on file  Social Needs  . Financial resource strain: Not on file  . Food insecurity:    Worry: Not on file    Inability: Not on file  . Transportation needs:    Medical: Not on file    Non-medical: Not on file  Tobacco Use  . Smoking status: Current Every Day Smoker    Packs/day: 0.50    Years: 45.00    Pack years: 22.50    Types: Cigarettes  . Smokeless tobacco: Never Used  Substance and Sexual Activity  . Alcohol use: No    Alcohol/week: 0.0 standard drinks  . Drug use: No  . Sexual activity: Not on file  Lifestyle  . Physical activity:    Days per week: Not on file    Minutes per session:  Not on file  . Stress: Not on file  Relationships  . Social connections:    Talks on phone: Not on file    Gets together: Not on file    Attends religious service: Not on file    Active member of club or organization: Not on file    Attends meetings of clubs or organizations: Not on file    Relationship status: Not on file  . Intimate partner violence:    Fear of current or ex partner: Not on file    Emotionally abused: Not on file    Physically abused: Not on file    Forced sexual activity: Not on file  Other Topics Concern  . Not on file  Social History Narrative  . Not on file    Functional Status Survey:    Family History  Problem Relation Age of Onset  . Hypertension Mother   . Atrial fibrillation Mother   . Heart disease Mother        after age 63  . Stroke Father   . Dementia Father   . Heart disease Brother        After age 63- A-Fib  . Heart attack Brother     Health Maintenance  Topic Date Due  . MAMMOGRAM  04/07/2018 (Originally 02/10/1995)  . Hepatitis C Screening  10/19/2018 (Originally 05/22/1945)  . PNA vac Low Risk Adult (1 of 2 - PCV13) 10/19/2018 (Originally 02/09/2010)  . INFLUENZA VACCINE  03/02/2020 (Originally 10/19/2017)  . COLONOSCOPY  09/29/2021  . TETANUS/TDAP  09/14/2023  . DEXA SCAN  Completed    Allergies  Allergen Reactions  . Sertraline Hcl     Zoloft, had a terrible reaction  . Amoxicillin-Pot Clavulanate Nausea Only    Has patient had a PCN reaction causing immediate rash, facial/tongue/throat swelling, SOB or lightheadedness with hypotension: No Has patient had a PCN reaction causing severe rash involving mucus membranes or skin necrosis: No Has patient had a PCN reaction that required hospitalization No Has patient had a PCN reaction occurring within the last 10 years: No If all of the above answers are "NO", then may proceed with Cephalosporin use.   . Clindamycin/Lincomycin Other (See Comments)    Felt like it "burned out"  her stomach/took a large dose  . Paroxetine Hcl Rash  . Sulfa Antibiotics Rash    Outpatient Encounter Medications as of 03/08/2018  Medication Sig  . acetaminophen (TYLENOL) 325 MG tablet Take 2 tablets (650 mg total) by mouth every 6 (six) hours as needed for mild pain (or Fever >/= 101).  . bisacodyl (DULCOLAX) 10 MG suppository Place 1 suppository (10 mg total) rectally daily as needed for moderate constipation.  . busPIRone (BUSPAR) 5 MG tablet Take 1 tablet (5 mg total) by mouth 2 (two) times daily.  . Calcium Carbonate-Vitamin D (CALCIUM 500+D HIGH POTENCY PO) Take 1 tablet by mouth daily.   . Cholecalciferol (VITAMIN D3) 5000 units CAPS Take 5,000 Units by mouth daily.  Marland Kitchen EVENITY 105 MG/1.17ML SOSY Inject 210 mg into the skin once. Take once a month on the 4th of the month  . feeding supplement, ENSURE ENLIVE, (ENSURE ENLIVE) LIQD Take 237 mLs by mouth 2 (two) times daily between meals.  . furosemide (LASIX) 40 MG tablet Take 1 tablet (40 mg total) by mouth daily.  . Glycopyrrolate-Formoterol (BEVESPI AEROSPHERE) 9-4.8 MCG/ACT AERO Inhale 2 puffs into the lungs 2 (two) times daily.  Marland Kitchen HYDROcodone-acetaminophen (NORCO/VICODIN) 5-325 MG tablet Take 1-2 tablets by mouth every 4 (four) hours as needed for moderate pain.  Marland Kitchen ipratropium-albuterol (DUONEB) 0.5-2.5 (3) MG/3ML SOLN Take 3 mLs by nebulization every 4 (four) hours as needed (SOB).   . Multiple Vitamins-Minerals (CENTRUM SILVER ULTRA WOMENS PO) Take 1 tablet by mouth daily.   . naproxen sodium (ALEVE) 220 MG tablet Take 440 mg by mouth 3 (three) times daily as needed (pain).   . polyethylene glycol (MIRALAX / GLYCOLAX) packet Take 17 g by mouth daily.  . promethazine (PHENERGAN) 12.5 MG tablet Take 12.5 mg by mouth every 6 (six) hours as needed for nausea or vomiting.  . senna-docusate (SENOKOT-S) 8.6-50 MG tablet Take 1 tablet by mouth 2 (two) times daily.  Marland Kitchen thiamine 100 MG tablet Take 1 tablet (100 mg total) by mouth daily.  .  vitamin B-12 (CYANOCOBALAMIN) 1000 MCG tablet Take  1,000 mcg by mouth daily.   No facility-administered encounter medications on file as of 03/08/2018.      Review of Systems  Unable to perform ROS: Other (Refuses to Talk)    Vitals:   03/08/18 0952  BP: 117/62  Pulse: 68  Resp: 17  Temp: 97.6 F (36.4 C)  TempSrc: Oral   There is no height or weight on file to calculate BMI. Physical Exam Constitutional:      Comments: But very Anxious. Keeps on Moving in the Bed  HENT:     Head: Normocephalic.     Nose: Nose normal.     Mouth/Throat:     Mouth: Mucous membranes are moist.  Eyes:     Pupils: Pupils are equal, round, and reactive to light.  Neck:     Musculoskeletal: Neck supple.  Cardiovascular:     Rate and Rhythm: Normal rate and regular rhythm.  Pulmonary:     Effort: Pulmonary effort is normal. No respiratory distress.     Breath sounds: Normal breath sounds. No wheezing or rales.  Abdominal:     General: Abdomen is flat. Bowel sounds are normal.     Comments: Some Mid epigastric tenderness  Musculoskeletal: Normal range of motion.        General: No swelling.  Skin:    General: Skin is warm and dry.  Neurological:     Mental Status: She is alert.     Comments: Alert Was moving all extremities   Psychiatric:     Comments: Could not Assess     Labs reviewed: Basic Metabolic Panel: Recent Labs    12/06/17 0457  03/01/18 0601 03/02/18 0553  03/04/18 0657 03/05/18 0522 03/06/18 0629 03/07/18 0745  NA 134*   < > 131* 133*   < > 135 136 137 138  K 4.2   < > 4.3 3.9   < > 4.0 4.4 4.2 3.7  CL 97*   < > 86* 87*   < > 83* 86* 86* 86*  CO2 33*   < > 37* 38*   < > 41* 42* 48* 42*  GLUCOSE 106*   < > 100* 99   < > 105* 100* 103* 129*  BUN 19   < > 13 12   < > 15 25* 23 28*  CREATININE 0.50   < > 0.46 0.34*   < > 0.53 0.38* 0.46 0.52  CALCIUM 9.0   < > 8.0* 7.7*   < > 8.3* 8.4* 8.3* 9.0  MG 1.8  --  2.1 2.0  --  2.2  --   --   --   PHOS 2.6  --  3.7  --    --   --   --   --   --    < > = values in this interval not displayed.   Liver Function Tests: Recent Labs    02/27/18 1545 02/28/18 1529 03/01/18 0601  AST 27 28 27   ALT 20 25 24   ALKPHOS 138* 113 119  BILITOT 0.4 0.5 0.6  PROT 5.8* 5.6* 5.6*  ALBUMIN 3.5 3.2* 3.2*   Recent Labs    01/11/18 1620 02/27/18 1545  LIPASE 18 6.0*   No results for input(s): AMMONIA in the last 8760 hours. CBC: Recent Labs    12/06/17 0457  03/01/18 0601 03/02/18 0553 03/07/18 0745  WBC 9.2   < > 5.6 7.2 6.9  NEUTROABS 5.5  --   --  5.6 5.6  HGB 9.3*   < > 15.1* 14.4 16.1*  HCT 28.4*   < > 49.6* 47.2* 54.0*  MCV 94.0   < > 94.3 94.4 94.6  PLT 118*   < > 229 217 226   < > = values in this interval not displayed.   Cardiac Enzymes: Recent Labs    02/28/18 2147 03/01/18 0244 03/01/18 0601  TROPONINI 0.06* 0.06* 0.06*   BNP: Invalid input(s): POCBNP No results found for: HGBA1C Lab Results  Component Value Date   TSH 1.262 03/01/2018   Lab Results  Component Value Date   VITAMINB12 >1500 (H) 09/20/2017   Lab Results  Component Value Date   FOLATE >24.1 09/20/2017   No results found for: IRON, TIBC, FERRITIN  Imaging and Procedures obtained prior to SNF admission: No results found.  Assessment/Plan Acute on chronic diastolic CHF  Patient on Lasix. No Edema today off her Oxygen also Will continue to monitor Weight  COPD GOLD IV / still somking On Bevespi. Not on Oxygen anymore.  Age-related osteoporosis with multiple Fractures Was recenty on Evenity. Will contoinue Pain control with Norco   Tobacco abuse Patient has h/o Smoking at home  Hyponatremia Resolved npow  Constipation,  On Miralax  Depression with Anxiety Her wellbutrin was stoppe din the hospital per Daughters request She is on Buspar BID Failure to thrive with Malnutrioin Have recommended patient to Hospice. She is Refusing therapy, nursing care, refusing medicines.     Family/ staff  Communication:   Labs/tests ordered:

## 2018-03-08 NOTE — Progress Notes (Deleted)
Provider: Veleta Miners MD  Location:    [place] Room Number: 152/D Place of Service:  SNF (31)  PCP: Midge Minium, MD Patient Care Team: Midge Minium, MD as PCP - General (Family Medicine) Elam Dutch, MD as Consulting Physician (Vascular Surgery) Stark Klein, MD as Consulting Physician (General Surgery) Truitt Merle, MD as Consulting Physician (Hematology) Suella Broad, MD as Consulting Physician (Physical Medicine and Rehabilitation) Valinda Party, MD as Consulting Physician (Rheumatology) Tanda Rockers, MD as Consulting Physician (Pulmonary Disease) Abbie Sons, MD as Referring Physician (Psychiatry)  Extended Emergency Contact Information Primary Emergency Contact: Dillard,Donna Address: [place], [place] [address] Johnnette Litter of [place] Phone: [phone] Mobile Phone: [phone] Relation: Daughter Secondary Emergency Contact: Dillard,David Address: [address] [place], [place] [address] Johnnette Litter of [place] Phone: [phone] Mobile Phone: [phone] Relation: Relative  Code Status: DNR Goals of Care: Advanced Directive information Advanced Directives 03/08/2018  Does Patient Have a Medical Advance Directive? Yes  Type of Advance Directive Out of facility DNR (pink MOST or yellow form)  Does patient want to make changes to medical advance directive? No - Patient declined  Copy of [place] in Chart? No - copy requested  Would patient like information on creating a medical advance directive? No - Patient declined  Pre-existing out of facility DNR order (yellow form or pink MOST form) -      Chief Complaint  Patient presents with  . New Admit To SNF    New Admission Visit    HPI: Patient is a 72 y.o. female seen today for admission to  Past Medical History:  Diagnosis Date  . Arthritis    DDD. Right shoulder"is frozen"-limited ROM.  osteoporosis.  . Blue toe syndrome ([place]) 11/27/2014   Dr. Claudia Pollock evaluating  . Cancer ([place]) 12/2014   liver cancer  . Complication of anesthesia   . COPD (chronic obstructive pulmonary disease) ([place])   . Fibromyalgia   . Fracture of rib of right side    hx "osteoporosis"- states her dog nudge her on the side, next day developed great pain and was told has a fracture rib.  . Macular degeneration    R eye  . Osteoporosis   . PONV (postoperative nausea and vomiting)    nausea,severe vomiting after 01-13-15 portal vein embolization  . Productive cough   . Retina disorder    L eye, vision distorted, edema  . Spine fracture due to birth trauma   . TMJ disease   . Wears glasses    Past Surgical History:  Procedure Laterality Date  . ANGIOPLASTY  2008   no stents required, no follow-up with cardiologist, no recurrent chest pain  . CATARACT EXTRACTION, BILATERAL Bilateral   . ESOPHAGOGASTRODUODENOSCOPY (EGD) WITH PROPOFOL N/A 06/25/2015   Procedure: ESOPHAGOGASTRODUODENOSCOPY (EGD) WITH PROPOFOL;  Surgeon: Milus Banister, MD;  Location: [facility];  Service: Endoscopy;  Laterality: N/A;  stent removal   . EYE SURGERY     cornea surgery  . INTRAMEDULLARY (IM) NAIL INTERTROCHANTERIC Right 12/04/2017   Procedure: INTRAMEDULLARY (IM) NAIL INTERTROCHANTRIC;  Surgeon: Rod Can, MD;  Location: [facility];  Service: Orthopedics;  Laterality: Right;  . IR RADIOLOGIST EVAL & MGMT  03/29/2017  . KIDNEY STONE SURGERY    . LAPAROSCOPIC PARTIAL HEPATECTOMY N/A 03/04/2015   Procedure: DIAGNOSTIC LAPAROSCOPY, EXTENDED RIGHT  HEPATECTOMY, WITH INTRAOPERATIVE ULTRASOUND;  Surgeon: Stark Klein, MD;  Location: [facility];  Service: General;  Laterality: N/A;  . PARTIAL HYSTERECTOMY    . portal vein embolization     01-13-15 -Dr. Cathlean Sauer.    reports that she has been smoking cigarettes. She has a 22.50 pack-year smoking history. She has never used smokeless tobacco. She reports that she does  not drink alcohol or use drugs. Social History   Socioeconomic History  . Marital status: Divorced    Spouse name: Not on file  . Number of children: Not on file  . Years of education: Not on file  . Highest education level: Not on file  Occupational History  . Not on file  Social Needs  . Financial resource strain: Not on file  . Food insecurity:    Worry: Not on file    Inability: Not on file  . Transportation needs:    Medical: Not on file    Non-medical: Not on file  Tobacco Use  . Smoking status: Current Every Day Smoker    Packs/day: 0.50    Years: 45.00    Pack years: 22.50    Types: Cigarettes  . Smokeless tobacco: Never Used  Substance and Sexual Activity  . Alcohol use: No    Alcohol/week: 0.0 standard drinks  . Drug use: No  . Sexual activity: Not on file  Lifestyle  . Physical activity:    Days per week: Not on file    Minutes per session: Not on file  . Stress: Not on file  Relationships  . Social connections:    Talks on phone: Not on file    Gets together: Not on file    Attends religious service: Not on file    Active member of club or organization: Not on file    Attends meetings of clubs or organizations: Not on file    Relationship status: Not on file  . Intimate partner violence:    Fear of current or ex partner: Not on file    Emotionally abused: Not on file    Physically abused: Not on file    Forced sexual activity: Not on file  Other Topics Concern  . Not on file  Social History Narrative  . Not on file    Functional Status Survey:    Family History  Problem Relation Age of Onset  . Hypertension Mother   . Atrial fibrillation Mother   . Heart disease Mother        after age 71  . Stroke Father   . Dementia Father   . Heart disease Brother        After age 37- A-Fib  . Heart attack Brother     Health Maintenance  Topic Date Due  . MAMMOGRAM  04/07/2018 (Originally 02/10/1995)  . Hepatitis C Screening  10/19/2018  (Originally 04/29/1945)  . PNA vac Low Risk Adult (1 of 2 - PCV13) 10/19/2018 (Originally 02/09/2010)  . INFLUENZA VACCINE  03/02/2020 (Originally 10/19/2017)  . COLONOSCOPY  09/29/2021  . TETANUS/TDAP  09/14/2023  . DEXA SCAN  Completed    Allergies  Allergen Reactions  . Sertraline Hcl     Zoloft, had a terrible reaction  . Amoxicillin-Pot Clavulanate Nausea Only    Has patient had a PCN reaction causing immediate rash, facial/tongue/throat swelling, SOB or lightheadedness with hypotension: No Has patient had a PCN reaction causing severe rash involving mucus membranes or skin necrosis: No Has patient had a PCN  reaction that required hospitalization No Has patient had a PCN reaction occurring within the last 10 years: No If all of the above answers are "NO", then may proceed with Cephalosporin use.   . Clindamycin/Lincomycin Other (See Comments)    Felt like it "burned out" her stomach/took a large dose  . Paroxetine Hcl Rash  . Sulfa Antibiotics Rash    Outpatient Encounter Medications as of 03/08/2018  Medication Sig  . acetaminophen (TYLENOL) 325 MG tablet Take 2 tablets (650 mg total) by mouth every 6 (six) hours as needed for mild pain (or Fever >/= 101).  . bisacodyl (DULCOLAX) 10 MG suppository Place 1 suppository (10 mg total) rectally daily as needed for moderate constipation.  . busPIRone (BUSPAR) 5 MG tablet Take 1 tablet (5 mg total) by mouth 2 (two) times daily.  . Calcium Carbonate-Vitamin D (CALCIUM 500+D HIGH POTENCY PO) Take 1 tablet by mouth daily.   . Cholecalciferol (VITAMIN D3) 5000 units CAPS Take 5,000 Units by mouth daily.  Marland Kitchen EVENITY 105 MG/1.17ML SOSY Inject 210 mg into the skin once. Take once a month on the 4th of the month  . feeding supplement, ENSURE ENLIVE, (ENSURE ENLIVE) LIQD Take 237 mLs by mouth 2 (two) times daily between meals.  . furosemide (LASIX) 40 MG tablet Take 1 tablet (40 mg total) by mouth daily.  . Glycopyrrolate-Formoterol (BEVESPI  AEROSPHERE) 9-4.8 MCG/ACT AERO Inhale 2 puffs into the lungs 2 (two) times daily.  Marland Kitchen HYDROcodone-acetaminophen (NORCO/VICODIN) 5-325 MG tablet Take 1-2 tablets by mouth every 4 (four) hours as needed for moderate pain.  Marland Kitchen ipratropium-albuterol (DUONEB) 0.5-2.5 (3) MG/3ML SOLN Take 3 mLs by nebulization every 4 (four) hours as needed (SOB).   . Multiple Vitamins-Minerals (CENTRUM SILVER ULTRA WOMENS PO) Take 1 tablet by mouth daily.   . naproxen sodium (ALEVE) 220 MG tablet Take 440 mg by mouth 3 (three) times daily as needed (pain).   . polyethylene glycol (MIRALAX / GLYCOLAX) packet Take 17 g by mouth daily.  . promethazine (PHENERGAN) 12.5 MG tablet Take 12.5 mg by mouth every 6 (six) hours as needed for nausea or vomiting.  . senna-docusate (SENOKOT-S) 8.6-50 MG tablet Take 1 tablet by mouth 2 (two) times daily.  Marland Kitchen thiamine 100 MG tablet Take 1 tablet (100 mg total) by mouth daily.  . vitamin B-12 (CYANOCOBALAMIN) 1000 MCG tablet Take 1,000 mcg by mouth daily.   No facility-administered encounter medications on file as of 03/08/2018.      Review of Systems  Vitals:   03/08/18 0952  BP: 117/62  Pulse: 68  Resp: 17  Temp: 97.6 F (36.4 C)  TempSrc: Oral   There is no height or weight on file to calculate BMI. Physical Exam  Labs reviewed: Basic Metabolic Panel: Recent Labs    12/06/17 0457  03/01/18 0601 03/02/18 0553  03/04/18 0657 03/05/18 0522 03/06/18 0629 03/07/18 0745  NA 134*   < > 131* 133*   < > 135 136 137 138  K 4.2   < > 4.3 3.9   < > 4.0 4.4 4.2 3.7  CL 97*   < > 86* 87*   < > 83* 86* 86* 86*  CO2 33*   < > 37* 38*   < > 41* 42* 48* 42*  GLUCOSE 106*   < > 100* 99   < > 105* 100* 103* 129*  BUN 19   < > 13 12   < > 15 25* 23 28*  CREATININE 0.50   < >  0.46 0.34*   < > 0.53 0.38* 0.46 0.52  CALCIUM 9.0   < > 8.0* 7.7*   < > 8.3* 8.4* 8.3* 9.0  MG 1.8  --  2.1 2.0  --  2.2  --   --   --   PHOS 2.6  --  3.7  --   --   --   --   --   --    < > = values in  this interval not displayed.   Liver Function Tests: Recent Labs    02/27/18 1545 02/28/18 1529 03/01/18 0601  AST 27 28 27   ALT 20 25 24   ALKPHOS 138* 113 119  BILITOT 0.4 0.5 0.6  PROT 5.8* 5.6* 5.6*  ALBUMIN 3.5 3.2* 3.2*   Recent Labs    01/11/18 1620 02/27/18 1545  LIPASE 18 6.0*   No results for input(s): AMMONIA in the last 8760 hours. CBC: Recent Labs    12/06/17 0457  03/01/18 0601 03/02/18 0553 03/07/18 0745  WBC 9.2   < > 5.6 7.2 6.9  NEUTROABS 5.5  --   --  5.6 5.6  HGB 9.3*   < > 15.1* 14.4 16.1*  HCT 28.4*   < > 49.6* 47.2* 54.0*  MCV 94.0   < > 94.3 94.4 94.6  PLT 118*   < > 229 217 226   < > = values in this interval not displayed.   Cardiac Enzymes: Recent Labs    02/28/18 2147 03/01/18 0244 03/01/18 0601  TROPONINI 0.06* 0.06* 0.06*   BNP: Invalid input(s): POCBNP No results found for: HGBA1C Lab Results  Component Value Date   TSH 1.262 03/01/2018   Lab Results  Component Value Date   VITAMINB12 >1500 (H) 09/20/2017   Lab Results  Component Value Date   FOLATE >24.1 09/20/2017   No results found for: IRON, TIBC, FERRITIN  Imaging and Procedures obtained prior to SNF admission: No results found.  Assessment/Plan There are no diagnoses linked to this encounter.   Family/ staff Communication:   Labs/tests ordered:

## 2018-03-09 ENCOUNTER — Emergency Department (HOSPITAL_COMMUNITY): Payer: Medicare Other

## 2018-03-09 ENCOUNTER — Encounter (HOSPITAL_COMMUNITY): Payer: Self-pay | Admitting: Emergency Medicine

## 2018-03-09 ENCOUNTER — Inpatient Hospital Stay
Admission: RE | Admit: 2018-03-09 | Discharge: 2018-03-29 | Disposition: A | Discharge status: Home / Self Care | Payer: Medicare Other | Source: Ambulatory Visit | Attending: Internal Medicine | Admitting: Internal Medicine

## 2018-03-09 ENCOUNTER — Emergency Department (HOSPITAL_COMMUNITY)
Admission: EM | Admit: 2018-03-09 | Discharge: 2018-03-09 | Disposition: A | Discharge status: Home / Self Care | Payer: Medicare Other | Attending: Emergency Medicine | Admitting: Emergency Medicine

## 2018-03-09 ENCOUNTER — Other Ambulatory Visit: Payer: Self-pay

## 2018-03-09 DIAGNOSIS — S79911A Unspecified injury of right hip, initial encounter: Secondary | ICD-10-CM | POA: Diagnosis not present

## 2018-03-09 DIAGNOSIS — M546 Pain in thoracic spine: Secondary | ICD-10-CM | POA: Diagnosis present

## 2018-03-09 DIAGNOSIS — J449 Chronic obstructive pulmonary disease, unspecified: Secondary | ICD-10-CM | POA: Insufficient documentation

## 2018-03-09 DIAGNOSIS — M25551 Pain in right hip: Secondary | ICD-10-CM | POA: Diagnosis not present

## 2018-03-09 DIAGNOSIS — W19XXXA Unspecified fall, initial encounter: Secondary | ICD-10-CM | POA: Diagnosis not present

## 2018-03-09 DIAGNOSIS — F17210 Nicotine dependence, cigarettes, uncomplicated: Secondary | ICD-10-CM | POA: Diagnosis not present

## 2018-03-09 DIAGNOSIS — G8929 Other chronic pain: Secondary | ICD-10-CM | POA: Insufficient documentation

## 2018-03-09 DIAGNOSIS — Z79899 Other long term (current) drug therapy: Secondary | ICD-10-CM | POA: Insufficient documentation

## 2018-03-09 DIAGNOSIS — S79912A Unspecified injury of left hip, initial encounter: Secondary | ICD-10-CM | POA: Diagnosis not present

## 2018-03-09 DIAGNOSIS — I5033 Acute on chronic diastolic (congestive) heart failure: Secondary | ICD-10-CM | POA: Insufficient documentation

## 2018-03-09 DIAGNOSIS — Z955 Presence of coronary angioplasty implant and graft: Secondary | ICD-10-CM | POA: Insufficient documentation

## 2018-03-09 DIAGNOSIS — S0990XA Unspecified injury of head, initial encounter: Secondary | ICD-10-CM | POA: Diagnosis not present

## 2018-03-09 DIAGNOSIS — S22009A Unspecified fracture of unspecified thoracic vertebra, initial encounter for closed fracture: Secondary | ICD-10-CM | POA: Diagnosis not present

## 2018-03-09 DIAGNOSIS — S299XXA Unspecified injury of thorax, initial encounter: Secondary | ICD-10-CM | POA: Diagnosis not present

## 2018-03-09 DIAGNOSIS — M25552 Pain in left hip: Secondary | ICD-10-CM | POA: Diagnosis not present

## 2018-03-09 MED ORDER — HYDROCODONE-ACETAMINOPHEN 5-325 MG PO TABS
1.0000 | ORAL_TABLET | Freq: Once | ORAL | Status: AC
  Administered 2018-03-09: 1 via ORAL
  Filled 2018-03-09: qty 1

## 2018-03-09 NOTE — Discharge Instructions (Addendum)
As we discussed your thoracic spine has a lot of osteoporosis.  No obvious acute injury but is possible there could be some.  In comparison to your old studies it looked about the same.  Rest of work-up for the fall without any significant findings.  Head CT was negative.  Okay to return to the [facility].

## 2018-03-09 NOTE — ED Triage Notes (Signed)
Pt sent from [facility] for a fall and wanting to kill herself.

## 2018-03-09 NOTE — ED Notes (Signed)
Pt changed into paper scrubs, belongings locked in locker with pt sticker on bag

## 2018-03-09 NOTE — ED Notes (Signed)
Daphine Deutscher sitter remains with pt.

## 2018-03-09 NOTE — ED Provider Notes (Signed)
[facility] Provider Note   CSN: [id number] Arrival date & time: 03/09/18  1437     History   Chief Complaint Chief Complaint  Patient presents with  . V70.1  . Fall    HPI Tina Bailey is a 72 y.o. female.  Patient brought in from the [facility] following a fall.  Patient states she did not pass out she just stumbled.  Complaint of some bilateral hip pain states she did hit her head but no loss of consciousness.  Also with complaint of thoracic back pain but she has pain there all the time.  She is not sure if it is any new or worse.  They also reported that she was stating she wanted to kill herself.  She denied any and all of that to me here.  Asked her several times.     Past Medical History:  Diagnosis Date  . Arthritis    DDD. Right shoulder"is frozen"-limited ROM. osteoporosis.  . Blue toe syndrome ([place]) 11/27/2014   Dr. Claudia Pollock evaluating  . Cancer ([place]) 12/2014   liver cancer  . Complication of anesthesia   . COPD (chronic obstructive pulmonary disease) ([place])   . Fibromyalgia   . Fracture of rib of right side    hx "osteoporosis"- states her dog nudge her on the side, next day developed great pain and was told has a fracture rib.  . Macular degeneration    R eye  . Osteoporosis   . PONV (postoperative nausea and vomiting)    nausea,severe vomiting after 01-13-15 portal vein embolization  . Productive cough   . Retina disorder    L eye, vision distorted, edema  . Spine fracture due to birth trauma   . TMJ disease   . Wears glasses     Patient Active Problem List   Diagnosis Date Noted  . Acute on chronic diastolic CHF (congestive heart failure) ([place])   . COPD with acute exacerbation ([place]) 02/28/2018  . Hyponatremia 02/28/2018  . Rash 02/28/2018  . COPD (chronic obstructive pulmonary disease) ([place]) 02/28/2018  . Tobacco abuse 02/28/2018  . Acute respiratory failure with hypoxia ([place]) 02/28/2018  . Acute blood loss anemia  12/06/2017  . Thrombocytopenia ([place]) 12/06/2017  . Severe protein-calorie malnutrition ([place]) 12/05/2017  . Closed comminuted intertrochanteric fracture of proximal end of right femur ([place]) 12/04/2017  . Chronic thoracic back pain 03/31/2017  . Physical exam 01/09/2017  . Malnutrition of moderate degree 12/22/2016  . Anxiety about health 09/27/2016  . Osteoporosis 07/13/2016  . Chronic narcotic use 07/13/2016  . Constipation 04/26/2015  . Chronic pain 04/26/2015  . Depression 04/26/2015  . COPD GOLD IV / still somking 03/03/2015  . Cigarette smoker 03/03/2015  . Hepatocellular carcinoma ([place]) 12/30/2014  . Pancreatic mass 12/04/2014  . Blue toe syndrome ([place]) 11/27/2014    Past Surgical History:  Procedure Laterality Date  . ANGIOPLASTY  2008   no stents required, no follow-up with cardiologist, no recurrent chest pain  . CATARACT EXTRACTION, BILATERAL Bilateral   . ESOPHAGOGASTRODUODENOSCOPY (EGD) WITH PROPOFOL N/A 06/25/2015   Procedure: ESOPHAGOGASTRODUODENOSCOPY (EGD) WITH PROPOFOL;  Surgeon: Milus Banister, MD;  Location: [facility]COPY;  Service: Endoscopy;  Laterality: N/A;  stent removal   . EYE SURGERY     cornea surgery  . INTRAMEDULLARY (IM) NAIL INTERTROCHANTERIC Right 12/04/2017   Procedure: INTRAMEDULLARY (IM) NAIL INTERTROCHANTRIC;  Surgeon: Rod Can, MD;  Location: [facility] OR[facility];  Service: Orthopedics;  Laterality: Right;  . IR RADIOLOGIST EVAL &  MGMT  03/29/2017  . KIDNEY STONE SURGERY    . LAPAROSCOPIC PARTIAL HEPATECTOMY N/A 03/04/2015   Procedure: DIAGNOSTIC LAPAROSCOPY, EXTENDED RIGHT HEPATECTOMY, WITH INTRAOPERATIVE ULTRASOUND;  Surgeon: Stark Klein, MD;  Location: [facility]S;  Service: General;  Laterality: N/A;  . PARTIAL HYSTERECTOMY    . portal vein embolization     01-13-15 -Dr. Cathlean Sauer.     OB History    Gravida  3   Para      Term      Preterm      AB      Living  3     SAB      TAB      Ectopic      Multiple      Live Births                Home Medications    Prior to Admission medications   Medication Sig Start Date End Date Taking? Authorizing Provider  acetaminophen (TYLENOL) 325 MG tablet Take 2 tablets (650 mg total) by mouth every 6 (six) hours as needed for mild pain (or Fever >/= 101). 03/06/18  Yes Florencia Reasons, MD  bisacodyl (DULCOLAX) 10 MG suppository Place 1 suppository (10 mg total) rectally daily as needed for moderate constipation. 12/06/17  Yes Debbe Odea, MD  busPIRone (BUSPAR) 5 MG tablet Take 1 tablet (5 mg total) by mouth 2 (two) times daily. 03/06/18  Yes Florencia Reasons, MD  Calcium Carbonate-Vitamin D (CALCIUM 500+D HIGH POTENCY PO) Take 1 tablet by mouth daily.    Yes [provider]  Cholecalciferol (VITAMIN D3) 5000 units CAPS Take 5,000 Units by mouth daily.   Yes [provider]  EVENITY 105 MG/1.17ML SOSY Inject 210 mg into the skin once. Take once a month on the 4th of the month 12/11/17  Yes [provider]  furosemide (LASIX) 40 MG tablet Take 1 tablet (40 mg total) by mouth daily. 03/06/18  Yes Florencia Reasons, MD  Glycopyrrolate-Formoterol (BEVESPI AEROSPHERE) 9-4.8 MCG/ACT AERO Inhale 2 puffs into the lungs 2 (two) times daily. 05/30/17  Yes Tanda Rockers, MD  HYDROcodone-acetaminophen (NORCO/VICODIN) 5-325 MG tablet Take 1-2 tablets by mouth every 4 (four) hours as needed for moderate pain. 03/07/18  Yes Lassen, Arlo C, PA-C  ipratropium-albuterol (DUONEB) 0.5-2.5 (3) MG/3ML SOLN Take 3 mLs by nebulization every 4 (four) hours as needed (SOB).    Yes [provider]  Multiple Vitamins-Minerals (CENTRUM SILVER ULTRA WOMENS PO) Take 1 tablet by mouth daily.    Yes [provider]  polyethylene glycol (MIRALAX / GLYCOLAX) packet Take 17 g by mouth daily. 12/06/17  Yes Debbe Odea, MD  promethazine (PHENERGAN) 12.5 MG tablet Take 12.5 mg by mouth every 6 (six) hours as needed for nausea or vomiting.   Yes [provider]  senna-docusate  (SENOKOT-S) 8.6-50 MG tablet Take 1 tablet by mouth 2 (two) times daily. 03/06/18  Yes Florencia Reasons, MD  thiamine 100 MG tablet Take 1 tablet (100 mg total) by mouth daily. 03/06/18  Yes Florencia Reasons, MD  vitamin B-12 (CYANOCOBALAMIN) 1000 MCG tablet Take 1,000 mcg by mouth daily.   Yes [provider]  feeding supplement, ENSURE ENLIVE, (ENSURE ENLIVE) LIQD Take 237 mLs by mouth 2 (two) times daily between meals. Patient not taking: Reported on 03/09/2018 12/06/17   Debbe Odea, MD    Family History Family History  Problem Relation Age of Onset  . Hypertension Mother   . Atrial fibrillation Mother   .  Heart disease Mother        after age 82  . Stroke Father   . Dementia Father   . Heart disease Brother        After age 44- A-Fib  . Heart attack Brother     Social History Social History   Tobacco Use  . Smoking status: Current Every Day Smoker    Packs/day: 0.50    Years: 45.00    Pack years: 22.50    Types: Cigarettes  . Smokeless tobacco: Never Used  Substance Use Topics  . Alcohol use: No    Alcohol/week: 0.0 standard drinks  . Drug use: No     Allergies   Sertraline hcl; Amoxicillin-pot clavulanate; Clindamycin/lincomycin; Paroxetine hcl; and Sulfa antibiotics   Review of Systems Review of Systems  Constitutional: Negative for fever.  HENT: Negative for congestion.   Eyes: Negative for redness.  Respiratory: Negative for cough and shortness of breath.   Cardiovascular: Negative for chest pain.  Gastrointestinal: Negative for abdominal pain.  Genitourinary: Negative for dysuria.  Musculoskeletal: Positive for back pain. Negative for neck pain.  Skin: Negative for wound.  Neurological: Negative for headaches.  Hematological: Bruises/bleeds easily.  Psychiatric/Behavioral: Negative for confusion, self-injury and suicidal ideas.     Physical Exam Updated Vital Signs BP 138/64 (BP Location: Right Arm)   Pulse 84   Temp 99.3 F (37.4 C) (Oral)   Resp  14   SpO2 96%   Physical Exam Vitals signs and nursing note reviewed.  Constitutional:      General: She is not in acute distress. HENT:     Head: Normocephalic and atraumatic.     Mouth/Throat:     Mouth: Mucous membranes are moist.  Eyes:     Extraocular Movements: Extraocular movements intact.     Conjunctiva/sclera: Conjunctivae normal.     Pupils: Pupils are equal, round, and reactive to light.  Neck:     Musculoskeletal: Neck supple.  Cardiovascular:     Rate and Rhythm: Normal rate and regular rhythm.     Pulses: Normal pulses.     Heart sounds: Normal heart sounds.  Pulmonary:     Effort: Pulmonary effort is normal. No respiratory distress.     Breath sounds: Normal breath sounds.  Chest:     Chest wall: No tenderness.  Abdominal:     General: Bowel sounds are normal.     Palpations: Abdomen is soft.     Tenderness: There is no abdominal tenderness.  Musculoskeletal:     Comments: Tenderness to palpation along the thoracic spine area but no specific point tenderness.  Mild discomfort with range of motion of the hips.  Bilaterally.  Good cap refill distally.  Skin:    General: Skin is warm.     Capillary Refill: Capillary refill takes less than 2 seconds.  Neurological:     General: No focal deficit present.     Mental Status: She is alert. Mental status is at baseline.     Cranial Nerves: No cranial nerve deficit.     Motor: No weakness.      ED Treatments / Results  Labs (all labs ordered are listed, but only abnormal results are displayed) Labs Reviewed - No data to display  EKG None  Radiology Dg Chest 1 View  Result Date: 03/09/2018 CLINICAL DATA:  Fall EXAM: CHEST  1 VIEW COMPARISON:  CT 03/04/2018, radiograph 02/28/2018 FINDINGS: Hyperinflated lungs without acute opacity or pleural effusion. No pneumothorax. Stable cardiomediastinal silhouette  with tortuous ectatic aorta, history of aneurysmal dilatation of ascending aorta. IMPRESSION: No active  disease. Electronically Signed   By: Donavan Foil M.D.   On: 03/09/2018 17:43   Dg Thoracic Spine 2 View  Result Date: 03/09/2018 CLINICAL DATA:  Fall EXAM: THORACIC SPINE 2 VIEWS COMPARISON:  CT 03/04/2018, radiograph 08/19/2015, 01/11/2018 FINDINGS: Kyphosis of the upper thoracic spine. Diffuse osteopenia. Multiple compression fractures of the thoracic and lumbar spine, grossly similar as compared with chest x-ray 01/11/2018 although osseous demineralization limits the exam. IMPRESSION: 1. Osteopenia with multiple compression fractures and kyphosis of the spine. MRI would be more sensitive for detection of acute marrow edema/acute compression fracture. Electronically Signed   By: Donavan Foil M.D.   On: 03/09/2018 17:46   Ct Head Wo Contrast  Result Date: 03/09/2018 CLINICAL DATA:  Fall.  Head trauma, minor. EXAM: CT HEAD WITHOUT CONTRAST TECHNIQUE: Contiguous axial images were obtained from the base of the skull through the vertex without intravenous contrast. COMPARISON:  None. FINDINGS: Brain: Mild atrophy and white matter disease is present. No acute cortical infarct, hemorrhage, or mass lesion is present. Basal ganglia are intact. No significant extra axial hemorrhage or fluid collection is present. Ventricles are of normal size. Vascular: Atherosclerotic calcifications are present in the cavernous internal carotid arteries. There is no asymmetric hyperdensity. Skull: Calvarium is intact. No significant extracranial soft tissue injury is present. Sinuses/Orbits: The paranasal sinuses and mastoid air cells are clear. Globes and orbits are within normal limits. IMPRESSION: CT head within normal limits for age. Electronically Signed   By: San Morelle M.D.   On: 03/09/2018 17:14   Dg Hips Bilat W Or Wo Pelvis 3-4 Views  Result Date: 03/09/2018 CLINICAL DATA:  Fall EXAM: DG HIP (WITH OR WITHOUT PELVIS) 3-4V BILAT COMPARISON:  12/04/2017, FINDINGS: Postsurgical changes within the mid  abdomen. Pubic symphysis and rami are intact. Intramedullary rod and distal screw fixation of the right femur for prior intertrochanteric fracture with interval bony healing. No acute displaced fracture or malalignment. IMPRESSION: Postsurgical changes of the right femur. No acute osseous abnormality. Electronically Signed   By: Donavan Foil M.D.   On: 03/09/2018 17:48    Procedures Procedures (including critical care time)  Medications Ordered in ED Medications - No data to display   Initial Impression / Assessment and Plan / ED Course  I have reviewed the triage vital signs and the nursing notes.  Pertinent labs & imaging results that were available during my care of the patient were reviewed by me and considered in my medical decision making (see chart for details).     X-rays of the thoracic spine show significant abnormalities but no obvious acute change compared to her previous CT.  CT of her head without any acute injury.  Chest x-ray negative x-rays of both hips and pelvis without any acute injuries.  Once again asked patient to make sure she did not have any suicidal ideation she denied any thoughts of or plans to hurt her self.  Patient stable for return back to [facility].  Patient's thoracic spine certainly could have been acute injury that cannot be seen due to her severe osteoporosis.  They are recommending MRI for further evaluation if it continues to be a problem.  Patient known to have chronic problem there.  Final Clinical Impressions(s) / ED Diagnoses   Final diagnoses:  Fall, initial encounter  Chronic midline thoracic back pain    ED Discharge Orders    None  Fredia Sorrow, MD 03/09/18 1944

## 2018-03-09 NOTE — ED Notes (Signed)
Dr Rogene Houston aware of pt pain level. New orders received.

## 2018-03-12 ENCOUNTER — Non-Acute Institutional Stay (SKILLED_NURSING_FACILITY): Payer: Medicare Other | Admitting: Internal Medicine

## 2018-03-12 ENCOUNTER — Encounter: Payer: Self-pay | Admitting: Internal Medicine

## 2018-03-12 DIAGNOSIS — J449 Chronic obstructive pulmonary disease, unspecified: Secondary | ICD-10-CM | POA: Diagnosis not present

## 2018-03-12 DIAGNOSIS — I5033 Acute on chronic diastolic (congestive) heart failure: Secondary | ICD-10-CM | POA: Diagnosis not present

## 2018-03-12 DIAGNOSIS — E440 Moderate protein-calorie malnutrition: Secondary | ICD-10-CM

## 2018-03-12 DIAGNOSIS — M8000XG Age-related osteoporosis with current pathological fracture, unspecified site, subsequent encounter for fracture with delayed healing: Secondary | ICD-10-CM | POA: Diagnosis not present

## 2018-03-12 NOTE — Progress Notes (Signed)
Location:    [facility] Room Number: 152/D Place of Service:  SNF [phone]) Provider:  Veleta Miners MD  Midge Minium, MD  Patient Care Team: Midge Minium, MD as PCP - General (Family Medicine) Elam Dutch, MD as Consulting Physician (Vascular Surgery) Stark Klein, MD as Consulting Physician (General Surgery) Truitt Merle, MD as Consulting Physician (Hematology) Suella Broad, MD as Consulting Physician (Physical Medicine and Rehabilitation) Valinda Party, MD as Consulting Physician (Rheumatology) Tanda Rockers, MD as Consulting Physician (Pulmonary Disease) Abbie Sons, MD as Referring Physician (Psychiatry)  Extended Emergency Contact Information Primary Emergency Contact: Dillard,Donna Address: [place], [place] [address] Johnnette Litter of [place] Phone: [phone] Mobile Phone: [phone] Relation: Daughter Secondary Emergency Contact: Dillard,David Address: [address] [place], [place] [address] Johnnette Litter of [place] Phone: [phone] Mobile Phone: [phone] Relation: Relative  Code Status:  DNR Goals of care: Advanced Directive information Advanced Directives 03/12/2018  Does Patient Have a Medical Advance Directive? Yes  Type of Advance Directive Out of facility DNR (pink MOST or yellow form)  Does patient want to make changes to medical advance directive? No - Patient declined  Copy of [place] in Chart? -  Would patient like information on creating a medical advance directive? No - Patient declined  Pre-existing out of facility DNR order (yellow form or pink MOST form) -     Chief Complaint  Patient presents with  . Acute Visit    ED F/U     HPI:  Pt is a 72 y.o. female seen today for an acute visit for ED follow up and Anxiety She stayed in the hospital from 12/11-12/17 for LE edema and New onset of Diastolic CHF.and was in SNF for  therapy. Patient has h/o Osteoporosis with Right Hip Fractures, Compression Fractures in Back and Rib fractures, H/o COPD still Smokes, Hepatocellular Cancer in Remission, PAD,and Failure to thrive.  P  Per medical records patient was admitted with worsening lower extremity edema.  Her Dopplers were negative for any DVT.  Her chest x-ray was positive for CHF.  And BNP was elevated. Echo showed diastolic dysfunction.  She was treated with IV Lasix which was later changed to p.o.  Her CTA was negative for any PE.  She is now in SNF for therapy.  Her daughter had told the facility that she is unable to take care of her mom at home.  Patient has been living with her daughter for the past 10 years.  Patient initially refused care and refused to take her Meds or Eat. She was seen and evaluated by Hospice but they don't think she is candidate for Hospice right now. Patient was send for ED yesterday for Behavior issues. Per Nurses she was threatening to kill herself and throwing things at them. She was send baclk to facility. She has been more Calmer today. But she continues to be restless. She told me she is upset as she cannot go home with her daughter. She does not want me to start her on any Antidepressant as she does not like them. She asked for valium. Though now she is eating and taking her Meds Therapy is Cutting her off due to Lack of Participation.     Past Medical History:  Diagnosis Date  . Arthritis    DDD. Right shoulder"is frozen"-limited ROM.  osteoporosis.  . Blue toe syndrome ([place]) 11/27/2014   Dr. Claudia Pollock evaluating  . Cancer ([place]) 12/2014   liver cancer  . Complication of anesthesia   . COPD (chronic obstructive pulmonary disease) ([place])   . Fibromyalgia   . Fracture of rib of right side    hx "osteoporosis"- states her dog nudge her on the side, next day developed great pain and was told has a fracture rib.  . Macular degeneration    R eye  . Osteoporosis   . PONV  (postoperative nausea and vomiting)    nausea,severe vomiting after 01-13-15 portal vein embolization  . Productive cough   . Retina disorder    L eye, vision distorted, edema  . Spine fracture due to birth trauma   . TMJ disease   . Wears glasses    Past Surgical History:  Procedure Laterality Date  . ANGIOPLASTY  2008   no stents required, no follow-up with cardiologist, no recurrent chest pain  . CATARACT EXTRACTION, BILATERAL Bilateral   . ESOPHAGOGASTRODUODENOSCOPY (EGD) WITH PROPOFOL N/A 06/25/2015   Procedure: ESOPHAGOGASTRODUODENOSCOPY (EGD) WITH PROPOFOL;  Surgeon: Milus Banister, MD;  Location: [facility];  Service: Endoscopy;  Laterality: N/A;  stent removal   . EYE SURGERY     cornea surgery  . INTRAMEDULLARY (IM) NAIL INTERTROCHANTERIC Right 12/04/2017   Procedure: INTRAMEDULLARY (IM) NAIL INTERTROCHANTRIC;  Surgeon: Rod Can, MD;  Location: [facility];  Service: Orthopedics;  Laterality: Right;  . IR RADIOLOGIST EVAL & MGMT  03/29/2017  . KIDNEY STONE SURGERY    . LAPAROSCOPIC PARTIAL HEPATECTOMY N/A 03/04/2015   Procedure: DIAGNOSTIC LAPAROSCOPY, EXTENDED RIGHT HEPATECTOMY, WITH INTRAOPERATIVE ULTRASOUND;  Surgeon: Stark Klein, MD;  Location: [facility];  Service: General;  Laterality: N/A;  . PARTIAL HYSTERECTOMY    . portal vein embolization     01-13-15 -Dr. Cathlean Sauer.    Allergies  Allergen Reactions  . Sertraline Hcl     Zoloft, had a terrible reaction  . Amoxicillin-Pot Clavulanate Nausea Only    Has patient had a PCN reaction causing immediate rash, facial/tongue/throat swelling, SOB or lightheadedness with hypotension: No Has patient had a PCN reaction causing severe rash involving mucus membranes or skin necrosis: No Has patient had a PCN reaction that required hospitalization No Has patient had a PCN reaction occurring within the last 10 years: No If all of the above answers are "NO", then may proceed with Cephalosporin use.   .  Clindamycin/Lincomycin Other (See Comments)    Felt like it "burned out" her stomach/took a large dose  . Paroxetine Hcl Rash  . Sulfa Antibiotics Rash    Outpatient Encounter Medications as of 03/12/2018  Medication Sig  . acetaminophen (TYLENOL) 325 MG tablet Take 2 tablets (650 mg total) by mouth every 6 (six) hours as needed for mild pain (or Fever >/= 101).  Roseanne Kaufman [place]-Castor Oil (VENELEX) OINT Apply to bilateral buttocks and sacrum every shift for prevention  . bisacodyl (DULCOLAX) 10 MG suppository Place 1 suppository (10 mg total) rectally daily as needed for moderate constipation.  . busPIRone (BUSPAR) 5 MG tablet Take 1 tablet (5 mg total) by mouth 2 (two) times daily.  . Calcium Carbonate-Vitamin D (CALCIUM 500+D HIGH POTENCY PO) Take 1 tablet by mouth daily.   . Cholecalciferol (VITAMIN D3) 5000 units CAPS Take 5,000 Units by mouth daily.  Marland Kitchen EVENITY 105 MG/1.17ML SOSY Inject 210 mg into the skin once. Take once a month on the 4th of the month  .  feeding supplement, ENSURE ENLIVE, (ENSURE ENLIVE) LIQD Take 237 mLs by mouth 2 (two) times daily between meals.  . furosemide (LASIX) 40 MG tablet Take 1 tablet (40 mg total) by mouth daily.  . Glycopyrrolate-Formoterol (BEVESPI AEROSPHERE) 9-4.8 MCG/ACT AERO Inhale 2 puffs into the lungs 2 (two) times daily.  Marland Kitchen HYDROcodone-acetaminophen (NORCO/VICODIN) 5-325 MG tablet Take 1-2 tablets by mouth every 4 (four) hours as needed for moderate pain.  Marland Kitchen ipratropium-albuterol (DUONEB) 0.5-2.5 (3) MG/3ML SOLN Take 3 mLs by nebulization every 4 (four) hours as needed (SOB).   . Multiple Vitamins-Minerals (CENTRUM SILVER ULTRA WOMENS PO) Take 1 tablet by mouth daily.   . polyethylene glycol (MIRALAX / GLYCOLAX) packet Take 17 g by mouth daily.  . promethazine (PHENERGAN) 12.5 MG tablet Take 12.5 mg by mouth every 6 (six) hours as needed for nausea or vomiting.  . senna-docusate (SENOKOT-S) 8.6-50 MG tablet Take 1 tablet by mouth 2 (two) times  daily.  Marland Kitchen thiamine 100 MG tablet Take 1 tablet (100 mg total) by mouth daily.  . vitamin B-12 (CYANOCOBALAMIN) 1000 MCG tablet Take 1,000 mcg by mouth daily.   No facility-administered encounter medications on file as of 03/12/2018.      Review of Systems  Constitutional: Positive for activity change, appetite change and fatigue.  HENT: Negative.   Respiratory: Negative.   Cardiovascular: Negative.   Gastrointestinal: Negative.   Genitourinary: Negative.   Musculoskeletal: Positive for arthralgias, back pain and myalgias.  Neurological: Negative.   Psychiatric/Behavioral: Positive for behavioral problems, dysphoric mood and sleep disturbance. The patient is nervous/anxious.     Immunization History  Administered Date(s) Administered  . Tdap 09/13/2013   Pertinent  Health Maintenance Due  Topic Date Due  . MAMMOGRAM  04/07/2018 (Originally 02/10/1995)  . PNA vac Low Risk Adult (1 of 2 - PCV13) 10/19/2018 (Originally 02/09/2010)  . INFLUENZA VACCINE  03/02/2020 (Originally 10/19/2017)  . COLONOSCOPY  09/29/2021  . DEXA SCAN  Completed   Fall Risk  10/18/2017 01/09/2017 10/12/2016 09/19/2016 07/13/2016  Falls in the past year? No No No No No   Functional Status Survey:    Vitals:   03/12/18 1113  BP: 126/72  Pulse: 70  Resp: 19  Temp: (!) 97.4 F (36.3 C)  TempSrc: Oral   There is no height or weight on file to calculate BMI. Physical Exam Constitutional:      Appearance: Normal appearance.  HENT:     Head: Normocephalic.     Nose: Nose normal.     Mouth/Throat:     Mouth: Mucous membranes are moist.  Eyes:     Pupils: Pupils are equal, round, and reactive to light.  Neck:     Musculoskeletal: Neck supple.  Cardiovascular:     Rate and Rhythm: Normal rate and regular rhythm.     Pulses: Normal pulses.     Heart sounds: Normal heart sounds. No murmur.  Pulmonary:     Effort: Pulmonary effort is normal.     Breath sounds: Normal breath sounds. No wheezing or  rales.  Abdominal:     General: Bowel sounds are normal. There is no distension.     Palpations: Abdomen is soft.     Tenderness: There is no abdominal tenderness. There is no guarding.  Musculoskeletal:     Comments: Mild Edema Bilateral  Skin:    General: Skin is warm and dry.  Neurological:     General: No focal deficit present.     Mental Status: She is alert  and oriented to person, place, and time.  Psychiatric:        Mood and Affect: Mood is anxious.        Speech: Speech is rapid and pressured.        Behavior: Behavior is aggressive.     Labs reviewed: Recent Labs    12/06/17 0457  03/01/18 0601 03/02/18 0553  03/04/18 0657 03/05/18 0522 03/06/18 0629 03/07/18 0745  NA 134*   < > 131* 133*   < > 135 136 137 138  K 4.2   < > 4.3 3.9   < > 4.0 4.4 4.2 3.7  CL 97*   < > 86* 87*   < > 83* 86* 86* 86*  CO2 33*   < > 37* 38*   < > 41* 42* 48* 42*  GLUCOSE 106*   < > 100* 99   < > 105* 100* 103* 129*  BUN 19   < > 13 12   < > 15 25* 23 28*  CREATININE 0.50   < > 0.46 0.34*   < > 0.53 0.38* 0.46 0.52  CALCIUM 9.0   < > 8.0* 7.7*   < > 8.3* 8.4* 8.3* 9.0  MG 1.8  --  2.1 2.0  --  2.2  --   --   --   PHOS 2.6  --  3.7  --   --   --   --   --   --    < > = values in this interval not displayed.   Recent Labs    02/27/18 1545 02/28/18 1529 03/01/18 0601  AST 27 28 27   ALT 20 25 24   ALKPHOS 138* 113 119  BILITOT 0.4 0.5 0.6  PROT 5.8* 5.6* 5.6*  ALBUMIN 3.5 3.2* 3.2*   Recent Labs    12/06/17 0457  03/01/18 0601 03/02/18 0553 03/07/18 0745  WBC 9.2   < > 5.6 7.2 6.9  NEUTROABS 5.5  --   --  5.6 5.6  HGB 9.3*   < > 15.1* 14.4 16.1*  HCT 28.4*   < > 49.6* 47.2* 54.0*  MCV 94.0   < > 94.3 94.4 94.6  PLT 118*   < > 229 217 226   < > = values in this interval not displayed.   Lab Results  Component Value Date   TSH 1.262 03/01/2018   No results found for: HGBA1C Lab Results  Component Value Date   CHOL 188 01/09/2017   HDL 91.40 01/09/2017   LDLCALC  82 01/09/2017   TRIG 71.0 01/09/2017   CHOLHDL 2 01/09/2017    Significant Diagnostic Results in last 30 days:  Dg Chest 1 View  Result Date: 03/09/2018 CLINICAL DATA:  Fall EXAM: CHEST  1 VIEW COMPARISON:  CT 03/04/2018, radiograph 02/28/2018 FINDINGS: Hyperinflated lungs without acute opacity or pleural effusion. No pneumothorax. Stable cardiomediastinal silhouette with tortuous ectatic aorta, history of aneurysmal dilatation of ascending aorta. IMPRESSION: No active disease. Electronically Signed   By: Donavan Foil M.D.   On: 03/09/2018 17:43   Dg Chest 2 View  Result Date: 02/28/2018 CLINICAL DATA:  COPD and hyponatremia EXAM: CHEST - 2 VIEW COMPARISON:  01/11/2018 FINDINGS: Cardiac shadow is stable. Aortic calcifications are seen as well as tortuosity stable from the previous exam. Small new effusions are seen bilaterally. Mild increased vascular congestion is noted when compare with the prior exam. Old sternal fractures are noted multiple compression fractures are seen in the thoracic spine  stable from the previous exam. No new focal abnormality is seen. IMPRESSION: Changes of mild CHF with vascular congestion and small effusions. Electronically Signed   By: Inez Catalina M.D.   On: 02/28/2018 16:28   Dg Thoracic Spine 2 View  Result Date: 03/09/2018 CLINICAL DATA:  Fall EXAM: THORACIC SPINE 2 VIEWS COMPARISON:  CT 03/04/2018, radiograph 08/19/2015, 01/11/2018 FINDINGS: Kyphosis of the upper thoracic spine. Diffuse osteopenia. Multiple compression fractures of the thoracic and lumbar spine, grossly similar as compared with chest x-ray 01/11/2018 although osseous demineralization limits the exam. IMPRESSION: 1. Osteopenia with multiple compression fractures and kyphosis of the spine. MRI would be more sensitive for detection of acute marrow edema/acute compression fracture. Electronically Signed   By: Donavan Foil M.D.   On: 03/09/2018 17:46   Ct Head Wo Contrast  Result Date:  03/09/2018 CLINICAL DATA:  Fall.  Head trauma, minor. EXAM: CT HEAD WITHOUT CONTRAST TECHNIQUE: Contiguous axial images were obtained from the base of the skull through the vertex without intravenous contrast. COMPARISON:  None. FINDINGS: Brain: Mild atrophy and white matter disease is present. No acute cortical infarct, hemorrhage, or mass lesion is present. Basal ganglia are intact. No significant extra axial hemorrhage or fluid collection is present. Ventricles are of normal size. Vascular: Atherosclerotic calcifications are present in the cavernous internal carotid arteries. There is no asymmetric hyperdensity. Skull: Calvarium is intact. No significant extracranial soft tissue injury is present. Sinuses/Orbits: The paranasal sinuses and mastoid air cells are clear. Globes and orbits are within normal limits. IMPRESSION: CT head within normal limits for age. Electronically Signed   By: San Morelle M.D.   On: 03/09/2018 17:14   Ct Angio Chest Pe W Or Wo Contrast  Result Date: 03/04/2018 CLINICAL DATA:  Acute respiratory failure with hypoxia, chronic diastolic CHF, pulmonary hypertension, BILATERAL lower extremity edema, difficulty ambulating, question pulmonary embolism, interstitial lung disease EXAM: CT ANGIOGRAPHY CHEST WITH CONTRAST TECHNIQUE: Multidetector CT imaging of the chest was performed using the standard protocol during bolus administration of intravenous contrast. Multiplanar CT image reconstructions and MIPs were obtained to evaluate the vascular anatomy. CONTRAST:  16mL ISOVUE-370 IOPAMIDOL (ISOVUE-370) INJECTION 76% IV COMPARISON:  CT chest 12/25/2014 Correlation: Chest radiograph 02/29/2012 FINDINGS: Cardiovascular: Atherosclerotic calcifications aorta. Aneurysmal dilatation ascending thoracic aorta 4.2 cm diameter image 44. No evidence of aortic dissection. Pulmonary arteries well opacified and patent. No evidence of pulmonary embolism. No pericardial effusion.  Mediastinum/Nodes: Esophagus unremarkable. No thoracic adenopathy. Base of cervical region normal appearance. Lungs/Pleura: Small RIGHT and tiny LEFT pleural effusions. Mild central peribronchial thickening. Lungs: Mildly hyperinflated. Minimal subsegmental atelectasis RIGHT upper lobe. Biapical scarring. No acute infiltrate, pleural effusion or pneumothorax. Interstitial changes seen on prior chest radiograph improved. Upper Abdomen: Visualized upper abdomen unremarkable. Musculoskeletal: Diffuse osseous demineralization. Prior sternal fractures. Pronounced thoracic kyphosis with multiple thoracic spine compression fractures at T3-T8, superior endplates of K91-P91, L1, L3. RIGHT glenohumeral degenerative changes. Review of the MIP images confirms the above findings. IMPRESSION: No evidence of pulmonary embolism. BILATERAL pleural effusions small RIGHT and tiny LEFT. Minimal subsegmental atelectasis RIGHT upper lobe. Osseous demineralization with multiple thoracic spine compression fractures and secondary thoracic kyphosis. Prior sternal fractures. Aneurysmal dilatation ascending thoracic aorta 4.2 cm diameter, recommendation below. Recommend annual imaging followup by CTA or MRA. This recommendation follows 2010 ACCF/AHA/AATS/ACR/ASA/SCA/SCAI/SIR/STS/SVM Guidelines for the Diagnosis and Management of Patients with Thoracic Aortic Disease. Circulation. 2010; 121: T056-P794 Aortic Atherosclerosis (ICD10-I70.0). Aortic aneurysm NOS (ICD10-I71.9). Electronically Signed   By: Crist Infante.D.  On: 03/04/2018 15:51   Vas [place] Burnard Bunting With/wo Tbi  Result Date: 02/20/2018 LOWER EXTREMITY DOPPLER STUDY Indications: Peripheral artery disease.  Performing Technologist: Kathrine Comfort RVT, RDCS  Examination Guidelines: A complete evaluation includes at minimum, Doppler waveform signals and systolic blood pressure reading at the level of bilateral brachial, anterior tibial, and posterior tibial arteries, when vessel segments  are accessible. Bilateral testing is considered an integral part of a complete examination. Photoelectric Plethysmograph (PPG) waveforms and toe systolic pressure readings are included as required and additional duplex testing as needed. Limited examinations for reoccurring indications may be performed as noted.  ABI Findings: +---------+------------------+-----+----------+--------+ Right    Rt Pressure (mmHg)IndexWaveform  Comment  +---------+------------------+-----+----------+--------+ Brachial 135                                       +---------+------------------+-----+----------+--------+ PTA      92                0.68 monophasic         +---------+------------------+-----+----------+--------+ DP       92                0.68 monophasic         +---------+------------------+-----+----------+--------+ Great Toe74                0.55 Abnormal           +---------+------------------+-----+----------+--------+ +---------+------------------+-----+----------+-------+ Left     Lt Pressure (mmHg)IndexWaveform  Comment +---------+------------------+-----+----------+-------+ Brachial 135                                      +---------+------------------+-----+----------+-------+ PTA      113               0.84 monophasic        +---------+------------------+-----+----------+-------+ DP       112               0.83 monophasic        +---------+------------------+-----+----------+-------+ Great Toe79                0.59 Abnormal          +---------+------------------+-----+----------+-------+ +-------+-----------+-----------+------------+------------+ ABI/TBIToday's ABIToday's TBIPrevious ABIPrevious TBI +-------+-----------+-----------+------------+------------+ Right  0.68       0.55       0.66        0.28         +-------+-----------+-----------+------------+------------+ Left   0.84       0.59       0.78        0.62          +-------+-----------+-----------+------------+------------+ Right ABIs appear essentially unchanged. Left ABIs appear increased.  Summary: Right: Resting right ankle-brachial index indicates moderate right lower extremity arterial disease. The right toe-brachial index is abnormal. Left: Resting left ankle-brachial index indicates mild left lower extremity arterial disease. The left toe-brachial index is abnormal.  *See table(s) above for measurements and observations.  Electronically signed by Curt Jews MD on 02/20/2018 at 5:08:04 PM.   Final    Dg Hips Bilat W Or Wo Pelvis 3-4 Views  Result Date: 03/09/2018 CLINICAL DATA:  Fall EXAM: DG HIP (WITH OR WITHOUT PELVIS) 3-4V BILAT COMPARISON:  12/04/2017, FINDINGS: Postsurgical changes within the mid abdomen. Pubic symphysis and rami are intact. Intramedullary rod and distal screw fixation of  the right femur for prior intertrochanteric fracture with interval bony healing. No acute displaced fracture or malalignment. IMPRESSION: Postsurgical changes of the right femur. No acute osseous abnormality. Electronically Signed   By: Donavan Foil M.D.   On: 03/09/2018 17:48   Vas [place] Lower Extremity Arterial Duplex  Result Date: 02/20/2018 LOWER EXTREMITY ARTERIAL DUPLEX STUDY Indications: Peripheral artery disease.  Current ABI: RT= 0.68 LT= 0.84 Performing Technologist: Kathrine Comfort RVT, RDCS  Examination Guidelines: A complete evaluation includes B-mode imaging, spectral Doppler, color Doppler, and power Doppler as needed of all accessible portions of each vessel. Bilateral testing is considered an integral part of a complete examination. Limited examinations for reoccurring indications may be performed as noted.  Right Duplex Findings: +-----------+--------+-----+--------+----------+--------+            PSV cm/sRatioStenosisWaveform  Comments +-----------+--------+-----+--------+----------+--------+ CFA Distal 70                   biphasic            +-----------+--------+-----+--------+----------+--------+ DFA        55                   biphasic           +-----------+--------+-----+--------+----------+--------+ SFA Prox   0            occluded                   +-----------+--------+-----+--------+----------+--------+ SFA Mid    0            occluded                   +-----------+--------+-----+--------+----------+--------+ SFA Distal 0            occluded                   +-----------+--------+-----+--------+----------+--------+ POP Prox   40                   monophasic         +-----------+--------+-----+--------+----------+--------+ POP Distal 42                   monophasic         +-----------+--------+-----+--------+----------+--------+ ATA Distal 29                                      +-----------+--------+-----+--------+----------+--------+ PTA Distal 16                   monophasic         +-----------+--------+-----+--------+----------+--------+ PERO Distal25                   monophasic         +-----------+--------+-----+--------+----------+--------+  Summary: Right: Total occlusion noted in the superficial femoral artery with reconstitution in the popliteal artery.  See table(s) above for measurements and observations. Electronically signed by Curt Jews MD on 02/20/2018 at 5:08:23 PM.    Final    Vas [place] Lower Extremity Venous (dvt)  Result Date: 03/01/2018  Lower Venous Study Indications: Edema, and Pain.  Limitations: Body habitus and severe edema,pain and sensitive for compression. Comparison Study: 12/14/2017 venous duplex exam Performing Technologist: Rudell Cobb  Examination Guidelines: A complete evaluation includes B-mode imaging, spectral Doppler, color Doppler, and power Doppler as needed of all accessible portions of each vessel. Bilateral testing is considered  an integral part of a complete examination. Limited examinations for reoccurring indications may be  performed as noted.  Right Venous Findings: +---+---------------+---------+-----------+----------+-------+    CompressibilityPhasicitySpontaneityPropertiesSummary +---+---------------+---------+-----------+----------+-------+ CFVFull           Yes      Yes                          +---+---------------+---------+-----------+----------+-------+  Left Venous Findings: +---------+---------------+---------+-----------+----------+-------+          CompressibilityPhasicitySpontaneityPropertiesSummary +---------+---------------+---------+-----------+----------+-------+ CFV      Full           Yes      Yes                          +---------+---------------+---------+-----------+----------+-------+ SFJ      Full                                                 +---------+---------------+---------+-----------+----------+-------+ FV Prox  Full                                                 +---------+---------------+---------+-----------+----------+-------+ FV Mid   Full                                                 +---------+---------------+---------+-----------+----------+-------+ FV DistalFull                                                 +---------+---------------+---------+-----------+----------+-------+ PFV      Full                                                 +---------+---------------+---------+-----------+----------+-------+ POP      Full           Yes      Yes                          +---------+---------------+---------+-----------+----------+-------+ PTV      Full                                                 +---------+---------------+---------+-----------+----------+-------+ PERO     Full                                                 +---------+---------------+---------+-----------+----------+-------+    Summary: Right: No evidence of common femoral vein obstruction. Left: There is no evidence of deep vein thrombosis  in the lower extremity. However, portions of this examination  were limited- see technologist comments above. No cystic structure found in the popliteal fossa.  *See table(s) above for measurements and observations. Electronically signed by Monica Martinez MD on 03/01/2018 at 3:03:08 PM.    Final     Assessment/Plan Acute on chronic diastolic CHF  Patient on Lasix. No Edema  off her Oxygen also She has gained some weight but it looks more due to her eating better COPD GOLD IV / still somking On Bevespi. Not on Oxygen anymore. Refused for Nicotine patch  Age-related osteoporosis with multiple Fractures Was recenty Started on Evenity. Will contoinue Pain control with Norco Will continue same dose for now   Tobacco abuse Patient has h/o Smoking at home  Hyponatremia Resolved now  Constipation,  On Miralax  Depression with Anxiety and Failure to thrive Her wellbutrin was stopped in the hospital per Daughters request She is on Buspar BID. Continue on 5 Mg BID Patient refused for Cymbalta or Remeron as she is scared of these meds She wants to try Ativan Prn 0.25 Q 8 Hours Psych Consult. Social worker is talking to family about ALF placement    Family/ staff Communication:   Labs/tests ordered:

## 2018-03-13 ENCOUNTER — Other Ambulatory Visit: Payer: Self-pay

## 2018-03-13 ENCOUNTER — Non-Acute Institutional Stay (SKILLED_NURSING_FACILITY): Payer: Medicare Other | Admitting: Internal Medicine

## 2018-03-13 ENCOUNTER — Encounter: Payer: Self-pay | Admitting: Internal Medicine

## 2018-03-13 DIAGNOSIS — M8000XG Age-related osteoporosis with current pathological fracture, unspecified site, subsequent encounter for fracture with delayed healing: Secondary | ICD-10-CM | POA: Diagnosis not present

## 2018-03-13 DIAGNOSIS — J449 Chronic obstructive pulmonary disease, unspecified: Secondary | ICD-10-CM

## 2018-03-13 DIAGNOSIS — I5033 Acute on chronic diastolic (congestive) heart failure: Secondary | ICD-10-CM

## 2018-03-13 DIAGNOSIS — G894 Chronic pain syndrome: Secondary | ICD-10-CM

## 2018-03-13 MED ORDER — HYDROCODONE-ACETAMINOPHEN 5-325 MG PO TABS: 1.0000 | ORAL_TABLET | ORAL | 0 refills | Status: DC | PRN

## 2018-03-13 NOTE — Progress Notes (Signed)
Location:    [facility] Room Number: 152/D Place of Service:  SNF [id number]) Provider: Veleta Miners MD  Midge Minium, MD  Patient Care Team: Midge Minium, MD as PCP - General (Family Medicine) Elam Dutch, MD as Consulting Physician (Vascular Surgery) Stark Klein, MD as Consulting Physician (General Surgery) Truitt Merle, MD as Consulting Physician (Hematology) Suella Broad, MD as Consulting Physician (Physical Medicine and Rehabilitation) Valinda Party, MD as Consulting Physician (Rheumatology) Tanda Rockers, MD as Consulting Physician (Pulmonary Disease) Abbie Sons, MD as Referring Physician (Psychiatry)  Extended Emergency Contact Information Primary Emergency Contact: Dillard,Donna Address: [place], [place] [address] Johnnette Litter of [place] Phone: [phone] Mobile Phone: [phone] Relation: Daughter Secondary Emergency Contact: Dillard,David Address: [address] [place], [place] [address] Johnnette Litter of [place] Phone: [phone] Mobile Phone: [phone] Relation: Relative  Code Status:  DNR Goals of care: Advanced Directive information Advanced Directives 03/13/2018  Does Patient Have a Medical Advance Directive? Yes  Type of Advance Directive Out of facility DNR (pink MOST or yellow form)  Does patient want to make changes to medical advance directive? No - Patient declined  Copy of [place] in Chart? No - copy requested  Would patient like information on creating a medical advance directive? No - Patient declined  Pre-existing out of facility DNR order (yellow form or pink MOST form) -     Chief Complaint  Patient presents with  . Acute Visit    Pain Management Visit    HPI:  Pt is a 72 y.o. female seen today for an acute visit for Management of pain and to D/W the Daughter about further Planning   Patient stayed in the hospital from  12/11-12/17 for LE edema and New onset of Diastolic CHF.and was in SNF for therapy. Patient has h/o Osteoporosis with Right Hip Fractures, Compression Fractures in Back and Rib fractures, H/o COPD still Smokes, Hepatocellular Cancer in Remission, PAD,and Failure to thrive.  P Per medical records patient was admitted with worsening lower extremity edema. Her Dopplers were negative for any DVT. Her chest x-ray was positive for CHF. And BNP was elevated. Echo showed diastolic dysfunction. She was treated with IV Lasix which was later changed to p.o. Her CTA was negative for any PE. She is now in SNF for therapy.  Patient daughter has told the facility that she is unable to take her home.Patient initially refused care and refused to take her Meds or Eat. She was seen and evaluated by Hospice but they don't think she is candidate for Hospice right now. Patient was send for ED few days ago  for Behavior issues. Per Nurses she was threatening to kill herself and throwing things at them. She was send baclk to facility. She has calm down since then. Her daughter wants her pain controlled on Norco 3 times a day and not PRN. She also want ot know if she can be discharged to ALF Patient did not have any new complains today. She still wants to go back with her daughter who is not ready to take her back home.  Past Medical History:  Diagnosis Date  . Arthritis    DDD. Right shoulder"is frozen"-limited ROM. osteoporosis.  . Blue toe syndrome ([facility]) 11/27/2014   Dr. Claudia Pollock evaluating  . Cancer ([facility]) 12/2014   liver cancer  .  Complication of anesthesia   . COPD (chronic obstructive pulmonary disease) ([facility])   . Fibromyalgia   . Fracture of rib of right side    hx "osteoporosis"- states her dog nudge her on the side, next day developed great pain and was told has a fracture rib.  . Macular degeneration    R eye  . Osteoporosis   . PONV (postoperative nausea and vomiting)    nausea,severe vomiting  after 01-13-15 portal vein embolization  . Productive cough   . Retina disorder    L eye, vision distorted, edema  . Spine fracture due to birth trauma   . TMJ disease   . Wears glasses    Past Surgical History:  Procedure Laterality Date  . ANGIOPLASTY  2008   no stents required, no follow-up with cardiologist, no recurrent chest pain  . CATARACT EXTRACTION, BILATERAL Bilateral   . ESOPHAGOGASTRODUODENOSCOPY (EGD) WITH PROPOFOL N/A 06/25/2015   Procedure: ESOPHAGOGASTRODUODENOSCOPY (EGD) WITH PROPOFOL;  Surgeon: Milus Banister, MD;  Location: [facility];  Service: Endoscopy;  Laterality: N/A;  stent removal   . EYE SURGERY     cornea surgery  . INTRAMEDULLARY (IM) NAIL INTERTROCHANTERIC Right 12/04/2017   Procedure: INTRAMEDULLARY (IM) NAIL INTERTROCHANTRIC;  Surgeon: Rod Can, MD;  Location: [facility];  Service: Orthopedics;  Laterality: Right;  . IR RADIOLOGIST EVAL & MGMT  03/29/2017  . KIDNEY STONE SURGERY    . LAPAROSCOPIC PARTIAL HEPATECTOMY N/A 03/04/2015   Procedure: DIAGNOSTIC LAPAROSCOPY, EXTENDED RIGHT HEPATECTOMY, WITH INTRAOPERATIVE ULTRASOUND;  Surgeon: Stark Klein, MD;  Location: [facility];  Service: General;  Laterality: N/A;  . PARTIAL HYSTERECTOMY    . portal vein embolization     01-13-15 -Dr. Cathlean Sauer.    Allergies  Allergen Reactions  . Sertraline Hcl     Zoloft, had a terrible reaction  . Amoxicillin-Pot Clavulanate Nausea Only    Has patient had a PCN reaction causing immediate rash, facial/tongue/throat swelling, SOB or lightheadedness with hypotension: No Has patient had a PCN reaction causing severe rash involving mucus membranes or skin necrosis: No Has patient had a PCN reaction that required hospitalization No Has patient had a PCN reaction occurring within the last 10 years: No If all of the above answers are "NO", then may proceed with Cephalosporin use.   . Clindamycin/Lincomycin Other (See Comments)    Felt like it "burned out" her  stomach/took a large dose  . Paroxetine Hcl Rash  . Sulfa Antibiotics Rash    Outpatient Encounter Medications as of 03/13/2018  Medication Sig  . acetaminophen (TYLENOL) 325 MG tablet Take 2 tablets (650 mg total) by mouth every 6 (six) hours as needed for mild pain (or Fever >/= 101).  Roseanne Kaufman [place]-Castor Oil (VENELEX) OINT Apply to bilateral buttocks and sacrum every shift for prevention  . bisacodyl (DULCOLAX) 10 MG suppository Place 1 suppository (10 mg total) rectally daily as needed for moderate constipation.  . busPIRone (BUSPAR) 5 MG tablet Take 2.5 mg by mouth 2 (two) times daily.  . Calcium Carbonate-Vitamin D (CALCIUM 500+D HIGH POTENCY PO) Take 1 tablet by mouth daily.   . Cholecalciferol (VITAMIN D3) 5000 units CAPS Take 5,000 Units by mouth daily.  Marland Kitchen EVENITY 105 MG/1.17ML SOSY Inject 210 mg into the skin once. Take once a month on the 4th of the month  . feeding supplement, ENSURE ENLIVE, (ENSURE ENLIVE) LIQD Take 237 mLs by mouth 2 (two) times daily between meals.  . furosemide (LASIX) 40 MG tablet Take  1 tablet (40 mg total) by mouth daily.  . Glycopyrrolate-Formoterol (BEVESPI AEROSPHERE) 9-4.8 MCG/ACT AERO Inhale 2 puffs into the lungs 2 (two) times daily.  Marland Kitchen HYDROcodone-acetaminophen (NORCO/VICODIN) 5-325 MG tablet Take 1-2 tablets by mouth every 4 (four) hours as needed for moderate pain.  Marland Kitchen ipratropium-albuterol (DUONEB) 0.5-2.5 (3) MG/3ML SOLN Take 3 mLs by nebulization every 4 (four) hours as needed (SOB).   . LORazepam (ATIVAN) 0.5 MG tablet Take 0.25 mg by mouth every 8 (eight) hours.  . Multiple Vitamins-Minerals (CENTRUM SILVER ULTRA WOMENS PO) Take 1 tablet by mouth daily.   . polyethylene glycol (MIRALAX / GLYCOLAX) packet Take 17 g by mouth daily.  . promethazine (PHENERGAN) 12.5 MG tablet Take 12.5 mg by mouth every 6 (six) hours as needed for nausea or vomiting.  . senna-docusate (SENOKOT-S) 8.6-50 MG tablet Take 1 tablet by mouth 2 (two) times daily.  Marland Kitchen  thiamine 100 MG tablet Take 1 tablet (100 mg total) by mouth daily.  . vitamin B-12 (CYANOCOBALAMIN) 1000 MCG tablet Take 1,000 mcg by mouth daily.  . [DISCONTINUED] busPIRone (BUSPAR) 5 MG tablet Take 1 tablet (5 mg total) by mouth 2 (two) times daily. (Patient taking differently: Take 2.5 mg by mouth 2 (two) times daily. )   No facility-administered encounter medications on file as of 03/13/2018.      Review of Systems  Constitutional: Positive for activity change, appetite change and fatigue.  HENT: Negative.   Respiratory: Negative.   Cardiovascular: Negative.   Gastrointestinal: Negative.   Genitourinary: Negative.   Musculoskeletal: Positive for arthralgias, back pain and myalgias.  Neurological: Negative.   Psychiatric/Behavioral: Positive for behavioral problems, dysphoric mood and sleep disturbance. The patient is nervous/anxious.     Immunization History  Administered Date(s) Administered  . Tdap 09/13/2013   Pertinent  Health Maintenance Due  Topic Date Due  . MAMMOGRAM  04/07/2018 (Originally 02/10/1995)  . PNA vac Low Risk Adult (1 of 2 - PCV13) 10/19/2018 (Originally 02/09/2010)  . INFLUENZA VACCINE  03/02/2020 (Originally 10/19/2017)  . COLONOSCOPY  09/29/2021  . DEXA SCAN  Completed   Fall Risk  10/18/2017 01/09/2017 10/12/2016 09/19/2016 07/13/2016  Falls in the past year? No No No No No   Functional Status Survey:    Vitals:   03/13/18 1029  BP: 126/87  Pulse: (!) 104  Resp: 20  Temp: 98.2 F (36.8 C)  TempSrc: Oral   There is no height or weight on file to calculate BMI. Physical Exam Constitutional:      Appearance: Normal appearance.  HENT:     Head: Normocephalic.     Nose: Nose normal.     Mouth/Throat:     Mouth: Mucous membranes are moist.  Eyes:     Pupils: Pupils are equal, round, and reactive to light.  Neck:     Musculoskeletal: Neck supple.  Cardiovascular:     Rate and Rhythm: Normal rate and regular rhythm.     Pulses: Normal  pulses.     Heart sounds: Normal heart sounds. No murmur.  Pulmonary:     Effort: Pulmonary effort is normal.     Breath sounds: Normal breath sounds. No wheezing or rales.  Abdominal:     General: Bowel sounds are normal. There is no distension.     Palpations: Abdomen is soft.     Tenderness: There is no abdominal tenderness. There is no guarding.  Musculoskeletal:     Comments: Mild Edema Bilateral  Skin:    General: Skin is  warm and dry.  Neurological:     General: No focal deficit present.     Mental Status: She is alert and oriented to person, place, and time.  Psychiatric:        Mood and Affect: Mood is anxious.        Speech: Speech is rapid and pressured.        Behavior: Behavior is aggressive.     Labs reviewed: Recent Labs    12/06/17 0457  03/01/18 0601 03/02/18 0553  03/04/18 0657 03/05/18 0522 03/06/18 0629 03/07/18 0745  NA 134*   < > 131* 133*   < > 135 136 137 138  K 4.2   < > 4.3 3.9   < > 4.0 4.4 4.2 3.7  CL 97*   < > 86* 87*   < > 83* 86* 86* 86*  CO2 33*   < > 37* 38*   < > 41* 42* 48* 42*  GLUCOSE 106*   < > 100* 99   < > 105* 100* 103* 129*  BUN 19   < > 13 12   < > 15 25* 23 28*  CREATININE 0.50   < > 0.46 0.34*   < > 0.53 0.38* 0.46 0.52  CALCIUM 9.0   < > 8.0* 7.7*   < > 8.3* 8.4* 8.3* 9.0  MG 1.8  --  2.1 2.0  --  2.2  --   --   --   PHOS 2.6  --  3.7  --   --   --   --   --   --    < > = values in this interval not displayed.   Recent Labs    02/27/18 1545 02/28/18 1529 03/01/18 0601  AST 27 28 27   ALT 20 25 24   ALKPHOS 138* 113 119  BILITOT 0.4 0.5 0.6  PROT 5.8* 5.6* 5.6*  ALBUMIN 3.5 3.2* 3.2*   Recent Labs    12/06/17 0457  03/01/18 0601 03/02/18 0553 03/07/18 0745  WBC 9.2   < > 5.6 7.2 6.9  NEUTROABS 5.5  --   --  5.6 5.6  HGB 9.3*   < > 15.1* 14.4 16.1*  HCT 28.4*   < > 49.6* 47.2* 54.0*  MCV 94.0   < > 94.3 94.4 94.6  PLT 118*   < > 229 217 226   < > = values in this interval not displayed.   Lab Results    Component Value Date   TSH 1.262 03/01/2018   No results found for: HGBA1C Lab Results  Component Value Date   CHOL 188 01/09/2017   HDL 91.40 01/09/2017   LDLCALC 82 01/09/2017   TRIG 71.0 01/09/2017   CHOLHDL 2 01/09/2017    Significant Diagnostic Results in last 30 days:  Dg Chest 1 View  Result Date: 03/09/2018 CLINICAL DATA:  Fall EXAM: CHEST  1 VIEW COMPARISON:  CT 03/04/2018, radiograph 02/28/2018 FINDINGS: Hyperinflated lungs without acute opacity or pleural effusion. No pneumothorax. Stable cardiomediastinal silhouette with tortuous ectatic aorta, history of aneurysmal dilatation of ascending aorta. IMPRESSION: No active disease. Electronically Signed   By: Donavan Foil M.D.   On: 03/09/2018 17:43   Dg Chest 2 View  Result Date: 02/28/2018 CLINICAL DATA:  COPD and hyponatremia EXAM: CHEST - 2 VIEW COMPARISON:  01/11/2018 FINDINGS: Cardiac shadow is stable. Aortic calcifications are seen as well as tortuosity stable from the previous exam. Small new effusions are seen bilaterally. Mild increased  vascular congestion is noted when compare with the prior exam. Old sternal fractures are noted multiple compression fractures are seen in the thoracic spine stable from the previous exam. No new focal abnormality is seen. IMPRESSION: Changes of mild CHF with vascular congestion and small effusions. Electronically Signed   By: Inez Catalina M.D.   On: 02/28/2018 16:28   Dg Thoracic Spine 2 View  Result Date: 03/09/2018 CLINICAL DATA:  Fall EXAM: THORACIC SPINE 2 VIEWS COMPARISON:  CT 03/04/2018, radiograph 08/19/2015, 01/11/2018 FINDINGS: Kyphosis of the upper thoracic spine. Diffuse osteopenia. Multiple compression fractures of the thoracic and lumbar spine, grossly similar as compared with chest x-ray 01/11/2018 although osseous demineralization limits the exam. IMPRESSION: 1. Osteopenia with multiple compression fractures and kyphosis of the spine. MRI would be more sensitive for  detection of acute marrow edema/acute compression fracture. Electronically Signed   By: Donavan Foil M.D.   On: 03/09/2018 17:46   Ct Head Wo Contrast  Result Date: 03/09/2018 CLINICAL DATA:  Fall.  Head trauma, minor. EXAM: CT HEAD WITHOUT CONTRAST TECHNIQUE: Contiguous axial images were obtained from the base of the skull through the vertex without intravenous contrast. COMPARISON:  None. FINDINGS: Brain: Mild atrophy and white matter disease is present. No acute cortical infarct, hemorrhage, or mass lesion is present. Basal ganglia are intact. No significant extra axial hemorrhage or fluid collection is present. Ventricles are of normal size. Vascular: Atherosclerotic calcifications are present in the cavernous internal carotid arteries. There is no asymmetric hyperdensity. Skull: Calvarium is intact. No significant extracranial soft tissue injury is present. Sinuses/Orbits: The paranasal sinuses and mastoid air cells are clear. Globes and orbits are within normal limits. IMPRESSION: CT head within normal limits for age. Electronically Signed   By: San Morelle M.D.   On: 03/09/2018 17:14   Ct Angio Chest Pe W Or Wo Contrast  Result Date: 03/04/2018 CLINICAL DATA:  Acute respiratory failure with hypoxia, chronic diastolic CHF, pulmonary hypertension, BILATERAL lower extremity edema, difficulty ambulating, question pulmonary embolism, interstitial lung disease EXAM: CT ANGIOGRAPHY CHEST WITH CONTRAST TECHNIQUE: Multidetector CT imaging of the chest was performed using the standard protocol during bolus administration of intravenous contrast. Multiplanar CT image reconstructions and MIPs were obtained to evaluate the vascular anatomy. CONTRAST:  142mL ISOVUE-370 IOPAMIDOL (ISOVUE-370) INJECTION 76% IV COMPARISON:  CT chest 12/25/2014 Correlation: Chest radiograph 02/29/2012 FINDINGS: Cardiovascular: Atherosclerotic calcifications aorta. Aneurysmal dilatation ascending thoracic aorta 4.2 cm  diameter image 44. No evidence of aortic dissection. Pulmonary arteries well opacified and patent. No evidence of pulmonary embolism. No pericardial effusion. Mediastinum/Nodes: Esophagus unremarkable. No thoracic adenopathy. Base of cervical region normal appearance. Lungs/Pleura: Small RIGHT and tiny LEFT pleural effusions. Mild central peribronchial thickening. Lungs: Mildly hyperinflated. Minimal subsegmental atelectasis RIGHT upper lobe. Biapical scarring. No acute infiltrate, pleural effusion or pneumothorax. Interstitial changes seen on prior chest radiograph improved. Upper Abdomen: Visualized upper abdomen unremarkable. Musculoskeletal: Diffuse osseous demineralization. Prior sternal fractures. Pronounced thoracic kyphosis with multiple thoracic spine compression fractures at T3-T8, superior endplates of G25-W38, L1, L3. RIGHT glenohumeral degenerative changes. Review of the MIP images confirms the above findings. IMPRESSION: No evidence of pulmonary embolism. BILATERAL pleural effusions small RIGHT and tiny LEFT. Minimal subsegmental atelectasis RIGHT upper lobe. Osseous demineralization with multiple thoracic spine compression fractures and secondary thoracic kyphosis. Prior sternal fractures. Aneurysmal dilatation ascending thoracic aorta 4.2 cm diameter, recommendation below. Recommend annual imaging followup by CTA or MRA. This recommendation follows 2010 ACCF/AHA/AATS/ACR/ASA/SCA/SCAI/SIR/STS/SVM Guidelines for the Diagnosis and Management of Patients with Thoracic  Aortic Disease. Circulation. 2010; 121: P102-H852 Aortic Atherosclerosis (ICD10-I70.0). Aortic aneurysm NOS (ICD10-I71.9). Electronically Signed   By: Lavonia Dana M.D.   On: 03/04/2018 15:51   Vas [place] Burnard Bunting With/wo Tbi  Result Date: 02/20/2018 LOWER EXTREMITY DOPPLER STUDY Indications: Peripheral artery disease.  Performing Technologist: Kathrine Comfort RVT, RDCS  Examination Guidelines: A complete evaluation includes at minimum, Doppler  waveform signals and systolic blood pressure reading at the level of bilateral brachial, anterior tibial, and posterior tibial arteries, when vessel segments are accessible. Bilateral testing is considered an integral part of a complete examination. Photoelectric Plethysmograph (PPG) waveforms and toe systolic pressure readings are included as required and additional duplex testing as needed. Limited examinations for reoccurring indications may be performed as noted.  ABI Findings: +---------+------------------+-----+----------+--------+ Right    Rt Pressure (mmHg)IndexWaveform  Comment  +---------+------------------+-----+----------+--------+ Brachial 135                                       +---------+------------------+-----+----------+--------+ PTA      92                0.68 monophasic         +---------+------------------+-----+----------+--------+ DP       92                0.68 monophasic         +---------+------------------+-----+----------+--------+ Great Toe74                0.55 Abnormal           +---------+------------------+-----+----------+--------+ +---------+------------------+-----+----------+-------+ Left     Lt Pressure (mmHg)IndexWaveform  Comment +---------+------------------+-----+----------+-------+ Brachial 135                                      +---------+------------------+-----+----------+-------+ PTA      113               0.84 monophasic        +---------+------------------+-----+----------+-------+ DP       112               0.83 monophasic        +---------+------------------+-----+----------+-------+ Great Toe79                0.59 Abnormal          +---------+------------------+-----+----------+-------+ +-------+-----------+-----------+------------+------------+ ABI/TBIToday's ABIToday's TBIPrevious ABIPrevious TBI +-------+-----------+-----------+------------+------------+ Right  0.68       0.55       0.66         0.28         +-------+-----------+-----------+------------+------------+ Left   0.84       0.59       0.78        0.62         +-------+-----------+-----------+------------+------------+ Right ABIs appear essentially unchanged. Left ABIs appear increased.  Summary: Right: Resting right ankle-brachial index indicates moderate right lower extremity arterial disease. The right toe-brachial index is abnormal. Left: Resting left ankle-brachial index indicates mild left lower extremity arterial disease. The left toe-brachial index is abnormal.  *See table(s) above for measurements and observations.  Electronically signed by Curt Jews MD on 02/20/2018 at 5:08:04 PM.   Final    Dg Hips Bilat W Or Wo Pelvis 3-4 Views  Result Date: 03/09/2018 CLINICAL DATA:  Fall EXAM: DG HIP (WITH OR WITHOUT PELVIS) 3-4V  BILAT COMPARISON:  12/04/2017, FINDINGS: Postsurgical changes within the mid abdomen. Pubic symphysis and rami are intact. Intramedullary rod and distal screw fixation of the right femur for prior intertrochanteric fracture with interval bony healing. No acute displaced fracture or malalignment. IMPRESSION: Postsurgical changes of the right femur. No acute osseous abnormality. Electronically Signed   By: Donavan Foil M.D.   On: 03/09/2018 17:48   Vas [place] Lower Extremity Arterial Duplex  Result Date: 02/20/2018 LOWER EXTREMITY ARTERIAL DUPLEX STUDY Indications: Peripheral artery disease.  Current ABI: RT= 0.68 LT= 0.84 Performing Technologist: Kathrine Comfort RVT, RDCS  Examination Guidelines: A complete evaluation includes B-mode imaging, spectral Doppler, color Doppler, and power Doppler as needed of all accessible portions of each vessel. Bilateral testing is considered an integral part of a complete examination. Limited examinations for reoccurring indications may be performed as noted.  Right Duplex Findings: +-----------+--------+-----+--------+----------+--------+            PSV  cm/sRatioStenosisWaveform  Comments +-----------+--------+-----+--------+----------+--------+ CFA Distal 70                   biphasic           +-----------+--------+-----+--------+----------+--------+ DFA        55                   biphasic           +-----------+--------+-----+--------+----------+--------+ SFA Prox   0            occluded                   +-----------+--------+-----+--------+----------+--------+ SFA Mid    0            occluded                   +-----------+--------+-----+--------+----------+--------+ SFA Distal 0            occluded                   +-----------+--------+-----+--------+----------+--------+ POP Prox   40                   monophasic         +-----------+--------+-----+--------+----------+--------+ POP Distal 42                   monophasic         +-----------+--------+-----+--------+----------+--------+ ATA Distal 29                                      +-----------+--------+-----+--------+----------+--------+ PTA Distal 16                   monophasic         +-----------+--------+-----+--------+----------+--------+ PERO Distal25                   monophasic         +-----------+--------+-----+--------+----------+--------+  Summary: Right: Total occlusion noted in the superficial femoral artery with reconstitution in the popliteal artery.  See table(s) above for measurements and observations. Electronically signed by Curt Jews MD on 02/20/2018 at 5:08:23 PM.    Final    Vas [place] Lower Extremity Venous (dvt)  Result Date: 03/01/2018  Lower Venous Study Indications: Edema, and Pain.  Limitations: Body habitus and severe edema,pain and sensitive for compression. Comparison Study: 12/14/2017 venous duplex exam Performing Technologist: Rudell Cobb  Examination Guidelines: A complete  evaluation includes B-mode imaging, spectral Doppler, color Doppler, and power Doppler as needed of all accessible  portions of each vessel. Bilateral testing is considered an integral part of a complete examination. Limited examinations for reoccurring indications may be performed as noted.  Right Venous Findings: +---+---------------+---------+-----------+----------+-------+    CompressibilityPhasicitySpontaneityPropertiesSummary +---+---------------+---------+-----------+----------+-------+ CFVFull           Yes      Yes                          +---+---------------+---------+-----------+----------+-------+  Left Venous Findings: +---------+---------------+---------+-----------+----------+-------+          CompressibilityPhasicitySpontaneityPropertiesSummary +---------+---------------+---------+-----------+----------+-------+ CFV      Full           Yes      Yes                          +---------+---------------+---------+-----------+----------+-------+ SFJ      Full                                                 +---------+---------------+---------+-----------+----------+-------+ FV Prox  Full                                                 +---------+---------------+---------+-----------+----------+-------+ FV Mid   Full                                                 +---------+---------------+---------+-----------+----------+-------+ FV DistalFull                                                 +---------+---------------+---------+-----------+----------+-------+ PFV      Full                                                 +---------+---------------+---------+-----------+----------+-------+ POP      Full           Yes      Yes                          +---------+---------------+---------+-----------+----------+-------+ PTV      Full                                                 +---------+---------------+---------+-----------+----------+-------+ PERO     Full                                                  +---------+---------------+---------+-----------+----------+-------+    Summary: Right: No  evidence of common femoral vein obstruction. Left: There is no evidence of deep vein thrombosis in the lower extremity. However, portions of this examination were limited- see technologist comments above. No cystic structure found in the popliteal fossa.  *See table(s) above for measurements and observations. Electronically signed by Monica Martinez MD on 03/01/2018 at 3:03:08 PM.    Final     Assessment/Plan  Age-related osteoporosis with multiple Fractures Was recentyStartedon Evenity. Will contoinue D/W daughter and Patient  She has been on Chronic Narcotics including Dilaudid before for her Pain management.  She has been followed by Pain Clinic. Will start her on Norco 5/325 mg TID No PRN Norco They have agreed with the plan. Acute on chronic diastolic CHF  Patient on Lasix. No Edema  off her Oxygen also She has gained some weight but it looks more due to her eating better COPD GOLD IV / still somking On Bevespi. Not on Oxygen anymore. Refused for Nicotine patch  Depression with Anxietyand Failure to thrive Her wellbutrin was stoppedin the hospital per Daughters request She is on Buspar BID. Continue on 5 Mg BID Patient refused for Cymbalta or Remeron as she is scared of these meds She wants to try Ativan Prn 0.25 Q 8 Hours Psych Consult pending Social worker is talking to family about ALF placement D/W the Daughter that ALF would be good option for her Mom. Patient keeps refusing to go to any other facility right now. She wants ot go back with her daughter.  Family/ staff Communication:   Labs/tests ordered:   Total time spent in this patient care encounter was 45_ minutes; greater than 50% of the visit spent counseling patient, reviewing records , Labs and coordinating care for problems addressed at this encounter.

## 2018-03-13 NOTE — Telephone Encounter (Signed)
RX Fax for [facility]@ [phone]  

## 2018-03-28 DIAGNOSIS — M810 Age-related osteoporosis without current pathological fracture: Secondary | ICD-10-CM | POA: Diagnosis not present

## 2018-03-29 ENCOUNTER — Encounter: Payer: Self-pay | Admitting: Internal Medicine

## 2018-03-29 ENCOUNTER — Non-Acute Institutional Stay (SKILLED_NURSING_FACILITY): Payer: Medicare Other | Admitting: Internal Medicine

## 2018-03-29 DIAGNOSIS — G894 Chronic pain syndrome: Secondary | ICD-10-CM | POA: Diagnosis not present

## 2018-03-29 DIAGNOSIS — J441 Chronic obstructive pulmonary disease with (acute) exacerbation: Secondary | ICD-10-CM

## 2018-03-29 DIAGNOSIS — R627 Adult failure to thrive: Secondary | ICD-10-CM | POA: Diagnosis not present

## 2018-03-29 DIAGNOSIS — F418 Other specified anxiety disorders: Secondary | ICD-10-CM | POA: Diagnosis not present

## 2018-03-29 DIAGNOSIS — R6251 Failure to thrive (child): Secondary | ICD-10-CM

## 2018-03-29 NOTE — Progress Notes (Signed)
This is a discharge note.  Level of care skilled.  Facility is [facility].  Chief complaint-discharge note history of present illness.  Patient has been here for short-term rehab after hospitalization last month for lower extremity edema and new onset of diastolic CHF.  She was in skilled nursing for therapy.  She also has a history of osteoporosis with right hip fracture pression fractures in her back and rib areas as well as a history of C OPD hepatocellular cancer in remission peripheral arterial disease and failure to thrive.  She was initially admitted the hospital with worsening lower extremity edema Dopplers were negative for any DVT chest x-ray showed CHF BNP was elevated echo showed diastolic dysfunction she was treated with IV Lasix changed to p.o.  Her daughter has told the facility she felt home patient was here initially refused care and refused to take her meds.  Hospice also evaluated her but did not think she was a appropriate candidate.  She was sent to the ED during her stay here for behavior issues threatening to kill her self throwing things at staff- she came back to the facility and is calm down.  Apparently this has improved fairly significantly.  Apparently apparently she will be going back home with her daughter and son-in-law-.  She does have Norco as needed for pain routinely -apparently  tolerated this well-  Past Medical History:  Diagnosis Date  . Arthritis    DDD. Right shoulder"is frozen"-limited ROM. osteoporosis.  . Blue toe syndrome ([place]) 11/27/2014   Dr. Claudia Pollock evaluating  . Cancer ([place]) 12/2014   liver cancer  . Complication of anesthesia   . COPD (chronic obstructive pulmonary disease) ([place])   . Fibromyalgia   . Fracture of rib of right side    hx "osteoporosis"- states her dog nudge her on the side, next day developed great pain and was told has a fracture rib.  . Macular degeneration    R eye  . Osteoporosis   .  PONV (postoperative nausea and vomiting)    nausea,severe vomiting after 01-13-15 portal vein embolization  . Productive cough   . Retina disorder    L eye, vision distorted, edema  . Spine fracture due to birth trauma   . TMJ disease   . Wears glasses         Past Surgical History:  Procedure Laterality Date  . ANGIOPLASTY  2008   no stents required, no follow-up with cardiologist, no recurrent chest pain  . CATARACT EXTRACTION, BILATERAL Bilateral   . ESOPHAGOGASTRODUODENOSCOPY (EGD) WITH PROPOFOL N/A 06/25/2015   Procedure: ESOPHAGOGASTRODUODENOSCOPY (EGD) WITH PROPOFOL;  Surgeon: Milus Banister, MD;  Location: [facility];  Service: Endoscopy;  Laterality: N/A;  stent removal   . EYE SURGERY     cornea surgery  . INTRAMEDULLARY (IM) NAIL INTERTROCHANTERIC Right 12/04/2017   Procedure: INTRAMEDULLARY (IM) NAIL INTERTROCHANTRIC;  Surgeon: Rod Can, MD;  Location: [facility];  Service: Orthopedics;  Laterality: Right;  . IR RADIOLOGIST EVAL & MGMT  03/29/2017  . KIDNEY STONE SURGERY    . LAPAROSCOPIC PARTIAL HEPATECTOMY N/A 03/04/2015   Procedure: DIAGNOSTIC LAPAROSCOPY, EXTENDED RIGHT HEPATECTOMY, WITH INTRAOPERATIVE ULTRASOUND;  Surgeon: Stark Klein, MD;  Location: [facility];  Service: General;  Laterality: N/A;  . PARTIAL HYSTERECTOMY    . portal vein embolization     01-13-15 -Dr. Cathlean Sauer.         Allergies  Allergen Reactions  . Sertraline Hcl     Zoloft, had  a terrible reaction  . Amoxicillin-Pot Clavulanate Nausea Only    Has patient had a PCN reaction causing immediate rash, facial/tongue/throat swelling, SOB or lightheadedness with hypotension: No Has patient had a PCN reaction causing severe rash involving mucus membranes or skin necrosis: No Has patient had a PCN reaction that required hospitalization No Has patient had a PCN reaction occurring within the last 10 years: No If all of the above answers are "NO", then may  proceed with Cephalosporin use.   . Clindamycin/Lincomycin Other (See Comments)    Felt like it "burned out" her stomach/took a large dose  . Paroxetine Hcl Rash  . Sulfa Antibiotics Rash      MEDICATIONS     Medication Sig  . acetaminophen (TYLENOL) 325 MG tablet Take 2 tablets (650 mg total) by mouth every 6 (six) hours as needed for mild pain (or Fever >/= 101).  Roseanne Kaufman [place]-Castor Oil (VENELEX) OINT Apply to bilateral buttocks and sacrum every shift for prevention  . bisacodyl (DULCOLAX) 10 MG suppository Place 1 suppository (10 mg total) rectally daily as needed for moderate constipation.  . busPIRone (BUSPAR) 5 MG tablet Take 2.5 mg by mouth 2 (two) times daily.  . Calcium Carbonate-Vitamin D (CALCIUM 500+D HIGH POTENCY PO) Take 1 tablet by mouth daily.   . Cholecalciferol (VITAMIN D3) 5000 units CAPS Take 5,000 Units by mouth daily.  Marland Kitchen EVENITY 105 MG/1.17ML SOSY Inject 210 mg into the skin once. Take once a month on the 4th of the month  . feeding supplement, ENSURE ENLIVE, (ENSURE ENLIVE) LIQD Take 237 mLs by mouth 2 (two) times daily between meals.  . furosemide (LASIX) 40 MG tablet Take 1 tablet (40 mg total) by mouth daily.  . Glycopyrrolate-Formoterol (BEVESPI AEROSPHERE) 9-4.8 MCG/ACT AERO Inhale 2 puffs into the lungs 2 (two) times daily.  Marland Kitchen HYDROcodone-acetaminophen (NORCO/VICODIN) 5-325 MG tablet  take 1 tablet 3 times a day hold for sedation or respiratory depression  . ipratropium-albuterol (DUONEB) 0.5-2.5 (3) MG/3ML SOLN Take 3 mLs by nebulization every 4 (four) hours as needed (SOB).   . LORazepam (ATIVAN) 0.5 MG tablet Take 0.25 mg by mouth every 8 (eight) hours.  . Multiple Vitamins-Minerals (CENTRUM SILVER ULTRA WOMENS PO) Take 1 tablet by mouth daily.   . polyethylene glycol (MIRALAX / GLYCOLAX) packet Take 17 g by mouth daily.  . promethazine (PHENERGAN) 12.5 MG tablet Take 12.5 mg by mouth every 6 (six) hours as needed for nausea or vomiting.  .  senna-docusate (SENOKOT-S) 8.6-50 MG tablet Take 1 tablet by mouth 2 (two) times daily.  Marland Kitchen thiamine 100 MG tablet Take 1 tablet (100 mg total) by mouth daily.  . vitamin B-12 (CYANOCOBALAMIN) 1000 MCG tablet Take 1,000 mcg by mouth daily.  . [DISCONTINUED] busPIRone (BUSPAR) 5 MG tablet Take 1 tablet (5 mg total) by mouth 2 (two) times daily. (Patient taking differently: Take 2.5 mg by mouth 2 (two) times daily. )        Review of systems.  In general she is not complaining of fever chills appears to be doing better- she has gained it appears about 7 to 8 pounds since her admission.  Skin does not complain of rashes or itching.  Head ears eyes nose mouth and throat not complain of visual changes or sore throat.  Respiratory does not complain of being short of breath. Or having cough.  Says she has quit smoking for the time being but does not want a patch.  Cardiac does not complain  of chest pain or significant lower extremity edema I would say it is trace.  GI does not complain of abdominal pain nausea vomiting diarrhea or constipation.  Musculoskeletal has frailty but says her pain is controlled current medications in fact per discussion with patient and daughter will make the Norco as needed instead of routine.  Neurologic is not complaint dizziness headache or numbness  Psych appears to be less anxious and aggressive certainly less so than when she arrived in facility she actually appears to have fitin fairly well    Physical exam.  She is afebrile pulse of 84 respirations of 18 blood pressure taken manually 112/70 this appears relatively baseline weight is 88 pounds.  General this is a pleasant somewhat frail elderly female in no distress sitting comfortably in a wheelchair.  Her skin is warm and dry.  Eyes visual acuity appears to be intact sclera and conjunctive are clear.  Oropharynx is clear mucous membranes moist.  Chest is clear to auscultation there is no  labored breathing air entry is somewhat shallow.  Heart is regular rate and rhythm without murmur gallop or rub she has fairly minimal lower extremity edema.  Abdomen is soft nontender with positive bowel sounds.  Musculoskeletal is able to move all extremities x4 and ambulate in wheelchair appears to do fairly well with this she will need continued strengthening at home with therapy.  Neurologic she is alert pleasant cooperative cannot really appreciate any lateralizing findings.  Psych she is pleasant and appropriate follows commands without difficulty says she actually likes the food here  .  Labs.  March 08, 2019.  WBC 6.9 hemoglobin 16.1 platelets 226.  Sodium 138 potassium 3.7 BUN 28 creatinine 0.52.      Marland Kitchen  Assessment and plan.  History of multiple fractures with age-related osteoporosis- has recently started Evenity.  Pain management recently was discussed with patient's daughter by Dr. Lyndel Safe- patient has been on chronic narcotics including Dilaudid- and has been followed by the pain clinic.  She has been started on Norco 5-325 mg 3 times a day but no further PRN doses- patient family apparently had agreed to this- but per discussion with patient and daughter today they are agreeable to making the Norco PRN with no routine doses  Will need continued PT and OT as well as nursing support for her issues.    2 acute on chronic diastolic CHF she is on Lasix- she is appears to have done well any weight gain was thought to be more appetite related---.  Edema is fairly minimal respiratory status appears stable  3.-  History of COPD- continues on BevespiShe has refused a nicotine patch and continues to smoke.  At this point appears stabilized I did advise her that the honeymoon she has had from smoking is often a good chance to try to quit smoking entirely  -4-failure to thrive with depression and anxiety-her Wellbutrin was stopped in the hospital per daughter's request-  she is on twice daily BuSpar at 0.25 mg twice daily- as well as Ativan 0.25 mg 3 times daily as needed.  Apparently behaviors have improved   Again she will be going home with her family her daughter who is very supportive and actually in the room today- will write for 15 tablets of hydrocodone with no refills there will be follow-up with primary care provider early next week.  She will need PT and OT and nursing support.  Also will have labs updated by home health including a CBC  and metabolic pane--primary care provider notified of results --l she did have some history of hyponatremia before hospitalization but this was thought to be volume overload which I do not see evidence of now but would like to ensure stability here   [id number]--of note  greater than 30 minutes spent on this discharge summary-greater than 50% of time spent coordinating a plan of care for numerous diagnoses

## 2018-04-01 DIAGNOSIS — M8088XD Other osteoporosis with current pathological fracture, vertebra(e), subsequent encounter for fracture with routine healing: Secondary | ICD-10-CM | POA: Diagnosis not present

## 2018-04-01 DIAGNOSIS — Z720 Tobacco use: Secondary | ICD-10-CM | POA: Diagnosis not present

## 2018-04-01 DIAGNOSIS — L309 Dermatitis, unspecified: Secondary | ICD-10-CM | POA: Diagnosis not present

## 2018-04-01 DIAGNOSIS — E46 Unspecified protein-calorie malnutrition: Secondary | ICD-10-CM | POA: Diagnosis not present

## 2018-04-01 DIAGNOSIS — J449 Chronic obstructive pulmonary disease, unspecified: Secondary | ICD-10-CM | POA: Diagnosis not present

## 2018-04-01 DIAGNOSIS — I110 Hypertensive heart disease with heart failure: Secondary | ICD-10-CM | POA: Diagnosis not present

## 2018-04-01 DIAGNOSIS — I272 Pulmonary hypertension, unspecified: Secondary | ICD-10-CM | POA: Diagnosis not present

## 2018-04-01 DIAGNOSIS — Z96649 Presence of unspecified artificial hip joint: Secondary | ICD-10-CM | POA: Diagnosis not present

## 2018-04-02 ENCOUNTER — Telehealth: Payer: Self-pay | Admitting: Family Medicine

## 2018-04-02 ENCOUNTER — Telehealth: Payer: Self-pay | Admitting: General Practice

## 2018-04-02 ENCOUNTER — Ambulatory Visit (INDEPENDENT_AMBULATORY_CARE_PROVIDER_SITE_OTHER): Payer: Medicare Other | Admitting: Family Medicine

## 2018-04-02 ENCOUNTER — Encounter: Payer: Self-pay | Admitting: Family Medicine

## 2018-04-02 ENCOUNTER — Other Ambulatory Visit: Payer: Self-pay

## 2018-04-02 VITALS — BP 101/72 | HR 82 | Temp 98.2°F | Resp 17 | Ht 63.0 in | Wt 91.5 lb

## 2018-04-02 DIAGNOSIS — D696 Thrombocytopenia, unspecified: Secondary | ICD-10-CM

## 2018-04-02 DIAGNOSIS — I5033 Acute on chronic diastolic (congestive) heart failure: Secondary | ICD-10-CM | POA: Diagnosis not present

## 2018-04-02 DIAGNOSIS — C220 Liver cell carcinoma: Secondary | ICD-10-CM

## 2018-04-02 DIAGNOSIS — M810 Age-related osteoporosis without current pathological fracture: Secondary | ICD-10-CM | POA: Diagnosis not present

## 2018-04-02 DIAGNOSIS — F1190 Opioid use, unspecified, uncomplicated: Secondary | ICD-10-CM | POA: Diagnosis not present

## 2018-04-02 DIAGNOSIS — E46 Unspecified protein-calorie malnutrition: Secondary | ICD-10-CM | POA: Diagnosis not present

## 2018-04-02 DIAGNOSIS — J438 Other emphysema: Secondary | ICD-10-CM

## 2018-04-02 DIAGNOSIS — F332 Major depressive disorder, recurrent severe without psychotic features: Secondary | ICD-10-CM

## 2018-04-02 DIAGNOSIS — I110 Hypertensive heart disease with heart failure: Secondary | ICD-10-CM | POA: Diagnosis not present

## 2018-04-02 DIAGNOSIS — E43 Unspecified severe protein-calorie malnutrition: Secondary | ICD-10-CM

## 2018-04-02 DIAGNOSIS — Z111 Encounter for screening for respiratory tuberculosis: Secondary | ICD-10-CM

## 2018-04-02 LAB — CBC WITH DIFFERENTIAL/PLATELET
Basophils Absolute: 0 10*3/uL (ref 0.0–0.1)
Basophils Relative: 0.7 % (ref 0.0–3.0)
Eosinophils Absolute: 0 10*3/uL (ref 0.0–0.7)
Eosinophils Relative: 0.5 % (ref 0.0–5.0)
HCT: 42.7 % (ref 36.0–46.0)
Hemoglobin: 13.9 g/dL (ref 12.0–15.0)
Lymphocytes Relative: 32.8 % (ref 12.0–46.0)
Lymphs Abs: 1.3 10*3/uL (ref 0.7–4.0)
MCHC: 32.5 g/dL (ref 30.0–36.0)
MCV: 90.5 fl (ref 78.0–100.0)
Monocytes Absolute: 0.7 10*3/uL (ref 0.1–1.0)
Monocytes Relative: 16.1 % — ABNORMAL HIGH (ref 3.0–12.0)
Neutro Abs: 2 10*3/uL (ref 1.4–7.7)
Neutrophils Relative %: 49.9 % (ref 43.0–77.0)
Platelets: 184 10*3/uL (ref 150.0–400.0)
RBC: 4.73 Mil/uL (ref 3.87–5.11)
RDW: 17.4 % — ABNORMAL HIGH (ref 11.5–15.5)
WBC: 4.1 10*3/uL (ref 4.0–10.5)

## 2018-04-02 LAB — BASIC METABOLIC PANEL
BUN: 14 mg/dL (ref 6–23)
CO2: 36 mEq/L — ABNORMAL HIGH (ref 19–32)
Calcium: 8.6 mg/dL (ref 8.4–10.5)
Chloride: 96 mEq/L (ref 96–112)
Creatinine, Ser: 0.53 mg/dL (ref 0.40–1.20)
GFR: 120.18 mL/min (ref >60.00)
Glucose, Bld: 63 mg/dL — ABNORMAL LOW (ref 70–99)
Potassium: 4.4 mEq/L (ref 3.5–5.1)
Sodium: 136 mEq/L (ref 135–145)

## 2018-04-02 MED ORDER — HYDROCODONE-ACETAMINOPHEN 5-325 MG PO TABS: 1.0000 | ORAL_TABLET | Freq: Three times a day (TID) | ORAL | 0 refills | Status: DC

## 2018-04-02 MED ORDER — ALPRAZOLAM 0.25 MG PO TABS: 0.2500 mg | ORAL_TABLET | Freq: Two times a day (BID) | ORAL | 1 refills | Status: DC | PRN

## 2018-04-02 MED ORDER — SPIRONOLACTONE 50 MG PO TABS: 50.0000 mg | ORAL_TABLET | Freq: Every day | ORAL | 3 refills | Status: DC

## 2018-04-02 MED ORDER — BUSPIRONE HCL 5 MG PO TABS: 2.5000 mg | ORAL_TABLET | Freq: Two times a day (BID) | ORAL | 3 refills | Status: DC

## 2018-04-02 NOTE — Telephone Encounter (Signed)
Pt is taking medication appropriately and I don't understand the issue with the pharmacy.  Her original script was only 15 pills.  I refilled today as they are almost out.  Please call pharmacy and see what the issue is

## 2018-04-02 NOTE — Telephone Encounter (Signed)
Please advise?   Copied from [place] [phone]. Topic: General - Other >> Apr 02, 2018  1:17 PM Leward Quan A wrote: Reason for CRM: Patient daughter [name] called to sy that she is having a hard time getting refill on HYDROcodone-acetaminophen (NORCO/VICODIN) 5-325 MG tablet. Daughter states that [facility] say its too early for a refill per daughter it was last filled 03/29/2018 15 tabs patient has 2 tabs left. She states that it is taken every 8 hrs daily asking for help with the pharmacy. Ph# [phone]

## 2018-04-02 NOTE — Telephone Encounter (Signed)
Ok to proceed. 

## 2018-04-02 NOTE — Telephone Encounter (Signed)
Called and spoke with pt pharmacy. They advised that the hydrocodone is available for pick up. They were also advised to D/c lorazepam and fill alprazolam for pt. Advised that would be ready as well today.

## 2018-04-02 NOTE — Patient Instructions (Addendum)
Follow up in 1-2 weeks to recheck swelling and potassium We'll notify you of your lab results and make any changes if needed (including TB test) STOP the Lasix START the Spironolactone once daily for swelling- we may need to increase this in the future but I wanted to start low USE the Hydrocodone as needed for pain USE the Alprazolam as needed for high anxiety STOP the Lorazepam CONTINUE the Buspar twice daily I will complete the FL2 and FMLA paperwork I'm SO proud of you for quitting smoking!!! No other med changes at this time Call with any questions or concerns Hang in there!!

## 2018-04-02 NOTE — Progress Notes (Signed)
   Subjective:    Patient ID: Tina Bailey, female    DOB: 31-Dec-1945, 73 y.o.   MRN: [id number]  [facility] f/u- pt was admitted to hospital 82/95-62 w/ diastolic CHF exacerbation.  She was treated w/ IV lasix, transitioned to PO lasix and dc'd to SNF.  She was d/c'd from SNF on 03/29/18.  While in the hospital, pt was weaned off Wellbutrin and started on Buspar.  Mood is currently 'so so'.  While she was in SNF, she had behavioral issues that included throwing things at staff and threatening to kill herself.  By d/c, these had resolved.  She has started Evenity for multiple osteoporotic fxs (Dr Dossie Der) and her pain meds have transitioned to PRN rather than scheduled.  Per SNF d/c summary, they are requesting CBC/BMP to f/u hyponatremia and electrolytes on Lasix.  She needs HH PT/OT- these orders were already signed.  Family is looking at [facility] for ALF.  Pt needs repeat TB test, declines flu.  Pt stopped lasix due to itching (she is allergic to Sulfa) but took a pill this AM due to recurrent swelling.  No SOB.  Pt has not smoked since December.  Reviewed hospital D/C summary, imaging, labs, SNF d/c summary.  meds reconciled.   Review of Systems For ROS see HPI     Objective:   Physical Exam Constitutional:      Appearance: Normal appearance.     Comments: Frail, almost cachectic appearing  HENT:     Head: Normocephalic and atraumatic.  Cardiovascular:     Rate and Rhythm: Normal rate and regular rhythm.     Pulses: Normal pulses.     Heart sounds: No murmur.  Pulmonary:     Effort: Pulmonary effort is normal. No respiratory distress.     Breath sounds: Normal breath sounds. No wheezing or rhonchi.  Abdominal:     General: Abdomen is flat. Bowel sounds are normal. There is no distension.     Tenderness: There is no abdominal tenderness. There is no guarding.  Skin:    General: Skin is warm and dry.  Neurological:     General: No focal deficit present.     Mental  Status: She is alert and oriented to person, place, and time.           Assessment & Plan:

## 2018-04-02 NOTE — Telephone Encounter (Signed)
Okay for verbal orders. 

## 2018-04-02 NOTE — Telephone Encounter (Signed)
Verbal ok given for nursing.

## 2018-04-02 NOTE — Telephone Encounter (Signed)
Copied from [place] [phone]. Topic: Quick Communication - See Telephone Encounter >> Apr 02, 2018  8:58 AM Burchel, Abbi R wrote: CRM for notification. See Telephone encounter for: 04/02/18.  [facility] care [phone]) requesting v/o for nursing 1x3wk  Please note: pt reports that she has stopped taking furosemide due to itching.   *ok to leave vm*

## 2018-04-03 ENCOUNTER — Telehealth: Payer: Self-pay | Admitting: General Practice

## 2018-04-03 ENCOUNTER — Encounter: Payer: Self-pay | Admitting: General Practice

## 2018-04-03 NOTE — Telephone Encounter (Signed)
These forms have been completed and were placed in basket for front desk. We do not have TB test back yet. If we want to hold off on sending them. Or TB can be sent at later time.    Copied from [place] [phone]. Topic: General - Other >> Apr 03, 2018  1:48 PM Alfredia Ferguson R wrote: Patients daughter called in and stated she will need FL-2 forms by Thursday and she can come into office to pick up or they can be faxed to facility Fax # [phone] >> Apr 03, 2018  1:50 PM Alfredia Ferguson R wrote: TB results to be faxed also

## 2018-04-04 ENCOUNTER — Telehealth: Payer: Self-pay

## 2018-04-04 DIAGNOSIS — E46 Unspecified protein-calorie malnutrition: Secondary | ICD-10-CM | POA: Diagnosis not present

## 2018-04-04 DIAGNOSIS — I110 Hypertensive heart disease with heart failure: Secondary | ICD-10-CM | POA: Diagnosis not present

## 2018-04-04 LAB — QUANTIFERON-TB GOLD PLUS
Mitogen-NIL: 7.96 IU/mL
NIL: 0.04 IU/mL
QuantiFERON-TB Gold Plus: NEGATIVE
TB1-NIL: 0 IU/mL
TB2-NIL: 0 IU/mL

## 2018-04-04 NOTE — Assessment & Plan Note (Signed)
New.  Pt was effectively diuresed as an inpt and was switched to PO Lasix but this causes itching due to her sulfa allergy.  Will switch to Spironolactone and monitor volume status closely.  Pt expressed understanding and is in agreement w/ plan.

## 2018-04-04 NOTE — Telephone Encounter (Signed)
Ok to proceed w/ OT

## 2018-04-04 NOTE — Telephone Encounter (Signed)
Ok for OT

## 2018-04-04 NOTE — Assessment & Plan Note (Signed)
Chronic problem.  Pt has had multiple fractures due to severity of disease.  Is now on Envenity injxns w/ Rheumatology.

## 2018-04-04 NOTE — Telephone Encounter (Signed)
Called and verbal ok given.  

## 2018-04-04 NOTE — Assessment & Plan Note (Signed)
Ongoing issue for pt.  Stressed need for dietary supplements like Ensure or Boost.  Will follow.

## 2018-04-04 NOTE — Assessment & Plan Note (Signed)
Pt was weaned off Oxycodone and is now on Hydrocodone TID for chronic pain management.  Needs refill today but since Dr Nelva Bush has been treating her, encouraged daughter to call and schedule appt to update him to all that has happened.  Daughter is agreeable to this.

## 2018-04-04 NOTE — Telephone Encounter (Signed)
I have picked up the forms and faxed to [facility] and the FMLA forms.

## 2018-04-04 NOTE — Assessment & Plan Note (Signed)
Following w/ Dr Burr Medico.  Currently in remission.

## 2018-04-04 NOTE — Telephone Encounter (Signed)
Pt's daughter calling back. States FL2 needs to go ahead and be faxed to [phone], Attn:  Jamey Reas at [facility].  When TB test comes back that will need to be faxed to the same place, same number and Attn.  Pt's daughter would like FL2 faxed today.  Pt's daughter states that she also left FLMA for herself and she wants to know if that is ready to be picked up as well.  [name] can be reached at [phone].

## 2018-04-04 NOTE — Assessment & Plan Note (Signed)
Chronic problem.  Pt has recently stopped smoking due to her prolonged hospitalization.  Pt reports already feeling better.  Continue inhalers.

## 2018-04-04 NOTE — Telephone Encounter (Signed)
Tina Bailey needs the [facility] form to be edited to list medications on the FL2 itself, and not list the medications on a separate attached sheet. FAX: [phone]

## 2018-04-04 NOTE — Assessment & Plan Note (Signed)
Ongoing issue for pt.  She seems more calm since weaning off Wellbutrin.  Doing well on Buspar.  Refill provided.

## 2018-04-04 NOTE — Telephone Encounter (Signed)
Copied from [place] [phone]. Topic: Quick Communication - Home Health Verbal Orders >> Apr 04, 2018 11:35 AM Ivar Drape wrote: Caller/Agency: [facility] Number:   [phone] Requesting OT/PT/Skilled Nursing/Social Work: Needs a verbal for Occupational Therapy Frequency:  2w2

## 2018-04-04 NOTE — Assessment & Plan Note (Signed)
Check labs to determine levels

## 2018-04-05 NOTE — Telephone Encounter (Signed)
Forms were faxed yesterday to [facility] and Daughter picked the forms up late yesterday afternoon Per Levada Dy.

## 2018-04-05 NOTE — Telephone Encounter (Signed)
FL2 form edited and faxed.

## 2018-04-05 NOTE — Telephone Encounter (Signed)
Do we still have this form?

## 2018-04-06 ENCOUNTER — Telehealth: Payer: Self-pay

## 2018-04-06 NOTE — Telephone Encounter (Signed)
Copied from [place]. Topic: General - Inquiry >> Apr 06, 2018  9:59 AM Margot Ables wrote: Reason for CRM: Seth Bake w/[facility] at [facility] Ph# [phone] Fax # [phone] Pt is moving in today and they are requesting H&P with med list and med allergies.

## 2018-04-06 NOTE — Telephone Encounter (Signed)
I have faxed the last office note to Seth Bake at [facility]. It contains the pt allergies.

## 2018-04-06 NOTE — Telephone Encounter (Signed)
I do not do H&P for pt's moving into facilities.  I completed her FL2 form.  I am happy to send a copy of her SnapShot but the H&P would need to be done by someone at their facility

## 2018-04-06 NOTE — Progress Notes (Signed)
[facility]   Telephone:([phone]) [phone] [phone]) [phone]   Clinic Follow up Note   Patient Care Team: Midge Minium, MD as PCP - General (Family Medicine) [place] Alar Jessy Oto, MD as Consulting Physician (Vascular Surgery) Stark Klein, MD as Consulting Physician (General Surgery) Truitt Merle, MD as Consulting Physician (Hematology) Suella Broad, MD as Consulting Physician (Physical Medicine and Rehabilitation) Valinda Party, MD as Consulting Physician (Rheumatology) Tanda Rockers, MD as Consulting Physician (Pulmonary Disease) Abbie Sons, MD as Referring Physician (Psychiatry) 04/12/2018  CHIEF COMPLAINT: F/u on [place]   SUMMARY OF ONCOLOGIC HISTORY: Oncology History   Hepatocellular carcinoma ([place])   Staging form: Liver (Excluding Intrahepatic Bile Ducts), AJCC 7th Edition     Clinical stage from 12/30/2014: Stage Unknown (T1, NX, M0) - Unsigned       Hepatocellular carcinoma ([place])   12/02/2014 Imaging    Liver MRI showed a large lesion in segment 5 and 40, highly suspicious for malignancy. And a 1.2 cm indeterminate pancreatic body lesion. Mildly enlarged portocaval lymph node. Cholelithiasis.    12/19/2014 Initial Diagnosis    Hepatocellular carcinoma ([place])    12/19/2014 Initial Biopsy    Liver mass biopsy showed parathyroid carcinoma, positive for hep par 1, cytokeratin 8/18, cytokeratin 7 and MOC-31.    12/25/2014 Imaging    CT chest showed a 3 mm groundglass probably nodule in the right upper lobe, mild right pericardial phrenic lymphadenopathy, no definitive evidence of metastasis.    01/13/2015 Procedure    Liquid embolization of the right portal venous system by Interventional Radiologist Dr. Laurence Ferrari    03/04/2015 Surgery    Liver right lobe hepatectomy, and the portal lymph nodes biopsy    03/04/2015 Pathology Results    Hepatocellular carcinoma, poorly differentiated, spanning 8.5 cm, surgical margins were negative, lymphovascular  invasion is identified, for portal lymph nodes were negative.     02/29/2016 Imaging    CT chest w contrast 02/29/2016 IMPRESSION: 1. Status post partial right hepatectomy, without recurrent or metastatic disease. 2. Removal of common duct stent since 06/10/2015. Upper normal to mildly dilated common duct after cholecystectomy. Recommend attention on follow-up. 3. Similar pancreatic head lesion, as detailed on prior MRI. 4. Pelvic floor laxity with cystocele. 5.  Aortic atherosclerosis.    10/14/2016 Imaging    MRI Abdomen W WO Contrast 10/14/16 IMPRESSION: 1. Stable appearance of the liver status post right hepatectomy. No evidence of recurrent tumor or metastatic disease. 2. Suggested slight interval enlargement of known cystic pancreatic mass, likely communicating with the main pancreatic duct and probably reflecting an intraductal papillary mucinous neoplasm. This demonstrates no aggressive characteristics, and continued surveillance suggested.     10/20/2017 Imaging    10/20/2017 MRI Abdomen IMPRESSION: 1. Stable appearance of the liver status post right hepatectomy. No evidence of recurrent tumor or metastatic disease. 2. Unchanged appearance of cystic pancreatic lesion which appears to communicate with the main pancreatic duct without main duct dilatation. Findings are compatible with intraductal papillary mucinous neoplasm. Continued interval follow-up is advised. The next follow-up examination should be obtained in 12 months. This recommendation follows ACR consensus guidelines: Management of Incidental Pancreatic Cysts: A White Paper of the ACR Incidental Findings Committee. [place] 4540;98:119-147.     CURRENT THERAPY Surveillance   INTERVAL HISTORY: Tina Bailey is a 73 y.o. female who is here for follow-up. Since our last visit, she had an abdominal MRI. She was also hospitalized several times for right hip pain after a fall  and peripheral edema.  Today, she is here with her family member. She is doing well. She currently stays at an assistance living facility. She has quit smoking and doesn't use O2. She experiences generalized body aches and joint pain. She is recovering from a recent hip and sternal fractures. She takes hydrocodone for the pain. She attends PT at the living facility.   Pertinent positives and negatives of review of systems are listed and detailed within the above HPI.  REVIEW OF SYSTEMS:   Constitutional: Denies fevers, chills or abnormal weight loss (+) generalized body aches and joint pains Eyes: Denies blurriness of vision Ears, nose, mouth, throat, and face: Denies mucositis or sore throat Respiratory: Denies cough, dyspnea or wheezes Cardiovascular: Denies palpitation, chest discomfort or lower extremity swelling Gastrointestinal:  Denies nausea, heartburn or change in bowel habits Skin: Denies abnormal skin rashes Lymphatics: Denies new lymphadenopathy or easy bruising Neurological:Denies numbness, tingling or new weaknesses Behavioral/Psych: Mood is stable, no new changes  All other systems were reviewed with the patient and are negative.  MEDICAL HISTORY:  Past Medical History:  Diagnosis Date  . Arthritis    DDD. Right shoulder"is frozen"-limited ROM. osteoporosis.  . Blue toe syndrome ([place]) 11/27/2014   Dr. Claudia Pollock evaluating  . Cancer ([place]) 12/2014   liver cancer  . Complication of anesthesia   . COPD (chronic obstructive pulmonary disease) ([place])   . Fibromyalgia   . Fracture of rib of right side    hx "osteoporosis"- states her dog nudge her on the side, next day developed great pain and was told has a fracture rib.  . Macular degeneration    R eye  . Osteoporosis   . PONV (postoperative nausea and vomiting)    nausea,severe vomiting after 01-13-15 portal vein embolization  . Productive cough   . Retina disorder    L eye, vision distorted, edema  . Spine fracture due to birth trauma   .  TMJ disease   . Wears glasses     SURGICAL HISTORY: Past Surgical History:  Procedure Laterality Date  . ANGIOPLASTY  2008   no stents required, no follow-up with cardiologist, no recurrent chest pain  . CATARACT EXTRACTION, BILATERAL Bilateral   . ESOPHAGOGASTRODUODENOSCOPY (EGD) WITH PROPOFOL N/A 06/25/2015   Procedure: ESOPHAGOGASTRODUODENOSCOPY (EGD) WITH PROPOFOL;  Surgeon: Milus Banister, MD;  Location: [facility];  Service: Endoscopy;  Laterality: N/A;  stent removal   . EYE SURGERY     cornea surgery  . INTRAMEDULLARY (IM) NAIL INTERTROCHANTERIC Right 12/04/2017   Procedure: INTRAMEDULLARY (IM) NAIL INTERTROCHANTRIC;  Surgeon: Rod Can, MD;  Location: [facility];  Service: Orthopedics;  Laterality: Right;  . IR RADIOLOGIST EVAL & MGMT  03/29/2017  . KIDNEY STONE SURGERY    . LAPAROSCOPIC PARTIAL HEPATECTOMY N/A 03/04/2015   Procedure: DIAGNOSTIC LAPAROSCOPY, EXTENDED RIGHT HEPATECTOMY, WITH INTRAOPERATIVE ULTRASOUND;  Surgeon: Stark Klein, MD;  Location: [facility];  Service: General;  Laterality: N/A;  . PARTIAL HYSTERECTOMY    . portal vein embolization     01-13-15 -Dr. Cathlean Sauer.    I have reviewed the social history and family history with the patient and they are unchanged from previous note.  ALLERGIES:  is allergic to sertraline hcl; amoxicillin-pot clavulanate; clindamycin/lincomycin; paroxetine hcl; and sulfa antibiotics.  MEDICATIONS:  Current Outpatient Medications  Medication Sig Dispense Refill  . acetaminophen (TYLENOL) 325 MG tablet Take 2 tablets (650 mg total) by mouth every 6 (six) hours as needed for mild pain (or Fever >/= 101).    Marland Kitchen  ALPRAZolam (XANAX) 0.25 MG tablet Take 1 tablet (0.25 mg total) by mouth 2 (two) times daily as needed for anxiety. 30 tablet 1  . Balsam [place]-Castor Oil (VENELEX) OINT Apply to bilateral buttocks and sacrum every shift for prevention    . benzonatate (TESSALON) 100 MG capsule Take 1 capsule (100 mg total) by mouth 3  (three) times daily as needed for cough. 30 capsule 0  . bisacodyl (DULCOLAX) 10 MG suppository Place 1 suppository (10 mg total) rectally daily as needed for moderate constipation. 12 suppository 0  . busPIRone (BUSPAR) 5 MG tablet Take 0.5 tablets (2.5 mg total) by mouth 2 (two) times daily. 30 tablet 3  . Calcium Carbonate-Vitamin D (CALCIUM 500+D HIGH POTENCY PO) Take 1 tablet by mouth daily.     . Cholecalciferol (VITAMIN D3) 5000 units CAPS Take 5,000 Units by mouth daily.    Marland Kitchen EVENITY 105 MG/1.17ML SOSY Inject 210 mg into the skin once. Take once a month on the 4th of the month    . feeding supplement, ENSURE ENLIVE, (ENSURE ENLIVE) LIQD Take 237 mLs by mouth 2 (two) times daily between meals. 237 mL 12  . Glycopyrrolate-Formoterol (BEVESPI AEROSPHERE) 9-4.8 MCG/ACT AERO Inhale 2 puffs into the lungs 2 (two) times daily. 1 Inhaler 11  . guaiFENesin (MUCINEX) 600 MG 12 hr tablet Take 2 tablets (1,200 mg total) by mouth 2 (two) times daily as needed. 30 tablet 0  . HYDROcodone-acetaminophen (NORCO/VICODIN) 5-325 MG tablet Take 1 tablet by mouth 3 (three) times daily. 90 tablet 0  . ipratropium-albuterol (DUONEB) 0.5-2.5 (3) MG/3ML SOLN Take 3 mLs by nebulization every 4 (four) hours as needed (SOB).     . Multiple Vitamins-Minerals (CENTRUM SILVER ULTRA WOMENS PO) Take 1 tablet by mouth daily.     . polyethylene glycol (MIRALAX / GLYCOLAX) packet Take 17 g by mouth daily. 14 each 0  . promethazine (PHENERGAN) 12.5 MG tablet Take 12.5 mg by mouth every 6 (six) hours as needed for nausea or vomiting.    . senna-docusate (SENOKOT-S) 8.6-50 MG tablet Take 1 tablet by mouth 2 (two) times daily.    Marland Kitchen spironolactone (ALDACTONE) 50 MG tablet Take 1 tablet (50 mg total) by mouth daily. 30 tablet 3  . thiamine 100 MG tablet Take 1 tablet (100 mg total) by mouth daily.    . vitamin B-12 (CYANOCOBALAMIN) 1000 MCG tablet Take 1,000 mcg by mouth daily.    Marland Kitchen LORazepam (ATIVAN) 0.5 MG tablet Take 0.25 mg by  mouth every 8 (eight) hours.     No current facility-administered medications for this visit.     PHYSICAL EXAMINATION: ECOG PERFORMANCE STATUS: 3 - Symptomatic, >50% confined to bed  Vitals:   04/12/18 1108  BP: 123/80  Pulse: 91  Resp: 17  Temp: 97.6 F (36.4 C)  SpO2: 91%   Filed Weights   04/12/18 1108  Weight: 91 lb 4.8 oz (41.4 kg)    GENERAL:alert, no distress and comfortable (+) uses a walker SKIN: skin color, texture, turgor are normal, no rashes or significant lesions EYES: normal, Conjunctiva are pink and non-injected, sclera clear OROPHARYNX:no exudate, no erythema and lips, buccal mucosa, and tongue normal  NECK: supple, thyroid normal size, non-tender, without nodularity LYMPH:  no palpable lymphadenopathy in the cervical, axillary or inguinal LUNGS: clear to auscultation and percussion with normal breathing effort HEART: regular rate & rhythm and no murmurs and no lower extremity edema ABDOMEN:abdomen soft, non-tender and normal bowel sounds Musculoskeletal:no cyanosis of digits and no  clubbing (+) lumps at the sternum from the previous fracture  NEURO: alert & oriented x 3 with fluent speech, no focal motor/sensory deficits  LABORATORY DATA:  I have reviewed the data as listed CBC Latest Ref Rng & Units 04/12/2018 04/02/2018 03/07/2018  WBC 4.0 - 10.5 K/uL 7.9 4.1 6.9  Hemoglobin 12.0 - 15.0 g/dL 13.9 13.9 16.1(H)  Hematocrit 36.0 - 46.0 % 44.1 42.7 54.0(H)  Platelets 150 - 400 K/uL 254 184.0 226     CMP Latest Ref Rng & Units 04/12/2018 04/02/2018 03/07/2018  Glucose 70 - 99 mg/dL 97 63(L) 129(H)  BUN 8 - 23 mg/dL 16 14 28(H)  Creatinine 0.44 - 1.00 mg/dL 0.61 0.53 0.52  Sodium 135 - 145 mmol/L 134(L) 136 138  Potassium 3.5 - 5.1 mmol/L 4.9 4.4 3.7  Chloride 98 - 111 mmol/L 96(L) 96 86(L)  CO2 22 - 32 mmol/L 31 36(H) 42(H)  Calcium 8.9 - 10.3 mg/dL 9.5 8.6 9.0  Total Protein 6.5 - 8.1 g/dL 6.8 - -  Total Bilirubin 0.3 - 1.2 mg/dL 0.4 - -  Alkaline  Phos 38 - 126 U/L 131(H) - -  AST 15 - 41 U/L 14(L) - -  ALT 0 - 44 U/L 12 - -      RADIOGRAPHIC STUDIES: I have personally reviewed the radiological images as listed and agreed with the findings in the report. No results found.   10/20/2017 MRI Abdomen IMPRESSION: 1. Stable appearance of the liver status post right hepatectomy. No evidence of recurrent tumor or metastatic disease. 2. Unchanged appearance of cystic pancreatic lesion which appears to communicate with the main pancreatic duct without main duct dilatation. Findings are compatible with intraductal papillary mucinous neoplasm. Continued interval follow-up is advised. The next follow-up examination should be obtained in 12 months. This recommendation follows ACR consensus guidelines: Management of Incidental Pancreatic Cysts: A White Paper of the ACR Incidental Findings Committee. [place] [id number];53:[id number].  ASSESSMENT & PLAN:  EVERLEY EVORA is a 73 y.o. female with history of  1. Hepatocellular carcinoma, pT3bN0M0, stage IIIB, poorly differentiated  -Diagnosed in 11/2014. Treated with embolization and right hepatectomy. Currently on surveillance. -I reviewed and discussed her 10/2017 MRI and CT from 12/2017, which showed no evidence of recurrence  -Labs reviewed, CBC is WNLs. CMP and AFP pending. -She is recovering from a recent hip and sternal fractures. She uses hydrocodone as needed for the pain. -She currently attends PT at her assistant living facility.  -From a cancer standpoint, she is doing well, no particular concern for recurrence. -I will repeat scan later this year. -fu in 6 months, plan to monitor her for 5 years.  2. Pancreatic lesion -f/u with MRI -Stable  3. COPD and smoking cessation  -f/u with PCP and Dr. Melvyn Novas -She has quit smoking completely  4. T11 compression fracture and multiple fractures and body pain  -She has had multiple fractures in her spine, sternum, and the right hip -I  encouraged her to follow-up with her orthopedic surgeon, and her pain specialist Dr. Idamae Schuller -I will ask IR if she is a candidate for osteocool, she could not tolerate kyphoplasty due to her chest deformity  5. Osteoporosis -Stopped FORTEO in 2018 because she didn't want injections -Currently on multivitamins and Vitamin d. Continue. -No record of 2019 DEXA. Last DEXA was in 2017 -She had multiple fractures including hip and sternal fractures. She prefers not to lay flat because fo her previous injuries and generalized body aches.  Plan  -f/u in 6 months with lab, plan to repeat liver scan later this year   No problem-specific Assessment & Plan notes found for this encounter.   No orders of the defined types were placed in this encounter.  All questions were answered. The patient knows to call the clinic with any problems, questions or concerns. No barriers to learning was detected. I spent 20 minutes counseling the patient face to face. The total time spent in the appointment was 25 minutes and more than 50% was on counseling and review of test results  I, Noor Dweik am acting as scribe for Dr. Truitt Merle.  I have reviewed the above documentation for accuracy and completeness, and I agree with the above.     Truitt Merle, MD 04/12/2018

## 2018-04-06 NOTE — Telephone Encounter (Signed)
Wasn't this on the FL2? It is in my faxed tray.

## 2018-04-09 DIAGNOSIS — I110 Hypertensive heart disease with heart failure: Secondary | ICD-10-CM | POA: Diagnosis not present

## 2018-04-09 DIAGNOSIS — E46 Unspecified protein-calorie malnutrition: Secondary | ICD-10-CM | POA: Diagnosis not present

## 2018-04-10 ENCOUNTER — Telehealth: Payer: Self-pay | Admitting: Family Medicine

## 2018-04-10 DIAGNOSIS — I110 Hypertensive heart disease with heart failure: Secondary | ICD-10-CM | POA: Diagnosis not present

## 2018-04-10 DIAGNOSIS — E46 Unspecified protein-calorie malnutrition: Secondary | ICD-10-CM | POA: Diagnosis not present

## 2018-04-10 MED ORDER — BENZONATATE 100 MG PO CAPS: 100.0000 mg | ORAL_CAPSULE | Freq: Three times a day (TID) | ORAL | 0 refills | Status: DC | PRN

## 2018-04-10 MED ORDER — GUAIFENESIN ER 600 MG PO TB12: 1200.0000 mg | ORAL_TABLET | Freq: Two times a day (BID) | ORAL | 0 refills | Status: DC | PRN

## 2018-04-10 NOTE — Telephone Encounter (Signed)
Reviewed in PCP absence. Would have her start Mucinex (plain) 1200 mg BID. I have printed a script for this as well as for Tessalon 100 mg TID PRN for cough. Ok to fax.

## 2018-04-10 NOTE — Telephone Encounter (Signed)
Copied from [place] [id number]. Topic: General - Other >> Apr 10, 2018 10:21 AM Lennox Solders wrote: Reason for CRM: [facility] med tech supervisor is calling the pt has chest congestion this started over the weekend and they are requesting a order for mucinex dm or whatever md recommends. Pt lives at [facility]. Please fax rx to [phone] and phone [phone]. Pt has only being at [facility] for 2 wk

## 2018-04-10 NOTE — Telephone Encounter (Signed)
Prescriptions faxed to [facility].

## 2018-04-11 DIAGNOSIS — E46 Unspecified protein-calorie malnutrition: Secondary | ICD-10-CM | POA: Diagnosis not present

## 2018-04-11 DIAGNOSIS — I110 Hypertensive heart disease with heart failure: Secondary | ICD-10-CM | POA: Diagnosis not present

## 2018-04-12 ENCOUNTER — Telehealth: Payer: Self-pay | Admitting: Hematology

## 2018-04-12 ENCOUNTER — Inpatient Hospital Stay: Payer: Medicare Other

## 2018-04-12 ENCOUNTER — Encounter: Payer: Self-pay | Admitting: Hematology

## 2018-04-12 ENCOUNTER — Telehealth: Payer: Self-pay | Admitting: Family Medicine

## 2018-04-12 ENCOUNTER — Inpatient Hospital Stay: Payer: Medicare Other | Attending: Hematology | Admitting: Hematology

## 2018-04-12 VITALS — BP 123/80 | HR 91 | Temp 97.6°F | Resp 17 | Ht 63.0 in | Wt 91.3 lb

## 2018-04-12 DIAGNOSIS — J449 Chronic obstructive pulmonary disease, unspecified: Secondary | ICD-10-CM | POA: Diagnosis not present

## 2018-04-12 DIAGNOSIS — Z87891 Personal history of nicotine dependence: Secondary | ICD-10-CM | POA: Diagnosis not present

## 2018-04-12 DIAGNOSIS — Z79899 Other long term (current) drug therapy: Secondary | ICD-10-CM | POA: Insufficient documentation

## 2018-04-12 DIAGNOSIS — I110 Hypertensive heart disease with heart failure: Secondary | ICD-10-CM | POA: Diagnosis not present

## 2018-04-12 DIAGNOSIS — M1990 Unspecified osteoarthritis, unspecified site: Secondary | ICD-10-CM

## 2018-04-12 DIAGNOSIS — Z9049 Acquired absence of other specified parts of digestive tract: Secondary | ICD-10-CM | POA: Insufficient documentation

## 2018-04-12 DIAGNOSIS — C220 Liver cell carcinoma: Secondary | ICD-10-CM

## 2018-04-12 DIAGNOSIS — Z8505 Personal history of malignant neoplasm of liver: Secondary | ICD-10-CM | POA: Diagnosis not present

## 2018-04-12 DIAGNOSIS — E46 Unspecified protein-calorie malnutrition: Secondary | ICD-10-CM | POA: Diagnosis not present

## 2018-04-12 LAB — CBC WITH DIFFERENTIAL/PLATELET
Abs Immature Granulocytes: 0.02 10*3/uL (ref 0.00–0.07)
Basophils Absolute: 0 10*3/uL (ref 0.0–0.1)
Basophils Relative: 0 %
Eosinophils Absolute: 0 10*3/uL (ref 0.0–0.5)
Eosinophils Relative: 0 %
HCT: 44.1 % (ref 36.0–46.0)
Hemoglobin: 13.9 g/dL (ref 12.0–15.0)
Immature Granulocytes: 0 %
Lymphocytes Relative: 23 %
Lymphs Abs: 1.8 10*3/uL (ref 0.7–4.0)
MCH: 28.8 pg (ref 26.0–34.0)
MCHC: 31.5 g/dL (ref 30.0–36.0)
MCV: 91.5 fL (ref 80.0–100.0)
Monocytes Absolute: 0.8 10*3/uL (ref 0.1–1.0)
Monocytes Relative: 10 %
Neutro Abs: 5.3 10*3/uL (ref 1.7–7.7)
Neutrophils Relative %: 67 %
Platelets: 254 10*3/uL (ref 150–400)
RBC: 4.82 MIL/uL (ref 3.87–5.11)
RDW: 17.5 % — ABNORMAL HIGH (ref 11.5–15.5)
WBC: 7.9 10*3/uL (ref 4.0–10.5)
nRBC: 0 % (ref 0.0–0.2)

## 2018-04-12 LAB — COMPREHENSIVE METABOLIC PANEL
ALT: 12 U/L (ref 0–44)
AST: 14 U/L — ABNORMAL LOW (ref 15–41)
Albumin: 3.6 g/dL (ref 3.5–5.0)
Alkaline Phosphatase: 131 U/L — ABNORMAL HIGH (ref 38–126)
Anion gap: 7 (ref 5–15)
BUN: 16 mg/dL (ref 8–23)
CO2: 31 mmol/L (ref 22–32)
Calcium: 9.5 mg/dL (ref 8.9–10.3)
Chloride: 96 mmol/L — ABNORMAL LOW (ref 98–111)
Creatinine, Ser: 0.61 mg/dL (ref 0.44–1.00)
GFR calc Af Amer: >60 mL/min (ref >60)
GFR calc non Af Amer: >60 mL/min (ref >60)
Glucose, Bld: 97 mg/dL (ref 70–99)
Potassium: 4.9 mmol/L (ref 3.5–5.1)
Sodium: 134 mmol/L — ABNORMAL LOW (ref 135–145)
Total Bilirubin: 0.4 mg/dL (ref 0.3–1.2)
Total Protein: 6.8 g/dL (ref 6.5–8.1)

## 2018-04-12 NOTE — Telephone Encounter (Signed)
Scheduled appt per 01/23 los. ° °Printed calendar and avs. °

## 2018-04-12 NOTE — Telephone Encounter (Signed)
Please advise 

## 2018-04-12 NOTE — Telephone Encounter (Signed)
Called and tried speaking with Seth Bake. Was advised she would not be in until tomorrow. Fax sent with updated med list and PCP recommendations on the cover sheet.   Will call tomorrow and speak with Seth Bake.

## 2018-04-12 NOTE — Telephone Encounter (Signed)
Copied from [place] [phone]. Topic: General - Inquiry >> Apr 12, 2018  9:21 AM Judyann Munson wrote: Reason for CRM: Seth Bake From [facility] is calling to request a RX  for HYDROcodone-acetaminophen (NORCO/VICODIN) 5-325 MG tablet  for 3 times daily. She is also requesting a increase in the patient RX  ALPRAZolam (XANAX) 0.25 MG tablet. She stated the patient has been stating lately  she doesn't want to be here and has been extremely depressed. 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 She is hoping to have some form of depression medication to help the patient. The best fax number is [phone] the contact number is [phone]. Please advise

## 2018-04-12 NOTE — Telephone Encounter (Signed)
Please print and fax med list (buspar is on it) Hydrocodone was just sent on 1/13- no updated script at this time We will NOT increase the Alprazolam, the goal was to wean her off these medications if possible If we need to address her depression, I would prefer that to be a visit in the office and not something we try and do over the phone

## 2018-04-12 NOTE — Telephone Encounter (Signed)
Tina Bailey called back and stated that they need a updated med list because the one they have does not have the busPIRone (BUSPAR) 5 MG tablet [[id number]] on it

## 2018-04-13 ENCOUNTER — Encounter: Payer: Self-pay | Admitting: Cardiology

## 2018-04-13 ENCOUNTER — Ambulatory Visit (INDEPENDENT_AMBULATORY_CARE_PROVIDER_SITE_OTHER): Payer: Medicare Other | Admitting: Cardiology

## 2018-04-13 VITALS — BP 124/70 | HR 87 | Ht 63.0 in | Wt 92.0 lb

## 2018-04-13 DIAGNOSIS — R600 Localized edema: Secondary | ICD-10-CM | POA: Diagnosis not present

## 2018-04-13 DIAGNOSIS — I272 Pulmonary hypertension, unspecified: Secondary | ICD-10-CM

## 2018-04-13 DIAGNOSIS — I712 Thoracic aortic aneurysm, without rupture, unspecified: Secondary | ICD-10-CM | POA: Insufficient documentation

## 2018-04-13 DIAGNOSIS — I5032 Chronic diastolic (congestive) heart failure: Secondary | ICD-10-CM | POA: Diagnosis not present

## 2018-04-13 LAB — AFP TUMOR MARKER: AFP, Serum, Tumor Marker: 4.3 ng/mL (ref 0.0–8.3)

## 2018-04-13 NOTE — Progress Notes (Signed)
Cardiology Office Note:    Date:  04/13/2018   ID:  Tina Bailey, DOB 07-30-1945, MRN [id number]  PCP:  Midge Minium, MD  Cardiologist:  Buford Dresser, MD PhD  Referring MD: Midge Minium, MD   CC: re-establish care  History of Present Illness:    Tina Bailey is a 73 y.o. female with a hx of hepatocellular carcinoma s/p right hepatectomy 2016, chronic joint pain due to arthritis, history of tobacco abuse (~50 years), COPD who is seen as a new patient to me (last seen by Dr. Oval Linsey in 2016 for preop clearance) at the request of Midge Minium, MD for the evaluation and management of chronic diastolic heart failure.  She was admitted for LE edema in 02/2018. She had several fractures (femur, sternum) in the fall and noted several weeks after that she developed LE edema. Doppler negative for DVT. Echo done during that hospitalization was notable for grade 1 diastolic dysfunction, severe pulmonary hypertension, and right atrial mass. We reviewed today. She is adamant that she would never want (and would likely not be a candidate for) any cardiothoracic surgery. Her swelling improved with diuresis. She diuresed well in the hospital with lasix, but she is allergic to sulfa. She had a rash and itching which she attributed to lasix, but it did not improve once lasix was stopped. Changed to spironolactone, itching has not changed. Swelling much improved, only sock lines.   Imaging that admission also noted incidental TAA, nonruptured. Had grandmother with ruputured aortic aneurysm. Again states that she would not want CT surgery even if she was a candidate. Not indicated now given the size, but we discussed routine monitoring, and she is not sure this is useful if she wouldn't want surgery for it.  Past Medical History:  Diagnosis Date  . Arthritis    DDD. Right shoulder"is frozen"-limited ROM. osteoporosis.  . Blue toe syndrome ([place]) 11/27/2014   Dr. Claudia Pollock  evaluating  . Cancer ([place]) 12/2014   liver cancer  . Complication of anesthesia   . COPD (chronic obstructive pulmonary disease) ([place])   . Fibromyalgia   . Fracture of rib of right side    hx "osteoporosis"- states her dog nudge her on the side, next day developed great pain and was told has a fracture rib.  . Macular degeneration    R eye  . Osteoporosis   . PONV (postoperative nausea and vomiting)    nausea,severe vomiting after 01-13-15 portal vein embolization  . Productive cough   . Retina disorder    L eye, vision distorted, edema  . Spine fracture due to birth trauma   . TMJ disease   . Wears glasses     Past Surgical History:  Procedure Laterality Date  . ANGIOPLASTY  2008   no stents required, no follow-up with cardiologist, no recurrent chest pain  . CATARACT EXTRACTION, BILATERAL Bilateral   . ESOPHAGOGASTRODUODENOSCOPY (EGD) WITH PROPOFOL N/A 06/25/2015   Procedure: ESOPHAGOGASTRODUODENOSCOPY (EGD) WITH PROPOFOL;  Surgeon: Milus Banister, MD;  Location: [facility];  Service: Endoscopy;  Laterality: N/A;  stent removal   . EYE SURGERY     cornea surgery  . INTRAMEDULLARY (IM) NAIL INTERTROCHANTERIC Right 12/04/2017   Procedure: INTRAMEDULLARY (IM) NAIL INTERTROCHANTRIC;  Surgeon: Rod Can, MD;  Location: [facility];  Service: Orthopedics;  Laterality: Right;  . IR RADIOLOGIST EVAL & MGMT  03/29/2017  . KIDNEY STONE SURGERY    . LAPAROSCOPIC PARTIAL HEPATECTOMY N/A 03/04/2015   Procedure: DIAGNOSTIC  LAPAROSCOPY, EXTENDED RIGHT HEPATECTOMY, WITH INTRAOPERATIVE ULTRASOUND;  Surgeon: Stark Klein, MD;  Location: [facility];  Service: General;  Laterality: N/A;  . PARTIAL HYSTERECTOMY    . portal vein embolization     01-13-15 -Dr. Cathlean Sauer.    Current Medications: Current Outpatient Medications on File Prior to Visit  Medication Sig  . acetaminophen (TYLENOL) 325 MG tablet Take 2 tablets (650 mg total) by mouth every 6 (six) hours as needed for mild pain (or  Fever >/= 101).  Marland Kitchen ALPRAZolam (XANAX) 0.25 MG tablet Take 1 tablet (0.25 mg total) by mouth 2 (two) times daily as needed for anxiety.  . benzonatate (TESSALON) 100 MG capsule Take 1 capsule (100 mg total) by mouth 3 (three) times daily as needed for cough.  . bisacodyl (DULCOLAX) 10 MG suppository Place 1 suppository (10 mg total) rectally daily as needed for moderate constipation.  . Calcium Carbonate-Vitamin D (CALCIUM 500+D HIGH POTENCY PO) Take 1 tablet by mouth daily.   . Cholecalciferol (VITAMIN D3) 5000 units CAPS Take 5,000 Units by mouth daily.  Marland Kitchen EVENITY 105 MG/1.17ML SOSY Inject 210 mg into the skin once. Take once a month on the 4th of the month  . feeding supplement, ENSURE ENLIVE, (ENSURE ENLIVE) LIQD Take 237 mLs by mouth 2 (two) times daily between meals.  . Glycopyrrolate-Formoterol (BEVESPI AEROSPHERE) 9-4.8 MCG/ACT AERO Inhale 2 puffs into the lungs 2 (two) times daily.  Marland Kitchen guaiFENesin (MUCINEX) 600 MG 12 hr tablet Take 2 tablets (1,200 mg total) by mouth 2 (two) times daily as needed.  Marland Kitchen HYDROcodone-acetaminophen (NORCO/VICODIN) 5-325 MG tablet Take 1 tablet by mouth 3 (three) times daily.  Marland Kitchen ipratropium-albuterol (DUONEB) 0.5-2.5 (3) MG/3ML SOLN Take 3 mLs by nebulization every 4 (four) hours as needed (SOB).   . Multiple Vitamins-Minerals (CENTRUM SILVER ULTRA WOMENS PO) Take 1 tablet by mouth daily.   . polyethylene glycol (MIRALAX / GLYCOLAX) packet Take 17 g by mouth daily.  . promethazine (PHENERGAN) 12.5 MG tablet Take 12.5 mg by mouth every 6 (six) hours as needed for nausea or vomiting.  . senna-docusate (SENOKOT-S) 8.6-50 MG tablet Take 1 tablet by mouth 2 (two) times daily.  Marland Kitchen spironolactone (ALDACTONE) 50 MG tablet Take 1 tablet (50 mg total) by mouth daily.  Marland Kitchen thiamine 100 MG tablet Take 1 tablet (100 mg total) by mouth daily.  . vitamin B-12 (CYANOCOBALAMIN) 1000 MCG tablet Take 1,000 mcg by mouth daily.  . busPIRone (BUSPAR) 5 MG tablet Take 0.5 tablets (2.5 mg  total) by mouth 2 (two) times daily.   No current facility-administered medications on file prior to visit.      Allergies:   Sertraline hcl; Amoxicillin-pot clavulanate; Clindamycin/lincomycin; Paroxetine hcl; and Sulfa antibiotics   Social History   Socioeconomic History  . Marital status: Divorced    Spouse name: Not on file  . Number of children: Not on file  . Years of education: Not on file  . Highest education level: Not on file  Occupational History  . Not on file  Social Needs  . Financial resource strain: Not on file  . Food insecurity:    Worry: Not on file    Inability: Not on file  . Transportation needs:    Medical: Not on file    Non-medical: Not on file  Tobacco Use  . Smoking status: Former Smoker    Packs/day: 0.50    Years: 45.00    Pack years: 22.50    Types: Cigarettes  Last attempt to quit: 02/20/2018    Years since quitting: 0.1  . Smokeless tobacco: Never Used  Substance and Sexual Activity  . Alcohol use: No    Alcohol/week: 0.0 standard drinks  . Drug use: No  . Sexual activity: Not on file  Lifestyle  . Physical activity:    Days per week: Not on file    Minutes per session: Not on file  . Stress: Not on file  Relationships  . Social connections:    Talks on phone: Not on file    Gets together: Not on file    Attends religious service: Not on file    Active member of club or organization: Not on file    Attends meetings of clubs or organizations: Not on file    Relationship status: Not on file  Other Topics Concern  . Not on file  Social History Narrative  . Not on file     Family History: The patient's family history includes Atrial fibrillation in her mother; Dementia in her father; Heart attack in her brother; Heart disease in her brother and mother; Hypertension in her mother; Stroke in her father.  ROS:   Please see the history of present illness.  Additional pertinent ROS:  Constitutional: Negative for chills, fever,  night sweats. Positive for weight loss  HENT: Negative for ear pain and hearing loss.   Eyes: Negative for loss of vision and eye pain.  Respiratory: Positive for chronic cough and shortness of breath. Negative for sputum, wheezing.   Cardiovascular: Positive for mild LE edema. Negative for chest pain, palpitations, PND, orthopnea, and claudication.  Gastrointestinal: Negative for abdominal pain, melena, and hematochezia.  Genitourinary: Negative for dysuria and hematuria.  Musculoskeletal: Positive for falls and myalgias.  Skin: Positive for itching and rash.  Neurological: Negative for focal weakness, focal sensory changes and loss of consciousness.  Endo/Heme/Allergies: Does bruise/bleed easily.    EKGs/Labs/Other Studies Reviewed:    The following studies were reviewed today: Echo 03/20/18 - Left ventricle: The cavity size was normal. Systolic function was   normal. The estimated ejection fraction was in the range of 55%   to 60%. Wall motion was normal; there were no regional wall   motion abnormalities. Doppler parameters are consistent with   abnormal left ventricular relaxation (grade 1 diastolic   dysfunction). - Right ventricle: The cavity size was mildly dilated. Wall   thickness was normal. Systolic function was mildly reduced. - Right atrium: There was a mass in the atrial cavity adjacent to   the intra-atrial septum. - Tricuspid valve: There was moderate regurgitation. - Pulmonary arteries: Systolic pressure was severely increased. PA   peak pressure: 60 mm Hg (S). - Pericardium, extracardiac: A small pericardial effusion was   identified. There is mitral inflow respiratory variability, but   no clear RA/RV collapse.  Impressions:  - Normal LV EF, grade 1 diastolic dysfunction.   There is a structure in the right atrial cavity adjacent to the   intra-atrial septum that cannot be well defined. Could be very   hypertrophied septum, though favor other etiology,  cannot exclude   thrombus, myxoma, or other.   Severe pulmonary hypertension, with mild RV dysfunction and   dilation.   Small pericardial effusion, predominantly posterior. There is   mitral inflow variability, though this may be unrelated to   effusion. No RA/RV collapse seen.  Arterial duplex 02/20/18 Right: Total occlusion noted in the superficial femoral artery with reconstitution in the popliteal artery.  ABI 02/20/18 Right: Resting right ankle-brachial index indicates moderate right lower extremity arterial disease. The right toe-brachial index is abnormal.  Left: Resting left ankle-brachial index indicates mild left lower extremity arterial disease. The left toe-brachial index is abnormal.  Nuclear stress 01/12/15  Nuclear stress EF: 67%.  There was no ST segment deviation noted during stress.  The study is normal.  This is a low risk study.  The left ventricular ejection fraction is hyperdynamic (>65%).  EKG:  EKG is personally reviewed.  The ekg ordered today demonstrates normal sinus rhythm, right axis deviation.  Recent Labs: 02/27/2018: Pro B Natriuretic peptide (BNP) 749.0 03/01/2018: TSH 1.262 03/04/2018: Magnesium 2.2 03/05/2018: B Natriuretic Peptide 442.9 04/12/2018: ALT 12; BUN 16; Creatinine, Ser 0.61; Hemoglobin 13.9; Platelets 254; Potassium 4.9; Sodium 134  Recent Lipid Panel    Component Value Date/Time   CHOL 188 01/09/2017 1109   TRIG 71.0 01/09/2017 1109   HDL 91.40 01/09/2017 1109   CHOLHDL 2 01/09/2017 1109   VLDL 14.2 01/09/2017 1109   LDLCALC 82 01/09/2017 1109    Physical Exam:    VS:  BP 124/70   Pulse 87   Ht 5\' 3"  (1.6 m)   Wt 92 lb (41.7 kg)   BMI 16.30 kg/m     Wt Readings from Last 3 Encounters:  04/13/18 92 lb (41.7 kg)  04/12/18 91 lb 4.8 oz (41.4 kg)  04/02/18 91 lb 8 oz (41.5 kg)     GEN: frail, thin appearing elderly women with kyphotic spine and protuding sternum HEENT: Normal NECK: No JVD at 90 degrees; No  carotid bruits LYMPHATICS: No lymphadenopathy CARDIAC: regular rhythm, normal S1 and S2, no murmurs, rubs, gallops. Radial pulses 2+ bilaterally. DP pulses faint bilaterally RESPIRATORY:  Distant but clear to auscultation without rales, wheezing or rhonchi  ABDOMEN: Soft, non-tender, non-distended MUSCULOSKELETAL:  Trace bilateral LE edema; No deformity  SKIN: Warm and dry NEUROLOGIC:  Alert and oriented x 3 PSYCHIATRIC:  Normal affect   ASSESSMENT:    1. Chronic diastolic heart failure (HCC)   2. Pulmonary hypertension, unspecified ([place])   3. Bilateral leg edema   4. Thoracic aortic aneurysm without rupture (H[facility])    PLAN:    Chronic diastolic heart failure, severe pulmonary hypertension on echo, resolving bilateral LE edema: I suspect these are all related. She has COPD and a very long tobacco use history, with only recent cessation. She has only grade 1 diastolic dysfunction but enlarged RA and RV. I would suspect that pulmonary hypertension is due to underlying lung disease, but I cannot exclude other causes. She has expressed to me a desire to avoid unnecessary tests and procedures. Given this, I suggest the following: -continue spironolactone. It does not contain a sulfonamide group, so it should not be affected by her sulfa allergy. Seems to have improved her LE edema. Now at assisted living facility, so limited ability to monitor her salt in food, daily weights, etc. -continue tobacco abstinence -will defer further invasive testing, such as RHC, etc for etiology of her pulmonary hypertension as patient does not want procedures -unclear etiology of right atrial mass and its cause/involvement based on her prior echo, but she does not want surgery, and she has multiple recent falls resulting in fracture, putting her at high bleeding risk if anticoagulation would be considered (ie if clot considered).   Thoracic aortic aneurysm: noted incidentally on recent imaging. She notes that she  would not want surgery and therefore does not see the utility in  monitoring it. Blood pressure well controlled. Would consider statin, though with history of fibromyalgias, hepatectomy I am concerned about side effects with her. Has history of PAD as well. Will continue to discuss risk/benefit, though there has been discussion of palliative care per recent hospital notes.  Overall patient and her daughter are aiming for symptom relief as the primary goal. From a cardiac standpoint, spironolactone appears to be managing her edema. Would continue with this medication at this time.  Plan for follow up: 1 year or sooner PRN  Medication Adjustments/Labs and Tests Ordered: Current medicines are reviewed at length with the patient today.  Concerns regarding medicines are outlined above.  Orders Placed This Encounter  Procedures  . EKG 12-Lead   No orders of the defined types were placed in this encounter.   Patient Instructions  Medication Instructions:  Your Physician recommend you continue on your current medication as directed.    If you need a refill on your cardiac medications before your next appointment, please call your pharmacy.   Lab work: None  Testing/Procedures: None  Follow-Up: At [facility], you and your health needs are our priority.  As part of our continuing mission to provide you with exceptional heart care, we have created designated Provider Care Teams.  These Care Teams include your primary Cardiologist (physician) and Advanced Practice Providers (APPs -  Physician Assistants and Nurse Practitioners) who all work together to provide you with the care you need, when you need it. You will need a follow up appointment in 1 years.  Please call our office 2 months in advance to schedule this appointment.  You may see Dr. Harrell Gave or one of the following Advanced Practice Providers on your designated Care Team:   Rosaria Ferries, PA-C . Jory Sims, DNP,  ANP        Signed, Buford Dresser, MD PhD 04/13/2018 8:39 PM    [place]

## 2018-04-13 NOTE — Telephone Encounter (Signed)
Called and LM with another staff member to advise on PCP recommendations. They stated an understanding.

## 2018-04-13 NOTE — Patient Instructions (Signed)

## 2018-04-16 ENCOUNTER — Telehealth: Payer: Self-pay | Admitting: *Deleted

## 2018-04-16 ENCOUNTER — Encounter: Payer: Self-pay | Admitting: Family Medicine

## 2018-04-16 ENCOUNTER — Ambulatory Visit (INDEPENDENT_AMBULATORY_CARE_PROVIDER_SITE_OTHER): Payer: Medicare Other | Admitting: Family Medicine

## 2018-04-16 ENCOUNTER — Telehealth: Payer: Self-pay | Admitting: Family Medicine

## 2018-04-16 ENCOUNTER — Other Ambulatory Visit: Payer: Self-pay

## 2018-04-16 VITALS — BP 118/72 | HR 82 | Temp 98.2°F | Resp 17 | Ht 63.0 in | Wt 95.0 lb

## 2018-04-16 DIAGNOSIS — G894 Chronic pain syndrome: Secondary | ICD-10-CM | POA: Diagnosis not present

## 2018-04-16 DIAGNOSIS — L299 Pruritus, unspecified: Secondary | ICD-10-CM

## 2018-04-16 DIAGNOSIS — I5033 Acute on chronic diastolic (congestive) heart failure: Secondary | ICD-10-CM | POA: Diagnosis not present

## 2018-04-16 NOTE — Progress Notes (Signed)
   Subjective:    Patient ID: Tina Bailey, female    DOB: Nov 21, 1945, 73 y.o.   MRN: [id number]  HPI Diastolic CHF- pt was switched from Lasix to Spironolactone at last visit.  This has improved her swelling and recent Cr and K+ are WNL.  No CP, SOB, edema.  Itching- pt continues to itch despite switching away from Sulfa medication.  She continues to scratch her back which occasionally breaks the skin.  Itching improves w/ Cortisone 10.  Pt wonders if her pain meds can cause itching.   Review of Systems For ROS see HPI     Objective:   Physical Exam Vitals signs reviewed.  Constitutional:      Comments: Cachectic, appears older than stated age  HENT:     Head: Normocephalic and atraumatic.  Cardiovascular:     Rate and Rhythm: Normal rate and regular rhythm.     Pulses: Normal pulses.     Heart sounds: No murmur.  Pulmonary:     Effort: Pulmonary effort is normal.     Breath sounds: No wheezing or rhonchi.  Abdominal:     General: Abdomen is flat. There is no distension.     Palpations: Abdomen is soft.     Tenderness: There is no abdominal tenderness.  Musculoskeletal:        General: Swelling (trace edema of LEs bilaterally) present.  Neurological:     Mental Status: She is alert. Mental status is at baseline.           Assessment & Plan:  Itching- reviewed that narcotics frequently cause itching.  Pt has appt w/ pain management later today.  Encouraged daughter to discuss itching and possible alternatives to narcotics.  Daughter expressed understanding.  Will follow.

## 2018-04-16 NOTE — Telephone Encounter (Signed)
TCT patient's daughter, [name].  Spoke with her  And informed her that the tumor marker (AFP) was normal, as were her other blood tests.  [name] voiced understanding and was appreciative of call.

## 2018-04-16 NOTE — Telephone Encounter (Signed)
I have placed a HH Cert. And plan of care in the bin up front with a charge sheet.  °

## 2018-04-16 NOTE — Telephone Encounter (Signed)
-----   Message from Truitt Merle, MD sent at 04/13/2018  9:00 PM EST ----- Please let pt or her daughter know her lab results, AFP WNL, no other concerns, thanks   Truitt Merle  04/13/2018

## 2018-04-16 NOTE — Assessment & Plan Note (Signed)
Pt doing well on current dose of Spironolactone.  Only trace edema bilaterally.  Reviewed recent CMP and Cr and K+ WNL.  No need to repeat today.

## 2018-04-16 NOTE — Patient Instructions (Addendum)
Schedule your complete physical in 6 months No need for labs today Continue the Spironolactone daily Call with any questions or concerns Hang in there!!!

## 2018-04-16 NOTE — Telephone Encounter (Signed)
Paperwork will be given to PCP for signature.  

## 2018-04-17 DIAGNOSIS — I110 Hypertensive heart disease with heart failure: Secondary | ICD-10-CM | POA: Diagnosis not present

## 2018-04-17 DIAGNOSIS — E46 Unspecified protein-calorie malnutrition: Secondary | ICD-10-CM | POA: Diagnosis not present

## 2018-04-18 ENCOUNTER — Telehealth: Payer: Self-pay | Admitting: Family Medicine

## 2018-04-18 DIAGNOSIS — E46 Unspecified protein-calorie malnutrition: Secondary | ICD-10-CM | POA: Diagnosis not present

## 2018-04-18 DIAGNOSIS — I110 Hypertensive heart disease with heart failure: Secondary | ICD-10-CM | POA: Diagnosis not present

## 2018-04-18 NOTE — Telephone Encounter (Signed)
noted 

## 2018-04-18 NOTE — Telephone Encounter (Signed)
FYI

## 2018-04-18 NOTE — Telephone Encounter (Signed)
Copied from [place] [phone]. Topic: Quick Communication - See Telephone Encounter >> Apr 18, 2018 11:04 AM Rayann Heman wrote: CRM for notification. See Telephone encounter for: 04/18/18 jim from [facility] called and stated that he would like to give a report on the patient. Patient reports feeling fair to poor today. Patients vitals: Blood pressure seated is 140/80. standing  is 120/80 with sudden weakness, but stops when seated. Patient reports several days with lose stools. Nausea last 12 hours. Jim's next planned visit is 04/20/18

## 2018-04-19 ENCOUNTER — Telehealth: Payer: Self-pay | Admitting: Family Medicine

## 2018-04-19 NOTE — Telephone Encounter (Signed)
Paperwork given to PCP for completion.  

## 2018-04-19 NOTE — Telephone Encounter (Signed)
I have placed a provider order request form in the bin upfront from [facility]. With a chargesheet.

## 2018-04-20 ENCOUNTER — Other Ambulatory Visit: Payer: Self-pay | Admitting: Internal Medicine

## 2018-04-20 DIAGNOSIS — I272 Pulmonary hypertension, unspecified: Secondary | ICD-10-CM | POA: Diagnosis not present

## 2018-04-20 DIAGNOSIS — Z96649 Presence of unspecified artificial hip joint: Secondary | ICD-10-CM

## 2018-04-20 DIAGNOSIS — J449 Chronic obstructive pulmonary disease, unspecified: Secondary | ICD-10-CM | POA: Diagnosis not present

## 2018-04-20 DIAGNOSIS — E46 Unspecified protein-calorie malnutrition: Secondary | ICD-10-CM

## 2018-04-20 DIAGNOSIS — I110 Hypertensive heart disease with heart failure: Secondary | ICD-10-CM | POA: Diagnosis not present

## 2018-04-20 DIAGNOSIS — Z720 Tobacco use: Secondary | ICD-10-CM

## 2018-04-20 DIAGNOSIS — L309 Dermatitis, unspecified: Secondary | ICD-10-CM | POA: Diagnosis not present

## 2018-04-20 DIAGNOSIS — M8088XD Other osteoporosis with current pathological fracture, vertebra(e), subsequent encounter for fracture with routine healing: Secondary | ICD-10-CM

## 2018-04-20 NOTE — Telephone Encounter (Signed)
Picked up and faxed to the # located on the form

## 2018-04-20 NOTE — Telephone Encounter (Signed)
Forms regarding stool have been sent to provider from facility. Will fax back to them upon completion.

## 2018-04-20 NOTE — Telephone Encounter (Signed)
Copied from [place] [id number]. Topic: General - Inquiry >> Apr 20, 2018 10:32 AM Judyann Munson wrote: Reason for CRM: [facility] is calling to to report the patient has been discharged  from the agency. The patient is leaving in a assisted living faculty. The patient has reported loose stool for 3-4 days with a reduce loss of appetite.  Contact number for Jeneen Rinks is [phone].

## 2018-04-20 NOTE — Telephone Encounter (Signed)
Form completed and placed in basket  

## 2018-04-30 ENCOUNTER — Telehealth: Payer: Self-pay | Admitting: Family Medicine

## 2018-04-30 NOTE — Telephone Encounter (Unsigned)
Copied from [place] [phone]. Topic: Quick Communication - Rx Refill/Question >> Apr 30, 2018  2:25 PM Alanda Slim E wrote: Medication: Calcium Carbonate-Vitamin D (CALCIUM 500+D HIGH POTENCY PO)  Cholecalciferol (VITAMIN D3) 5000 units CAPS  Multiple Vitamins-Minerals (CENTRUM SILVER ULTRA WOMENS PO)  vitamin B-12 (CYANOCOBALAMIN) 1000 MCG tablet  Pt's daughter Tina Bailey) called to see if there is a multi vitamin that can replace the above medications. Pt is given at least 10 pills every morning to take and she along with the assisted living facility wants to see if something can be done to decrease the amount of pill Pt has to take daily.   Pt's daughter also wants to know if the senna-docusate (SENOKOT-S) 8.6-50 MG tablet and polyethylene glycol (MIRALAX / GLYCOLAX) packet can be on an as needed basis. The senna-docusate (SENOKOT-S) 8.6-50 MG tablet was prescribed by th Dr. At the ER when th Pt went to the hospital and the polyethylene glycol (MIRALAX / GLYCOLAX) packet was prescribed by Dr. Debbe Odea. Derrek Monaco call the daughter cb# [phone] and advise

## 2018-05-01 ENCOUNTER — Encounter: Payer: Self-pay | Admitting: General Practice

## 2018-05-01 DIAGNOSIS — M810 Age-related osteoporosis without current pathological fracture: Secondary | ICD-10-CM | POA: Diagnosis not present

## 2018-05-01 MED ORDER — POLYETHYLENE GLYCOL 3350 17 G PO PACK: 17.0000 g | PACK | Freq: Every day | ORAL | 6 refills | Status: DC | PRN

## 2018-05-01 MED ORDER — SENNOSIDES-DOCUSATE SODIUM 8.6-50 MG PO TABS: 1.0000 | ORAL_TABLET | Freq: Two times a day (BID) | ORAL | 12 refills | Status: DC | PRN

## 2018-05-01 NOTE — Telephone Encounter (Signed)
We can change the Senokot and Miralax to PRN.  We can call facility with order or complete a form if needed

## 2018-05-01 NOTE — Telephone Encounter (Signed)
Spoke with pt daughter and advised of PCP recommendations. Pt daughter is still asking if any of these could be written for as needed so pt can ask for them. States that they have to use the facility for medications and pt cannot afford them.

## 2018-05-01 NOTE — Telephone Encounter (Signed)
The Vit D supplement is necessary for her osteoporosis and the B12 is due to her history of Vit B deficiency.  The other one listed IS a multivitamin but the 2 additional supplements are required  The bowel regimen of Senokot S and Miralax is due to her chronic pain medicine use to prevent constipation.  We can change these to as needed but constipation may again become an issue

## 2018-05-01 NOTE — Telephone Encounter (Signed)
Letter written and Rx's printed with prn on them. Will fax to facility.

## 2018-05-03 ENCOUNTER — Telehealth: Payer: Self-pay | Admitting: Family Medicine

## 2018-05-03 NOTE — Telephone Encounter (Signed)
I have placed an exam for housebound status from the [place] with a chargesheet in the bin upfront. Please call [name] once this is complete she would like to pick these forms up.

## 2018-05-03 NOTE — Telephone Encounter (Signed)
Form has been placed in providers box 

## 2018-05-10 NOTE — Telephone Encounter (Signed)
PCP has paperwork will complete as soon as she is able. PCP is unsure if she is able to complete all questions as it is dealing with some disability questions. Paperwork has a turn around time of 5-7 days.

## 2018-05-10 NOTE — Telephone Encounter (Signed)
Pt's daughter called in to get a status update on form. Confirmed with office that form is not up front. Daughter would like a call when ready for pick up.   CB: [phone]  [name]

## 2018-05-11 ENCOUNTER — Other Ambulatory Visit: Payer: Self-pay

## 2018-05-11 DIAGNOSIS — I75022 Atheroembolism of left lower extremity: Secondary | ICD-10-CM

## 2018-05-11 DIAGNOSIS — I779 Disorder of arteries and arterioles, unspecified: Secondary | ICD-10-CM

## 2018-05-11 DIAGNOSIS — Z0279 Encounter for issue of other medical certificate: Secondary | ICD-10-CM

## 2018-05-11 DIAGNOSIS — M7989 Other specified soft tissue disorders: Secondary | ICD-10-CM

## 2018-05-11 DIAGNOSIS — F17200 Nicotine dependence, unspecified, uncomplicated: Secondary | ICD-10-CM

## 2018-05-11 DIAGNOSIS — M79604 Pain in right leg: Secondary | ICD-10-CM

## 2018-05-11 NOTE — Telephone Encounter (Signed)
Form completed and placed in basket  

## 2018-05-11 NOTE — Telephone Encounter (Signed)
FYI

## 2018-05-11 NOTE — Telephone Encounter (Signed)
Daughter has pick up forms.

## 2018-05-11 NOTE — Telephone Encounter (Signed)
Pt daughter, [name], has been notified and will pick up form this afternoon.

## 2018-05-14 ENCOUNTER — Ambulatory Visit: Payer: Medicare Other | Admitting: Physician Assistant

## 2018-05-15 ENCOUNTER — Other Ambulatory Visit: Payer: Self-pay

## 2018-05-15 ENCOUNTER — Encounter: Payer: Self-pay | Admitting: Physician Assistant

## 2018-05-15 ENCOUNTER — Ambulatory Visit (INDEPENDENT_AMBULATORY_CARE_PROVIDER_SITE_OTHER): Payer: Medicare Other | Admitting: Physician Assistant

## 2018-05-15 VITALS — BP 130/72 | HR 74 | Temp 97.9°F | Resp 14 | Ht 63.0 in | Wt 95.0 lb

## 2018-05-15 DIAGNOSIS — R350 Frequency of micturition: Secondary | ICD-10-CM

## 2018-05-15 DIAGNOSIS — Z79891 Long term (current) use of opiate analgesic: Secondary | ICD-10-CM | POA: Diagnosis not present

## 2018-05-15 DIAGNOSIS — R0982 Postnasal drip: Secondary | ICD-10-CM | POA: Diagnosis not present

## 2018-05-15 DIAGNOSIS — J029 Acute pharyngitis, unspecified: Secondary | ICD-10-CM

## 2018-05-15 DIAGNOSIS — M4854XD Collapsed vertebra, not elsewhere classified, thoracic region, subsequent encounter for fracture with routine healing: Secondary | ICD-10-CM | POA: Diagnosis not present

## 2018-05-15 DIAGNOSIS — M546 Pain in thoracic spine: Secondary | ICD-10-CM | POA: Diagnosis not present

## 2018-05-15 DIAGNOSIS — G894 Chronic pain syndrome: Secondary | ICD-10-CM | POA: Diagnosis not present

## 2018-05-15 LAB — POCT URINALYSIS DIPSTICK
Bilirubin, UA: NEGATIVE
Blood, UA: NEGATIVE
Glucose, UA: NEGATIVE
Ketones, UA: NEGATIVE
Nitrite, UA: NEGATIVE
Protein, UA: NEGATIVE
Spec Grav, UA: 1.015 (ref 1.010–1.025)
Urobilinogen, UA: 0.2 E.U./dL
pH, UA: 6.5 (ref 5.0–8.0)

## 2018-05-15 MED ORDER — FLUTICASONE PROPIONATE 50 MCG/ACT NA SUSP: 2.0000 | Freq: Every day | NASAL | 0 refills | Status: DC

## 2018-05-15 NOTE — Patient Instructions (Signed)
Please keep hydrated and get plenty of rest. Start a saline nasal rinse and Flonase. Continue your regular medications as directed. Salt water gargles may also be beneficial.  Urine looks good but we will send for culture just to make sure!

## 2018-05-15 NOTE — Progress Notes (Signed)
Patient presents to clinic today c/o a couple of days of scratchy throat and neck soreness. She is accompanied by her daughter today. She denies any noted nasal congestion, cough, chest pain. Denies decreased ROM of neck. She is living in ALF so notes some sick friends. Denies recent travel. Feels somewhat better today but wanted to get things checked out. Has not taken anything for symptoms.   Patient also noting some mild urinary urgency. Denies dysuria, nausea or flank pain. Is on diuretics so she feels this could be the cause but wanted urine checked today to make sure.   Past Medical History:  Diagnosis Date  . Arthritis    DDD. Right shoulder"is frozen"-limited ROM. osteoporosis.  . Blue toe syndrome ([place]) 11/27/2014   Dr. Claudia Pollock evaluating  . Cancer ([place]) 12/2014   liver cancer  . Complication of anesthesia   . COPD (chronic obstructive pulmonary disease) ([place])   . Fibromyalgia   . Fracture of rib of right side    hx "osteoporosis"- states her dog nudge her on the side, next day developed great pain and was told has a fracture rib.  . Macular degeneration    R eye  . Osteoporosis   . PONV (postoperative nausea and vomiting)    nausea,severe vomiting after 01-13-15 portal vein embolization  . Productive cough   . Retina disorder    L eye, vision distorted, edema  . Spine fracture due to birth trauma   . TMJ disease   . Wears glasses     Current Outpatient Medications on File Prior to Visit  Medication Sig Dispense Refill  . acetaminophen (TYLENOL) 325 MG tablet Take 2 tablets (650 mg total) by mouth every 6 (six) hours as needed for mild pain (or Fever >/= 101).    Marland Kitchen ALPRAZolam (XANAX) 0.25 MG tablet Take 1 tablet (0.25 mg total) by mouth 2 (two) times daily as needed for anxiety. 30 tablet 1  . bisacodyl (DULCOLAX) 10 MG suppository Place 1 suppository (10 mg total) rectally daily as needed for moderate constipation. 12 suppository 0  . busPIRone (BUSPAR) 7.5 MG  tablet Take 1 tablet by mouth 2 (two) times daily.    . Calcium Carbonate-Vitamin D (CALCIUM 500+D HIGH POTENCY PO) Take 1 tablet by mouth daily as needed.     . Cholecalciferol (VITAMIN D3) 5000 units CAPS Take 5,000 Units by mouth daily as needed.     . DULoxetine (CYMBALTA) 30 MG capsule Take 1 tablet by mouth daily.    Marland Kitchen EVENITY 105 MG/1.17ML SOSY Inject 210 mg into the skin once. Take once a month on the 4th of the month    . feeding supplement, ENSURE ENLIVE, (ENSURE ENLIVE) LIQD Take 237 mLs by mouth 2 (two) times daily between meals. (Patient taking differently: Take 1 Bottle by mouth daily. ) 237 mL 12  . Glycopyrrolate-Formoterol (BEVESPI AEROSPHERE) 9-4.8 MCG/ACT AERO Inhale 2 puffs into the lungs 2 (two) times daily. 1 Inhaler 11  . guaiFENesin (MUCINEX) 600 MG 12 hr tablet Take 2 tablets (1,200 mg total) by mouth 2 (two) times daily as needed. 30 tablet 0  . HYDROcodone-acetaminophen (NORCO/VICODIN) 5-325 MG tablet Take 1 tablet by mouth 3 (three) times daily. (Patient taking differently: Take 1 tablet by mouth 3 (three) times daily as needed. ) 90 tablet 0  . ipratropium-albuterol (DUONEB) 0.5-2.5 (3) MG/3ML SOLN Take 3 mLs by nebulization every 4 (four) hours as needed (SOB).     . Multiple Vitamins-Minerals (CENTRUM SILVER ULTRA WOMENS  PO) Take 1 tablet by mouth daily.     . polyethylene glycol (MIRALAX / GLYCOLAX) packet Take 17 g by mouth daily as needed. 26 each 6  . promethazine (PHENERGAN) 12.5 MG tablet Take 12.5 mg by mouth every 6 (six) hours as needed for nausea or vomiting.    . senna-docusate (SENOKOT-S) 8.6-50 MG tablet Take 1 tablet by mouth 2 (two) times daily as needed for mild constipation. 60 tablet 12  . spironolactone (ALDACTONE) 50 MG tablet Take 1 tablet (50 mg total) by mouth daily. 30 tablet 3  . thiamine 100 MG tablet Take 1 tablet (100 mg total) by mouth daily. (Patient taking differently: Take 100 mg by mouth daily as needed. )    . vitamin B-12  (CYANOCOBALAMIN) 1000 MCG tablet Take 1,000 mcg by mouth daily as needed.      No current facility-administered medications on file prior to visit.     Allergies  Allergen Reactions  . Sertraline Hcl     Zoloft, had a terrible reaction  . Amoxicillin-Pot Clavulanate Nausea Only    Has patient had a PCN reaction causing immediate rash, facial/tongue/throat swelling, SOB or lightheadedness with hypotension: No Has patient had a PCN reaction causing severe rash involving mucus membranes or skin necrosis: No Has patient had a PCN reaction that required hospitalization No Has patient had a PCN reaction occurring within the last 10 years: No If all of the above answers are "NO", then may proceed with Cephalosporin use.   . Clindamycin/Lincomycin Other (See Comments)    Felt like it "burned out" her stomach/took a large dose  . Paroxetine Hcl Rash  . Sulfa Antibiotics Rash    Family History  Problem Relation Age of Onset  . Hypertension Mother   . Atrial fibrillation Mother   . Heart disease Mother        after age 57  . Stroke Father   . Dementia Father   . Heart disease Brother        After age 21- A-Fib  . Heart attack Brother     Social History   Socioeconomic History  . Marital status: Divorced    Spouse name: Not on file  . Number of children: Not on file  . Years of education: Not on file  . Highest education level: Not on file  Occupational History  . Not on file  Social Needs  . Financial resource strain: Not on file  . Food insecurity:    Worry: Not on file    Inability: Not on file  . Transportation needs:    Medical: Not on file    Non-medical: Not on file  Tobacco Use  . Smoking status: Former Smoker    Packs/day: 0.50    Years: 45.00    Pack years: 22.50    Types: Cigarettes    Last attempt to quit: 02/20/2018    Years since quitting: 0.2  . Smokeless tobacco: Never Used  Substance and Sexual Activity  . Alcohol use: No    Alcohol/week: 0.0  standard drinks  . Drug use: No  . Sexual activity: Not on file  Lifestyle  . Physical activity:    Days per week: Not on file    Minutes per session: Not on file  . Stress: Not on file  Relationships  . Social connections:    Talks on phone: Not on file    Gets together: Not on file    Attends religious service: Not on file  Active member of club or organization: Not on file    Attends meetings of clubs or organizations: Not on file    Relationship status: Not on file  Other Topics Concern  . Not on file  Social History Narrative  . Not on file   Review of Systems - See HPI.  All other ROS are negative.  BP 130/72   Pulse 74   Temp 97.9 F (36.6 C) (Oral)   Resp 14   Ht 5' 3"  (1.6 m)   Wt 95 lb (43.1 kg)   SpO2 96%   BMI 16.83 kg/m   Physical Exam Vitals signs reviewed.  Constitutional:      Appearance: Normal appearance.  HENT:     Head: Normocephalic and atraumatic.     Right Ear: Tympanic membrane normal.     Left Ear: Tympanic membrane normal.     Nose: Nose normal.     Mouth/Throat:     Mouth: Mucous membranes are moist.     Pharynx: Posterior oropharyngeal erythema (with noted cobblestoning) present.  Eyes:     Conjunctiva/sclera: Conjunctivae normal.  Neck:     Musculoskeletal: Neck supple.  Cardiovascular:     Rate and Rhythm: Normal rate and regular rhythm.     Heart sounds: Normal heart sounds.  Pulmonary:     Effort: Pulmonary effort is normal.     Breath sounds: Normal breath sounds.  Abdominal:     General: Bowel sounds are normal.     Palpations: Abdomen is soft.     Tenderness: There is no right CVA tenderness or left CVA tenderness.  Lymphadenopathy:     Cervical: No cervical adenopathy.  Neurological:     General: No focal deficit present.     Mental Status: She is alert and oriented to person, place, and time.  Psychiatric:        Mood and Affect: Mood normal.     Recent Results (from the past 2160 hour(s))  Comp Met (CMET)      Status: Abnormal   Collection Time: 02/27/18  3:45 PM  Result Value Ref Range   Sodium 130 (L) 135 - 145 mEq/L   Potassium 5.5 (H) 3.5 - 5.1 mEq/L   Chloride 91 (L) 96 - 112 mEq/L   CO2 36 (H) 19 - 32 mEq/L   Glucose, Bld 99 70 - 99 mg/dL   BUN 23 6 - 23 mg/dL   Creatinine, Ser 0.53 0.40 - 1.20 mg/dL   Total Bilirubin 0.4 0.2 - 1.2 mg/dL   Alkaline Phosphatase 138 (H) 39 - 117 U/L   AST 27 0 - 37 U/L   ALT 20 0 - 35 U/L   Total Protein 5.8 (L) 6.0 - 8.3 g/dL   Albumin 3.5 3.5 - 5.2 g/dL   Calcium 8.7 8.4 - 10.5 mg/dL   GFR 120.17 >60.00 mL/min  QuantiFERON-TB Gold Plus     Status: None   Collection Time: 02/27/18  3:45 PM  Result Value Ref Range   QuantiFERON-TB Gold Plus NEGATIVE NEGATIVE    Comment: Negative test result. M. tuberculosis complex  infection unlikely.    NIL 0.02 IU/mL   Mitogen-NIL 7.72 IU/mL   TB1-NIL 0.01 IU/mL   TB2-NIL 0.01 IU/mL    Comment: . The Nil tube value reflects the background interferon gamma immune response of the patient's blood sample. This value has been subtracted from the patient's displayed TB and Mitogen results. . Lower than expected results with the Mitogen tube prevent false-negative  Quantiferon readings by detecting a patient with a potential immune suppressive condition and/or suboptimal pre-analytical specimen handling. . The TB1 Antigen tube is coated with the M. tuberculosis-specific antigens designed to elicit responses from TB antigen primed CD4+ helper T-lymphocytes. . The TB2 Antigen tube is coated with the M. tuberculosis-specific antigens designed to elicit responses from TB antigen primed CD4+ helper and CD8+ cytotoxic T-lymphocytes. . For additional information, please refer to https://education.questdiagnostics.com/faq/FAQ204 (This link is being provided for informational/ educational purposes only.) .   B Nat Peptide     Status: Abnormal   Collection Time: 02/27/18  3:45 PM  Result Value Ref Range    Pro B Natriuretic peptide (BNP) 749.0 (H) 0.0 - 100.0 pg/mL  Lipase     Status: Abnormal   Collection Time: 02/27/18  3:45 PM  Result Value Ref Range   Lipase 6.0 (L) 11.0 - 59.0 U/L  Basic metabolic panel     Status: Abnormal   Collection Time: 02/28/18  3:27 PM  Result Value Ref Range   Sodium 129 (L) 135 - 145 mmol/L   Potassium 5.1 3.5 - 5.1 mmol/L   Chloride 88 (L) 98 - 111 mmol/L   CO2 32 22 - 32 mmol/L   Glucose, Bld 91 70 - 99 mg/dL   BUN 17 8 - 23 mg/dL   Creatinine, Ser 0.38 (L) 0.44 - 1.00 mg/dL   Calcium 8.0 (L) 8.9 - 10.3 mg/dL   GFR calc non Af Amer >60 >60 mL/min   GFR calc Af Amer >60 >60 mL/min   Anion gap 9 5 - 15    Comment: Performed at [facility], [place] [address] [place], [place] [address]  CBC     Status: Abnormal   Collection Time: 02/28/18  3:27 PM  Result Value Ref Range   WBC 8.0 4.0 - 10.5 K/uL   RBC 4.89 3.87 - 5.11 MIL/uL   Hemoglobin 14.4 12.0 - 15.0 g/dL   HCT 46.5 (H) 36.0 - 46.0 %   MCV 95.1 80.0 - 100.0 fL   MCH 29.4 26.0 - 34.0 pg   MCHC 31.0 30.0 - 36.0 g/dL   RDW 13.8 11.5 - 15.5 %   Platelets 221 150 - 400 K/uL   nRBC 0.0 0.0 - 0.2 %    Comment: Performed at [facility], [place] [address] [place], [place] [address]  Hepatic function panel     Status: Abnormal   Collection Time: 02/28/18  3:29 PM  Result Value Ref Range   Total Protein 5.6 (L) 6.5 - 8.1 g/dL   Albumin 3.2 (L) 3.5 - 5.0 g/dL   AST 28 15 - 41 U/L   ALT 25 0 - 44 U/L   Alkaline Phosphatase 113 38 - 126 U/L   Total Bilirubin 0.5 0.3 - 1.2 mg/dL   Bilirubin, Direct 0.2 0.0 - 0.2 mg/dL   Indirect Bilirubin 0.3 0.3 - 0.9 mg/dL    Comment: Performed at [facility], [place] [address] [place], [place] [address]  Urinalysis, Routine w reflex microscopic     Status: Abnormal   Collection Time: 02/28/18  3:29 PM  Result Value Ref Range   Color, Urine YELLOW YELLOW   APPearance HAZY (A) CLEAR   Specific Gravity, Urine  1.012 1.005 - 1.030   pH 7.0 5.0 - 8.0   Glucose, UA NEGATIVE NEGATIVE mg/dL   Hgb urine dipstick NEGATIVE NEGATIVE   Bilirubin Urine NEGATIVE NEGATIVE   Ketones, ur NEGATIVE NEGATIVE  mg/dL   Protein, ur NEGATIVE NEGATIVE mg/dL   Nitrite NEGATIVE NEGATIVE   Leukocytes, UA MODERATE (A) NEGATIVE   RBC / HPF 0-5 0 - 5 RBC/hpf   WBC, UA 11-20 0 - 5 WBC/hpf   Bacteria, UA FEW (A) NONE SEEN   Squamous Epithelial / LPF 6-10 0 - 5   Mucus PRESENT    Budding Yeast PRESENT    Amorphous Crystal PRESENT     Comment: Performed at [facility], [place] [address] [place], [place] [address]  Brain natriuretic peptide     Status: Abnormal   Collection Time: 02/28/18  3:29 PM  Result Value Ref Range   B Natriuretic Peptide 772.7 (H) 0.0 - 100.0 pg/mL    Comment: Performed at [facility], [place] [address] [place], [place] [address]  I-stat troponin, ED     Status: None   Collection Time: 02/28/18  3:48 PM  Result Value Ref Range   Troponin i, poc 0.02 0.00 - 0.08 ng/mL   Comment 3            Comment: Due to the release kinetics of cTnI, a negative result within the first hours of the onset of symptoms does not rule out myocardial infarction with certainty. If myocardial infarction is still suspected, repeat the test at appropriate intervals.   Urine Culture     Status: None   Collection Time: 02/28/18  8:54 PM  Result Value Ref Range   Specimen Description      URINE, CLEAN CATCH Performed at [facility], [place] [address] [place], [place] [address]    Special Requests      NONE Performed at [facility], [place] [address] [place], [place] [address]    Culture      NO GROWTH Performed at [facility], [place] [address], [place], [place] [address]    Report Status 03/02/2018 FINAL   Troponin I - Now Then Q6H     Status: Abnormal   Collection Time: 02/28/18  9:47 PM  Result Value Ref Range   Troponin I 0.06 (HH)  <0.03 ng/mL    Comment: CRITICAL RESULT CALLED TO, READ BACK BY AND VERIFIED WITH: KENAPP,D RN @2315  ON 02/28/18 JACKSON,K Performed at [facility], [place] [address], [place], [place] [address]   Troponin I - Now Then Q6H     Status: Abnormal   Collection Time: 03/01/18  2:44 AM  Result Value Ref Range   Troponin I 0.06 (HH) <0.03 ng/mL    Comment: CRITICAL VALUE NOTED.  VALUE IS CONSISTENT WITH PREVIOUSLY REPORTED AND CALLED VALUE. Performed at [facility], [place] [address], [place], [place] [address]   Troponin I - Now Then Q6H     Status: Abnormal   Collection Time: 03/01/18  6:01 AM  Result Value Ref Range   Troponin I 0.06 (HH) <0.03 ng/mL    Comment: CRITICAL VALUE NOTED.  VALUE IS CONSISTENT WITH PREVIOUSLY REPORTED AND CALLED VALUE. Performed at [facility], [place] [address], [place], [place] [address]   Magnesium     Status: None   Collection Time: 03/01/18  6:01 AM  Result Value Ref Range   Magnesium 2.1 1.7 - 2.4 mg/dL    Comment: Performed at [facility], [place] [address] [place], [place] [address]  Phosphorus     Status: None   Collection Time: 03/01/18  6:01 AM  Result Value Ref Range  Phosphorus 3.7 2.5 - 4.6 mg/dL    Comment: Performed at [facility], [place] [address] [place], [place] [address]  TSH     Status: None   Collection Time: 03/01/18  6:01 AM  Result Value Ref Range   TSH 1.262 0.350 - 4.500 uIU/mL    Comment: Performed by a 3rd Generation assay with a functional sensitivity of <=0.01 uIU/mL. Performed at [facility], [place] [address], [place], [place] [address]   Comprehensive metabolic panel     Status: Abnormal   Collection Time: 03/01/18  6:01 AM  Result Value Ref Range   Sodium 131 (L) 135 - 145 mmol/L   Potassium 4.3 3.5 - 5.1 mmol/L   Chloride 86 (L) 98 - 111 mmol/L   CO2 37 (H) 22 - 32 mmol/L   Glucose, Bld 100 (H) 70 - 99 mg/dL    BUN 13 8 - 23 mg/dL   Creatinine, Ser 0.46 0.44 - 1.00 mg/dL   Calcium 8.0 (L) 8.9 - 10.3 mg/dL   Total Protein 5.6 (L) 6.5 - 8.1 g/dL   Albumin 3.2 (L) 3.5 - 5.0 g/dL   AST 27 15 - 41 U/L   ALT 24 0 - 44 U/L   Alkaline Phosphatase 119 38 - 126 U/L   Total Bilirubin 0.6 0.3 - 1.2 mg/dL   GFR calc non Af Amer >60 >60 mL/min   GFR calc Af Amer >60 >60 mL/min   Anion gap 8 5 - 15    Comment: Performed at [facility], [place] [address], [place], [place] [address]  CBC     Status: Abnormal   Collection Time: 03/01/18  6:01 AM  Result Value Ref Range   WBC 5.6 4.0 - 10.5 K/uL   RBC 5.26 (H) 3.87 - 5.11 MIL/uL   Hemoglobin 15.1 (H) 12.0 - 15.0 g/dL   HCT 49.6 (H) 36.0 - 46.0 %   MCV 94.3 80.0 - 100.0 fL   MCH 28.7 26.0 - 34.0 pg   MCHC 30.4 30.0 - 36.0 g/dL   RDW 13.7 11.5 - 15.5 %   Platelets 229 150 - 400 K/uL   nRBC 0.0 0.0 - 0.2 %    Comment: Performed at [facility], [place] [address], [place], [place] [address]  Prealbumin     Status: Abnormal   Collection Time: 03/01/18  6:01 AM  Result Value Ref Range   Prealbumin 11.2 (L) 18 - 38 mg/dL    Comment: Performed at [facility], [place] [address] [place], [place] [address]  CBC with Differential/Platelet     Status: Abnormal   Collection Time: 03/02/18  5:53 AM  Result Value Ref Range   WBC 7.2 4.0 - 10.5 K/uL   RBC 5.00 3.87 - 5.11 MIL/uL   Hemoglobin 14.4 12.0 - 15.0 g/dL   HCT 47.2 (H) 36.0 - 46.0 %   MCV 94.4 80.0 - 100.0 fL   MCH 28.8 26.0 - 34.0 pg   MCHC 30.5 30.0 - 36.0 g/dL   RDW 13.7 11.5 - 15.5 %   Platelets 217 150 - 400 K/uL   nRBC 0.0 0.0 - 0.2 %   Neutrophils Relative % 79 %   Neutro Abs 5.6 1.7 - 7.7 K/uL   Lymphocytes Relative 11 %   Lymphs Abs 0.8 0.7 - 4.0 K/uL   Monocytes Relative 10 %   Monocytes Absolute 0.8 0.1 - 1.0 K/uL   Eosinophils Relative 0 %  Eosinophils Absolute 0.0 0.0 - 0.5 K/uL   Basophils Relative 0 %   Basophils Absolute 0.0 0.0  - 0.1 K/uL   Immature Granulocytes 0 %   Abs Immature Granulocytes 0.03 0.00 - 0.07 K/uL    Comment: Performed at [facility], [place] [address] [place], [place] [address]  Basic metabolic panel     Status: Abnormal   Collection Time: 03/02/18  5:53 AM  Result Value Ref Range   Sodium 133 (L) 135 - 145 mmol/L   Potassium 3.9 3.5 - 5.1 mmol/L   Chloride 87 (L) 98 - 111 mmol/L   CO2 38 (H) 22 - 32 mmol/L   Glucose, Bld 99 70 - 99 mg/dL   BUN 12 8 - 23 mg/dL   Creatinine, Ser 0.34 (L) 0.44 - 1.00 mg/dL   Calcium 7.7 (L) 8.9 - 10.3 mg/dL   GFR calc non Af Amer >60 >60 mL/min   GFR calc Af Amer >60 >60 mL/min   Anion gap 8 5 - 15    Comment: Performed at [facility], [place] [address] [place], [place] [address]  Magnesium     Status: None   Collection Time: 03/02/18  5:53 AM  Result Value Ref Range   Magnesium 2.0 1.7 - 2.4 mg/dL    Comment: Performed at [facility], [place] [address], [place], [name] [address]  ECHOCARDIOGRAM COMPLETE     Status: None   Collection Time: 03/02/18  2:56 PM  Result Value Ref Range   Weight 1,474.44 oz   Height 63 in   BP 141/68 mmHg  Basic metabolic panel     Status: Abnormal   Collection Time: 03/03/18  5:46 AM  Result Value Ref Range   Sodium 135 135 - 145 mmol/L   Potassium 3.6 3.5 - 5.1 mmol/L   Chloride 86 (L) 98 - 111 mmol/L   CO2 40 (H) 22 - 32 mmol/L   Glucose, Bld 96 70 - 99 mg/dL   BUN 13 8 - 23 mg/dL   Creatinine, Ser 0.43 (L) 0.44 - 1.00 mg/dL   Calcium 7.8 (L) 8.9 - 10.3 mg/dL   GFR calc non Af Amer >60 >60 mL/min   GFR calc Af Amer >60 >60 mL/min   Anion gap 9 5 - 15    Comment: Performed at [facility], [place] [address], [place], [place] [address]  Basic metabolic panel     Status: Abnormal   Collection Time: 03/04/18  6:57 AM  Result Value Ref Range   Sodium 135 135 - 145 mmol/L   Potassium 4.0 3.5 - 5.1 mmol/L   Chloride 83 (L) 98 - 111 mmol/L   CO2 41  (H) 22 - 32 mmol/L   Glucose, Bld 105 (H) 70 - 99 mg/dL   BUN 15 8 - 23 mg/dL   Creatinine, Ser 0.53 0.44 - 1.00 mg/dL   Calcium 8.3 (L) 8.9 - 10.3 mg/dL   GFR calc non Af Amer >60 >60 mL/min   GFR calc Af Amer >60 >60 mL/min   Anion gap 11 5 - 15    Comment: Performed at [facility], [place] [address], [place], [place] [address]  Magnesium     Status: None   Collection Time: 03/04/18  6:57 AM  Result Value Ref Range   Magnesium 2.2 1.7 - 2.4 mg/dL    Comment: Performed at [facility], [place] [address] [place], [place] [address]  Basic  metabolic panel     Status: Abnormal   Collection Time: 03/05/18  5:22 AM  Result Value Ref Range   Sodium 136 135 - 145 mmol/L   Potassium 4.4 3.5 - 5.1 mmol/L   Chloride 86 (L) 98 - 111 mmol/L   CO2 42 (H) 22 - 32 mmol/L   Glucose, Bld 100 (H) 70 - 99 mg/dL   BUN 25 (H) 8 - 23 mg/dL   Creatinine, Ser 0.38 (L) 0.44 - 1.00 mg/dL   Calcium 8.4 (L) 8.9 - 10.3 mg/dL   GFR calc non Af Amer >60 >60 mL/min   GFR calc Af Amer >60 >60 mL/min   Anion gap 8 5 - 15    Comment: Performed at [facility], [place] [address] [place], [place] [address]  Brain natriuretic peptide     Status: Abnormal   Collection Time: 03/05/18  5:22 AM  Result Value Ref Range   B Natriuretic Peptide 442.9 (H) 0.0 - 100.0 pg/mL    Comment: Performed at [facility], [place] [address] [place], [place] [address]  Basic metabolic panel     Status: Abnormal   Collection Time: 03/06/18  6:29 AM  Result Value Ref Range   Sodium 137 135 - 145 mmol/L   Potassium 4.2 3.5 - 5.1 mmol/L   Chloride 86 (L) 98 - 111 mmol/L   CO2 48 (H) 22 - 32 mmol/L   Glucose, Bld 103 (H) 70 - 99 mg/dL   BUN 23 8 - 23 mg/dL   Creatinine, Ser 0.46 0.44 - 1.00 mg/dL   Calcium 8.3 (L) 8.9 - 10.3 mg/dL   GFR calc non Af Amer >60 >60 mL/min   GFR calc Af Amer >60 >60 mL/min   Anion gap 3 (L) 5 - 15    Comment: Performed at [facility], [place] [address] [place], [place] [address]  CBC with Differential/Platelet     Status: Abnormal   Collection Time: 03/07/18  7:45 AM  Result Value Ref Range   WBC 6.9 4.0 - 10.5 K/uL   RBC 5.71 (H) 3.87 - 5.11 MIL/uL   Hemoglobin 16.1 (H) 12.0 - 15.0 g/dL   HCT 54.0 (H) 36.0 - 46.0 %   MCV 94.6 80.0 - 100.0 fL   MCH 28.2 26.0 - 34.0 pg   MCHC 29.8 (L) 30.0 - 36.0 g/dL   RDW 14.4 11.5 - 15.5 %   Platelets 226 150 - 400 K/uL   nRBC 0.0 0.0 - 0.2 %   Neutrophils Relative % 82 %   Neutro Abs 5.6 1.7 - 7.7 K/uL   Lymphocytes Relative 10 %   Lymphs Abs 0.7 0.7 - 4.0 K/uL   Monocytes Relative 8 %   Monocytes Absolute 0.6 0.1 - 1.0 K/uL   Eosinophils Relative 0 %   Eosinophils Absolute 0.0 0.0 - 0.5 K/uL   Basophils Relative 0 %   Basophils Absolute 0.0 0.0 - 0.1 K/uL   Immature Granulocytes 0 %   Abs Immature Granulocytes 0.01 0.00 - 0.07 K/uL    Comment: Performed at [facility], [address] [place], [place] [address]  Basic metabolic panel     Status: Abnormal   Collection Time: 03/07/18  7:45 AM  Result Value Ref Range   Sodium 138 135 - 145 mmol/L   Potassium 3.7 3.5 - 5.1 mmol/L   Chloride 86 (L) 98 - 111 mmol/L   CO2 42 (H) 22 - 32 mmol/L  Glucose, Bld 129 (H) 70 - 99 mg/dL   BUN 28 (H) 8 - 23 mg/dL   Creatinine, Ser 0.52 0.44 - 1.00 mg/dL   Calcium 9.0 8.9 - 10.3 mg/dL   GFR calc non Af Amer >60 >60 mL/min   GFR calc Af Amer >60 >60 mL/min   Anion gap 10 5 - 15    Comment: Performed at [facility], [address], [place], [place] [address]  Basic metabolic panel     Status: Abnormal   Collection Time: 04/02/18 12:08 PM  Result Value Ref Range   Sodium 136 135 - 145 mEq/L   Potassium 4.4 3.5 - 5.1 mEq/L   Chloride 96 96 - 112 mEq/L   CO2 36 (H) 19 - 32 mEq/L   Glucose, Bld 63 (L) 70 - 99 mg/dL   BUN 14 6 - 23 mg/dL   Creatinine, Ser 0.53 0.40 - 1.20 mg/dL   Calcium 8.6 8.4 - 10.5 mg/dL   GFR 120.18 >60.00 mL/min  CBC with  Differential/Platelet     Status: Abnormal   Collection Time: 04/02/18 12:08 PM  Result Value Ref Range   WBC 4.1 4.0 - 10.5 K/uL   RBC 4.73 3.87 - 5.11 Mil/uL   Hemoglobin 13.9 12.0 - 15.0 g/dL   HCT 42.7 36.0 - 46.0 %   MCV 90.5 78.0 - 100.0 fl   MCHC 32.5 30.0 - 36.0 g/dL   RDW 17.4 (H) 11.5 - 15.5 %   Platelets 184.0 150.0 - 400.0 K/uL   Neutrophils Relative % 49.9 43.0 - 77.0 %   Lymphocytes Relative 32.8 12.0 - 46.0 %   Monocytes Relative 16.1 (H) 3.0 - 12.0 %   Eosinophils Relative 0.5 0.0 - 5.0 %   Basophils Relative 0.7 0.0 - 3.0 %   Neutro Abs 2.0 1.4 - 7.7 K/uL   Lymphs Abs 1.3 0.7 - 4.0 K/uL   Monocytes Absolute 0.7 0.1 - 1.0 K/uL   Eosinophils Absolute 0.0 0.0 - 0.7 K/uL   Basophils Absolute 0.0 0.0 - 0.1 K/uL  QuantiFERON-TB Gold Plus     Status: None   Collection Time: 04/02/18 12:08 PM  Result Value Ref Range   QuantiFERON-TB Gold Plus NEGATIVE NEGATIVE    Comment: Negative test result. M. tuberculosis complex  infection unlikely.    NIL 0.04 IU/mL   Mitogen-NIL 7.96 IU/mL   TB1-NIL 0.00 IU/mL   TB2-NIL 0.00 IU/mL    Comment: . The Nil tube value reflects the background interferon gamma immune response of the patient's blood sample. This value has been subtracted from the patient's displayed TB and Mitogen results. . Lower than expected results with the Mitogen tube prevent false-negative Quantiferon readings by detecting a patient with a potential immune suppressive condition and/or suboptimal pre-analytical specimen handling. . The TB1 Antigen tube is coated with the M. tuberculosis-specific antigens designed to elicit responses from TB antigen primed CD4+ helper T-lymphocytes. . The TB2 Antigen tube is coated with the M. tuberculosis-specific antigens designed to elicit responses from TB antigen primed CD4+ helper and CD8+ cytotoxic T-lymphocytes. . For additional information, please refer  to https://education.questdiagnostics.com/faq/FAQ204 (This link is being provided for informational/ educational purposes only.) .   CBC with Differential     Status: Abnormal   Collection Time: 04/12/18 10:50 AM  Result Value Ref Range   WBC 7.9 4.0 - 10.5 K/uL   RBC 4.82 3.87 - 5.11 MIL/uL   Hemoglobin 13.9 12.0 - 15.0 g/dL   HCT 44.1 36.0 - 46.0 %  MCV 91.5 80.0 - 100.0 fL   MCH 28.8 26.0 - 34.0 pg   MCHC 31.5 30.0 - 36.0 g/dL   RDW 17.5 (H) 11.5 - 15.5 %   Platelets 254 150 - 400 K/uL   nRBC 0.0 0.0 - 0.2 %   Neutrophils Relative % 67 %   Neutro Abs 5.3 1.7 - 7.7 K/uL   Lymphocytes Relative 23 %   Lymphs Abs 1.8 0.7 - 4.0 K/uL   Monocytes Relative 10 %   Monocytes Absolute 0.8 0.1 - 1.0 K/uL   Eosinophils Relative 0 %   Eosinophils Absolute 0.0 0.0 - 0.5 K/uL   Basophils Relative 0 %   Basophils Absolute 0.0 0.0 - 0.1 K/uL   Immature Granulocytes 0 %   Abs Immature Granulocytes 0.02 0.00 - 0.07 K/uL    Comment: Performed at [facility], [place] [address], [place], [place] [address]  Comprehensive metabolic panel     Status: Abnormal   Collection Time: 04/12/18 10:50 AM  Result Value Ref Range   Sodium 134 (L) 135 - 145 mmol/L   Potassium 4.9 3.5 - 5.1 mmol/L   Chloride 96 (L) 98 - 111 mmol/L   CO2 31 22 - 32 mmol/L   Glucose, Bld 97 70 - 99 mg/dL   BUN 16 8 - 23 mg/dL   Creatinine, Ser 0.61 0.44 - 1.00 mg/dL   Calcium 9.5 8.9 - 10.3 mg/dL   Total Protein 6.8 6.5 - 8.1 g/dL   Albumin 3.6 3.5 - 5.0 g/dL   AST 14 (L) 15 - 41 U/L   ALT 12 0 - 44 U/L   Alkaline Phosphatase 131 (H) 38 - 126 U/L   Total Bilirubin 0.4 0.3 - 1.2 mg/dL   GFR calc non Af Amer >60 >60 mL/min   GFR calc Af Amer >60 >60 mL/min   Anion gap 7 5 - 15    Comment: Performed at [facility], [address] [place], [place] [address]  AFP tumor marker     Status: None   Collection Time: 04/12/18 10:50 AM  Result Value Ref Range   AFP, Serum,  Tumor Marker 4.3 0.0 - 8.3 ng/mL    Comment: (NOTE) Roche Diagnostics Electrochemiluminescence Immunoassay (ECLIA) Values obtained with different assay methods or kits cannot be used interchangeably.  Results cannot be interpreted as absolute evidence of the presence or absence of malignant disease. This test is not interpretable in pregnant females. Performed At: [facility] [place], [place] [id number] Rush Farmer MD BT:[id number]    Assessment/Plan: 1. Urinary frequency - POCT urinalysis dipstick - unremarkable - Urine Culture  2. Post-nasal drip 3. Viral pharyngitis Exam reveals mild posterior oropharyngeal erythema and cobblestoning from PND. Supportive measures and OTC medications reviewed. Start short course of Flonase. - fluticasone (FLONASE) 50 MCG/ACT nasal spray; Place 2 sprays into both nostrils daily.  Dispense: 16 g; Refill: 0   Leeanne Rio, PA-C

## 2018-05-16 LAB — URINE CULTURE
MICRO NUMBER:: 239684
Result:: NO GROWTH
SPECIMEN QUALITY:: ADEQUATE

## 2018-05-17 ENCOUNTER — Ambulatory Visit (HOSPITAL_COMMUNITY)
Admission: RE | Admit: 2018-05-17 | Discharge: 2018-05-17 | Disposition: A | Discharge status: Home / Self Care | Payer: Medicare Other | Source: Ambulatory Visit | Attending: Family | Admitting: Family

## 2018-05-17 ENCOUNTER — Encounter: Payer: Self-pay | Admitting: Vascular Surgery

## 2018-05-17 ENCOUNTER — Other Ambulatory Visit: Payer: Self-pay

## 2018-05-17 ENCOUNTER — Ambulatory Visit (INDEPENDENT_AMBULATORY_CARE_PROVIDER_SITE_OTHER): Payer: Medicare Other | Admitting: Vascular Surgery

## 2018-05-17 ENCOUNTER — Ambulatory Visit (INDEPENDENT_AMBULATORY_CARE_PROVIDER_SITE_OTHER)
Admission: RE | Admit: 2018-05-17 | Discharge: 2018-05-17 | Disposition: A | Discharge status: Home / Self Care | Payer: Medicare Other | Source: Ambulatory Visit | Attending: Family | Admitting: Family

## 2018-05-17 VITALS — BP 119/68 | HR 100 | Temp 97.4°F | Resp 20 | Ht 66.0 in | Wt 95.5 lb

## 2018-05-17 DIAGNOSIS — I739 Peripheral vascular disease, unspecified: Secondary | ICD-10-CM

## 2018-05-17 DIAGNOSIS — M79604 Pain in right leg: Secondary | ICD-10-CM | POA: Diagnosis not present

## 2018-05-17 DIAGNOSIS — M7989 Other specified soft tissue disorders: Secondary | ICD-10-CM | POA: Diagnosis present

## 2018-05-17 DIAGNOSIS — I75022 Atheroembolism of left lower extremity: Secondary | ICD-10-CM | POA: Diagnosis present

## 2018-05-17 DIAGNOSIS — I779 Disorder of arteries and arterioles, unspecified: Secondary | ICD-10-CM | POA: Diagnosis not present

## 2018-05-17 DIAGNOSIS — F17200 Nicotine dependence, unspecified, uncomplicated: Secondary | ICD-10-CM

## 2018-05-17 NOTE — Progress Notes (Signed)
Patient is a 73 year old female seen in follow-up today.  She initially presented 2016 with bluish discoloration of the left fifth toe.  She has not had any further embolic events.  She did quit smoking recently.  She does not really describe lower extremity claudication symptoms.  She has no rest pain.  She has no nonhealing wounds.  Past Medical History:  Diagnosis Date  . Arthritis    DDD. Right shoulder"is frozen"-limited ROM. osteoporosis.  . 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 Claudia Pollock evaluating  . Cancer ([place]) 12/2014   liver cancer  . Complication of anesthesia   . COPD (chronic obstructive pulmonary disease) ([place])   . Fibromyalgia   . Fracture of rib of right side    hx "osteoporosis"- states her dog nudge her on the side, next day developed great pain and was told has a fracture rib.  . Macular degeneration    R eye  . Osteoporosis   . PONV (postoperative nausea and vomiting)    nausea,severe vomiting after 01-13-15 portal vein embolization  . Productive cough   . Retina disorder    L eye, vision distorted, edema  . Spine fracture due to birth trauma   . TMJ disease   . Wears glasses     Past Surgical History:  Procedure Laterality Date  . ANGIOPLASTY  2008   no stents required, no follow-up with cardiologist, no recurrent chest pain  . CATARACT EXTRACTION, BILATERAL Bilateral   . ESOPHAGOGASTRODUODENOSCOPY (EGD) WITH PROPOFOL N/A 06/25/2015   Procedure: ESOPHAGOGASTRODUODENOSCOPY (EGD) WITH PROPOFOL;  Surgeon: Milus Banister, MD;  Location: [facility];  Service: Endoscopy;  Laterality: N/A;  stent removal   . EYE SURGERY     cornea surgery  . INTRAMEDULLARY (IM) NAIL INTERTROCHANTERIC Right 12/04/2017   Procedure: INTRAMEDULLARY (IM) NAIL INTERTROCHANTRIC;  Surgeon: Rod Can, MD;  Location: [facility];  Service: Orthopedics;  Laterality: Right;  . IR RADIOLOGIST EVAL & MGMT  03/29/2017  . KIDNEY STONE SURGERY    . LAPAROSCOPIC PARTIAL HEPATECTOMY N/A  03/04/2015   Procedure: DIAGNOSTIC LAPAROSCOPY, EXTENDED RIGHT HEPATECTOMY, WITH INTRAOPERATIVE ULTRASOUND;  Surgeon: Stark Klein, MD;  Location: [facility];  Service: General;  Laterality: N/A;  . PARTIAL HYSTERECTOMY    . portal vein embolization     01-13-15 -Dr. Cathlean Sauer.    Current Outpatient Medications on File Prior to Visit  Medication Sig Dispense Refill  . acetaminophen (TYLENOL) 325 MG tablet Take 2 tablets (650 mg total) by mouth every 6 (six) hours as needed for mild pain (or Fever >/= 101).    Marland Kitchen ALPRAZolam (XANAX) 0.25 MG tablet Take 1 tablet (0.25 mg total) by mouth 2 (two) times daily as needed for anxiety. 30 tablet 1  . bisacodyl (DULCOLAX) 10 MG suppository Place 1 suppository (10 mg total) rectally daily as needed for moderate constipation. 12 suppository 0  . busPIRone (BUSPAR) 7.5 MG tablet Take 1 tablet by mouth 2 (two) times daily.    . Calcium Carbonate-Vitamin D (CALCIUM 500+D HIGH POTENCY PO) Take 1 tablet by mouth daily as needed.     . Cholecalciferol (VITAMIN D3) 5000 units CAPS Take 5,000 Units by mouth daily as needed.     . DULoxetine (CYMBALTA) 30 MG capsule Take 1 tablet by mouth daily.    Marland Kitchen EVENITY 105 MG/1.17ML SOSY Inject 210 mg into the skin once. Take once a month on the 4th of the month    . feeding supplement, ENSURE ENLIVE, (ENSURE ENLIVE) LIQD Take  237 mLs by mouth 2 (two) times daily between meals. (Patient taking differently: Take 1 Bottle by mouth daily. ) 237 mL 12  . fluticasone (FLONASE) 50 MCG/ACT nasal spray Place 2 sprays into both nostrils daily. 16 g 0  . fluticasone (FLONASE) 50 MCG/ACT nasal spray Flonase    . Glycopyrrolate-Formoterol (BEVESPI AEROSPHERE) 9-4.8 MCG/ACT AERO Inhale 2 puffs into the lungs 2 (two) times daily. 1 Inhaler 11  . guaiFENesin (MUCINEX) 600 MG 12 hr tablet Take 2 tablets (1,200 mg total) by mouth 2 (two) times daily as needed. 30 tablet 0  . HYDROcodone-acetaminophen (NORCO/VICODIN) 5-325 MG tablet Take 1  tablet by mouth 3 (three) times daily. (Patient taking differently: Take 1 tablet by mouth 3 (three) times daily as needed. ) 90 tablet 0  . ipratropium-albuterol (DUONEB) 0.5-2.5 (3) MG/3ML SOLN Take 3 mLs by nebulization every 4 (four) hours as needed (SOB).     . Multiple Vitamins-Minerals (CENTRUM SILVER ULTRA WOMENS PO) Take 1 tablet by mouth daily.     . polyethylene glycol (MIRALAX / GLYCOLAX) packet Take 17 g by mouth daily as needed. 26 each 6  . promethazine (PHENERGAN) 12.5 MG tablet Take 12.5 mg by mouth every 6 (six) hours as needed for nausea or vomiting.    . senna-docusate (SENOKOT-S) 8.6-50 MG tablet Take 1 tablet by mouth 2 (two) times daily as needed for mild constipation. 60 tablet 12  . spironolactone (ALDACTONE) 50 MG tablet Take 1 tablet (50 mg total) by mouth daily. 30 tablet 3  . thiamine 100 MG tablet Take 1 tablet (100 mg total) by mouth daily. (Patient taking differently: Take 100 mg by mouth daily as needed. )    . vitamin B-12 (CYANOCOBALAMIN) 1000 MCG tablet Take 1,000 mcg by mouth daily as needed.      No current facility-administered medications on file prior to visit.     Review of systems: She does have occasional swelling in her lower extremities.  She still has some pain in her right hip from her recent fracture.  Physical exam:  Vitals:   05/17/18 1524  BP: 119/68  Pulse: 100  Resp: 20  Temp: (!) 97.4 F (36.3 C)  SpO2: 96%  Weight: 95 lb 7.4 oz (43.3 kg)  Height: 5\' 6"  (1.676 m)    Neck: No carotid bruits  Chest: Clear to auscultation bilaterally  Cardiac: Regular rate and rhythm  Abdomen: Soft nontender  Extremities: 2+ femoral pulses absent popliteal and pedal pulses bilaterally  Data: Patient had bilateral ABIs performed today which were 0.6 on the right side 0.8 on the left side  Assessment: Peripheral arterial disease bilateral lower extremities no further atherotic embolic events over the last 4 years.  Although she has most likely  superficial femoral artery occlusive disease bilaterally she really has no claudication symptoms and no evidence of ischemia.  Plan: Patient will follow-up in 1 year with repeat ABIs.  Ruta Hinds, MD [facility] Office: [phone] Pager: [phone]

## 2018-05-21 ENCOUNTER — Telehealth: Payer: Self-pay | Admitting: General Practice

## 2018-05-21 MED ORDER — HYDROCODONE-ACETAMINOPHEN 5-325 MG PO TABS: 1.0000 | ORAL_TABLET | Freq: Three times a day (TID) | ORAL | 0 refills | Status: DC | PRN

## 2018-05-21 NOTE — Telephone Encounter (Signed)
Prescription sent to pharmacy for pt

## 2018-05-21 NOTE — Telephone Encounter (Signed)
Noted. Called and left a message at facility

## 2018-05-21 NOTE — Telephone Encounter (Signed)
Received a [employer] message from [facility] that pt will be out of pain medication today.   Please advise on refill?

## 2018-05-22 ENCOUNTER — Telehealth: Payer: Self-pay | Admitting: Family Medicine

## 2018-05-22 NOTE — Telephone Encounter (Signed)
Reviewed Ortho note from 2/25 that indicates they are prescribing pt's Norco.  They wrote script from #120 but at time of med refill request yesterday, this was not documented in the controlled substance database and I gave pt #90.  According to database today, neither script has been dispensed.  Will have CMA call pharmacy to clarify/cancel prescription if needed.

## 2018-05-22 NOTE — Telephone Encounter (Signed)
Clarified with pharmacy that the only active prescription available to patient is prescribed by Dr. Birdie Riddle.

## 2018-05-23 NOTE — Telephone Encounter (Signed)
Spoke with pharmacy and canceled our order for medication.  Spoke with daughter to explain things to her, stated verbal understanding and said that the facility messed up the request, so she was fully aware that we will not be prescribing this medication.

## 2018-05-23 NOTE — Telephone Encounter (Signed)
Actually, if Ortho has her taking opiates scheduled 4x/day- it needs to be their script that's active.  Please call pharmacy to clarify, cancel my prescription, and let family know that I am not filling this

## 2018-05-24 ENCOUNTER — Encounter (HOSPITAL_COMMUNITY): Payer: Medicare Other

## 2018-05-24 ENCOUNTER — Ambulatory Visit: Payer: Medicare Other | Admitting: Vascular Surgery

## 2018-05-25 DIAGNOSIS — I10 Essential (primary) hypertension: Secondary | ICD-10-CM | POA: Diagnosis not present

## 2018-05-25 DIAGNOSIS — Z5181 Encounter for therapeutic drug level monitoring: Secondary | ICD-10-CM | POA: Diagnosis not present

## 2018-05-29 DIAGNOSIS — S72141D Displaced intertrochanteric fracture of right femur, subsequent encounter for closed fracture with routine healing: Secondary | ICD-10-CM | POA: Diagnosis not present

## 2018-05-30 DIAGNOSIS — M810 Age-related osteoporosis without current pathological fracture: Secondary | ICD-10-CM | POA: Diagnosis not present

## 2018-06-14 ENCOUNTER — Encounter (HOSPITAL_COMMUNITY): Payer: Medicare Other

## 2018-06-14 ENCOUNTER — Ambulatory Visit: Payer: Medicare Other | Admitting: Vascular Surgery

## 2018-06-27 ENCOUNTER — Other Ambulatory Visit: Payer: Self-pay

## 2018-06-27 ENCOUNTER — Telehealth: Payer: Self-pay | Admitting: Family Medicine

## 2018-06-27 MED ORDER — ALPRAZOLAM 0.25 MG PO TABS: 0.2500 mg | ORAL_TABLET | Freq: Three times a day (TID) | ORAL | 1 refills | Status: DC | PRN

## 2018-06-27 NOTE — Telephone Encounter (Signed)
Refill request has been sent to PCP. 

## 2018-06-27 NOTE — Telephone Encounter (Signed)
Last refill:05/21/18 #90, 0 Last OV: 04/16/18

## 2018-06-27 NOTE — Telephone Encounter (Signed)
Copied from [facility] [place] [phone]. Topic: Quick Communication - Rx Refill/Question >> Jun 27, 2018  3:03 PM Tina Bailey, [place] A wrote: Medication: HYDROcodone-acetaminophen (NORCO/VICODIN) 5-325 MG tablet (Patient called and stated that she was given 10mg  of [place] by accident instead of what was prescribed.  Has the patient contacted their pharmacy? Yes (Agent: If no, request that the patient contact the pharmacy for the refill.) (Agent: If yes, when and what did the pharmacy advise?)Contact PCP  Preferred Pharmacy (with phone number or street name): [place], [place] - [address] [phone] (Phone) [phone] (Fax)    Agent: Please be advised that RX refills may take up to 3 business days. We ask that you follow-up with your pharmacy.

## 2018-09-04 DIAGNOSIS — S72141D Displaced intertrochanteric fracture of right femur, subsequent encounter for closed fracture with routine healing: Secondary | ICD-10-CM | POA: Diagnosis not present

## 2018-09-14 ENCOUNTER — Other Ambulatory Visit: Payer: Self-pay | Admitting: General Practice

## 2018-09-14 MED ORDER — HYDROCODONE-ACETAMINOPHEN 5-325 MG PO TABS: 1.0000 | ORAL_TABLET | Freq: Three times a day (TID) | ORAL | 0 refills | Status: DC | PRN

## 2018-09-24 ENCOUNTER — Telehealth: Payer: Self-pay | Admitting: Hematology

## 2018-09-24 NOTE — Telephone Encounter (Signed)
Called pt per 7/05 sch message - pt wants to keep as is for now but will call back after talking to her daughter about rescheduling 7/23

## 2018-09-26 ENCOUNTER — Encounter: Payer: Self-pay | Admitting: Family Medicine

## 2018-09-26 ENCOUNTER — Other Ambulatory Visit: Payer: Self-pay

## 2018-09-26 ENCOUNTER — Ambulatory Visit (INDEPENDENT_AMBULATORY_CARE_PROVIDER_SITE_OTHER): Payer: Medicare Other | Admitting: Family Medicine

## 2018-09-26 VITALS — BP 141/86 | HR 92 | Temp 98.1°F | Wt 107.4 lb

## 2018-09-26 DIAGNOSIS — R4589 Other symptoms and signs involving emotional state: Secondary | ICD-10-CM

## 2018-09-26 DIAGNOSIS — F418 Other specified anxiety disorders: Secondary | ICD-10-CM | POA: Diagnosis not present

## 2018-09-26 DIAGNOSIS — F424 Excoriation (skin-picking) disorder: Secondary | ICD-10-CM

## 2018-09-26 MED ORDER — MUPIROCIN 2 % EX OINT: 1.0000 "application " | TOPICAL_OINTMENT | Freq: Two times a day (BID) | CUTANEOUS | 1 refills | Status: DC

## 2018-09-26 NOTE — Progress Notes (Signed)
Virtual Visit via Video   I connected with patient on 09/26/18 at 10:00 AM EDT by a video enabled telemedicine application and verified that I am speaking with the correct person using two identifiers.  Location patient: Home Location provider: Acupuncturist, Office Persons participating in the virtual visit: Patient, Provider, [place] (Jess B)  I discussed the limitations of evaluation and management by telemedicine and the availability of in person appointments. The patient expressed understanding and agreed to proceed.  Subjective:   HPI:   Sweating- pt actually did not mention this during her appt (although this was why visit was scheduled)  Anxiety- pt is very anxious today and very difficult to follow her train of thought.  She is speaking to me from the nurses station at her rehab facility but they are not assisting her in our visit.  The use of technology is making her anxious, COVID is making her anxious.  She has psych and they are managing her meds.  She has a lot of vague complaints and 'just want to be checked out'.  I asked repeatedly what was bothering her and she kept saying 'I want to be checked out'.  Skin picking- pt reports she will start w/ a small area that itches and then she will pick and area will bleed and scab.  Areas will then ooze.  Has applied Cortisone w/o relief.  Pt has been working w/ psych on this.  ROS:   See pertinent positives and negatives per HPI.  Patient Active Problem List   Diagnosis Date Noted  . Thoracic aortic aneurysm without rupture ([place]) 04/13/2018  . Pulmonary hypertension, unspecified ([place]) 04/13/2018  . Bilateral leg edema 04/13/2018  . Severe episode of recurrent major depressive disorder, without psychotic features ([place]) 04/02/2018  . Acute on chronic diastolic CHF (congestive heart failure) ([place])   . Hyponatremia 02/28/2018  . COPD (chronic obstructive pulmonary disease) ([place]) 02/28/2018  . Tobacco abuse 02/28/2018  .  Acute blood loss anemia 12/06/2017  . Thrombocytopenia ([place]) 12/06/2017  . Severe protein-calorie malnutrition ([place]) 12/05/2017  . Closed comminuted intertrochanteric fracture of proximal end of right femur ([place]) 12/04/2017  . Chronic thoracic back pain 03/31/2017  . Physical exam 01/09/2017  . Malnutrition of moderate degree 12/22/2016  . Anxiety about health 09/27/2016  . Osteoporosis 07/13/2016  . Chronic narcotic use 07/13/2016  . Constipation 04/26/2015  . Chronic pain 04/26/2015  . Depression with anxiety 04/26/2015  . COPD GOLD IV / still somking 03/03/2015  . Cigarette smoker 03/03/2015  . Hepatocellular carcinoma ([place]) 12/30/2014  . Pancreatic mass 12/04/2014  . Blue toe syndrome ([place]) 11/27/2014    Social History   Tobacco Use  . Smoking status: Former Smoker    Packs/day: 0.50    Years: 45.00    Pack years: 22.50    Types: Cigarettes    Quit date: 02/20/2018    Years since quitting: 0.5  . Smokeless tobacco: Never Used  Substance Use Topics  . Alcohol use: No    Alcohol/week: 0.0 standard drinks    Current Outpatient Medications:  .  acetaminophen (TYLENOL) 325 MG tablet, Take 2 tablets (650 mg total) by mouth every 6 (six) hours as needed for mild pain (or Fever >/= 101)., Disp: , Rfl:  .  ALPRAZolam (XANAX) 0.25 MG tablet, Take 1 tablet (0.25 mg total) by mouth 3 (three) times daily as needed for anxiety., Disp: 90 tablet, Rfl: 1 .  bisacodyl (DULCOLAX) 10 MG suppository, Place 1 suppository (10 mg  total) rectally daily as needed for moderate constipation., Disp: 12 suppository, Rfl: 0 .  busPIRone (BUSPAR) 10 MG tablet, , Disp: , Rfl:  .  Calcium Carbonate-Vitamin D (CALCIUM 500+D HIGH POTENCY PO), Take 1 tablet by mouth daily as needed. , Disp: , Rfl:  .  Cholecalciferol (VITAMIN D3) 5000 units CAPS, Take 5,000 Units by mouth daily as needed. , Disp: , Rfl:  .  DULoxetine (CYMBALTA) 30 MG capsule, Take 1 tablet by mouth daily., Disp: , Rfl:  .  feeding  supplement, ENSURE ENLIVE, (ENSURE ENLIVE) LIQD, Take 237 mLs by mouth 2 (two) times daily between meals. (Patient taking differently: Take 1 Bottle by mouth daily. ), Disp: 237 mL, Rfl: 12 .  fluticasone (FLONASE) 50 MCG/ACT nasal spray, Place 2 sprays into both nostrils daily., Disp: 16 g, Rfl: 0 .  fluticasone (FLONASE) 50 MCG/ACT nasal spray, Flonase, Disp: , Rfl:  .  Glycopyrrolate-Formoterol (BEVESPI AEROSPHERE) 9-4.8 MCG/ACT AERO, Inhale 2 puffs into the lungs 2 (two) times daily., Disp: 1 Inhaler, Rfl: 11 .  guaiFENesin (MUCINEX) 600 MG 12 hr tablet, Take 2 tablets (1,200 mg total) by mouth 2 (two) times daily as needed., Disp: 30 tablet, Rfl: 0 .  HYDROcodone-acetaminophen (NORCO/VICODIN) 5-325 MG tablet, Take 1 tablet by mouth 3 (three) times daily as needed., Disp: 90 tablet, Rfl: 0 .  ipratropium-albuterol (DUONEB) 0.5-2.5 (3) MG/3ML SOLN, Take 3 mLs by nebulization every 4 (four) hours as needed (SOB). , Disp: , Rfl:  .  Multiple Vitamins-Minerals (CENTRUM SILVER ULTRA WOMENS PO), Take 1 tablet by mouth daily. , Disp: , Rfl:  .  polyethylene glycol (MIRALAX / GLYCOLAX) packet, Take 17 g by mouth daily as needed., Disp: 26 each, Rfl: 6 .  promethazine (PHENERGAN) 12.5 MG tablet, Take 12.5 mg by mouth every 6 (six) hours as needed for nausea or vomiting., Disp: , Rfl:  .  senna-docusate (SENOKOT-S) 8.6-50 MG tablet, Take 1 tablet by mouth 2 (two) times daily as needed for mild constipation., Disp: 60 tablet, Rfl: 12 .  spironolactone (ALDACTONE) 50 MG tablet, Take 1 tablet (50 mg total) by mouth daily., Disp: 30 tablet, Rfl: 3 .  thiamine 100 MG tablet, Take 1 tablet (100 mg total) by mouth daily. (Patient taking differently: Take 100 mg by mouth daily as needed. ), Disp: , Rfl:  .  vitamin B-12 (CYANOCOBALAMIN) 1000 MCG tablet, Take 1,000 mcg by mouth daily as needed. , Disp: , Rfl:  .  EVENITY 105 MG/1.17ML SOSY, Inject 210 mg into the skin once. Take once a month on the 4th of the month,  Disp: , Rfl:   Allergies  Allergen Reactions  . Sertraline Hcl     Zoloft, had a terrible reaction  . Amoxicillin-Pot Clavulanate Nausea Only    Has patient had a PCN reaction causing immediate rash, facial/tongue/throat swelling, SOB or lightheadedness with hypotension: No Has patient had a PCN reaction causing severe rash involving mucus membranes or skin necrosis: No Has patient had a PCN reaction that required hospitalization No Has patient had a PCN reaction occurring within the last 10 years: No If all of the above answers are "NO", then may proceed with Cephalosporin use.   . Clindamycin/Lincomycin Other (See Comments)    Felt like it "burned out" her stomach/took a large dose  . Paroxetine Hcl Rash  . Sulfa Antibiotics Rash    Objective:   BP (!) 141/86   Pulse 92   Temp 98.1 F (36.7 C) (Oral)   Wt 107  lb 6 oz (48.7 kg) Comment: per nurse not fluid/from food  BMI 17.33 kg/m   Tearful, anxious Frail NCAT, EOMI No obvious CN deficits Coloring WNL Pt is able to speak clearly without shortness of breath or increased work of breathing.  Thought process is tangential.  Mood is labile and anxious   Assessment and Plan:   Skin picking- restart mupirocin to open areas to prevent infection  Anxiety- severe.  She is seeing psych and they are managing her meds.  She doesn't understand the technology to do these visits and wants someone to physically assess her.  Discussed at length with her that the facility has onsite care available and that this is an option for her.  Stressed that during this time of COVID, this would be the safest option for her and strongly encouraged her to make this change- even if it's temporary.  After a very long discussion with both her and the available nurse, this change will be made effective immediately.  Told her that this will allow her to have hands on evaluations and care can be done in realtime without using virtual visits.  This seemed to  ease some of her very high anxiety.  Total time spent w/ pt 35 minutes, > 50% spent counseling on best way to proceed with her care going forward.   Annye Asa, MD 09/26/2018

## 2018-09-26 NOTE — Progress Notes (Signed)
I have discussed the procedure for the virtual visit with the patient who has given consent to proceed with assessment and treatment.   Tina Bailey L Tina Bailey, CMA     

## 2018-10-01 ENCOUNTER — Telehealth: Payer: Self-pay | Admitting: Family Medicine

## 2018-10-01 NOTE — Telephone Encounter (Signed)
Rec'd call from pt.  Reported she had a Virtual Visit with Dr. Birdie Riddle 7/8, and it was discussed that she could have the doctor that visits the [facility], where she resides, evaluate her for any medical concerns, since office visits are limited, during the [place].  The pt. stated she called her [employer], and was advised that her PCP should send a note to the Facility, authorizing this.  The pt. referred to this as a "pre-authorization".  [facility] phone [phone]; fax # is [phone].  The pt. stated the contact person at [facility] is "Tonya".  Advised will send message to Dr. Birdie Riddle, per her request.  Agreed with plan.

## 2018-10-01 NOTE — Telephone Encounter (Signed)
Letter written and faxed.

## 2018-10-01 NOTE — Telephone Encounter (Signed)
Please provide note indicating that I feel it is in Ms Petitfrere's best interest to be seen by the onsite physician due to [place] restrictions.

## 2018-10-02 ENCOUNTER — Encounter: Payer: Medicare Other | Admitting: Family Medicine

## 2018-10-03 ENCOUNTER — Telehealth: Payer: Self-pay

## 2018-10-03 NOTE — Telephone Encounter (Signed)
Spoke with patient regarding her appointments on 7/23, per Cira Rue NP okay to move it out a couple of months, patient verbalized an understanding and denies any current problems or concerns.  A scheduling message was sent to cancel appointments on 7/23 and reschedule a couple of months out.

## 2018-10-04 DIAGNOSIS — M810 Age-related osteoporosis without current pathological fracture: Secondary | ICD-10-CM | POA: Diagnosis not present

## 2018-10-04 DIAGNOSIS — J449 Chronic obstructive pulmonary disease, unspecified: Secondary | ICD-10-CM | POA: Diagnosis not present

## 2018-10-04 DIAGNOSIS — C220 Liver cell carcinoma: Secondary | ICD-10-CM | POA: Diagnosis not present

## 2018-10-04 DIAGNOSIS — I5032 Chronic diastolic (congestive) heart failure: Secondary | ICD-10-CM | POA: Diagnosis not present

## 2018-10-11 ENCOUNTER — Ambulatory Visit: Payer: Medicare Other | Admitting: Nurse Practitioner

## 2018-10-11 ENCOUNTER — Other Ambulatory Visit: Payer: Medicare Other

## 2018-10-15 DIAGNOSIS — J449 Chronic obstructive pulmonary disease, unspecified: Secondary | ICD-10-CM | POA: Diagnosis not present

## 2018-10-15 DIAGNOSIS — C220 Liver cell carcinoma: Secondary | ICD-10-CM | POA: Diagnosis not present

## 2018-10-15 DIAGNOSIS — I5032 Chronic diastolic (congestive) heart failure: Secondary | ICD-10-CM | POA: Diagnosis not present

## 2018-10-15 DIAGNOSIS — Z79899 Other long term (current) drug therapy: Secondary | ICD-10-CM | POA: Diagnosis not present

## 2018-10-18 DIAGNOSIS — C220 Liver cell carcinoma: Secondary | ICD-10-CM | POA: Diagnosis not present

## 2018-10-18 DIAGNOSIS — J449 Chronic obstructive pulmonary disease, unspecified: Secondary | ICD-10-CM | POA: Diagnosis not present

## 2018-10-18 DIAGNOSIS — I5032 Chronic diastolic (congestive) heart failure: Secondary | ICD-10-CM | POA: Diagnosis not present

## 2018-10-18 DIAGNOSIS — R6884 Jaw pain: Secondary | ICD-10-CM | POA: Diagnosis not present

## 2018-11-01 DIAGNOSIS — I5032 Chronic diastolic (congestive) heart failure: Secondary | ICD-10-CM | POA: Diagnosis not present

## 2018-11-01 DIAGNOSIS — J449 Chronic obstructive pulmonary disease, unspecified: Secondary | ICD-10-CM | POA: Diagnosis not present

## 2018-11-01 DIAGNOSIS — R6884 Jaw pain: Secondary | ICD-10-CM | POA: Diagnosis not present

## 2018-11-01 DIAGNOSIS — R7309 Other abnormal glucose: Secondary | ICD-10-CM | POA: Diagnosis not present

## 2018-11-01 DIAGNOSIS — C220 Liver cell carcinoma: Secondary | ICD-10-CM | POA: Diagnosis not present

## 2018-11-29 DIAGNOSIS — R7309 Other abnormal glucose: Secondary | ICD-10-CM | POA: Diagnosis not present

## 2018-11-29 DIAGNOSIS — J36 Peritonsillar abscess: Secondary | ICD-10-CM | POA: Diagnosis not present

## 2018-11-29 DIAGNOSIS — K056 Periodontal disease, unspecified: Secondary | ICD-10-CM | POA: Diagnosis not present

## 2018-11-29 DIAGNOSIS — R6884 Jaw pain: Secondary | ICD-10-CM | POA: Diagnosis not present

## 2018-12-02 DIAGNOSIS — F424 Excoriation (skin-picking) disorder: Secondary | ICD-10-CM | POA: Insufficient documentation

## 2018-12-10 ENCOUNTER — Emergency Department (HOSPITAL_COMMUNITY)
Admission: EM | Admit: 2018-12-10 | Discharge: 2018-12-11 | Disposition: A | Discharge status: Home / Self Care | Payer: Medicare Other | Attending: Emergency Medicine | Admitting: Emergency Medicine

## 2018-12-10 ENCOUNTER — Other Ambulatory Visit: Payer: Self-pay

## 2018-12-10 ENCOUNTER — Encounter (HOSPITAL_COMMUNITY): Payer: Self-pay

## 2018-12-10 DIAGNOSIS — M5489 Other dorsalgia: Secondary | ICD-10-CM | POA: Diagnosis not present

## 2018-12-10 DIAGNOSIS — R52 Pain, unspecified: Secondary | ICD-10-CM | POA: Diagnosis not present

## 2018-12-10 DIAGNOSIS — Z79899 Other long term (current) drug therapy: Secondary | ICD-10-CM | POA: Diagnosis not present

## 2018-12-10 DIAGNOSIS — J449 Chronic obstructive pulmonary disease, unspecified: Secondary | ICD-10-CM | POA: Insufficient documentation

## 2018-12-10 DIAGNOSIS — I10 Essential (primary) hypertension: Secondary | ICD-10-CM | POA: Diagnosis not present

## 2018-12-10 DIAGNOSIS — R109 Unspecified abdominal pain: Secondary | ICD-10-CM | POA: Diagnosis present

## 2018-12-10 DIAGNOSIS — K5730 Diverticulosis of large intestine without perforation or abscess without bleeding: Secondary | ICD-10-CM | POA: Diagnosis not present

## 2018-12-10 DIAGNOSIS — R1011 Right upper quadrant pain: Secondary | ICD-10-CM | POA: Diagnosis not present

## 2018-12-10 DIAGNOSIS — R1084 Generalized abdominal pain: Secondary | ICD-10-CM | POA: Diagnosis not present

## 2018-12-10 DIAGNOSIS — R1031 Right lower quadrant pain: Secondary | ICD-10-CM | POA: Diagnosis not present

## 2018-12-10 DIAGNOSIS — Z8505 Personal history of malignant neoplasm of liver: Secondary | ICD-10-CM | POA: Insufficient documentation

## 2018-12-10 DIAGNOSIS — Z87891 Personal history of nicotine dependence: Secondary | ICD-10-CM | POA: Diagnosis not present

## 2018-12-10 DIAGNOSIS — M549 Dorsalgia, unspecified: Secondary | ICD-10-CM | POA: Diagnosis not present

## 2018-12-10 DIAGNOSIS — M545 Low back pain: Secondary | ICD-10-CM | POA: Diagnosis not present

## 2018-12-10 LAB — COMPREHENSIVE METABOLIC PANEL
ALT: 13 U/L (ref 0–44)
AST: 23 U/L (ref 15–41)
Albumin: 4.5 g/dL (ref 3.5–5.0)
Alkaline Phosphatase: 75 U/L (ref 38–126)
Anion gap: 11 (ref 5–15)
BUN: 16 mg/dL (ref 8–23)
CO2: 31 mmol/L (ref 22–32)
Calcium: 9.7 mg/dL (ref 8.9–10.3)
Chloride: 88 mmol/L — ABNORMAL LOW (ref 98–111)
Creatinine, Ser: 0.69 mg/dL (ref 0.44–1.00)
GFR calc Af Amer: >60 mL/min (ref >60)
GFR calc non Af Amer: >60 mL/min (ref >60)
Glucose, Bld: 119 mg/dL — ABNORMAL HIGH (ref 70–99)
Potassium: 4.5 mmol/L (ref 3.5–5.1)
Sodium: 130 mmol/L — ABNORMAL LOW (ref 135–145)
Total Bilirubin: 0.3 mg/dL (ref 0.3–1.2)
Total Protein: 8.1 g/dL (ref 6.5–8.1)

## 2018-12-10 LAB — URINALYSIS, ROUTINE W REFLEX MICROSCOPIC
Bacteria, UA: NONE SEEN
Bilirubin Urine: NEGATIVE
Glucose, UA: NEGATIVE mg/dL
Ketones, ur: NEGATIVE mg/dL
Leukocytes,Ua: NEGATIVE
Nitrite: NEGATIVE
Protein, ur: NEGATIVE mg/dL
Specific Gravity, Urine: 1.009 (ref 1.005–1.030)
pH: 7 (ref 5.0–8.0)

## 2018-12-10 LAB — LIPASE, BLOOD: Lipase: 21 U/L (ref 11–51)

## 2018-12-10 LAB — CBC WITH DIFFERENTIAL/PLATELET
Abs Immature Granulocytes: 0.03 10*3/uL (ref 0.00–0.07)
Basophils Absolute: 0 10*3/uL (ref 0.0–0.1)
Basophils Relative: 0 %
Eosinophils Absolute: 0.2 10*3/uL (ref 0.0–0.5)
Eosinophils Relative: 3 %
HCT: 45.5 % (ref 36.0–46.0)
Hemoglobin: 14.7 g/dL (ref 12.0–15.0)
Immature Granulocytes: 0 %
Lymphocytes Relative: 24 %
Lymphs Abs: 1.8 10*3/uL (ref 0.7–4.0)
MCH: 30.9 pg (ref 26.0–34.0)
MCHC: 32.3 g/dL (ref 30.0–36.0)
MCV: 95.8 fL (ref 80.0–100.0)
Monocytes Absolute: 0.6 10*3/uL (ref 0.1–1.0)
Monocytes Relative: 8 %
Neutro Abs: 5 10*3/uL (ref 1.7–7.7)
Neutrophils Relative %: 65 %
Platelets: 244 10*3/uL (ref 150–400)
RBC: 4.75 MIL/uL (ref 3.87–5.11)
RDW: 12 % (ref 11.5–15.5)
WBC: 7.6 10*3/uL (ref 4.0–10.5)
nRBC: 0 % (ref 0.0–0.2)

## 2018-12-10 MED ORDER — HYDROCODONE-ACETAMINOPHEN 5-325 MG PO TABS
1.0000 | ORAL_TABLET | Freq: Once | ORAL | Status: AC
  Administered 2018-12-10: 1 via ORAL
  Filled 2018-12-10: qty 1

## 2018-12-10 MED ORDER — BUSPIRONE HCL 10 MG PO TABS
10.0000 mg | ORAL_TABLET | Freq: Once | ORAL | Status: AC
  Administered 2018-12-10: 10 mg via ORAL
  Filled 2018-12-10: qty 1

## 2018-12-10 NOTE — ED Notes (Signed)
Pt called out for her pain medication. Pt stated that she's "going into withdrawals" and that she needs her medication.

## 2018-12-10 NOTE — ED Triage Notes (Signed)
Pt BIB EMS from [facility] [phone] Dr). Pt c/o abdominal pain and back pain x3 days. A&Ox4. Pt not able to ambulate well at baseline.   99%RA

## 2018-12-10 NOTE — ED Provider Notes (Signed)
[place] [facility] DEPT Provider Note   CSN: [id number] Arrival date & time: 12/10/18  1932     History   Chief Complaint Chief Complaint  Patient presents with   Back Pain   Abdominal Pain    HPI Tina Bailey is a 73 y.o. female with past medical history significant for arthritis, COPD, fibromyalgia, osteoporosis, chronic pain presents emergency department today with chief complaint of abdominal and back pain x3 days.  Patient describes her abdominal pain as a cramping sensation in bilateral upper quadrants.  She states it has been intermittent.  Rates the pain 7 of 10 in severity.  She is also complaining of right-sided back pain.  States she has chronic back pain but this is new for her.  She denies any fall or recent trauma.  She describes the back pain as sharp.  Pain is worse with movement.  She takes Norco for chronic pain and states this has not helped the back pain.  Her last bowel movement was today and she states it was normal for her.  Denies fever, chills, chest pain, shortness of breath, cough, saddle anesthesia, numbness, weakness, weight loss, nausea, emesis, urinary frequency, gross hematuria.  Denies history of kidney stones.  Abdominal surgical history includes cholecystectomy and partial liver resection.    Past Medical History:  Diagnosis Date   Arthritis    DDD. Right shoulder"is frozen"-limited ROM. osteoporosis.   Blue toe syndrome ([place]) 11/27/2014   Dr. Claudia Pollock evaluating   Cancer [facility]) 12/2014   liver cancer   Complication of anesthesia    COPD (chronic obstructive pulmonary disease) ([place])    Fibromyalgia    Fracture of rib of right side    hx "osteoporosis"- states her dog nudge her on the side, next day developed great pain and was told has a fracture rib.   Macular degeneration    R eye   Osteoporosis    PONV (postoperative nausea and vomiting)    nausea,severe vomiting after 01-13-15 portal vein  embolization   Productive cough    Retina disorder    L eye, vision distorted, edema   Spine fracture due to birth trauma    TMJ disease    Wears glasses     Patient Active Problem List   Diagnosis Date Noted   Skin picking habit 12/02/2018   Thoracic aortic aneurysm without rupture ([place]) 04/13/2018   Pulmonary hypertension, unspecified ([place]) 04/13/2018   Bilateral leg edema 04/13/2018   Severe episode of recurrent major depressive disorder, without psychotic features ([place]) 04/02/2018   Acute on chronic diastolic CHF (congestive heart failure) (HCC)    Hyponatremia 02/28/2018   COPD (chronic obstructive pulmonary disease) ([place]) 02/28/2018   Tobacco abuse 02/28/2018   Acute blood loss anemia 12/06/2017   Thrombocytopenia ([place]) 12/06/2017   Severe protein-calorie malnutrition ([place]) 12/05/2017   Closed comminuted intertrochanteric fracture of proximal end of right femur ([place]) 12/04/2017   Chronic thoracic back pain 03/31/2017   Physical exam 01/09/2017   Malnutrition of moderate degree 12/22/2016   Anxiety about health 09/27/2016   Osteoporosis 07/13/2016   Chronic narcotic use 07/13/2016   Constipation 04/26/2015   Chronic pain 04/26/2015   Depression with anxiety 04/26/2015   COPD GOLD IV / still somking 03/03/2015   Cigarette smoker 03/03/2015   Hepatocellular carcinoma ([place]) 12/30/2014   Pancreatic mass 12/04/2014   Blue toe syndrome ([place]) 11/27/2014    Past Surgical History:  Procedure Laterality Date   ANGIOPLASTY  2008   no  stents required, no follow-up with cardiologist, no recurrent chest pain   CATARACT EXTRACTION, BILATERAL Bilateral    ESOPHAGOGASTRODUODENOSCOPY (EGD) WITH PROPOFOL N/A 06/25/2015   Procedure: ESOPHAGOGASTRODUODENOSCOPY (EGD) WITH PROPOFOL;  Surgeon: Milus Banister, MD;  Location: [facility] END[facility]COPY;  Service: Endoscopy;  Laterality: N/A;  stent removal    EYE SURGERY     cornea surgery   INTRAMEDULLARY (IM) NAIL  INTERTROCHANTERIC Right 12/04/2017   Procedure: INTRAMEDULLARY (IM) NAIL INTERTROCHANTRIC;  Surgeon: Rod Can, MD;  Location: [facility] ORS;  Service: Orthopedics;  Laterality: Right;   IR RADIOLOGIST EVAL & MGMT  03/29/2017   KIDNEY STONE SURGERY     LAPAROSCOPIC PARTIAL HEPATECTOMY N/A 03/04/2015   Procedure: DIAGNOSTIC LAPAROSCOPY, EXTENDED RIGHT HEPATECTOMY, WITH INTRAOPERATIVE ULTRASOUND;  Surgeon: Stark Klein, MD;  Location: [facility] ORS;  Service: General;  Laterality: N/A;   PARTIAL HYSTERECTOMY     portal vein embolization     01-13-15 -Dr. Cathlean Sauer.     OB History    Gravida  3   Para      Term      Preterm      AB      Living  3     SAB      TAB      Ectopic      Multiple      Live Births               Home Medications    Prior to Admission medications   Medication Sig Start Date End Date Taking? Authorizing Provider  acetaminophen (TYLENOL) 325 MG tablet Take 2 tablets (650 mg total) by mouth every 6 (six) hours as needed for mild pain (or Fever >/= 101). 03/06/18   Florencia Reasons, MD  ALPRAZolam Duanne Moron) 0.25 MG tablet Take 1 tablet (0.25 mg total) by mouth 3 (three) times daily as needed for anxiety. 06/27/18   Midge Minium, MD  bisacodyl (DULCOLAX) 10 MG suppository Place 1 suppository (10 mg total) rectally daily as needed for moderate constipation. 12/06/17   Debbe Odea, MD  busPIRone (BUSPAR) 10 MG tablet  08/25/18   [provider]  Calcium Carbonate-Vitamin D (CALCIUM 500+D HIGH POTENCY PO) Take 1 tablet by mouth daily as needed.     [provider]  Cholecalciferol (VITAMIN D3) 5000 units CAPS Take 5,000 Units by mouth daily as needed.     [provider]  DULoxetine (CYMBALTA) 30 MG capsule Take 1 tablet by mouth daily.    [provider]  EVENITY 105 MG/1.17ML SOSY Inject 210 mg into the skin once. Take once a month on the 4th of the month 12/11/17   [provider]  feeding supplement, ENSURE  ENLIVE, (ENSURE ENLIVE) LIQD Take 237 mLs by mouth 2 (two) times daily between meals. Patient taking differently: Take 1 Bottle by mouth daily.  12/06/17   Debbe Odea, MD  fluticasone (FLONASE) 50 MCG/ACT nasal spray Place 2 sprays into both nostrils daily. 05/15/18   Brunetta Jeans, PA-C  fluticasone (FLONASE) 50 MCG/ACT nasal spray Flonase    [provider]  Glycopyrrolate-Formoterol (BEVESPI AEROSPHERE) 9-4.8 MCG/ACT AERO Inhale 2 puffs into the lungs 2 (two) times daily. 05/30/17   Tanda Rockers, MD  guaiFENesin (MUCINEX) 600 MG 12 hr tablet Take 2 tablets (1,200 mg total) by mouth 2 (two) times daily as needed. 04/10/18   Brunetta Jeans, PA-C  HYDROcodone-acetaminophen (NORCO/VICODIN) 5-325 MG tablet Take 1 tablet by mouth 3 (three) times daily as needed.  09/14/18   Midge Minium, MD  ipratropium-albuterol (DUONEB) 0.5-2.5 (3) MG/3ML SOLN Take 3 mLs by nebulization every 4 (four) hours as needed (SOB).     [provider]  Multiple Vitamins-Minerals (CENTRUM SILVER ULTRA WOMENS PO) Take 1 tablet by mouth daily.     [provider]  mupirocin ointment (BACTROBAN) 2 % Apply 1 application topically 2 (two) times daily. 09/26/18   Midge Minium, MD  polyethylene glycol (MIRALAX / Floria Raveling) packet Take 17 g by mouth daily as needed. 05/01/18   Midge Minium, MD  promethazine (PHENERGAN) 12.5 MG tablet Take 12.5 mg by mouth every 6 (six) hours as needed for nausea or vomiting.    [provider]  senna-docusate (SENOKOT-S) 8.6-50 MG tablet Take 1 tablet by mouth 2 (two) times daily as needed for mild constipation. 05/01/18   Midge Minium, MD  spironolactone (ALDACTONE) 50 MG tablet Take 1 tablet (50 mg total) by mouth daily. 04/02/18   Midge Minium, MD  thiamine 100 MG tablet Take 1 tablet (100 mg total) by mouth daily. Patient taking differently: Take 100 mg by mouth daily as needed.  03/06/18   Florencia Reasons, MD  vitamin B-12  (CYANOCOBALAMIN) 1000 MCG tablet Take 1,000 mcg by mouth daily as needed.     [provider]    Family History Family History  Problem Relation Age of Onset   Hypertension Mother    Atrial fibrillation Mother    Heart disease Mother        after age 75   Stroke Father    Dementia Father    Heart disease Brother        After age 27- A-Fib   Heart attack Brother     Social History Social History   Tobacco Use   Smoking status: Former Smoker    Packs/day: 0.50    Years: 45.00    Pack years: 22.50    Types: Cigarettes    Quit date: 02/20/2018    Years since quitting: 0.8   Smokeless tobacco: Never Used  Substance Use Topics   Alcohol use: No    Alcohol/week: 0.0 standard drinks   Drug use: No     Allergies   Sertraline hcl, Amoxicillin-pot clavulanate, Clindamycin/lincomycin, Paroxetine hcl, and Sulfa antibiotics   Review of Systems Review of Systems  Constitutional: Negative for chills and fever.  HENT: Negative for congestion, ear discharge, ear pain, sinus pressure, sinus pain and sore throat.   Eyes: Negative for pain and redness.  Respiratory: Negative for cough and shortness of breath.   Cardiovascular: Negative for chest pain.  Gastrointestinal: Positive for abdominal pain. Negative for constipation, diarrhea, nausea and vomiting.  Genitourinary: Negative for dysuria and hematuria.  Musculoskeletal: Positive for back pain. Negative for neck pain.  Skin: Negative for wound.  Neurological: Negative for weakness, numbness and headaches.     Physical Exam Updated Vital Signs BP 130/70    Pulse 94    Temp 98.2 F (36.8 C) (Oral)    Resp 15    Ht 5\' 3"  (1.6 m)    Wt 51.7 kg    SpO2 94%    BMI 20.19 kg/m   Physical Exam Vitals signs and nursing note reviewed.  Constitutional:      General: She is not in acute distress.    Appearance: She is not ill-appearing.  HENT:     Head: Normocephalic and atraumatic.     Right Ear: Tympanic  membrane and external  ear normal.     Left Ear: Tympanic membrane and external ear normal.     Nose: Nose normal.     Mouth/Throat:     Mouth: Mucous membranes are moist.     Pharynx: Oropharynx is clear.  Eyes:     General: No scleral icterus.       Right eye: No discharge.        Left eye: No discharge.     Extraocular Movements: Extraocular movements intact.     Conjunctiva/sclera: Conjunctivae normal.     Pupils: Pupils are equal, round, and reactive to light.  Neck:     Musculoskeletal: Normal range of motion.     Vascular: No JVD.  Cardiovascular:     Rate and Rhythm: Normal rate and regular rhythm.     Pulses: Normal pulses.          Radial pulses are 2+ on the right side and 2+ on the left side.       Dorsalis pedis pulses are 2+ on the right side and 2+ on the left side.     Heart sounds: Normal heart sounds.  Pulmonary:     Comments: Lungs clear to auscultation in all fields. Symmetric chest rise. No wheezing, rales, or rhonchi. Abdominal:     General: Bowel sounds are normal.     Comments: Abdomen is soft, non-distended. Generalized tenderness. No rigidity, no guarding. No peritoneal signs.  Musculoskeletal: Normal range of motion.       Arms:     Right lower leg: No edema.     Left lower leg: No edema.     Comments: Full range of motion of the T-spine and L-spine Tenderness to palpation of paraspinous muscles of T-spine as depicted in image above. No tenderness to palpation of the spinous processes of T or  L-spine No crepitus, deformity or step-offs No tenderness to palpation of the paraspinous muscles of the L-spine   Skin:    General: Skin is warm and dry.     Capillary Refill: Capillary refill takes less than 2 seconds.  Neurological:     Mental Status: She is oriented to person, place, and time.     GCS: GCS eye subscore is 4. GCS verbal subscore is 5. GCS motor subscore is 6.     Comments: Fluent speech, no facial droop.  Psychiatric:        Behavior:  Behavior normal.      ED Treatments / Results  Labs (all labs ordered are listed, but only abnormal results are displayed) Labs Reviewed  COMPREHENSIVE METABOLIC PANEL - Abnormal; Notable for the following components:      Result Value   Sodium 130 (*)    Chloride 88 (*)    Glucose, Bld 119 (*)    All other components within normal limits  URINALYSIS, ROUTINE W REFLEX MICROSCOPIC - Abnormal; Notable for the following components:   Color, Urine STRAW (*)    Hgb urine dipstick MODERATE (*)    All other components within normal limits  LIPASE, BLOOD  CBC WITH DIFFERENTIAL/PLATELET    EKG None  Radiology Ct Thoracic Spine Wo Contrast  Result Date: 12/11/2018 CLINICAL DATA:  73 year old female with back pain. EXAM: CT THORACIC AND LUMBAR SPINE WITHOUT CONTRAST TECHNIQUE: Multidetector CT imaging of the thoracic and lumbar spine was performed without contrast. Multiplanar CT image reconstructions were also generated. COMPARISON:  Abdominal CT dated 12/11/2018 and chest CT dated 03/04/2018 FINDINGS: CT THORACIC SPINE FINDINGS Alignment: No acute  subluxation. There is exaggerated thoracic kyphosis secondary to multilevel chronic compression fractures and anterior wedging. Vertebrae: There is advanced osteopenia. Evaluation for fracture is limited secondary to severe osteopenia. There are multilevel old-appearing compression fractures with anterior wedging. There is near complete loss of vertebral body height at T3. No definite acute fracture. Paraspinal and other soft tissues: No perispinal fluid collection. Mild dilatation of the descending thoracic aorta measuring 3.2 cm. Disc levels: Multilevel degenerative changes. No acute pathology. CT LUMBAR SPINE FINDINGS Segmentation: 5 lumbar type vertebrae. Alignment: No acute fracture or subluxation. Vertebrae: Severe osteopenia. Multilevel old-appearing compression fractures. No definite acute fracture. Paraspinal and other soft tissues: No  paraspinal fluid collection. Disc levels: Multilevel degenerative changes. Disc desiccation and vacuum phenomena at T12-L1 and L5-S1. IMPRESSION: 1. No acute/traumatic thoracic or lumbar spine pathology. 2. Severe osteopenia with multilevel old-appearing compression fractures. Electronically Signed   By: Anner Crete M.D.   On: 12/11/2018 01:00   Ct Abdomen Pelvis W Contrast  Result Date: 12/11/2018 CLINICAL DATA:  73 year old female with abdominal and back pain. EXAM: CT ABDOMEN AND PELVIS WITH CONTRAST TECHNIQUE: Multidetector CT imaging of the abdomen and pelvis was performed using the standard protocol following bolus administration of intravenous contrast. CONTRAST:  132mL OMNIPAQUE IOHEXOL 300 MG/ML  SOLN COMPARISON:  CT of the abdomen pelvis dated 01/11/2018 and lumbar spine CT dated 12/11/2018 FINDINGS: Lower chest: The visualized lung bases are clear. No intra-abdominal free air or free fluid. Hepatobiliary: Stable appearance of postsurgical changes of partial liver resection. Cholecystectomy. There is dilatation of the common bile duct similar to prior CT. Pancreas: Unremarkable. No pancreatic ductal dilatation or surrounding inflammatory changes. Spleen: Normal in size without focal abnormality. Adrenals/Urinary Tract: The adrenal glands are unremarkable. There is no hydronephrosis on either side. There is symmetric enhancement and excretion of contrast by both kidneys. Subcentimeter left renal hypodense lesion is not characterized. The urinary bladder is grossly unremarkable. Stomach/Bowel: There is sigmoid diverticulosis without active inflammatory changes. There is no bowel obstruction or active inflammation. The appendix is unremarkable as visualized. Vascular/Lymphatic: Moderate aortoiliac atherosclerotic disease. The IVC is unremarkable. No portal venous gas. There is no adenopathy. Reproductive: Hysterectomy. No adnexal masses. Other: None Musculoskeletal: osteopenia. Partially visualized  right femoral intramedullary rod and transcervical screw. No acute osseous pathology. Multilevel chronic appearing compression fractures with slight progression of compression fracture at L1 since the CT of 2019. There is disc desiccation and vacuum phenomena at T12-L1 and L5-S1. IMPRESSION: 1. No acute intra-abdominal or pelvic pathology. 2. Sigmoid diverticulosis. No bowel obstruction or active inflammation. Normal appendix. Aortic Atherosclerosis (ICD10-I70.0). Electronically Signed   By: Anner Crete M.D.   On: 12/11/2018 00:52   Ct L-spine No Charge  Result Date: 12/11/2018 CLINICAL DATA:  73 year old female with back pain. EXAM: CT THORACIC AND LUMBAR SPINE WITHOUT CONTRAST TECHNIQUE: Multidetector CT imaging of the thoracic and lumbar spine was performed without contrast. Multiplanar CT image reconstructions were also generated. COMPARISON:  Abdominal CT dated 12/11/2018 and chest CT dated 03/04/2018 FINDINGS: CT THORACIC SPINE FINDINGS Alignment: No acute subluxation. There is exaggerated thoracic kyphosis secondary to multilevel chronic compression fractures and anterior wedging. Vertebrae: There is advanced osteopenia. Evaluation for fracture is limited secondary to severe osteopenia. There are multilevel old-appearing compression fractures with anterior wedging. There is near complete loss of vertebral body height at T3. No definite acute fracture. Paraspinal and other soft tissues: No perispinal fluid collection. Mild dilatation of the descending thoracic aorta measuring 3.2 cm. Disc levels: Multilevel degenerative changes.  No acute pathology. CT LUMBAR SPINE FINDINGS Segmentation: 5 lumbar type vertebrae. Alignment: No acute fracture or subluxation. Vertebrae: Severe osteopenia. Multilevel old-appearing compression fractures. No definite acute fracture. Paraspinal and other soft tissues: No paraspinal fluid collection. Disc levels: Multilevel degenerative changes. Disc desiccation and vacuum  phenomena at T12-L1 and L5-S1. IMPRESSION: 1. No acute/traumatic thoracic or lumbar spine pathology. 2. Severe osteopenia with multilevel old-appearing compression fractures. Electronically Signed   By: Anner Crete M.D.   On: 12/11/2018 01:00    Procedures Procedures (including critical care time)  Medications Ordered in ED Medications  sodium chloride (PF) 0.9 % injection (has no administration in time range)  HYDROcodone-acetaminophen (NORCO/VICODIN) 5-325 MG per tablet 1 tablet (1 tablet Oral Given 12/10/18 2357)  busPIRone (BUSPAR) tablet 10 mg (10 mg Oral Given 12/10/18 2356)  iohexol (OMNIPAQUE) 300 MG/ML solution 100 mL (100 mLs Intravenous Contrast Given 12/11/18 0009)     Initial Impression / Assessment and Plan / ED Course  I have reviewed the triage vital signs and the nursing notes.  Pertinent labs & imaging results that were available during my care of the patient were reviewed by me and considered in my medical decision making (see chart for details).  Clinical Course as of Dec 11 106  Mon Dec 10, 2018  2330 Patient seen by myself as well as PA provider.  Briefly is a 73 year old female presenting from nursing home with complaint of lower back pain and abdominal pain.  She reports that she has a history of easy bone breaks and fractures even from twisting her bed and feels like she may have a broken bone in her lower back.  She also reports abdominal pain.  She states this is been intolerable today.  My exam the patient appears mildly uncomfortable but is in no acute distress.  She has no focal abdominal tenderness.  She has nonfocal back pain that I cannot elicit on exam.  However given her history believe it is reasonable to obtain CT imaging of her abdomen as well as the L-spine and T-spine where she states she is having her symptoms.  We will give her pain medication she is due for in the evening.   [MT]    Clinical Course User Index [MT] Trifan, Carola Rhine, MD    Patient seen and examined. Patient nontoxic appearing, in no apparent distress, vitals WNL.  On exam she has generalized abdominal tenderness, no peritoneal signs.  She does not have midline tenderness in cervical, thoracic or lumbar spine.  No signs of bruising or wound.  She does have extreme tenderness to right paraspinal muscles of thoracic spine.  CMP and lipase are unremarkable.  Electrolyte panel shows hyponatremia, similar compared to prior.Marland Kitchen  UA is without signs of infection, does show moderate blood. Patient given home dose of pain medication including norco and buspar.  Even her abdominal tenderness, CT abdomen pelvis ordered.  Also with her back pain and history of osteoporosis CT of lumbar and thoracic spine ordered.  Ct lumbar and thoracic spine shows severe osteopenia with multilevel old-appearing compression fractures.  No acute or traumatic pathology.  CT abdomen pelvis shows sigmoid diverticulosis, no signs of inflammation or diverticulitis.  No signs of appendicitis, no hydronephrosis.  On reassessment patient reports pain has significantly improved.  Abdominal exam is benign, no peritoneal signs.  She is tolerating p.o. intake while in the emergency department.  The patient appears reasonably screened and/or stabilized for discharge and I doubt any other medical condition or  other [place] requiring further screening, evaluation, or treatment in the ED at this time prior to discharge. The patient is safe for discharge with strict return precautions discussed. Recommend pcp follow up to have UA rechecked looking for blood.  Chart review shows she does not have history of blood in urine.  Recommend outpatient urology follow up if blood in repeat UA. The patient was discussed with and seen by Dr. Langston Masker who agrees with the treatment plan.  Portions of this note were generated with [employer]. Dictation errors may occur despite best attempts at proofreading.    Final Clinical  Impressions(s) / ED Diagnoses   Final diagnoses:  Pain    ED Discharge Orders    None       Flint Melter 12/11/18 0118    Wyvonnia Dusky, MD 12/11/18 [phone]

## 2018-12-11 ENCOUNTER — Emergency Department (HOSPITAL_COMMUNITY): Payer: Medicare Other

## 2018-12-11 ENCOUNTER — Encounter (HOSPITAL_COMMUNITY): Payer: Self-pay

## 2018-12-11 DIAGNOSIS — K5730 Diverticulosis of large intestine without perforation or abscess without bleeding: Secondary | ICD-10-CM | POA: Diagnosis not present

## 2018-12-11 DIAGNOSIS — M549 Dorsalgia, unspecified: Secondary | ICD-10-CM | POA: Diagnosis not present

## 2018-12-11 MED ORDER — SODIUM CHLORIDE (PF) 0.9 % IJ SOLN
INTRAMUSCULAR | Status: AC
  Filled 2018-12-11: qty 50

## 2018-12-11 MED ORDER — IOHEXOL 300 MG/ML  SOLN
100.0000 mL | Freq: Once | INTRAMUSCULAR | Status: AC | PRN
  Administered 2018-12-11: 100 mL via INTRAVENOUS

## 2018-12-11 NOTE — Discharge Instructions (Signed)
You have been seen today for back pain and abdominal pain. Please read and follow all provided instructions. Return to the emergency room for worsening condition or new concerning symptoms.    -Your CT scan today did not show any signs of injury to your back or belly. -Your urine sample did have blood in it.  No signs of urinary tract infection however.  1. Medications:  Continue usual home medications Take medications as prescribed. Please review all of the medicines and only take them if you do not have an allergy to them.   2. Treatment: rest, drink plenty of fluids  3. Follow Up: Please follow up with your primary doctor within 1 week to have urine sample rechecked.  If there continues to be blood in the sample recommend outpatient urology follow-up  ?

## 2018-12-11 NOTE — ED Notes (Signed)
Called [facility] for transport.  

## 2018-12-12 ENCOUNTER — Telehealth: Payer: Self-pay

## 2018-12-12 DIAGNOSIS — G894 Chronic pain syndrome: Secondary | ICD-10-CM | POA: Diagnosis not present

## 2018-12-12 DIAGNOSIS — M62830 Muscle spasm of back: Secondary | ICD-10-CM | POA: Diagnosis not present

## 2018-12-12 NOTE — Telephone Encounter (Signed)
Patient called back stating that she is currently a resident of [facility] and has follow up appts scheduled with the physicians at her location. Denied f/u appt with Dr. Birdie Riddle until she is released from [facility].

## 2018-12-12 NOTE — Telephone Encounter (Signed)
Reached out to patient's daughter, [name], to schedule an ER f/u per discharge notes. LMOVM to return call.

## 2018-12-13 DIAGNOSIS — J36 Peritonsillar abscess: Secondary | ICD-10-CM | POA: Diagnosis not present

## 2018-12-13 DIAGNOSIS — J449 Chronic obstructive pulmonary disease, unspecified: Secondary | ICD-10-CM | POA: Diagnosis not present

## 2018-12-13 DIAGNOSIS — R7309 Other abnormal glucose: Secondary | ICD-10-CM | POA: Diagnosis not present

## 2018-12-18 ENCOUNTER — Encounter: Payer: Self-pay | Admitting: Nurse Practitioner

## 2018-12-18 ENCOUNTER — Other Ambulatory Visit: Payer: Self-pay

## 2018-12-18 ENCOUNTER — Inpatient Hospital Stay: Payer: Medicare Other | Attending: Hematology

## 2018-12-18 ENCOUNTER — Inpatient Hospital Stay (HOSPITAL_BASED_OUTPATIENT_CLINIC_OR_DEPARTMENT_OTHER): Payer: Medicare Other | Admitting: Nurse Practitioner

## 2018-12-18 VITALS — BP 131/74 | HR 81 | Temp 98.3°F | Resp 18 | Ht 63.0 in | Wt 115.4 lb

## 2018-12-18 DIAGNOSIS — Z79899 Other long term (current) drug therapy: Secondary | ICD-10-CM | POA: Insufficient documentation

## 2018-12-18 DIAGNOSIS — Z87891 Personal history of nicotine dependence: Secondary | ICD-10-CM | POA: Diagnosis not present

## 2018-12-18 DIAGNOSIS — Z8505 Personal history of malignant neoplasm of liver: Secondary | ICD-10-CM | POA: Insufficient documentation

## 2018-12-18 DIAGNOSIS — M810 Age-related osteoporosis without current pathological fracture: Secondary | ICD-10-CM | POA: Insufficient documentation

## 2018-12-18 DIAGNOSIS — J449 Chronic obstructive pulmonary disease, unspecified: Secondary | ICD-10-CM | POA: Diagnosis not present

## 2018-12-18 DIAGNOSIS — K869 Disease of pancreas, unspecified: Secondary | ICD-10-CM | POA: Diagnosis not present

## 2018-12-18 DIAGNOSIS — Z90710 Acquired absence of both cervix and uterus: Secondary | ICD-10-CM | POA: Diagnosis not present

## 2018-12-18 DIAGNOSIS — C220 Liver cell carcinoma: Secondary | ICD-10-CM

## 2018-12-18 LAB — COMPREHENSIVE METABOLIC PANEL
ALT: 10 U/L (ref 0–44)
AST: 15 U/L (ref 15–41)
Albumin: 4.3 g/dL (ref 3.5–5.0)
Alkaline Phosphatase: 85 U/L (ref 38–126)
Anion gap: 8 (ref 5–15)
BUN: 12 mg/dL (ref 8–23)
CO2: 31 mmol/L (ref 22–32)
Calcium: 9.6 mg/dL (ref 8.9–10.3)
Chloride: 85 mmol/L — ABNORMAL LOW (ref 98–111)
Creatinine, Ser: 0.71 mg/dL (ref 0.44–1.00)
GFR calc Af Amer: >60 mL/min (ref >60)
GFR calc non Af Amer: >60 mL/min (ref >60)
Glucose, Bld: 101 mg/dL — ABNORMAL HIGH (ref 70–99)
Potassium: 4.7 mmol/L (ref 3.5–5.1)
Sodium: 124 mmol/L — ABNORMAL LOW (ref 135–145)
Total Bilirubin: 0.5 mg/dL (ref 0.3–1.2)
Total Protein: 7.3 g/dL (ref 6.5–8.1)

## 2018-12-18 LAB — CBC WITH DIFFERENTIAL/PLATELET
Abs Immature Granulocytes: 0.01 10*3/uL (ref 0.00–0.07)
Basophils Absolute: 0 10*3/uL (ref 0.0–0.1)
Basophils Relative: 1 %
Eosinophils Absolute: 0.1 10*3/uL (ref 0.0–0.5)
Eosinophils Relative: 1 %
HCT: 43.1 % (ref 36.0–46.0)
Hemoglobin: 14.1 g/dL (ref 12.0–15.0)
Immature Granulocytes: 0 %
Lymphocytes Relative: 34 %
Lymphs Abs: 2.4 10*3/uL (ref 0.7–4.0)
MCH: 30.3 pg (ref 26.0–34.0)
MCHC: 32.7 g/dL (ref 30.0–36.0)
MCV: 92.5 fL (ref 80.0–100.0)
Monocytes Absolute: 0.8 10*3/uL (ref 0.1–1.0)
Monocytes Relative: 11 %
Neutro Abs: 3.7 10*3/uL (ref 1.7–7.7)
Neutrophils Relative %: 53 %
Platelets: 248 10*3/uL (ref 150–400)
RBC: 4.66 MIL/uL (ref 3.87–5.11)
RDW: 12 % (ref 11.5–15.5)
WBC: 7 10*3/uL (ref 4.0–10.5)
nRBC: 0 % (ref 0.0–0.2)

## 2018-12-18 NOTE — Patient Instructions (Signed)
Your CT abdomen/pelvis scan on 9/22 did not show evidence of cancer recurrence or spread. We will monitor you with MRI and f/u in 6 months. I recommend to continue follow up with ortho and pain specialist for your back pain. Your sodium is low (124), although you do not show symptoms. This may be related to aldactone. If it is pertinent you remain on this medication, consider reducing the dose. You may also increase your salt intake, MD at your facility can consider salt tablets.   Our office will call you with future appointments for lab, MRI of the liver, and follow up with Dr. Burr Medico in 6 months. Please call us sooner with new or worsening issues, such as abdominal pain, bloating, weight loss, or yellowing of the skin/eyes.   Tina Rue, NP 12/18/18 [phone]

## 2018-12-18 NOTE — Progress Notes (Signed)
[place]   [phone] Fax:([phone]) [phone]   Clinic Follow up Note   Patient Care Team: Midge Minium, MD as PCP - General (Family Medicine) Buford Dresser, MD as PCP - Cardiology (Cardiology) Elam Dutch, MD as Consulting Physician (Vascular Surgery) Stark Klein, MD as Consulting Physician (General Surgery) Truitt Merle, MD as Consulting Physician (Hematology) Suella Broad, MD as Consulting Physician (Physical Medicine and Rehabilitation) Valinda Party, MD as Consulting Physician (Rheumatology) Tanda Rockers, MD as Consulting Physician (Pulmonary Disease) Abbie Sons, MD as Referring Physician (Psychiatry) 12/18/2018  CHIEF COMPLAINT: Follow-up of [place]  SUMMARY OF ONCOLOGIC HISTORY: Oncology History Overview Note  Hepatocellular carcinoma [facility])   Staging form: Liver (Excluding Intrahepatic Bile Ducts), AJCC 7th Edition     Clinical stage from 12/30/2014: Stage Unknown (T1, NX, M0) - Unsigned     Hepatocellular carcinoma ([place])  12/02/2014 Imaging   Liver MRI showed a large lesion in segment 5 and 40, highly suspicious for malignancy. And a 1.2 cm indeterminate pancreatic body lesion. Mildly enlarged portocaval lymph node. Cholelithiasis.   12/19/2014 Initial Diagnosis   Hepatocellular carcinoma ([place])   12/19/2014 Initial Biopsy   Liver mass biopsy showed parathyroid carcinoma, positive for hep par 1, cytokeratin 8/18, cytokeratin 7 and MOC-31.   12/25/2014 Imaging   CT chest showed a 3 mm groundglass probably nodule in the right upper lobe, mild right pericardial phrenic lymphadenopathy, no definitive evidence of metastasis.   01/13/2015 Procedure   Liquid embolization of the right portal venous system by Interventional Radiologist Dr. Laurence Ferrari   03/04/2015 Surgery   Liver right lobe hepatectomy, and the portal lymph nodes biopsy   03/04/2015 Pathology Results   Hepatocellular carcinoma, poorly differentiated,  spanning 8.5 cm, surgical margins were negative, lymphovascular invasion is identified, for portal lymph nodes were negative.    02/29/2016 Imaging   CT chest w contrast 02/29/2016 IMPRESSION: 1. Status post partial right hepatectomy, without recurrent or metastatic disease. 2. Removal of common duct stent since 06/10/2015. Upper normal to mildly dilated common duct after cholecystectomy. Recommend attention on follow-up. 3. Similar pancreatic head lesion, as detailed on prior MRI. 4. Pelvic floor laxity with cystocele. 5.  Aortic atherosclerosis.   10/14/2016 Imaging   MRI Abdomen W WO Contrast 10/14/16 IMPRESSION: 1. Stable appearance of the liver status post right hepatectomy. No evidence of recurrent tumor or metastatic disease. 2. Suggested slight interval enlargement of known cystic pancreatic mass, likely communicating with the main pancreatic duct and probably reflecting an intraductal papillary mucinous neoplasm. This demonstrates no aggressive characteristics, and continued surveillance suggested.    10/20/2017 Imaging   10/20/2017 MRI Abdomen IMPRESSION: 1. Stable appearance of the liver status post right hepatectomy. No evidence of recurrent tumor or metastatic disease. 2. Unchanged appearance of cystic pancreatic lesion which appears to communicate with the main pancreatic duct without main duct dilatation. Findings are compatible with intraductal papillary mucinous neoplasm. Continued interval follow-up is advised. The next follow-up examination should be obtained in 12 months. This recommendation follows ACR consensus guidelines: Management of Incidental Pancreatic Cysts: A White Paper of the ACR Incidental Findings Committee. J Am Coll Radiol [phone].   12/11/2018 Imaging   CT AP w contrast Hepatobiliary: Stable appearance of postsurgical changes of partial liver resection. cholecystectomy. There is dilatation of the common bile duct similar to prior  CT.  IMPRESSION: 1. No acute intra-abdominal or pelvic pathology. 2. Sigmoid diverticulosis. No bowel obstruction or active inflammation. Normal appendix.  CURRENT THERAPY: Surveillance  INTERVAL HISTORY: Tina Bailey returns for follow-up as scheduled.  She was seen in the ED on 12/11/2018 for back and abdominal pain.  CT of the abdomen and pelvis was unremarkable.  CT of the spine showed severe osteopenia and old appearing compression fractures. She is not getting bone injections currently because her routine provider is not seeing her as she lives in a senior facility and [phone]. She takes calcium and vitamin D.   Today she presents from [place] facility. She is doing well. She is out of bed but not very active due to [place] restrictions at her residence and back pain. She has chronic pain in her back which feels "explosive" and contributes to abdominal pain. She also notes abdominal spasm, gas, and burping. She is working with ortho and pain specialist. Normal appetite. Has gained some weight. Denies n/v or change in bowel habits. Denies bleeding. No recent fever, chills, or chest pain. Breathing improved when she quit smoking earlier this year. She recently finished antibiotics for an oral infection. No recent yellowing of skin or eyes.     MEDICAL HISTORY:  Past Medical History:  Diagnosis Date   Arthritis    DDD. Right shoulder"is frozen"-limited ROM. osteoporosis.   Blue toe syndrome ([place]) 11/27/2014   Dr. Claudia Pollock evaluating   Cancer [facility]) 12/2014   liver cancer   Complication of anesthesia    COPD (chronic obstructive pulmonary disease) ([place])    Fibromyalgia    Fracture of rib of right side    hx "osteoporosis"- states her dog nudge her on the side, next day developed great pain and was told has a fracture rib.   Macular degeneration    R eye   Osteoporosis    PONV (postoperative nausea and vomiting)    nausea,severe vomiting after 01-13-15 portal  vein embolization   Productive cough    Retina disorder    L eye, vision distorted, edema   Spine fracture due to birth trauma    TMJ disease    Wears glasses     SURGICAL HISTORY: Past Surgical History:  Procedure Laterality Date   ANGIOPLASTY  2008   no stents required, no follow-up with cardiologist, no recurrent chest pain   CATARACT EXTRACTION, BILATERAL Bilateral    ESOPHAGOGASTRODUODENOSCOPY (EGD) WITH PROPOFOL N/A 06/25/2015   Procedure: ESOPHAGOGASTRODUODENOSCOPY (EGD) WITH PROPOFOL;  Surgeon: Milus Banister, MD;  Location: [facility] ENDOSCOPY;  Service: Endoscopy;  Laterality: N/A;  stent removal    EYE SURGERY     cornea surgery   INTRAMEDULLARY (IM) NAIL INTERTROCHANTERIC Right 12/04/2017   Procedure: INTRAMEDULLARY (IM) NAIL INTERTROCHANTRIC;  Surgeon: Rod Can, MD;  Location: [facility] ORS;  Service: Orthopedics;  Laterality: Right;   IR RADIOLOGIST EVAL & MGMT  03/29/2017   KIDNEY STONE SURGERY     LAPAROSCOPIC PARTIAL HEPATECTOMY N/A 03/04/2015   Procedure: DIAGNOSTIC LAPAROSCOPY, EXTENDED RIGHT HEPATECTOMY, WITH INTRAOPERATIVE ULTRASOUND;  Surgeon: Stark Klein, MD;  Location: [facility] ORS;  Service: General;  Laterality: N/A;   PARTIAL HYSTERECTOMY     portal vein embolization     01-13-15 -Dr. [facility].    I have reviewed the social history and family history with the patient and they are unchanged from previous note.  ALLERGIES:  is allergic to sertraline hcl; amoxicillin-pot clavulanate; clindamycin/lincomycin; paroxetine hcl; and sulfa antibiotics.  MEDICATIONS:  Current Outpatient Medications  Medication Sig Dispense Refill   acetaminophen (TYLENOL) 325 MG tablet Take 2 tablets (650 mg total) by mouth every 6 (six)  hours as needed for mild pain (or Fever >/= 101).     ALPRAZolam (XANAX) 0.25 MG tablet Take 1 tablet (0.25 mg total) by mouth 3 (three) times daily as needed for anxiety. 90 tablet 1   bisacodyl (DULCOLAX) 10 MG suppository Place 1  suppository (10 mg total) rectally daily as needed for moderate constipation. 12 suppository 0   busPIRone (BUSPAR) 10 MG tablet      Calcium Carbonate-Vitamin D (CALCIUM 500+D HIGH POTENCY PO) Take 1 tablet by mouth daily as needed.      Cholecalciferol (VITAMIN D3) 5000 units CAPS Take 5,000 Units by mouth daily as needed.      DULoxetine (CYMBALTA) 30 MG capsule Take 1 tablet by mouth daily.     fluticasone (FLONASE) 50 MCG/ACT nasal spray Place 2 sprays into both nostrils daily. 16 g 0   fluticasone (FLONASE) 50 MCG/ACT nasal spray Flonase     Glycopyrrolate-Formoterol (BEVESPI AEROSPHERE) 9-4.8 MCG/ACT AERO Inhale 2 puffs into the lungs 2 (two) times daily. 1 Inhaler 11   guaiFENesin (MUCINEX) 600 MG 12 hr tablet Take 2 tablets (1,200 mg total) by mouth 2 (two) times daily as needed. 30 tablet 0   HYDROcodone-acetaminophen (NORCO/VICODIN) 5-325 MG tablet Take 1 tablet by mouth 3 (three) times daily as needed. 90 tablet 0   ipratropium-albuterol (DUONEB) 0.5-2.5 (3) MG/3ML SOLN Take 3 mLs by nebulization every 4 (four) hours as needed (SOB).      Multiple Vitamins-Minerals (CENTRUM SILVER ULTRA WOMENS PO) Take 1 tablet by mouth daily.      mupirocin ointment (BACTROBAN) 2 % Apply 1 application topically 2 (two) times daily. 30 g 1   polyethylene glycol (MIRALAX / GLYCOLAX) packet Take 17 g by mouth daily as needed. 26 each 6   promethazine (PHENERGAN) 12.5 MG tablet Take 12.5 mg by mouth every 6 (six) hours as needed for nausea or vomiting.     senna-docusate (SENOKOT-S) 8.6-50 MG tablet Take 1 tablet by mouth 2 (two) times daily as needed for mild constipation. 60 tablet 12   spironolactone (ALDACTONE) 50 MG tablet Take 1 tablet (50 mg total) by mouth daily. 30 tablet 3   thiamine 100 MG tablet Take 1 tablet (100 mg total) by mouth daily. (Patient taking differently: Take 100 mg by mouth daily as needed. )     vitamin B-12 (CYANOCOBALAMIN) 1000 MCG tablet Take 1,000 mcg by  mouth daily as needed.      EVENITY 105 MG/1.17ML SOSY Inject 210 mg into the skin once. Take once a month on the 4th of the month     feeding supplement, ENSURE ENLIVE, (ENSURE ENLIVE) LIQD Take 237 mLs by mouth 2 (two) times daily between meals. (Patient taking differently: Take 1 Bottle by mouth daily. ) 237 mL 12   No current facility-administered medications for this visit.     PHYSICAL EXAMINATION: ECOG PERFORMANCE STATUS: 2 - Symptomatic, <50% confined to bed  Vitals:   12/18/18 1146 12/18/18 1147  BP: (!) 153/65 131/74  Pulse: 81   Resp: 18   Temp: 98.3 F (36.8 C)   SpO2: 93%    Filed Weights   12/18/18 1146  Weight: 115 lb 6.4 oz (52.3 kg)    GENERAL:alert, no distress and comfortable SKIN: no rash  EYES: sclera clear LUNGS: clear with normal breathing effort HEART: regular rate & rhythm, no lower extremity edema ABDOMEN:abdomen soft and normal bowel sounds. No hepatomegaly. Mild generalized tenderness  Musculoskeletal: kyphosis  NEURO: alert & oriented x 3  with fluent speech, normal gait   LABORATORY DATA:  I have reviewed the data as listed CBC Latest Ref Rng & Units 12/18/2018 12/10/2018 04/12/2018  WBC 4.0 - 10.5 K/uL 7.0 7.6 7.9  Hemoglobin 12.0 - 15.0 g/dL 14.1 14.7 13.9  Hematocrit 36.0 - 46.0 % 43.1 45.5 44.1  Platelets 150 - 400 K/uL 248 244 254     CMP Latest Ref Rng & Units 12/18/2018 12/10/2018 04/12/2018  Glucose 70 - 99 mg/dL 101(H) 119(H) 97  BUN 8 - 23 mg/dL 12 16 16   Creatinine 0.44 - 1.00 mg/dL 0.71 0.69 0.61  Sodium 135 - 145 mmol/L 124(L) 130(L) 134(L)  Potassium 3.5 - 5.1 mmol/L 4.7 4.5 4.9  Chloride 98 - 111 mmol/L 85(L) 88(L) 96(L)  CO2 22 - 32 mmol/L 31 31 31   Calcium 8.9 - 10.3 mg/dL 9.6 9.7 9.5  Total Protein 6.5 - 8.1 g/dL 7.3 8.1 6.8  Total Bilirubin 0.3 - 1.2 mg/dL 0.5 0.3 0.4  Alkaline Phos 38 - 126 U/L 85 75 131(H)  AST 15 - 41 U/L 15 23 14(L)  ALT 0 - 44 U/L 10 13 12       RADIOGRAPHIC STUDIES: I have personally  reviewed the radiological images as listed and agreed with the findings in the report. No results found.   ASSESSMENT & PLAN: Tina Bailey is a 73 y.o. female with history of  1. Hepatocellular carcinoma, pT3bN0M0, stage IIIB, poorly differentiated  -Diagnosed in 11/2014. Treated with embolization and right hepatectomy. Currently on surveillance. -serial imaging showed no evidence of recurrence  -CT AP w contrast negative for recurrence  -plan to monitor at least 5 years  2. Pancreatic lesion -f/u with MRI -Stable  3. COPD and smoking cessation  -f/u with PCP and Dr. Melvyn Novas -She has quit smoking completely early 2020 -breathing improved   4. Chronic compression fracture and multiple fractures and body pain  -She has had multiple fractures in her spine, sternum, and the right hip -I encouraged her to follow-up with her orthopedic surgeon, and her pain specialist Dr. Idamae Schuller  5. Osteoporosis -Stopped FORTEO in 2018 because she didn't want injections -Currently on multivitamins and Vitamin d. Continue. -She had multiple fractures including hip and sternal fractures. She prefers not to lay flat because fo her previous injuries and generalized body aches.  -severe osteopenia noted on thoracic and lumbar CTs on 9/22.  -Continue f/u with ortho and pain specialist   Disposition: Mr. Alicea appears stable. Her physical exam is nonspecific. CBC and LFTs normal. AFP is pending. Recent CT AP w contrast on 9/22 is negative for evidence of recurrence. Will continue surveillance. She will return for lab, MRI of the abdomen, and f/u with Dr. Burr Medico in 6 months. She is 4 years out from initial diagnosis, her recurrence risk is reduced. Plan to monitor for at least 5 years. I spoke with her daughter today and updated her. I reviewed the plan with Dr. Burr Medico   She is hyponatremic today, Na 124, asymptomatic. Possibly related to aldactone. If this medication is absolutely necessary, consider  reducing dose to 25 mg daily. I also recommend to increase salt intake and consider salt tab. Will defer to MD at her residential facility. I gave the patient my written recommendations.   Continue f/u with ortho and pain specialist for back pain. Continue calcium and vitamin D for osteoporosis. She plans to resume medication once her PCP begins seeing her again after COVID19 restrictions are eased.    Orders Placed  This Encounter  Procedures   MR Abdomen W Wo Contrast    Standing Status:   Future    Standing Expiration Date:   02/17/2020    Order Specific Question:   If indicated for the ordered procedure, I authorize the administration of contrast media per Radiology protocol    Answer:   Yes    Order Specific Question:   What is the patient's sedation requirement?    Answer:   No Sedation    Order Specific Question:   Does the patient have a pacemaker or implanted devices?    Answer:   No    Order Specific Question:   Radiology Contrast Protocol - do NOT remove file path    Answer:   [email or web address]    Order Specific Question:   Preferred imaging location?    Answer:   [facility] (table limit-350 lbs)   All questions were answered. The patient knows to call the clinic with any problems, questions or concerns. No barriers to learning was detected. I spent 20 minutes counseling the patient face to face. The total time spent in the appointment was 25 minutes and more than 50% was on counseling and review of test results     Alla Feeling, NP 12/18/18

## 2018-12-19 ENCOUNTER — Telehealth: Payer: Self-pay | Admitting: Nurse Practitioner

## 2018-12-19 LAB — AFP TUMOR MARKER: AFP, Serum, Tumor Marker: 3.2 ng/mL (ref 0.0–8.3)

## 2018-12-19 NOTE — Telephone Encounter (Signed)
Scheduled appt per 9/29 los.  Sent a staff message and a calendar will be mailed out.

## 2018-12-27 ENCOUNTER — Telehealth: Payer: Self-pay | Admitting: Cardiology

## 2018-12-27 DIAGNOSIS — G4700 Insomnia, unspecified: Secondary | ICD-10-CM | POA: Diagnosis not present

## 2018-12-27 DIAGNOSIS — J449 Chronic obstructive pulmonary disease, unspecified: Secondary | ICD-10-CM | POA: Diagnosis not present

## 2018-12-27 DIAGNOSIS — J36 Peritonsillar abscess: Secondary | ICD-10-CM | POA: Diagnosis not present

## 2018-12-27 DIAGNOSIS — I509 Heart failure, unspecified: Secondary | ICD-10-CM | POA: Diagnosis not present

## 2018-12-27 NOTE — Telephone Encounter (Signed)
We were using the spironolactone for swelling--it is ok to stop it, but if she starts to have worsening swelling or rising BP we will need to discuss an alternative medication. Can we have her stop for now and then I'd be happy to do an office or video visit with them in 2-3 weeks to make sure there are not swelling issues? Thanks.

## 2018-12-27 NOTE — Telephone Encounter (Signed)
Patient daughter states that every time she takes the medication- she becomes very nauseated, and feels bad majority of the day, she has tried to move the medication and eat/not eat, or take with other medications but no changes.  Daughter states that she refuses to take it anymore, and has not taken it the last few days. Her other doctors suggested that they cut it to a 25 mg tablet-  Dr.Freeman (with [facility] making housecall's) states they will follow up with the change but did not want to make any changes without Dr.Christophers agreement.  Advised daughter I would send message to MD to advise.

## 2018-12-27 NOTE — Telephone Encounter (Signed)
Daughter called back and gave fax number- I sent fax via Epic, attn to [place].

## 2018-12-27 NOTE — Telephone Encounter (Signed)
Called and spoke to patients daughter-  She will get fax number from [place] so I can send this over for medication changes. Advised her to just give the information to who answered the call and I would be glad to send it over.   Patient made appointment on 10/26 for follow up.

## 2018-12-27 NOTE — Telephone Encounter (Signed)
New message:     Patient daughter calling concerning her mother taking spironolactone 50 mg. Please call patient daughter concering this medication it is making patient sick.

## 2019-01-08 DIAGNOSIS — K219 Gastro-esophageal reflux disease without esophagitis: Secondary | ICD-10-CM | POA: Diagnosis not present

## 2019-01-08 DIAGNOSIS — D3705 Neoplasm of uncertain behavior of pharynx: Secondary | ICD-10-CM | POA: Diagnosis not present

## 2019-01-08 DIAGNOSIS — R070 Pain in throat: Secondary | ICD-10-CM | POA: Diagnosis not present

## 2019-01-10 DIAGNOSIS — J36 Peritonsillar abscess: Secondary | ICD-10-CM | POA: Diagnosis not present

## 2019-01-10 DIAGNOSIS — J449 Chronic obstructive pulmonary disease, unspecified: Secondary | ICD-10-CM | POA: Diagnosis not present

## 2019-01-10 DIAGNOSIS — I509 Heart failure, unspecified: Secondary | ICD-10-CM | POA: Diagnosis not present

## 2019-01-10 DIAGNOSIS — G4700 Insomnia, unspecified: Secondary | ICD-10-CM | POA: Diagnosis not present

## 2019-01-14 ENCOUNTER — Ambulatory Visit: Payer: Medicare Other | Admitting: Cardiology

## 2019-01-14 ENCOUNTER — Encounter: Payer: Self-pay | Admitting: Cardiology

## 2019-01-14 ENCOUNTER — Other Ambulatory Visit: Payer: Self-pay

## 2019-01-14 VITALS — BP 142/70 | HR 90 | Temp 97.6°F | Ht 63.0 in | Wt 115.0 lb

## 2019-01-14 DIAGNOSIS — Z87898 Personal history of other specified conditions: Secondary | ICD-10-CM | POA: Diagnosis not present

## 2019-01-14 DIAGNOSIS — I5032 Chronic diastolic (congestive) heart failure: Secondary | ICD-10-CM | POA: Diagnosis not present

## 2019-01-14 DIAGNOSIS — I272 Pulmonary hypertension, unspecified: Secondary | ICD-10-CM

## 2019-01-14 DIAGNOSIS — Z79899 Other long term (current) drug therapy: Secondary | ICD-10-CM | POA: Diagnosis not present

## 2019-01-14 NOTE — Progress Notes (Signed)
Cardiology Office Note:    Date:  01/14/2019   ID:  Tina Bailey, DOB 10/27/1945, MRN [id number]  PCP:  Roetta Sessions, NP  Cardiologist:  Buford Dresser, MD PhD  Referring MD: Midge Minium, MD   CC: follow up medication concerns  History of Present Illness:    Tina Bailey is a 73 y.o. female with a hx of hepatocellular carcinoma s/p right hepatectomy 2016, chronic joint pain due to arthritis, history of tobacco abuse (~50 years), COPD who is seen in follow up today. She was initially seen as a new patient to me (last seen by Dr. Oval Linsey in 2016 for preop clearance) at the request of Midge Minium, MD for the evaluation and management of chronic diastolic heart failure on 04/13/18.  Cardiac history: She was admitted for LE edema in 02/2018. She had several fractures (femur, sternum) in the fall and noted several weeks after that she developed LE edema. Doppler negative for DVT. Echo done during that hospitalization was notable for grade 1 diastolic dysfunction, severe pulmonary hypertension, and right atrial mass. We reviewed today. She is adamant that she would never want (and would likely not be a candidate for) any cardiothoracic surgery. Her swelling improved with diuresis. She diuresed well in the hospital with lasix, but she is allergic to sulfa. She had a rash and itching which she attributed to lasix, but it did not improve once lasix was stopped. Changed to spironolactone, itching has not changed. Swelling much improved, only sock lines.   Imaging that admission also noted incidental TAA, nonruptured. Had grandmother with ruputured aortic aneurysm. Again states that she would not want CT surgery even if she was a candidate. Not indicated now given the size, but we discussed routine monitoring, declined.  Today: Reviewed phone conversation from 12/27/18. Per patient's daughter, they were having nausea with the spironolactone. This was being used  for swelling. Patient stopped medication on 10/8. She is a resident of [facility].   Daughter [name] included via conference call today. Spironolactone stopped 10/8. Since then nausea has resolved. Has not had significant swelling or shortness of breath since this was stopped. Given her prior pulmonary pressures (PASP of 60 mmHg), it is a pleasant surprise that she has not had swelling on no diuretics.   Not smoking since January, congratulated on this.  Is weighed monthly at her facility. Discussed that if there is swelling seen consistently, would weight more frequently to monitor fluid level. Also discussed compression stockings, leg elevation today.   Denies chest pain, shortness of breath at rest or with normal exertion. No PND, orthopnea, LE edema or unexpected weight gain. No syncope or palpitations.  Past Medical History:  Diagnosis Date  . Arthritis    DDD. Right shoulder"is frozen"-limited ROM. osteoporosis.  . Blue toe syndrome ([place]) 11/27/2014   Dr. Claudia Pollock evaluating  . Cancer ([place]) 12/2014   liver cancer  . Complication of anesthesia   . COPD (chronic obstructive pulmonary disease) ([place])   . Fibromyalgia   . Fracture of rib of right side    hx "osteoporosis"- states her dog nudge her on the side, next day developed great pain and was told has a fracture rib.  . Macular degeneration    R eye  . Osteoporosis   . PONV (postoperative nausea and vomiting)    nausea,severe vomiting after 01-13-15 portal vein embolization  . Productive cough   . Retina disorder    L eye, vision distorted, edema  . Spine  fracture due to birth trauma   . TMJ disease   . Wears glasses     Past Surgical History:  Procedure Laterality Date  . ANGIOPLASTY  2008   no stents required, no follow-up with cardiologist, no recurrent chest pain  . CATARACT EXTRACTION, BILATERAL Bilateral   . ESOPHAGOGASTRODUODENOSCOPY (EGD) WITH PROPOFOL N/A 06/25/2015   Procedure: ESOPHAGOGASTRODUODENOSCOPY (EGD)  WITH PROPOFOL;  Surgeon: Milus Banister, MD;  Location: [facility];  Service: Endoscopy;  Laterality: N/A;  stent removal   . EYE SURGERY     cornea surgery  . INTRAMEDULLARY (IM) NAIL INTERTROCHANTERIC Right 12/04/2017   Procedure: INTRAMEDULLARY (IM) NAIL INTERTROCHANTRIC;  Surgeon: Rod Can, MD;  Location: [facility];  Service: Orthopedics;  Laterality: Right;  . IR RADIOLOGIST EVAL & MGMT  03/29/2017  . KIDNEY STONE SURGERY    . LAPAROSCOPIC PARTIAL HEPATECTOMY N/A 03/04/2015   Procedure: DIAGNOSTIC LAPAROSCOPY, EXTENDED RIGHT HEPATECTOMY, WITH INTRAOPERATIVE ULTRASOUND;  Surgeon: Stark Klein, MD;  Location: [facility];  Service: General;  Laterality: N/A;  . PARTIAL HYSTERECTOMY    . portal vein embolization     01-13-15 -Dr. Cathlean Sauer.    Current Medications: Current Outpatient Medications on File Prior to Visit  Medication Sig  . acetaminophen (TYLENOL) 325 MG tablet Take 2 tablets (650 mg total) by mouth every 6 (six) hours as needed for mild pain (or Fever >/= 101).  Marland Kitchen ALPRAZolam (XANAX) 0.25 MG tablet Take 1 tablet (0.25 mg total) by mouth 3 (three) times daily as needed for anxiety.  . bisacodyl (DULCOLAX) 10 MG suppository Place 1 suppository (10 mg total) rectally daily as needed for moderate constipation.  . busPIRone (BUSPAR) 10 MG tablet   . Calcium Carbonate-Vitamin D (CALCIUM 500+D HIGH POTENCY PO) Take 1 tablet by mouth daily as needed.   . Cholecalciferol (VITAMIN D3) 5000 units CAPS Take 5,000 Units by mouth daily as needed.   . DULoxetine (CYMBALTA) 30 MG capsule Take 1 tablet by mouth daily.  . famotidine (PEPCID) 20 MG tablet Take 20 mg by mouth every evening.  . feeding supplement, ENSURE ENLIVE, (ENSURE ENLIVE) LIQD Take 237 mLs by mouth 2 (two) times daily between meals. (Patient taking differently: Take 1 Bottle by mouth daily. )  . fluticasone (FLONASE) 50 MCG/ACT nasal spray Place 2 sprays into both nostrils daily.  . fluticasone (FLONASE) 50 MCG/ACT  nasal spray Flonase  . Glycopyrrolate-Formoterol (BEVESPI AEROSPHERE) 9-4.8 MCG/ACT AERO Inhale 2 puffs into the lungs 2 (two) times daily.  Marland Kitchen guaiFENesin (MUCINEX) 600 MG 12 hr tablet Take 2 tablets (1,200 mg total) by mouth 2 (two) times daily as needed.  Marland Kitchen HYDROcodone-acetaminophen (NORCO/VICODIN) 5-325 MG tablet Take 1 tablet by mouth 3 (three) times daily as needed.  Marland Kitchen ipratropium-albuterol (DUONEB) 0.5-2.5 (3) MG/3ML SOLN Take 3 mLs by nebulization every 4 (four) hours as needed (SOB).   . metaxalone (SKELAXIN) 800 MG tablet Take 800 mg by mouth as needed for muscle spasms.  . Multiple Vitamins-Minerals (CENTRUM SILVER ULTRA WOMENS PO) Take 1 tablet by mouth daily.   . mupirocin ointment (BACTROBAN) 2 % Apply 1 application topically 2 (two) times daily.  . polyethylene glycol (MIRALAX / GLYCOLAX) packet Take 17 g by mouth daily as needed.  . promethazine (PHENERGAN) 12.5 MG tablet Take 12.5 mg by mouth every 6 (six) hours as needed for nausea or vomiting.  . senna-docusate (SENOKOT-S) 8.6-50 MG tablet Take 1 tablet by mouth 2 (two) times daily as needed for mild constipation.  . thiamine 100  MG tablet Take 1 tablet (100 mg total) by mouth daily. (Patient taking differently: Take 100 mg by mouth daily as needed. )  . vitamin B-12 (CYANOCOBALAMIN) 1000 MCG tablet Take 1,000 mcg by mouth daily as needed.    No current facility-administered medications on file prior to visit.      Allergies:   Sertraline hcl, Amoxicillin-pot clavulanate, Clindamycin/lincomycin, Paroxetine hcl, and Sulfa antibiotics   Social History   Tobacco Use  . Smoking status: Former Smoker    Packs/day: 0.50    Years: 45.00    Pack years: 22.50    Types: Cigarettes    Quit date: 02/20/2018    Years since quitting: 0.8  . Smokeless tobacco: Never Used  Substance Use Topics  . Alcohol use: No    Alcohol/week: 0.0 standard drinks  . Drug use: No    Family History: The patient's family history includes Atrial  fibrillation in her mother; Dementia in her father; Heart attack in her brother; Heart disease in her brother and mother; Hypertension in her mother; Stroke in her father.  ROS:   Please see the history of present illness.  Additional pertinent ROS: Constitutional: Negative for chills, fever, night sweats, unintentional weight loss  HENT: Negative for ear pain and hearing loss.   Eyes: Negative for loss of vision and eye pain.  Respiratory: Negative for cough, sputum, wheezing.   Cardiovascular: See HPI. Gastrointestinal: Negative for abdominal pain, melena, and hematochezia.  Genitourinary: Negative for dysuria and hematuria.  Musculoskeletal: Negative for falls and myalgias.  Skin: Negative for itching and rash.  Neurological: Negative for focal weakness, focal sensory changes and loss of consciousness.  Endo/Heme/Allergies: Does not bruise/bleed easily.   EKGs/Labs/Other Studies Reviewed:    The following studies were reviewed today: Echo 03/20/18 - Left ventricle: The cavity size was normal. Systolic function was   normal. The estimated ejection fraction was in the range of 55%   to 60%. Wall motion was normal; there were no regional wall   motion abnormalities. Doppler parameters are consistent with   abnormal left ventricular relaxation (grade 1 diastolic   dysfunction). - Right ventricle: The cavity size was mildly dilated. Wall   thickness was normal. Systolic function was mildly reduced. - Right atrium: There was a mass in the atrial cavity adjacent to   the intra-atrial septum. - Tricuspid valve: There was moderate regurgitation. - Pulmonary arteries: Systolic pressure was severely increased. PA   peak pressure: 60 mm Hg (S). - Pericardium, extracardiac: A small pericardial effusion was   identified. There is mitral inflow respiratory variability, but   no clear RA/RV collapse.  Impressions:  - Normal LV EF, grade 1 diastolic dysfunction.   There is a structure in  the right atrial cavity adjacent to the   intra-atrial septum that cannot be well defined. Could be very   hypertrophied septum, though favor other etiology, cannot exclude   thrombus, myxoma, or other.   Severe pulmonary hypertension, with mild RV dysfunction and   dilation.   Small pericardial effusion, predominantly posterior. There is   mitral inflow variability, though this may be unrelated to   effusion. No RA/RV collapse seen.  Arterial duplex 02/20/18 Right: Total occlusion noted in the superficial femoral artery with reconstitution in the popliteal artery.  ABI 02/20/18 Right: Resting right ankle-brachial index indicates moderate right lower extremity arterial disease. The right toe-brachial index is abnormal.  Left: Resting left ankle-brachial index indicates mild left lower extremity arterial disease. The left toe-brachial index  is abnormal.  Nuclear stress 01/12/15  Nuclear stress EF: 67%.  There was no ST segment deviation noted during stress.  The study is normal.  This is a low risk study.  The left ventricular ejection fraction is hyperdynamic (>65%).  EKG:  EKG is personally reviewed.  The ekg ordered 04/13/18 demonstrates normal sinus rhythm, right axis deviation.  Recent Labs: 02/27/2018: Pro B Natriuretic peptide (BNP) 749.0 03/01/2018: TSH 1.262 03/04/2018: Magnesium 2.2 03/05/2018: B Natriuretic Peptide 442.9 12/18/2018: ALT 10; BUN 12; Creatinine, Ser 0.71; Hemoglobin 14.1; Platelets 248; Potassium 4.7; Sodium 124  Recent Lipid Panel    Component Value Date/Time   CHOL 188 01/09/2017 1109   TRIG 71.0 01/09/2017 1109   HDL 91.40 01/09/2017 1109   CHOLHDL 2 01/09/2017 1109   VLDL 14.2 01/09/2017 1109   LDLCALC 82 01/09/2017 1109    Physical Exam:    VS:  BP (!) 142/70 (BP Location: Left Arm, Patient Position: Sitting, Cuff Size: Normal)   Pulse 90   Temp 97.6 F (36.4 C)   Ht 5\' 3"  (1.6 m)   Wt 115 lb (52.2 kg)   BMI 20.37 kg/m     Wt  Readings from Last 3 Encounters:  01/14/19 115 lb (52.2 kg)  12/18/18 115 lb 6.4 oz (52.3 kg)  12/10/18 114 lb (51.7 kg)    GEN: pleasant, frail elderly woman in no acute distress. Kyphotic spine, protruding sternum chronically.  HEENT: Normal, moist mucous membranes NECK: No JVD CARDIAC: regular rhythm, normal S1 and S2, no rubs or gallops. No murmurs. VASCULAR: Radial and DP pulses 2+ bilaterally. No carotid bruits RESPIRATORY:  No rales, mild end expiratory wheezing, distant breath sounds. ABDOMEN: Soft, non-tender, non-distended MUSCULOSKELETAL:  Ambulates independently SKIN: Warm and dry, trivial bilateral LE edema NEUROLOGIC:  Alert and oriented x 3. No focal neuro deficits noted. PSYCHIATRIC:  Normal affect   ASSESSMENT:    1. Chronic diastolic heart failure (HCC)   2. Pulmonary hypertension, unspecified ([place])   3. Medication management   4. History of peripheral edema    PLAN:    Chronic diastolic heart failure, severe pulmonary hypertension on echo, history of severe bilateral LE edema:  -had nausea on spironolactone. Since being held on 10/8, nausea had resolved. Surprisingly, her LE edema has not recurred despite >2 weeks off diuretics. She has a sulfa allergy, so we have limited options for diuretics if LE edema recurs. -discussed with patient and her daughter (via conference call) today:  -if mild LE edema (sock lines), elevate feet  -if persistent LE edema more than sock lines, use compression stockings  -if LE edema despite compression stockings, or if SOB or weight going up, take 12.5 mg spironolactone daily until symptoms resolve -they will call if symptoms develop or become more frequent.  -continue tobacco abstinence -has previously endorsed that she does not want invasive testing (such as right heart cath) or surgery  Thoracic aortic aneurysm: as previously noted, she would not want surgery, does not wish to follow for monitoring  Plan for follow up: 6 mos or  sooner PRN  TIME SPENT WITH PATIENT: 27 minutes of direct patient care. More than 50% of that time was spent on coordination of care and counseling regarding medication management, plan for edema.  Buford Dresser, MD, PhD [place]  [facility]   Medication Adjustments/Labs and Tests Ordered: Current medicines are reviewed at length with the patient today.  Concerns regarding medicines are outlined above.  No orders of the defined types  were placed in this encounter.  No orders of the defined types were placed in this encounter.   Patient Instructions  Medication Instructions:  Your Physician recommend you continue on your current medication as directed.    *If you need a refill on your cardiac medications before your next appointment, please call your pharmacy*  Lab Work: None  Testing/Procedures: None  Follow-Up: At [facility], you and your health needs are our priority.  As part of our continuing mission to provide you with exceptional heart care, we have created designated Provider Care Teams.  These Care Teams include your primary Cardiologist (physician) and Advanced Practice Providers (APPs -  Physician Assistants and Nurse Practitioners) who all work together to provide you with the care you need, when you need it.  Your next appointment:   6 months  The format for your next appointment:   In Person  Provider:   Buford Dresser, MD      Signed, Buford Dresser, MD PhD 01/14/2019 4:59 PM    [place]

## 2019-01-14 NOTE — Patient Instructions (Signed)

## 2019-01-17 DIAGNOSIS — M545 Low back pain: Secondary | ICD-10-CM | POA: Diagnosis not present

## 2019-01-17 DIAGNOSIS — G4700 Insomnia, unspecified: Secondary | ICD-10-CM | POA: Diagnosis not present

## 2019-01-17 DIAGNOSIS — I5032 Chronic diastolic (congestive) heart failure: Secondary | ICD-10-CM | POA: Diagnosis not present

## 2019-01-17 DIAGNOSIS — Z79899 Other long term (current) drug therapy: Secondary | ICD-10-CM | POA: Diagnosis not present

## 2019-01-24 DIAGNOSIS — G4700 Insomnia, unspecified: Secondary | ICD-10-CM | POA: Diagnosis not present

## 2019-01-24 DIAGNOSIS — I5032 Chronic diastolic (congestive) heart failure: Secondary | ICD-10-CM | POA: Diagnosis not present

## 2019-01-24 DIAGNOSIS — M545 Low back pain: Secondary | ICD-10-CM | POA: Diagnosis not present

## 2019-01-24 DIAGNOSIS — Z79899 Other long term (current) drug therapy: Secondary | ICD-10-CM | POA: Diagnosis not present

## 2019-01-25 DIAGNOSIS — Z79899 Other long term (current) drug therapy: Secondary | ICD-10-CM | POA: Diagnosis not present

## 2019-01-25 DIAGNOSIS — N390 Urinary tract infection, site not specified: Secondary | ICD-10-CM | POA: Diagnosis not present

## 2019-01-29 DIAGNOSIS — M5416 Radiculopathy, lumbar region: Secondary | ICD-10-CM | POA: Diagnosis not present

## 2019-02-06 DIAGNOSIS — B351 Tinea unguium: Secondary | ICD-10-CM | POA: Diagnosis not present

## 2019-02-06 DIAGNOSIS — I739 Peripheral vascular disease, unspecified: Secondary | ICD-10-CM | POA: Diagnosis not present

## 2019-02-06 DIAGNOSIS — M546 Pain in thoracic spine: Secondary | ICD-10-CM | POA: Diagnosis not present

## 2019-02-07 DIAGNOSIS — S22070D Wedge compression fracture of T9-T10 vertebra, subsequent encounter for fracture with routine healing: Secondary | ICD-10-CM | POA: Diagnosis not present

## 2019-02-07 DIAGNOSIS — G4700 Insomnia, unspecified: Secondary | ICD-10-CM | POA: Diagnosis not present

## 2019-02-07 DIAGNOSIS — S22080D Wedge compression fracture of T11-T12 vertebra, subsequent encounter for fracture with routine healing: Secondary | ICD-10-CM | POA: Diagnosis not present

## 2019-02-08 DIAGNOSIS — R070 Pain in throat: Secondary | ICD-10-CM | POA: Diagnosis not present

## 2019-02-08 DIAGNOSIS — D3705 Neoplasm of uncertain behavior of pharynx: Secondary | ICD-10-CM | POA: Diagnosis not present

## 2019-02-08 DIAGNOSIS — K219 Gastro-esophageal reflux disease without esophagitis: Secondary | ICD-10-CM | POA: Diagnosis not present

## 2019-02-17 IMAGING — CT CT ABD-PELV W/O CM
2 of 3 series · 13 of 32 positions shown, 19 images · non-contrast
Comparison: None.

CLINICAL DATA: Right side and mid abdomen pain. Chronic
constipation. History of partial hepatectomy for liver cancer.

EXAM:
CT ABDOMEN AND PELVIS WITHOUT CONTRAST
TECHNIQUE: Multidetector CT imaging of the abdomen and pelvis was performed
following the standard protocol without IV contrast.

[Series 2: abd/pelvis w/(date) · axial · 0.57mm/px · z∈[-346,-76]mm · 10 of 67 slices shown, 16 images]
[im 7/67  soft-tissue]
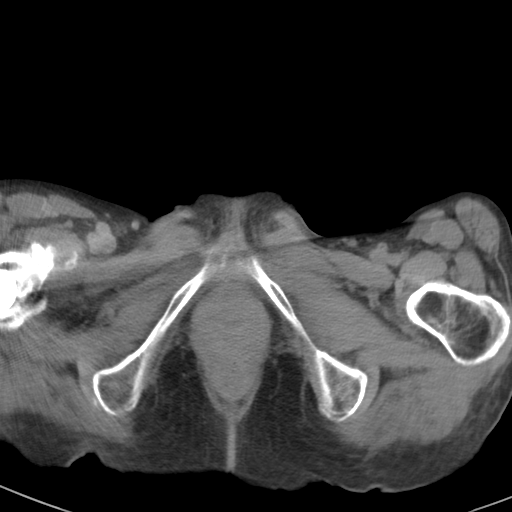
[im 7/67  bone]
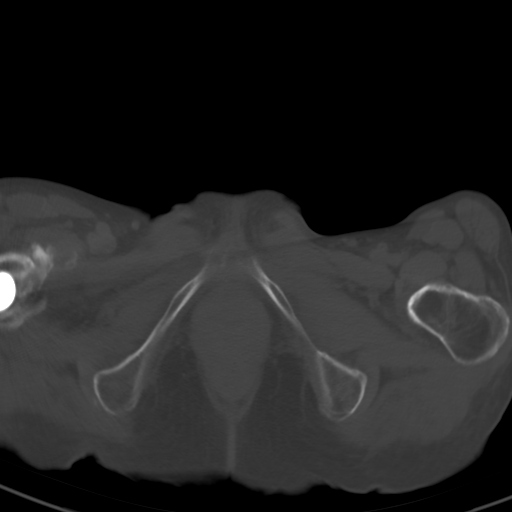
[im 13/67  soft-tissue]
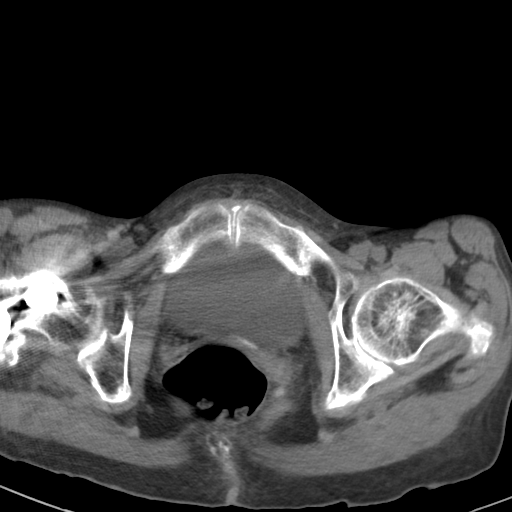
[im 19/67  soft-tissue]
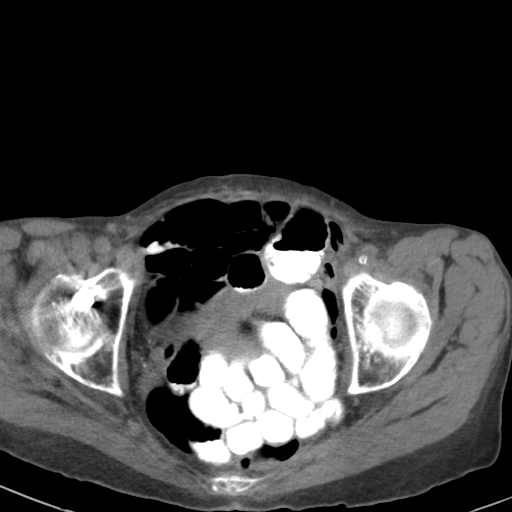
[im 25/67  soft-tissue]
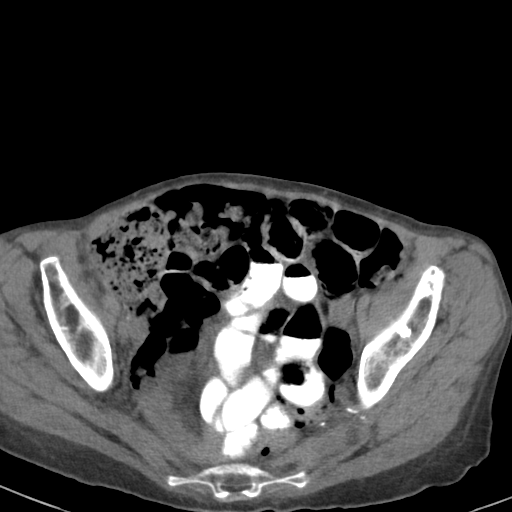
[im 31/67  soft-tissue]
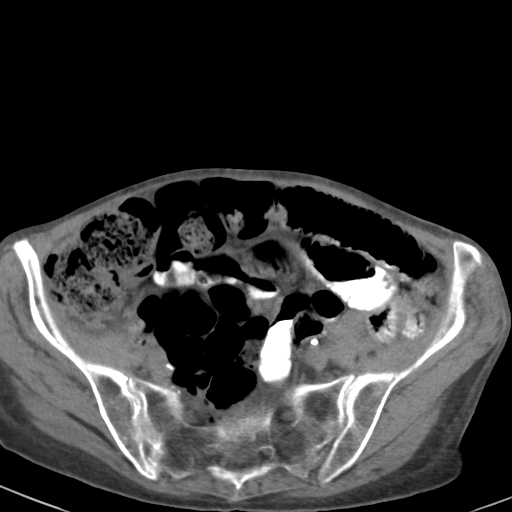
[im 37/67  soft-tissue]
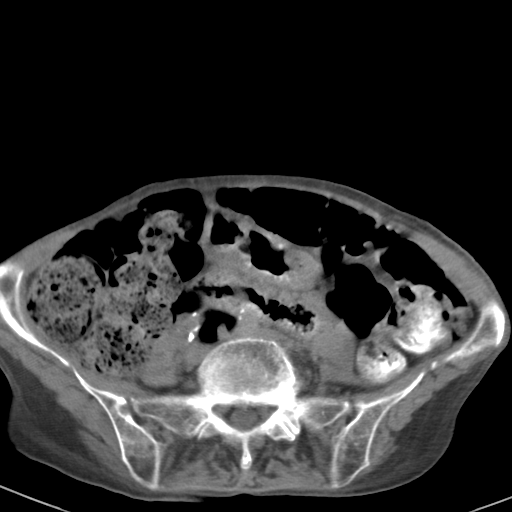
[im 43/67  soft-tissue]
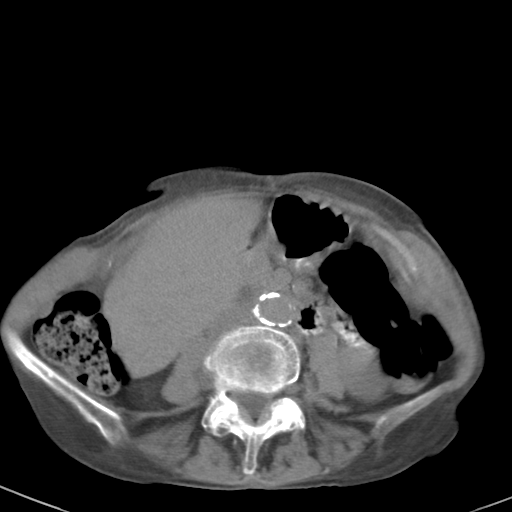
[im 43/67  lung]
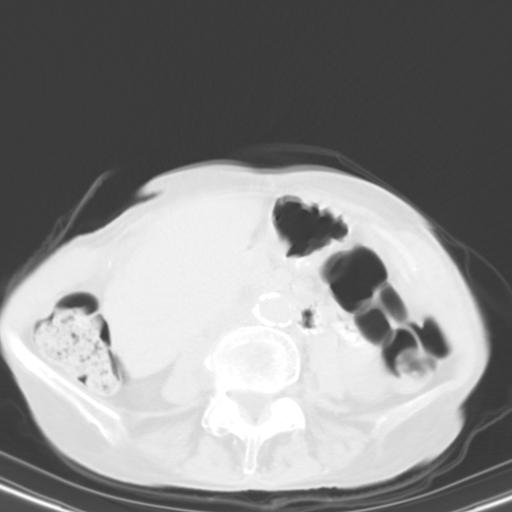
[im 49/67  soft-tissue]
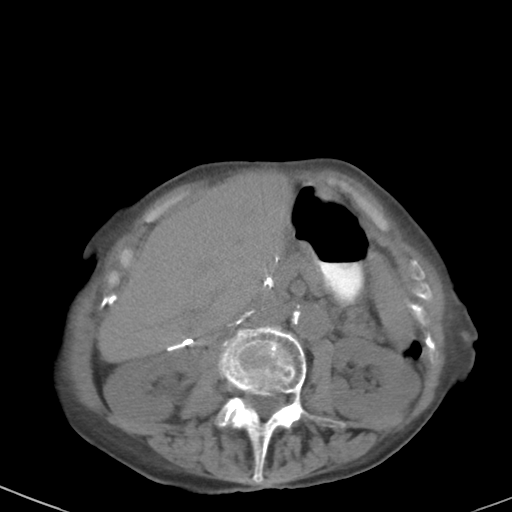
[im 49/67  lung]
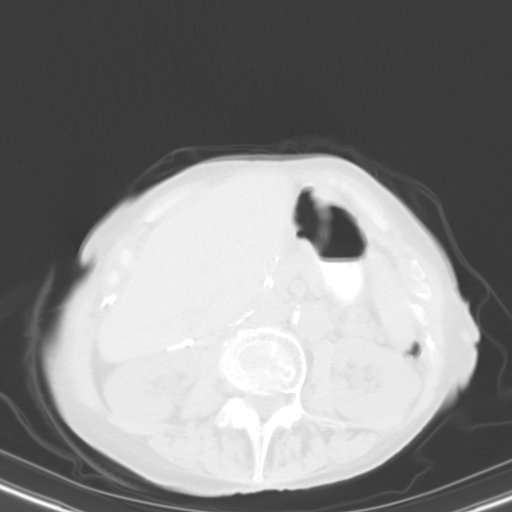
[im 55/67  soft-tissue]
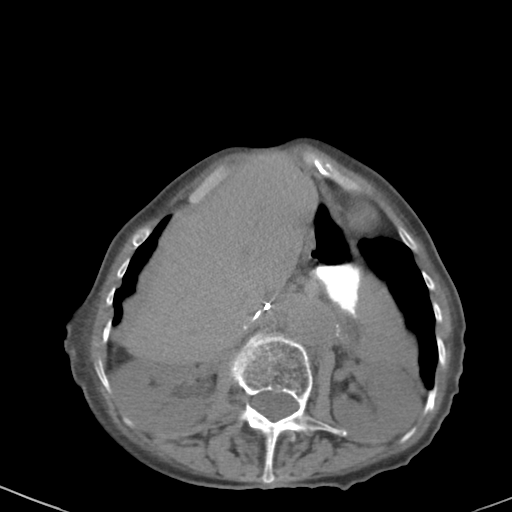
[im 55/67  lung]
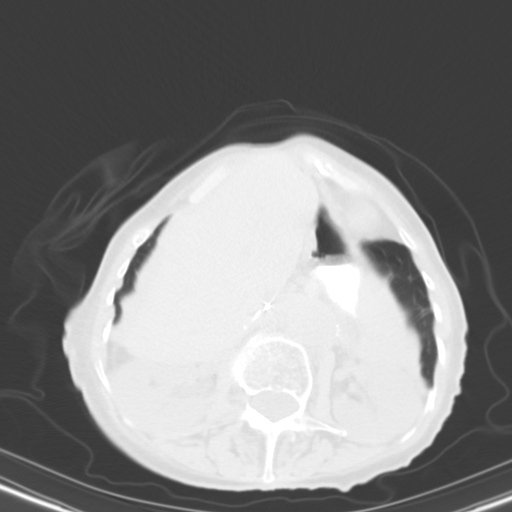
[im 55/67  bone]
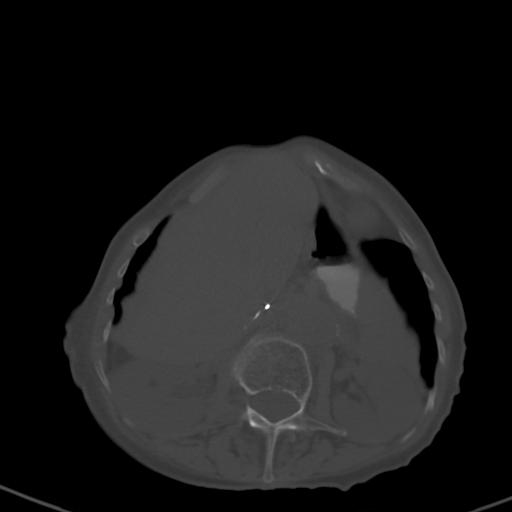
[im 61/67  soft-tissue]
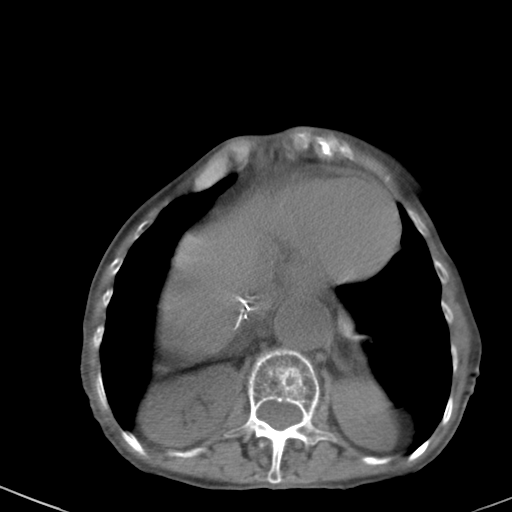
[im 61/67  lung]
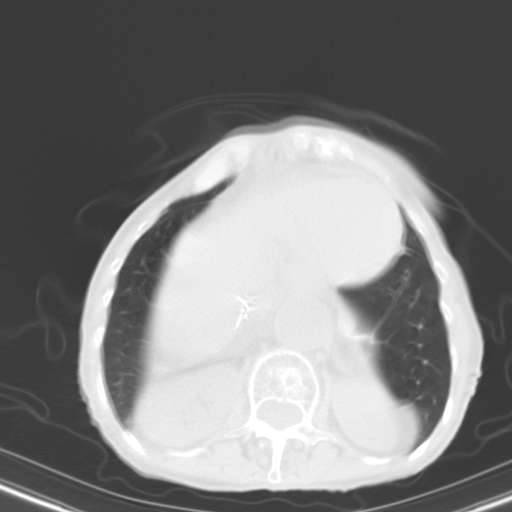

[Series 4: lung · axial · 0.45mm/px · z∈[-122,-98]mm · 3 of 45 slices shown]
[im 7/45  bone]
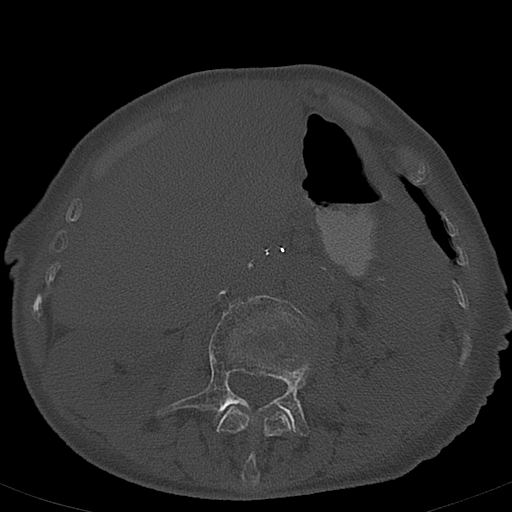
[im 13/45  bone]
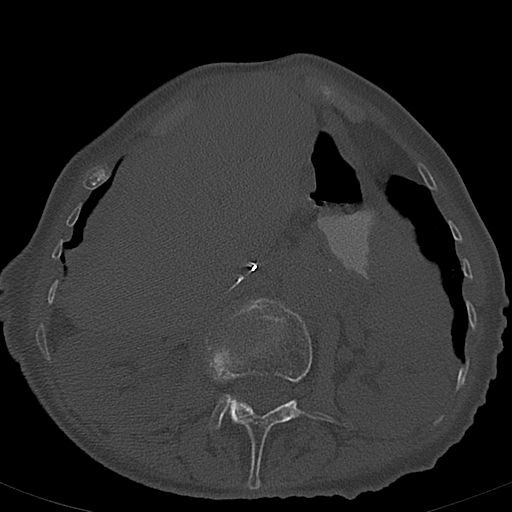
[im 19/45  bone]
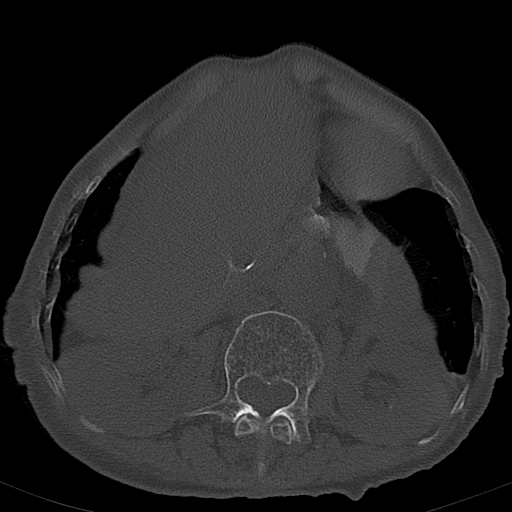

[13 of 32 positions shown; findings below may reference images not displayed]

FINDINGS: Lower chest: Minimal atelectasis of the anterior left lung base is
noted. The heart size is normal.

Hepatobiliary: Status post prior partial hepatectomy. Evaluation for
focal liver lesion is limited without contrast. The gallbladder is
not seen presumably surgically removed. The biliary tree is
unremarkable.

Pancreas: Unremarkable. No pancreatic ductal dilatation or
surrounding inflammatory changes.

Spleen: Normal in size without focal abnormality.

Adrenals/Urinary Tract: There are nonobstructing stones within the
left kidney. There are bilateral parapelvic renal cysts identified.
Due to the prior surgical changes and extensive atherosclerosis
evaluation for stone within the ureter is difficult. The bladder is
normal.

Stomach/Bowel: There are mild dilated air and contrast filled small
bowel loops in the mid and lower left abdomen. Extensive bowel
content is identified throughout the colon.

Vascular/Lymphatic: Aortic atherosclerosis. No enlarged abdominal or
pelvic lymph nodes.

Reproductive: Status post hysterectomy. No adnexal masses.

Other: None.

Musculoskeletal: Chronic compression deformities are noted
throughout lumbar vertebral bodies. Degenerative joint changes are
noted.
IMPRESSION: Mild dilated air and contrast filled small bowel loops in the mid
and lower left abdomen. Extensive bowel content is identified
throughout the colon. The findings may be due to ileus.

## 2019-02-19 ENCOUNTER — Encounter (HOSPITAL_COMMUNITY): Payer: Self-pay | Admitting: Emergency Medicine

## 2019-02-19 ENCOUNTER — Emergency Department (HOSPITAL_COMMUNITY)
Admission: EM | Admit: 2019-02-19 | Discharge: 2019-02-20 | Disposition: A | Discharge status: Home / Self Care | Payer: Medicare Other | Attending: Emergency Medicine | Admitting: Emergency Medicine

## 2019-02-19 ENCOUNTER — Other Ambulatory Visit: Payer: Self-pay

## 2019-02-19 DIAGNOSIS — Z87891 Personal history of nicotine dependence: Secondary | ICD-10-CM | POA: Insufficient documentation

## 2019-02-19 DIAGNOSIS — R000 Tachycardia, unspecified: Secondary | ICD-10-CM | POA: Diagnosis not present

## 2019-02-19 DIAGNOSIS — R519 Headache, unspecified: Secondary | ICD-10-CM | POA: Diagnosis not present

## 2019-02-19 DIAGNOSIS — Z8505 Personal history of malignant neoplasm of liver: Secondary | ICD-10-CM | POA: Diagnosis not present

## 2019-02-19 DIAGNOSIS — I5032 Chronic diastolic (congestive) heart failure: Secondary | ICD-10-CM | POA: Insufficient documentation

## 2019-02-19 DIAGNOSIS — R1084 Generalized abdominal pain: Secondary | ICD-10-CM | POA: Diagnosis not present

## 2019-02-19 DIAGNOSIS — R110 Nausea: Secondary | ICD-10-CM | POA: Diagnosis not present

## 2019-02-19 DIAGNOSIS — R10817 Generalized abdominal tenderness: Secondary | ICD-10-CM | POA: Insufficient documentation

## 2019-02-19 DIAGNOSIS — I10 Essential (primary) hypertension: Secondary | ICD-10-CM | POA: Diagnosis not present

## 2019-02-19 DIAGNOSIS — G8929 Other chronic pain: Secondary | ICD-10-CM | POA: Insufficient documentation

## 2019-02-19 DIAGNOSIS — J449 Chronic obstructive pulmonary disease, unspecified: Secondary | ICD-10-CM | POA: Insufficient documentation

## 2019-02-19 DIAGNOSIS — R112 Nausea with vomiting, unspecified: Secondary | ICD-10-CM | POA: Diagnosis not present

## 2019-02-19 DIAGNOSIS — Z79899 Other long term (current) drug therapy: Secondary | ICD-10-CM | POA: Insufficient documentation

## 2019-02-19 DIAGNOSIS — Z743 Need for continuous supervision: Secondary | ICD-10-CM | POA: Diagnosis not present

## 2019-02-19 HISTORY — DX: Heart failure, unspecified: I50.9

## 2019-02-19 LAB — CBC WITH DIFFERENTIAL/PLATELET
Abs Immature Granulocytes: 0.02 10*3/uL (ref 0.00–0.07)
Basophils Absolute: 0 10*3/uL (ref 0.0–0.1)
Basophils Relative: 0 %
Eosinophils Absolute: 0 10*3/uL (ref 0.0–0.5)
Eosinophils Relative: 0 %
HCT: 46.5 % — ABNORMAL HIGH (ref 36.0–46.0)
Hemoglobin: 14.7 g/dL (ref 12.0–15.0)
Immature Granulocytes: 0 %
Lymphocytes Relative: 6 %
Lymphs Abs: 0.5 10*3/uL — ABNORMAL LOW (ref 0.7–4.0)
MCH: 30.6 pg (ref 26.0–34.0)
MCHC: 31.6 g/dL (ref 30.0–36.0)
MCV: 96.7 fL (ref 80.0–100.0)
Monocytes Absolute: 0.2 10*3/uL (ref 0.1–1.0)
Monocytes Relative: 2 %
Neutro Abs: 7.6 10*3/uL (ref 1.7–7.7)
Neutrophils Relative %: 92 %
Platelets: 286 10*3/uL (ref 150–400)
RBC: 4.81 MIL/uL (ref 3.87–5.11)
RDW: 12.6 % (ref 11.5–15.5)
WBC: 8.3 10*3/uL (ref 4.0–10.5)
nRBC: 0 % (ref 0.0–0.2)

## 2019-02-19 LAB — URINALYSIS, ROUTINE W REFLEX MICROSCOPIC
Bilirubin Urine: NEGATIVE
Glucose, UA: NEGATIVE mg/dL
Ketones, ur: 20 mg/dL — AB
Leukocytes,Ua: NEGATIVE
Nitrite: NEGATIVE
Protein, ur: NEGATIVE mg/dL
Specific Gravity, Urine: 1.006 (ref 1.005–1.030)
pH: 8 (ref 5.0–8.0)

## 2019-02-19 LAB — COMPREHENSIVE METABOLIC PANEL
ALT: 36 U/L (ref 0–44)
AST: 38 U/L (ref 15–41)
Albumin: 3.9 g/dL (ref 3.5–5.0)
Alkaline Phosphatase: 121 U/L (ref 38–126)
Anion gap: 13 (ref 5–15)
BUN: 10 mg/dL (ref 8–23)
CO2: 26 mmol/L (ref 22–32)
Calcium: 9 mg/dL (ref 8.9–10.3)
Chloride: 93 mmol/L — ABNORMAL LOW (ref 98–111)
Creatinine, Ser: 0.51 mg/dL (ref 0.44–1.00)
GFR calc Af Amer: >60 mL/min (ref >60)
GFR calc non Af Amer: >60 mL/min (ref >60)
Glucose, Bld: 133 mg/dL — ABNORMAL HIGH (ref 70–99)
Potassium: 4.7 mmol/L (ref 3.5–5.1)
Sodium: 132 mmol/L — ABNORMAL LOW (ref 135–145)
Total Bilirubin: 0.9 mg/dL (ref 0.3–1.2)
Total Protein: 7 g/dL (ref 6.5–8.1)

## 2019-02-19 LAB — LIPASE, BLOOD: Lipase: 19 U/L (ref 11–51)

## 2019-02-19 MED ORDER — HYDROMORPHONE HCL 1 MG/ML IJ SOLN: 1.0000 mg | Freq: Once | INTRAMUSCULAR | Status: DC

## 2019-02-19 MED ORDER — HYDROMORPHONE HCL 1 MG/ML IJ SOLN
2.0000 mg | Freq: Once | INTRAMUSCULAR | Status: AC
  Administered 2019-02-19: 2 mg via INTRAVENOUS

## 2019-02-19 MED ORDER — HYDROMORPHONE HCL 1 MG/ML IJ SOLN
2.0000 mg | Freq: Once | INTRAMUSCULAR | Status: DC
  Filled 2019-02-19: qty 2

## 2019-02-19 NOTE — ED Notes (Signed)
Pt O2 sat between 88% and 94%, pt refuses to be put on O2.

## 2019-02-19 NOTE — Discharge Instructions (Signed)
Please return to the ED or seek medical attention for any new or worsening symptoms.  Many cases can be observed and treated at home after initial evaluation in the emergency department. Even though you are being discharged home, abdominal pain can be unpredictable. Therefore, you need a repeat exam if your pain does not resolve, if it returns, or worsens. Most patient's with abdominal pain do not need to be admitted to the hospital or have surgery, but serious problems like appendicitis and gallbladder attacks can start out as nonspecific pain. Many abdominal conditions cannot be diagnosed in 1 visit, so followup evaluations are very important.  Please seek immediate care if you have any develop any of the following symptoms: The pain does not improve or gets worse.  You develop a fever or chills.  You keep throwing up (vomiting).  The pain is felt only in portions of the abdomen.  You pass bloody or black tarry stools.   You do not have a bowel movement for more than 4 days.  You have profuse (greater than 4/day), loose stools for more than 4 days.

## 2019-02-19 NOTE — ED Notes (Signed)
Pt placed on 3L O2 [place], O2 sat now 96%

## 2019-02-19 NOTE — ED Provider Notes (Signed)
[facility] EMERGENCY DEPARTMENT Provider Note   CSN: [id number] Arrival date & time: 02/19/19  1813     History   Chief Complaint Chief Complaint  Patient presents with  . Abdominal Pain  . Nausea    HPI Tina Bailey is a 73 y.o. female with PMH significant for fibromyalgia, chronic pain syndrome, CHF, arthritis, anxiety, and osteopenia who presents to the ED with nausea, vomiting, and a 36-month history of abdominal and flank pain.  Patient reportedly is followed by pain management clinic and was recently switched from oxycodone to Dilaudid.  She suspects that that may have contributed to her onset of lightheadedness and nausea when she woke up this morning.  Rather than subsiding, she continued to experience nausea and emesis, but she primarily presents to the ED for her ongoing abdominal pain.  EMS provide her with 4 mg Zofran.  She attests that she was not adequately explained the results of her recent imaging for her abdominal and back discomfort.  She also endorses a frontal headache, but nothing in comparison to her abdominal discomfort.  She denies any fevers or chills, neurologic deficits, chest pain or shortness of breath, dysuria, or change in bowel habits.     HPI  Past Medical History:  Diagnosis Date  . Arthritis    DDD. Right shoulder"is frozen"-limited ROM. osteoporosis.  . Blue toe syndrome ([place]) 11/27/2014   Dr. Claudia Pollock evaluating  . Cancer ([place]) 12/2014   liver cancer  . CHF (congestive heart failure) ([place])   . Complication of anesthesia   . COPD (chronic obstructive pulmonary disease) ([place])   . Fibromyalgia   . Fracture of rib of right side    hx "osteoporosis"- states her dog nudge her on the side, next day developed great pain and was told has a fracture rib.  . Macular degeneration    R eye  . Osteoporosis   . PONV (postoperative nausea and vomiting)    nausea,severe vomiting after 01-13-15 portal vein embolization  .  Productive cough   . Retina disorder    L eye, vision distorted, edema  . Spine fracture due to birth trauma   . TMJ disease   . Wears glasses     Patient Active Problem List   Diagnosis Date Noted  . History of peripheral edema 01/14/2019  . Skin picking habit 12/02/2018  . Thoracic aortic aneurysm without rupture ([place]) 04/13/2018  . Pulmonary hypertension, unspecified ([place]) 04/13/2018  . Bilateral leg edema 04/13/2018  . Severe episode of recurrent major depressive disorder, without psychotic features ([place]) 04/02/2018  . Acute on chronic diastolic CHF (congestive heart failure) ([place])   . Hyponatremia 02/28/2018  . COPD (chronic obstructive pulmonary disease) ([place]) 02/28/2018  . Tobacco abuse 02/28/2018  . Acute blood loss anemia 12/06/2017  . Thrombocytopenia ([place]) 12/06/2017  . Severe protein-calorie malnutrition ([place]) 12/05/2017  . Closed comminuted intertrochanteric fracture of proximal end of right femur ([place]) 12/04/2017  . Chronic thoracic back pain 03/31/2017  . Physical exam 01/09/2017  . Malnutrition of moderate degree 12/22/2016  . Anxiety about health 09/27/2016  . Osteoporosis 07/13/2016  . Chronic narcotic use 07/13/2016  . Constipation 04/26/2015  . Chronic pain 04/26/2015  . Depression with anxiety 04/26/2015  . COPD GOLD IV / still somking 03/03/2015  . Cigarette smoker 03/03/2015  . Hepatocellular carcinoma ([place]) 12/30/2014  . Pancreatic mass 12/04/2014  . Blue toe syndrome ([place]) 11/27/2014    Past Surgical History:  Procedure Laterality Date  . ANGIOPLASTY  2008   no stents required, no follow-up with cardiologist, no recurrent chest pain  . CATARACT EXTRACTION, BILATERAL Bilateral   . ESOPHAGOGASTRODUODENOSCOPY (EGD) WITH PROPOFOL N/A 06/25/2015   Procedure: ESOPHAGOGASTRODUODENOSCOPY (EGD) WITH PROPOFOL;  Surgeon: Milus Banister, MD;  Location: [facility];  Service: Endoscopy;  Laterality: N/A;  stent removal   . EYE SURGERY     cornea surgery  .  INTRAMEDULLARY (IM) NAIL INTERTROCHANTERIC Right 12/04/2017   Procedure: INTRAMEDULLARY (IM) NAIL INTERTROCHANTRIC;  Surgeon: Rod Can, MD;  Location: [facility];  Service: Orthopedics;  Laterality: Right;  . IR RADIOLOGIST EVAL & MGMT  03/29/2017  . KIDNEY STONE SURGERY    . LAPAROSCOPIC PARTIAL HEPATECTOMY N/A 03/04/2015   Procedure: DIAGNOSTIC LAPAROSCOPY, EXTENDED RIGHT HEPATECTOMY, WITH INTRAOPERATIVE ULTRASOUND;  Surgeon: Stark Klein, MD;  Location: [facility];  Service: General;  Laterality: N/A;  . PARTIAL HYSTERECTOMY    . portal vein embolization     01-13-15 -Dr. Cathlean Sauer.     OB History    Gravida  3   Para      Term      Preterm      AB      Living  3     SAB      TAB      Ectopic      Multiple      Live Births               Home Medications    Prior to Admission medications   Medication Sig Start Date End Date Taking? Authorizing Provider  acetaminophen (TYLENOL) 325 MG tablet Take 2 tablets (650 mg total) by mouth every 6 (six) hours as needed for mild pain (or Fever >/= 101). 03/06/18   Florencia Reasons, MD  ALPRAZolam Duanne Moron) 0.25 MG tablet Take 1 tablet (0.25 mg total) by mouth 3 (three) times daily as needed for anxiety. 06/27/18   Midge Minium, MD  bisacodyl (DULCOLAX) 10 MG suppository Place 1 suppository (10 mg total) rectally daily as needed for moderate constipation. 12/06/17   Debbe Odea, MD  busPIRone (BUSPAR) 10 MG tablet  08/25/18   [provider]  Calcium Carbonate-Vitamin D (CALCIUM 500+D HIGH POTENCY PO) Take 1 tablet by mouth daily as needed.     [provider]  Cholecalciferol (VITAMIN D3) 5000 units CAPS Take 5,000 Units by mouth daily as needed.     [provider]  DULoxetine (CYMBALTA) 30 MG capsule Take 1 tablet by mouth daily.    [provider]  famotidine (PEPCID) 20 MG tablet Take 20 mg by mouth every evening.    [provider]  feeding supplement, ENSURE ENLIVE, (ENSURE  ENLIVE) LIQD Take 237 mLs by mouth 2 (two) times daily between meals. Patient taking differently: Take 1 Bottle by mouth daily.  12/06/17   Debbe Odea, MD  fluticasone (FLONASE) 50 MCG/ACT nasal spray Place 2 sprays into both nostrils daily. 05/15/18   Brunetta Jeans, PA-C  fluticasone (FLONASE) 50 MCG/ACT nasal spray Flonase    [provider]  Glycopyrrolate-Formoterol (BEVESPI AEROSPHERE) 9-4.8 MCG/ACT AERO Inhale 2 puffs into the lungs 2 (two) times daily. 05/30/17   Tanda Rockers, MD  guaiFENesin (MUCINEX) 600 MG 12 hr tablet Take 2 tablets (1,200 mg total) by mouth 2 (two) times daily as needed. 04/10/18   Brunetta Jeans, PA-C  HYDROcodone-acetaminophen (NORCO/VICODIN) 5-325 MG tablet Take 1 tablet by mouth 3 (three) times daily as needed. 09/14/18   Annye Asa  E, MD  ipratropium-albuterol (DUONEB) 0.5-2.5 (3) MG/3ML SOLN Take 3 mLs by nebulization every 4 (four) hours as needed (SOB).     [provider]  metaxalone (SKELAXIN) 800 MG tablet Take 800 mg by mouth as needed for muscle spasms.    [provider]  Multiple Vitamins-Minerals (CENTRUM SILVER ULTRA WOMENS PO) Take 1 tablet by mouth daily.     [provider]  mupirocin ointment (BACTROBAN) 2 % Apply 1 application topically 2 (two) times daily. 09/26/18   Midge Minium, MD  polyethylene glycol (MIRALAX / Floria Raveling) packet Take 17 g by mouth daily as needed. 05/01/18   Midge Minium, MD  promethazine (PHENERGAN) 12.5 MG tablet Take 12.5 mg by mouth every 6 (six) hours as needed for nausea or vomiting.    [provider]  senna-docusate (SENOKOT-S) 8.6-50 MG tablet Take 1 tablet by mouth 2 (two) times daily as needed for mild constipation. 05/01/18   Midge Minium, MD  thiamine 100 MG tablet Take 1 tablet (100 mg total) by mouth daily. Patient taking differently: Take 100 mg by mouth daily as needed.  03/06/18   Florencia Reasons, MD  vitamin B-12 (CYANOCOBALAMIN) 1000 MCG  tablet Take 1,000 mcg by mouth daily as needed.     [provider]    Family History Family History  Problem Relation Age of Onset  . Hypertension Mother   . Atrial fibrillation Mother   . Heart disease Mother        after age 15  . Stroke Father   . Dementia Father   . Heart disease Brother        After age 53- A-Fib  . Heart attack Brother     Social History Social History   Tobacco Use  . Smoking status: Former Smoker    Packs/day: 0.50    Years: 45.00    Pack years: 22.50    Types: Cigarettes    Quit date: 02/20/2018    Years since quitting: 0.9  . Smokeless tobacco: Never Used  Substance Use Topics  . Alcohol use: No    Alcohol/week: 0.0 standard drinks  . Drug use: No     Allergies   Sertraline hcl, Amoxicillin-pot clavulanate, Clindamycin/lincomycin, Paroxetine hcl, and Sulfa antibiotics   Review of Systems Review of Systems  All other systems reviewed and are negative.    Physical Exam Updated Vital Signs BP (!) 151/83   Pulse (!) 114   Temp (!) 97.5 F (36.4 C)   Resp (!) 27   SpO2 96%   Physical Exam Vitals signs and nursing note reviewed. Exam conducted with a chaperone present.  Constitutional:      Appearance: Normal appearance.     Comments: Uncomfortable on exam.  HENT:     Head: Normocephalic and atraumatic.  Eyes:     General: No scleral icterus.    Extraocular Movements: Extraocular movements intact.     Conjunctiva/sclera: Conjunctivae normal.     Pupils: Pupils are equal, round, and reactive to light.  Cardiovascular:     Rate and Rhythm: Normal rate and regular rhythm.     Pulses: Normal pulses.     Heart sounds: Normal heart sounds.  Pulmonary:     Effort: Pulmonary effort is normal. No respiratory distress.     Breath sounds: Normal breath sounds. No wheezing or rales.  Abdominal:     Comments: Soft, nondistended.  Mild TTP diffusely.  No guarding.  No overlying skin changes.  Musculoskeletal:  Normal range of  motion.  Skin:    General: Skin is dry.  Neurological:     Mental Status: She is alert and oriented to person, place, and time.     GCS: GCS eye subscore is 4. GCS verbal subscore is 5. GCS motor subscore is 6.     Cranial Nerves: No cranial nerve deficit.     Sensory: No sensory deficit.     Motor: No weakness.  Psychiatric:        Mood and Affect: Mood normal.        Thought Content: Thought content normal.     Comments: Anxious.      ED Treatments / Results  Labs (all labs ordered are listed, but only abnormal results are displayed) Labs Reviewed  CBC WITH DIFFERENTIAL/PLATELET - Abnormal; Notable for the following components:      Result Value   HCT 46.5 (*)    Lymphs Abs 0.5 (*)    All other components within normal limits  COMPREHENSIVE METABOLIC PANEL - Abnormal; Notable for the following components:   Sodium 132 (*)    Chloride 93 (*)    Glucose, Bld 133 (*)    All other components within normal limits  URINALYSIS, ROUTINE W REFLEX MICROSCOPIC - Abnormal; Notable for the following components:   Color, Urine STRAW (*)    Hgb urine dipstick MODERATE (*)    Ketones, ur 20 (*)    Bacteria, UA RARE (*)    All other components within normal limits  LIPASE, BLOOD    EKG EKG Interpretation  Date/Time:  Tuesday February 19 2019 18:20:29 EST Ventricular Rate:  106 PR Interval:    QRS Duration: 85 QT Interval:  375 QTC Calculation: 498 R Axis:   99 Text Interpretation: Sinus tachycardia Ventricular premature complex Right atrial enlargement Right axis deviation Nonspecific T abnrm, anterolateral leads Borderline prolonged QT interval Confirmed by Milton Ferguson [id number]) on 02/19/2019 10:41:30 PM   Radiology No results found.  Procedures Procedures (including critical care time)  Medications Ordered in ED Medications  HYDROmorphone (DILAUDID) injection 2 mg (2 mg Intravenous Given 02/19/19 2209)     Initial Impression / Assessment and Plan / ED Course  I  have reviewed the triage vital signs and the nursing notes.  Pertinent labs & imaging results that were available during my care of the patient were reviewed by me and considered in my medical decision making (see chart for details).        I personally reviewed patient's past medical record and she had extensive CT imaging performed on 12/11/2018 involving her thoracic and lumbar spine as well as her abdomen and pelvis with contrast.  Imaging of spine demonstrated old appearing compression fractures and severe osteopenia, but no acute abnormalities.  CT obtained of abdomen and pelvis demonstrated sigmoid diverticulosis without obstruction or information.  No other intra-abdominal or pelvic pathology.   Patient reports that her abdominal pain and discomfort is the same and has been for the past 2 months.  Nothing new or different.  No new traumas.  She denies any changes in bowel habits or urinary symptoms.  No focal abdominal tenderness on physical exam.  She has not demonstrated any nausea or vomiting during her stay here in the ED.  EKG demonstrated borderline QT prolongation and I withheld from additional Zofran or Phenergan.  Fortunately she is feeling improved.  She regularly takes 4 mg of Dilaudid p.o. 3 times a day, will provide her with a 2 mg  IM injection of Dilaudid here in the ED.    Patient also endorses a headache, but she states that this is her "regular Tylenol headache".  She reports that she has this headache frequently and denies any blurred vision, other visual deficits, neurologic symptoms, or any other numbness or tingling that is new or unusual for her.  She reports that she occasionally gets numbness and tingling with her fibromyalgia. Discussed case with Dr. Roderic Palau and he agrees with no imaging at this time.   Patient was very reassured by today's work-up.  Her urine demonstrated hematuria, but no evidence of infection.  Low suspicion for dissection or kidney stones given the  chronicity of her abdominal discomfort.  Her lipase was normal and her CMP was unremarkable.  She is mildly hyponatremic, but much improved from her last ED presentation.  She understands that she needs to follow-up with her chronic pain specialists at [facility], Dr. Nelva Bush and Geroge Baseman, regarding her chronic pain.  After receiving some pain medications here in the ED, patient feels markedly improved.  She denies any current nausea symptoms.  She feels prepared for discharge home.  Unfortunately however, she was saturating in the 80s on room air, but she states that is her baseline.  Evidently she has been asked to use supplemental oxygen at home in the past, but she declines.  She is able speak in full sentences and denies any lightheadedness or increased respiratory effort.  Regardless, strong encouraged her to use her at-home supplemental oxygen.   Strict return precautions discussed with patient including but not limited to uncontrolled nausea and vomiting, fevers or chills, new or worsening abdominal discomfort, chest pain or shortness of breath, neurologic deficits, or any other new or worsening symptoms.  All of the evaluation and work-up results were discussed with the patient and any family at bedside. They were provided opportunity to ask any additional questions and have none at this time. They have expressed understanding of verbal discharge instructions as well as return precautions and are agreeable to the plan.    Final Clinical Impressions(s) / ED Diagnoses   Final diagnoses:  Chronic generalized abdominal pain    ED Discharge Orders    None       Corena Herter, PA-C 02/19/19 2244    Milton Ferguson, MD 02/20/19 2258

## 2019-02-19 NOTE — ED Triage Notes (Signed)
Pt here from [facility] via [facility] for abdominal pain, nausea and dry heaving that started today.  EMS gave 4mg  of zofran, staff at [facility] stated they gave her some dilaudid but pt denies this. Hx of chronic pain, COPD, CHF, anxiety. VSS. AOx4.

## 2019-02-19 NOTE — ED Notes (Signed)
Called ptar @9 :42pm- said it was going to be a bit but no exact ETA

## 2019-02-19 NOTE — ED Notes (Signed)
Pt removed nasal cannula, saying she "feels fine and doesn't need it", O2 sat dipped into 80s%, now 92% on RA. Pt state she is supposed to wear O2 due to her COPD at home but refuses to. Provider notified. Will continue to monitor

## 2019-02-20 DIAGNOSIS — M2550 Pain in unspecified joint: Secondary | ICD-10-CM | POA: Diagnosis not present

## 2019-02-20 DIAGNOSIS — Z743 Need for continuous supervision: Secondary | ICD-10-CM | POA: Diagnosis not present

## 2019-02-20 DIAGNOSIS — R0689 Other abnormalities of breathing: Secondary | ICD-10-CM | POA: Diagnosis not present

## 2019-02-20 DIAGNOSIS — R5381 Other malaise: Secondary | ICD-10-CM | POA: Diagnosis not present

## 2019-02-20 DIAGNOSIS — Z7401 Bed confinement status: Secondary | ICD-10-CM | POA: Diagnosis not present

## 2019-02-25 DIAGNOSIS — K5903 Drug induced constipation: Secondary | ICD-10-CM | POA: Diagnosis not present

## 2019-02-25 DIAGNOSIS — S22080D Wedge compression fracture of T11-T12 vertebra, subsequent encounter for fracture with routine healing: Secondary | ICD-10-CM | POA: Diagnosis not present

## 2019-02-25 DIAGNOSIS — S22070D Wedge compression fracture of T9-T10 vertebra, subsequent encounter for fracture with routine healing: Secondary | ICD-10-CM | POA: Diagnosis not present

## 2019-02-25 DIAGNOSIS — G8929 Other chronic pain: Secondary | ICD-10-CM | POA: Diagnosis not present

## 2019-03-01 DIAGNOSIS — Z87891 Personal history of nicotine dependence: Secondary | ICD-10-CM | POA: Diagnosis not present

## 2019-03-01 DIAGNOSIS — I5032 Chronic diastolic (congestive) heart failure: Secondary | ICD-10-CM | POA: Diagnosis not present

## 2019-03-01 DIAGNOSIS — M8008XD Age-related osteoporosis with current pathological fracture, vertebra(e), subsequent encounter for fracture with routine healing: Secondary | ICD-10-CM | POA: Diagnosis not present

## 2019-03-01 DIAGNOSIS — G8929 Other chronic pain: Secondary | ICD-10-CM | POA: Diagnosis not present

## 2019-03-01 DIAGNOSIS — M797 Fibromyalgia: Secondary | ICD-10-CM | POA: Diagnosis not present

## 2019-03-01 DIAGNOSIS — Z79891 Long term (current) use of opiate analgesic: Secondary | ICD-10-CM | POA: Diagnosis not present

## 2019-03-01 DIAGNOSIS — I272 Pulmonary hypertension, unspecified: Secondary | ICD-10-CM | POA: Diagnosis not present

## 2019-03-01 DIAGNOSIS — K5903 Drug induced constipation: Secondary | ICD-10-CM | POA: Diagnosis not present

## 2019-03-01 DIAGNOSIS — J358 Other chronic diseases of tonsils and adenoids: Secondary | ICD-10-CM | POA: Diagnosis not present

## 2019-03-01 DIAGNOSIS — I739 Peripheral vascular disease, unspecified: Secondary | ICD-10-CM | POA: Diagnosis not present

## 2019-03-01 DIAGNOSIS — J449 Chronic obstructive pulmonary disease, unspecified: Secondary | ICD-10-CM | POA: Diagnosis not present

## 2019-03-02 DIAGNOSIS — G8929 Other chronic pain: Secondary | ICD-10-CM | POA: Diagnosis not present

## 2019-03-02 DIAGNOSIS — M8008XD Age-related osteoporosis with current pathological fracture, vertebra(e), subsequent encounter for fracture with routine healing: Secondary | ICD-10-CM | POA: Diagnosis not present

## 2019-03-05 DIAGNOSIS — G8929 Other chronic pain: Secondary | ICD-10-CM | POA: Diagnosis not present

## 2019-03-05 DIAGNOSIS — M797 Fibromyalgia: Secondary | ICD-10-CM | POA: Diagnosis not present

## 2019-03-05 DIAGNOSIS — J358 Other chronic diseases of tonsils and adenoids: Secondary | ICD-10-CM | POA: Diagnosis not present

## 2019-03-05 DIAGNOSIS — Z79891 Long term (current) use of opiate analgesic: Secondary | ICD-10-CM | POA: Diagnosis not present

## 2019-03-05 DIAGNOSIS — S22080S Wedge compression fracture of T11-T12 vertebra, sequela: Secondary | ICD-10-CM | POA: Diagnosis not present

## 2019-03-05 DIAGNOSIS — I5032 Chronic diastolic (congestive) heart failure: Secondary | ICD-10-CM | POA: Diagnosis not present

## 2019-03-05 DIAGNOSIS — I272 Pulmonary hypertension, unspecified: Secondary | ICD-10-CM | POA: Diagnosis not present

## 2019-03-05 DIAGNOSIS — J449 Chronic obstructive pulmonary disease, unspecified: Secondary | ICD-10-CM | POA: Diagnosis not present

## 2019-03-05 DIAGNOSIS — Z87891 Personal history of nicotine dependence: Secondary | ICD-10-CM | POA: Diagnosis not present

## 2019-03-05 DIAGNOSIS — K5903 Drug induced constipation: Secondary | ICD-10-CM | POA: Diagnosis not present

## 2019-03-05 DIAGNOSIS — M8008XD Age-related osteoporosis with current pathological fracture, vertebra(e), subsequent encounter for fracture with routine healing: Secondary | ICD-10-CM | POA: Diagnosis not present

## 2019-03-05 DIAGNOSIS — I739 Peripheral vascular disease, unspecified: Secondary | ICD-10-CM | POA: Diagnosis not present

## 2019-03-11 DIAGNOSIS — E871 Hypo-osmolality and hyponatremia: Secondary | ICD-10-CM | POA: Diagnosis not present

## 2019-03-11 DIAGNOSIS — J358 Other chronic diseases of tonsils and adenoids: Secondary | ICD-10-CM | POA: Diagnosis not present

## 2019-03-13 DIAGNOSIS — G8929 Other chronic pain: Secondary | ICD-10-CM | POA: Diagnosis not present

## 2019-03-13 DIAGNOSIS — I272 Pulmonary hypertension, unspecified: Secondary | ICD-10-CM | POA: Diagnosis not present

## 2019-03-13 DIAGNOSIS — I739 Peripheral vascular disease, unspecified: Secondary | ICD-10-CM | POA: Diagnosis not present

## 2019-03-13 DIAGNOSIS — M8008XD Age-related osteoporosis with current pathological fracture, vertebra(e), subsequent encounter for fracture with routine healing: Secondary | ICD-10-CM | POA: Diagnosis not present

## 2019-03-13 DIAGNOSIS — I5032 Chronic diastolic (congestive) heart failure: Secondary | ICD-10-CM | POA: Diagnosis not present

## 2019-03-13 DIAGNOSIS — Z79891 Long term (current) use of opiate analgesic: Secondary | ICD-10-CM | POA: Diagnosis not present

## 2019-03-13 DIAGNOSIS — J449 Chronic obstructive pulmonary disease, unspecified: Secondary | ICD-10-CM | POA: Diagnosis not present

## 2019-03-13 DIAGNOSIS — Z87891 Personal history of nicotine dependence: Secondary | ICD-10-CM | POA: Diagnosis not present

## 2019-03-13 DIAGNOSIS — K5903 Drug induced constipation: Secondary | ICD-10-CM | POA: Diagnosis not present

## 2019-03-13 DIAGNOSIS — J358 Other chronic diseases of tonsils and adenoids: Secondary | ICD-10-CM | POA: Diagnosis not present

## 2019-03-13 DIAGNOSIS — M797 Fibromyalgia: Secondary | ICD-10-CM | POA: Diagnosis not present

## 2019-04-08 DIAGNOSIS — S22070D Wedge compression fracture of T9-T10 vertebra, subsequent encounter for fracture with routine healing: Secondary | ICD-10-CM | POA: Diagnosis not present

## 2019-04-08 DIAGNOSIS — S22080D Wedge compression fracture of T11-T12 vertebra, subsequent encounter for fracture with routine healing: Secondary | ICD-10-CM | POA: Diagnosis not present

## 2019-04-08 DIAGNOSIS — F5101 Primary insomnia: Secondary | ICD-10-CM | POA: Diagnosis not present

## 2019-04-08 DIAGNOSIS — G8929 Other chronic pain: Secondary | ICD-10-CM | POA: Diagnosis not present

## 2019-04-09 DIAGNOSIS — S32040G Wedge compression fracture of fourth lumbar vertebra, subsequent encounter for fracture with delayed healing: Secondary | ICD-10-CM | POA: Diagnosis not present

## 2019-04-09 DIAGNOSIS — S22000A Wedge compression fracture of unspecified thoracic vertebra, initial encounter for closed fracture: Secondary | ICD-10-CM | POA: Diagnosis not present

## 2019-04-11 DIAGNOSIS — Z03818 Encounter for observation for suspected exposure to other biological agents ruled out: Secondary | ICD-10-CM | POA: Diagnosis not present

## 2019-04-22 DIAGNOSIS — G894 Chronic pain syndrome: Secondary | ICD-10-CM | POA: Diagnosis not present

## 2019-04-22 DIAGNOSIS — M6281 Muscle weakness (generalized): Secondary | ICD-10-CM | POA: Diagnosis not present

## 2019-04-22 DIAGNOSIS — Z03818 Encounter for observation for suspected exposure to other biological agents ruled out: Secondary | ICD-10-CM | POA: Diagnosis not present

## 2019-04-22 DIAGNOSIS — M546 Pain in thoracic spine: Secondary | ICD-10-CM | POA: Diagnosis not present

## 2019-04-25 DIAGNOSIS — Z03818 Encounter for observation for suspected exposure to other biological agents ruled out: Secondary | ICD-10-CM | POA: Diagnosis not present

## 2019-04-26 DIAGNOSIS — M40204 Unspecified kyphosis, thoracic region: Secondary | ICD-10-CM | POA: Diagnosis not present

## 2019-04-26 DIAGNOSIS — Z4789 Encounter for other orthopedic aftercare: Secondary | ICD-10-CM | POA: Diagnosis not present

## 2019-04-26 DIAGNOSIS — M62830 Muscle spasm of back: Secondary | ICD-10-CM | POA: Diagnosis not present

## 2019-04-26 DIAGNOSIS — T402X5D Adverse effect of other opioids, subsequent encounter: Secondary | ICD-10-CM | POA: Diagnosis not present

## 2019-04-26 DIAGNOSIS — Z87891 Personal history of nicotine dependence: Secondary | ICD-10-CM | POA: Diagnosis not present

## 2019-04-26 DIAGNOSIS — M797 Fibromyalgia: Secondary | ICD-10-CM | POA: Diagnosis not present

## 2019-04-26 DIAGNOSIS — M8008XG Age-related osteoporosis with current pathological fracture, vertebra(e), subsequent encounter for fracture with delayed healing: Secondary | ICD-10-CM | POA: Diagnosis not present

## 2019-04-26 DIAGNOSIS — G894 Chronic pain syndrome: Secondary | ICD-10-CM | POA: Diagnosis not present

## 2019-04-26 DIAGNOSIS — C220 Liver cell carcinoma: Secondary | ICD-10-CM | POA: Diagnosis not present

## 2019-04-26 DIAGNOSIS — J449 Chronic obstructive pulmonary disease, unspecified: Secondary | ICD-10-CM | POA: Diagnosis not present

## 2019-04-26 DIAGNOSIS — K5903 Drug induced constipation: Secondary | ICD-10-CM | POA: Diagnosis not present

## 2019-04-26 DIAGNOSIS — Z9181 History of falling: Secondary | ICD-10-CM | POA: Diagnosis not present

## 2019-04-26 DIAGNOSIS — M546 Pain in thoracic spine: Secondary | ICD-10-CM | POA: Diagnosis not present

## 2019-04-26 DIAGNOSIS — I272 Pulmonary hypertension, unspecified: Secondary | ICD-10-CM | POA: Diagnosis not present

## 2019-04-26 DIAGNOSIS — I739 Peripheral vascular disease, unspecified: Secondary | ICD-10-CM | POA: Diagnosis not present

## 2019-04-26 DIAGNOSIS — I5032 Chronic diastolic (congestive) heart failure: Secondary | ICD-10-CM | POA: Diagnosis not present

## 2019-04-26 DIAGNOSIS — M545 Low back pain: Secondary | ICD-10-CM | POA: Diagnosis not present

## 2019-04-26 DIAGNOSIS — M47814 Spondylosis without myelopathy or radiculopathy, thoracic region: Secondary | ICD-10-CM | POA: Diagnosis not present

## 2019-04-29 DIAGNOSIS — G8929 Other chronic pain: Secondary | ICD-10-CM | POA: Diagnosis not present

## 2019-04-29 DIAGNOSIS — S22080D Wedge compression fracture of T11-T12 vertebra, subsequent encounter for fracture with routine healing: Secondary | ICD-10-CM | POA: Diagnosis not present

## 2019-04-29 DIAGNOSIS — F5101 Primary insomnia: Secondary | ICD-10-CM | POA: Diagnosis not present

## 2019-04-29 DIAGNOSIS — S22070D Wedge compression fracture of T9-T10 vertebra, subsequent encounter for fracture with routine healing: Secondary | ICD-10-CM | POA: Diagnosis not present

## 2019-04-30 DIAGNOSIS — M8008XG Age-related osteoporosis with current pathological fracture, vertebra(e), subsequent encounter for fracture with delayed healing: Secondary | ICD-10-CM | POA: Diagnosis not present

## 2019-04-30 DIAGNOSIS — G894 Chronic pain syndrome: Secondary | ICD-10-CM | POA: Diagnosis not present

## 2019-04-30 DIAGNOSIS — Z9181 History of falling: Secondary | ICD-10-CM | POA: Diagnosis not present

## 2019-04-30 DIAGNOSIS — M797 Fibromyalgia: Secondary | ICD-10-CM | POA: Diagnosis not present

## 2019-04-30 DIAGNOSIS — T402X5D Adverse effect of other opioids, subsequent encounter: Secondary | ICD-10-CM | POA: Diagnosis not present

## 2019-04-30 DIAGNOSIS — I272 Pulmonary hypertension, unspecified: Secondary | ICD-10-CM | POA: Diagnosis not present

## 2019-04-30 DIAGNOSIS — C220 Liver cell carcinoma: Secondary | ICD-10-CM | POA: Diagnosis not present

## 2019-04-30 DIAGNOSIS — M47814 Spondylosis without myelopathy or radiculopathy, thoracic region: Secondary | ICD-10-CM | POA: Diagnosis not present

## 2019-04-30 DIAGNOSIS — F5109 Other insomnia not due to a substance or known physiological condition: Secondary | ICD-10-CM | POA: Diagnosis not present

## 2019-04-30 DIAGNOSIS — Z87891 Personal history of nicotine dependence: Secondary | ICD-10-CM | POA: Diagnosis not present

## 2019-04-30 DIAGNOSIS — J449 Chronic obstructive pulmonary disease, unspecified: Secondary | ICD-10-CM | POA: Diagnosis not present

## 2019-04-30 DIAGNOSIS — M40204 Unspecified kyphosis, thoracic region: Secondary | ICD-10-CM | POA: Diagnosis not present

## 2019-04-30 DIAGNOSIS — M545 Low back pain: Secondary | ICD-10-CM | POA: Diagnosis not present

## 2019-04-30 DIAGNOSIS — Z4789 Encounter for other orthopedic aftercare: Secondary | ICD-10-CM | POA: Diagnosis not present

## 2019-04-30 DIAGNOSIS — K5903 Drug induced constipation: Secondary | ICD-10-CM | POA: Diagnosis not present

## 2019-04-30 DIAGNOSIS — I739 Peripheral vascular disease, unspecified: Secondary | ICD-10-CM | POA: Diagnosis not present

## 2019-04-30 DIAGNOSIS — M546 Pain in thoracic spine: Secondary | ICD-10-CM | POA: Diagnosis not present

## 2019-04-30 DIAGNOSIS — M62830 Muscle spasm of back: Secondary | ICD-10-CM | POA: Diagnosis not present

## 2019-04-30 DIAGNOSIS — I5032 Chronic diastolic (congestive) heart failure: Secondary | ICD-10-CM | POA: Diagnosis not present

## 2019-05-02 ENCOUNTER — Telehealth: Payer: Self-pay | Admitting: Hematology

## 2019-05-02 DIAGNOSIS — I5032 Chronic diastolic (congestive) heart failure: Secondary | ICD-10-CM | POA: Diagnosis not present

## 2019-05-02 DIAGNOSIS — M47814 Spondylosis without myelopathy or radiculopathy, thoracic region: Secondary | ICD-10-CM | POA: Diagnosis not present

## 2019-05-02 DIAGNOSIS — Z9181 History of falling: Secondary | ICD-10-CM | POA: Diagnosis not present

## 2019-05-02 DIAGNOSIS — M546 Pain in thoracic spine: Secondary | ICD-10-CM | POA: Diagnosis not present

## 2019-05-02 DIAGNOSIS — M62830 Muscle spasm of back: Secondary | ICD-10-CM | POA: Diagnosis not present

## 2019-05-02 DIAGNOSIS — I272 Pulmonary hypertension, unspecified: Secondary | ICD-10-CM | POA: Diagnosis not present

## 2019-05-02 DIAGNOSIS — M545 Low back pain: Secondary | ICD-10-CM | POA: Diagnosis not present

## 2019-05-02 DIAGNOSIS — I739 Peripheral vascular disease, unspecified: Secondary | ICD-10-CM | POA: Diagnosis not present

## 2019-05-02 DIAGNOSIS — T402X5D Adverse effect of other opioids, subsequent encounter: Secondary | ICD-10-CM | POA: Diagnosis not present

## 2019-05-02 DIAGNOSIS — J449 Chronic obstructive pulmonary disease, unspecified: Secondary | ICD-10-CM | POA: Diagnosis not present

## 2019-05-02 DIAGNOSIS — K5903 Drug induced constipation: Secondary | ICD-10-CM | POA: Diagnosis not present

## 2019-05-02 DIAGNOSIS — M40204 Unspecified kyphosis, thoracic region: Secondary | ICD-10-CM | POA: Diagnosis not present

## 2019-05-02 DIAGNOSIS — Z87891 Personal history of nicotine dependence: Secondary | ICD-10-CM | POA: Diagnosis not present

## 2019-05-02 DIAGNOSIS — G894 Chronic pain syndrome: Secondary | ICD-10-CM | POA: Diagnosis not present

## 2019-05-02 DIAGNOSIS — C220 Liver cell carcinoma: Secondary | ICD-10-CM | POA: Diagnosis not present

## 2019-05-02 DIAGNOSIS — M8008XG Age-related osteoporosis with current pathological fracture, vertebra(e), subsequent encounter for fracture with delayed healing: Secondary | ICD-10-CM | POA: Diagnosis not present

## 2019-05-02 DIAGNOSIS — M797 Fibromyalgia: Secondary | ICD-10-CM | POA: Diagnosis not present

## 2019-05-02 DIAGNOSIS — Z4789 Encounter for other orthopedic aftercare: Secondary | ICD-10-CM | POA: Diagnosis not present

## 2019-05-02 NOTE — Telephone Encounter (Signed)
Returned patient's phone call regarding rescheduling 03/29 lab appointment time, per patient's request appointment time has been rescheduled.

## 2019-05-06 DIAGNOSIS — M797 Fibromyalgia: Secondary | ICD-10-CM | POA: Diagnosis not present

## 2019-05-06 DIAGNOSIS — M40204 Unspecified kyphosis, thoracic region: Secondary | ICD-10-CM | POA: Diagnosis not present

## 2019-05-06 DIAGNOSIS — Z9181 History of falling: Secondary | ICD-10-CM | POA: Diagnosis not present

## 2019-05-06 DIAGNOSIS — M546 Pain in thoracic spine: Secondary | ICD-10-CM | POA: Diagnosis not present

## 2019-05-06 DIAGNOSIS — M47814 Spondylosis without myelopathy or radiculopathy, thoracic region: Secondary | ICD-10-CM | POA: Diagnosis not present

## 2019-05-06 DIAGNOSIS — K5903 Drug induced constipation: Secondary | ICD-10-CM | POA: Diagnosis not present

## 2019-05-06 DIAGNOSIS — I5032 Chronic diastolic (congestive) heart failure: Secondary | ICD-10-CM | POA: Diagnosis not present

## 2019-05-06 DIAGNOSIS — Z4789 Encounter for other orthopedic aftercare: Secondary | ICD-10-CM | POA: Diagnosis not present

## 2019-05-06 DIAGNOSIS — T402X5D Adverse effect of other opioids, subsequent encounter: Secondary | ICD-10-CM | POA: Diagnosis not present

## 2019-05-06 DIAGNOSIS — M545 Low back pain: Secondary | ICD-10-CM | POA: Diagnosis not present

## 2019-05-06 DIAGNOSIS — I272 Pulmonary hypertension, unspecified: Secondary | ICD-10-CM | POA: Diagnosis not present

## 2019-05-06 DIAGNOSIS — Z87891 Personal history of nicotine dependence: Secondary | ICD-10-CM | POA: Diagnosis not present

## 2019-05-06 DIAGNOSIS — C220 Liver cell carcinoma: Secondary | ICD-10-CM | POA: Diagnosis not present

## 2019-05-06 DIAGNOSIS — M62830 Muscle spasm of back: Secondary | ICD-10-CM | POA: Diagnosis not present

## 2019-05-06 DIAGNOSIS — I739 Peripheral vascular disease, unspecified: Secondary | ICD-10-CM | POA: Diagnosis not present

## 2019-05-06 DIAGNOSIS — M8008XG Age-related osteoporosis with current pathological fracture, vertebra(e), subsequent encounter for fracture with delayed healing: Secondary | ICD-10-CM | POA: Diagnosis not present

## 2019-05-06 DIAGNOSIS — J449 Chronic obstructive pulmonary disease, unspecified: Secondary | ICD-10-CM | POA: Diagnosis not present

## 2019-05-06 DIAGNOSIS — G894 Chronic pain syndrome: Secondary | ICD-10-CM | POA: Diagnosis not present

## 2019-05-07 ENCOUNTER — Telehealth: Payer: Self-pay | Admitting: Hematology

## 2019-05-07 NOTE — Telephone Encounter (Signed)
Rescheduled appt per MD being on PAL 3/31.  Spoke with pt daughter and she is aware of the new appt date and time.

## 2019-05-08 DIAGNOSIS — M40204 Unspecified kyphosis, thoracic region: Secondary | ICD-10-CM | POA: Diagnosis not present

## 2019-05-08 DIAGNOSIS — T402X5D Adverse effect of other opioids, subsequent encounter: Secondary | ICD-10-CM | POA: Diagnosis not present

## 2019-05-08 DIAGNOSIS — J449 Chronic obstructive pulmonary disease, unspecified: Secondary | ICD-10-CM | POA: Diagnosis not present

## 2019-05-08 DIAGNOSIS — M546 Pain in thoracic spine: Secondary | ICD-10-CM | POA: Diagnosis not present

## 2019-05-08 DIAGNOSIS — G894 Chronic pain syndrome: Secondary | ICD-10-CM | POA: Diagnosis not present

## 2019-05-08 DIAGNOSIS — I739 Peripheral vascular disease, unspecified: Secondary | ICD-10-CM | POA: Diagnosis not present

## 2019-05-08 DIAGNOSIS — I272 Pulmonary hypertension, unspecified: Secondary | ICD-10-CM | POA: Diagnosis not present

## 2019-05-08 DIAGNOSIS — M8008XG Age-related osteoporosis with current pathological fracture, vertebra(e), subsequent encounter for fracture with delayed healing: Secondary | ICD-10-CM | POA: Diagnosis not present

## 2019-05-08 DIAGNOSIS — C220 Liver cell carcinoma: Secondary | ICD-10-CM | POA: Diagnosis not present

## 2019-05-08 DIAGNOSIS — Z87891 Personal history of nicotine dependence: Secondary | ICD-10-CM | POA: Diagnosis not present

## 2019-05-08 DIAGNOSIS — M62830 Muscle spasm of back: Secondary | ICD-10-CM | POA: Diagnosis not present

## 2019-05-08 DIAGNOSIS — Z4789 Encounter for other orthopedic aftercare: Secondary | ICD-10-CM | POA: Diagnosis not present

## 2019-05-08 DIAGNOSIS — M797 Fibromyalgia: Secondary | ICD-10-CM | POA: Diagnosis not present

## 2019-05-08 DIAGNOSIS — M47814 Spondylosis without myelopathy or radiculopathy, thoracic region: Secondary | ICD-10-CM | POA: Diagnosis not present

## 2019-05-08 DIAGNOSIS — K5903 Drug induced constipation: Secondary | ICD-10-CM | POA: Diagnosis not present

## 2019-05-08 DIAGNOSIS — M545 Low back pain: Secondary | ICD-10-CM | POA: Diagnosis not present

## 2019-05-08 DIAGNOSIS — I5032 Chronic diastolic (congestive) heart failure: Secondary | ICD-10-CM | POA: Diagnosis not present

## 2019-05-08 DIAGNOSIS — Z9181 History of falling: Secondary | ICD-10-CM | POA: Diagnosis not present

## 2019-05-15 DIAGNOSIS — M8008XG Age-related osteoporosis with current pathological fracture, vertebra(e), subsequent encounter for fracture with delayed healing: Secondary | ICD-10-CM | POA: Diagnosis not present

## 2019-05-15 DIAGNOSIS — J449 Chronic obstructive pulmonary disease, unspecified: Secondary | ICD-10-CM | POA: Diagnosis not present

## 2019-05-15 DIAGNOSIS — M545 Low back pain: Secondary | ICD-10-CM | POA: Diagnosis not present

## 2019-05-15 DIAGNOSIS — Z9181 History of falling: Secondary | ICD-10-CM | POA: Diagnosis not present

## 2019-05-15 DIAGNOSIS — K5903 Drug induced constipation: Secondary | ICD-10-CM | POA: Diagnosis not present

## 2019-05-15 DIAGNOSIS — I739 Peripheral vascular disease, unspecified: Secondary | ICD-10-CM | POA: Diagnosis not present

## 2019-05-15 DIAGNOSIS — G894 Chronic pain syndrome: Secondary | ICD-10-CM | POA: Diagnosis not present

## 2019-05-15 DIAGNOSIS — Z87891 Personal history of nicotine dependence: Secondary | ICD-10-CM | POA: Diagnosis not present

## 2019-05-15 DIAGNOSIS — I272 Pulmonary hypertension, unspecified: Secondary | ICD-10-CM | POA: Diagnosis not present

## 2019-05-15 DIAGNOSIS — I5032 Chronic diastolic (congestive) heart failure: Secondary | ICD-10-CM | POA: Diagnosis not present

## 2019-05-15 DIAGNOSIS — M546 Pain in thoracic spine: Secondary | ICD-10-CM | POA: Diagnosis not present

## 2019-05-15 DIAGNOSIS — C220 Liver cell carcinoma: Secondary | ICD-10-CM | POA: Diagnosis not present

## 2019-05-15 DIAGNOSIS — M40204 Unspecified kyphosis, thoracic region: Secondary | ICD-10-CM | POA: Diagnosis not present

## 2019-05-15 DIAGNOSIS — Z4789 Encounter for other orthopedic aftercare: Secondary | ICD-10-CM | POA: Diagnosis not present

## 2019-05-15 DIAGNOSIS — M797 Fibromyalgia: Secondary | ICD-10-CM | POA: Diagnosis not present

## 2019-05-15 DIAGNOSIS — T402X5D Adverse effect of other opioids, subsequent encounter: Secondary | ICD-10-CM | POA: Diagnosis not present

## 2019-05-15 DIAGNOSIS — M47814 Spondylosis without myelopathy or radiculopathy, thoracic region: Secondary | ICD-10-CM | POA: Diagnosis not present

## 2019-05-15 DIAGNOSIS — M62830 Muscle spasm of back: Secondary | ICD-10-CM | POA: Diagnosis not present

## 2019-05-17 DIAGNOSIS — M62830 Muscle spasm of back: Secondary | ICD-10-CM | POA: Diagnosis not present

## 2019-05-17 DIAGNOSIS — C220 Liver cell carcinoma: Secondary | ICD-10-CM | POA: Diagnosis not present

## 2019-05-17 DIAGNOSIS — T402X5D Adverse effect of other opioids, subsequent encounter: Secondary | ICD-10-CM | POA: Diagnosis not present

## 2019-05-17 DIAGNOSIS — M797 Fibromyalgia: Secondary | ICD-10-CM | POA: Diagnosis not present

## 2019-05-17 DIAGNOSIS — J449 Chronic obstructive pulmonary disease, unspecified: Secondary | ICD-10-CM | POA: Diagnosis not present

## 2019-05-17 DIAGNOSIS — Z4789 Encounter for other orthopedic aftercare: Secondary | ICD-10-CM | POA: Diagnosis not present

## 2019-05-17 DIAGNOSIS — Z87891 Personal history of nicotine dependence: Secondary | ICD-10-CM | POA: Diagnosis not present

## 2019-05-17 DIAGNOSIS — M40204 Unspecified kyphosis, thoracic region: Secondary | ICD-10-CM | POA: Diagnosis not present

## 2019-05-17 DIAGNOSIS — M47814 Spondylosis without myelopathy or radiculopathy, thoracic region: Secondary | ICD-10-CM | POA: Diagnosis not present

## 2019-05-17 DIAGNOSIS — M546 Pain in thoracic spine: Secondary | ICD-10-CM | POA: Diagnosis not present

## 2019-05-17 DIAGNOSIS — M8008XG Age-related osteoporosis with current pathological fracture, vertebra(e), subsequent encounter for fracture with delayed healing: Secondary | ICD-10-CM | POA: Diagnosis not present

## 2019-05-17 DIAGNOSIS — G894 Chronic pain syndrome: Secondary | ICD-10-CM | POA: Diagnosis not present

## 2019-05-17 DIAGNOSIS — Z9181 History of falling: Secondary | ICD-10-CM | POA: Diagnosis not present

## 2019-05-17 DIAGNOSIS — I272 Pulmonary hypertension, unspecified: Secondary | ICD-10-CM | POA: Diagnosis not present

## 2019-05-17 DIAGNOSIS — I739 Peripheral vascular disease, unspecified: Secondary | ICD-10-CM | POA: Diagnosis not present

## 2019-05-17 DIAGNOSIS — I5032 Chronic diastolic (congestive) heart failure: Secondary | ICD-10-CM | POA: Diagnosis not present

## 2019-05-17 DIAGNOSIS — M545 Low back pain: Secondary | ICD-10-CM | POA: Diagnosis not present

## 2019-05-17 DIAGNOSIS — K5903 Drug induced constipation: Secondary | ICD-10-CM | POA: Diagnosis not present

## 2019-05-20 DIAGNOSIS — S22070D Wedge compression fracture of T9-T10 vertebra, subsequent encounter for fracture with routine healing: Secondary | ICD-10-CM | POA: Diagnosis not present

## 2019-05-20 DIAGNOSIS — R5383 Other fatigue: Secondary | ICD-10-CM | POA: Diagnosis not present

## 2019-05-20 DIAGNOSIS — G8929 Other chronic pain: Secondary | ICD-10-CM | POA: Diagnosis not present

## 2019-05-20 DIAGNOSIS — S22080D Wedge compression fracture of T11-T12 vertebra, subsequent encounter for fracture with routine healing: Secondary | ICD-10-CM | POA: Diagnosis not present

## 2019-05-21 DIAGNOSIS — I509 Heart failure, unspecified: Secondary | ICD-10-CM | POA: Diagnosis not present

## 2019-05-21 DIAGNOSIS — Z79899 Other long term (current) drug therapy: Secondary | ICD-10-CM | POA: Diagnosis not present

## 2019-05-21 DIAGNOSIS — C220 Liver cell carcinoma: Secondary | ICD-10-CM | POA: Diagnosis not present

## 2019-05-21 DIAGNOSIS — M810 Age-related osteoporosis without current pathological fracture: Secondary | ICD-10-CM | POA: Diagnosis not present

## 2019-05-22 DIAGNOSIS — Z9181 History of falling: Secondary | ICD-10-CM | POA: Diagnosis not present

## 2019-05-22 DIAGNOSIS — I739 Peripheral vascular disease, unspecified: Secondary | ICD-10-CM | POA: Diagnosis not present

## 2019-05-22 DIAGNOSIS — M47814 Spondylosis without myelopathy or radiculopathy, thoracic region: Secondary | ICD-10-CM | POA: Diagnosis not present

## 2019-05-22 DIAGNOSIS — G894 Chronic pain syndrome: Secondary | ICD-10-CM | POA: Diagnosis not present

## 2019-05-22 DIAGNOSIS — Z87891 Personal history of nicotine dependence: Secondary | ICD-10-CM | POA: Diagnosis not present

## 2019-05-22 DIAGNOSIS — J449 Chronic obstructive pulmonary disease, unspecified: Secondary | ICD-10-CM | POA: Diagnosis not present

## 2019-05-22 DIAGNOSIS — T402X5D Adverse effect of other opioids, subsequent encounter: Secondary | ICD-10-CM | POA: Diagnosis not present

## 2019-05-22 DIAGNOSIS — I5032 Chronic diastolic (congestive) heart failure: Secondary | ICD-10-CM | POA: Diagnosis not present

## 2019-05-22 DIAGNOSIS — M62830 Muscle spasm of back: Secondary | ICD-10-CM | POA: Diagnosis not present

## 2019-05-22 DIAGNOSIS — M545 Low back pain: Secondary | ICD-10-CM | POA: Diagnosis not present

## 2019-05-22 DIAGNOSIS — K5903 Drug induced constipation: Secondary | ICD-10-CM | POA: Diagnosis not present

## 2019-05-22 DIAGNOSIS — M8008XG Age-related osteoporosis with current pathological fracture, vertebra(e), subsequent encounter for fracture with delayed healing: Secondary | ICD-10-CM | POA: Diagnosis not present

## 2019-05-22 DIAGNOSIS — I272 Pulmonary hypertension, unspecified: Secondary | ICD-10-CM | POA: Diagnosis not present

## 2019-05-22 DIAGNOSIS — M546 Pain in thoracic spine: Secondary | ICD-10-CM | POA: Diagnosis not present

## 2019-05-22 DIAGNOSIS — C220 Liver cell carcinoma: Secondary | ICD-10-CM | POA: Diagnosis not present

## 2019-05-22 DIAGNOSIS — M40204 Unspecified kyphosis, thoracic region: Secondary | ICD-10-CM | POA: Diagnosis not present

## 2019-05-22 DIAGNOSIS — Z4789 Encounter for other orthopedic aftercare: Secondary | ICD-10-CM | POA: Diagnosis not present

## 2019-05-22 DIAGNOSIS — M797 Fibromyalgia: Secondary | ICD-10-CM | POA: Diagnosis not present

## 2019-05-24 DIAGNOSIS — M546 Pain in thoracic spine: Secondary | ICD-10-CM | POA: Diagnosis not present

## 2019-05-24 DIAGNOSIS — Z9181 History of falling: Secondary | ICD-10-CM | POA: Diagnosis not present

## 2019-05-24 DIAGNOSIS — M40204 Unspecified kyphosis, thoracic region: Secondary | ICD-10-CM | POA: Diagnosis not present

## 2019-05-24 DIAGNOSIS — T402X5D Adverse effect of other opioids, subsequent encounter: Secondary | ICD-10-CM | POA: Diagnosis not present

## 2019-05-24 DIAGNOSIS — I272 Pulmonary hypertension, unspecified: Secondary | ICD-10-CM | POA: Diagnosis not present

## 2019-05-24 DIAGNOSIS — M545 Low back pain: Secondary | ICD-10-CM | POA: Diagnosis not present

## 2019-05-24 DIAGNOSIS — C220 Liver cell carcinoma: Secondary | ICD-10-CM | POA: Diagnosis not present

## 2019-05-24 DIAGNOSIS — I739 Peripheral vascular disease, unspecified: Secondary | ICD-10-CM | POA: Diagnosis not present

## 2019-05-24 DIAGNOSIS — J449 Chronic obstructive pulmonary disease, unspecified: Secondary | ICD-10-CM | POA: Diagnosis not present

## 2019-05-24 DIAGNOSIS — M47814 Spondylosis without myelopathy or radiculopathy, thoracic region: Secondary | ICD-10-CM | POA: Diagnosis not present

## 2019-05-24 DIAGNOSIS — M8008XG Age-related osteoporosis with current pathological fracture, vertebra(e), subsequent encounter for fracture with delayed healing: Secondary | ICD-10-CM | POA: Diagnosis not present

## 2019-05-24 DIAGNOSIS — K5903 Drug induced constipation: Secondary | ICD-10-CM | POA: Diagnosis not present

## 2019-05-24 DIAGNOSIS — M797 Fibromyalgia: Secondary | ICD-10-CM | POA: Diagnosis not present

## 2019-05-24 DIAGNOSIS — G894 Chronic pain syndrome: Secondary | ICD-10-CM | POA: Diagnosis not present

## 2019-05-24 DIAGNOSIS — M62830 Muscle spasm of back: Secondary | ICD-10-CM | POA: Diagnosis not present

## 2019-05-24 DIAGNOSIS — I5032 Chronic diastolic (congestive) heart failure: Secondary | ICD-10-CM | POA: Diagnosis not present

## 2019-05-24 DIAGNOSIS — Z87891 Personal history of nicotine dependence: Secondary | ICD-10-CM | POA: Diagnosis not present

## 2019-05-24 DIAGNOSIS — Z4789 Encounter for other orthopedic aftercare: Secondary | ICD-10-CM | POA: Diagnosis not present

## 2019-05-27 DIAGNOSIS — S22070D Wedge compression fracture of T9-T10 vertebra, subsequent encounter for fracture with routine healing: Secondary | ICD-10-CM | POA: Diagnosis not present

## 2019-05-27 DIAGNOSIS — G8929 Other chronic pain: Secondary | ICD-10-CM | POA: Diagnosis not present

## 2019-05-27 DIAGNOSIS — S22080D Wedge compression fracture of T11-T12 vertebra, subsequent encounter for fracture with routine healing: Secondary | ICD-10-CM | POA: Diagnosis not present

## 2019-05-27 DIAGNOSIS — R5383 Other fatigue: Secondary | ICD-10-CM | POA: Diagnosis not present

## 2019-05-29 DIAGNOSIS — G894 Chronic pain syndrome: Secondary | ICD-10-CM | POA: Diagnosis not present

## 2019-05-29 DIAGNOSIS — M8008XG Age-related osteoporosis with current pathological fracture, vertebra(e), subsequent encounter for fracture with delayed healing: Secondary | ICD-10-CM | POA: Diagnosis not present

## 2019-05-29 DIAGNOSIS — M62830 Muscle spasm of back: Secondary | ICD-10-CM | POA: Diagnosis not present

## 2019-05-29 DIAGNOSIS — M797 Fibromyalgia: Secondary | ICD-10-CM | POA: Diagnosis not present

## 2019-05-29 DIAGNOSIS — I272 Pulmonary hypertension, unspecified: Secondary | ICD-10-CM | POA: Diagnosis not present

## 2019-05-29 DIAGNOSIS — I739 Peripheral vascular disease, unspecified: Secondary | ICD-10-CM | POA: Diagnosis not present

## 2019-05-29 DIAGNOSIS — M545 Low back pain: Secondary | ICD-10-CM | POA: Diagnosis not present

## 2019-05-29 DIAGNOSIS — M40204 Unspecified kyphosis, thoracic region: Secondary | ICD-10-CM | POA: Diagnosis not present

## 2019-05-29 DIAGNOSIS — T402X5D Adverse effect of other opioids, subsequent encounter: Secondary | ICD-10-CM | POA: Diagnosis not present

## 2019-05-29 DIAGNOSIS — C220 Liver cell carcinoma: Secondary | ICD-10-CM | POA: Diagnosis not present

## 2019-05-29 DIAGNOSIS — Z9181 History of falling: Secondary | ICD-10-CM | POA: Diagnosis not present

## 2019-05-29 DIAGNOSIS — M546 Pain in thoracic spine: Secondary | ICD-10-CM | POA: Diagnosis not present

## 2019-05-29 DIAGNOSIS — K5903 Drug induced constipation: Secondary | ICD-10-CM | POA: Diagnosis not present

## 2019-05-29 DIAGNOSIS — M47814 Spondylosis without myelopathy or radiculopathy, thoracic region: Secondary | ICD-10-CM | POA: Diagnosis not present

## 2019-05-29 DIAGNOSIS — Z87891 Personal history of nicotine dependence: Secondary | ICD-10-CM | POA: Diagnosis not present

## 2019-05-29 DIAGNOSIS — J449 Chronic obstructive pulmonary disease, unspecified: Secondary | ICD-10-CM | POA: Diagnosis not present

## 2019-05-29 DIAGNOSIS — Z4789 Encounter for other orthopedic aftercare: Secondary | ICD-10-CM | POA: Diagnosis not present

## 2019-05-29 DIAGNOSIS — I5032 Chronic diastolic (congestive) heart failure: Secondary | ICD-10-CM | POA: Diagnosis not present

## 2019-05-31 DIAGNOSIS — M47814 Spondylosis without myelopathy or radiculopathy, thoracic region: Secondary | ICD-10-CM | POA: Diagnosis not present

## 2019-05-31 DIAGNOSIS — M8008XG Age-related osteoporosis with current pathological fracture, vertebra(e), subsequent encounter for fracture with delayed healing: Secondary | ICD-10-CM | POA: Diagnosis not present

## 2019-05-31 DIAGNOSIS — Z4789 Encounter for other orthopedic aftercare: Secondary | ICD-10-CM | POA: Diagnosis not present

## 2019-05-31 DIAGNOSIS — M797 Fibromyalgia: Secondary | ICD-10-CM | POA: Diagnosis not present

## 2019-05-31 DIAGNOSIS — I272 Pulmonary hypertension, unspecified: Secondary | ICD-10-CM | POA: Diagnosis not present

## 2019-05-31 DIAGNOSIS — Z9181 History of falling: Secondary | ICD-10-CM | POA: Diagnosis not present

## 2019-05-31 DIAGNOSIS — C220 Liver cell carcinoma: Secondary | ICD-10-CM | POA: Diagnosis not present

## 2019-05-31 DIAGNOSIS — F5109 Other insomnia not due to a substance or known physiological condition: Secondary | ICD-10-CM | POA: Diagnosis not present

## 2019-05-31 DIAGNOSIS — M545 Low back pain: Secondary | ICD-10-CM | POA: Diagnosis not present

## 2019-05-31 DIAGNOSIS — I5032 Chronic diastolic (congestive) heart failure: Secondary | ICD-10-CM | POA: Diagnosis not present

## 2019-05-31 DIAGNOSIS — T402X5D Adverse effect of other opioids, subsequent encounter: Secondary | ICD-10-CM | POA: Diagnosis not present

## 2019-05-31 DIAGNOSIS — J449 Chronic obstructive pulmonary disease, unspecified: Secondary | ICD-10-CM | POA: Diagnosis not present

## 2019-05-31 DIAGNOSIS — I739 Peripheral vascular disease, unspecified: Secondary | ICD-10-CM | POA: Diagnosis not present

## 2019-05-31 DIAGNOSIS — K5903 Drug induced constipation: Secondary | ICD-10-CM | POA: Diagnosis not present

## 2019-05-31 DIAGNOSIS — M40204 Unspecified kyphosis, thoracic region: Secondary | ICD-10-CM | POA: Diagnosis not present

## 2019-05-31 DIAGNOSIS — M546 Pain in thoracic spine: Secondary | ICD-10-CM | POA: Diagnosis not present

## 2019-05-31 DIAGNOSIS — M62830 Muscle spasm of back: Secondary | ICD-10-CM | POA: Diagnosis not present

## 2019-05-31 DIAGNOSIS — G894 Chronic pain syndrome: Secondary | ICD-10-CM | POA: Diagnosis not present

## 2019-05-31 DIAGNOSIS — Z87891 Personal history of nicotine dependence: Secondary | ICD-10-CM | POA: Diagnosis not present

## 2019-06-04 DIAGNOSIS — T402X5D Adverse effect of other opioids, subsequent encounter: Secondary | ICD-10-CM | POA: Diagnosis not present

## 2019-06-04 DIAGNOSIS — M40204 Unspecified kyphosis, thoracic region: Secondary | ICD-10-CM | POA: Diagnosis not present

## 2019-06-04 DIAGNOSIS — J449 Chronic obstructive pulmonary disease, unspecified: Secondary | ICD-10-CM | POA: Diagnosis not present

## 2019-06-04 DIAGNOSIS — Z4789 Encounter for other orthopedic aftercare: Secondary | ICD-10-CM | POA: Diagnosis not present

## 2019-06-04 DIAGNOSIS — M546 Pain in thoracic spine: Secondary | ICD-10-CM | POA: Diagnosis not present

## 2019-06-04 DIAGNOSIS — M62830 Muscle spasm of back: Secondary | ICD-10-CM | POA: Diagnosis not present

## 2019-06-04 DIAGNOSIS — C220 Liver cell carcinoma: Secondary | ICD-10-CM | POA: Diagnosis not present

## 2019-06-04 DIAGNOSIS — K5903 Drug induced constipation: Secondary | ICD-10-CM | POA: Diagnosis not present

## 2019-06-04 DIAGNOSIS — G894 Chronic pain syndrome: Secondary | ICD-10-CM | POA: Diagnosis not present

## 2019-06-04 DIAGNOSIS — Z87891 Personal history of nicotine dependence: Secondary | ICD-10-CM | POA: Diagnosis not present

## 2019-06-04 DIAGNOSIS — M47814 Spondylosis without myelopathy or radiculopathy, thoracic region: Secondary | ICD-10-CM | POA: Diagnosis not present

## 2019-06-04 DIAGNOSIS — M545 Low back pain: Secondary | ICD-10-CM | POA: Diagnosis not present

## 2019-06-04 DIAGNOSIS — Z9181 History of falling: Secondary | ICD-10-CM | POA: Diagnosis not present

## 2019-06-04 DIAGNOSIS — M797 Fibromyalgia: Secondary | ICD-10-CM | POA: Diagnosis not present

## 2019-06-04 DIAGNOSIS — I272 Pulmonary hypertension, unspecified: Secondary | ICD-10-CM | POA: Diagnosis not present

## 2019-06-04 DIAGNOSIS — M8008XG Age-related osteoporosis with current pathological fracture, vertebra(e), subsequent encounter for fracture with delayed healing: Secondary | ICD-10-CM | POA: Diagnosis not present

## 2019-06-04 DIAGNOSIS — I5032 Chronic diastolic (congestive) heart failure: Secondary | ICD-10-CM | POA: Diagnosis not present

## 2019-06-04 DIAGNOSIS — I739 Peripheral vascular disease, unspecified: Secondary | ICD-10-CM | POA: Diagnosis not present

## 2019-06-06 DIAGNOSIS — I5032 Chronic diastolic (congestive) heart failure: Secondary | ICD-10-CM | POA: Diagnosis not present

## 2019-06-06 DIAGNOSIS — Z4789 Encounter for other orthopedic aftercare: Secondary | ICD-10-CM | POA: Diagnosis not present

## 2019-06-06 DIAGNOSIS — M545 Low back pain: Secondary | ICD-10-CM | POA: Diagnosis not present

## 2019-06-06 DIAGNOSIS — M62830 Muscle spasm of back: Secondary | ICD-10-CM | POA: Diagnosis not present

## 2019-06-06 DIAGNOSIS — T402X5D Adverse effect of other opioids, subsequent encounter: Secondary | ICD-10-CM | POA: Diagnosis not present

## 2019-06-06 DIAGNOSIS — I739 Peripheral vascular disease, unspecified: Secondary | ICD-10-CM | POA: Diagnosis not present

## 2019-06-06 DIAGNOSIS — M797 Fibromyalgia: Secondary | ICD-10-CM | POA: Diagnosis not present

## 2019-06-06 DIAGNOSIS — M47814 Spondylosis without myelopathy or radiculopathy, thoracic region: Secondary | ICD-10-CM | POA: Diagnosis not present

## 2019-06-06 DIAGNOSIS — Z87891 Personal history of nicotine dependence: Secondary | ICD-10-CM | POA: Diagnosis not present

## 2019-06-06 DIAGNOSIS — K5903 Drug induced constipation: Secondary | ICD-10-CM | POA: Diagnosis not present

## 2019-06-06 DIAGNOSIS — M40204 Unspecified kyphosis, thoracic region: Secondary | ICD-10-CM | POA: Diagnosis not present

## 2019-06-06 DIAGNOSIS — C220 Liver cell carcinoma: Secondary | ICD-10-CM | POA: Diagnosis not present

## 2019-06-06 DIAGNOSIS — M546 Pain in thoracic spine: Secondary | ICD-10-CM | POA: Diagnosis not present

## 2019-06-06 DIAGNOSIS — Z9181 History of falling: Secondary | ICD-10-CM | POA: Diagnosis not present

## 2019-06-06 DIAGNOSIS — G894 Chronic pain syndrome: Secondary | ICD-10-CM | POA: Diagnosis not present

## 2019-06-06 DIAGNOSIS — I272 Pulmonary hypertension, unspecified: Secondary | ICD-10-CM | POA: Diagnosis not present

## 2019-06-06 DIAGNOSIS — J449 Chronic obstructive pulmonary disease, unspecified: Secondary | ICD-10-CM | POA: Diagnosis not present

## 2019-06-06 DIAGNOSIS — M8008XG Age-related osteoporosis with current pathological fracture, vertebra(e), subsequent encounter for fracture with delayed healing: Secondary | ICD-10-CM | POA: Diagnosis not present

## 2019-06-10 DIAGNOSIS — Z4789 Encounter for other orthopedic aftercare: Secondary | ICD-10-CM | POA: Diagnosis not present

## 2019-06-10 DIAGNOSIS — I5032 Chronic diastolic (congestive) heart failure: Secondary | ICD-10-CM | POA: Diagnosis not present

## 2019-06-10 DIAGNOSIS — I739 Peripheral vascular disease, unspecified: Secondary | ICD-10-CM | POA: Diagnosis not present

## 2019-06-10 DIAGNOSIS — Z9181 History of falling: Secondary | ICD-10-CM | POA: Diagnosis not present

## 2019-06-10 DIAGNOSIS — M40204 Unspecified kyphosis, thoracic region: Secondary | ICD-10-CM | POA: Diagnosis not present

## 2019-06-10 DIAGNOSIS — T402X5D Adverse effect of other opioids, subsequent encounter: Secondary | ICD-10-CM | POA: Diagnosis not present

## 2019-06-10 DIAGNOSIS — G894 Chronic pain syndrome: Secondary | ICD-10-CM | POA: Diagnosis not present

## 2019-06-10 DIAGNOSIS — K5903 Drug induced constipation: Secondary | ICD-10-CM | POA: Diagnosis not present

## 2019-06-10 DIAGNOSIS — Z87891 Personal history of nicotine dependence: Secondary | ICD-10-CM | POA: Diagnosis not present

## 2019-06-10 DIAGNOSIS — M8008XG Age-related osteoporosis with current pathological fracture, vertebra(e), subsequent encounter for fracture with delayed healing: Secondary | ICD-10-CM | POA: Diagnosis not present

## 2019-06-10 DIAGNOSIS — M62830 Muscle spasm of back: Secondary | ICD-10-CM | POA: Diagnosis not present

## 2019-06-10 DIAGNOSIS — J449 Chronic obstructive pulmonary disease, unspecified: Secondary | ICD-10-CM | POA: Diagnosis not present

## 2019-06-10 DIAGNOSIS — C220 Liver cell carcinoma: Secondary | ICD-10-CM | POA: Diagnosis not present

## 2019-06-10 DIAGNOSIS — I272 Pulmonary hypertension, unspecified: Secondary | ICD-10-CM | POA: Diagnosis not present

## 2019-06-10 DIAGNOSIS — M47814 Spondylosis without myelopathy or radiculopathy, thoracic region: Secondary | ICD-10-CM | POA: Diagnosis not present

## 2019-06-10 DIAGNOSIS — M545 Low back pain: Secondary | ICD-10-CM | POA: Diagnosis not present

## 2019-06-10 DIAGNOSIS — M797 Fibromyalgia: Secondary | ICD-10-CM | POA: Diagnosis not present

## 2019-06-10 DIAGNOSIS — M546 Pain in thoracic spine: Secondary | ICD-10-CM | POA: Diagnosis not present

## 2019-06-11 DIAGNOSIS — Z79899 Other long term (current) drug therapy: Secondary | ICD-10-CM | POA: Diagnosis not present

## 2019-06-11 DIAGNOSIS — N390 Urinary tract infection, site not specified: Secondary | ICD-10-CM | POA: Diagnosis not present

## 2019-06-12 DIAGNOSIS — T402X5D Adverse effect of other opioids, subsequent encounter: Secondary | ICD-10-CM | POA: Diagnosis not present

## 2019-06-12 DIAGNOSIS — M8008XG Age-related osteoporosis with current pathological fracture, vertebra(e), subsequent encounter for fracture with delayed healing: Secondary | ICD-10-CM | POA: Diagnosis not present

## 2019-06-12 DIAGNOSIS — M62830 Muscle spasm of back: Secondary | ICD-10-CM | POA: Diagnosis not present

## 2019-06-12 DIAGNOSIS — Z9181 History of falling: Secondary | ICD-10-CM | POA: Diagnosis not present

## 2019-06-12 DIAGNOSIS — C220 Liver cell carcinoma: Secondary | ICD-10-CM | POA: Diagnosis not present

## 2019-06-12 DIAGNOSIS — I739 Peripheral vascular disease, unspecified: Secondary | ICD-10-CM | POA: Diagnosis not present

## 2019-06-12 DIAGNOSIS — J449 Chronic obstructive pulmonary disease, unspecified: Secondary | ICD-10-CM | POA: Diagnosis not present

## 2019-06-12 DIAGNOSIS — I272 Pulmonary hypertension, unspecified: Secondary | ICD-10-CM | POA: Diagnosis not present

## 2019-06-12 DIAGNOSIS — M545 Low back pain: Secondary | ICD-10-CM | POA: Diagnosis not present

## 2019-06-12 DIAGNOSIS — M40204 Unspecified kyphosis, thoracic region: Secondary | ICD-10-CM | POA: Diagnosis not present

## 2019-06-12 DIAGNOSIS — G894 Chronic pain syndrome: Secondary | ICD-10-CM | POA: Diagnosis not present

## 2019-06-12 DIAGNOSIS — Z87891 Personal history of nicotine dependence: Secondary | ICD-10-CM | POA: Diagnosis not present

## 2019-06-12 DIAGNOSIS — M546 Pain in thoracic spine: Secondary | ICD-10-CM | POA: Diagnosis not present

## 2019-06-12 DIAGNOSIS — Z4789 Encounter for other orthopedic aftercare: Secondary | ICD-10-CM | POA: Diagnosis not present

## 2019-06-12 DIAGNOSIS — I5032 Chronic diastolic (congestive) heart failure: Secondary | ICD-10-CM | POA: Diagnosis not present

## 2019-06-12 DIAGNOSIS — M47814 Spondylosis without myelopathy or radiculopathy, thoracic region: Secondary | ICD-10-CM | POA: Diagnosis not present

## 2019-06-12 DIAGNOSIS — M797 Fibromyalgia: Secondary | ICD-10-CM | POA: Diagnosis not present

## 2019-06-12 DIAGNOSIS — K5903 Drug induced constipation: Secondary | ICD-10-CM | POA: Diagnosis not present

## 2019-06-17 ENCOUNTER — Other Ambulatory Visit: Payer: Medicare Other

## 2019-06-17 ENCOUNTER — Ambulatory Visit (HOSPITAL_COMMUNITY)
Admission: RE | Admit: 2019-06-17 | Discharge: 2019-06-17 | Disposition: A | Discharge status: Home / Self Care | Payer: Medicare Other | Source: Ambulatory Visit | Attending: Nurse Practitioner | Admitting: Nurse Practitioner

## 2019-06-17 ENCOUNTER — Inpatient Hospital Stay: Payer: Medicare Other

## 2019-06-17 ENCOUNTER — Other Ambulatory Visit: Payer: Self-pay

## 2019-06-17 DIAGNOSIS — C220 Liver cell carcinoma: Secondary | ICD-10-CM | POA: Diagnosis not present

## 2019-06-17 DIAGNOSIS — K769 Liver disease, unspecified: Secondary | ICD-10-CM | POA: Diagnosis not present

## 2019-06-17 LAB — POCT I-STAT CREATININE: Creatinine, Ser: 0.6 mg/dL (ref 0.44–1.00)

## 2019-06-17 MED ORDER — GADOBUTROL 1 MMOL/ML IV SOLN
5.0000 mL | Freq: Once | INTRAVENOUS | Status: AC | PRN
  Administered 2019-06-17: 5 mL via INTRAVENOUS

## 2019-06-19 ENCOUNTER — Ambulatory Visit: Payer: Medicare Other | Admitting: Hematology

## 2019-06-20 ENCOUNTER — Other Ambulatory Visit: Payer: Self-pay

## 2019-06-20 ENCOUNTER — Encounter: Payer: Self-pay | Admitting: Nurse Practitioner

## 2019-06-20 ENCOUNTER — Inpatient Hospital Stay: Payer: Medicare Other | Attending: Hematology | Admitting: Nurse Practitioner

## 2019-06-20 VITALS — BP 168/77 | HR 80 | Temp 98.5°F | Resp 18 | Ht 63.0 in | Wt 106.7 lb

## 2019-06-20 DIAGNOSIS — Z8505 Personal history of malignant neoplasm of liver: Secondary | ICD-10-CM | POA: Diagnosis not present

## 2019-06-20 DIAGNOSIS — Z79899 Other long term (current) drug therapy: Secondary | ICD-10-CM | POA: Diagnosis not present

## 2019-06-20 DIAGNOSIS — I509 Heart failure, unspecified: Secondary | ICD-10-CM | POA: Diagnosis not present

## 2019-06-20 DIAGNOSIS — Z87891 Personal history of nicotine dependence: Secondary | ICD-10-CM | POA: Insufficient documentation

## 2019-06-20 DIAGNOSIS — J449 Chronic obstructive pulmonary disease, unspecified: Secondary | ICD-10-CM | POA: Diagnosis not present

## 2019-06-20 DIAGNOSIS — C220 Liver cell carcinoma: Secondary | ICD-10-CM

## 2019-06-20 DIAGNOSIS — Z9049 Acquired absence of other specified parts of digestive tract: Secondary | ICD-10-CM | POA: Insufficient documentation

## 2019-06-20 NOTE — Progress Notes (Signed)
[place]   [phone] [phone]   Clinic Follow up Note   Patient Care Team: Roetta Sessions, NP as PCP - General (Nurse Practitioner) Buford Dresser, MD as PCP - Cardiology (Cardiology) Elam Dutch, MD as Consulting Physician (Vascular Surgery) Stark Klein, MD as Consulting Physician (General Surgery) Truitt Merle, MD as Consulting Physician (Hematology) Suella Broad, MD as Consulting Physician (Physical Medicine and Rehabilitation) Valinda Party, MD as Consulting Physician (Rheumatology) Tanda Rockers, MD as Consulting Physician (Pulmonary Disease) Abbie Sons, MD as Referring Physician (Psychiatry) 06/20/2019  CHIEF COMPLAINT: F/u [place]  SUMMARY OF ONCOLOGIC HISTORY: Oncology History Overview Note  Hepatocellular carcinoma ([place])   Staging form: Liver (Excluding Intrahepatic Bile Ducts), AJCC 7th Edition     Clinical stage from 12/30/2014: Stage Unknown (T1, NX, M0) - Unsigned     Hepatocellular carcinoma ([place])  12/02/2014 Imaging   Liver MRI showed a large lesion in segment 5 and 40, highly suspicious for malignancy. And a 1.2 cm indeterminate pancreatic body lesion. Mildly enlarged portocaval lymph node. Cholelithiasis.   12/19/2014 Initial Diagnosis   Hepatocellular carcinoma ([place])   12/19/2014 Initial Biopsy   Liver mass biopsy showed parathyroid carcinoma, positive for hep par 1, cytokeratin 8/18, cytokeratin 7 and MOC-31.   12/25/2014 Imaging   CT chest showed a 3 mm groundglass probably nodule in the right upper lobe, mild right pericardial phrenic lymphadenopathy, no definitive evidence of metastasis.   01/13/2015 Procedure   Liquid embolization of the right portal venous system by Interventional Radiologist Dr. Laurence Ferrari   03/04/2015 Surgery   Liver right lobe hepatectomy, and the portal lymph nodes biopsy   03/04/2015 Pathology Results   Hepatocellular carcinoma, poorly differentiated,  spanning 8.5 cm, surgical margins were negative, lymphovascular invasion is identified, for portal lymph nodes were negative.    02/29/2016 Imaging   CT chest w contrast 02/29/2016 IMPRESSION: 1. Status post partial right hepatectomy, without recurrent or metastatic disease. 2. Removal of common duct stent since 06/10/2015. Upper normal to mildly dilated common duct after cholecystectomy. Recommend attention on follow-up. 3. Similar pancreatic head lesion, as detailed on prior MRI. 4. Pelvic floor laxity with cystocele. 5.  Aortic atherosclerosis.   10/14/2016 Imaging   MRI Abdomen W WO Contrast 10/14/16 IMPRESSION: 1. Stable appearance of the liver status post right hepatectomy. No evidence of recurrent tumor or metastatic disease. 2. Suggested slight interval enlargement of known cystic pancreatic mass, likely communicating with the main pancreatic duct and probably reflecting an intraductal papillary mucinous neoplasm. This demonstrates no aggressive characteristics, and continued surveillance suggested.    10/20/2017 Imaging   10/20/2017 MRI Abdomen IMPRESSION: 1. Stable appearance of the liver status post right hepatectomy. No evidence of recurrent tumor or metastatic disease. 2. Unchanged appearance of cystic pancreatic lesion which appears to communicate with the main pancreatic duct without main duct dilatation. Findings are compatible with intraductal papillary mucinous neoplasm. Continued interval follow-up is advised. The next follow-up examination should be obtained in 12 months. This recommendation follows ACR consensus guidelines: Management of Incidental Pancreatic Cysts: A White Paper of the ACR Incidental Findings Committee. J Am Coll Radiol [phone].   12/11/2018 Imaging   CT AP w contrast Hepatobiliary: Stable appearance of postsurgical changes of partial liver resection. cholecystectomy. There is dilatation of the common bile duct similar to prior  CT.  IMPRESSION: 1. No acute intra-abdominal or pelvic pathology. 2. Sigmoid diverticulosis. No bowel obstruction or active inflammation. Normal appendix.       06/17/2019  Imaging   IMPRESSION: 1. Mild growth of the suspected intraductal papillary mucinous neoplasm of the pancreatic body, now measuring 1.6 by 1.6 by 1.7 cm, previously 1.5 by 1.3 by 1.5 cm by my measurements. However, this does not qualify as formal "interval growth " based on established criteria. Possibilities for follow up at this point include annual surveillance pancreatic protocol CT or MRI, or endoscopic ultrasound/fine-needle aspiration. 2. Bilateral sacral insufficiency fractures. 3. Prior right hepatectomy. No findings of recurrent hepatocellular carcinoma. 4. Sigmoid colon diverticulosis. 5. Multilevel compression fractures of the lower thoracic and lumbar spine with vertebral augmentation at some levels. 6.  Aortic Atherosclerosis (ICD10-I70.0).     CURRENT THERAPY: Surveillance  INTERVAL HISTORY: Ms. Licata returns for f/u as scheduled. She is doing better now, but this was a hard year of COVID and restrictions. She lost weight but is gaining back. Her energy has improved. Her chronic pains are at baseline, no new pain. Denies n/v/c/d or change in bowel habits. She has not specific complaints today.    MEDICAL HISTORY:  Past Medical History:  Diagnosis Date  . Arthritis    DDD. Right shoulder"is frozen"-limited ROM. osteoporosis.  . Blue toe syndrome ([place]) 11/27/2014   Dr. Claudia Pollock evaluating  . Cancer ([place]) 12/2014   liver cancer  . CHF (congestive heart failure) ([place])   . Complication of anesthesia   . COPD (chronic obstructive pulmonary disease) ([place])   . Fibromyalgia   . Fracture of rib of right side    hx "osteoporosis"- states her dog nudge her on the side, next day developed great pain and was told has a fracture rib.  . Macular degeneration    R eye  . Osteoporosis   . PONV  (postoperative nausea and vomiting)    nausea,severe vomiting after 01-13-15 portal vein embolization  . Productive cough   . Retina disorder    L eye, vision distorted, edema  . Spine fracture due to birth trauma   . TMJ disease   . Wears glasses     SURGICAL HISTORY: Past Surgical History:  Procedure Laterality Date  . ANGIOPLASTY  2008   no stents required, no follow-up with cardiologist, no recurrent chest pain  . CATARACT EXTRACTION, BILATERAL Bilateral   . ESOPHAGOGASTRODUODENOSCOPY (EGD) WITH PROPOFOL N/A 06/25/2015   Procedure: ESOPHAGOGASTRODUODENOSCOPY (EGD) WITH PROPOFOL;  Surgeon: Milus Banister, MD;  Location: [facility];  Service: Endoscopy;  Laterality: N/A;  stent removal   . EYE SURGERY     cornea surgery  . INTRAMEDULLARY (IM) NAIL INTERTROCHANTERIC Right 12/04/2017   Procedure: INTRAMEDULLARY (IM) NAIL INTERTROCHANTRIC;  Surgeon: Rod Can, MD;  Location: [facility];  Service: Orthopedics;  Laterality: Right;  . IR RADIOLOGIST EVAL & MGMT  03/29/2017  . KIDNEY STONE SURGERY    . LAPAROSCOPIC PARTIAL HEPATECTOMY N/A 03/04/2015   Procedure: DIAGNOSTIC LAPAROSCOPY, EXTENDED RIGHT HEPATECTOMY, WITH INTRAOPERATIVE ULTRASOUND;  Surgeon: Stark Klein, MD;  Location: [facility];  Service: General;  Laterality: N/A;  . PARTIAL HYSTERECTOMY    . portal vein embolization     01-13-15 -Dr. Cathlean Sauer.    I have reviewed the social history and family history with the patient and they are unchanged from previous note.  ALLERGIES:  is allergic to sertraline hcl; amoxicillin-pot clavulanate; clindamycin/lincomycin; paroxetine hcl; and sulfa antibiotics.  MEDICATIONS:  Current Outpatient Medications  Medication Sig Dispense Refill  . acetaminophen (TYLENOL) 325 MG tablet Take 2 tablets (650 mg total) by mouth every 6 (six) hours as needed for mild  pain (or Fever >/= 101).    Marland Kitchen ALPRAZolam (XANAX) 0.25 MG tablet Take 1 tablet (0.25 mg total) by mouth 3 (three) times daily  as needed for anxiety. 90 tablet 1  . bisacodyl (DULCOLAX) 10 MG suppository Place 1 suppository (10 mg total) rectally daily as needed for moderate constipation. 12 suppository 0  . busPIRone (BUSPAR) 10 MG tablet     . Calcium Carbonate-Vitamin D (CALCIUM 500+D HIGH POTENCY PO) Take 1 tablet by mouth daily as needed.     . Cholecalciferol (VITAMIN D3) 5000 units CAPS Take 5,000 Units by mouth daily as needed.     . DULoxetine (CYMBALTA) 30 MG capsule Take 1 tablet by mouth daily.    . famotidine (PEPCID) 20 MG tablet Take 20 mg by mouth every evening.    . feeding supplement, ENSURE ENLIVE, (ENSURE ENLIVE) LIQD Take 237 mLs by mouth 2 (two) times daily between meals. (Patient taking differently: Take 1 Bottle by mouth daily. ) 237 mL 12  . fluticasone (FLONASE) 50 MCG/ACT nasal spray Place 2 sprays into both nostrils daily. 16 g 0  . fluticasone (FLONASE) 50 MCG/ACT nasal spray Flonase    . Glycopyrrolate-Formoterol (BEVESPI AEROSPHERE) 9-4.8 MCG/ACT AERO Inhale 2 puffs into the lungs 2 (two) times daily. 1 Inhaler 11  . guaiFENesin (MUCINEX) 600 MG 12 hr tablet Take 2 tablets (1,200 mg total) by mouth 2 (two) times daily as needed. 30 tablet 0  . HYDROcodone-acetaminophen (NORCO/VICODIN) 5-325 MG tablet Take 1 tablet by mouth 3 (three) times daily as needed. 90 tablet 0  . ipratropium-albuterol (DUONEB) 0.5-2.5 (3) MG/3ML SOLN Take 3 mLs by nebulization every 4 (four) hours as needed (SOB).     . metaxalone (SKELAXIN) 800 MG tablet Take 800 mg by mouth as needed for muscle spasms.    . Multiple Vitamins-Minerals (CENTRUM SILVER ULTRA WOMENS PO) Take 1 tablet by mouth daily.     . mupirocin ointment (BACTROBAN) 2 % Apply 1 application topically 2 (two) times daily. 30 g 1  . polyethylene glycol (MIRALAX / GLYCOLAX) packet Take 17 g by mouth daily as needed. 26 each 6  . promethazine (PHENERGAN) 12.5 MG tablet Take 12.5 mg by mouth every 6 (six) hours as needed for nausea or vomiting.    .  senna-docusate (SENOKOT-S) 8.6-50 MG tablet Take 1 tablet by mouth 2 (two) times daily as needed for mild constipation. 60 tablet 12  . thiamine 100 MG tablet Take 1 tablet (100 mg total) by mouth daily. (Patient taking differently: Take 100 mg by mouth daily as needed. )    . vitamin B-12 (CYANOCOBALAMIN) 1000 MCG tablet Take 1,000 mcg by mouth daily as needed.      No current facility-administered medications for this visit.    PHYSICAL EXAMINATION:  Vitals:   06/20/19 1523 06/20/19 1524  BP: (!) 161/74 (!) 168/77  Pulse: 80   Resp: 18   Temp: 98.5 F (36.9 C)   SpO2: (!) 80% 92%   Filed Weights   06/20/19 1523  Weight: 106 lb 11.2 oz (48.4 kg)    GENERAL:alert, no distress and comfortable SKIN: no rash  EYES:  sclera clear LUNGS: decreased, with normal breathing effort HEART: regular rate & rhythm, no lower extremity edema ABDOMEN: abdomen soft, non-tender and normal bowel sounds  NEURO: alert & oriented x 3 with fluent speech Limited exam for covid19 pandemic and patient had no concerns   LABORATORY DATA:  I have reviewed the data as listed CBC Latest Ref  Rng & Units 02/19/2019 12/18/2018 12/10/2018  WBC 4.0 - 10.5 K/uL 8.3 7.0 7.6  Hemoglobin 12.0 - 15.0 g/dL 14.7 14.1 14.7  Hematocrit 36.0 - 46.0 % 46.5(H) 43.1 45.5  Platelets 150 - 400 K/uL 286 248 244     CMP Latest Ref Rng & Units 06/17/2019 02/19/2019 12/18/2018  Glucose 70 - 99 mg/dL - 133(H) 101(H)  BUN 8 - 23 mg/dL - 10 12  Creatinine 0.44 - 1.00 mg/dL 0.60 0.51 0.71  Sodium 135 - 145 mmol/L - 132(L) 124(L)  Potassium 3.5 - 5.1 mmol/L - 4.7 4.7  Chloride 98 - 111 mmol/L - 93(L) 85(L)  CO2 22 - 32 mmol/L - 26 31  Calcium 8.9 - 10.3 mg/dL - 9.0 9.6  Total Protein 6.5 - 8.1 g/dL - 7.0 7.3  Total Bilirubin 0.3 - 1.2 mg/dL - 0.9 0.5  Alkaline Phos 38 - 126 U/L - 121 85  AST 15 - 41 U/L - 38 15  ALT 0 - 44 U/L - 36 10      RADIOGRAPHIC STUDIES: I have personally reviewed the radiological images as listed  and agreed with the findings in the report. No results found.   ASSESSMENT & PLAN: MARGARETTA CHEFF a 74 y.o.femalewith history of  1. Hepatocellular carcinoma, pT3bN0M0, stage IIIB, poorly differentiated -Diagnosed in 11/2014. Treated with embolization and right hepatectomy. Currently on surveillance. -serial imaging showed no evidence of recurrence -plan to monitor at least 5 years  2. Pancreatic cystic lesion -Relatively stable over many years   3. COPD and smoking cessation -f/u with PCPand Dr. Melvyn Novas -Quit smoking completely early 2020  4. Chronic compression fractureand multiple fractures and body pain  -She has had multiple fractures in her spine, sternum, and the right hip -f/u ortho, Dr. Idamae Schuller  5. Osteoporosis -Stopped FORTEO in 2018 because she didn't want injections -Currently on multivitamins and Vitamin d. Continue. -severe osteopenia noted on thoracic and lumbar CTs on 12/11/18.  -Continue f/u with ortho and pain specialist    Disposition:  Ms. Brignoni appears stable. She remains on surveillance. Labs from 02/2019 are unremarkable. Physical exam today is benign, no clinical concern for recurrence. I personally reviewed her MRI and discussed with her today, no evidence of recurrent or metastatic disease. She is nearly 5 years from her initial diagnosis, the recurrence risk is minimal.   She has a cystic lesion in the pancreas that has remained relatively stable for many years, but slightly larger now. We will review her case in GI conference to determine the f/u plan for this lesion.   Lab, f/u in 6 months, or sooner if needed or additional studies are recommended.   All questions were answered. The patient knows to call the clinic with any problems, questions or concerns. No barriers to learning was detected.     Alla Feeling, NP 06/20/19

## 2019-06-21 ENCOUNTER — Telehealth: Payer: Self-pay | Admitting: Nurse Practitioner

## 2019-06-21 NOTE — Telephone Encounter (Signed)
Scheduled appts per 4/1 los. Pt's daughter confirmed appt date and time.

## 2019-06-28 DIAGNOSIS — F5109 Other insomnia not due to a substance or known physiological condition: Secondary | ICD-10-CM | POA: Diagnosis not present

## 2019-07-01 DIAGNOSIS — G8929 Other chronic pain: Secondary | ICD-10-CM | POA: Diagnosis not present

## 2019-07-01 DIAGNOSIS — S22080D Wedge compression fracture of T11-T12 vertebra, subsequent encounter for fracture with routine healing: Secondary | ICD-10-CM | POA: Diagnosis not present

## 2019-07-01 DIAGNOSIS — S22070D Wedge compression fracture of T9-T10 vertebra, subsequent encounter for fracture with routine healing: Secondary | ICD-10-CM | POA: Diagnosis not present

## 2019-07-10 DIAGNOSIS — Z79899 Other long term (current) drug therapy: Secondary | ICD-10-CM | POA: Diagnosis not present

## 2019-07-10 DIAGNOSIS — J449 Chronic obstructive pulmonary disease, unspecified: Secondary | ICD-10-CM | POA: Diagnosis not present

## 2019-07-10 DIAGNOSIS — C220 Liver cell carcinoma: Secondary | ICD-10-CM | POA: Diagnosis not present

## 2019-07-12 ENCOUNTER — Telehealth: Payer: Medicare Other | Admitting: Physician Assistant

## 2019-07-17 ENCOUNTER — Telehealth: Payer: Self-pay | Admitting: Gastroenterology

## 2019-07-17 ENCOUNTER — Other Ambulatory Visit: Payer: Self-pay

## 2019-07-17 NOTE — Telephone Encounter (Signed)
Patty, we discussed her recent imaging findings.  She has a pancreatic cyst with some high risk features and needs endoscopic ultrasound.  Can you contact her to schedule my first available OV to discuss pancreatic cyst.  Thanks    Krista Blue, if you can let her know, we will reach out about office visit first to discuss panreatic cyst, EUS scheduling.

## 2019-07-17 NOTE — Telephone Encounter (Signed)
Appt made to see Dr Ardis Hughs in the office on 08/20/19 at 1030 am. The patient has been notified of this information and all questions answered.

## 2019-07-17 NOTE — Telephone Encounter (Signed)
Thanks much [employer]. I spoke with her daughter and she voiced good understanding and will remind her mother about this appointment.   Truitt Merle MD

## 2019-07-19 ENCOUNTER — Telehealth (INDEPENDENT_AMBULATORY_CARE_PROVIDER_SITE_OTHER): Payer: Medicare Other | Admitting: Cardiology

## 2019-07-19 ENCOUNTER — Encounter: Payer: Self-pay | Admitting: Cardiology

## 2019-07-19 VITALS — BP 159/81 | HR 85 | Ht 63.0 in | Wt 107.0 lb

## 2019-07-19 DIAGNOSIS — I712 Thoracic aortic aneurysm, without rupture, unspecified: Secondary | ICD-10-CM

## 2019-07-19 DIAGNOSIS — I5032 Chronic diastolic (congestive) heart failure: Secondary | ICD-10-CM

## 2019-07-19 DIAGNOSIS — R030 Elevated blood-pressure reading, without diagnosis of hypertension: Secondary | ICD-10-CM

## 2019-07-19 DIAGNOSIS — I272 Pulmonary hypertension, unspecified: Secondary | ICD-10-CM | POA: Diagnosis not present

## 2019-07-19 DIAGNOSIS — Z87898 Personal history of other specified conditions: Secondary | ICD-10-CM

## 2019-07-19 NOTE — Patient Instructions (Addendum)
Medication Instructions:  Continue current medications  *If you need a refill on your cardiac medications before your next appointment, please call your pharmacy*   Lab Work: None Ordered   Testing/Procedures: None Ordered   Follow-Up: At [facility], you and your health needs are our priority.  As part of our continuing mission to provide you with exceptional heart care, we have created designated Provider Care Teams.  These Care Teams include your primary Cardiologist (physician) and Advanced Practice Providers (APPs -  Physician Assistants and Nurse Practitioners) who all work together to provide you with the care you need, when you need it.  We recommend signing up for the patient portal called "MyChart".  Sign up information is provided on this After Visit Summary.  MyChart is used to connect with patients for Virtual Visits (Telemedicine).  Patients are able to view lab/test results, encounter notes, upcoming appointments, etc.  Non-urgent messages can be sent to your provider as well.   To learn more about what you can do with MyChart, go to [email or web address].    Your next appointment:   6 month(s)  The format for your next appointment:   In Person  Provider:   You may see Buford Dresser, MD or one of the following Advanced Practice Providers on your designated Care Team:    Rosaria Ferries, PA-C  Jory Sims, DNP, ANP  Cadence Kathlen Mody, NP    Other:   Fax number [phone]

## 2019-07-19 NOTE — Progress Notes (Signed)
Virtual Visit via Telephone Note   This visit type was conducted due to national recommendations for restrictions regarding the COVID-19 Pandemic (e.g. social distancing) in an effort to limit this patient's exposure and mitigate transmission in our community.  Due to her co-morbid illnesses, this patient is at least at moderate risk for complications without adequate follow up.  This format is felt to be most appropriate for this patient at this time.  The patient did not have access to video technology/had technical difficulties with video requiring transitioning to audio format only (telephone).  All issues noted in this document were discussed and addressed.  No physical exam could be performed with this format.  Please refer to the patient's chart for her  consent to telehealth for [facility].   The patient was identified using 2 identifiers.  Date:  07/19/2019   ID:  Tina Bailey, DOB 12-16-1945, MRN [id number]  Patient Location: Home Provider Location: Office  PCP:  Roetta Sessions, NP  Cardiologist:  Buford Dresser, MD  Electrophysiologist:  None   Evaluation Performed:  Follow-Up Visit  Chief Complaint:  Follow up  History of Present Illness:    Tina Bailey is a 74 y.o. female with  with a hx of hepatocellular carcinoma s/p right hepatectomy 2016, chronic joint pain due to arthritis, history of tobacco abuse (~50 years), COPD who is seen in follow up today. She was initially seen as a new patient to me (last seen by Dr. Oval Linsey in 2016 for preop clearance) at the request of Midge Minium, MD for the evaluation and management of chronic diastolic heart failure on 04/13/18.  The patient does not have symptoms concerning for COVID-19 infection (fever, chills, cough, or new shortness of breath).   Doing well overall. Just starting to have visitors in her assisted living facility, switched rooms which was nice. Has not had any issues with  swelling. Has gained a few lbs with Covid/being less active she thinks. Pain is chronic, but well managed. Had a vertebroplasty in January that helped with pain, hoping to be able to scale back on pain medicatins.  Some days breathing is good, some days it is a bit tougher.   BP elevated today, has already taken morning meds. She is in significant pain now, notes that her BP usually is lower.  Feels like her memory is not as good as it has been. Feels tired frequently. Had labs at her facility last week, hasn't gotten results yet.   Denies chest pain, shortness of breath at rest or with normal exertion. No PND, orthopnea, LE edema or unexpected weight gain. No syncope or palpitations.   Past Medical History:  Diagnosis Date  . Arthritis    DDD. Right shoulder"is frozen"-limited ROM. osteoporosis.  . Blue toe syndrome ([place]) 11/27/2014   Dr. Claudia Pollock evaluating  . Cancer ([place]) 12/2014   liver cancer  . CHF (congestive heart failure) ([place])   . Complication of anesthesia   . COPD (chronic obstructive pulmonary disease) ([place])   . Fibromyalgia   . Fracture of rib of right side    hx "osteoporosis"- states her dog nudge her on the side, next day developed great pain and was told has a fracture rib.  . Macular degeneration    R eye  . Osteoporosis   . PONV (postoperative nausea and vomiting)    nausea,severe vomiting after 01-13-15 portal vein embolization  . Productive cough   . Retina disorder    L eye,  vision distorted, edema  . Spine fracture due to birth trauma   . TMJ disease   . Wears glasses    Past Surgical History:  Procedure Laterality Date  . ANGIOPLASTY  2008   no stents required, no follow-up with cardiologist, no recurrent chest pain  . CATARACT EXTRACTION, BILATERAL Bilateral   . ESOPHAGOGASTRODUODENOSCOPY (EGD) WITH PROPOFOL N/A 06/25/2015   Procedure: ESOPHAGOGASTRODUODENOSCOPY (EGD) WITH PROPOFOL;  Surgeon: Milus Banister, MD;  Location: [facility];  Service:  Endoscopy;  Laterality: N/A;  stent removal   . EYE SURGERY     cornea surgery  . INTRAMEDULLARY (IM) NAIL INTERTROCHANTERIC Right 12/04/2017   Procedure: INTRAMEDULLARY (IM) NAIL INTERTROCHANTRIC;  Surgeon: Rod Can, MD;  Location: [facility];  Service: Orthopedics;  Laterality: Right;  . IR RADIOLOGIST EVAL & MGMT  03/29/2017  . KIDNEY STONE SURGERY    . LAPAROSCOPIC PARTIAL HEPATECTOMY N/A 03/04/2015   Procedure: DIAGNOSTIC LAPAROSCOPY, EXTENDED RIGHT HEPATECTOMY, WITH INTRAOPERATIVE ULTRASOUND;  Surgeon: Stark Klein, MD;  Location: [facility];  Service: General;  Laterality: N/A;  . PARTIAL HYSTERECTOMY    . portal vein embolization     01-13-15 -Dr. Cathlean Sauer.     Current Meds  Medication Sig  . acetaminophen (TYLENOL) 325 MG tablet Take 2 tablets (650 mg total) by mouth every 6 (six) hours as needed for mild pain (or Fever >/= 101).  . bisacodyl (DULCOLAX) 10 MG suppository Place 1 suppository (10 mg total) rectally daily as needed for moderate constipation.  . busPIRone (BUSPAR) 15 MG tablet Take 15 mg by mouth 3 (three) times daily.  . DULoxetine (CYMBALTA) 30 MG capsule Take 1 tablet by mouth daily.  . fluticasone (FLONASE) 50 MCG/ACT nasal spray Place 2 sprays into both nostrils daily.  . Glycopyrrolate-Formoterol (BEVESPI AEROSPHERE) 9-4.8 MCG/ACT AERO Inhale 2 puffs into the lungs 2 (two) times daily.  Marland Kitchen HYDROmorphone (DILAUDID) 2 MG tablet hydromorphone 2 mg tablet  Take One pill PO Q 7AM, 3PM,10 PM     Allergies:   Sertraline hcl, Amoxicillin-pot clavulanate, Clindamycin/lincomycin, Paroxetine hcl, and Sulfa antibiotics   Social History   Tobacco Use  . Smoking status: Former Smoker    Packs/day: 0.50    Years: 45.00    Pack years: 22.50    Types: Cigarettes    Quit date: 02/20/2018    Years since quitting: 1.4  . Smokeless tobacco: Never Used  Substance Use Topics  . Alcohol use: No    Alcohol/week: 0.0 standard drinks  . Drug use: No     Family  Hx: The patient's family history includes Atrial fibrillation in her mother; Dementia in her father; Heart attack in her brother; Heart disease in her brother and mother; Hypertension in her mother; Stroke in her father.  ROS:   Please see the history of present illness.    All other systems reviewed and are negative.   Prior CV studies:   The following studies were reviewed today: Echo 03/02/18 - Left ventricle: The cavity size was normal. Systolic function was  normal. The estimated ejection fraction was in the range of 55%  to 60%. Wall motion was normal; there were no regional wall  motion abnormalities. Doppler parameters are consistent with  abnormal left ventricular relaxation (grade 1 diastolic  dysfunction).  - Right ventricle: The cavity size was mildly dilated. Wall  thickness was normal. Systolic function was mildly reduced.  - Right atrium: There was a mass in the atrial cavity adjacent to  the intra-atrial septum.  -  Tricuspid valve: There was moderate regurgitation.  - Pulmonary arteries: Systolic pressure was severely increased. PA  peak pressure: 60 mm Hg (S).  - Pericardium, extracardiac: A small pericardial effusion was  identified. There is mitral inflow respiratory variability, but  no clear RA/RV collapse.   Impressions:   - Normal LV EF, grade 1 diastolic dysfunction.  There is a structure in the right atrial cavity adjacent to the  intra-atrial septum that cannot be well defined. Could be very  hypertrophied septum, though favor other etiology, cannot exclude  thrombus, myxoma, or other.  Severe pulmonary hypertension, with mild RV dysfunction and  dilation.  Small pericardial effusion, predominantly posterior. There is  mitral inflow variability, though this may be unrelated to  effusion. No RA/RV collapse seen.   CTPE 03/04/18 IMPRESSION: No evidence of pulmonary embolism.  BILATERAL pleural effusions small  RIGHT and tiny LEFT.  Minimal subsegmental atelectasis RIGHT upper lobe.  Osseous demineralization with multiple thoracic spine compression fractures and secondary thoracic kyphosis.  Prior sternal fractures.  Aneurysmal dilatation ascending thoracic aorta 4.2 cm diameter, recommendation below.  Labs/Other Tests and Data Reviewed:    EKG:  EKG is personally reviewed.  The ekg ordered 04/13/18 demonstrates normal sinus rhythm, right axis deviation.  Recent Labs: 02/19/2019: ALT 36; BUN 10; Hemoglobin 14.7; Platelets 286; Potassium 4.7; Sodium 132 06/17/2019: Creatinine, Ser 0.60   Recent Lipid Panel Lab Results  Component Value Date/Time   CHOL 188 01/09/2017 11:09 AM   TRIG 71.0 01/09/2017 11:09 AM   HDL 91.40 01/09/2017 11:09 AM   CHOLHDL 2 01/09/2017 11:09 AM   LDLCALC 82 01/09/2017 11:09 AM    Wt Readings from Last 3 Encounters:  07/19/19 107 lb (48.5 kg)  06/20/19 106 lb 11.2 oz (48.4 kg)  01/14/19 115 lb (52.2 kg)     Objective:    Vital Signs:  BP (!) 159/81   Pulse 85   Ht 5\' 3"  (1.6 m)   Wt 107 lb (48.5 kg)   BMI 18.95 kg/m    Speaking comfortably on the phone, no audible wheezing In no acute distress Alert and oriented Normal affect Normal speech  ASSESSMENT & PLAN:    Chronic diastolic heart failure, severe pulmonary hypertension on echo, history of severe bilateral LE edema:  -doing much better, has not had any recent severe swelling -had nausea on spironolactone. -monitor BP, as below -will call if symptoms recur -continue elevation, compression as tolerated  -continue tobacco abstinence -has previously endorsed that she does not want invasive testing (such as right heart cath) or surgery  Elevated blood pressure reading: high today, though she is in pain. Typically well controlled. Not on antihypertensive at baseline. Followed by her nursing facility. If consistently >140/90, would consider starting low dose medication. I don't have recent  labs, but if Cr/K reasonable, chlorthalidone might be a good option for her.  Thoracic aortic aneurysm: as previously noted, she would not want surgery, does not wish to follow for monitoring  COVID-19 Education: The signs and symptoms of COVID-19 were discussed with the patient and how to seek care for testing (follow up with PCP or arrange E-visit).  The importance of social distancing was discussed today.  Time:   Today, I have spent 11 minutes with the patient with telehealth technology discussing the above problems.    Patient Instructions  Medication Instructions:  Continue current medications  *If you need a refill on your cardiac medications before your next appointment, please call your pharmacy*  Lab Work: None Ordered   Testing/Procedures: None Ordered   Follow-Up: At [facility], you and your health needs are our priority.  As part of our continuing mission to provide you with exceptional heart care, we have created designated Provider Care Teams.  These Care Teams include your primary Cardiologist (physician) and Advanced Practice Providers (APPs -  Physician Assistants and Nurse Practitioners) who all work together to provide you with the care you need, when you need it.  We recommend signing up for the patient portal called "MyChart".  Sign up information is provided on this After Visit Summary.  MyChart is used to connect with patients for Virtual Visits (Telemedicine).  Patients are able to view lab/test results, encounter notes, upcoming appointments, etc.  Non-urgent messages can be sent to your provider as well.   To learn more about what you can do with MyChart, go to [email or web address].    Your next appointment:   6 month(s)  The format for your next appointment:   In Person  Provider:   You may see Buford Dresser, MD or one of the following Advanced Practice Providers on your designated Care Team:    Rosaria Ferries, PA-C  Jory Sims, DNP, ANP  Tarri Glenn, NP    Other:   Fax number [phone]   Signed, Buford Dresser, MD  07/19/2019 8:42 AM    [place]

## 2019-07-24 DIAGNOSIS — I739 Peripheral vascular disease, unspecified: Secondary | ICD-10-CM | POA: Diagnosis not present

## 2019-07-24 DIAGNOSIS — R209 Unspecified disturbances of skin sensation: Secondary | ICD-10-CM | POA: Diagnosis not present

## 2019-07-24 DIAGNOSIS — M2011 Hallux valgus (acquired), right foot: Secondary | ICD-10-CM | POA: Diagnosis not present

## 2019-07-24 DIAGNOSIS — B351 Tinea unguium: Secondary | ICD-10-CM | POA: Diagnosis not present

## 2019-07-24 DIAGNOSIS — L603 Nail dystrophy: Secondary | ICD-10-CM | POA: Diagnosis not present

## 2019-07-26 DIAGNOSIS — F5109 Other insomnia not due to a substance or known physiological condition: Secondary | ICD-10-CM | POA: Diagnosis not present

## 2019-08-16 DIAGNOSIS — M546 Pain in thoracic spine: Secondary | ICD-10-CM | POA: Diagnosis not present

## 2019-08-16 DIAGNOSIS — Z79891 Long term (current) use of opiate analgesic: Secondary | ICD-10-CM | POA: Diagnosis not present

## 2019-08-20 ENCOUNTER — Ambulatory Visit: Payer: Medicare Other | Admitting: Gastroenterology

## 2019-08-23 ENCOUNTER — Ambulatory Visit: Payer: Medicare Other | Admitting: Gastroenterology

## 2019-08-23 ENCOUNTER — Encounter: Payer: Self-pay | Admitting: Gastroenterology

## 2019-08-23 VITALS — BP 154/92 | HR 84 | Ht 58.5 in | Wt 112.2 lb

## 2019-08-23 DIAGNOSIS — K862 Cyst of pancreas: Secondary | ICD-10-CM

## 2019-08-23 DIAGNOSIS — F5109 Other insomnia not due to a substance or known physiological condition: Secondary | ICD-10-CM | POA: Diagnosis not present

## 2019-08-23 NOTE — Progress Notes (Signed)
HPI: This is a very pleasant 74 year old woman who was referred to me by Dr. Truitt Merle to discuss incidental pancreatic cyst.    I actually saw Ms. Hofacker about 4 years ago when I pulled a biliary stent which had failed to migrate following her right hepatectomy.  I have not seen her since.  She is here today for a different problem.  She was diagnosed with hepatocellular cancer in 2016 and on her original imaging she was found to have an incidental 1.2 cm pancreatic cyst.  The cyst has been imaged along with hepatocellular surveillance imaging about annually since then and has slightly grown.  See the comparison of 2016 MRI with 2021 MRI below.  Prior to this she has never had issues with her pancreas.  Pancreas problems do not run in her family.  Her weight is overall stable.  She was never a big alcohol drinker.  Old Data Reviewed:  MRI September 2016: Pancreas: 1.2 cm lesion of the pancreatic body, possible cystic component based on high T2 signal, but suspicion for internal septation and probably some internal enhancement.  MRI March 2021 Pancreas: Cystic lesion along the pancreatic body probably communicates with the dorsal pancreatic duct and measures 1.6 by 1.6 by 1.7 cm, with some internal septations/complexity, with potential faint internal enhancement.   Blood work December 2020 shows normal LFTs, normal lipase, normal CBC  She was found to have hepatocellular cancer right lobe of liver around 2016 and eventually underwent right hepatectomy.  Pathology showed a poorly differentiated 8.5 cm hepatocellular cancer with negative margins, lymphovascular invasion was identified.  Portal lymph nodes were negative     Review of systems: Pertinent positive and negative review of systems were noted in the above HPI section. All other review negative.   Past Medical History:  Diagnosis Date  . Arthritis    DDD. Right shoulder"is frozen"-limited ROM. osteoporosis.  . Blue toe  syndrome ([place]) 11/27/2014   Dr. Claudia Pollock evaluating  . CHF (congestive heart failure) ([place])   . Complication of anesthesia   . COPD (chronic obstructive pulmonary disease) ([place])   . Fibromyalgia   . Fracture of rib of right side    hx "osteoporosis"- states her dog nudge her on the side, next day developed great pain and was told has a fracture rib.  . Liver cancer ([place]) 12/2014  . Macular degeneration    R eye  . Osteoporosis   . PONV (postoperative nausea and vomiting)    nausea,severe vomiting after 01-13-15 portal vein embolization  . Productive cough   . Retina disorder    L eye, vision distorted, edema  . Spine fracture due to birth trauma   . TMJ disease   . Wears glasses     Past Surgical History:  Procedure Laterality Date  . ANGIOPLASTY  2008   no stents required, no follow-up with cardiologist, no recurrent chest pain  . CATARACT EXTRACTION, BILATERAL Bilateral   . ESOPHAGOGASTRODUODENOSCOPY (EGD) WITH PROPOFOL N/A 06/25/2015   Procedure: ESOPHAGOGASTRODUODENOSCOPY (EGD) WITH PROPOFOL;  Surgeon: Milus Banister, MD;  [facility];  Service: Endoscopy;  Laterality: N/A;  stent removal   . EYE SURGERY     cornea surgery  . INTRAMEDULLARY (IM) NAIL INTERTROCHANTERIC Right 12/04/2017   Procedure: INTRAMEDULLARY (IM) NAIL INTERTROCHANTRIC;  Surgeon: Rod Can, MD;  Location: [facility];  Service: Orthopedics;  Laterality: Right;  . IR RADIOLOGIST EVAL & MGMT  03/29/2017  . KIDNEY STONE SURGERY    . LAPAROSCOPIC PARTIAL  HEPATECTOMY N/A 03/04/2015   Procedure: DIAGNOSTIC LAPAROSCOPY, EXTENDED RIGHT HEPATECTOMY, WITH INTRAOPERATIVE ULTRASOUND;  Surgeon: Stark Klein, MD;  Location: [facility];  Service: General;  Laterality: N/A;  . PARTIAL HYSTERECTOMY    . portal vein embolization     01-13-15 -Dr. Cathlean Sauer.    Current Outpatient Medications  Medication Sig Dispense Refill  . acetaminophen (TYLENOL) 325 MG tablet Take 2 tablets (650 mg total) by mouth  every 6 (six) hours as needed for mild pain (or Fever >/= 101).    . bisacodyl (DULCOLAX) 10 MG suppository Place 1 suppository (10 mg total) rectally daily as needed for moderate constipation. 12 suppository 0  . busPIRone (BUSPAR) 15 MG tablet Take 15 mg by mouth 3 (three) times daily.    . Cholecalciferol (VITAMIN D3) 5000 units CAPS Take 5,000 Units by mouth daily as needed.     . DULoxetine (CYMBALTA) 30 MG capsule Take 1 tablet by mouth daily.    . fluticasone (FLONASE) 50 MCG/ACT nasal spray Place 2 sprays into both nostrils daily. 16 g 0  . Glycopyrrolate-Formoterol (BEVESPI AEROSPHERE) 9-4.8 MCG/ACT AERO Inhale 2 puffs into the lungs 2 (two) times daily. 1 Inhaler 11  . HYDROmorphone (DILAUDID) 2 MG tablet hydromorphone 2 mg tablet  Take One pill PO Q 7AM, 3PM,10 PM    . LORazepam (ATIVAN) 0.5 MG tablet     . melatonin 5 MG TABS melatonin 5 mg tablet  Take 1 tablet every day by oral route at bedtime.    . Multiple Vitamins-Minerals (CENTRUM SILVER ULTRA WOMENS PO) Take 1 tablet by mouth daily.     . polyethylene glycol (MIRALAX / GLYCOLAX) packet Take 17 g by mouth daily as needed. 26 each 6  . senna-docusate (SENOKOT-S) 8.6-50 MG tablet Take 1 tablet by mouth 2 (two) times daily as needed for mild constipation. 60 tablet 12  . vitamin B-12 (CYANOCOBALAMIN) 1000 MCG tablet Take 1,000 mcg by mouth daily as needed.     . mupirocin ointment (BACTROBAN) 2 % Apply 1 application topically 2 (two) times daily. (Patient not taking: Reported on 08/23/2019) 30 g 1   No current facility-administered medications for this visit.    Allergies as of 08/23/2019 - Review Complete 08/23/2019  Allergen Reaction Noted  . Zoloft [sertraline hcl]  07/10/2017  . Amoxicillin-pot clavulanate Nausea Only 01/09/2015  . Clindamycin/lincomycin Other (See Comments) 12/21/2016  . Paxil [paroxetine hcl]  08/23/2019  . Paroxetine hcl Rash 01/18/2011  . Sulfa antibiotics Rash 01/18/2011    Family History   Problem Relation Age of Onset  . Hypertension Mother   . Atrial fibrillation Mother   . Heart disease Mother        after age 18  . Breast cancer Mother   . Stroke Father   . Dementia Father   . Peptic Ulcer Father   . Heart disease Brother        After age 63- A-Fib  . Heart attack Brother     Social History   Socioeconomic History  . Marital status: Divorced    Spouse name: Not on file  . Number of children: 3  . Years of education: Not on file  . Highest education level: Not on file  Occupational History  . Occupation: retired  Tobacco Use  . Smoking status: Former Smoker    Packs/day: 0.50    Years: 45.00    Pack years: 22.50    Types: Cigarettes    Quit date: 02/20/2018  Years since quitting: 1.5  . Smokeless tobacco: Never Used  Substance and Sexual Activity  . Alcohol use: No    Alcohol/week: 0.0 standard drinks  . Drug use: No  . Sexual activity: Not on file  Other Topics Concern  . Not on file  Social History Narrative  . Not on file   Social Determinants of Health   Financial Resource Strain:   . Difficulty of Paying Living Expenses:   Food Insecurity:   . Worried About Charity fundraiser in the Last Year:   . Arboriculturist in the Last Year:   Transportation Needs:   . Film/video editor (Medical):   Marland Kitchen Lack of Transportation (Non-Medical):   Physical Activity:   . Days of Exercise per Week:   . Minutes of Exercise per Session:   Stress:   . Feeling of Stress :   Social Connections:   . Frequency of Communication with Friends and Family:   . Frequency of Social Gatherings with Friends and Family:   . Attends Religious Services:   . Active Member of Clubs or Organizations:   . Attends Archivist Meetings:   Marland Kitchen Marital Status:   Intimate Partner Violence:   . Fear of Current or Ex-Partner:   . Emotionally Abused:   Marland Kitchen Physically Abused:   . Sexually Abused:      Physical Exam: BP (!) 154/92 (BP Location: Left Arm,  Patient Position: Sitting, Cuff Size: Normal)   Pulse 84 Comment: irregular  Ht 4' 10.5" (1.486 m) Comment: height measured without shoes  Wt 112 lb 4 oz (50.9 kg)   BMI 23.06 kg/m  Constitutional: generally well-appearing Psychiatric: alert and oriented x3 Eyes: extraocular movements intact Mouth: oral pharynx moist, no lesions Neck: supple no lymphadenopathy Cardiovascular: heart regular rate and rhythm Lungs: clear to auscultation bilaterally Abdomen: soft, nontender, nondistended, no obvious ascites, no peritoneal signs, normal bowel sounds Extremities: no lower extremity edema bilaterally Skin: no lesions on visible extremities   Assessment and plan: 75 y.o. female with incidental pancreatic cyst  She was here with her daughter today and the 3 of [place] had a nice discussion about incidental pancreatic cyst.  I explained her I think it is highly unlikely that her pancreatic cyst will ever cause her any health problems.  There was a question of some vague internal enhancement in 2016 and there is still the question in 2021.  The cyst is still less than 2 cm.  I told her I think it would be perfectly safe to either abandon surveillance completely or repeat MRI in 2 years from her most recent one which would be March 2023.  We agreed to go ahead with surveillance MRI in 2 years.  We briefly discussed endoscopic ultrasound testing with fine-needle aspiration I told him I thought the benefit of that examination would be quite small and it could be complex given her extreme kyphosis, she would require airway intubation general anesthesia during the procedure.  Please see the "Patient Instructions" section for addition details about the plan.   Owens Loffler, MD [facility] 08/23/2019, 1:55 PM  Cc: Roetta Sessions*  Total time on date of encounter was 45  minutes (this included time spent preparing to see the patient reviewing records; obtaining and/or reviewing  separately obtained history; performing a medically appropriate exam and/or evaluation; counseling and educating the patient and family if present; ordering medications, tests or procedures if applicable; and documenting clinical information in the health  record).

## 2019-08-23 NOTE — Patient Instructions (Signed)
If you are age 75 or older, your body mass index should be between 23-30. Your Body mass index is 23.06 kg/m. If this is out of the aforementioned range listed, please consider follow up with your Primary Care Provider.  If you are age 52 or younger, your body mass index should be between 19-25. Your Body mass index is 23.06 kg/m. If this is out of the aformentioned range listed, please consider follow up with your Primary Care Provider.   You will need to have a MRI of pancreas with MRCP in March 2023. Someone from our office will contact you when it is time to schedule.  Thank you for entrusting me with your care and choosing [facility].  Dr Ardis Hughs

## 2019-08-26 DIAGNOSIS — K5903 Drug induced constipation: Secondary | ICD-10-CM | POA: Diagnosis not present

## 2019-08-26 DIAGNOSIS — F5101 Primary insomnia: Secondary | ICD-10-CM | POA: Diagnosis not present

## 2019-08-26 DIAGNOSIS — G8929 Other chronic pain: Secondary | ICD-10-CM | POA: Diagnosis not present

## 2019-09-02 DIAGNOSIS — K5903 Drug induced constipation: Secondary | ICD-10-CM | POA: Diagnosis not present

## 2019-09-02 DIAGNOSIS — F5109 Other insomnia not due to a substance or known physiological condition: Secondary | ICD-10-CM | POA: Diagnosis not present

## 2019-09-02 DIAGNOSIS — M545 Low back pain: Secondary | ICD-10-CM | POA: Diagnosis not present

## 2019-09-20 DIAGNOSIS — F5109 Other insomnia not due to a substance or known physiological condition: Secondary | ICD-10-CM | POA: Diagnosis not present

## 2019-09-25 DIAGNOSIS — R209 Unspecified disturbances of skin sensation: Secondary | ICD-10-CM | POA: Diagnosis not present

## 2019-09-25 DIAGNOSIS — L603 Nail dystrophy: Secondary | ICD-10-CM | POA: Diagnosis not present

## 2019-09-25 DIAGNOSIS — B351 Tinea unguium: Secondary | ICD-10-CM | POA: Diagnosis not present

## 2019-09-25 DIAGNOSIS — I739 Peripheral vascular disease, unspecified: Secondary | ICD-10-CM | POA: Diagnosis not present

## 2019-10-03 DIAGNOSIS — Z79899 Other long term (current) drug therapy: Secondary | ICD-10-CM | POA: Diagnosis not present

## 2019-10-18 DIAGNOSIS — F5109 Other insomnia not due to a substance or known physiological condition: Secondary | ICD-10-CM | POA: Diagnosis not present

## 2019-11-15 DIAGNOSIS — F5109 Other insomnia not due to a substance or known physiological condition: Secondary | ICD-10-CM | POA: Diagnosis not present

## 2019-11-18 DIAGNOSIS — R413 Other amnesia: Secondary | ICD-10-CM | POA: Diagnosis not present

## 2019-11-18 DIAGNOSIS — R0981 Nasal congestion: Secondary | ICD-10-CM | POA: Diagnosis not present

## 2019-12-12 DIAGNOSIS — Z79899 Other long term (current) drug therapy: Secondary | ICD-10-CM | POA: Diagnosis not present

## 2019-12-13 DIAGNOSIS — F5109 Other insomnia not due to a substance or known physiological condition: Secondary | ICD-10-CM | POA: Diagnosis not present

## 2019-12-16 DIAGNOSIS — R413 Other amnesia: Secondary | ICD-10-CM | POA: Diagnosis not present

## 2019-12-16 DIAGNOSIS — F5109 Other insomnia not due to a substance or known physiological condition: Secondary | ICD-10-CM | POA: Diagnosis not present

## 2019-12-19 DIAGNOSIS — B351 Tinea unguium: Secondary | ICD-10-CM | POA: Diagnosis not present

## 2019-12-19 DIAGNOSIS — I739 Peripheral vascular disease, unspecified: Secondary | ICD-10-CM | POA: Diagnosis not present

## 2019-12-19 DIAGNOSIS — R209 Unspecified disturbances of skin sensation: Secondary | ICD-10-CM | POA: Diagnosis not present

## 2019-12-19 DIAGNOSIS — L603 Nail dystrophy: Secondary | ICD-10-CM | POA: Diagnosis not present

## 2019-12-23 NOTE — Progress Notes (Signed)
[place]   [phone] [phone]   Clinic Follow up Note   Patient Care Team: Roetta Sessions, NP as PCP - General (Nurse Practitioner) Buford Dresser, MD as PCP - Cardiology (Cardiology) Elam Dutch, MD as Consulting Physician (Vascular Surgery) Stark Klein, MD as Consulting Physician (General Surgery) Truitt Merle, MD as Consulting Physician (Hematology) Suella Broad, MD as Consulting Physician (Physical Medicine and Rehabilitation) Valinda Party, MD as Consulting Physician (Rheumatology) Tanda Rockers, MD as Consulting Physician (Pulmonary Disease) Abbie Sons, MD as Referring Physician (Psychiatry)  Date of Service:  12/25/2019  CHIEF COMPLAINT: F/u on [place]  SUMMARY OF ONCOLOGIC HISTORY: Oncology History Overview Note  Hepatocellular carcinoma ([place])   Staging form: Liver (Excluding Intrahepatic Bile Ducts), AJCC 7th Edition     Clinical stage from 12/30/2014: Stage Unknown (T1, NX, M0) - Unsigned     Hepatocellular carcinoma ([place])  12/02/2014 Imaging   Liver MRI showed a large lesion in segment 5 and 40, highly suspicious for malignancy. And a 1.2 cm indeterminate pancreatic body lesion. Mildly enlarged portocaval lymph node. Cholelithiasis.   12/19/2014 Initial Diagnosis   Hepatocellular carcinoma ([place])   12/19/2014 Initial Biopsy   Liver mass biopsy showed parathyroid carcinoma, positive for hep par 1, cytokeratin 8/18, cytokeratin 7 and MOC-31.   12/25/2014 Imaging   CT chest showed a 3 mm groundglass probably nodule in the right upper lobe, mild right pericardial phrenic lymphadenopathy, no definitive evidence of metastasis.   01/13/2015 Procedure   Liquid embolization of the right portal venous system by Interventional Radiologist Dr. Laurence Ferrari   03/04/2015 Surgery   Liver right lobe hepatectomy, and the portal lymph nodes biopsy   03/04/2015 Pathology Results   Hepatocellular carcinoma,  poorly differentiated, spanning 8.5 cm, surgical margins were negative, lymphovascular invasion is identified, for portal lymph nodes were negative.    02/29/2016 Imaging   CT chest w contrast 02/29/2016 IMPRESSION: 1. Status post partial right hepatectomy, without recurrent or metastatic disease. 2. Removal of common duct stent since 06/10/2015. Upper normal to mildly dilated common duct after cholecystectomy. Recommend attention on follow-up. 3. Similar pancreatic head lesion, as detailed on prior MRI. 4. Pelvic floor laxity with cystocele. 5.  Aortic atherosclerosis.   10/14/2016 Imaging   MRI Abdomen W WO Contrast 10/14/16 IMPRESSION: 1. Stable appearance of the liver status post right hepatectomy. No evidence of recurrent tumor or metastatic disease. 2. Suggested slight interval enlargement of known cystic pancreatic mass, likely communicating with the main pancreatic duct and probably reflecting an intraductal papillary mucinous neoplasm. This demonstrates no aggressive characteristics, and continued surveillance suggested.    10/20/2017 Imaging   10/20/2017 MRI Abdomen IMPRESSION: 1. Stable appearance of the liver status post right hepatectomy. No evidence of recurrent tumor or metastatic disease. 2. Unchanged appearance of cystic pancreatic lesion which appears to communicate with the main pancreatic duct without main duct dilatation. Findings are compatible with intraductal papillary mucinous neoplasm. Continued interval follow-up is advised. The next follow-up examination should be obtained in 12 months. This recommendation follows ACR consensus guidelines: Management of Incidental Pancreatic Cysts: A White Paper of the ACR Incidental Findings Committee. J Am Coll Radiol [phone].   12/11/2018 Imaging   CT AP w contrast Hepatobiliary: Stable appearance of postsurgical changes of partial liver resection. cholecystectomy. There is dilatation of the common bile duct  similar to prior CT.  IMPRESSION: 1. No acute intra-abdominal or pelvic pathology. 2. Sigmoid diverticulosis. No bowel obstruction or active inflammation. Normal appendix.  06/17/2019 Imaging   IMPRESSION: 1. Mild growth of the suspected intraductal papillary mucinous neoplasm of the pancreatic body, now measuring 1.6 by 1.6 by 1.7 cm, previously 1.5 by 1.3 by 1.5 cm by my measurements. However, this does not qualify as formal "interval growth " based on established criteria. Possibilities for follow up at this point include annual surveillance pancreatic protocol CT or MRI, or endoscopic ultrasound/fine-needle aspiration. 2. Bilateral sacral insufficiency fractures. 3. Prior right hepatectomy. No findings of recurrent hepatocellular carcinoma. 4. Sigmoid colon diverticulosis. 5. Multilevel compression fractures of the lower thoracic and lumbar spine with vertebral augmentation at some levels. 6.  Aortic Atherosclerosis (ICD10-I70.0).      CURRENT THERAPY:  Surveillance  INTERVAL HISTORY:  ESPARANZA Bailey is here for a follow up of [place]. She was last seen by me in 03/2018 and seen by NP Lacie in interim, last in 06/2019. She presents to the clinic with her daughter. I reviewed her medication list with her. She is taking Vit D occasionally. She notes she is doing better given multiple fractures with chronic back pain on Dilaudid. She is s/p Kyphoplasty which helped her pain as well. She still has upper abdominal pain daily. It feels sore when she touches her upper abdomen. Her daughter notes she has been bleeding more when injured and wonders was this related to her liver cancer. She notes she is getting dental filling tomorrow of her front lower teeth.    REVIEW OF SYSTEMS:   Constitutional: Denies fevers, chills or abnormal weight loss Eyes: Denies blurriness of vision Ears, nose, mouth, throat, and face: Denies mucositis or sore throat Respiratory: Denies cough, dyspnea  or wheezes Cardiovascular: Denies palpitation, chest discomfort or lower extremity swelling Gastrointestinal:  Denies nausea, heartburn or change in bowel habits (+) Upper abdominal pain daily  Skin: Denies abnormal skin rashes MSK: (+) Chronic back pain improved (+) decreased ROM in upper extremity  Lymphatics: Denies new lymphadenopathy or easy bruising Neurological:Denies numbness, tingling or new weaknesses Behavioral/Psych: Mood is stable, no new changes  All other systems were reviewed with the patient and are negative.  MEDICAL HISTORY:  Past Medical History:  Diagnosis Date  . Arthritis    DDD. Right shoulder"is frozen"-limited ROM. osteoporosis.  . Blue toe syndrome ([place]) 11/27/2014   Dr. Claudia Pollock evaluating  . CHF (congestive heart failure) ([place])   . Complication of anesthesia   . COPD (chronic obstructive pulmonary disease) ([place])   . Fibromyalgia   . Fracture of rib of right side    hx "osteoporosis"- states her dog nudge her on the side, next day developed great pain and was told has a fracture rib.  . Liver cancer ([place]) 12/2014  . Macular degeneration    R eye  . Osteoporosis   . PONV (postoperative nausea and vomiting)    nausea,severe vomiting after 01-13-15 portal vein embolization  . Productive cough   . Retina disorder    L eye, vision distorted, edema  . Spine fracture due to birth trauma   . TMJ disease   . Wears glasses     SURGICAL HISTORY: Past Surgical History:  Procedure Laterality Date  . ANGIOPLASTY  2008   no stents required, no follow-up with cardiologist, no recurrent chest pain  . CATARACT EXTRACTION, BILATERAL Bilateral   . ESOPHAGOGASTRODUODENOSCOPY (EGD) WITH PROPOFOL N/A 06/25/2015   Procedure: ESOPHAGOGASTRODUODENOSCOPY (EGD) WITH PROPOFOL;  Surgeon: Milus Banister, MD;  Location: [facility];  Service: Endoscopy;  Laterality: N/A;  stent removal   .  EYE SURGERY     cornea surgery  . INTRAMEDULLARY (IM) NAIL INTERTROCHANTERIC Right  12/04/2017   Procedure: INTRAMEDULLARY (IM) NAIL INTERTROCHANTRIC;  Surgeon: Rod Can, MD;  Location: [facility];  Service: Orthopedics;  Laterality: Right;  . IR RADIOLOGIST EVAL & MGMT  03/29/2017  . KIDNEY STONE SURGERY    . LAPAROSCOPIC PARTIAL HEPATECTOMY N/A 03/04/2015   Procedure: DIAGNOSTIC LAPAROSCOPY, EXTENDED RIGHT HEPATECTOMY, WITH INTRAOPERATIVE ULTRASOUND;  Surgeon: Stark Klein, MD;  Location: [facility];  Service: General;  Laterality: N/A;  . PARTIAL HYSTERECTOMY    . portal vein embolization     01-13-15 -Dr. Cathlean Sauer.    I have reviewed the social history and family history with the patient and they are unchanged from previous note.  ALLERGIES:  is allergic to zoloft [sertraline hcl], amoxicillin-pot clavulanate, clindamycin/lincomycin, paxil [paroxetine hcl], paroxetine hcl, and sulfa antibiotics.  MEDICATIONS:  Current Outpatient Medications  Medication Sig Dispense Refill  . busPIRone (BUSPAR) 10 MG tablet Take 20 mg by mouth 3 (three) times daily.    Marland Kitchen HYDROcodone-acetaminophen (NORCO) 10-325 MG tablet Take 1 tablet by mouth in the morning, at noon, in the evening, and at bedtime.    Marland Kitchen acetaminophen (TYLENOL) 325 MG tablet Take 2 tablets (650 mg total) by mouth every 6 (six) hours as needed for mild pain (or Fever >/= 101).    . bisacodyl (DULCOLAX) 10 MG suppository Place 1 suppository (10 mg total) rectally daily as needed for moderate constipation. 12 suppository 0  . DULoxetine (CYMBALTA) 30 MG capsule Take 1 tablet by mouth daily.    Marland Kitchen LORazepam (ATIVAN) 0.5 MG tablet     . melatonin 5 MG TABS melatonin 5 mg tablet  Take 1 tablet every day by oral route at bedtime.    . senna-docusate (SENOKOT-S) 8.6-50 MG tablet Take 1 tablet by mouth 2 (two) times daily as needed for mild constipation. 60 tablet 12   No current facility-administered medications for this visit.    PHYSICAL EXAMINATION: ECOG PERFORMANCE STATUS: 2 - Symptomatic, <50% confined to  bed  Vitals:   12/25/19 1101  BP: (!) 155/90  Pulse: 93  Resp: 18  Temp: 97.7 F (36.5 C)  SpO2: 91%   Filed Weights   12/25/19 1101  Weight: 114 lb 8 oz (51.9 kg)    GENERAL:alert, no distress and comfortable SKIN: skin color, texture, turgor are normal, no rashes or significant lesions EYES: normal, Conjunctiva are pink and non-injected, sclera clear  NECK: supple, thyroid normal size, non-tender, without nodularity LYMPH:  no palpable lymphadenopathy in the cervical, axillary  LUNGS: clear to auscultation and percussion with normal breathing effort HEART: regular rate & rhythm and no murmurs and no lower extremity edema ABDOMEN:abdomen soft, non-tender and normal bowel sounds (+) Surgical incision healed well with scar tissue (+) Right abdominal tenderness  Musculoskeletal:no cyanosis of digits and no clubbing (+) Chest deformity from prior fracture  NEURO: alert & oriented x 3 with fluent speech, no focal motor/sensory deficits BREAST: No palpable mass, nodules or adenopathy bilaterally. Breast exam benign.  LABORATORY DATA:  I have reviewed the data as listed CBC Latest Ref Rng & Units 12/25/2019 02/19/2019 12/18/2018  WBC 4.0 - 10.5 K/uL 5.9 8.3 7.0  Hemoglobin 12.0 - 15.0 g/dL 14.6 14.7 14.1  Hematocrit 36 - 46 % 46.3(H) 46.5(H) 43.1  Platelets 150 - 400 K/uL 212 286 248     CMP Latest Ref Rng & Units 12/25/2019 06/17/2019 02/19/2019  Glucose 70 - 99 mg/dL 99 -  133(H)  BUN 8 - 23 mg/dL 15 - 10  Creatinine 0.44 - 1.00 mg/dL 0.70 0.60 0.51  Sodium 135 - 145 mmol/L 135 - 132(L)  Potassium 3.5 - 5.1 mmol/L 4.2 - 4.7  Chloride 98 - 111 mmol/L 94(L) - 93(L)  CO2 22 - 32 mmol/L 37(H) - 26  Calcium 8.9 - 10.3 mg/dL 9.2 - 9.0  Total Protein 6.5 - 8.1 g/dL 7.1 - 7.0  Total Bilirubin 0.3 - 1.2 mg/dL 0.6 - 0.9  Alkaline Phos 38 - 126 U/L 75 - 121  AST 15 - 41 U/L 16 - 38  ALT 0 - 44 U/L 11 - 36      RADIOGRAPHIC STUDIES: I have personally reviewed the radiological images  as listed and agreed with the findings in the report. No results found.   ASSESSMENT & PLAN:  Tina Bailey is a 73 y.o. female with    1. Hepatocellular carcinoma, pT3bN0M0, stage IIIB, poorly differentiated  -Diagnosed in 11/2014. Treated with embolization and right hepatectomy. Currently on surveillance. -serial imaging showedno evidence of recurrence -From a cancer standpoint she is clinically doing well. Labs reviewed, CBC and CMP WNL. AFP still pending. Physical exam shows right abdominal tenderness which is chronic, no hepatomegaly.  -She is 5 years from her diagnosis.NED, risk of recurrence is low now. Continue surveillance. Will repeat Abdominal US in 2 weeks and before next visit. Will repeat MRI in 2023. -F/u in 6 month   2. Pancreatic lesion, benign -Relatively stable over many years, overall likley benign based on Dr Eugenia Pancoast evaluation.   3. H/o smoking (s/p cessation), COPD -Continue to f/u with PCP and Dr. Melvyn Novas  4. T11 compression fracture and multiple fractures and body pain  -She has had multiple fractures in her spine, sternum, and the right hip -She is S/p Kyphoplasty which improved her pain. Pain managed on low dose Dilaudid now.  -f/u ortho, Dr. Idamae Schuller  5. Osteoporosis -Stopped FORTEO in 2018 because she didn't want injections -Last DEXA was in 2017. Severe osteopenia noted on thoracic and lumbar CTs on 12/11/18.  -I discussed given her h/o multiple fractures I discussed oral or injection bisphosphonate to strengthen her bone. I gave her print out information of Oral Fosamax. She will think about it.  -I recommend she increase oral calcium and Vit D daily again.   6. Intermittent Upper Abdominal Pain  -She notes she has ongoing upper abdominal pain and not sure if this is related to her liver surgery.  -She has never had colonoscopy and does not want one. She has not had colon cancer screening before. I recommend Cologuard stool test with PCP, she is  agreeable.     Plan  -Copy note to her PCP Dr Domingo Cocking about Cologuard testing for colon cancer screening  -US Abdomen in 2 weeks for [facility] screening  -F/u in 6 months with lab and US Abdomen a few days before  No problem-specific Assessment & Plan notes found for this encounter.   Orders Placed This Encounter  Procedures  . US Abdomen Limited    Standing Status:   Future    Standing Expiration Date:   12/24/2020    Order Specific Question:   Reason for Exam (SYMPTOM  OR DIAGNOSIS REQUIRED)    Answer:   liver cancer screening    Order Specific Question:   Preferred imaging location?    Answer:   [facility]  . US Abdomen Limited    Standing Status:   Future  Standing Expiration Date:   12/24/2020    Order Specific Question:   Reason for Exam (SYMPTOM  OR DIAGNOSIS REQUIRED)    Answer:   liver cancer screening    Order Specific Question:   Preferred imaging location?    Answer:   [facility]   All questions were answered. The patient knows to call the clinic with any problems, questions or concerns. No barriers to learning was detected. The total time spent in the appointment was 30 minutes.     Truitt Merle, MD 12/25/2019   I, Joslyn Devon, am acting as scribe for Truitt Merle, MD.   I have reviewed the above documentation for accuracy and completeness, and I agree with the above.

## 2019-12-25 ENCOUNTER — Encounter: Payer: Self-pay | Admitting: Hematology

## 2019-12-25 ENCOUNTER — Telehealth: Payer: Self-pay | Admitting: Hematology

## 2019-12-25 ENCOUNTER — Inpatient Hospital Stay: Payer: Medicare Other | Attending: Hematology | Admitting: Hematology

## 2019-12-25 ENCOUNTER — Other Ambulatory Visit: Payer: Self-pay

## 2019-12-25 ENCOUNTER — Inpatient Hospital Stay: Payer: Medicare Other

## 2019-12-25 VITALS — BP 155/90 | HR 93 | Temp 97.7°F | Resp 18 | Ht 58.5 in | Wt 114.5 lb

## 2019-12-25 DIAGNOSIS — J449 Chronic obstructive pulmonary disease, unspecified: Secondary | ICD-10-CM | POA: Diagnosis not present

## 2019-12-25 DIAGNOSIS — Z79899 Other long term (current) drug therapy: Secondary | ICD-10-CM | POA: Diagnosis not present

## 2019-12-25 DIAGNOSIS — Z8505 Personal history of malignant neoplasm of liver: Secondary | ICD-10-CM | POA: Insufficient documentation

## 2019-12-25 DIAGNOSIS — I509 Heart failure, unspecified: Secondary | ICD-10-CM | POA: Insufficient documentation

## 2019-12-25 DIAGNOSIS — M810 Age-related osteoporosis without current pathological fracture: Secondary | ICD-10-CM | POA: Insufficient documentation

## 2019-12-25 DIAGNOSIS — C220 Liver cell carcinoma: Secondary | ICD-10-CM

## 2019-12-25 DIAGNOSIS — Z87891 Personal history of nicotine dependence: Secondary | ICD-10-CM | POA: Diagnosis not present

## 2019-12-25 LAB — CBC WITH DIFFERENTIAL/PLATELET
Abs Immature Granulocytes: 0 10*3/uL (ref 0.00–0.07)
Basophils Absolute: 0 10*3/uL (ref 0.0–0.1)
Basophils Relative: 1 %
Eosinophils Absolute: 0.1 10*3/uL (ref 0.0–0.5)
Eosinophils Relative: 2 %
HCT: 46.3 % — ABNORMAL HIGH (ref 36.0–46.0)
Hemoglobin: 14.6 g/dL (ref 12.0–15.0)
Immature Granulocytes: 0 %
Lymphocytes Relative: 24 %
Lymphs Abs: 1.4 10*3/uL (ref 0.7–4.0)
MCH: 30 pg (ref 26.0–34.0)
MCHC: 31.5 g/dL (ref 30.0–36.0)
MCV: 95.1 fL (ref 80.0–100.0)
Monocytes Absolute: 0.5 10*3/uL (ref 0.1–1.0)
Monocytes Relative: 8 %
Neutro Abs: 3.8 10*3/uL (ref 1.7–7.7)
Neutrophils Relative %: 65 %
Platelets: 212 10*3/uL (ref 150–400)
RBC: 4.87 MIL/uL (ref 3.87–5.11)
RDW: 13.2 % (ref 11.5–15.5)
WBC: 5.9 10*3/uL (ref 4.0–10.5)
nRBC: 0 % (ref 0.0–0.2)

## 2019-12-25 LAB — COMPREHENSIVE METABOLIC PANEL
ALT: 11 U/L (ref 0–44)
AST: 16 U/L (ref 15–41)
Albumin: 4 g/dL (ref 3.5–5.0)
Alkaline Phosphatase: 75 U/L (ref 38–126)
Anion gap: 4 — ABNORMAL LOW (ref 5–15)
BUN: 15 mg/dL (ref 8–23)
CO2: 37 mmol/L — ABNORMAL HIGH (ref 22–32)
Calcium: 9.2 mg/dL (ref 8.9–10.3)
Chloride: 94 mmol/L — ABNORMAL LOW (ref 98–111)
Creatinine, Ser: 0.7 mg/dL (ref 0.44–1.00)
GFR calc non Af Amer: >60 mL/min (ref >60)
Glucose, Bld: 99 mg/dL (ref 70–99)
Potassium: 4.2 mmol/L (ref 3.5–5.1)
Sodium: 135 mmol/L (ref 135–145)
Total Bilirubin: 0.6 mg/dL (ref 0.3–1.2)
Total Protein: 7.1 g/dL (ref 6.5–8.1)

## 2019-12-25 NOTE — Telephone Encounter (Signed)
Scheduled appointments per 10/6 los. Gave patient updated calendar.  

## 2019-12-26 LAB — AFP TUMOR MARKER: AFP, Serum, Tumor Marker: 3 ng/mL (ref 0.0–8.3)

## 2019-12-30 ENCOUNTER — Telehealth: Payer: Self-pay

## 2019-12-30 NOTE — Telephone Encounter (Signed)
-----   Message from Truitt Merle, MD sent at 12/27/2019  7:30 AM EDT ----- Please let pt know her tumor marker AFP was normal, good news.  Truitt Merle  12/27/2019

## 2019-12-30 NOTE — Telephone Encounter (Signed)
Tina Bailey's primary phone number is her daughter Tina Bailey's. I let her now Tina Bailey's AFP tumor marker was normal.  She verbalized understanding. She will really this message to her mother.

## 2020-01-03 DIAGNOSIS — F5109 Other insomnia not due to a substance or known physiological condition: Secondary | ICD-10-CM | POA: Diagnosis not present

## 2020-01-06 DIAGNOSIS — F5109 Other insomnia not due to a substance or known physiological condition: Secondary | ICD-10-CM | POA: Diagnosis not present

## 2020-01-06 DIAGNOSIS — R413 Other amnesia: Secondary | ICD-10-CM | POA: Diagnosis not present

## 2020-01-09 ENCOUNTER — Other Ambulatory Visit: Payer: Self-pay

## 2020-01-09 ENCOUNTER — Ambulatory Visit (HOSPITAL_COMMUNITY)
Admission: RE | Admit: 2020-01-09 | Discharge: 2020-01-09 | Disposition: A | Discharge status: Home / Self Care | Payer: Medicare Other | Source: Ambulatory Visit | Attending: Hematology | Admitting: Hematology

## 2020-01-09 DIAGNOSIS — C220 Liver cell carcinoma: Secondary | ICD-10-CM | POA: Diagnosis not present

## 2020-01-10 DIAGNOSIS — F5109 Other insomnia not due to a substance or known physiological condition: Secondary | ICD-10-CM | POA: Diagnosis not present

## 2020-01-13 DIAGNOSIS — M545 Low back pain, unspecified: Secondary | ICD-10-CM | POA: Diagnosis not present

## 2020-01-14 ENCOUNTER — Telehealth: Payer: Self-pay

## 2020-01-14 NOTE — Telephone Encounter (Signed)
[place] results pt aware

## 2020-01-14 NOTE — Telephone Encounter (Signed)
-----   Message from Truitt Merle, MD sent at 01/10/2020 10:40 AM EDT ----- Please let pt know her [place] results, she has know cirrhosis, no other new concerns, thanks   Truitt Merle  01/10/2020

## 2020-01-16 ENCOUNTER — Ambulatory Visit: Payer: Medicare Other | Admitting: Cardiology

## 2020-01-17 DIAGNOSIS — M797 Fibromyalgia: Secondary | ICD-10-CM | POA: Diagnosis not present

## 2020-01-17 DIAGNOSIS — I5032 Chronic diastolic (congestive) heart failure: Secondary | ICD-10-CM | POA: Diagnosis not present

## 2020-01-17 DIAGNOSIS — H3530 Unspecified macular degeneration: Secondary | ICD-10-CM | POA: Diagnosis not present

## 2020-01-17 DIAGNOSIS — Z9181 History of falling: Secondary | ICD-10-CM | POA: Diagnosis not present

## 2020-01-17 DIAGNOSIS — M150 Primary generalized (osteo)arthritis: Secondary | ICD-10-CM | POA: Diagnosis not present

## 2020-01-17 DIAGNOSIS — M546 Pain in thoracic spine: Secondary | ICD-10-CM | POA: Diagnosis not present

## 2020-01-17 DIAGNOSIS — I712 Thoracic aortic aneurysm, without rupture: Secondary | ICD-10-CM | POA: Diagnosis not present

## 2020-01-17 DIAGNOSIS — Z87891 Personal history of nicotine dependence: Secondary | ICD-10-CM | POA: Diagnosis not present

## 2020-01-17 DIAGNOSIS — C220 Liver cell carcinoma: Secondary | ICD-10-CM | POA: Diagnosis not present

## 2020-01-17 DIAGNOSIS — I272 Pulmonary hypertension, unspecified: Secondary | ICD-10-CM | POA: Diagnosis not present

## 2020-01-17 DIAGNOSIS — J449 Chronic obstructive pulmonary disease, unspecified: Secondary | ICD-10-CM | POA: Diagnosis not present

## 2020-01-17 DIAGNOSIS — Z79891 Long term (current) use of opiate analgesic: Secondary | ICD-10-CM | POA: Diagnosis not present

## 2020-01-17 DIAGNOSIS — G8929 Other chronic pain: Secondary | ICD-10-CM | POA: Diagnosis not present

## 2020-01-17 DIAGNOSIS — M810 Age-related osteoporosis without current pathological fracture: Secondary | ICD-10-CM | POA: Diagnosis not present

## 2020-01-23 DIAGNOSIS — H3530 Unspecified macular degeneration: Secondary | ICD-10-CM | POA: Diagnosis not present

## 2020-01-23 DIAGNOSIS — J449 Chronic obstructive pulmonary disease, unspecified: Secondary | ICD-10-CM | POA: Diagnosis not present

## 2020-01-23 DIAGNOSIS — C220 Liver cell carcinoma: Secondary | ICD-10-CM | POA: Diagnosis not present

## 2020-01-23 DIAGNOSIS — M150 Primary generalized (osteo)arthritis: Secondary | ICD-10-CM | POA: Diagnosis not present

## 2020-01-23 DIAGNOSIS — Z9181 History of falling: Secondary | ICD-10-CM | POA: Diagnosis not present

## 2020-01-23 DIAGNOSIS — Z87891 Personal history of nicotine dependence: Secondary | ICD-10-CM | POA: Diagnosis not present

## 2020-01-23 DIAGNOSIS — G8929 Other chronic pain: Secondary | ICD-10-CM | POA: Diagnosis not present

## 2020-01-23 DIAGNOSIS — Z79891 Long term (current) use of opiate analgesic: Secondary | ICD-10-CM | POA: Diagnosis not present

## 2020-01-23 DIAGNOSIS — I5032 Chronic diastolic (congestive) heart failure: Secondary | ICD-10-CM | POA: Diagnosis not present

## 2020-01-23 DIAGNOSIS — M546 Pain in thoracic spine: Secondary | ICD-10-CM | POA: Diagnosis not present

## 2020-01-23 DIAGNOSIS — I712 Thoracic aortic aneurysm, without rupture: Secondary | ICD-10-CM | POA: Diagnosis not present

## 2020-01-23 DIAGNOSIS — M797 Fibromyalgia: Secondary | ICD-10-CM | POA: Diagnosis not present

## 2020-01-23 DIAGNOSIS — I272 Pulmonary hypertension, unspecified: Secondary | ICD-10-CM | POA: Diagnosis not present

## 2020-01-23 DIAGNOSIS — M810 Age-related osteoporosis without current pathological fracture: Secondary | ICD-10-CM | POA: Diagnosis not present

## 2020-01-24 DIAGNOSIS — Z9181 History of falling: Secondary | ICD-10-CM | POA: Diagnosis not present

## 2020-01-24 DIAGNOSIS — G8929 Other chronic pain: Secondary | ICD-10-CM | POA: Diagnosis not present

## 2020-01-24 DIAGNOSIS — I272 Pulmonary hypertension, unspecified: Secondary | ICD-10-CM | POA: Diagnosis not present

## 2020-01-24 DIAGNOSIS — Z79891 Long term (current) use of opiate analgesic: Secondary | ICD-10-CM | POA: Diagnosis not present

## 2020-01-24 DIAGNOSIS — J449 Chronic obstructive pulmonary disease, unspecified: Secondary | ICD-10-CM | POA: Diagnosis not present

## 2020-01-24 DIAGNOSIS — M150 Primary generalized (osteo)arthritis: Secondary | ICD-10-CM | POA: Diagnosis not present

## 2020-01-24 DIAGNOSIS — I5032 Chronic diastolic (congestive) heart failure: Secondary | ICD-10-CM | POA: Diagnosis not present

## 2020-01-24 DIAGNOSIS — M546 Pain in thoracic spine: Secondary | ICD-10-CM | POA: Diagnosis not present

## 2020-01-24 DIAGNOSIS — M810 Age-related osteoporosis without current pathological fracture: Secondary | ICD-10-CM | POA: Diagnosis not present

## 2020-01-24 DIAGNOSIS — Z87891 Personal history of nicotine dependence: Secondary | ICD-10-CM | POA: Diagnosis not present

## 2020-01-24 DIAGNOSIS — H3530 Unspecified macular degeneration: Secondary | ICD-10-CM | POA: Diagnosis not present

## 2020-01-24 DIAGNOSIS — M797 Fibromyalgia: Secondary | ICD-10-CM | POA: Diagnosis not present

## 2020-01-24 DIAGNOSIS — C220 Liver cell carcinoma: Secondary | ICD-10-CM | POA: Diagnosis not present

## 2020-01-24 DIAGNOSIS — I712 Thoracic aortic aneurysm, without rupture: Secondary | ICD-10-CM | POA: Diagnosis not present

## 2020-01-27 DIAGNOSIS — I270 Primary pulmonary hypertension: Secondary | ICD-10-CM | POA: Diagnosis not present

## 2020-01-27 DIAGNOSIS — J449 Chronic obstructive pulmonary disease, unspecified: Secondary | ICD-10-CM | POA: Diagnosis not present

## 2020-01-28 DIAGNOSIS — G8929 Other chronic pain: Secondary | ICD-10-CM | POA: Diagnosis not present

## 2020-01-28 DIAGNOSIS — M150 Primary generalized (osteo)arthritis: Secondary | ICD-10-CM | POA: Diagnosis not present

## 2020-01-28 DIAGNOSIS — Z87891 Personal history of nicotine dependence: Secondary | ICD-10-CM | POA: Diagnosis not present

## 2020-01-28 DIAGNOSIS — M546 Pain in thoracic spine: Secondary | ICD-10-CM | POA: Diagnosis not present

## 2020-01-28 DIAGNOSIS — J449 Chronic obstructive pulmonary disease, unspecified: Secondary | ICD-10-CM | POA: Diagnosis not present

## 2020-01-28 DIAGNOSIS — I5032 Chronic diastolic (congestive) heart failure: Secondary | ICD-10-CM | POA: Diagnosis not present

## 2020-01-28 DIAGNOSIS — H3530 Unspecified macular degeneration: Secondary | ICD-10-CM | POA: Diagnosis not present

## 2020-01-28 DIAGNOSIS — I712 Thoracic aortic aneurysm, without rupture: Secondary | ICD-10-CM | POA: Diagnosis not present

## 2020-01-28 DIAGNOSIS — C220 Liver cell carcinoma: Secondary | ICD-10-CM | POA: Diagnosis not present

## 2020-01-28 DIAGNOSIS — Z9181 History of falling: Secondary | ICD-10-CM | POA: Diagnosis not present

## 2020-01-28 DIAGNOSIS — I272 Pulmonary hypertension, unspecified: Secondary | ICD-10-CM | POA: Diagnosis not present

## 2020-01-28 DIAGNOSIS — M797 Fibromyalgia: Secondary | ICD-10-CM | POA: Diagnosis not present

## 2020-01-28 DIAGNOSIS — Z79891 Long term (current) use of opiate analgesic: Secondary | ICD-10-CM | POA: Diagnosis not present

## 2020-01-28 DIAGNOSIS — M810 Age-related osteoporosis without current pathological fracture: Secondary | ICD-10-CM | POA: Diagnosis not present

## 2020-01-30 DIAGNOSIS — Z79891 Long term (current) use of opiate analgesic: Secondary | ICD-10-CM | POA: Diagnosis not present

## 2020-01-30 DIAGNOSIS — M810 Age-related osteoporosis without current pathological fracture: Secondary | ICD-10-CM | POA: Diagnosis not present

## 2020-01-30 DIAGNOSIS — J449 Chronic obstructive pulmonary disease, unspecified: Secondary | ICD-10-CM | POA: Diagnosis not present

## 2020-01-30 DIAGNOSIS — Z9181 History of falling: Secondary | ICD-10-CM | POA: Diagnosis not present

## 2020-01-30 DIAGNOSIS — M546 Pain in thoracic spine: Secondary | ICD-10-CM | POA: Diagnosis not present

## 2020-01-30 DIAGNOSIS — H3530 Unspecified macular degeneration: Secondary | ICD-10-CM | POA: Diagnosis not present

## 2020-01-30 DIAGNOSIS — I712 Thoracic aortic aneurysm, without rupture: Secondary | ICD-10-CM | POA: Diagnosis not present

## 2020-01-30 DIAGNOSIS — Z87891 Personal history of nicotine dependence: Secondary | ICD-10-CM | POA: Diagnosis not present

## 2020-01-30 DIAGNOSIS — C220 Liver cell carcinoma: Secondary | ICD-10-CM | POA: Diagnosis not present

## 2020-01-30 DIAGNOSIS — G8929 Other chronic pain: Secondary | ICD-10-CM | POA: Diagnosis not present

## 2020-01-30 DIAGNOSIS — I272 Pulmonary hypertension, unspecified: Secondary | ICD-10-CM | POA: Diagnosis not present

## 2020-01-30 DIAGNOSIS — M150 Primary generalized (osteo)arthritis: Secondary | ICD-10-CM | POA: Diagnosis not present

## 2020-01-30 DIAGNOSIS — M797 Fibromyalgia: Secondary | ICD-10-CM | POA: Diagnosis not present

## 2020-01-30 DIAGNOSIS — I5032 Chronic diastolic (congestive) heart failure: Secondary | ICD-10-CM | POA: Diagnosis not present

## 2020-02-03 DIAGNOSIS — I5032 Chronic diastolic (congestive) heart failure: Secondary | ICD-10-CM | POA: Diagnosis not present

## 2020-02-03 DIAGNOSIS — C220 Liver cell carcinoma: Secondary | ICD-10-CM | POA: Diagnosis not present

## 2020-02-03 DIAGNOSIS — I272 Pulmonary hypertension, unspecified: Secondary | ICD-10-CM | POA: Diagnosis not present

## 2020-02-03 DIAGNOSIS — Z79891 Long term (current) use of opiate analgesic: Secondary | ICD-10-CM | POA: Diagnosis not present

## 2020-02-03 DIAGNOSIS — I712 Thoracic aortic aneurysm, without rupture: Secondary | ICD-10-CM | POA: Diagnosis not present

## 2020-02-03 DIAGNOSIS — Z9181 History of falling: Secondary | ICD-10-CM | POA: Diagnosis not present

## 2020-02-03 DIAGNOSIS — M150 Primary generalized (osteo)arthritis: Secondary | ICD-10-CM | POA: Diagnosis not present

## 2020-02-03 DIAGNOSIS — Z87891 Personal history of nicotine dependence: Secondary | ICD-10-CM | POA: Diagnosis not present

## 2020-02-03 DIAGNOSIS — M797 Fibromyalgia: Secondary | ICD-10-CM | POA: Diagnosis not present

## 2020-02-03 DIAGNOSIS — M810 Age-related osteoporosis without current pathological fracture: Secondary | ICD-10-CM | POA: Diagnosis not present

## 2020-02-03 DIAGNOSIS — M546 Pain in thoracic spine: Secondary | ICD-10-CM | POA: Diagnosis not present

## 2020-02-03 DIAGNOSIS — J449 Chronic obstructive pulmonary disease, unspecified: Secondary | ICD-10-CM | POA: Diagnosis not present

## 2020-02-03 DIAGNOSIS — H3530 Unspecified macular degeneration: Secondary | ICD-10-CM | POA: Diagnosis not present

## 2020-02-03 DIAGNOSIS — G8929 Other chronic pain: Secondary | ICD-10-CM | POA: Diagnosis not present

## 2020-02-05 DIAGNOSIS — M797 Fibromyalgia: Secondary | ICD-10-CM | POA: Diagnosis not present

## 2020-02-05 DIAGNOSIS — M546 Pain in thoracic spine: Secondary | ICD-10-CM | POA: Diagnosis not present

## 2020-02-05 DIAGNOSIS — Z87891 Personal history of nicotine dependence: Secondary | ICD-10-CM | POA: Diagnosis not present

## 2020-02-05 DIAGNOSIS — I712 Thoracic aortic aneurysm, without rupture: Secondary | ICD-10-CM | POA: Diagnosis not present

## 2020-02-05 DIAGNOSIS — Z9181 History of falling: Secondary | ICD-10-CM | POA: Diagnosis not present

## 2020-02-05 DIAGNOSIS — M810 Age-related osteoporosis without current pathological fracture: Secondary | ICD-10-CM | POA: Diagnosis not present

## 2020-02-05 DIAGNOSIS — C220 Liver cell carcinoma: Secondary | ICD-10-CM | POA: Diagnosis not present

## 2020-02-05 DIAGNOSIS — G8929 Other chronic pain: Secondary | ICD-10-CM | POA: Diagnosis not present

## 2020-02-05 DIAGNOSIS — M150 Primary generalized (osteo)arthritis: Secondary | ICD-10-CM | POA: Diagnosis not present

## 2020-02-05 DIAGNOSIS — I272 Pulmonary hypertension, unspecified: Secondary | ICD-10-CM | POA: Diagnosis not present

## 2020-02-05 DIAGNOSIS — J449 Chronic obstructive pulmonary disease, unspecified: Secondary | ICD-10-CM | POA: Diagnosis not present

## 2020-02-05 DIAGNOSIS — Z79891 Long term (current) use of opiate analgesic: Secondary | ICD-10-CM | POA: Diagnosis not present

## 2020-02-05 DIAGNOSIS — H3530 Unspecified macular degeneration: Secondary | ICD-10-CM | POA: Diagnosis not present

## 2020-02-05 DIAGNOSIS — I5032 Chronic diastolic (congestive) heart failure: Secondary | ICD-10-CM | POA: Diagnosis not present

## 2020-02-07 DIAGNOSIS — F5109 Other insomnia not due to a substance or known physiological condition: Secondary | ICD-10-CM | POA: Diagnosis not present

## 2020-02-10 DIAGNOSIS — Z79891 Long term (current) use of opiate analgesic: Secondary | ICD-10-CM | POA: Diagnosis not present

## 2020-02-10 DIAGNOSIS — M810 Age-related osteoporosis without current pathological fracture: Secondary | ICD-10-CM | POA: Diagnosis not present

## 2020-02-10 DIAGNOSIS — I712 Thoracic aortic aneurysm, without rupture: Secondary | ICD-10-CM | POA: Diagnosis not present

## 2020-02-10 DIAGNOSIS — H3530 Unspecified macular degeneration: Secondary | ICD-10-CM | POA: Diagnosis not present

## 2020-02-10 DIAGNOSIS — Z9181 History of falling: Secondary | ICD-10-CM | POA: Diagnosis not present

## 2020-02-10 DIAGNOSIS — G8929 Other chronic pain: Secondary | ICD-10-CM | POA: Diagnosis not present

## 2020-02-10 DIAGNOSIS — M150 Primary generalized (osteo)arthritis: Secondary | ICD-10-CM | POA: Diagnosis not present

## 2020-02-10 DIAGNOSIS — C220 Liver cell carcinoma: Secondary | ICD-10-CM | POA: Diagnosis not present

## 2020-02-10 DIAGNOSIS — Z87891 Personal history of nicotine dependence: Secondary | ICD-10-CM | POA: Diagnosis not present

## 2020-02-10 DIAGNOSIS — I5032 Chronic diastolic (congestive) heart failure: Secondary | ICD-10-CM | POA: Diagnosis not present

## 2020-02-10 DIAGNOSIS — I272 Pulmonary hypertension, unspecified: Secondary | ICD-10-CM | POA: Diagnosis not present

## 2020-02-10 DIAGNOSIS — M797 Fibromyalgia: Secondary | ICD-10-CM | POA: Diagnosis not present

## 2020-02-10 DIAGNOSIS — M546 Pain in thoracic spine: Secondary | ICD-10-CM | POA: Diagnosis not present

## 2020-02-10 DIAGNOSIS — F5109 Other insomnia not due to a substance or known physiological condition: Secondary | ICD-10-CM | POA: Diagnosis not present

## 2020-02-10 DIAGNOSIS — J449 Chronic obstructive pulmonary disease, unspecified: Secondary | ICD-10-CM | POA: Diagnosis not present

## 2020-02-10 DIAGNOSIS — I270 Primary pulmonary hypertension: Secondary | ICD-10-CM | POA: Diagnosis not present

## 2020-02-12 DIAGNOSIS — M150 Primary generalized (osteo)arthritis: Secondary | ICD-10-CM | POA: Diagnosis not present

## 2020-02-12 DIAGNOSIS — M797 Fibromyalgia: Secondary | ICD-10-CM | POA: Diagnosis not present

## 2020-02-12 DIAGNOSIS — C220 Liver cell carcinoma: Secondary | ICD-10-CM | POA: Diagnosis not present

## 2020-02-12 DIAGNOSIS — M810 Age-related osteoporosis without current pathological fracture: Secondary | ICD-10-CM | POA: Diagnosis not present

## 2020-02-12 DIAGNOSIS — I272 Pulmonary hypertension, unspecified: Secondary | ICD-10-CM | POA: Diagnosis not present

## 2020-02-12 DIAGNOSIS — M546 Pain in thoracic spine: Secondary | ICD-10-CM | POA: Diagnosis not present

## 2020-02-12 DIAGNOSIS — J449 Chronic obstructive pulmonary disease, unspecified: Secondary | ICD-10-CM | POA: Diagnosis not present

## 2020-02-12 DIAGNOSIS — I712 Thoracic aortic aneurysm, without rupture: Secondary | ICD-10-CM | POA: Diagnosis not present

## 2020-02-12 DIAGNOSIS — Z9181 History of falling: Secondary | ICD-10-CM | POA: Diagnosis not present

## 2020-02-12 DIAGNOSIS — I5032 Chronic diastolic (congestive) heart failure: Secondary | ICD-10-CM | POA: Diagnosis not present

## 2020-02-12 DIAGNOSIS — G8929 Other chronic pain: Secondary | ICD-10-CM | POA: Diagnosis not present

## 2020-02-12 DIAGNOSIS — H3530 Unspecified macular degeneration: Secondary | ICD-10-CM | POA: Diagnosis not present

## 2020-02-12 DIAGNOSIS — Z79891 Long term (current) use of opiate analgesic: Secondary | ICD-10-CM | POA: Diagnosis not present

## 2020-02-12 DIAGNOSIS — Z87891 Personal history of nicotine dependence: Secondary | ICD-10-CM | POA: Diagnosis not present

## 2020-02-17 DIAGNOSIS — H3530 Unspecified macular degeneration: Secondary | ICD-10-CM | POA: Diagnosis not present

## 2020-02-17 DIAGNOSIS — J449 Chronic obstructive pulmonary disease, unspecified: Secondary | ICD-10-CM | POA: Diagnosis not present

## 2020-02-17 DIAGNOSIS — I712 Thoracic aortic aneurysm, without rupture: Secondary | ICD-10-CM | POA: Diagnosis not present

## 2020-02-17 DIAGNOSIS — Z79891 Long term (current) use of opiate analgesic: Secondary | ICD-10-CM | POA: Diagnosis not present

## 2020-02-17 DIAGNOSIS — Z87891 Personal history of nicotine dependence: Secondary | ICD-10-CM | POA: Diagnosis not present

## 2020-02-17 DIAGNOSIS — G8929 Other chronic pain: Secondary | ICD-10-CM | POA: Diagnosis not present

## 2020-02-17 DIAGNOSIS — C220 Liver cell carcinoma: Secondary | ICD-10-CM | POA: Diagnosis not present

## 2020-02-17 DIAGNOSIS — I5032 Chronic diastolic (congestive) heart failure: Secondary | ICD-10-CM | POA: Diagnosis not present

## 2020-02-17 DIAGNOSIS — M810 Age-related osteoporosis without current pathological fracture: Secondary | ICD-10-CM | POA: Diagnosis not present

## 2020-02-17 DIAGNOSIS — I272 Pulmonary hypertension, unspecified: Secondary | ICD-10-CM | POA: Diagnosis not present

## 2020-02-17 DIAGNOSIS — M546 Pain in thoracic spine: Secondary | ICD-10-CM | POA: Diagnosis not present

## 2020-02-17 DIAGNOSIS — M797 Fibromyalgia: Secondary | ICD-10-CM | POA: Diagnosis not present

## 2020-02-17 DIAGNOSIS — Z9181 History of falling: Secondary | ICD-10-CM | POA: Diagnosis not present

## 2020-02-17 DIAGNOSIS — M150 Primary generalized (osteo)arthritis: Secondary | ICD-10-CM | POA: Diagnosis not present

## 2020-02-19 DIAGNOSIS — C220 Liver cell carcinoma: Secondary | ICD-10-CM | POA: Diagnosis not present

## 2020-02-19 DIAGNOSIS — J449 Chronic obstructive pulmonary disease, unspecified: Secondary | ICD-10-CM | POA: Diagnosis not present

## 2020-02-19 DIAGNOSIS — M797 Fibromyalgia: Secondary | ICD-10-CM | POA: Diagnosis not present

## 2020-02-19 DIAGNOSIS — I712 Thoracic aortic aneurysm, without rupture: Secondary | ICD-10-CM | POA: Diagnosis not present

## 2020-02-19 DIAGNOSIS — Z87891 Personal history of nicotine dependence: Secondary | ICD-10-CM | POA: Diagnosis not present

## 2020-02-19 DIAGNOSIS — I5032 Chronic diastolic (congestive) heart failure: Secondary | ICD-10-CM | POA: Diagnosis not present

## 2020-02-19 DIAGNOSIS — M150 Primary generalized (osteo)arthritis: Secondary | ICD-10-CM | POA: Diagnosis not present

## 2020-02-19 DIAGNOSIS — G8929 Other chronic pain: Secondary | ICD-10-CM | POA: Diagnosis not present

## 2020-02-19 DIAGNOSIS — H3530 Unspecified macular degeneration: Secondary | ICD-10-CM | POA: Diagnosis not present

## 2020-02-19 DIAGNOSIS — M546 Pain in thoracic spine: Secondary | ICD-10-CM | POA: Diagnosis not present

## 2020-02-19 DIAGNOSIS — I272 Pulmonary hypertension, unspecified: Secondary | ICD-10-CM | POA: Diagnosis not present

## 2020-02-19 DIAGNOSIS — M810 Age-related osteoporosis without current pathological fracture: Secondary | ICD-10-CM | POA: Diagnosis not present

## 2020-02-19 DIAGNOSIS — Z79891 Long term (current) use of opiate analgesic: Secondary | ICD-10-CM | POA: Diagnosis not present

## 2020-02-19 DIAGNOSIS — Z9181 History of falling: Secondary | ICD-10-CM | POA: Diagnosis not present

## 2020-02-28 ENCOUNTER — Other Ambulatory Visit: Payer: Self-pay

## 2020-02-28 ENCOUNTER — Encounter: Payer: Self-pay | Admitting: Cardiology

## 2020-02-28 ENCOUNTER — Ambulatory Visit: Payer: Medicare Other | Admitting: Cardiology

## 2020-02-28 VITALS — BP 164/92 | HR 94 | Ht 63.0 in | Wt 110.8 lb

## 2020-02-28 DIAGNOSIS — I5032 Chronic diastolic (congestive) heart failure: Secondary | ICD-10-CM

## 2020-02-28 DIAGNOSIS — I712 Thoracic aortic aneurysm, without rupture, unspecified: Secondary | ICD-10-CM

## 2020-02-28 DIAGNOSIS — I10 Essential (primary) hypertension: Secondary | ICD-10-CM

## 2020-02-28 DIAGNOSIS — I272 Pulmonary hypertension, unspecified: Secondary | ICD-10-CM

## 2020-02-28 MED ORDER — AMLODIPINE BESYLATE 5 MG PO TABS: 5.0000 mg | ORAL_TABLET | Freq: Every day | ORAL | 3 refills | Status: DC

## 2020-02-28 NOTE — Progress Notes (Signed)
Cardiology Office Note:    Date:  02/28/2020   ID:  Tina Bailey, DOB 06/06/1945, MRN [id number]  PCP:  Roetta Sessions, NP  Cardiologist:  Buford Dresser, MD PhD  Referring MD: Roetta Sessions*   CC: follow up  History of Present Illness:    Tina Bailey is a 74 y.o. female with a hx of hepatocellular carcinoma s/p right hepatectomy 2016, chronic joint pain due to arthritis, history of tobacco abuse (~50 years), COPD who is seen in follow up today. She was initially seen as a new patient to me (last seen by Dr. Oval Linsey in 2016 for preop clearance) at the request of Roetta Sessions* for the evaluation and management of chronic diastolic heart failure on 04/13/18.  Cardiac history: She was admitted for LE edema in 02/2018. She had several fractures (femur, sternum) in the fall and noted several weeks after that she developed LE edema. Doppler negative for DVT. Echo done during that hospitalization was notable for grade 1 diastolic dysfunction, severe pulmonary hypertension, and right atrial mass. We reviewed today. She is adamant that she would never want (and would likely not be a candidate for) any cardiothoracic surgery. Her swelling improved with diuresis. She diuresed well in the hospital with lasix, but she is allergic to sulfa. She had a rash and itching which she attributed to lasix, but it did not improve once lasix was stopped. Changed to spironolactone, itching has not changed. Swelling much improved, only sock lines.   Imaging that admission also noted incidental TAA, nonruptured. Had grandmother with ruputured aortic aneurysm. Again states that she would not want CT surgery even if she was a candidate. Not indicated now given the size, but we discussed routine monitoring, declined.  Had nausea on spironolactone.   Today: Here with daughter [name] today.  In long term care facility, had intermittent BP monitoring until recently. Very  labile. Has had one reading >329 systolic, but many are well controlled. She was asymptomatic at the time. Now being monitored about twice a week. Has had 170s/100s at the peak but was 924 systolic this morning. Noted by her physical therapist that when she is active, her BP and oxygen is normal, but often reads abnormally.   No recent leg swelling. Weighed once a month. She does like salty food. Discussed compression stockings, leg elevation. Has chronic mild sock lines. Discussed side effects of amlodipine, what to watch for.  Denies chest pain, shortness of breath at rest or with normal exertion. No PND, orthopnea, LE edema or unexpected weight gain. No syncope or palpitations.  Past Medical History:  Diagnosis Date   Arthritis    DDD. Right shoulder"is frozen"-limited ROM. osteoporosis.   Blue toe syndrome ([place]) 11/27/2014   Dr. Claudia Pollock evaluating   CHF (congestive heart failure) (HCC)    Complication of anesthesia    COPD (chronic obstructive pulmonary disease) ([place])    Fibromyalgia    Fracture of rib of right side    hx "osteoporosis"- states her dog nudge her on the side, next day developed great pain and was told has a fracture rib.   Liver cancer ([place]) 12/2014   Macular degeneration    R eye   Osteoporosis    PONV (postoperative nausea and vomiting)    nausea,severe vomiting after 01-13-15 portal vein embolization   Productive cough    Retina disorder    L eye, vision distorted, edema   Spine fracture due to birth trauma    TMJ  disease    Wears glasses     Past Surgical History:  Procedure Laterality Date   ANGIOPLASTY  2008   no stents required, no follow-up with cardiologist, no recurrent chest pain   CATARACT EXTRACTION, BILATERAL Bilateral    ESOPHAGOGASTRODUODENOSCOPY (EGD) WITH PROPOFOL N/A 06/25/2015   Procedure: ESOPHAGOGASTRODUODENOSCOPY (EGD) WITH PROPOFOL;  Surgeon: Milus Banister, MD;  Location: [facility];  Service: Endoscopy;  Laterality: N/A;  stent  removal    EYE SURGERY     cornea surgery   INTRAMEDULLARY (IM) NAIL INTERTROCHANTERIC Right 12/04/2017   Procedure: INTRAMEDULLARY (IM) NAIL INTERTROCHANTRIC;  Surgeon: Rod Can, MD;  Location: [facility];  Service: Orthopedics;  Laterality: Right;   IR RADIOLOGIST EVAL & MGMT  03/29/2017   KIDNEY STONE SURGERY     LAPAROSCOPIC PARTIAL HEPATECTOMY N/A 03/04/2015   Procedure: DIAGNOSTIC LAPAROSCOPY, EXTENDED RIGHT HEPATECTOMY, WITH INTRAOPERATIVE ULTRASOUND;  Surgeon: Stark Klein, MD;  Location: [facility];  Service: General;  Laterality: N/A;   PARTIAL HYSTERECTOMY     portal vein embolization     01-13-15 -Dr. Cathlean Sauer.    Current Medications: Current Outpatient Medications on File Prior to Visit  Medication Sig   acetaminophen (TYLENOL) 325 MG tablet Take 2 tablets (650 mg total) by mouth every 6 (six) hours as needed for mild pain (or Fever >/= 101).   bisacodyl (DULCOLAX) 10 MG suppository Place 1 suppository (10 mg total) rectally daily as needed for moderate constipation.   busPIRone (BUSPAR) 10 MG tablet Take 20 mg by mouth 3 (three) times daily.   DULoxetine (CYMBALTA) 30 MG capsule Take 1 tablet by mouth daily.   HYDROcodone-acetaminophen (NORCO) 10-325 MG tablet Take 1 tablet by mouth in the morning, at noon, in the evening, and at bedtime.   LORazepam (ATIVAN) 0.5 MG tablet    melatonin 5 MG TABS melatonin 5 mg tablet  Take 1 tablet every day by oral route at bedtime.   senna-docusate (SENOKOT-S) 8.6-50 MG tablet Take 1 tablet by mouth 2 (two) times daily as needed for mild constipation.   No current facility-administered medications on file prior to visit.     Allergies:   Zoloft [sertraline hcl], Amoxicillin-pot clavulanate, Clindamycin/lincomycin, Paxil [paroxetine hcl], Paroxetine hcl, and Sulfa antibiotics   Social History   Tobacco Use   Smoking status: Former Smoker    Packs/day: 0.50    Years: 45.00    Pack years: 22.50    Types: Cigarettes    Quit  date: 02/20/2018    Years since quitting: 2.0   Smokeless tobacco: Never Used  Vaping Use   Vaping Use: Never used  Substance Use Topics   Alcohol use: No    Alcohol/week: 0.0 standard drinks   Drug use: No    Family History: The patient's family history includes Atrial fibrillation in her mother; Breast cancer in her mother; Dementia in her father; Heart attack in her brother; Heart disease in her brother and mother; Hypertension in her mother; Peptic Ulcer in her father; Stroke in her father.  ROS:   Please see the history of present illness.  Additional pertinent ROS: Constitutional: Negative for chills, fever, night sweats, unintentional weight loss  HENT: Negative for ear pain and hearing loss.   Eyes: Negative for loss of vision and eye pain.  Respiratory: Negative for cough, sputum, wheezing.   Cardiovascular: See HPI. Gastrointestinal: Negative for abdominal pain, melena, and hematochezia.  Genitourinary: Negative for dysuria and hematuria.  Musculoskeletal: Negative for falls and myalgias.  Skin:  Negative for itching and rash.  Neurological: Negative for focal weakness, focal sensory changes and loss of consciousness.  Endo/Heme/Allergies: Does not bruise/bleed easily.   EKGs/Labs/Other Studies Reviewed:    The following studies were reviewed today: Echo 03/20/18 - Left ventricle: The cavity size was normal. Systolic function was   normal. The estimated ejection fraction was in the range of 55%   to 60%. Wall motion was normal; there were no regional wall   motion abnormalities. Doppler parameters are consistent with   abnormal left ventricular relaxation (grade 1 diastolic   dysfunction). - Right ventricle: The cavity size was mildly dilated. Wall   thickness was normal. Systolic function was mildly reduced. - Right atrium: There was a mass in the atrial cavity adjacent to   the intra-atrial septum. - Tricuspid valve: There was moderate regurgitation. - Pulmonary  arteries: Systolic pressure was severely increased. PA   peak pressure: 60 mm Hg (S). - Pericardium, extracardiac: A small pericardial effusion was   identified. There is mitral inflow respiratory variability, but   no clear RA/RV collapse.   Impressions:   - Normal LV EF, grade 1 diastolic dysfunction.   There is a structure in the right atrial cavity adjacent to the   intra-atrial septum that cannot be well defined. Could be very   hypertrophied septum, though favor other etiology, cannot exclude   thrombus, myxoma, or other.   Severe pulmonary hypertension, with mild RV dysfunction and   dilation.   Small pericardial effusion, predominantly posterior. There is   mitral inflow variability, though this may be unrelated to   effusion. No RA/RV collapse seen.  Arterial duplex 02/20/18 Right: Total occlusion noted in the superficial femoral artery with reconstitution in the popliteal artery.  ABI 02/20/18 Right: Resting right ankle-brachial index indicates moderate right lower extremity arterial disease. The right toe-brachial index is abnormal.   Left: Resting left ankle-brachial index indicates mild left lower extremity arterial disease. The left toe-brachial index is abnormal.  Nuclear stress 01/12/15 Nuclear stress EF: 67%. There was no ST segment deviation noted during stress. The study is normal. This is a low risk study. The left ventricular ejection fraction is hyperdynamic (>65%).  EKG:  EKG is personally reviewed.   02/28/20: sinus rhythm with frequent PVCs at 94 bpm  Recent Labs: 12/25/2019: ALT 11; BUN 15; Creatinine, Ser 0.70; Hemoglobin 14.6; Platelets 212; Potassium 4.2; Sodium 135  Recent Lipid Panel    Component Value Date/Time   CHOL 188 01/09/2017 1109   TRIG 71.0 01/09/2017 1109   HDL 91.40 01/09/2017 1109   CHOLHDL 2 01/09/2017 1109   VLDL 14.2 01/09/2017 1109   LDLCALC 82 01/09/2017 1109    Physical Exam:    VS:  BP (!) 164/92   Pulse 94   Ht 5'  3" (1.6 m)   Wt 110 lb 12.8 oz (50.3 kg)   SpO2 96%   BMI 19.63 kg/m     Wt Readings from Last 3 Encounters:  02/28/20 110 lb 12.8 oz (50.3 kg)  12/25/19 114 lb 8 oz (51.9 kg)  08/23/19 112 lb 4 oz (50.9 kg)    GEN: pleasant, frail elderly woman in no acute distress. Kyphotic spine, protruding sternum chronically.  HEENT: Normal, moist mucous membranes NECK: No JVD CARDIAC: regular rhythm with frequent premature beats, normal S1 and S2, no rubs or gallops. No murmurs. VASCULAR: Radial and DP pulses 2+ bilaterally. No carotid bruits RESPIRATORY:  No rales, mild end expiratory wheezing, distant breath sounds. ABDOMEN:  Soft, non-tender, non-distended MUSCULOSKELETAL:  Ambulates independently SKIN: Warm and dry, trivial bilateral LE edema NEUROLOGIC:  Alert and oriented x 3. No focal neuro deficits noted. PSYCHIATRIC:  Normal affect   ASSESSMENT:    1. Essential hypertension   2. Chronic diastolic heart failure ([place])   3. Thoracic aortic aneurysm without rupture ([place])   4. Pulmonary hypertension, unspecified (HCC)    PLAN:    Chronic diastolic heart failure severe pulmonary hypertension on echo history of severe bilateral LE edema -doing much better from a swelling perspective -had nausea on spironolactone. -continue elevation, compression as tolerated  -continue tobacco abstinence -has previously endorsed that she does not want invasive testing (such as right heart cath) or surgery   Hypertension: new formal diagnosis today, though has had elevated readings before when in pain -we discussed many options for medications. They would like to try something that does not affect the kidneys first. Will start with 5 mg amlodipine. With history of swelling, would add chlorthalidone if edema is significant.  -Concern for beta blocker given COPD but could consider low dose metoprolol   Thoracic aortic aneurysm: as previously noted, she would not want surgery, does not wish to follow  for monitoring  Plan for follow up: 2 mos or sooner PRN  Buford Dresser, MD, PhD [place]  [facility]   Medication Adjustments/Labs and Tests Ordered: Current medicines are reviewed at length with the patient today.  Concerns regarding medicines are outlined above.  Orders Placed This Encounter  Procedures   EKG 12-Lead   Meds ordered this encounter  Medications   DISCONTD: amLODipine (NORVASC) 5 MG tablet    Sig: Take 1 tablet (5 mg total) by mouth daily.    Dispense:  90 tablet    Refill:  3    Patient Instructions  Medication Instructions:  Start Amlodipine 5 mg daily   *If you need a refill on your cardiac medications before your next appointment, please call your pharmacy*   Lab Work: None   Testing/Procedures: None   Follow-Up: At [facility], you and your health needs are our priority.  As part of our continuing mission to provide you with exceptional heart care, we have created designated Provider Care Teams.  These Care Teams include your primary Cardiologist (physician) and Advanced Practice Providers (APPs -  Physician Assistants and Nurse Practitioners) who all work together to provide you with the care you need, when you need it.  We recommend signing up for the patient portal called "MyChart".  Sign up information is provided on this After Visit Summary.  MyChart is used to connect with patients for Virtual Visits (Telemedicine).  Patients are able to view lab/test results, encounter notes, upcoming appointments, etc.  Non-urgent messages can be sent to your provider as well.   To learn more about what you can do with MyChart, go to [email or web address].    Your next appointment:   2 month(s)  The format for your next appointment:   In Person  Provider:   Buford Dresser, MD     Signed, Buford Dresser, MD PhD 02/28/2020    [place]

## 2020-02-28 NOTE — Patient Instructions (Signed)
Medication Instructions:  Start Amlodipine 5 mg daily   *If you need a refill on your cardiac medications before your next appointment, please call your pharmacy*   Lab Work: None   Testing/Procedures: None   Follow-Up: At [facility], you and your health needs are our priority.  As part of our continuing mission to provide you with exceptional heart care, we have created designated Provider Care Teams.  These Care Teams include your primary Cardiologist (physician) and Advanced Practice Providers (APPs -  Physician Assistants and Nurse Practitioners) who all work together to provide you with the care you need, when you need it.  We recommend signing up for the patient portal called "MyChart".  Sign up information is provided on this After Visit Summary.  MyChart is used to connect with patients for Virtual Visits (Telemedicine).  Patients are able to view lab/test results, encounter notes, upcoming appointments, etc.  Non-urgent messages can be sent to your provider as well.   To learn more about what you can do with MyChart, go to [email or web address].    Your next appointment:   2 month(s)  The format for your next appointment:   In Person  Provider:   Buford Dresser, MD

## 2020-03-06 DIAGNOSIS — F5109 Other insomnia not due to a substance or known physiological condition: Secondary | ICD-10-CM | POA: Diagnosis not present

## 2020-03-09 DIAGNOSIS — J449 Chronic obstructive pulmonary disease, unspecified: Secondary | ICD-10-CM | POA: Diagnosis not present

## 2020-03-09 DIAGNOSIS — I270 Primary pulmonary hypertension: Secondary | ICD-10-CM | POA: Diagnosis not present

## 2020-03-09 DIAGNOSIS — F5109 Other insomnia not due to a substance or known physiological condition: Secondary | ICD-10-CM | POA: Diagnosis not present

## 2020-03-19 DIAGNOSIS — Z03818 Encounter for observation for suspected exposure to other biological agents ruled out: Secondary | ICD-10-CM | POA: Diagnosis not present

## 2020-03-30 DIAGNOSIS — Z20822 Contact with and (suspected) exposure to covid-19: Secondary | ICD-10-CM | POA: Diagnosis not present

## 2020-03-30 DIAGNOSIS — R3915 Urgency of urination: Secondary | ICD-10-CM | POA: Diagnosis not present

## 2020-04-03 DIAGNOSIS — F5109 Other insomnia not due to a substance or known physiological condition: Secondary | ICD-10-CM | POA: Diagnosis not present

## 2020-04-03 DIAGNOSIS — F064 Anxiety disorder due to known physiological condition: Secondary | ICD-10-CM | POA: Diagnosis not present

## 2020-04-03 DIAGNOSIS — R69 Illness, unspecified: Secondary | ICD-10-CM | POA: Diagnosis not present

## 2020-04-09 DIAGNOSIS — G894 Chronic pain syndrome: Secondary | ICD-10-CM | POA: Diagnosis not present

## 2020-04-29 ENCOUNTER — Encounter: Payer: Self-pay | Admitting: Cardiology

## 2020-04-29 ENCOUNTER — Ambulatory Visit: Payer: Medicare HMO | Admitting: Cardiology

## 2020-04-29 ENCOUNTER — Other Ambulatory Visit: Payer: Self-pay

## 2020-04-29 VITALS — BP 108/72 | HR 86 | Ht 63.0 in | Wt 119.0 lb

## 2020-04-29 DIAGNOSIS — R5383 Other fatigue: Secondary | ICD-10-CM

## 2020-04-29 DIAGNOSIS — Z8616 Personal history of COVID-19: Secondary | ICD-10-CM | POA: Diagnosis not present

## 2020-04-29 DIAGNOSIS — I10 Essential (primary) hypertension: Secondary | ICD-10-CM

## 2020-04-29 DIAGNOSIS — I5032 Chronic diastolic (congestive) heart failure: Secondary | ICD-10-CM

## 2020-04-29 DIAGNOSIS — I272 Pulmonary hypertension, unspecified: Secondary | ICD-10-CM

## 2020-04-29 MED ORDER — AMLODIPINE BESYLATE 5 MG PO TABS: 2.5000 mg | ORAL_TABLET | Freq: Every day | ORAL | 3 refills | Status: DC

## 2020-04-29 NOTE — Progress Notes (Signed)
Cardiology Office Note:    Date:  04/29/2020   ID:  Tina Bailey, DOB 07-20-1945, MRN [id number]  PCP:  Roetta Sessions, NP  Cardiologist:  Buford Dresser, MD PhD  Referring MD: Roetta Sessions*   CC: follow up  History of Present Illness:    Tina Bailey is a 75 y.o. female with a hx of hepatocellular carcinoma s/p right hepatectomy 2016, chronic joint pain due to arthritis, history of tobacco abuse (~50 years), COPD who is seen in follow up today. She was initially seen as a new patient to me (last seen by Dr. Oval Linsey in 2016 for preop clearance) at the request of Roetta Sessions* for the evaluation and management of chronic diastolic heart failure on 04/13/18.  Cardiac history: She was admitted for LE edema in 02/2018. She had several fractures (femur, sternum) in the fall and noted several weeks after that she developed LE edema. Doppler negative for DVT. Echo done during that hospitalization was notable for grade 1 diastolic dysfunction, severe pulmonary hypertension, and right atrial mass. Declined further evaluation of right atrial mass and routine monitoring of TAA, as she states she would never want CT surgery.   Today: Close follow up with start of amlodipine. Has been more fatigued, especially by afternoon. Tried to switch medications to evening but still fatigued. Stopped medication, not sure if it helped her fatigue. Wants to lay down and sleep. No energy/motivation to be active.   Does note that she had Covid in the interim since our last visit, did not have severe symptoms or need to be hospitalized.   Had been eating salt with late night snacks, noted that her weight has gone from 114 to 119 lbs at her facility. Feels like it is all in her abdomen.   Mild sock lines, but no severe edema.   Denies chest pain, shortness of breath at rest or with normal exertion. No PND, orthopnea, worsening LE edema or unexpected weight gain. No  syncope or palpitations.  Past Medical History:  Diagnosis Date  . Arthritis    DDD. Right shoulder"is frozen"-limited ROM. osteoporosis.  . Blue toe syndrome ([place]) 11/27/2014   Dr. Claudia Pollock evaluating  . CHF (congestive heart failure) ([place])   . Complication of anesthesia   . COPD (chronic obstructive pulmonary disease) ([place])   . Fibromyalgia   . Fracture of rib of right side    hx "osteoporosis"- states her dog nudge her on the side, next day developed great pain and was told has a fracture rib.  . Liver cancer ([place]) 12/2014  . Macular degeneration    R eye  . Osteoporosis   . PONV (postoperative nausea and vomiting)    nausea,severe vomiting after 01-13-15 portal vein embolization  . Productive cough   . Retina disorder    L eye, vision distorted, edema  . Spine fracture due to birth trauma   . TMJ disease   . Wears glasses     Past Surgical History:  Procedure Laterality Date  . ANGIOPLASTY  2008   no stents required, no follow-up with cardiologist, no recurrent chest pain  . CATARACT EXTRACTION, BILATERAL Bilateral   . ESOPHAGOGASTRODUODENOSCOPY (EGD) WITH PROPOFOL N/A 06/25/2015   Procedure: ESOPHAGOGASTRODUODENOSCOPY (EGD) WITH PROPOFOL;  Surgeon: Milus Banister, MD;  Location: [facility];  Service: Endoscopy;  Laterality: N/A;  stent removal   . EYE SURGERY     cornea surgery  . INTRAMEDULLARY (IM) NAIL INTERTROCHANTERIC Right 12/04/2017   Procedure: INTRAMEDULLARY (  IM) NAIL INTERTROCHANTRIC;  Surgeon: Rod Can, MD;  Location: [facility];  Service: Orthopedics;  Laterality: Right;  . IR RADIOLOGIST EVAL & MGMT  03/29/2017  . KIDNEY STONE SURGERY    . LAPAROSCOPIC PARTIAL HEPATECTOMY N/A 03/04/2015   Procedure: DIAGNOSTIC LAPAROSCOPY, EXTENDED RIGHT HEPATECTOMY, WITH INTRAOPERATIVE ULTRASOUND;  Surgeon: Stark Klein, MD;  Location: [facility];  Service: General;  Laterality: N/A;  . PARTIAL HYSTERECTOMY    . portal vein embolization     01-13-15 -Dr. Cathlean Sauer.    Current Medications: Current Outpatient Medications on File Prior to Visit  Medication Sig  . acetaminophen (TYLENOL) 325 MG tablet Take 2 tablets (650 mg total) by mouth every 6 (six) hours as needed for mild pain (or Fever >/= 101).  . Aspirin-Acetaminophen-Caffeine (EXCEDRIN MIGRAINE PO) Take by mouth as needed.  . bisacodyl (DULCOLAX) 10 MG suppository Place 1 suppository (10 mg total) rectally daily as needed for moderate constipation.  . busPIRone (BUSPAR) 10 MG tablet Take 20 mg by mouth 3 (three) times daily.  . DULoxetine (CYMBALTA) 30 MG capsule Take 1 tablet by mouth daily.  . Glycopyrrolate-Formoterol (BEVESPI AEROSPHERE) 9-4.8 MCG/ACT AERO Inhale into the lungs 2 (two) times daily as needed.  Marland Kitchen HYDROcodone-acetaminophen (NORCO) 10-325 MG tablet Take 1 tablet by mouth in the morning, at noon, in the evening, and at bedtime.  Marland Kitchen LORazepam (ATIVAN) 0.5 MG tablet Take 0.5 mg by mouth as needed.  . melatonin 5 MG TABS melatonin 5 mg tablet  Take 1 tablet every day by oral route at bedtime.  . senna-docusate (SENOKOT-S) 8.6-50 MG tablet Take 1 tablet by mouth 2 (two) times daily as needed for mild constipation.   No current facility-administered medications on file prior to visit.     Allergies:   Zoloft [sertraline hcl], Amoxicillin-pot clavulanate, Clindamycin/lincomycin, Paxil [paroxetine hcl], Paroxetine hcl, and Sulfa antibiotics   Social History   Tobacco Use  . Smoking status: Former Smoker    Packs/day: 0.50    Years: 45.00    Pack years: 22.50    Types: Cigarettes    Quit date: 02/20/2018    Years since quitting: 2.1  . Smokeless tobacco: Never Used  Vaping Use  . Vaping Use: Never used  Substance Use Topics  . Alcohol use: No    Alcohol/week: 0.0 standard drinks  . Drug use: No    Family History: The patient's family history includes Atrial fibrillation in her mother; Breast cancer in her mother; Dementia in her father; Heart attack in her  brother; Heart disease in her brother and mother; Hypertension in her mother; Peptic Ulcer in her father; Stroke in her father.  ROS:   Please see the history of present illness.  Additional pertinent ROS otherwise unremarkable.  EKGs/Labs/Other Studies Reviewed:    The following studies were reviewed today: Echo 03/20/18 - Left ventricle: The cavity size was normal. Systolic function was   normal. The estimated ejection fraction was in the range of 55%   to 60%. Wall motion was normal; there were no regional wall   motion abnormalities. Doppler parameters are consistent with   abnormal left ventricular relaxation (grade 1 diastolic   dysfunction). - Right ventricle: The cavity size was mildly dilated. Wall   thickness was normal. Systolic function was mildly reduced. - Right atrium: There was a mass in the atrial cavity adjacent to   the intra-atrial septum. - Tricuspid valve: There was moderate regurgitation. - Pulmonary arteries: Systolic pressure was severely increased. PA  peak pressure: 60 mm Hg (S). - Pericardium, extracardiac: A small pericardial effusion was   identified. There is mitral inflow respiratory variability, but   no clear RA/RV collapse.  Impressions:  - Normal LV EF, grade 1 diastolic dysfunction.   There is a structure in the right atrial cavity adjacent to the   intra-atrial septum that cannot be well defined. Could be very   hypertrophied septum, though favor other etiology, cannot exclude   thrombus, myxoma, or other.   Severe pulmonary hypertension, with mild RV dysfunction and   dilation.   Small pericardial effusion, predominantly posterior. There is   mitral inflow variability, though this may be unrelated to   effusion. No RA/RV collapse seen.  Arterial duplex 02/20/18 Right: Total occlusion noted in the superficial femoral artery with reconstitution in the popliteal artery.  ABI 02/20/18 Right: Resting right ankle-brachial index indicates  moderate right lower extremity arterial disease. The right toe-brachial index is abnormal.  Left: Resting left ankle-brachial index indicates mild left lower extremity arterial disease. The left toe-brachial index is abnormal.  Nuclear stress 01/12/15  Nuclear stress EF: 67%.  There was no ST segment deviation noted during stress.  The study is normal.  This is a low risk study.  The left ventricular ejection fraction is hyperdynamic (>65%).   EKG:  EKG is personally reviewed.  The ekg ordered 02/28/20 demonstrates SR with PVCs. Recent Labs: 12/25/2019: ALT 11; BUN 15; Creatinine, Ser 0.70; Hemoglobin 14.6; Platelets 212; Potassium 4.2; Sodium 135  Recent Lipid Panel    Component Value Date/Time   CHOL 188 01/09/2017 1109   TRIG 71.0 01/09/2017 1109   HDL 91.40 01/09/2017 1109   CHOLHDL 2 01/09/2017 1109   VLDL 14.2 01/09/2017 1109   LDLCALC 82 01/09/2017 1109    Physical Exam:    VS:  BP 108/72 (BP Location: Left Arm, Patient Position: Sitting)   Pulse 86   Ht 5\' 3"  (1.6 m)   Wt 119 lb (54 kg)   SpO2 90%   BMI 21.08 kg/m     Wt Readings from Last 3 Encounters:  04/29/20 119 lb (54 kg)  02/28/20 110 lb 12.8 oz (50.3 kg)  12/25/19 114 lb 8 oz (51.9 kg)    GEN: frail elderly woman in no acute distress HEENT: Normal, moist mucous membranes NECK: No JVD CARDIAC: regular rhythm, normal S1 and S2, no rubs or gallops. No murmur. VASCULAR: Radial and DP pulses 2+ bilaterally. No carotid bruits RESPIRATORY:  Clear to auscultation though distant breath sounds. ABDOMEN: Soft, non-tender, non-distended MUSCULOSKELETAL:  Ambulates independently with rollator SKIN: Warm and dry, trivial bilateral LE edema NEUROLOGIC:  Alert and oriented x 3. No focal neuro deficits noted. PSYCHIATRIC:  Normal affect   ASSESSMENT:    1. Chronic diastolic heart failure ([place])   2. Essential hypertension   3. Pulmonary hypertension, unspecified ([place])   4. Fatigue, unspecified type   5.  Personal history of COVID-19    PLAN:    Chronic diastolic heart failure, severe pulmonary hypertension on echo,history of severebilateral LE edema:  -edema has been stable, notices more if she eats a lot of salt -continue elevation, compression as tolerated  -continue tobacco abstinence -has previously endorsed that she does not want invasive testing (such as right heart cath) or surgery  Labile blood pressure readings/hypertension -had intermittently very elevated BP. Now on amlodipine, with worsening fatigue but good control of BP -after shared decision making, will cut dose of amlodipine to 2.5 mg daily -will  follow up closely with virtual visit -asked facility to check BP weekly -if symptoms do not improve, we discussed changing to chlorthalidone  Fatigue/lethargy -reports as wanting to sleep/not interested in being active, which is not her baseline -did not change much with holding amlodipine, but will see if improves on lower dose -also had recent Covid infection, though this was mild  Thoracic aortic aneurysm:as previously noted, she would not want surgery, does not wish to follow for monitoring  Plan for follow up: 3-4 weeks, virtual  Buford Dresser, MD, PhD, [facility]   Medication Adjustments/Labs and Tests Ordered: Current medicines are reviewed at length with the patient today.  Concerns regarding medicines are outlined above.  No orders of the defined types were placed in this encounter.  Meds ordered this encounter  Medications  . amLODipine (NORVASC) 5 MG tablet    Sig: Take 0.5 tablets (2.5 mg total) by mouth daily.    Dispense:  45 tablet    Refill:  3    Patient Instructions  Medication Instructions:  Decrease Amlodipine to 2.5 mg daily  Please have facility check blood pressure once a week.   *If you need a refill on your cardiac medications before your next appointment, please call your pharmacy*   Lab  Work: None   Testing/Procedures: None  Follow-Up: At [facility], you and your health needs are our priority.  As part of our continuing mission to provide you with exceptional heart care, we have created designated Provider Care Teams.  These Care Teams include your primary Cardiologist (physician) and Advanced Practice Providers (APPs -  Physician Assistants and Nurse Practitioners) who all work together to provide you with the care you need, when you need it.  We recommend signing up for the patient portal called "MyChart".  Sign up information is provided on this After Visit Summary.  MyChart is used to connect with patients for Virtual Visits (Telemedicine).  Patients are able to view lab/test results, encounter notes, upcoming appointments, etc.  Non-urgent messages can be sent to your provider as well.   To learn more about what you can do with MyChart, go to [email or web address].    Your next appointment:   3-4 week(s)  The format for your next appointment:   Virtual Visit   Provider:   Buford Dresser, MD      Signed, Buford Dresser, MD PhD 04/29/2020 5:53 PM    [place]

## 2020-04-29 NOTE — Patient Instructions (Addendum)
Medication Instructions:  Decrease Amlodipine to 2.5 mg daily  Please have facility check blood pressure once a week.   *If you need a refill on your cardiac medications before your next appointment, please call your pharmacy*   Lab Work: None   Testing/Procedures: None  Follow-Up: At [facility], you and your health needs are our priority.  As part of our continuing mission to provide you with exceptional heart care, we have created designated Provider Care Teams.  These Care Teams include your primary Cardiologist (physician) and Advanced Practice Providers (APPs -  Physician Assistants and Nurse Practitioners) who all work together to provide you with the care you need, when you need it.  We recommend signing up for the patient portal called "MyChart".  Sign up information is provided on this After Visit Summary.  MyChart is used to connect with patients for Virtual Visits (Telemedicine).  Patients are able to view lab/test results, encounter notes, upcoming appointments, etc.  Non-urgent messages can be sent to your provider as well.   To learn more about what you can do with MyChart, go to [email or web address].    Your next appointment:   3-4 week(s)  The format for your next appointment:   Virtual Visit   Provider:   Buford Dresser, MD

## 2020-04-30 ENCOUNTER — Telehealth: Payer: Self-pay | Admitting: Cardiology

## 2020-04-30 DIAGNOSIS — R69 Illness, unspecified: Secondary | ICD-10-CM | POA: Diagnosis not present

## 2020-04-30 DIAGNOSIS — F064 Anxiety disorder due to known physiological condition: Secondary | ICD-10-CM | POA: Diagnosis not present

## 2020-04-30 DIAGNOSIS — F5109 Other insomnia not due to a substance or known physiological condition: Secondary | ICD-10-CM | POA: Diagnosis not present

## 2020-04-30 NOTE — Telephone Encounter (Signed)
Orders faxed

## 2020-04-30 NOTE — Telephone Encounter (Signed)
Tina Bailey from [employer] called in and stated they needs order to change her amlodipine to 2.5mg  and take her BP once a week.    Fax number [phone]

## 2020-05-01 ENCOUNTER — Ambulatory Visit: Payer: Medicare Other | Admitting: Cardiology

## 2020-05-17 DIAGNOSIS — R69 Illness, unspecified: Secondary | ICD-10-CM | POA: Diagnosis not present

## 2020-05-18 DIAGNOSIS — R69 Illness, unspecified: Secondary | ICD-10-CM | POA: Diagnosis not present

## 2020-05-18 DIAGNOSIS — F5109 Other insomnia not due to a substance or known physiological condition: Secondary | ICD-10-CM | POA: Diagnosis not present

## 2020-05-18 DIAGNOSIS — F064 Anxiety disorder due to known physiological condition: Secondary | ICD-10-CM | POA: Diagnosis not present

## 2020-05-18 DIAGNOSIS — I270 Primary pulmonary hypertension: Secondary | ICD-10-CM | POA: Diagnosis not present

## 2020-05-18 DIAGNOSIS — J449 Chronic obstructive pulmonary disease, unspecified: Secondary | ICD-10-CM | POA: Diagnosis not present

## 2020-05-26 ENCOUNTER — Telehealth: Payer: Medicare HMO | Admitting: Cardiology

## 2020-05-28 ENCOUNTER — Telehealth (INDEPENDENT_AMBULATORY_CARE_PROVIDER_SITE_OTHER): Payer: Medicare HMO | Admitting: Cardiology

## 2020-05-28 ENCOUNTER — Encounter: Payer: Self-pay | Admitting: Cardiology

## 2020-05-28 VITALS — BP 130/76 | HR 59 | Ht 63.0 in | Wt 120.0 lb

## 2020-05-28 DIAGNOSIS — I5032 Chronic diastolic (congestive) heart failure: Secondary | ICD-10-CM

## 2020-05-28 DIAGNOSIS — R5383 Other fatigue: Secondary | ICD-10-CM

## 2020-05-28 DIAGNOSIS — I10 Essential (primary) hypertension: Secondary | ICD-10-CM | POA: Diagnosis not present

## 2020-05-28 DIAGNOSIS — I272 Pulmonary hypertension, unspecified: Secondary | ICD-10-CM | POA: Diagnosis not present

## 2020-05-28 NOTE — Patient Instructions (Signed)
Medication Instructions:  Your Physician recommend you continue on your current medication as directed.    *If you need a refill on your cardiac medications before your next appointment, please call your pharmacy*   Lab Work: None   Testing/Procedures: None   Follow-Up: At [facility], you and your health needs are our priority.  As part of our continuing mission to provide you with exceptional heart care, we have created designated Provider Care Teams.  These Care Teams include your primary Cardiologist (physician) and Advanced Practice Providers (APPs -  Physician Assistants and Nurse Practitioners) who all work together to provide you with the care you need, when you need it.  We recommend signing up for the patient portal called "MyChart".  Sign up information is provided on this After Visit Summary.  MyChart is used to connect with patients for Virtual Visits (Telemedicine).  Patients are able to view lab/test results, encounter notes, upcoming appointments, etc.  Non-urgent messages can be sent to your provider as well.   To learn more about what you can do with MyChart, go to [email or web address].    Your next appointment:   3 month(s)  The format for your next appointment:   In Person  Provider:   Dr. Harrell Gave

## 2020-05-28 NOTE — Progress Notes (Signed)
Virtual Visit via Telephone Note   This visit type was conducted due to national recommendations for restrictions regarding the COVID-19 Pandemic (e.g. social distancing) in an effort to limit this patient's exposure and mitigate transmission in our community.  Due to her co-morbid illnesses, this patient is at least at moderate risk for complications without adequate follow up.  This format is felt to be most appropriate for this patient at this time.  The patient did not have access to video technology/had technical difficulties with video requiring transitioning to audio format only (telephone).  All issues noted in this document were discussed and addressed.  No physical exam could be performed with this format.  Please refer to the patient's chart for her  consent to telehealth for [facility].   The patient was identified using 2 identifiers.  Patient Location: Home Provider Location: Office/Clinic  Date:  05/28/2020   ID:  Tina Bailey, DOB Jul 10, 1945, MRN [id number]  PCP:  Roetta Sessions, NP  Cardiologist:  Buford Dresser, MD PhD  Referring MD: Roetta Sessions*   CC: follow up  History of Present Illness:    Tina Bailey is a 75 y.o. female with a hx of hepatocellular carcinoma s/p right hepatectomy 2016, chronic joint pain due to arthritis, history of tobacco abuse (~50 years), COPD who is seen in follow up today. She was initially seen as a new patient to me (last seen by Dr. Oval Linsey in 2016 for preop clearance) at the request of Roetta Sessions* for the evaluation and management of chronic diastolic heart failure on 04/13/18.  Cardiac history: She was admitted for LE edema in 02/2018. She had several fractures (femur, sternum) in the fall and noted several weeks after that she developed LE edema. Doppler negative for DVT. Echo done during that hospitalization was notable for grade 1 diastolic dysfunction, severe pulmonary  hypertension, and right atrial mass. Declined further evaluation of right atrial mass and routine monitoring of TAA, as she states she would never want CT surgery.   Today: Close follow up with drop in amlodipine dose. Feeling better, fatigue improved with dropping amlodipine dose. Checking BP once a week, has been stable.   Weight is 120 lbs, feels like there is no extra fluid. Has been eating well. Making good urine. Rare sock edema, improved from prior.   Denies chest pain, shortness of breath at rest or with normal exertion. No PND, orthopnea, severe LE edema or unexpected weight gain. No syncope or palpitations.  Past Medical History:  Diagnosis Date  . Arthritis    DDD. Right shoulder"is frozen"-limited ROM. osteoporosis.  . Blue toe syndrome ([place]) 11/27/2014   Dr. Claudia Pollock evaluating  . CHF (congestive heart failure) ([place])   . Complication of anesthesia   . COPD (chronic obstructive pulmonary disease) ([place])   . Fibromyalgia   . Fracture of rib of right side    hx "osteoporosis"- states her dog nudge her on the side, next day developed great pain and was told has a fracture rib.  . Liver cancer ([place]) 12/2014  . Macular degeneration    R eye  . Osteoporosis   . PONV (postoperative nausea and vomiting)    nausea,severe vomiting after 01-13-15 portal vein embolization  . Productive cough   . Retina disorder    L eye, vision distorted, edema  . Spine fracture due to birth trauma   . TMJ disease   . Wears glasses     Past Surgical History:  Procedure Laterality Date  . ANGIOPLASTY  2008   no stents required, no follow-up with cardiologist, no recurrent chest pain  . CATARACT EXTRACTION, BILATERAL Bilateral   . ESOPHAGOGASTRODUODENOSCOPY (EGD) WITH PROPOFOL N/A 06/25/2015   Procedure: ESOPHAGOGASTRODUODENOSCOPY (EGD) WITH PROPOFOL;  Surgeon: Milus Banister, MD;  Location: [facility];  Service: Endoscopy;  Laterality: N/A;  stent removal   . EYE SURGERY     cornea surgery   . INTRAMEDULLARY (IM) NAIL INTERTROCHANTERIC Right 12/04/2017   Procedure: INTRAMEDULLARY (IM) NAIL INTERTROCHANTRIC;  Surgeon: Rod Can, MD;  Location: [facility];  Service: Orthopedics;  Laterality: Right;  . IR RADIOLOGIST EVAL & MGMT  03/29/2017  . KIDNEY STONE SURGERY    . LAPAROSCOPIC PARTIAL HEPATECTOMY N/A 03/04/2015   Procedure: DIAGNOSTIC LAPAROSCOPY, EXTENDED RIGHT HEPATECTOMY, WITH INTRAOPERATIVE ULTRASOUND;  Surgeon: Stark Klein, MD;  Location: [facility];  Service: General;  Laterality: N/A;  . PARTIAL HYSTERECTOMY    . portal vein embolization     01-13-15 -Dr. Cathlean Sauer.    Current Medications: Current Outpatient Medications on File Prior to Visit  Medication Sig  . acetaminophen (TYLENOL) 325 MG tablet Take 2 tablets (650 mg total) by mouth every 6 (six) hours as needed for mild pain (or Fever >/= 101).  Marland Kitchen amLODipine (NORVASC) 5 MG tablet Take 0.5 tablets (2.5 mg total) by mouth daily.  . Aspirin-Acetaminophen-Caffeine (EXCEDRIN MIGRAINE PO) Take by mouth as needed.  . bisacodyl (DULCOLAX) 10 MG suppository Place 1 suppository (10 mg total) rectally daily as needed for moderate constipation.  . busPIRone (BUSPAR) 10 MG tablet Take 20 mg by mouth 3 (three) times daily.  . DULoxetine (CYMBALTA) 30 MG capsule Take 1 tablet by mouth daily.  . Glycopyrrolate-Formoterol (BEVESPI AEROSPHERE) 9-4.8 MCG/ACT AERO Inhale into the lungs 2 (two) times daily as needed.  Marland Kitchen HYDROcodone-acetaminophen (NORCO) 10-325 MG tablet Take 1 tablet by mouth in the morning, at noon, in the evening, and at bedtime.  Marland Kitchen LORazepam (ATIVAN) 0.5 MG tablet Take 0.5 mg by mouth as needed.  . melatonin 5 MG TABS melatonin 5 mg tablet  Take 1 tablet every day by oral route at bedtime.  . senna-docusate (SENOKOT-S) 8.6-50 MG tablet Take 1 tablet by mouth 2 (two) times daily as needed for mild constipation.   No current facility-administered medications on file prior to visit.     Allergies:   Zoloft  [sertraline hcl], Amoxicillin-pot clavulanate, Clindamycin/lincomycin, Paxil [paroxetine hcl], Paroxetine hcl, and Sulfa antibiotics   Social History   Tobacco Use  . Smoking status: Former Smoker    Packs/day: 0.50    Years: 45.00    Pack years: 22.50    Types: Cigarettes    Quit date: 02/20/2018    Years since quitting: 2.2  . Smokeless tobacco: Never Used  Vaping Use  . Vaping Use: Never used  Substance Use Topics  . Alcohol use: No    Alcohol/week: 0.0 standard drinks  . Drug use: No    Family History: The patient's family history includes Atrial fibrillation in her mother; Breast cancer in her mother; Dementia in her father; Heart attack in her brother; Heart disease in her brother and mother; Hypertension in her mother; Peptic Ulcer in her father; Stroke in her father.  ROS:   Please see the history of present illness.  Additional pertinent ROS otherwise unremarkable.  EKGs/Labs/Other Studies Reviewed:    The following studies were reviewed today: Echo 03/20/18 - Left ventricle: The cavity size was normal. Systolic function was  normal. The estimated ejection fraction was in the range of 55%   to 60%. Wall motion was normal; there were no regional wall   motion abnormalities. Doppler parameters are consistent with   abnormal left ventricular relaxation (grade 1 diastolic   dysfunction). - Right ventricle: The cavity size was mildly dilated. Wall   thickness was normal. Systolic function was mildly reduced. - Right atrium: There was a mass in the atrial cavity adjacent to   the intra-atrial septum. - Tricuspid valve: There was moderate regurgitation. - Pulmonary arteries: Systolic pressure was severely increased. PA   peak pressure: 60 mm Hg (S). - Pericardium, extracardiac: A small pericardial effusion was   identified. There is mitral inflow respiratory variability, but   no clear RA/RV collapse.  Impressions:  - Normal LV EF, grade 1 diastolic  dysfunction.   There is a structure in the right atrial cavity adjacent to the   intra-atrial septum that cannot be well defined. Could be very   hypertrophied septum, though favor other etiology, cannot exclude   thrombus, myxoma, or other.   Severe pulmonary hypertension, with mild RV dysfunction and   dilation.   Small pericardial effusion, predominantly posterior. There is   mitral inflow variability, though this may be unrelated to   effusion. No RA/RV collapse seen.  Arterial duplex 02/20/18 Right: Total occlusion noted in the superficial femoral artery with reconstitution in the popliteal artery.  ABI 02/20/18 Right: Resting right ankle-brachial index indicates moderate right lower extremity arterial disease. The right toe-brachial index is abnormal.  Left: Resting left ankle-brachial index indicates mild left lower extremity arterial disease. The left toe-brachial index is abnormal.  Nuclear stress 01/12/15  Nuclear stress EF: 67%.  There was no ST segment deviation noted during stress.  The study is normal.  This is a low risk study.  The left ventricular ejection fraction is hyperdynamic (>65%).   EKG:  EKG is personally reviewed.  The ekg ordered 02/28/20 demonstrates SR with PVCs. Recent Labs: 12/25/2019: ALT 11; BUN 15; Creatinine, Ser 0.70; Hemoglobin 14.6; Platelets 212; Potassium 4.2; Sodium 135  Recent Lipid Panel    Component Value Date/Time   CHOL 188 01/09/2017 1109   TRIG 71.0 01/09/2017 1109   HDL 91.40 01/09/2017 1109   CHOLHDL 2 01/09/2017 1109   VLDL 14.2 01/09/2017 1109   LDLCALC 82 01/09/2017 1109    Physical Exam:    VS:  BP 130/76   Pulse (!) 59   Ht 5\' 3"  (1.6 m)   Wt 120 lb (54.4 kg)   BMI 21.26 kg/m     Wt Readings from Last 3 Encounters:  05/28/20 120 lb (54.4 kg)  04/29/20 119 lb (54 kg)  02/28/20 110 lb 12.8 oz (50.3 kg)    Speaking comfortably on the phone, no audible wheezing In no acute distress Alert and  oriented Normal affect Normal speech  ASSESSMENT:    1. Essential hypertension   2. Chronic diastolic heart failure (HCC)   3. Pulmonary hypertension, unspecified ([place])   4. Fatigue, unspecified type    PLAN:    Chronic diastolic heart failure, severe pulmonary hypertension on echo,history of severebilateral LE edema: improved -we have discussed how salt plays a role -continue elevation, compression as tolerated  -continue tobacco abstinence -has previously endorsed that she does not want invasive testing (such as right heart cath) or surgery  Labile blood pressure readings/hypertension -had intermittently very elevated BP.  -well controlled to borderline low on amlodipine, with fatigue. Dropped amlodipine  dose to 2.5 mg and tolerating well  Fatigue/lethargy -reports as wanting to sleep/not interested in being active, which is not her baseline -still present but improved on lower dose of amlodipine -also had recent Covid infection, though this was mild  Thoracic aortic aneurysm:as previously noted, she would not want surgery, does not wish to follow for monitoring  Plan for follow up: 3 mos, in person  Today, I have spent 12 minutes with the patient with telehealth technology discussing the above problems.  Additional time spent in chart review, documentation, and communication.  Buford Dresser, MD, PhD, [facility]   Medication Adjustments/Labs and Tests Ordered: Current medicines are reviewed at length with the patient today.  Concerns regarding medicines are outlined above.  No orders of the defined types were placed in this encounter.  No orders of the defined types were placed in this encounter.   Patient Instructions  Medication Instructions:  Your Physician recommend you continue on your current medication as directed.    *If you need a refill on your cardiac medications before your next appointment, please call your  pharmacy*   Lab Work: None   Testing/Procedures: None   Follow-Up: At [facility], you and your health needs are our priority.  As part of our continuing mission to provide you with exceptional heart care, we have created designated Provider Care Teams.  These Care Teams include your primary Cardiologist (physician) and Advanced Practice Providers (APPs -  Physician Assistants and Nurse Practitioners) who all work together to provide you with the care you need, when you need it.  We recommend signing up for the patient portal called "MyChart".  Sign up information is provided on this After Visit Summary.  MyChart is used to connect with patients for Virtual Visits (Telemedicine).  Patients are able to view lab/test results, encounter notes, upcoming appointments, etc.  Non-urgent messages can be sent to your provider as well.   To learn more about what you can do with MyChart, go to [email or web address].    Your next appointment:   3 month(s)  The format for your next appointment:   In Person  Provider:   Dr. Harrell Gave       Signed, Buford Dresser, MD PhD 05/28/2020     [place]

## 2020-05-29 DIAGNOSIS — F064 Anxiety disorder due to known physiological condition: Secondary | ICD-10-CM | POA: Diagnosis not present

## 2020-05-29 DIAGNOSIS — R69 Illness, unspecified: Secondary | ICD-10-CM | POA: Diagnosis not present

## 2020-05-29 DIAGNOSIS — F5109 Other insomnia not due to a substance or known physiological condition: Secondary | ICD-10-CM | POA: Diagnosis not present

## 2020-06-06 DIAGNOSIS — R69 Illness, unspecified: Secondary | ICD-10-CM | POA: Diagnosis not present

## 2020-06-18 NOTE — Progress Notes (Incomplete)
[place]   [phone] [phone]   Clinic Follow up Note   Patient Care Team: Roetta Sessions, NP as PCP - General (Nurse Practitioner) Buford Dresser, MD as PCP - Cardiology (Cardiology) Elam Dutch, MD as Consulting Physician (Vascular Surgery) Stark Klein, MD as Consulting Physician (General Surgery) Truitt Merle, MD as Consulting Physician (Hematology) Suella Broad, MD as Consulting Physician (Physical Medicine and Rehabilitation) Valinda Party, MD as Consulting Physician (Rheumatology) Tanda Rockers, MD as Consulting Physician (Pulmonary Disease) Sharia Reeve, MD as Referring Physician (Psychiatry)  Date of Service:  06/18/2020  CHIEF COMPLAINT: f/u of Tina Bailey  SUMMARY OF ONCOLOGIC HISTORY: Oncology History Overview Note  Hepatocellular carcinoma [facility])   Staging form: Liver (Excluding Intrahepatic Bile Ducts), AJCC 7th Edition     Clinical stage from 12/30/2014: Stage Unknown (T1, NX, M0) - Unsigned     Hepatocellular carcinoma ([place])  12/02/2014 Imaging   Liver MRI showed a large lesion in segment 5 and 40, highly suspicious for malignancy. And a 1.2 cm indeterminate pancreatic body lesion. Mildly enlarged portocaval lymph node. Cholelithiasis.   12/19/2014 Initial Diagnosis   Hepatocellular carcinoma ([employer])   12/19/2014 Initial Biopsy   Liver mass biopsy showed parathyroid carcinoma, positive for hep par 1, cytokeratin 8/18, cytokeratin 7 and MOC-31.   12/25/2014 Imaging   CT chest showed a 3 mm groundglass probably nodule in the right upper lobe, mild right pericardial phrenic lymphadenopathy, no definitive evidence of metastasis.   01/13/2015 Procedure   Liquid embolization of the right portal venous system by Interventional Radiologist Dr. Laurence Ferrari   03/04/2015 Surgery   Liver right lobe hepatectomy, and the portal lymph nodes biopsy   03/04/2015 Pathology Results   Hepatocellular carcinoma,  poorly differentiated, spanning 8.5 cm, surgical margins were negative, lymphovascular invasion is identified, for portal lymph nodes were negative.    02/29/2016 Imaging   CT chest w contrast 02/29/2016 IMPRESSION: 1. Status post partial right hepatectomy, without recurrent or metastatic disease. 2. Removal of common duct stent since 06/10/2015. Upper normal to mildly dilated common duct after cholecystectomy. Recommend attention on follow-up. 3. Similar pancreatic head lesion, as detailed on prior MRI. 4. Pelvic floor laxity with cystocele. 5.  Aortic atherosclerosis.   10/14/2016 Imaging   MRI Abdomen W WO Contrast 10/14/16 IMPRESSION: 1. Stable appearance of the liver status post right hepatectomy. No evidence of recurrent tumor or metastatic disease. 2. Suggested slight interval enlargement of known cystic pancreatic mass, likely communicating with the main pancreatic duct and probably reflecting an intraductal papillary mucinous neoplasm. This demonstrates no aggressive characteristics, and continued surveillance suggested.    10/20/2017 Imaging   10/20/2017 MRI Abdomen IMPRESSION: 1. Stable appearance of the liver status post right hepatectomy. No evidence of recurrent tumor or metastatic disease. 2. Unchanged appearance of cystic pancreatic lesion which appears to communicate with the main pancreatic duct without main duct dilatation. Findings are compatible with intraductal papillary mucinous neoplasm. Continued interval follow-up is advised. The next follow-up examination should be obtained in 12 months. This recommendation follows ACR consensus guidelines: Management of Incidental Pancreatic Cysts: A White Paper of the ACR Incidental Findings Committee. J Am Coll Radiol [phone].   12/11/2018 Imaging   CT AP w contrast Hepatobiliary: Stable appearance of postsurgical changes of partial liver resection. cholecystectomy. There is dilatation of the common bile duct  similar to prior CT.  IMPRESSION: 1. No acute intra-abdominal or pelvic pathology. 2. Sigmoid diverticulosis. No bowel obstruction or active inflammation. Normal appendix.  06/17/2019 Imaging   IMPRESSION: 1. Mild growth of the suspected intraductal papillary mucinous neoplasm of the pancreatic body, now measuring 1.6 by 1.6 by 1.7 cm, previously 1.5 by 1.3 by 1.5 cm by my measurements. However, this does not qualify as formal "interval growth " based on established criteria. Possibilities for follow up at this point include annual surveillance pancreatic protocol CT or MRI, or endoscopic ultrasound/fine-needle aspiration. 2. Bilateral sacral insufficiency fractures. 3. Prior right hepatectomy. No findings of recurrent hepatocellular carcinoma. 4. Sigmoid colon diverticulosis. 5. Multilevel compression fractures of the lower thoracic and lumbar spine with vertebral augmentation at some levels. 6.  Aortic Atherosclerosis (ICD10-I70.0).      CURRENT THERAPY:  Surveillance  INTERVAL HISTORY: *** Tina Bailey is here for a follow up of [place]. She was last seen by me in 12/2019***  REVIEW OF SYSTEMS:  *** Constitutional: Denies fevers, chills or abnormal weight loss Eyes: Denies blurriness of vision Ears, nose, mouth, throat, and face: Denies mucositis or sore throat Respiratory: Denies cough, dyspnea or wheezes Cardiovascular: Denies palpitation, chest discomfort or lower extremity swelling Gastrointestinal:  Denies nausea, heartburn or change in bowel habits Skin: Denies abnormal skin rashes Lymphatics: Denies new lymphadenopathy or easy bruising Neurological:Denies numbness, tingling or new weaknesses Behavioral/Psych: Mood is stable, no new changes  All other systems were reviewed with the patient and are negative.  MEDICAL HISTORY:  Past Medical History:  Diagnosis Date  . Arthritis    DDD. Right shoulder"is frozen"-limited ROM. osteoporosis.  . Blue toe  syndrome ([place]) 11/27/2014   Dr. Claudia Pollock evaluating  . CHF (congestive heart failure) ([place])   . Complication of anesthesia   . COPD (chronic obstructive pulmonary disease) ([place])   . Fibromyalgia   . Fracture of rib of right side    hx "osteoporosis"- states her dog nudge her on the side, next day developed great pain and was told has a fracture rib.  . Liver cancer ([place]) 12/2014  . Macular degeneration    R eye  . Osteoporosis   . PONV (postoperative nausea and vomiting)    nausea,severe vomiting after 01-13-15 portal vein embolization  . Productive cough   . Retina disorder    L eye, vision distorted, edema  . Spine fracture due to birth trauma   . TMJ disease   . Wears glasses     SURGICAL HISTORY: Past Surgical History:  Procedure Laterality Date  . ANGIOPLASTY  2008   no stents required, no follow-up with cardiologist, no recurrent chest pain  . CATARACT EXTRACTION, BILATERAL Bilateral   . ESOPHAGOGASTRODUODENOSCOPY (EGD) WITH PROPOFOL N/A 06/25/2015   Procedure: ESOPHAGOGASTRODUODENOSCOPY (EGD) WITH PROPOFOL;  Surgeon: Milus Banister, MD;  Location: [facility];  Service: Endoscopy;  Laterality: N/A;  stent removal   . EYE SURGERY     cornea surgery  . INTRAMEDULLARY (IM) NAIL INTERTROCHANTERIC Right 12/04/2017   Procedure: INTRAMEDULLARY (IM) NAIL INTERTROCHANTRIC;  Surgeon: Rod Can, MD;  Location: [facility] ORS;  Service: Orthopedics;  Laterality: Right;  . IR RADIOLOGIST EVAL & MGMT  03/29/2017  . KIDNEY STONE SURGERY    . LAPAROSCOPIC PARTIAL HEPATECTOMY N/A 03/04/2015   Procedure: DIAGNOSTIC LAPAROSCOPY, EXTENDED RIGHT HEPATECTOMY, WITH INTRAOPERATIVE ULTRASOUND;  Surgeon: Stark Klein, MD;  Location: [facility]S;  Service: General;  Laterality: N/A;  . PARTIAL HYSTERECTOMY    . portal vein embolization     01-13-15 -Dr. Cathlean Sauer.    I have reviewed the social history and family history with the patient and they  are unchanged from previous note.  ALLERGIES:   is allergic to zoloft [sertraline hcl], amoxicillin-pot clavulanate, clindamycin/lincomycin, paxil [paroxetine hcl], paroxetine hcl, and sulfa antibiotics.  MEDICATIONS:  Current Outpatient Medications  Medication Sig Dispense Refill  . acetaminophen (TYLENOL) 325 MG tablet Take 2 tablets (650 mg total) by mouth every 6 (six) hours as needed for mild pain (or Fever >/= 101).    Marland Kitchen amLODipine (NORVASC) 5 MG tablet Take 0.5 tablets (2.5 mg total) by mouth daily. 45 tablet 3  . Aspirin-Acetaminophen-Caffeine (EXCEDRIN MIGRAINE PO) Take by mouth as needed.    . bisacodyl (DULCOLAX) 10 MG suppository Place 1 suppository (10 mg total) rectally daily as needed for moderate constipation. 12 suppository 0  . busPIRone (BUSPAR) 10 MG tablet Take 20 mg by mouth 3 (three) times daily.    . DULoxetine (CYMBALTA) 30 MG capsule Take 1 tablet by mouth daily.    . Glycopyrrolate-Formoterol (BEVESPI AEROSPHERE) 9-4.8 MCG/ACT AERO Inhale into the lungs 2 (two) times daily as needed.    Marland Kitchen HYDROcodone-acetaminophen (NORCO) 10-325 MG tablet Take 1 tablet by mouth in the morning, at noon, in the evening, and at bedtime.    Marland Kitchen LORazepam (ATIVAN) 0.5 MG tablet Take 0.5 mg by mouth as needed.    . melatonin 5 MG TABS melatonin 5 mg tablet  Take 1 tablet every day by oral route at bedtime.    . senna-docusate (SENOKOT-S) 8.6-50 MG tablet Take 1 tablet by mouth 2 (two) times daily as needed for mild constipation. 60 tablet 12   No current facility-administered medications for this visit.    PHYSICAL EXAMINATION: ECOG PERFORMANCE STATUS: {CHL ONC ECOG [phone]}  There were no vitals filed for this visit. There were no vitals filed for this visit. *** GENERAL:alert, no distress and comfortable SKIN: skin color, texture, turgor are normal, no rashes or significant lesions EYES: normal, Conjunctiva are pink and non-injected, sclera clear {OROPHARYNX:no exudate, no erythema and lips, buccal mucosa, and tongue  normal}  NECK: supple, thyroid normal size, non-tender, without nodularity LYMPH:  no palpable lymphadenopathy in the cervical, axillary {or inguinal} LUNGS: clear to auscultation and percussion with normal breathing effort HEART: regular rate & rhythm and no murmurs and no lower extremity edema ABDOMEN:abdomen soft, non-tender and normal bowel sounds Musculoskeletal:no cyanosis of digits and no clubbing  NEURO: alert & oriented x 3 with fluent speech, no focal motor/sensory deficits  LABORATORY DATA:  I have reviewed the data as listed CBC Latest Ref Rng & Units 12/25/2019 02/19/2019 12/18/2018  WBC 4.0 - 10.5 K/uL 5.9 8.3 7.0  Hemoglobin 12.0 - 15.0 g/dL 14.6 14.7 14.1  Hematocrit 36.0 - 46.0 % 46.3(H) 46.5(H) 43.1  Platelets 150 - 400 K/uL 212 286 248     CMP Latest Ref Rng & Units 12/25/2019 06/17/2019 02/19/2019  Glucose 70 - 99 mg/dL 99 - 133(H)  BUN 8 - 23 mg/dL 15 - 10  Creatinine 0.44 - 1.00 mg/dL 0.70 0.60 0.51  Sodium 135 - 145 mmol/L 135 - 132(L)  Potassium 3.5 - 5.1 mmol/L 4.2 - 4.7  Chloride 98 - 111 mmol/L 94(L) - 93(L)  CO2 22 - 32 mmol/L 37(H) - 26  Calcium 8.9 - 10.3 mg/dL 9.2 - 9.0  Total Protein 6.5 - 8.1 g/dL 7.1 - 7.0  Total Bilirubin 0.3 - 1.2 mg/dL 0.6 - 0.9  Alkaline Phos 38 - 126 U/L 75 - 121  AST 15 - 41 U/L 16 - 38  ALT 0 - 44 U/L 11 - 36  RADIOGRAPHIC STUDIES: I have personally reviewed the radiological images as listed and agreed with the findings in the report. No results found.   ASSESSMENT & PLAN:  QUINITA KOSTELECKY is a 75 y.o. female with   1. Hepatocellular carcinoma, pT3bN0M0, stage IIIB, poorly differentiated -Diagnosed in 11/2014. Treated with embolization and right hepatectomy. Currently on surveillance. -serial imaging showedno evidence of recurrence -From a cancer standpoint she is clinically doing well. Labs reviewed, CBC and CMP WNL. AFP still pending. Physical exam shows right abdominal tenderness which is chronic, no  hepatomegaly.  -She is 5 years from her diagnosis.NED, risk of recurrence is low now. Continue surveillance. Will repeat Abdominal US in 2 weeks and before next visit. Will repeat MRI in 2023. -F/u in 6 month   2. Pancreatic lesion, benign -Relatively stable over many years, overall likley benign based on Dr Eugenia Pancoast evaluation.   3. H/o smoking (s/p cessation), COPD -Continue to f/u with PCPand Dr. Melvyn Novas  4. T11 compression fractureand multiple fractures and body pain  -She has had multiple fractures in her spine, sternum, and the right hip -She is S/p Kyphoplasty which improved her pain. Pain managed on low dose Dilaudid now.  -f/u ortho,Dr. Idamae Schuller  5. Osteoporosis -Stopped FORTEO in 2018 because she didn't want injections -Last DEXA was in 2017. Severe osteopenia noted on thoracic and lumbar CTs on 12/11/18.  -I discussed given her h/o multiple fractures I discussed oral or injection bisphosphonate to strengthen her bone. I gave her print out information of Oral Fosamax. She will think about it.  -I recommend she increase oral calcium and Vit D daily again. ***  6. Intermittent Upper Abdominal Pain  -She notes she has ongoing upper abdominal pain and not sure if this is related to her liver surgery.  -She has never had colonoscopy and does not want one. She has not had colon cancer screening before. I recommend Cologuard stool test with PCP, she is agreeable.  ***   Plan ***  -Copy note to her PCP Dr Domingo Cocking about Cologuard testing for colon cancer screening  -US Abdomen in 2 weeks for [facility] screening  -F/u in 6 months with lab and US Abdomen a few days before   No problem-specific Assessment & Plan notes found for this encounter.   No orders of the defined types were placed in this encounter.  All questions were answered. The patient knows to call the clinic with any problems, questions or concerns. No barriers to learning was detected. The total time spent in the  appointment was {CHL ONC TIME VISIT - [id number]}.     Aurea Graff 06/18/2020   I, Wilburn Mylar, am acting as scribe for Truitt Merle, MD.   {Add scribe attestation statement}

## 2020-06-22 ENCOUNTER — Ambulatory Visit (HOSPITAL_COMMUNITY)
Admission: RE | Admit: 2020-06-22 | Discharge: 2020-06-22 | Disposition: A | Discharge status: Home / Self Care | Payer: Medicare HMO | Source: Ambulatory Visit | Attending: Hematology | Admitting: Hematology

## 2020-06-22 ENCOUNTER — Other Ambulatory Visit: Payer: Self-pay

## 2020-06-22 DIAGNOSIS — Z8505 Personal history of malignant neoplasm of liver: Secondary | ICD-10-CM | POA: Diagnosis not present

## 2020-06-22 DIAGNOSIS — C220 Liver cell carcinoma: Secondary | ICD-10-CM | POA: Diagnosis not present

## 2020-06-24 NOTE — Progress Notes (Signed)
[place]   [phone] Fax:([phone]) [phone]   Clinic Follow up Note   Patient Care Team: Roetta Sessions, NP as PCP - General (Nurse Practitioner) Buford Dresser, MD as PCP - Cardiology (Cardiology) Elam Dutch, MD as Consulting Physician (Vascular Surgery) Stark Klein, MD as Consulting Physician (General Surgery) Truitt Merle, MD as Consulting Physician (Hematology) Suella Broad, MD as Consulting Physician (Physical Medicine and Rehabilitation) Valinda Party, MD as Consulting Physician (Rheumatology) Tanda Rockers, MD as Consulting Physician (Pulmonary Disease) Sharia Reeve, MD as Referring Physician (Psychiatry) 06/25/2020  CHIEF COMPLAINT: Follow up [name]  SUMMARY OF ONCOLOGIC HISTORY: Oncology History Overview Note  Hepatocellular carcinoma ([place])   Staging form: Liver (Excluding Intrahepatic Bile Ducts), AJCC 7th Edition     Clinical stage from 12/30/2014: Stage Unknown (T1, NX, M0) - Unsigned     Hepatocellular carcinoma ([place])  12/02/2014 Imaging   Liver MRI showed a large lesion in segment 5 and 40, highly suspicious for malignancy. And a 1.2 cm indeterminate pancreatic body lesion. Mildly enlarged portocaval lymph node. Cholelithiasis.   12/19/2014 Initial Diagnosis   Hepatocellular carcinoma ([place])   12/19/2014 Initial Biopsy   Liver mass biopsy showed parathyroid carcinoma, positive for hep par 1, cytokeratin 8/18, cytokeratin 7 and MOC-31.   12/25/2014 Imaging   CT chest showed a 3 mm groundglass probably nodule in the right upper lobe, mild right pericardial phrenic lymphadenopathy, no definitive evidence of metastasis.   01/13/2015 Procedure   Liquid embolization of the right portal venous system by Interventional Radiologist Dr. Laurence Ferrari   03/04/2015 Surgery   Liver right lobe hepatectomy, and the portal lymph nodes biopsy   03/04/2015 Pathology Results   Hepatocellular carcinoma, poorly  differentiated, spanning 8.5 cm, surgical margins were negative, lymphovascular invasion is identified, for portal lymph nodes were negative.    02/29/2016 Imaging   CT chest w contrast 02/29/2016 IMPRESSION: 1. Status post partial right hepatectomy, without recurrent or metastatic disease. 2. Removal of common duct stent since 06/10/2015. Upper normal to mildly dilated common duct after cholecystectomy. Recommend attention on follow-up. 3. Similar pancreatic head lesion, as detailed on prior MRI. 4. Pelvic floor laxity with cystocele. 5.  Aortic atherosclerosis.   10/14/2016 Imaging   MRI Abdomen W WO Contrast 10/14/16 IMPRESSION: 1. Stable appearance of the liver status post right hepatectomy. No evidence of recurrent tumor or metastatic disease. 2. Suggested slight interval enlargement of known cystic pancreatic mass, likely communicating with the main pancreatic duct and probably reflecting an intraductal papillary mucinous neoplasm. This demonstrates no aggressive characteristics, and continued surveillance suggested.    10/20/2017 Imaging   10/20/2017 MRI Abdomen IMPRESSION: 1. Stable appearance of the liver status post right hepatectomy. No evidence of recurrent tumor or metastatic disease. 2. Unchanged appearance of cystic pancreatic lesion which appears to communicate with the main pancreatic duct without main duct dilatation. Findings are compatible with intraductal papillary mucinous neoplasm. Continued interval follow-up is advised. The next follow-up examination should be obtained in 12 months. This recommendation follows ACR consensus guidelines: Management of Incidental Pancreatic Cysts: A White Paper of the ACR Incidental Findings Committee. J Am Coll Radiol [phone].   12/11/2018 Imaging   CT AP w contrast Hepatobiliary: Stable appearance of postsurgical changes of partial liver resection. cholecystectomy. There is dilatation of the common bile duct  similar to prior CT.  IMPRESSION: 1. No acute intra-abdominal or pelvic pathology. 2. Sigmoid diverticulosis. No bowel obstruction or active inflammation. Normal appendix.  06/17/2019 Imaging   IMPRESSION: 1. Mild growth of the suspected intraductal papillary mucinous neoplasm of the pancreatic body, now measuring 1.6 by 1.6 by 1.7 cm, previously 1.5 by 1.3 by 1.5 cm by my measurements. However, this does not qualify as formal "interval growth " based on established criteria. Possibilities for follow up at this point include annual surveillance pancreatic protocol CT or MRI, or endoscopic ultrasound/fine-needle aspiration. 2. Bilateral sacral insufficiency fractures. 3. Prior right hepatectomy. No findings of recurrent hepatocellular carcinoma. 4. Sigmoid colon diverticulosis. 5. Multilevel compression fractures of the lower thoracic and lumbar spine with vertebral augmentation at some levels. 6.  Aortic Atherosclerosis (ICD10-I70.0).   01/09/2020 Imaging   Abdominal ultrasound impression 1. Cirrhosis 2. Cholecystectomy 3. Patent main portal vein with hepatopetal flow 4. No ascites   06/22/2020 Imaging   Abdominal ultrasound IMPRESSION: 1. No hepatic mass. 2. Limited visualization of the common bile duct.     CURRENT THERAPY: Surveillance   INTERVAL HISTORY: Tina Bailey returns for follow up as scheduled. She was last seen by Dr. Burr Medico 12/2019. ABD [place] on 06/22/20 showed no liver mass.  She is doing well, no major changes in her health overall.  Her activity level is low, she does not feel motivated to do things and lacks interest.  She wonders if some of her fatigue is emotional related.  She has exertional dyspnea is slightly worse than baseline, she attributes to humidity and having to wear a mask.  Denies new cough, chest pain, fever or chills.  She quit smoking 2 years ago.  She is not on oxygen.  O2 sat runs low 90s at home occasionally high 80s.  Her scattered  abdominal soreness and chronic back pains are stable at baseline, denies new/worsening pain.  Denies bloating, swelling, jaundice, bleeding, nausea/vomiting, or other new concerns.   MEDICAL HISTORY:  Past Medical History:  Diagnosis Date  . Arthritis    DDD. Right shoulder"is frozen"-limited ROM. osteoporosis.  . Blue toe syndrome ([place]) 11/27/2014   Dr. Claudia Pollock evaluating  . CHF (congestive heart failure) ([place])   . Complication of anesthesia   . COPD (chronic obstructive pulmonary disease) ([place])   . Fibromyalgia   . Fracture of rib of right side    hx "osteoporosis"- states her dog nudge her on the side, next day developed great pain and was told has a fracture rib.  . Liver cancer ([place]) 12/2014  . Macular degeneration    R eye  . Osteoporosis   . PONV (postoperative nausea and vomiting)    nausea,severe vomiting after 01-13-15 portal vein embolization  . Productive cough   . Retina disorder    L eye, vision distorted, edema  . Spine fracture due to birth trauma   . TMJ disease   . Wears glasses     SURGICAL HISTORY: Past Surgical History:  Procedure Laterality Date  . ANGIOPLASTY  2008   no stents required, no follow-up with cardiologist, no recurrent chest pain  . CATARACT EXTRACTION, BILATERAL Bilateral   . ESOPHAGOGASTRODUODENOSCOPY (EGD) WITH PROPOFOL N/A 06/25/2015   Procedure: ESOPHAGOGASTRODUODENOSCOPY (EGD) WITH PROPOFOL;  Surgeon: Milus Banister, MD;  Location: [facility];  Service: Endoscopy;  Laterality: N/A;  stent removal   . EYE SURGERY     cornea surgery  . INTRAMEDULLARY (IM) NAIL INTERTROCHANTERIC Right 12/04/2017   Procedure: INTRAMEDULLARY (IM) NAIL INTERTROCHANTRIC;  Surgeon: Rod Can, MD;  Location: [facility];  Service: Orthopedics;  Laterality: Right;  . IR RADIOLOGIST EVAL & MGMT  03/29/2017  .  KIDNEY STONE SURGERY    . LAPAROSCOPIC PARTIAL HEPATECTOMY N/A 03/04/2015   Procedure: DIAGNOSTIC LAPAROSCOPY, EXTENDED RIGHT HEPATECTOMY, WITH  INTRAOPERATIVE ULTRASOUND;  Surgeon: Stark Klein, MD;  Location: [facility];  Service: General;  Laterality: N/A;  . PARTIAL HYSTERECTOMY    . portal vein embolization     01-13-15 -Dr. Cathlean Sauer.    I have reviewed the social history and family history with the patient and they are unchanged from previous note.  ALLERGIES:  is allergic to zoloft [sertraline hcl], amoxicillin-pot clavulanate, clindamycin/lincomycin, paxil [paroxetine hcl], paroxetine hcl, and sulfa antibiotics.  MEDICATIONS:  Current Outpatient Medications  Medication Sig Dispense Refill  . acetaminophen (TYLENOL) 325 MG tablet Take 2 tablets (650 mg total) by mouth every 6 (six) hours as needed for mild pain (or Fever >/= 101).    Marland Kitchen amLODipine (NORVASC) 5 MG tablet Take 0.5 tablets (2.5 mg total) by mouth daily. 45 tablet 3  . Aspirin-Acetaminophen-Caffeine (EXCEDRIN MIGRAINE PO) Take by mouth as needed.    . bisacodyl (DULCOLAX) 10 MG suppository Place 1 suppository (10 mg total) rectally daily as needed for moderate constipation. 12 suppository 0  . busPIRone (BUSPAR) 10 MG tablet Take 20 mg by mouth 3 (three) times daily.    . DULoxetine (CYMBALTA) 30 MG capsule Take 1 tablet by mouth daily.    . Glycopyrrolate-Formoterol (BEVESPI AEROSPHERE) 9-4.8 MCG/ACT AERO Inhale into the lungs 2 (two) times daily as needed.    Marland Kitchen HYDROcodone-acetaminophen (NORCO) 10-325 MG tablet Take 1 tablet by mouth in the morning, at noon, in the evening, and at bedtime.    Marland Kitchen LORazepam (ATIVAN) 0.5 MG tablet Take 0.5 mg by mouth as needed.    . melatonin 5 MG TABS melatonin 5 mg tablet  Take 1 tablet every day by oral route at bedtime.    . senna-docusate (SENOKOT-S) 8.6-50 MG tablet Take 1 tablet by mouth 2 (two) times daily as needed for mild constipation. 60 tablet 12   No current facility-administered medications for this visit.    PHYSICAL EXAMINATION: ECOG PERFORMANCE STATUS: 2 - Symptomatic, <50% confined to bed  Vitals:    06/25/20 1057  BP: (!) 149/81  Pulse: 89  Resp: 20  Temp: 98 F (36.7 C)  SpO2: (!) 89%  SPO2 86-90%  Filed Weights   06/25/20 1057  Weight: 120 lb 12.8 oz (54.8 kg)    GENERAL:alert, no distress and comfortable SKIN: No rash EYES: sclera clear LUNGS: Diminished with mild inspiratory wheezing throughout, normal breathing effort HEART: regular rate & rhythm, trace bilateral lower extremity edema ABDOMEN:abdomen soft, non-tender and normal bowel sounds.  No hepatomegaly or palpable mass NEURO: alert & oriented x 3 with fluent speech  LABORATORY DATA:  I have reviewed the data as listed CBC Latest Ref Rng & Units 06/25/2020 12/25/2019 02/19/2019  WBC 4.0 - 10.5 K/uL 6.5 5.9 8.3  Hemoglobin 12.0 - 15.0 g/dL 14.3 14.6 14.7  Hematocrit 36.0 - 46.0 % 45.2 46.3(H) 46.5(H)  Platelets 150 - 400 K/uL 218 212 286     CMP Latest Ref Rng & Units 06/25/2020 12/25/2019 06/17/2019  Glucose 70 - 99 mg/dL 98 99 -  BUN 8 - 23 mg/dL 12 15 -  Creatinine 0.44 - 1.00 mg/dL 0.68 0.70 0.60  Sodium 135 - 145 mmol/L 134(L) 135 -  Potassium 3.5 - 5.1 mmol/L 4.4 4.2 -  Chloride 98 - 111 mmol/L 94(L) 94(L) -  CO2 22 - 32 mmol/L 32 37(H) -  Calcium 8.9 - 10.3  mg/dL 8.6(L) 9.2 -  Total Protein 6.5 - 8.1 g/dL 7.1 7.1 -  Total Bilirubin 0.3 - 1.2 mg/dL 0.4 0.6 -  Alkaline Phos 38 - 126 U/L 75 75 -  AST 15 - 41 U/L 18 16 -  ALT 0 - 44 U/L 10 11 -      RADIOGRAPHIC STUDIES: I have personally reviewed the radiological images as listed and agreed with the findings in the report. No results found.   ASSESSMENT & PLAN: Tina Bailey a 75 y.o.femalewith history of  1. Hepatocellular carcinoma, pT3bN0M0, stage IIIB, poorly differentiated -Diagnosed in 11/2014. Treated with embolization and right hepatectomy. Currently on surveillance. -serial imaging showedno evidence of recurrence -plan to monitor at least 5 years  2. Pancreatic cystic lesion -Relatively stable over many years, followed by  GI Dr. Ardis Hughs  3. COPD and smoking cessation -f/u with PCPand Dr. Melvyn Novas -Quit smoking completelyearly 2020 -O2 86-90% in clinic with progressive dyspnea.  Does not use supplemental oxygen -Will obtain stat chest x-ray.  I recommend for her to call Dr. Melvyn Novas for follow-up  4.Chroniccompression fractureand multiple fractures and body pain  -She has had multiple fractures in her spine, sternum, and the right hip -f/u ortho, Dr. Idamae Schuller -Stable  5. Osteoporosis -Stopped FORTEO in 2018 because she didn't want injections -Currently on multivitamins and Vitamin d. Continue. -severe osteopenia noted on thoracic and lumbar CTs on 12/11/18.  -Continue f/u with ortho and pain specialist  6.  Anxiety and depression -On BuSpar and Cymbalta, Ativan as needed, and Norco for chronic pain -She lacks motivation and interest in certain activities lately, I offered active listening and emotional support and recommend follow-up with PCP  Disposition: Tina Bailey is clinically doing well.  Exam is benign, CBC and LFTs are normal, AFP is pending.  I reviewed her abdominal ultrasound from 06/22/2020 which shows no liver mass.  Overall there is no clinical concern for [place] recurrence.  She has worsening dyspnea with mild hypoxia today in the setting of chronic COPD.  Will obtain stat chest x-ray to rule out acute pathology.  I encouraged her to call Dr. Melvyn Novas for pulmonary follow-up.  She is over 5 years from initial [facility] diagnosis and treatment, continue surveillance.  We discussed the recurrence risk is decreased.  I recommend lab and abdominal ultrasound in 6 months with phone visit, then MRI and in person visit in 1 year.  We reviewed signs/symptoms of recurrent HCC, she will call sooner she develops new/worsening abdominal pain, bloating, signs of jaundice, unintentional weight loss, or severe fatigue.   Orders Placed This Encounter  Procedures  . DG Chest 2 View    Standing Status:   Future     Number of Occurrences:   1    Standing Expiration Date:   06/25/2021    Order Specific Question:   Reason for Exam (SYMPTOM  OR DIAGNOSIS REQUIRED)    Answer:   dyspnea, hypoxia    Order Specific Question:   Preferred imaging location?    Answer:   [facility]  . US Abdomen Limited    Standing Status:   Future    Standing Expiration Date:   06/25/2021    Order Specific Question:   Reason for Exam (SYMPTOM  OR DIAGNOSIS REQUIRED)    Answer:   h/o [place] s/p ablation and hepatectomy 2016, screening    Order Specific Question:   Preferred imaging location?    Answer:   [facility]  . MR Abdomen  W Wo Contrast    Standing Status:   Future    Standing Expiration Date:   06/25/2021    Order Specific Question:   If indicated for the ordered procedure, I authorize the administration of contrast media per Radiology protocol    Answer:   Yes    Order Specific Question:   What is the patient's sedation requirement?    Answer:   No Sedation    Order Specific Question:   Does the patient have a pacemaker or implanted devices?    Answer:   No    Order Specific Question:   Preferred imaging location?    Answer:   [facility] (table limit - 550 lbs)   All questions were answered. The patient knows to call the clinic with any problems, questions or concerns. No barriers to learning were detected.     Alla Feeling, NP 06/25/20

## 2020-06-25 ENCOUNTER — Other Ambulatory Visit: Payer: Self-pay

## 2020-06-25 ENCOUNTER — Inpatient Hospital Stay (HOSPITAL_BASED_OUTPATIENT_CLINIC_OR_DEPARTMENT_OTHER): Payer: Medicare HMO | Admitting: Nurse Practitioner

## 2020-06-25 ENCOUNTER — Inpatient Hospital Stay: Payer: Medicare HMO | Attending: Nurse Practitioner

## 2020-06-25 ENCOUNTER — Ambulatory Visit (HOSPITAL_COMMUNITY)
Admission: RE | Admit: 2020-06-25 | Discharge: 2020-06-25 | Disposition: A | Discharge status: Home / Self Care | Payer: Medicare HMO | Source: Ambulatory Visit | Attending: Nurse Practitioner | Admitting: Nurse Practitioner

## 2020-06-25 ENCOUNTER — Telehealth: Payer: Self-pay

## 2020-06-25 ENCOUNTER — Encounter: Payer: Self-pay | Admitting: Nurse Practitioner

## 2020-06-25 VITALS — BP 149/81 | HR 89 | Temp 98.0°F | Resp 20 | Ht 63.0 in | Wt 120.8 lb

## 2020-06-25 DIAGNOSIS — C220 Liver cell carcinoma: Secondary | ICD-10-CM | POA: Diagnosis not present

## 2020-06-25 DIAGNOSIS — Z87891 Personal history of nicotine dependence: Secondary | ICD-10-CM | POA: Insufficient documentation

## 2020-06-25 DIAGNOSIS — F419 Anxiety disorder, unspecified: Secondary | ICD-10-CM | POA: Insufficient documentation

## 2020-06-25 DIAGNOSIS — R0602 Shortness of breath: Secondary | ICD-10-CM | POA: Insufficient documentation

## 2020-06-25 DIAGNOSIS — J449 Chronic obstructive pulmonary disease, unspecified: Secondary | ICD-10-CM | POA: Insufficient documentation

## 2020-06-25 DIAGNOSIS — K862 Cyst of pancreas: Secondary | ICD-10-CM | POA: Diagnosis not present

## 2020-06-25 DIAGNOSIS — M1990 Unspecified osteoarthritis, unspecified site: Secondary | ICD-10-CM | POA: Diagnosis not present

## 2020-06-25 DIAGNOSIS — Z79899 Other long term (current) drug therapy: Secondary | ICD-10-CM | POA: Diagnosis not present

## 2020-06-25 DIAGNOSIS — F32A Depression, unspecified: Secondary | ICD-10-CM | POA: Insufficient documentation

## 2020-06-25 DIAGNOSIS — R0902 Hypoxemia: Secondary | ICD-10-CM | POA: Diagnosis not present

## 2020-06-25 DIAGNOSIS — R69 Illness, unspecified: Secondary | ICD-10-CM | POA: Diagnosis not present

## 2020-06-25 DIAGNOSIS — Z8505 Personal history of malignant neoplasm of liver: Secondary | ICD-10-CM | POA: Diagnosis not present

## 2020-06-25 LAB — CBC WITH DIFFERENTIAL/PLATELET
Abs Immature Granulocytes: 0.02 10*3/uL (ref 0.00–0.07)
Basophils Absolute: 0 10*3/uL (ref 0.0–0.1)
Basophils Relative: 1 %
Eosinophils Absolute: 0.2 10*3/uL (ref 0.0–0.5)
Eosinophils Relative: 3 %
HCT: 45.2 % (ref 36.0–46.0)
Hemoglobin: 14.3 g/dL (ref 12.0–15.0)
Immature Granulocytes: 0 %
Lymphocytes Relative: 28 %
Lymphs Abs: 1.8 10*3/uL (ref 0.7–4.0)
MCH: 30.3 pg (ref 26.0–34.0)
MCHC: 31.6 g/dL (ref 30.0–36.0)
MCV: 95.8 fL (ref 80.0–100.0)
Monocytes Absolute: 0.6 10*3/uL (ref 0.1–1.0)
Monocytes Relative: 9 %
Neutro Abs: 3.8 10*3/uL (ref 1.7–7.7)
Neutrophils Relative %: 59 %
Platelets: 218 10*3/uL (ref 150–400)
RBC: 4.72 MIL/uL (ref 3.87–5.11)
RDW: 12.7 % (ref 11.5–15.5)
WBC: 6.5 10*3/uL (ref 4.0–10.5)
nRBC: 0 % (ref 0.0–0.2)

## 2020-06-25 LAB — COMPREHENSIVE METABOLIC PANEL
ALT: 10 U/L (ref 0–44)
AST: 18 U/L (ref 15–41)
Albumin: 4.1 g/dL (ref 3.5–5.0)
Alkaline Phosphatase: 75 U/L (ref 38–126)
Anion gap: 8 (ref 5–15)
BUN: 12 mg/dL (ref 8–23)
CO2: 32 mmol/L (ref 22–32)
Calcium: 8.6 mg/dL — ABNORMAL LOW (ref 8.9–10.3)
Chloride: 94 mmol/L — ABNORMAL LOW (ref 98–111)
Creatinine, Ser: 0.68 mg/dL (ref 0.44–1.00)
GFR, Estimated: >60 mL/min (ref >60)
Glucose, Bld: 98 mg/dL (ref 70–99)
Potassium: 4.4 mmol/L (ref 3.5–5.1)
Sodium: 134 mmol/L — ABNORMAL LOW (ref 135–145)
Total Bilirubin: 0.4 mg/dL (ref 0.3–1.2)
Total Protein: 7.1 g/dL (ref 6.5–8.1)

## 2020-06-25 NOTE — Telephone Encounter (Signed)
Tina Bailey has been made aware of the chest xray results and verbalized understanding to follow up with Dr. Melvyn Novas. A copy of the XR has been mailed to the patient per her request.

## 2020-06-25 NOTE — Telephone Encounter (Signed)
-----   Message from Alla Feeling, NP sent at 06/25/2020  1:53 PM EDT ----- Please call to let pt know chest xray is stable, no new evidence of infection, fluid, or acute change. I recommend to f/up with Dr. Melvyn Novas and to monitor her pulse ox at home.   Thanks, Regan Rakers, NP

## 2020-06-26 LAB — AFP TUMOR MARKER: AFP, Serum, Tumor Marker: 3.5 ng/mL (ref 0.0–9.2)

## 2020-06-30 ENCOUNTER — Telehealth: Payer: Self-pay | Admitting: Hematology

## 2020-06-30 ENCOUNTER — Telehealth: Payer: Self-pay

## 2020-06-30 NOTE — Telephone Encounter (Signed)
-----   Message from Alla Feeling, NP sent at 06/30/2020  1:09 PM EDT ----- Please let her know AFP is normal, no concern for [facility] recurrence/progression.   Thanks, Regan Rakers, NP

## 2020-06-30 NOTE — Telephone Encounter (Signed)
Scheduled follow-up appointments per 4/7 los. Patient is aware. Mailed calendar.

## 2020-06-30 NOTE — Telephone Encounter (Signed)
Tina Bailey has been made aware of her lab results and verbalized understanding. A copy of her most recent lab results have been mailed to her per her request.

## 2020-07-24 DIAGNOSIS — R69 Illness, unspecified: Secondary | ICD-10-CM | POA: Diagnosis not present

## 2020-07-24 DIAGNOSIS — F064 Anxiety disorder due to known physiological condition: Secondary | ICD-10-CM | POA: Diagnosis not present

## 2020-07-24 DIAGNOSIS — F5109 Other insomnia not due to a substance or known physiological condition: Secondary | ICD-10-CM | POA: Diagnosis not present

## 2020-07-31 ENCOUNTER — Encounter: Payer: Self-pay | Admitting: Internal Medicine

## 2020-07-31 ENCOUNTER — Ambulatory Visit: Payer: Medicare HMO | Admitting: Internal Medicine

## 2020-07-31 ENCOUNTER — Other Ambulatory Visit: Payer: Self-pay

## 2020-07-31 VITALS — BP 108/70 | HR 80 | Temp 97.8°F | Ht 63.0 in | Wt 122.4 lb

## 2020-07-31 DIAGNOSIS — J449 Chronic obstructive pulmonary disease, unspecified: Secondary | ICD-10-CM | POA: Diagnosis not present

## 2020-07-31 DIAGNOSIS — R0902 Hypoxemia: Secondary | ICD-10-CM

## 2020-07-31 DIAGNOSIS — J9611 Chronic respiratory failure with hypoxia: Secondary | ICD-10-CM | POA: Diagnosis not present

## 2020-07-31 MED ORDER — ALBUTEROL SULFATE (2.5 MG/3ML) 0.083% IN NEBU: 2.5000 mg | INHALATION_SOLUTION | RESPIRATORY_TRACT | 12 refills | Status: DC | PRN

## 2020-07-31 MED ORDER — BREZTRI AEROSPHERE 160-9-4.8 MCG/ACT IN AERO: 2.0000 | INHALATION_SPRAY | Freq: Two times a day (BID) | RESPIRATORY_TRACT | 0 refills | Status: DC

## 2020-07-31 NOTE — Patient Instructions (Addendum)
Make sure you check your oxygen saturation  at your highest level of activity  to be sure it stays over 90% and adjust  02 flow upward to maintain this level if needed but remember to turn it back to previous settings when you stop (to conserve your supply).  Once you've rested for a minute or two you do not need to wear 02 at rest or just moving about in your room.    Work on inhaler technique:  relax and gently blow all the way out then take a nice smooth deep breath back in, triggering the inhaler at same time you start breathing in.  Hold for up to 5 seconds if you can  Rinse and gargle with water when done.   Plan A = Automatic = Always=   Besvespi (or Breztri sample) Take 2 puffs first thing in am and then another 2 puffs about 12 hours later   Plan B = Backup (to supplement plan A, not to replace it) Only use your albuterol nebulizer as a rescue medication to be used if you can't catch your breath by resting or doing a relaxed purse lip breathing pattern.  - The less you use it, the better it will work when you need it. - Ok to use up to   every 4 hours if you must but call for appointment if use goes up over your usual need     Please schedule a follow up visit in 3 months but call sooner if needed

## 2020-07-31 NOTE — Progress Notes (Signed)
Subjective:    Patient ID: Tina Bailey, female    DOB: 06-10-1945,    MRN: [id number]    Brief patient profile: 32 yowf quit smoking 2019  with mostly c/o chronic cough x years attributed to pnds with dx of copd in 2008 but not on resp rx and referred to pulmonary clinic 03/03/2015 by Dr Barry Dienes with GOLD III criteria on spirometry     History of Present Illness  03/03/2015 1st [facility] office visit/ Tina Bailey   Chief Complaint  Patient presents with  . PULMONARY CONSULT    Referred by Dr. Barry Dienes. Pt needs surgical clearance for liver resection tomorrow. Pt was told she has "mild" emphysema. Pt c/o occasional wheeze, cough with creamy thick mucus and some sinus/nasal congestion and drainage. Pt does smoke. Pt does not use inhalers.   cough x years esp in am's but Not limited by breathing from desired activities  Able to shop ok- no hcp Total amt of mucus typically < 2 tsp / white/ not usually bloody or purulent nor is it now rec You have moderately severe copd and need to quit smoking now but it is not prohibitive to surgery on your liver You are taking a risk by smoking up until the day of surgery > ideally you need to stop 2 weeks prior if at all possible to improve cough mechanics/ reduce sputum production  We can use duoneb (breathing treatments) perioperatively if needed     03/04/15  Liver surgery for hepatocellular ca / no chemo/RT but chronic pain since then     02/02/2017  f/u ov/Tina Bailey re:  GOLD III/ still smoking  Chief Complaint  Patient presents with  . Pulmonary Consult    Referred by Dr. Annye Asa for eval of hypoxia. Pt states that she was admitted to the hospital for "a light touch of bronchitis" and CP 12/21/16-12/22/16 and was sent home with o2. She has not used o2 recently b/c she feels it's not needed.   can do [employer] leaning on cart stopping freq = MMRC3 = can't walk 100 yards even at a slow pace at a flat grade s stopping due to sob s  02 No 02 sleeps ok / but sats drop to 86% s 02 when she wakes up  Not using any maintenance  resp medications Uses neb qod on avg rec The key is to stop smoking completely before smoking completely stops you!  Start Trelegy one click  - two good drags first thing in am 02 rec is 2lpm at bedtime and goal for daytime is keep over 90% at all times so as improve probably won't need daytime full time    05/12/2017  f/u ov/Tina Bailey re:  GOLD IV  / has 02 not using  Chief Complaint  Patient presents with  . Follow-up    PFT's done today. She states her breathing is back at her normal baseline. She rarely uses her proair.   Dyspnea:  MMRC3 = can't walk 100 yards even at a slow pace at a flat grade s stopping due to sob  Cough: randomly coughs up tsp - tbps slt yellow x few weeks Sleep: fine  Flat most nights Uses neb 3 x weekly but very rarely any proair Feels trelegy powder choking her No longer using 02 and wants it out of her house  rec  The key is to stop smoking completely before smoking completely stops you!  Plan A = Automatic = Bevespi Take  2 puffs first thing in am and then another 2 puffs about 12 hours later.  Work on inhaler technique: Plan B = Backup Only use your albuterol (PROAIR) as a rescue medication  Plan C = Crisis - only use your albuterol nebulizer if you first try Plan B  We will cancel your 02 You cleared for surgery with acceptable risk    07/31/2020  f/u ov/Tina Bailey re:  GOLD IV / ? 02 dep  Chief Complaint  Patient presents with  . Consult    Pt states she has been having problems with her breathing and due to this, she has been more fatigued. When pt's O2 sats have been checked recently, her sats are in the 80s and this is on room air. Denies any complaints of cough or wheezing.  Dyspnea:  Hallway with rollator with drop in sats on  Reported x months Cough: no Sleeping: flat /on side / one pillow no no resp SABA use: on bevespi 2bid no real change  02: none  Covid  status:   Never vax but omicron 02/2020  Generally worse since fell and injured chest wall    No obvious day to day or daytime variability or assoc excess/ purulent sputum or mucus plugs or hemoptysis or cp or chest tightness, subjective wheeze or overt sinus or hb symptoms.   sleeping as above without nocturnal  or early am exacerbation  of respiratory  c/o's or need for noct saba. Also denies any obvious fluctuation of symptoms with weather or environmental changes or other aggravating or alleviating factors except as outlined above   No unusual exposure hx or h/o childhood pna/ asthma or knowledge of premature birth.  Current Allergies, Complete Past Medical History, Past Surgical History, Family History, and Social History were reviewed in [employer] record.  ROS  The following are not active complaints unless bolded Hoarseness, sore throat, dysphagia, dental problems, itching, sneezing,  nasal congestion or discharge of excess mucus or purulent secretions, ear ache,   fever, chills, sweats, unintended wt loss or wt gain, classically pleuritic or exertional cp,  orthopnea pnd or arm/hand swelling  or leg swelling, presyncope, palpitations, abdominal pain, anorexia, nausea, vomiting, diarrhea  or change in bowel habits or change in bladder habits, change in stools or change in urine, dysuria, hematuria,  rash, arthralgias, visual complaints, headache, numbness, weakness or ataxia or problems with walking or coordination,  change in mood or  memory.        Current Meds  Medication Sig  . acetaminophen (TYLENOL) 325 MG tablet Take 2 tablets (650 mg total) by mouth every 6 (six) hours as needed for mild pain (or Fever >/= 101).  Marland Kitchen amLODipine (NORVASC) 2.5 MG tablet Take 2.5 mg by mouth daily.  . benzonatate (TESSALON) 100 MG capsule Take by mouth 3 (three) times daily as needed for cough.  . Bismuth Subsalicylate 528 UX/32GM SUSP Take 30 mLs by mouth every 8 (eight) hours  as needed.  . busPIRone (BUSPAR) 10 MG tablet Take 20 mg by mouth 3 (three) times daily.  . DULoxetine (CYMBALTA) 30 MG capsule Take 1 tablet by mouth daily.  . fluticasone (FLONASE) 50 MCG/ACT nasal spray Place 2 sprays into both nostrils daily as needed for allergies or rhinitis.  . Glycopyrrolate-Formoterol (BEVESPI AEROSPHERE) 9-4.8 MCG/ACT AERO Inhale into the lungs 2 (two) times daily as needed.  Marland Kitchen HYDROcodone-acetaminophen (NORCO) 10-325 MG tablet Take 1 tablet by mouth in the morning, at noon, in the evening, and  at bedtime.  Marland Kitchen LORazepam (ATIVAN) 0.5 MG tablet Take 0.5 mg by mouth as needed.  . melatonin 5 MG TABS melatonin 5 mg tablet  Take 1 tablet every day by oral route at bedtime.  . metaxalone (SKELAXIN) 800 MG tablet Take 800 mg by mouth every 8 (eight) hours as needed for muscle spasms.  . promethazine (PHENERGAN) 12.5 MG tablet Take 12.5 mg by mouth every 6 (six) hours as needed for nausea or vomiting.  . [DISCONTINUED] amLODipine (NORVASC) 5 MG tablet Take 0.5 tablets (2.5 mg total) by mouth daily.                  Objective:   Physical Exam       05/12/2017        92   02/02/2017     98   03/03/15 109 lb 6.4 oz (49.624 kg)  02/26/15 110 lb (49.896 kg)  01/13/15 114 lb (51.71 kg)     Vital signs reviewed  07/31/2020  - Note at rest 02 sats  93% onRA  General appearance:    Elderly wf / sitting on rollator    HEENT : pt wearing mask not removed for exam due to covid -19 concerns.    NECK :  without JVD/Nodes/TM/ nl carotid upstrokes bilaterally   LUNGS: no acc muscle use,  Mod barrel / kypohtic contour chest wall with bilateral  Distant bs s audible wheeze and  without cough on insp or exp maneuvers and mod  Hyperresonant  to  percussion bilaterally     CV:  RRR  no s3 or murmur or increase in P2, and no edema   ABD:  soft and nontender with pos mid insp Hoover's  in the supine position. No bruits or organomegaly appreciated, bowel sounds nl  MS:      ext warm without deformities, calf tenderness, cyanosis or clubbing No obvious joint restrictions   SKIN: warm and dry without lesions    NEURO:  alert, approp, nl sensorium with  no motor or cerebellar deficits apparent.            I personally reviewed images and agree with radiology impression as follows:  CXR:   06/25/20 1.  Aneurysmal dilatation of the thoracic aorta again noted. 2.  No acute pulmonary disease.      Assessment & Plan:

## 2020-08-01 ENCOUNTER — Encounter: Payer: Self-pay | Admitting: Internal Medicine

## 2020-08-01 DIAGNOSIS — R0902 Hypoxemia: Secondary | ICD-10-CM | POA: Insufficient documentation

## 2020-08-01 NOTE — Assessment & Plan Note (Signed)
07/31/2020  after desat to 85%ra with sob  < 250 ft, pt placed on POC 2lpm pulsed and sats increased to 93% and she walked additional 2 laps = 500 ft with sat at end 91%2lpm pulsed/moderate pace and minimal sob   rec 2lpm with activity beyond the confines of her room.  Advised: Make sure you check your oxygen saturation  at your highest level of activity  to be sure it stays over 90% and adjust  02 flow upward to maintain this level if needed but remember to turn it back to previous settings when you stop (to conserve your supply = none needed at rest)   Each maintenance medication was reviewed in detail including emphasizing most importantly the difference between maintenance and prns and under what circumstances the prns are to be triggered using an action plan format where appropriate.  Total time for H and P, chart review, counseling,  directly observing portions of ambulatory 02 saturation study/ and generating customized AVS unique to this office visit / same day charting = 30 min

## 2020-08-01 NOTE — Assessment & Plan Note (Addendum)
Quit smoking 2019 Spirometry 03/03/2015   FEV1  1.04 (44%) ratio 58  - 03/03/2015  Walked RA x 3 laps @ 185 ft each stopped due to  End of study, brisk pace, no sob or desat   -  02/02/2017   Walked RA x one lap @ 185 stopped due to  Back pain/ sob no desats @ nl pace - 02/02/2017  After extensive coaching device effectiveness =    90% with DPI > try trelegy x one month then return for pfts  - Spirometry 05/12/2017  FEV1 0.58 (27%)  Ratio 44  With trelegy prior and no resp to saba  - 05/12/2017   try bevespi instead of trelegy due to throat irritation   - 07/31/2020  After extensive coaching inhaler device,  effectiveness =    50% with hfa but prefers over elipta    Pt is Group B in terms of symptom/risk and laba/lama therefore appropriate rx at this point >>>  bevespi 2bid and prn saba   stiolto another option but did not like the DPI's so anoro is not   If all else fails can back up with neb albuterol  up to q 4h prn

## 2020-08-03 DIAGNOSIS — R69 Illness, unspecified: Secondary | ICD-10-CM | POA: Diagnosis not present

## 2020-08-03 DIAGNOSIS — J449 Chronic obstructive pulmonary disease, unspecified: Secondary | ICD-10-CM | POA: Diagnosis not present

## 2020-08-03 DIAGNOSIS — F5109 Other insomnia not due to a substance or known physiological condition: Secondary | ICD-10-CM | POA: Diagnosis not present

## 2020-08-04 DIAGNOSIS — J9611 Chronic respiratory failure with hypoxia: Secondary | ICD-10-CM | POA: Diagnosis not present

## 2020-08-04 DIAGNOSIS — N390 Urinary tract infection, site not specified: Secondary | ICD-10-CM | POA: Diagnosis not present

## 2020-08-06 DIAGNOSIS — G8929 Other chronic pain: Secondary | ICD-10-CM | POA: Diagnosis not present

## 2020-08-06 DIAGNOSIS — M546 Pain in thoracic spine: Secondary | ICD-10-CM | POA: Diagnosis not present

## 2020-08-06 DIAGNOSIS — E782 Mixed hyperlipidemia: Secondary | ICD-10-CM | POA: Diagnosis not present

## 2020-08-06 DIAGNOSIS — Z79891 Long term (current) use of opiate analgesic: Secondary | ICD-10-CM | POA: Diagnosis not present

## 2020-08-06 DIAGNOSIS — E119 Type 2 diabetes mellitus without complications: Secondary | ICD-10-CM | POA: Diagnosis not present

## 2020-08-06 DIAGNOSIS — E559 Vitamin D deficiency, unspecified: Secondary | ICD-10-CM | POA: Diagnosis not present

## 2020-08-06 DIAGNOSIS — E030 Congenital hypothyroidism with diffuse goiter: Secondary | ICD-10-CM | POA: Diagnosis not present

## 2020-08-06 DIAGNOSIS — G894 Chronic pain syndrome: Secondary | ICD-10-CM | POA: Diagnosis not present

## 2020-08-06 DIAGNOSIS — I10 Essential (primary) hypertension: Secondary | ICD-10-CM | POA: Diagnosis not present

## 2020-08-10 DIAGNOSIS — Z03818 Encounter for observation for suspected exposure to other biological agents ruled out: Secondary | ICD-10-CM | POA: Diagnosis not present

## 2020-08-19 DIAGNOSIS — Z03818 Encounter for observation for suspected exposure to other biological agents ruled out: Secondary | ICD-10-CM | POA: Diagnosis not present

## 2020-08-23 DIAGNOSIS — Z03818 Encounter for observation for suspected exposure to other biological agents ruled out: Secondary | ICD-10-CM | POA: Diagnosis not present

## 2020-08-27 NOTE — Progress Notes (Signed)
Cardiology Office Note:    Date:  08/28/2020   ID:  Tina Bailey, DOB 29-Aug-1945, MRN [id number]  PCP:  Roetta Sessions, NP  Cardiologist:  Buford Dresser, MD  Referring MD: Roetta Sessions*   No chief complaint on file.  CC: follow up  History of Present Illness:    Tina Bailey is a 75 y.o. female with a hx of hepatocellular carcinoma s/p right hepatectomy 2016, chronic joint pain due to arthritis, history of tobacco abuse (~50 years), COPD who is seen in follow up today. She was initially seen as a new patient to me (last seen by Dr. Oval Linsey in 2016 for preop clearance) at the request of Roetta Sessions* for the evaluation and management of chronic diastolic heart failure on 04/13/18.  Cardiac history: She was admitted for LE edema in 02/2018. She had several fractures (femur, sternum) in the fall and noted several weeks after that she developed LE edema. Doppler negative for DVT. Echo done during that hospitalization was notable for grade 1 diastolic dysfunction, severe pulmonary hypertension, and right atrial mass. Declined further evaluation of right atrial mass and routine monitoring of TAA, as she states she would never want CT surgery.   Today: She is accompanied by her daughter Tina Bailey, who also provides some history. Recently she has not monitored her blood pressure at home. She has a new blood pressure cuff and will begin monitoring again. We discussed the importance of how she feels when blood pressures run high or low.  She is on oxygen at this time. Recently when visiting her pulmonologist her O2 saturation was in the 70s-80s%. She is unsure if she feels better on oxygen. She notes she is able to walk longer distances because of being on oxygen, but also becomes more sore after walking. Typically her LE muscles ache later in the day after her walking, not immediately while she is walking.  Her daughter reports she has been a little  depressed in the past 2 months. This is partly due to worry about being on medications long-term. Also, she is debating starting her vitamin D3 supplement again.  She denies any chest pain or palpitations. No headaches, lightheadedness, or syncope to report. Also has no orthopnea or PND.  Past Medical History:  Diagnosis Date   Arthritis    DDD. Right shoulder"is frozen"-limited ROM. osteoporosis.   Blue toe syndrome ([place]) 11/27/2014   Dr. Claudia Pollock evaluating   CHF (congestive heart failure) (HCC)    Complication of anesthesia    COPD (chronic obstructive pulmonary disease) ([place])    Fibromyalgia    Fracture of rib of right side    hx "osteoporosis"- states her dog nudge her on the side, next day developed great pain and was told has a fracture rib.   Liver cancer ([place]) 12/2014   Macular degeneration    R eye   Osteoporosis    PONV (postoperative nausea and vomiting)    nausea,severe vomiting after 01-13-15 portal vein embolization   Productive cough    Retina disorder    L eye, vision distorted, edema   Spine fracture due to birth trauma    TMJ disease    Wears glasses     Past Surgical History:  Procedure Laterality Date   ANGIOPLASTY  2008   no stents required, no follow-up with cardiologist, no recurrent chest pain   CATARACT EXTRACTION, BILATERAL Bilateral    ESOPHAGOGASTRODUODENOSCOPY (EGD) WITH PROPOFOL N/A 06/25/2015   Procedure: ESOPHAGOGASTRODUODENOSCOPY (EGD) WITH PROPOFOL;  Surgeon: Milus Banister, MD;  Location: [facility];  Service: Endoscopy;  Laterality: N/A;  stent removal    EYE SURGERY     cornea surgery   INTRAMEDULLARY (IM) NAIL INTERTROCHANTERIC Right 12/04/2017   Procedure: INTRAMEDULLARY (IM) NAIL INTERTROCHANTRIC;  Surgeon: Rod Can, MD;  Location: [facility];  Service: Orthopedics;  Laterality: Right;   IR RADIOLOGIST EVAL & MGMT  03/29/2017   KIDNEY STONE SURGERY     LAPAROSCOPIC PARTIAL HEPATECTOMY N/A 03/04/2015   Procedure: DIAGNOSTIC  LAPAROSCOPY, EXTENDED RIGHT HEPATECTOMY, WITH INTRAOPERATIVE ULTRASOUND;  Surgeon: Stark Klein, MD;  Location: [facility];  Service: General;  Laterality: N/A;   PARTIAL HYSTERECTOMY     portal vein embolization     01-13-15 -Dr. Cathlean Sauer.    Current Medications: Current Outpatient Medications on File Prior to Visit  Medication Sig   acetaminophen (TYLENOL) 325 MG tablet Take 2 tablets (650 mg total) by mouth every 6 (six) hours as needed for mild pain (or Fever >/= 101).   albuterol (PROVENTIL) (2.5 MG/3ML) 0.083% nebulizer solution Take 3 mLs (2.5 mg total) by nebulization every 4 (four) hours as needed for wheezing or shortness of breath.   amLODipine (NORVASC) 2.5 MG tablet Take 2.5 mg by mouth daily.   aspirin-acetaminophen-caffeine (EXCEDRIN MIGRAINE) 250-250-65 MG tablet Take by mouth every 6 (six) hours as needed for headache.   benzonatate (TESSALON) 100 MG capsule Take by mouth 3 (three) times daily as needed for cough.   Bismuth Subsalicylate 973 ZH/29JM SUSP Take 30 mLs by mouth every 8 (eight) hours as needed.   busPIRone (BUSPAR) 10 MG tablet Take 20 mg by mouth 3 (three) times daily.   DULoxetine (CYMBALTA) 30 MG capsule Take 1 tablet by mouth daily.   fluticasone (FLONASE) 50 MCG/ACT nasal spray Place 2 sprays into both nostrils daily as needed for allergies or rhinitis.   Glycopyrrolate-Formoterol (BEVESPI AEROSPHERE) 9-4.8 MCG/ACT AERO Inhale into the lungs 2 (two) times daily as needed.   HYDROcodone-acetaminophen (NORCO) 10-325 MG tablet Take 1 tablet by mouth in the morning, at noon, in the evening, and at bedtime.   LORazepam (ATIVAN) 0.5 MG tablet Take 0.5 mg by mouth as needed.   melatonin 5 MG TABS melatonin 5 mg tablet  Take 1 tablet every day by oral route at bedtime.   promethazine (PHENERGAN) 12.5 MG tablet Take 12.5 mg by mouth every 6 (six) hours as needed for nausea or vomiting.   No current facility-administered medications on file prior to visit.      Allergies:   Zoloft [sertraline hcl], Amoxicillin-pot clavulanate, Clindamycin/lincomycin, Paxil [paroxetine hcl], Paroxetine hcl, and Sulfa antibiotics   Social History   Tobacco Use   Smoking status: Former    Packs/day: 1.00    Years: 45.00    Pack years: 45.00    Types: Cigarettes    Quit date: 02/20/2018    Years since quitting: 2.5   Smokeless tobacco: Never  Vaping Use   Vaping Use: Never used  Substance Use Topics   Alcohol use: No    Alcohol/week: 0.0 standard drinks   Drug use: No    Family History: The patient's family history includes Atrial fibrillation in her mother; Breast cancer in her mother; Dementia in her father; Heart attack in her brother; Heart disease in her brother and mother; Hypertension in her mother; Peptic Ulcer in her father; Stroke in her father.  ROS:   Please see the history of present illness.   (+) Bilateral LE pain, ache (+)  Depression Additional pertinent ROS otherwise unremarkable.  EKGs/Labs/Other Studies Reviewed:    The following studies were reviewed today:  Arterial Duplex 05/17/2018: Summary:  Right: Total occlusion noted throughout the superficial femoral artery  with reconstitution / branches in the distal femoral artery feeding the  artery on distally .   Left: Small contracted common femoral artery and superficial femoral  artery with minimal flow extending into the distal femoral artery with  reconstitution by way of branches feeding the artery on distally.   ABI 05/17/2018: Summary:  Right: Resting right ankle-brachial index indicates moderate right lower  extremity arterial disease. The right toe-brachial index is abnormal.   Left: Resting left ankle-brachial index indicates mild left lower  extremity arterial disease. The left toe-brachial index is abnormal.  Echo 03/20/18 - Left ventricle: The cavity size was normal. Systolic function was   normal. The estimated ejection fraction was in the range of 55%   to  60%. Wall motion was normal; there were no regional wall   motion abnormalities. Doppler parameters are consistent with   abnormal left ventricular relaxation (grade 1 diastolic   dysfunction). - Right ventricle: The cavity size was mildly dilated. Wall   thickness was normal. Systolic function was mildly reduced. - Right atrium: There was a mass in the atrial cavity adjacent to   the intra-atrial septum. - Tricuspid valve: There was moderate regurgitation. - Pulmonary arteries: Systolic pressure was severely increased. PA   peak pressure: 60 mm Hg (S). - Pericardium, extracardiac: A small pericardial effusion was   identified. There is mitral inflow respiratory variability, but   no clear RA/RV collapse.   Impressions:   - Normal LV EF, grade 1 diastolic dysfunction.   There is a structure in the right atrial cavity adjacent to the   intra-atrial septum that cannot be well defined. Could be very   hypertrophied septum, though favor other etiology, cannot exclude   thrombus, myxoma, or other.   Severe pulmonary hypertension, with mild RV dysfunction and   dilation.   Small pericardial effusion, predominantly posterior. There is   mitral inflow variability, though this may be unrelated to   effusion. No RA/RV collapse seen.  Arterial duplex 02/20/18 Right: Total occlusion noted in the superficial femoral artery with reconstitution in the popliteal artery.  ABI 02/20/18 Right: Resting right ankle-brachial index indicates moderate right lower extremity arterial disease. The right toe-brachial index is abnormal.   Left: Resting left ankle-brachial index indicates mild left lower extremity arterial disease. The left toe-brachial index is abnormal.  Nuclear stress 01/12/15 Nuclear stress EF: 67%. There was no ST segment deviation noted during stress. The study is normal. This is a low risk study. The left ventricular ejection fraction is hyperdynamic (>65%).   EKG:  EKG is  personally reviewed.   08/28/2020:  not ordered today 02/28/20: SR with PVCs.  Recent Labs: 06/25/2020: ALT 10; BUN 12; Creatinine, Ser 0.68; Hemoglobin 14.3; Platelets 218; Potassium 4.4; Sodium 134  Recent Lipid Panel    Component Value Date/Time   CHOL 188 01/09/2017 1109   TRIG 71.0 01/09/2017 1109   HDL 91.40 01/09/2017 1109   CHOLHDL 2 01/09/2017 1109   VLDL 14.2 01/09/2017 1109   LDLCALC 82 01/09/2017 1109    Physical Exam:    VS:  BP 132/76 (BP Location: Left Arm, Patient Position: Sitting, Cuff Size: Normal)   Pulse 80   Ht _0  (1.6 m)   Wt 126 lb 3.2 oz (57.2 kg)   SpO2 95%  BMI 22.36 kg/m     Wt Readings from Last 3 Encounters:  08/28/20 126 lb 3.2 oz (57.2 kg)  07/31/20 122 lb 6.4 oz (55.5 kg)  06/25/20 120 lb 12.8 oz (54.8 kg)   GEN: Well nourished, well developed in no acute distress. On oxygen HEENT: Normal, moist mucous membranes NECK: No JVD appreciated sitting upright. CARDIAC: regular rhythm, normal S1 and S2, no rubs or gallops. No murmur. VASCULAR: Radial and DP pulses 2+ bilaterally. No carotid bruits RESPIRATORY:  distant, but clear to auscultation without rales, wheezing or rhonchi  ABDOMEN: Soft, non-tender, non-distended MUSCULOSKELETAL:  Ambulates independently SKIN: Warm and dry, trivial LE edema. NEUROLOGIC:  Alert and oriented x 3. No focal neuro deficits noted. PSYCHIATRIC:  Normal affect    ASSESSMENT:    1. Essential hypertension   2. Chronic diastolic heart failure (HCC)   3. Pulmonary hypertension, unspecified (HCC)   4. Other emphysema (HCC)     PLAN:    Chronic diastolic heart failure, severe pulmonary hypertension on echo, history of severe bilateral LE edema -improved from when I met her and recently has been stable -continue elevation, compression as tolerated, salt avoidance -continue tobacco abstinence -has previously endorsed that she does not want invasive testing (such as right heart cath) or surgery   Labile  blood pressure readings and history of hypertension -had intermittently very elevated BP.  -well controlled to borderline low on amlodipine, with fatigue. Dropped amlodipine dose to 2.5 mg and tolerating well -we reviewed goals, sign/symptoms that need attention   Thoracic aortic aneurysm: as previously noted, she would not want surgery, does not wish to follow for monitoring  Plan for follow up: 6 mos or sooner as needed   Buford Dresser, MD, PhD, [facility]   Medication Adjustments/Labs and Tests Ordered: Current medicines are reviewed at length with the patient today.  Concerns regarding medicines are outlined above.  No orders of the defined types were placed in this encounter.  No orders of the defined types were placed in this encounter.   Patient Instructions  Medication Instructions:  Your Physician recommend you continue on your current medication as directed.    *If you need a refill on your cardiac medications before your next appointment, please call your pharmacy*   Lab Work: None ordered today   Testing/Procedures: None ordered today   Follow-Up: At [facility], you and your health needs are our priority.  As part of our continuing mission to provide you with exceptional heart care, we have created designated Provider Care Teams.  These Care Teams include your primary Cardiologist (physician) and Advanced Practice Providers (APPs -  Physician Assistants and Nurse Practitioners) who all work together to provide you with the care you need, when you need it.  We recommend signing up for the patient portal called "MyChart".  Sign up information is provided on this After Visit Summary.  MyChart is used to connect with patients for Virtual Visits (Telemedicine).  Patients are able to view lab/test results, encounter notes, upcoming appointments, etc.  Non-urgent messages can be sent to your provider as well.   To learn more about what you  can do with MyChart, go to [email or web address].    Your next appointment:   6 month(s) @ [address] [place], [place] [address]   The format for your next appointment:   In Person  Provider:   Buford Dresser, MD     [facility],acting as a scribe for [employer]  Harrell Gave, MD.,have documented all relevant documentation on the behalf of Buford Dresser, MD,as directed by  Buford Dresser, MD while in the presence of Buford Dresser, MD.  I, Buford Dresser, MD, have reviewed all documentation for this visit. The documentation on 08/28/20 for the exam, diagnosis, procedures, and orders are all accurate and complete.   Signed, Buford Dresser, MD PhD 08/28/2020     [place]

## 2020-08-28 ENCOUNTER — Other Ambulatory Visit: Payer: Self-pay

## 2020-08-28 ENCOUNTER — Ambulatory Visit: Payer: Medicare HMO | Admitting: Cardiology

## 2020-08-28 ENCOUNTER — Encounter: Payer: Self-pay | Admitting: Cardiology

## 2020-08-28 VITALS — BP 132/76 | HR 80 | Ht 63.0 in | Wt 126.2 lb

## 2020-08-28 DIAGNOSIS — I272 Pulmonary hypertension, unspecified: Secondary | ICD-10-CM

## 2020-08-28 DIAGNOSIS — J438 Other emphysema: Secondary | ICD-10-CM

## 2020-08-28 DIAGNOSIS — I5032 Chronic diastolic (congestive) heart failure: Secondary | ICD-10-CM

## 2020-08-28 DIAGNOSIS — I10 Essential (primary) hypertension: Secondary | ICD-10-CM

## 2020-08-28 NOTE — Patient Instructions (Signed)
Medication Instructions:  Your Physician recommend you continue on your current medication as directed.    *If you need a refill on your cardiac medications before your next appointment, please call your pharmacy*   Lab Work: None ordered today   Testing/Procedures: None ordered today   Follow-Up: At [facility], you and your health needs are our priority.  As part of our continuing mission to provide you with exceptional heart care, we have created designated Provider Care Teams.  These Care Teams include your primary Cardiologist (physician) and Advanced Practice Providers (APPs -  Physician Assistants and Nurse Practitioners) who all work together to provide you with the care you need, when you need it.  We recommend signing up for the patient portal called "MyChart".  Sign up information is provided on this After Visit Summary.  MyChart is used to connect with patients for Virtual Visits (Telemedicine).  Patients are able to view lab/test results, encounter notes, upcoming appointments, etc.  Non-urgent messages can be sent to your provider as well.   To learn more about what you can do with MyChart, go to [email or web address].    Your next appointment:   6 month(s) @ [address] [place], [place] [address]   The format for your next appointment:   In Person  Provider:   Bridgette Christopher, MD     

## 2020-09-04 DIAGNOSIS — J9611 Chronic respiratory failure with hypoxia: Secondary | ICD-10-CM | POA: Diagnosis not present

## 2020-09-23 DIAGNOSIS — Z03818 Encounter for observation for suspected exposure to other biological agents ruled out: Secondary | ICD-10-CM | POA: Diagnosis not present

## 2020-09-28 DIAGNOSIS — F5109 Other insomnia not due to a substance or known physiological condition: Secondary | ICD-10-CM | POA: Diagnosis not present

## 2020-09-28 DIAGNOSIS — J449 Chronic obstructive pulmonary disease, unspecified: Secondary | ICD-10-CM | POA: Diagnosis not present

## 2020-09-28 DIAGNOSIS — E559 Vitamin D deficiency, unspecified: Secondary | ICD-10-CM | POA: Diagnosis not present

## 2020-09-28 DIAGNOSIS — R69 Illness, unspecified: Secondary | ICD-10-CM | POA: Diagnosis not present

## 2020-09-28 DIAGNOSIS — F064 Anxiety disorder due to known physiological condition: Secondary | ICD-10-CM | POA: Diagnosis not present

## 2020-09-29 DIAGNOSIS — Z03818 Encounter for observation for suspected exposure to other biological agents ruled out: Secondary | ICD-10-CM | POA: Diagnosis not present

## 2020-10-04 DIAGNOSIS — J9611 Chronic respiratory failure with hypoxia: Secondary | ICD-10-CM | POA: Diagnosis not present

## 2020-10-07 DIAGNOSIS — Z03818 Encounter for observation for suspected exposure to other biological agents ruled out: Secondary | ICD-10-CM | POA: Diagnosis not present

## 2020-10-26 DIAGNOSIS — E559 Vitamin D deficiency, unspecified: Secondary | ICD-10-CM | POA: Diagnosis not present

## 2020-10-26 DIAGNOSIS — R69 Illness, unspecified: Secondary | ICD-10-CM | POA: Diagnosis not present

## 2020-10-26 DIAGNOSIS — F5109 Other insomnia not due to a substance or known physiological condition: Secondary | ICD-10-CM | POA: Diagnosis not present

## 2020-10-26 DIAGNOSIS — J449 Chronic obstructive pulmonary disease, unspecified: Secondary | ICD-10-CM | POA: Diagnosis not present

## 2020-11-02 ENCOUNTER — Encounter: Payer: Self-pay | Admitting: Internal Medicine

## 2020-11-02 ENCOUNTER — Other Ambulatory Visit: Payer: Self-pay

## 2020-11-02 ENCOUNTER — Ambulatory Visit: Payer: Medicare HMO | Admitting: Internal Medicine

## 2020-11-02 VITALS — BP 124/76 | HR 85 | Ht 63.0 in | Wt 131.0 lb

## 2020-11-02 DIAGNOSIS — J449 Chronic obstructive pulmonary disease, unspecified: Secondary | ICD-10-CM

## 2020-11-02 DIAGNOSIS — R0902 Hypoxemia: Secondary | ICD-10-CM | POA: Diagnosis not present

## 2020-11-02 MED ORDER — BREZTRI AEROSPHERE 160-9-4.8 MCG/ACT IN AERO: 2.0000 | INHALATION_SPRAY | Freq: Two times a day (BID) | RESPIRATORY_TRACT | 0 refills | Status: DC

## 2020-11-02 NOTE — Assessment & Plan Note (Signed)
Quit smoking 2019 Spirometry 03/03/2015   FEV1  1.04 (44%) ratio 58  - 03/03/2015  Walked RA x 3 laps @ 185 ft each stopped due to  End of study, brisk pace, no sob or desat   -  02/02/2017   Walked RA x one lap @ 185 stopped due to  Back pain/ sob no desats @ nl pace - 02/02/2017  After extensive coaching device effectiveness =    90% with DPI > try trelegy x one month then return for pfts  - Spirometry 05/12/2017  FEV1 0.58 (27%)  Ratio 44  With trelegy prior and no resp to saba  - 05/12/2017   try bevespi instead of trelegy due to throat irritation   - 07/31/2020 ex hypoxemia documented (see separate a/p)  - 11/02/2020  After extensive coaching inhaler device,  effectiveness =    50% still prefers hfa or dpi   Pt is Group B in terms of symptom/risk and laba/lama therefore appropriate rx at this point >>>  bevespi 2bid and prn saba  I spent extra time with pt today reviewing appropriate use of albuterol for prn use on exertion with the following points: 1) saba is for relief of sob that does not improve by walking a slower pace or resting but rather if the pt does not improve after trying this first. 2) If the pt is convinced, as many are, that saba helps recover from activity faster then it's easy to tell if this is the case by re-challenging : ie stop, take the inhaler, then p 5 minutes try the exact same activity (intensity of workload) that just caused the symptoms and see if they are substantially diminished or not after saba 3) if there is an activity that reproducibly causes the symptoms, try the saba 15 min before the activity on alternate days   If in fact the saba really does help, then fine to continue to use it prn but advised may need to look closer at the maintenance regimen being used to achieve better control of airways disease with exertion.

## 2020-11-02 NOTE — Progress Notes (Signed)
Subjective:    Patient ID: Tina Bailey, female    DOB: May 25, 1945,    MRN: [id number]    Brief patient profile: 89 yowf quit smoking 2019  with mostly c/o chronic cough x years attributed to pnds with dx of copd in 2008 but not on resp rx and referred to pulmonary clinic 03/03/2015 by Dr Barry Dienes with GOLD III criteria on spirometry     History of Present Illness  03/03/2015 1st [facility] office visit/ Harrington Jobe   Chief Complaint  Patient presents with   PULMONARY CONSULT    Referred by Dr. Barry Dienes. Pt needs surgical clearance for liver resection tomorrow. Pt was told she has "mild" emphysema. Pt c/o occasional wheeze, cough with creamy thick mucus and some sinus/nasal congestion and drainage. Pt does smoke. Pt does not use inhalers.   cough x years esp in am's but Not limited by breathing from desired activities  Able to shop ok- no hcp Total amt of mucus typically < 2 tsp / white/ not usually bloody or purulent nor is it now rec You have moderately severe copd and need to quit smoking now but it is not prohibitive to surgery on your liver You are taking a risk by smoking up until the day of surgery > ideally you need to stop 2 weeks prior if at all possible to improve cough mechanics/ reduce sputum production  We can use duoneb (breathing treatments) perioperatively if needed     03/04/15  Liver surgery for hepatocellular ca / no chemo/RT but chronic pain since then     02/02/2017  f/u ov/Jenayah Antu re:  GOLD III/ still smoking  Chief Complaint  Patient presents with   Pulmonary Consult    Referred by Dr. Annye Asa for eval of hypoxia. Pt states that she was admitted to the hospital for "a light touch of bronchitis" and CP 12/21/16-12/22/16 and was sent home with o2. She has not used o2 recently b/c she feels it's not needed.   can do [employer] leaning on cart stopping freq = MMRC3 = can't walk 100 yards even at a slow pace at a flat grade s stopping due to sob s  02 No 02 sleeps ok / but sats drop to 86% s 02 when she wakes up  Not using any maintenance  resp medications Uses neb qod on avg rec The key is to stop smoking completely before smoking completely stops you!  Start Trelegy one click  - two good drags first thing in am 02 rec is 2lpm at bedtime and goal for daytime is keep over 90% at all times so as improve probably won't need daytime full time    05/12/2017  f/u ov/Lillie Portner re:  GOLD IV  / has 02 not using  Chief Complaint  Patient presents with   Follow-up    PFT's done today. She states her breathing is back at her normal baseline. She rarely uses her proair.   Dyspnea:  MMRC3 = can't walk 100 yards even at a slow pace at a flat grade s stopping due to sob  Cough: randomly coughs up tsp - tbps slt yellow x few weeks Sleep: fine  Flat most nights Uses neb 3 x weekly but very rarely any proair Feels trelegy powder choking her No longer using 02 and wants it out of her house  rec  The key is to stop smoking completely before smoking completely stops you!  Plan A = Automatic = Bevespi Take  2 puffs first thing in am and then another 2 puffs about 12 hours later.  Work on inhaler technique: Plan B = Backup Only use your albuterol (PROAIR) as a rescue medication  Plan C = Crisis - only use your albuterol nebulizer if you first try Plan B  We will cancel your 02 You cleared for surgery with acceptable risk    07/31/2020  f/u ov/Maelin Kurkowski re:  GOLD IV / ? 02 dep  Chief Complaint  Patient presents with   Consult    Pt states she has been having problems with her breathing and due to this, she has been more fatigued. When pt's O2 sats have been checked recently, her sats are in the 80s and this is on room air. Denies any complaints of cough or wheezing.  Dyspnea:  Hallway with rollator with drop in sats on  Reported x months Cough: no Sleeping: flat /on side / one pillow no no resp SABA use: on bevespi 2bid no real change  02: none  Covid  status:   Never vax but omicron 02/2020  Generally worse since fell and injured chest wall  Rec Make sure you check your oxygen saturation  at your highest level of activity  to be sure it stays over 90% and adjust  02 flow upward   Work on inhaler technique:  Plan A = Automatic = Always=   Besvespi (or Breztri sample) Take 2 puffs first thing in am and then another 2 puffs about 12 hours later  Plan B = Backup (to supplement plan A, not to replace it) Only use your albuterol nebulizer as a rescue medication     11/02/2020  f/u ov/Leasha Goldberger re: GOLD IV on prn 02 and maint on bevespi 2bid  Chief Complaint  Patient presents with   Follow-up  Dyspnea:  not walking as much this summer  Cough: none Sleeping: no resp symptoms flat SABA use: not tried  02: prn Covid status: never vax but had omicron Jan 2022         No obvious day to day or daytime variability or assoc excess/ purulent sputum or mucus plugs or hemoptysis or cp or chest tightness, subjective wheeze or overt sinus or hb symptoms.   Sleeping as above  without nocturnal  or early am exacerbation  of respiratory  c/o's or need for noct saba. Also denies any obvious fluctuation of symptoms with weather or environmental changes or other aggravating or alleviating factors except as outlined above   No unusual exposure hx or h/o childhood pna/ asthma or knowledge of premature birth.  Current Allergies, Complete Past Medical History, Past Surgical History, Family History, and Social History were reviewed in [employer] record.  ROS  The following are not active complaints unless bolded Hoarseness, sore throat, dysphagia, dental problems, itching, sneezing,  nasal congestion or discharge of excess mucus or purulent secretions, ear ache,   fever, chills, sweats, unintended wt loss or wt gain, classically pleuritic or exertional cp,  orthopnea pnd or arm/hand swelling  or leg swelling, presyncope, palpitations, abdominal  pain, anorexia, nausea, vomiting, diarrhea  or change in bowel habits or change in bladder habits, change in stools or change in urine, dysuria, hematuria,  rash, arthralgias, visual complaints, headache, numbness, weakness or ataxia or problems with walking = uses rollator or coordination,  change in mood or  memory.        Current Meds  Medication Sig   acetaminophen (TYLENOL) 325 MG tablet Take  2 tablets (650 mg total) by mouth every 6 (six) hours as needed for mild pain (or Fever >/= 101).   albuterol (PROVENTIL) (2.5 MG/3ML) 0.083% nebulizer solution Take 3 mLs (2.5 mg total) by nebulization every 4 (four) hours as needed for wheezing or shortness of breath.   amLODipine (NORVASC) 2.5 MG tablet Take 2.5 mg by mouth daily.   aspirin-acetaminophen-caffeine (EXCEDRIN MIGRAINE) 250-250-65 MG tablet Take by mouth every 6 (six) hours as needed for headache.   Bismuth Subsalicylate [id number] SUSP Take 30 mLs by mouth every 8 (eight) hours as needed.   busPIRone (BUSPAR) 10 MG tablet Take 20 mg by mouth 3 (three) times daily.   DULoxetine (CYMBALTA) 30 MG capsule Take 1 tablet by mouth daily.   fluticasone (FLONASE) 50 MCG/ACT nasal spray Place 2 sprays into both nostrils daily as needed for allergies or rhinitis.   Glycopyrrolate-Formoterol (BEVESPI AEROSPHERE) 9-4.8 MCG/ACT AERO Inhale into the lungs 2 (two) times daily as needed.   HYDROcodone-acetaminophen (NORCO) 10-325 MG tablet Take 1 tablet by mouth in the morning, at noon, in the evening, and at bedtime.   LORazepam (ATIVAN) 0.5 MG tablet Take 0.5 mg by mouth as needed.   melatonin 5 MG TABS melatonin 5 mg tablet  Take 1 tablet every day by oral route at bedtime.   promethazine (PHENERGAN) 12.5 MG tablet Take 12.5 mg by mouth every 6 (six) hours as needed for nausea or vomiting.                 Objective:   Physical Exam      11/02/2020        131  07/31/20            122  05/12/2017        92   02/02/2017     98   03/03/15  109 lb 6.4 oz (49.624 kg)  02/26/15 110 lb (49.896 kg)  01/13/15 114 lb (51.71 kg)     Vital signs reviewed  11/02/2020  - Note at rest 02 sats  96% on 2lpm    General appearance:    chronically ill elderly amb wf using rollator nad   HEENT : pt wearing mask not removed for exam due to covid -19 concerns.    NECK :  without JVD/Nodes/TM/ nl carotid upstrokes bilaterally   LUNGS: no acc muscle use,  Mod/severe  barrel  contour chest wall with bilateral  Distant end exp wheeze and  without cough on insp or exp maneuvers and mod  Hyperresonant  to  percussion bilaterally     CV:  RRR  no s3 or murmur or increase in P2, and no edema   ABD:  soft and nontender with pos mid insp Hoover's  in the supine position. No bruits or organomegaly appreciated, bowel sounds nl  MS:     ext warm without deformities, calf tenderness, cyanosis or clubbing No obvious joint restrictions   SKIN: warm and dry without lesions    NEURO:  alert, approp, nl sensorium with  no motor or cerebellar deficits apparent.            Assessment & Plan:

## 2020-11-02 NOTE — Assessment & Plan Note (Signed)
07/31/2020  after desat to 85%ra with sob  < 250 ft, pt placed on POC 2lpm pulsed and sats increased to 93% and she walked additional 2 laps = 500 ft with sat at end 91%2lpm pulsed/moderate pace and minimal sob   Advised: Make sure you check your oxygen saturation  at your highest level of activity  to be sure it stays over 90% and adjust  02 flow upward to maintain this level if needed but remember to turn it back to previous settings when you stop (to conserve your supply).   F/u q 3 m, sooner if needed         Each maintenance medication was reviewed in detail including emphasizing most importantly the difference between maintenance and prns and under what circumstances the prns are to be triggered using an action plan format where appropriate.  Total time for H and P, chart review, counseling, reviewing hfa device(s) and generating customized AVS unique to this office visit / same day charting  > 30 min

## 2020-11-02 NOTE — Patient Instructions (Addendum)
Make sure you check your oxygen saturation  at your highest level of activity  to be sure it stays over 90% and adjust  02 flow upward to maintain this level if needed but remember to turn it back to previous settings when you stop (to conserve your supply).   Ok to Try albuterol 15 min before an activity (on alternating days)  that you know would normally make you short of breath and see if it makes any difference and if makes none then don't take albuterol after activity unless you can't catch your breath as this means it's the resting that helps, not the albuterol.  Work on inhaler technique:  relax and gently blow all the way out then take a nice smooth full deep breath back in, triggering the inhaler at same time you start breathing in.   Blow out thru nose. Rinse and gargle with water when done.  If mouth or throat bother you at all,  try brushing teeth/gums/tongue with arm and hammer toothpaste/ make a slurry and gargle and spit out.     Please schedule a follow up visit in 3 months but call sooner if needed

## 2020-11-04 DIAGNOSIS — J9611 Chronic respiratory failure with hypoxia: Secondary | ICD-10-CM | POA: Diagnosis not present

## 2020-12-01 DIAGNOSIS — E559 Vitamin D deficiency, unspecified: Secondary | ICD-10-CM | POA: Diagnosis not present

## 2020-12-01 DIAGNOSIS — G894 Chronic pain syndrome: Secondary | ICD-10-CM | POA: Diagnosis not present

## 2020-12-01 DIAGNOSIS — M546 Pain in thoracic spine: Secondary | ICD-10-CM | POA: Diagnosis not present

## 2020-12-01 DIAGNOSIS — J449 Chronic obstructive pulmonary disease, unspecified: Secondary | ICD-10-CM | POA: Diagnosis not present

## 2020-12-01 DIAGNOSIS — Z5181 Encounter for therapeutic drug level monitoring: Secondary | ICD-10-CM | POA: Diagnosis not present

## 2020-12-01 DIAGNOSIS — Z79899 Other long term (current) drug therapy: Secondary | ICD-10-CM | POA: Diagnosis not present

## 2020-12-01 DIAGNOSIS — Z79891 Long term (current) use of opiate analgesic: Secondary | ICD-10-CM | POA: Diagnosis not present

## 2020-12-01 DIAGNOSIS — R69 Illness, unspecified: Secondary | ICD-10-CM | POA: Diagnosis not present

## 2020-12-03 DIAGNOSIS — K5909 Other constipation: Secondary | ICD-10-CM | POA: Diagnosis not present

## 2020-12-03 DIAGNOSIS — R1084 Generalized abdominal pain: Secondary | ICD-10-CM | POA: Diagnosis not present

## 2020-12-03 DIAGNOSIS — Z8505 Personal history of malignant neoplasm of liver: Secondary | ICD-10-CM | POA: Diagnosis not present

## 2020-12-05 DIAGNOSIS — J9611 Chronic respiratory failure with hypoxia: Secondary | ICD-10-CM | POA: Diagnosis not present

## 2020-12-10 DIAGNOSIS — R1084 Generalized abdominal pain: Secondary | ICD-10-CM | POA: Diagnosis not present

## 2020-12-24 ENCOUNTER — Telehealth: Payer: Self-pay | Admitting: *Deleted

## 2020-12-24 NOTE — Telephone Encounter (Signed)
Patient's daughter called. Per Ms. Dillard, patient had abdominal u/s at [facility] in September and did she still need the one ordered by Ms. Burton. [place] ([place] [phone]) and requested results faxed to [facility]. Medical Records states they will fax results.  Notified patient's daughter that results of Abd U/S 12/04/20 to be faxed to [facility]. Daughter verbalized understanding.  Provider informed

## 2020-12-25 ENCOUNTER — Inpatient Hospital Stay: Payer: Medicare HMO

## 2020-12-28 ENCOUNTER — Inpatient Hospital Stay: Payer: Medicare HMO | Admitting: Nurse Practitioner

## 2020-12-28 DIAGNOSIS — E559 Vitamin D deficiency, unspecified: Secondary | ICD-10-CM | POA: Diagnosis not present

## 2020-12-28 DIAGNOSIS — I270 Primary pulmonary hypertension: Secondary | ICD-10-CM | POA: Diagnosis not present

## 2020-12-28 DIAGNOSIS — J449 Chronic obstructive pulmonary disease, unspecified: Secondary | ICD-10-CM | POA: Diagnosis not present

## 2020-12-28 DIAGNOSIS — R69 Illness, unspecified: Secondary | ICD-10-CM | POA: Diagnosis not present

## 2020-12-28 DIAGNOSIS — Z8616 Personal history of COVID-19: Secondary | ICD-10-CM | POA: Diagnosis not present

## 2020-12-31 ENCOUNTER — Telehealth: Payer: Self-pay | Admitting: Internal Medicine

## 2020-12-31 ENCOUNTER — Telehealth: Payer: Self-pay | Admitting: *Deleted

## 2020-12-31 NOTE — Telephone Encounter (Signed)
Per Cira Rue, NP, called pt daughter to f/u on pt covid symptoms and daughter states she's doing fine, she did have nausea and headache along with fatigue but she said she's better. Just frustrated that she has it. Daughter isn't sure if she's on any antiviral meds. Mrs.Totzke stated they prescribed her something for runny nose and cough but no antiviral medicaton. She said she's mainly just tired. Advised to call office if there are any concerns. Pt and daughter verbalized understanding

## 2020-12-31 NOTE — Telephone Encounter (Signed)
Called pt daughter to make aware of [place] appt for 10/24 at 11am. Vm was left with time, date, and instruction. Advised to called office for questions.

## 2020-12-31 NOTE — Telephone Encounter (Signed)
Spoke with [name] and notified of response per MW  I wrote out order and sent to [facility] [phone]

## 2020-12-31 NOTE — Telephone Encounter (Signed)
Called and spoke with Patient's daughter (DPR) [name].  [name] stated Patient is a resident at [facility].  [name] stated [facility] is charging Patient $985/month to have a nebulizer in her room.  [name] stated facility stated albuterol nebs are scheduled.  [name] stated she needs a order to be sent to [facility] stating Daughter authorized to take neb machine/device home, bring as needed,  and administer as needed.  [name] stated albuterol neb order needs to be sent to [facility] with albuterol prescription instructions for  albuterol every 4 hours as needed.  Message routed to Dr. Melvyn Novas to advise

## 2020-12-31 NOTE — Telephone Encounter (Signed)
Fine with me as long as daughter assumes responsibility of getting nebulier to her quickly prn otherwise the facility may be forced to call 911 for it which would be a whole lot more expensive.

## 2021-01-04 DIAGNOSIS — J9611 Chronic respiratory failure with hypoxia: Secondary | ICD-10-CM | POA: Diagnosis not present

## 2021-01-07 ENCOUNTER — Telehealth: Payer: Self-pay

## 2021-01-07 NOTE — Telephone Encounter (Signed)
This nurse reached out to patient about canceling the [place] and about moving the lab appointment to Friday.  Patient request that this nurse call her daughter about the lab appointment time.  This nurse then reached out to daughter, [name], who agreed to bring patient in on Friday 01/08/21 at 915 am to have her labs drawn.  Reminded her of the  time and date for MRI.  No further questions or concerns at this time.

## 2021-01-07 NOTE — Telephone Encounter (Signed)
-----   Message from Alla Feeling, NP sent at 01/07/2021  2:52 PM EDT ----- This patient doesn't need MRI and [place]. Please move her lab to 10/23 before MRI and cancel 10/24 abdominal US, keep 10/27 f/up. Then let pt know.   Thanks, Regan Rakers

## 2021-01-08 ENCOUNTER — Inpatient Hospital Stay: Payer: Medicare HMO | Attending: Nurse Practitioner

## 2021-01-08 ENCOUNTER — Other Ambulatory Visit: Payer: Self-pay

## 2021-01-08 DIAGNOSIS — C220 Liver cell carcinoma: Secondary | ICD-10-CM | POA: Insufficient documentation

## 2021-01-08 LAB — CBC WITH DIFFERENTIAL/PLATELET
Abs Immature Granulocytes: 0.01 10*3/uL (ref 0.00–0.07)
Basophils Absolute: 0 10*3/uL (ref 0.0–0.1)
Basophils Relative: 1 %
Eosinophils Absolute: 0.1 10*3/uL (ref 0.0–0.5)
Eosinophils Relative: 2 %
HCT: 44.3 % (ref 36.0–46.0)
Hemoglobin: 13.8 g/dL (ref 12.0–15.0)
Immature Granulocytes: 0 %
Lymphocytes Relative: 24 %
Lymphs Abs: 1.5 10*3/uL (ref 0.7–4.0)
MCH: 28.2 pg (ref 26.0–34.0)
MCHC: 31.2 g/dL (ref 30.0–36.0)
MCV: 90.6 fL (ref 80.0–100.0)
Monocytes Absolute: 0.5 10*3/uL (ref 0.1–1.0)
Monocytes Relative: 9 %
Neutro Abs: 4 10*3/uL (ref 1.7–7.7)
Neutrophils Relative %: 64 %
Platelets: 299 10*3/uL (ref 150–400)
RBC: 4.89 MIL/uL (ref 3.87–5.11)
RDW: 12.8 % (ref 11.5–15.5)
WBC: 6.2 10*3/uL (ref 4.0–10.5)
nRBC: 0 % (ref 0.0–0.2)

## 2021-01-08 LAB — COMPREHENSIVE METABOLIC PANEL
ALT: 16 U/L (ref 0–44)
AST: 22 U/L (ref 15–41)
Albumin: 4.2 g/dL (ref 3.5–5.0)
Alkaline Phosphatase: 69 U/L (ref 38–126)
Anion gap: 7 (ref 5–15)
BUN: 13 mg/dL (ref 8–23)
CO2: 30 mmol/L (ref 22–32)
Calcium: 8.8 mg/dL — ABNORMAL LOW (ref 8.9–10.3)
Chloride: 97 mmol/L — ABNORMAL LOW (ref 98–111)
Creatinine, Ser: 0.61 mg/dL (ref 0.44–1.00)
GFR, Estimated: >60 mL/min (ref >60)
Glucose, Bld: 100 mg/dL — ABNORMAL HIGH (ref 70–99)
Potassium: 4.2 mmol/L (ref 3.5–5.1)
Sodium: 134 mmol/L — ABNORMAL LOW (ref 135–145)
Total Bilirubin: 0.7 mg/dL (ref 0.3–1.2)
Total Protein: 7.5 g/dL (ref 6.5–8.1)

## 2021-01-09 LAB — AFP TUMOR MARKER: AFP, Serum, Tumor Marker: 3.2 ng/mL (ref 0.0–9.2)

## 2021-01-10 ENCOUNTER — Ambulatory Visit (HOSPITAL_COMMUNITY)
Admission: RE | Admit: 2021-01-10 | Discharge: 2021-01-10 | Disposition: A | Discharge status: Home / Self Care | Payer: Medicare HMO | Source: Ambulatory Visit | Attending: Nurse Practitioner | Admitting: Nurse Practitioner

## 2021-01-10 DIAGNOSIS — N281 Cyst of kidney, acquired: Secondary | ICD-10-CM | POA: Diagnosis not present

## 2021-01-10 DIAGNOSIS — K862 Cyst of pancreas: Secondary | ICD-10-CM | POA: Diagnosis not present

## 2021-01-10 DIAGNOSIS — C220 Liver cell carcinoma: Secondary | ICD-10-CM | POA: Insufficient documentation

## 2021-01-10 DIAGNOSIS — K769 Liver disease, unspecified: Secondary | ICD-10-CM | POA: Diagnosis not present

## 2021-01-10 DIAGNOSIS — Z8505 Personal history of malignant neoplasm of liver: Secondary | ICD-10-CM | POA: Diagnosis not present

## 2021-01-10 MED ORDER — GADOBUTROL 1 MMOL/ML IV SOLN
6.0000 mL | Freq: Once | INTRAVENOUS | Status: AC | PRN
  Administered 2021-01-10: 6 mL via INTRAVENOUS

## 2021-01-11 ENCOUNTER — Inpatient Hospital Stay: Payer: Medicare HMO

## 2021-01-11 ENCOUNTER — Ambulatory Visit (HOSPITAL_COMMUNITY): Payer: Medicare HMO

## 2021-01-12 ENCOUNTER — Telehealth: Payer: Self-pay

## 2021-01-12 NOTE — Progress Notes (Signed)
[place]   [phone] [phone]   Clinic Follow up Note   Patient Care Team: Roetta Sessions, NP as PCP - General (Nurse Practitioner) Buford Dresser, MD as PCP - Cardiology (Cardiology) Elam Dutch, MD as Consulting Physician (Vascular Surgery) Stark Klein, MD as Consulting Physician (General Surgery) Truitt Merle, MD as Consulting Physician (Hematology) Suella Broad, MD as Consulting Physician (Physical Medicine and Rehabilitation) Valinda Party, MD as Consulting Physician (Rheumatology) Tanda Rockers, MD as Consulting Physician (Pulmonary Disease) Sharia Reeve, MD as Referring Physician (Psychiatry) 01/14/2021   I connected with Tina Bailey on 01/14/21 at 10:23 AM EDT by telephone visit and verified that I am speaking with the correct person using two identifiers.   I discussed the limitations, risks, security and privacy concerns of performing an evaluation and management service by telemedicine and the availability of in-person appointments. I also discussed with the patient that there may be a patient responsible charge related to this service. The patient expressed understanding and agreed to proceed.   Other persons participating in the visit and their role in the encounter: None  Patient's location: Personal residence at [place] location: [place] office  CHIEF COMPLAINT: Follow-up for [place]: Oncology History Overview Note  Hepatocellular carcinoma ([place])   Staging form: Liver (Excluding Intrahepatic Bile Ducts), AJCC 7th Edition     Clinical stage from 12/30/2014: Stage Unknown (T1, NX, M0) - Unsigned     Hepatocellular carcinoma ([place])  12/02/2014 Imaging   Liver MRI showed a large lesion in segment 5 and 40, highly suspicious for malignancy. And a 1.2 cm indeterminate pancreatic body lesion. Mildly enlarged portocaval lymph node.  Cholelithiasis.   12/19/2014 Initial Diagnosis   Hepatocellular carcinoma ([place])   12/19/2014 Initial Biopsy   Liver mass biopsy showed parathyroid carcinoma, positive for hep par 1, cytokeratin 8/18, cytokeratin 7 and MOC-31.   12/25/2014 Imaging   CT chest showed a 3 mm groundglass probably nodule in the right upper lobe, mild right pericardial phrenic lymphadenopathy, no definitive evidence of metastasis.   01/13/2015 Procedure   Liquid embolization of the right portal venous system by Interventional Radiologist Dr. Laurence Ferrari   03/04/2015 Surgery   Liver right lobe hepatectomy, and the portal lymph nodes biopsy   03/04/2015 Pathology Results   Hepatocellular carcinoma, poorly differentiated, spanning 8.5 cm, surgical margins were negative, lymphovascular invasion is identified, for portal lymph nodes were negative.    02/29/2016 Imaging   CT chest w contrast 02/29/2016 IMPRESSION: 1. Status post partial right hepatectomy, without recurrent or metastatic disease. 2. Removal of common duct stent since 06/10/2015. Upper normal to mildly dilated common duct after cholecystectomy. Recommend attention on follow-up. 3. Similar pancreatic head lesion, as detailed on prior MRI. 4. Pelvic floor laxity with cystocele. 5.  Aortic atherosclerosis.   10/14/2016 Imaging   MRI Abdomen W WO Contrast 10/14/16  IMPRESSION: 1. Stable appearance of the liver status post right hepatectomy. No evidence of recurrent tumor or metastatic disease. 2. Suggested slight interval enlargement of known cystic pancreatic mass, likely communicating with the main pancreatic duct and probably reflecting an intraductal papillary mucinous neoplasm. This demonstrates no aggressive characteristics, and continued surveillance suggested.    10/20/2017 Imaging   10/20/2017 MRI Abdomen IMPRESSION: 1. Stable appearance of the liver status post right hepatectomy. No evidence of recurrent tumor or metastatic disease. 2.  Unchanged appearance of cystic pancreatic lesion which appears to communicate with the main pancreatic duct  without main duct dilatation. Findings are compatible with intraductal papillary mucinous neoplasm. Continued interval follow-up is advised. The next follow-up examination should be obtained in 12 months. This recommendation follows ACR consensus guidelines: Management of Incidental Pancreatic Cysts: A White Paper of the ACR Incidental Findings Committee. J Am Coll Radiol [phone].   12/11/2018 Imaging   CT AP w contrast Hepatobiliary: Stable appearance of postsurgical changes of partial liver resection. cholecystectomy. There is dilatation of the common bile duct similar to prior CT.  IMPRESSION: 1. No acute intra-abdominal or pelvic pathology. 2. Sigmoid diverticulosis. No bowel obstruction or active inflammation. Normal appendix.       06/17/2019 Imaging   IMPRESSION: 1. Mild growth of the suspected intraductal papillary mucinous neoplasm of the pancreatic body, now measuring 1.6 by 1.6 by 1.7 cm, previously 1.5 by 1.3 by 1.5 cm by my measurements. However, this does not qualify as formal "interval growth " based on established criteria. Possibilities for follow up at this point include annual surveillance pancreatic protocol CT or MRI, or endoscopic ultrasound/fine-needle aspiration. 2. Bilateral sacral insufficiency fractures. 3. Prior right hepatectomy. No findings of recurrent hepatocellular carcinoma. 4. Sigmoid colon diverticulosis. 5. Multilevel compression fractures of the lower thoracic and lumbar spine with vertebral augmentation at some levels. 6.  Aortic Atherosclerosis (ICD10-I70.0).   01/09/2020 Imaging   Abdominal ultrasound impression Cirrhosis Cholecystectomy Patent main portal vein with hepatopetal flow No ascites   06/22/2020 Imaging   Abdominal ultrasound IMPRESSION: 1. No hepatic mass. 2. Limited visualization of the common bile duct.    01/10/2021 Imaging   ABD MRI IMPRESSION: 1. Stable appearance of right hepatectomy. No findings to suggest recurrent hepatoma. 2. Unchanged cystic lesion along the pancreatic body with mild internal complexity. As mentioned previously this may represent an intraductal papillary mucinous neoplasm. Continued interval surveillance with pancreas protocol CT or MRI is advised. 3. Multilevel compression deformities of the thoracic and lumbar spine with vertebral augmentation at some levels. 4.  Aortic Atherosclerosis (ICD10-I70.0).       CURRENT THERAPY: Surveillance  INTERVAL HISTORY: Tina Bailey presents for virtual follow-up as scheduled, last seen by me 06/25/2020.  She had labs on 01/08/2021 and a recent abdominal MRI.  She moved to a new apartment at [facility], has been there 3 days now.  She has more independence, doing her own meds now but her daughter [name] still remains very involved.  She is doing well in general, has gained some weight.  1 month ago her abdomen felt hard like a "ball", went to new GI at [facility] and was started on Linzess.  Unclear if this is actually helping yet.  Denies jaundice, nausea/vomiting.  She has occasional nerve pain at her previous surgical scars including an old drainage tube site from her liver surgery.  Back pain has improved, still takes Norco 4 times per day, Excedrin, and Cymbalta.  Denies new or worsening pain.  She is now on 2 L supplemental oxygen with activity, not at rest or during sleep.  Denies recent fever, chills, cough, chest pain.  She is working on getting a new PCP, still follows up with pulmonology and cardiology.   MEDICAL HISTORY:  Past Medical History:  Diagnosis Date   Arthritis    DDD. Right shoulder"is frozen"-limited ROM. osteoporosis.   Blue toe syndrome ([place]) 11/27/2014   Dr. Claudia Pollock evaluating   CHF (congestive heart failure) (H[facility])    Complication of anesthesia    COPD (chronic obstructive pulmonary  disease) ([place])  Fibromyalgia    Fracture of rib of right side    hx "osteoporosis"- states her dog nudge her on the side, next day developed great pain and was told has a fracture rib.   Liver cancer ([place]) 12/2014   Macular degeneration    R eye   Osteoporosis    PONV (postoperative nausea and vomiting)    nausea,severe vomiting after 01-13-15 portal vein embolization   Productive cough    Retina disorder    L eye, vision distorted, edema   Spine fracture due to birth trauma    TMJ disease    Wears glasses     SURGICAL HISTORY: Past Surgical History:  Procedure Laterality Date   ANGIOPLASTY  2008   no stents required, no follow-up with cardiologist, no recurrent chest pain   CATARACT EXTRACTION, BILATERAL Bilateral    ESOPHAGOGASTRODUODENOSCOPY (EGD) WITH PROPOFOL N/A 06/25/2015   Procedure: ESOPHAGOGASTRODUODENOSCOPY (EGD) WITH PROPOFOL;  Surgeon: Milus Banister, MD;  Location: [facility];  Service: Endoscopy;  Laterality: N/A;  stent removal    EYE SURGERY     cornea surgery   INTRAMEDULLARY (IM) NAIL INTERTROCHANTERIC Right 12/04/2017   Procedure: INTRAMEDULLARY (IM) NAIL INTERTROCHANTRIC;  Surgeon: Rod Can, MD;  Location: [facility];  Service: Orthopedics;  Laterality: Right;   IR RADIOLOGIST EVAL & MGMT  03/29/2017   KIDNEY STONE SURGERY     LAPAROSCOPIC PARTIAL HEPATECTOMY N/A 03/04/2015   Procedure: DIAGNOSTIC LAPAROSCOPY, EXTENDED RIGHT HEPATECTOMY, WITH INTRAOPERATIVE ULTRASOUND;  Surgeon: Stark Klein, MD;  Location: [facility];  Service: General;  Laterality: N/A;   PARTIAL HYSTERECTOMY     portal vein embolization     01-13-15 -[facility].    I have reviewed the social history and family history with the patient and they are unchanged from previous note.  ALLERGIES:  is allergic to zoloft [sertraline hcl], amoxicillin-pot clavulanate, clindamycin/lincomycin, paxil [paroxetine hcl], paroxetine hcl, and sulfa antibiotics.  MEDICATIONS:  Current  Outpatient Medications  Medication Sig Dispense Refill   acetaminophen (TYLENOL) 325 MG tablet Take 2 tablets (650 mg total) by mouth every 6 (six) hours as needed for mild pain (or Fever >/= 101).     albuterol (PROVENTIL) (2.5 MG/3ML) 0.083% nebulizer solution Take 3 mLs (2.5 mg total) by nebulization every 4 (four) hours as needed for wheezing or shortness of breath. 75 mL 12   amLODipine (NORVASC) 2.5 MG tablet Take 2.5 mg by mouth daily.     aspirin-acetaminophen-caffeine (EXCEDRIN MIGRAINE) 250-250-65 MG tablet Take by mouth every 6 (six) hours as needed for headache.     Budeson-Glycopyrrol-Formoterol (BREZTRI AEROSPHERE) 160-9-4.8 MCG/ACT AERO Inhale 2 puffs into the lungs in the morning and at bedtime. 5.9 g 0   busPIRone (BUSPAR) 10 MG tablet Take 20 mg by mouth 3 (three) times daily.     DULoxetine (CYMBALTA) 30 MG capsule Take 1 tablet by mouth daily.     fluticasone (FLONASE) 50 MCG/ACT nasal spray Place 2 sprays into both nostrils daily as needed for allergies or rhinitis.     Glycopyrrolate-Formoterol (BEVESPI AEROSPHERE) 9-4.8 MCG/ACT AERO Inhale into the lungs 2 (two) times daily as needed.     HYDROcodone-acetaminophen (NORCO) 10-325 MG tablet Take 1 tablet by mouth in the morning, at noon, in the evening, and at bedtime.     LINZESS 72 MCG capsule Take 72 mcg by mouth every morning.     LORazepam (ATIVAN) 0.5 MG tablet Take 0.5 mg by mouth as needed.     melatonin 5 MG  TABS melatonin 5 mg tablet  Take 1 tablet every day by oral route at bedtime.     promethazine (PHENERGAN) 12.5 MG tablet Take 12.5 mg by mouth every 6 (six) hours as needed for nausea or vomiting.     Bismuth Subsalicylate 696 VE/93YB SUSP Take 30 mLs by mouth every 8 (eight) hours as needed.     No current facility-administered medications for this visit.    PHYSICAL EXAMINATION: ECOG PERFORMANCE STATUS: 1 - Symptomatic but completely ambulatory  There were no vitals filed for this visit. There were no  vitals filed for this visit.  Patient appears well over the phone, mood/affect appear appropriate.  Speech is clear, voice is strong.  No cough or conversational dyspnea  LABORATORY DATA:  I have reviewed the data as listed CBC Latest Ref Rng & Units 01/08/2021 06/25/2020 12/25/2019  WBC 4.0 - 10.5 K/uL 6.2 6.5 5.9  Hemoglobin 12.0 - 15.0 g/dL 13.8 14.3 14.6  Hematocrit 36.0 - 46.0 % 44.3 45.2 46.3(H)  Platelets 150 - 400 K/uL 299 218 212     CMP Latest Ref Rng & Units 01/08/2021 06/25/2020 12/25/2019  Glucose 70 - 99 mg/dL 100(H) 98 99  BUN 8 - 23 mg/dL 13 12 15   Creatinine 0.44 - 1.00 mg/dL 0.61 0.68 0.70  Sodium 135 - 145 mmol/L 134(L) 134(L) 135  Potassium 3.5 - 5.1 mmol/L 4.2 4.4 4.2  Chloride 98 - 111 mmol/L 97(L) 94(L) 94(L)  CO2 22 - 32 mmol/L 30 32 37(H)  Calcium 8.9 - 10.3 mg/dL 8.8(L) 8.6(L) 9.2  Total Protein 6.5 - 8.1 g/dL 7.5 7.1 7.1  Total Bilirubin 0.3 - 1.2 mg/dL 0.7 0.4 0.6  Alkaline Phos 38 - 126 U/L 69 75 75  AST 15 - 41 U/L 22 18 16   ALT 0 - 44 U/L 16 10 11       RADIOGRAPHIC STUDIES: I have personally reviewed the radiological images as listed and agreed with the findings in the report. No results found.   ASSESSMENT & PLAN: Tina Bailey is a 75 y.o. female with history of   1. Hepatocellular carcinoma, pT3bN0M0, stage IIIB, poorly differentiated  -Diagnosed in 11/2014. Treated with embolization and right hepatectomy. Currently on surveillance. -serial imaging showed no evidence of recurrence  -MRI abdomen 01/10/2021 showed no evidence of recurrent or metastatic disease -AFP is normal -She is over 5 years from initial diagnosis, the recurrence risk has decreased. -Imaging as clinically indicated at this point   2. Pancreatic cystic lesion -Relatively stable over many years -Seen by Dr. Ardis Hughs 08/23/2019 who discussed repeating EUS with FNA, he felt to be complex given her extreme kyphosis and respiratory issues and it was not recommended -He also  discussed it would be reasonable to stop surveillance versus repeat MRI in 2 years -Stable on MRI 01/10/2021   3. COPD and smoking cessation  -f/u with PCP and Dr. Melvyn Novas -Quit smoking completely early 2020 -She has started using supplemental oxygen with activity -Follow-up Dr. Melvyn Novas 01/2021  4. Chronic compression fracture and multiple fractures and body pain  -She has had multiple fractures in her spine, sternum, and the right hip -f/u ortho, Dr. Idamae Schuller -Stable appearance on recent MRI -Pain has improved   5. Osteoporosis -Stopped FORTEO in 2018 because she didn't want injections -Currently on multivitamins and Vitamin d. Continue. -severe osteopenia noted on thoracic and lumbar CTs on 12/11/18.    6.  Anxiety and depression -On BuSpar and Cymbalta, Ativan as needed, and Norco for chronic  pain -I reviewed medication policy to avoid Ativan and Norco simultaneously  Disposition: Tina Bailey appears stable.  Recent labs are unremarkable, AFP is normal.  I reviewed her surveillance MRI abdomen from 01/10/2021 which shows no evidence of recurrent or metastatic HCC, and unchanged pancreatic cyst.   She is over 5 years from initial [place] diagnosis, the recurrence risk has decreased. I encouraged her to watch for increased abdominal pain/bloating, signs of jaundice, nausea/vomiting, unintentional weight loss, or other concerning changes.  Continue follow-up with medical team.  Patient will inform her daughter [name] of the details from our visit, I encouraged her to let her call me if she has any questions.  Lab and follow-up in 6 months, or sooner if needed.  I discussed the assessment and treatment plan with the patient. The patient was provided an opportunity to ask questions and all were answered. The patient agreed with the plan and demonstrated an understanding of the instructions.   The patient was advised to call back or seek an in-person evaluation if the symptoms worsen or if the  condition fails to improve as anticipated. Total non-face-to-face time in this encounter was 30 minutes.     Alla Feeling, NP 01/14/21

## 2021-01-12 NOTE — Telephone Encounter (Signed)
Pt is wanting to come back to you as a patient? At the time of Covid you recommended that she start seeing the facility in house Dr and not she is no longer with [facility] Dr Clide Deutscher was the facility Dr seeing her.   Call back [place] Daughter

## 2021-01-14 ENCOUNTER — Inpatient Hospital Stay (HOSPITAL_BASED_OUTPATIENT_CLINIC_OR_DEPARTMENT_OTHER): Payer: Medicare HMO | Admitting: Nurse Practitioner

## 2021-01-14 ENCOUNTER — Encounter: Payer: Self-pay | Admitting: Nurse Practitioner

## 2021-01-14 DIAGNOSIS — C220 Liver cell carcinoma: Secondary | ICD-10-CM

## 2021-01-19 DIAGNOSIS — K5909 Other constipation: Secondary | ICD-10-CM | POA: Diagnosis not present

## 2021-01-19 NOTE — Telephone Encounter (Signed)
Dr Birdie Riddle can you let me know if you will take this patient her daughter is calling back

## 2021-01-21 ENCOUNTER — Telehealth: Payer: Self-pay

## 2021-01-21 NOTE — Telephone Encounter (Signed)
Unfortunately I cannot take her back at this time due to full schedule and the fear she would not get the care needed in a timely manner

## 2021-01-21 NOTE — Telephone Encounter (Signed)
Error

## 2021-01-21 NOTE — Telephone Encounter (Signed)
Follow up.

## 2021-02-02 ENCOUNTER — Ambulatory Visit: Payer: Medicare HMO | Admitting: Internal Medicine

## 2021-02-04 DIAGNOSIS — J9611 Chronic respiratory failure with hypoxia: Secondary | ICD-10-CM | POA: Diagnosis not present

## 2021-02-08 DIAGNOSIS — M25562 Pain in left knee: Secondary | ICD-10-CM | POA: Diagnosis not present

## 2021-02-08 DIAGNOSIS — G894 Chronic pain syndrome: Secondary | ICD-10-CM | POA: Diagnosis not present

## 2021-02-08 DIAGNOSIS — M79671 Pain in right foot: Secondary | ICD-10-CM | POA: Diagnosis not present

## 2021-02-08 DIAGNOSIS — M4854XD Collapsed vertebra, not elsewhere classified, thoracic region, subsequent encounter for fracture with routine healing: Secondary | ICD-10-CM | POA: Diagnosis not present

## 2021-02-14 ENCOUNTER — Encounter: Payer: Self-pay | Admitting: Cardiology

## 2021-02-16 DIAGNOSIS — M722 Plantar fascial fibromatosis: Secondary | ICD-10-CM | POA: Diagnosis not present

## 2021-02-16 DIAGNOSIS — S93601A Unspecified sprain of right foot, initial encounter: Secondary | ICD-10-CM | POA: Diagnosis not present

## 2021-02-18 ENCOUNTER — Ambulatory Visit (INDEPENDENT_AMBULATORY_CARE_PROVIDER_SITE_OTHER): Payer: Medicare HMO | Admitting: Family Medicine

## 2021-02-18 ENCOUNTER — Encounter: Payer: Self-pay | Admitting: Family Medicine

## 2021-02-18 VITALS — BP 138/76 | HR 78 | Temp 98.3°F | Resp 16 | Ht 63.0 in | Wt 130.0 lb

## 2021-02-18 DIAGNOSIS — I272 Pulmonary hypertension, unspecified: Secondary | ICD-10-CM

## 2021-02-18 DIAGNOSIS — R69 Illness, unspecified: Secondary | ICD-10-CM | POA: Diagnosis not present

## 2021-02-18 DIAGNOSIS — F418 Other specified anxiety disorders: Secondary | ICD-10-CM

## 2021-02-18 DIAGNOSIS — F1190 Opioid use, unspecified, uncomplicated: Secondary | ICD-10-CM | POA: Diagnosis not present

## 2021-02-18 DIAGNOSIS — Z87898 Personal history of other specified conditions: Secondary | ICD-10-CM

## 2021-02-18 DIAGNOSIS — J438 Other emphysema: Secondary | ICD-10-CM

## 2021-02-18 DIAGNOSIS — I5032 Chronic diastolic (congestive) heart failure: Secondary | ICD-10-CM

## 2021-02-18 DIAGNOSIS — C220 Liver cell carcinoma: Secondary | ICD-10-CM | POA: Diagnosis not present

## 2021-02-18 MED ORDER — BUSPIRONE HCL 10 MG PO TABS: 20.0000 mg | ORAL_TABLET | Freq: Three times a day (TID) | ORAL | 5 refills | Status: DC

## 2021-02-18 NOTE — Patient Instructions (Addendum)
Follow up in next 2 months to discuss the lorazepam and anxiety further.   No med changes for now - let me know if other refills needed prior to next visit. Nice meeting you!

## 2021-02-18 NOTE — Progress Notes (Signed)
Subjective:  Patient ID: Tina Bailey, female    DOB: [birth year]  Age: 75 y.o. MRN: [id number]  CC:  Chief Complaint  Patient presents with   New Patient (Initial Visit)    Pt here to establish care, no need for refills at this time will in future     HPI Tina Bailey presents for   Here with daughter [name].  New patient to establish care.  Previous primary provider Clide Deutscher, NP at [facility]. She rx lorazepam, amlodipine and buspirone.  Now living at [facility] since 10/22. independent living, but access to in home health care if needed.  Patient Care Team: Buford Dresser, MD as PCP - Cardiology (Cardiology) Elam Dutch, MD as Consulting Physician (Vascular Surgery) Stark Klein, MD as Consulting Physician (General Surgery) Truitt Merle, MD as Consulting Physician (Hematology) Suella Broad, MD as Consulting Physician (Physical Medicine and Rehabilitation) Valinda Party, MD as Consulting Physician (Rheumatology) Tanda Rockers, MD as Consulting Physician (Pulmonary Disease) Sharia Reeve, MD as Referring Physician (Psychiatry)  Oncology: Hepatocellular carcinoma diagnosed in September 2016.  Currently on surveillance.  Status post embolization and partial liver resection/right hepatectomy in December 2016.  AFP normal, MRI abdomen October 23 without evidence of recurrent or metastatic disease.  5 years out from initial diagnosis.  Recurrence risk decreased.  Plan for imaging as clinically indicated.  She did have a pancreatic cystic lesion that had been stable over many years.  Seen previously by Dr. Ardis Hughs in June 2021 with plan for possible repeat EUS with FNA but complex given her extreme kyphosis and respiratory issues, not recommended.  Stable on MRI 01/10/2021.  COPD Treated with Bevespri 9/4.8 mcg inhaler.Quit smoking in 2019, 45-pack-year history. Followed by pulmonary, Dr. Melvyn Novas.  On supplemental oxygen with  activity. 2 liters.   Cardiac History of chronic diastolic heart failure, thoracic aortic aneurysm, pulmonary hypertension, peripheral edema per problem list. Cardiologist Dr. Harrell Gave. On 2.5mg  amlodipine (1/2 of 5mg ).   Depression, anxiety, chronic pain syndrome Treated with Cymbalta 30 mg daily, buspirone 10 mg 3 times daily.  Has been prescribed lorazepam to use as needed - takes lorazepam only if more anxious 1-2 times per week. Not daily use. Feels like anxiety overall stable on current doses.   Chronic narcotic use with hydrocodone 10/325 mg.  Multiple compression fractures and fractures and sternum, right hip.  Followed by orthopedics, Dr. Nelva Bush, last Rx hydrocodone 11/17. Has been advised to not combine narcotic, benzo.  History of osteoporosis but stopped Forteo in 2018.  On MVI, vit D.  Depression screen [facility] 2/9 02/18/2021 09/26/2018 04/16/2018 04/02/2018 10/18/2017  Decreased Interest 1 0 0 0 1  Down, Depressed, Hopeless 0 0 0 0 1  PHQ - 2 Score 1 0 0 0 2  Altered sleeping 0 0 - 0 0  Tired, decreased energy 1 0 - 0 2  Change in appetite 0 0 - 0 1  Feeling bad or failure about yourself  0 0 - 0 1  Trouble concentrating 1 0 - 0 1  Moving slowly or fidgety/restless 0 0 - 0 2  Suicidal thoughts 0 0 - 0 0  PHQ-9 Score 3 0 - 0 9  Difficult doing work/chores - Not difficult at all - Not difficult at all Somewhat difficult  Some recent data might be hidden    Covid vaccine - declines.  Had covid infection twice - most recently in August.  Declines flu vaccine.   History Patient  Active Problem List   Diagnosis Date Noted   Exercise hypoxemia 08/01/2020   Personal history of COVID-19 04/29/2020   History of peripheral edema 01/14/2019   Skin picking habit 12/02/2018   Thoracic aortic aneurysm without rupture 04/13/2018   Pulmonary hypertension, unspecified ([place]) 04/13/2018   Bilateral leg edema 04/13/2018   Severe episode of recurrent major depressive disorder, without  psychotic features ([place]) 04/02/2018   Chronic diastolic heart failure (HCC)    Hyponatremia 02/28/2018   COPD (chronic obstructive pulmonary disease) ([place]) 02/28/2018   Tobacco abuse 02/28/2018   Acute blood loss anemia 12/06/2017   Thrombocytopenia ([place]) 12/06/2017   Severe protein-calorie malnutrition ([place]) 12/05/2017   Closed comminuted intertrochanteric fracture of proximal end of right femur ([place]) 12/04/2017   Chronic thoracic back pain 03/31/2017   Physical exam 01/09/2017   Malnutrition of moderate degree 12/22/2016   Anxiety about health 09/27/2016   Osteoporosis 07/13/2016   Chronic narcotic use 07/13/2016   Constipation 04/26/2015   Chronic pain 04/26/2015   Depression with anxiety 04/26/2015   COPD GOLD IV /  02 dep with exertion 03/03/2015   Hepatocellular carcinoma ([place]) 12/30/2014   Pancreatic mass 12/04/2014   Blue toe syndrome ([place]) 11/27/2014   Past Medical History:  Diagnosis Date   Arthritis    DDD. Right shoulder"is frozen"-limited ROM. osteoporosis.   Blue toe syndrome ([place]) 11/27/2014   Dr. Claudia Pollock evaluating   CHF (congestive heart failure) (HCC)    Complication of anesthesia    COPD (chronic obstructive pulmonary disease) ([place])    Fibromyalgia    Fracture of rib of right side    hx "osteoporosis"- states her dog nudge her on the side, next day developed great pain and was told has a fracture rib.   Liver cancer ([place]) 12/2014   Macular degeneration    R eye   Osteoporosis    PONV (postoperative nausea and vomiting)    nausea,severe vomiting after 01-13-15 portal vein embolization   Productive cough    Retina disorder    L eye, vision distorted, edema   Spine fracture due to birth trauma    TMJ disease    Wears glasses    Past Surgical History:  Procedure Laterality Date   ANGIOPLASTY  2008   no stents required, no follow-up with cardiologist, no recurrent chest pain   CATARACT EXTRACTION, BILATERAL Bilateral    ESOPHAGOGASTRODUODENOSCOPY  (EGD) WITH PROPOFOL N/A 06/25/2015   Procedure: ESOPHAGOGASTRODUODENOSCOPY (EGD) WITH PROPOFOL;  Surgeon: Milus Banister, MD;  Location: [facility];  Service: Endoscopy;  Laterality: N/A;  stent removal    EYE SURGERY     cornea surgery   INTRAMEDULLARY (IM) NAIL INTERTROCHANTERIC Right 12/04/2017   Procedure: INTRAMEDULLARY (IM) NAIL INTERTROCHANTRIC;  Surgeon: Rod Can, MD;  Location: [facility]S;  Service: Orthopedics;  Laterality: Right;   IR RADIOLOGIST EVAL & MGMT  03/29/2017   KIDNEY STONE SURGERY     LAPAROSCOPIC PARTIAL HEPATECTOMY N/A 03/04/2015   Procedure: DIAGNOSTIC LAPAROSCOPY, EXTENDED RIGHT HEPATECTOMY, WITH INTRAOPERATIVE ULTRASOUND;  Surgeon: Stark Klein, MD;  Location: [facility] ORS;  Service: General;  Laterality: N/A;   PARTIAL HYSTERECTOMY     portal vein embolization     01-13-15 -Dr. Cathlean Sauer.   Allergies  Allergen Reactions   Zoloft [Sertraline Hcl]     Zoloft, had a terrible reaction   Amoxicillin-Pot Clavulanate Nausea Only    Amoxacillin Augmentin combo  Has patient had a PCN reaction causing immediate rash, facial/tongue/throat swelling, SOB or lightheadedness with hypotension:  No Has patient had a PCN reaction causing severe rash involving mucus membranes or skin necrosis: No Has patient had a PCN reaction that required hospitalization No Has patient had a PCN reaction occurring within the last 10 years: No If all of the above answers are "NO", then may proceed with Cephalosporin use.    Clindamycin/Lincomycin Other (See Comments)    Felt like it "burned out" her stomach/took a large dose   Paxil [Paroxetine Hcl]    Paroxetine Hcl Rash   Sulfa Antibiotics Rash   Prior to Admission medications   Medication Sig Start Date End Date Taking? Authorizing Provider  acetaminophen (TYLENOL) 325 MG tablet Take 2 tablets (650 mg total) by mouth every 6 (six) hours as needed for mild pain (or Fever >/= 101). 03/06/18  Yes Florencia Reasons, MD  albuterol (PROVENTIL)  (2.5 MG/3ML) 0.083% nebulizer solution Take 3 mLs (2.5 mg total) by nebulization every 4 (four) hours as needed for wheezing or shortness of breath. 07/31/20  Yes Tanda Rockers, MD  amLODipine (NORVASC) 2.5 MG tablet Take 2.5 mg by mouth daily. 07/23/20  Yes [provider]  aspirin-acetaminophen-caffeine (EXCEDRIN MIGRAINE) [phone] MG tablet Take by mouth every 6 (six) hours as needed for headache.   Yes [provider]  Budeson-Glycopyrrol-Formoterol (BREZTRI AEROSPHERE) 160-9-4.8 MCG/ACT AERO Inhale 2 puffs into the lungs in the morning and at bedtime. 11/02/20  Yes Tanda Rockers, MD  busPIRone (BUSPAR) 10 MG tablet Take 20 mg by mouth 3 (three) times daily.   Yes [provider]  docusate sodium (COLACE) 100 MG capsule Take 100 mg by mouth 2 (two) times daily.   Yes [provider]  DULoxetine (CYMBALTA) 30 MG capsule Take 1 tablet by mouth daily.   Yes [provider]  HYDROcodone-acetaminophen (NORCO) 10-325 MG tablet Take 1 tablet by mouth in the morning, at noon, in the evening, and at bedtime.   Yes [provider]  LINZESS 72 MCG capsule Take 72 mcg by mouth every morning. 01/13/21  Yes [provider]  LORazepam (ATIVAN) 0.5 MG tablet Take 0.5 mg by mouth as needed. 05/20/19  Yes [provider]  melatonin 5 MG TABS melatonin 5 mg tablet  Take 1 tablet every day by oral route at bedtime.   Yes [provider]  promethazine (PHENERGAN) 12.5 MG tablet Take 12.5 mg by mouth every 6 (six) hours as needed for nausea or vomiting.   Yes [provider]  Bismuth Subsalicylate 630 ZS/01UX SUSP Take 30 mLs by mouth every 8 (eight) hours as needed. Patient not taking: Reported on 02/18/2021    [provider]  fluticasone (FLONASE) 50 MCG/ACT nasal spray Place 2 sprays into both nostrils daily as needed for allergies or rhinitis. Patient not taking: Reported on 02/18/2021    [provider]   Glycopyrrolate-Formoterol (BEVESPI AEROSPHERE) 9-4.8 MCG/ACT AERO Inhale into the lungs 2 (two) times daily as needed. Patient not taking: Reported on 02/18/2021    [provider]   Social History   Socioeconomic History   Marital status: Divorced    Spouse name: Not on file   Number of children: 3   Years of education: Not on file   Highest education level: Not on file  Occupational History   Occupation: retired  Tobacco Use   Smoking status: Former    Packs/day: 1.00    Years: 45.00    Pack years: 45.00    Types: Cigarettes    Quit date: 02/20/2018  Years since quitting: 2.9   Smokeless tobacco: Never  Vaping Use   Vaping Use: Never used  Substance and Sexual Activity   Alcohol use: No    Alcohol/week: 0.0 standard drinks   Drug use: No   Sexual activity: Not Currently  Other Topics Concern   Not on file  Social History Narrative   Not on file   Social Determinants of Health   Financial Resource Strain: Not on file  Food Insecurity: Not on file  Transportation Needs: Not on file  Physical Activity: Not on file  Stress: Not on file  Social Connections: Not on file  Intimate Partner Violence: Not on file    Review of Systems Per HPI.   Objective:   Vitals:   02/18/21 1110  BP: 138/76  Pulse: 78  Resp: 16  Temp: 98.3 F (36.8 C)  TempSrc: Temporal  SpO2: 92%  Weight: 130 lb (59 kg)  Height: 5\' 3"  (1.6 m)     Physical Exam Vitals reviewed.  Constitutional:      General: She is not in acute distress.    Appearance: Normal appearance. She is well-developed. She is not ill-appearing or diaphoretic.     Comments: Using RW.   HENT:     Head: Normocephalic and atraumatic.  Eyes:     Conjunctiva/sclera: Conjunctivae normal.     Pupils: Pupils are equal, round, and reactive to light.  Neck:     Vascular: No carotid bruit.  Cardiovascular:     Rate and Rhythm: Normal rate and regular rhythm.     Heart sounds: Normal heart sounds.   Pulmonary:     Effort: Pulmonary effort is normal.     Breath sounds: Normal breath sounds.  Abdominal:     Palpations: Abdomen is soft. There is no pulsatile mass.     Tenderness: There is no abdominal tenderness.  Musculoskeletal:     Right lower leg: No edema.     Left lower leg: No edema.  Skin:    General: Skin is warm and dry.  Neurological:     Mental Status: She is alert and oriented to person, place, and time.  Psychiatric:        Mood and Affect: Mood normal.        Behavior: Behavior normal.       Assessment & Plan:  Tina Bailey is a 75 y.o. female . Depression with anxiety - Plan: busPIRone (BUSPAR) 10 MG tablet  -Currently on Cymbalta prescribed by physical medicine and rehab specialist, as well as buspirone for anxiety.  Symptoms overall stable.  Rare need for benzodiazepine.  We will continue same regimen for now with follow-up next few months to look into other options instead of benzodiazepine.  She is aware to not combine that with narcotic pain medicine due to increased risk of accidental overdose.  History of peripheral edema  -Appears euvolemic, no sign of fluid overload at this time.  Pulmonary hypertension, unspecified ([place]) Chronic diastolic heart failure ([place])  -Continue follow-up with cardiology.  Euvolemic on exam.  Other emphysema ([place])  -With use of oxygen with activity.  Stable.  Continue follow-up as planned with pulmonary.  Chronic narcotic use  -With various fractures as above, followed by physical medicine and rehab.  Continue ongoing care.  Hepatocellular carcinoma ([place])  -Most recent AFP, imaging reassuring and now 5 years out from treatment.  Continue follow-up with oncology.  Meds ordered this encounter  Medications   busPIRone (BUSPAR) 10 MG tablet  Sig: Take 2 tablets (20 mg total) by mouth 3 (three) times daily.    Dispense:  90 tablet    Refill:  5   Patient Instructions  Follow up in next 2 months to discuss the  lorazepam and anxiety further.   No med changes for now - let me know if other refills needed prior to next visit. Nice meeting you!     Signed,   Merri Ray, MD [place], [facility] 02/18/21 7:16 PM

## 2021-02-24 ENCOUNTER — Telehealth: Payer: Self-pay | Admitting: Cardiology

## 2021-02-24 NOTE — Telephone Encounter (Signed)
Spoke to pt's daughter. She report for the past few weeks pt has c/o feeling very drained. She denies any other symptoms such as lightheadedness, SOB, swelling, fever, or CP but state she is just so tired. She report once she take her scheduled medication, she will perk up for about an hour but then feel wiped out again. Recent BP and HR listed below.   11/29 -130/78 HR 81 12/1-168/107 12/2- HR 86 12/3- 138/78 HR 89  Pt has an appointment already scheduled for 12/12. Will forward to MD for further recommendations.

## 2021-02-24 NOTE — Telephone Encounter (Signed)
Patient's daughter called stating patient is having no energy for the last few weeks, she seems in a fog, is always tired.  Her BP this morning was 160/100, daughter states her BP runs anywhere in the 120-160, her oxygen runs in the low 90's. Daughter is concerned her mother is feeling this way, she wants to know what she should do.  Patient does have an appt with Dr. Harrell Gave on 12/12.

## 2021-02-25 ENCOUNTER — Ambulatory Visit: Payer: Medicare HMO | Admitting: Internal Medicine

## 2021-02-25 ENCOUNTER — Other Ambulatory Visit: Payer: Self-pay

## 2021-02-25 ENCOUNTER — Encounter: Payer: Self-pay | Admitting: Internal Medicine

## 2021-02-25 DIAGNOSIS — R0902 Hypoxemia: Secondary | ICD-10-CM

## 2021-02-25 DIAGNOSIS — J449 Chronic obstructive pulmonary disease, unspecified: Secondary | ICD-10-CM

## 2021-02-25 MED ORDER — ALBUTEROL SULFATE (2.5 MG/3ML) 0.083% IN NEBU: 2.5000 mg | INHALATION_SOLUTION | RESPIRATORY_TRACT | 12 refills | Status: DC | PRN

## 2021-02-25 NOTE — Patient Instructions (Signed)
Plan A = Automatic = Always=   Breztri Take 2 puffs first thing in am and then another 2 puffs about 12 hours later.    Work on inhaler technique:  relax and gently blow all the way out then take a nice smooth full deep breath back in, triggering the inhaler at same time you start breathing in.  Hold for up to 5 seconds if you can. Blow out thru nose. Rinse and gargle with water when done.  If mouth or throat bother you at all,  try brushing teeth/gums/tongue with arm and hammer toothpaste/ make a slurry and gargle and spit out.       Plan B = Backup (to supplement plan A, not to replace it) Only use your albuterol nebulizer as a rescue medication to be used if you can't catch your breath by resting or doing a relaxed purse lip breathing pattern.  - The less you use it, the better it will work when you need it. - Ok to use the inhaler up to  very 4 hours if you must but call for appointment if use goes up over your usual need   Please schedule a follow up visit in 6 months but call sooner if needed

## 2021-02-25 NOTE — Progress Notes (Signed)
Subjective:    Patient ID: Tina Bailey, female    DOB: 08/12/1945,    MRN: [id number]    Brief patient profile: 58 yowf quit smoking 2019  with mostly c/o chronic cough x years attributed to pnds with dx of copd in 2008 but not on resp rx and referred to pulmonary clinic 03/03/2015 by Dr Barry Dienes with GOLD III criteria on spirometry     History of Present Illness  03/03/2015 1st [facility] office visit/ Tina Bailey   Chief Complaint  Patient presents with   PULMONARY CONSULT    Referred by Dr. Barry Dienes. Pt needs surgical clearance for liver resection tomorrow. Pt was told she has "mild" emphysema. Pt c/o occasional wheeze, cough with creamy thick mucus and some sinus/nasal congestion and drainage. Pt does smoke. Pt does not use inhalers.   cough x years esp in am's but Not limited by breathing from desired activities  Able to shop ok- no hcp Total amt of mucus typically < 2 tsp / white/ not usually bloody or purulent nor is it now rec You have moderately severe copd and need to quit smoking now but it is not prohibitive to surgery on your liver You are taking a risk by smoking up until the day of surgery > ideally you need to stop 2 weeks prior if at all possible to improve cough mechanics/ reduce sputum production  We can use duoneb (breathing treatments) perioperatively if needed     03/04/15  Liver surgery for hepatocellular ca / no chemo/RT but chronic pain since then     02/02/2017  f/u ov/Tina Bailey re:  GOLD III/ still smoking  Chief Complaint  Patient presents with   Pulmonary Consult    Referred by Dr. Annye Asa for eval of hypoxia. Pt states that she was admitted to the hospital for "a light touch of bronchitis" and CP 12/21/16-12/22/16 and was sent home with o2. She has not used o2 recently b/c she feels it's not needed.   can do [employer] leaning on cart stopping freq = MMRC3 = can't walk 100 yards even at a slow pace at a flat grade s stopping due to sob s  02 No 02 sleeps ok / but sats drop to 86% s 02 when she wakes up  Not using any maintenance  resp medications Uses neb qod on avg rec The key is to stop smoking completely before smoking completely stops you!  Start Trelegy one click  - two good drags first thing in am 02 rec is 2lpm at bedtime and goal for daytime is keep over 90% at all times so as improve probably won't need daytime full time    05/12/2017  f/u ov/Tina Bailey re:  GOLD IV  / has 02 not using  Chief Complaint  Patient presents with   Follow-up    PFT's done today. She states her breathing is back at her normal baseline. She rarely uses her proair.   Dyspnea:  MMRC3 = can't walk 100 yards even at a slow pace at a flat grade s stopping due to sob  Cough: randomly coughs up tsp - tbps slt yellow x few weeks Sleep: fine  Flat most nights Uses neb 3 x weekly but very rarely any proair Feels trelegy powder choking her No longer using 02 and wants it out of her house  rec  The key is to stop smoking completely before smoking completely stops you!  Plan A = Automatic = Bevespi Take  2 puffs first thing in am and then another 2 puffs about 12 hours later.  Work on inhaler technique: Plan B = Backup Only use your albuterol (PROAIR) as a rescue medication  Plan C = Crisis - only use your albuterol nebulizer if you first try Plan B  We will cancel your 02 You cleared for surgery with acceptable risk    07/31/2020  f/u ov/Tina Bailey re:  GOLD IV / ? 02 dep  Chief Complaint  Patient presents with   Consult    Pt states she has been having problems with her breathing and due to this, she has been more fatigued. When pt's O2 sats have been checked recently, her sats are in the 80s and this is on room air. Denies any complaints of cough or wheezing.  Dyspnea:  Hallway with rollator with drop in sats on  Reported x months Cough: no Sleeping: flat /on side / one pillow no no resp SABA use: on bevespi 2bid no real change  02: none  Covid  status:   Never vax but omicron 02/2020  Generally worse since fell and injured chest wall  Rec Make sure you check your oxygen saturation  at your highest level of activity  to be sure it stays over 90% and adjust  02 flow upward   Work on inhaler technique:  Plan A = Automatic = Always=   Besvespi (or Breztri sample) Take 2 puffs first thing in am and then another 2 puffs about 12 hours later  Plan B = Backup (to supplement plan A, not to replace it) Only use your albuterol nebulizer as a rescue medication     11/02/2020  f/u ov/Tina Bailey re: GOLD IV on prn 02 and maint on bevespi 2bid  Chief Complaint  Patient presents with   Follow-up  Dyspnea:  not walking as much this summer  Cough: none Sleeping: no resp symptoms flat SABA use: not tried  02: prn Covid status: never vax but had omicron Jan 2022       Rec Make sure you check your oxygen saturation  at your highest level of activity  to be sure it stays over 90% and adjust  02 flow upward to maintain this level if needed but remember to turn it back to previous settings when you stop (to conserve your supply).  Ok to Try albuterol 15 min before an activity (on alternating days)  that you know would normally make you short of breath Work on inhaler technique:    02/25/2021  f/u ov/Tina Bailey re: GOLD IV/ desats with ex    maint on breztri /   Chief Complaint  Patient presents with   Follow-up    Pt states need albuterol solution sent into pharmacy    Dyspnea:  [employer] leaning on cart x 30 min / hc parking on 2lpm pulsed and no desats   Cough: none Sleeping: sleeping on side/flat bed/ one pillow SABA use: neb but not using  02: 2 lpm pulsed not needing sitting or sleeping  Covid status:   omicron  Jan 2022  Oct 2022 / very vax    No obvious day to day or daytime variability or assoc excess/ purulent sputum or mucus plugs or hemoptysis or cp or chest tightness, subjective wheeze or overt sinus or hb symptoms.   Sleeping as above   without nocturnal  or early am exacerbation  of respiratory  c/o's or need for noct saba. Also denies any obvious fluctuation of symptoms  with weather or environmental changes or other aggravating or alleviating factors except as outlined above   No unusual exposure hx or h/o childhood pna/ asthma or knowledge of premature birth.  Current Allergies, Complete Past Medical History, Past Surgical History, Family History, and Social History were reviewed in [employer] record.  ROS  The following are not active complaints unless bolded Hoarseness, sore throat, dysphagia, dental problems, itching, sneezing,  nasal congestion or discharge of excess mucus or purulent secretions, ear ache,   fever, chills, sweats, unintended wt loss or wt gain, classically pleuritic or exertional cp,  orthopnea pnd or arm/hand swelling  or leg swelling, presyncope, palpitations, abdominal pain, anorexia, nausea, vomiting, diarrhea  or change in bowel habits or change in bladder habits, change in stools or change in urine, dysuria, hematuria,  rash, arthralgias, visual complaints, headache, numbness, weakness or ataxia or problems with walking or coordination,  change in mood/ anxious or  memory.        Current Meds  Medication Sig   acetaminophen (TYLENOL) 325 MG tablet Take 2 tablets (650 mg total) by mouth every 6 (six) hours as needed for mild pain (or Fever >/= 101).   albuterol (PROVENTIL) (2.5 MG/3ML) 0.083% nebulizer solution Take 3 mLs (2.5 mg total) by nebulization every 4 (four) hours as needed for wheezing or shortness of breath.   amLODipine (NORVASC) 2.5 MG tablet Take 2.5 mg by mouth daily.   aspirin-acetaminophen-caffeine (EXCEDRIN MIGRAINE) 250-250-65 MG tablet Take by mouth every 6 (six) hours as needed for headache.   Budeson-Glycopyrrol-Formoterol (BREZTRI AEROSPHERE) 160-9-4.8 MCG/ACT AERO Inhale 2 puffs into the lungs in the morning and at bedtime.   busPIRone (BUSPAR) 10 MG  tablet Take 2 tablets (20 mg total) by mouth 3 (three) times daily.   docusate sodium (COLACE) 100 MG capsule Take 100 mg by mouth 2 (two) times daily.   DULoxetine (CYMBALTA) 30 MG capsule Take 1 tablet by mouth daily.   HYDROcodone-acetaminophen (NORCO) 10-325 MG tablet Take 1 tablet by mouth in the morning, at noon, in the evening, and at bedtime.   LINZESS 72 MCG capsule Take 72 mcg by mouth every morning.   LORazepam (ATIVAN) 0.5 MG tablet Take 0.5 mg by mouth as needed.   melatonin 5 MG TABS melatonin 5 mg tablet  Take 1 tablet every day by oral route at bedtime.   promethazine (PHENERGAN) 12.5 MG tablet Take 12.5 mg by mouth every 6 (six) hours as needed for nausea or vomiting.                  Objective:   Physical Exam   Wts  02/25/2021        129  11/02/2020        131  07/31/20            122  05/12/2017        92   02/02/2017     98   03/03/15 109 lb 6.4 oz (49.624 kg)  02/26/15 110 lb (49.896 kg)  01/13/15 114 lb (51.71 kg)    Vital signs reviewed  02/25/2021  - Note at rest 02 sats  93% on 2lpm POC    General appearance:    elderly wf sitting on rollator    HEENT : pt wearing mask not removed for exam due to covid -19 concerns.    NECK :  without JVD/Nodes/TM/ nl carotid upstrokes bilaterally   LUNGS: no acc muscle use,  Mod barrel  contour  chest wall with bilateral  Distant bs s audible wheeze and  without cough on insp or exp maneuvers and mod  Hyperresonant  to  percussion bilaterally     CV:  RRR  no s3 or murmur or increase in P2, and no edema   ABD:  soft and nontender with pos mid insp Hoover's  in the supine position. No bruits or organomegaly appreciated, bowel sounds nl  MS:     ext warm without deformities, calf tenderness, cyanosis or clubbing No obvious joint restrictions   SKIN: warm and dry without lesions    NEURO:  alert, approp, nl sensorium with  no motor or cerebellar deficits apparent.                Assessment & Plan:

## 2021-02-26 ENCOUNTER — Encounter: Payer: Self-pay | Admitting: Internal Medicine

## 2021-02-26 NOTE — Assessment & Plan Note (Signed)
Qualified for amb 02 2022 07/31/2020  after desat to 85%ra with sob  < 250 ft, pt placed on POC 2lpm pulsed and sats increased to 93% and she walked additional 2 laps = 500 ft with sat at end 91%2lpm pulsed/moderate pace and minimal sob  Reviewed: Make sure you check your oxygen saturation  AT  your highest level of activity (not after you stop)   to be sure it stays over 90% and adjust  02 flow upward to maintain this level if needed but remember to turn it back to previous settings when you stop (to conserve your supply).           Each maintenance medication was reviewed in detail including emphasizing most importantly the difference between maintenance and prns and under what circumstances the prns are to be triggered using an action plan format where appropriate.  Total time for H and P, chart review, counseling, reviewing hfa/02 device(s) and generating customized AVS unique to this office visit / same day charting =25 min

## 2021-02-26 NOTE — Assessment & Plan Note (Signed)
Quit smoking 2019 Spirometry 03/03/2015   FEV1  1.04 (44%) ratio 58  - 03/03/2015  Walked RA x 3 laps @ 185 ft each stopped due to  End of study, brisk pace, no sob or desat   -  02/02/2017   Walked RA x one lap @ 185 stopped due to  Back pain/ sob no desats @ nl pace - 02/02/2017  After extensive coaching device effectiveness =    90% with DPI > try trelegy x one month then return for pfts  - Spirometry 05/12/2017  FEV1 0.58 (27%)  Ratio 44  With trelegy prior and no resp to saba  - 05/12/2017   try bevespi instead of trelegy due to throat irritation   - 07/31/2020 ex hypoxemia documented (see separate a/p)  - 02/25/2021  After extensive coaching inhaler device,  effectiveness =   75% from a baseline of < 30 % (short Ti)    Group D in terms of symptom/risk and laba/lama/ICS  therefore appropriate rx at this point >>>  Continue breztri 2 bid an approp saba  Re SABA :  I spent extra time with pt today reviewing appropriate use of albuterol for prn use on exertion with the following points: 1) saba is for relief of sob that does not improve by walking a slower pace or resting but rather if the pt does not improve after trying this first. 2) If the pt is convinced, as many are, that saba helps recover from activity faster then it's easy to tell if this is the case by re-challenging : ie stop, take the inhaler, then p 5 minutes try the exact same activity (intensity of workload) that just caused the symptoms and see if they are substantially diminished or not after saba 3) if there is an activity that reproducibly causes the symptoms, try the saba 15 min before the activity on alternate days   If in fact the saba really does help, then fine to continue to use it prn but advised may need to look closer at the maintenance regimen being used to achieve better control of airways disease with exertion.

## 2021-03-01 ENCOUNTER — Encounter (HOSPITAL_BASED_OUTPATIENT_CLINIC_OR_DEPARTMENT_OTHER): Payer: Self-pay | Admitting: Family

## 2021-03-01 ENCOUNTER — Other Ambulatory Visit: Payer: Self-pay

## 2021-03-01 ENCOUNTER — Ambulatory Visit (HOSPITAL_BASED_OUTPATIENT_CLINIC_OR_DEPARTMENT_OTHER): Payer: Medicare HMO | Admitting: Family

## 2021-03-01 VITALS — BP 122/74 | HR 87 | Ht 61.0 in | Wt 130.5 lb

## 2021-03-01 DIAGNOSIS — I712 Thoracic aortic aneurysm, without rupture, unspecified: Secondary | ICD-10-CM

## 2021-03-01 DIAGNOSIS — R5383 Other fatigue: Secondary | ICD-10-CM

## 2021-03-01 DIAGNOSIS — I10 Essential (primary) hypertension: Secondary | ICD-10-CM | POA: Diagnosis not present

## 2021-03-01 DIAGNOSIS — I5032 Chronic diastolic (congestive) heart failure: Secondary | ICD-10-CM | POA: Diagnosis not present

## 2021-03-01 NOTE — Patient Instructions (Signed)
Medication Instructions:  Recommend  a 2 week trial off your Amlodipine.   If your blood pressure is consistently more than 160 for the top number before the 2 weeks are, please let us know.   After the two week period, call or send [place] an update.   *If you need a refill on your cardiac medications before your next appointment, please call your pharmacy*   Lab Work: Your physician recommends that you return for lab work today: thyroid panel If you have labs (blood work) drawn today and your tests are completely normal, you will receive your results only by: MyChart Message (if you have MyChart) OR A paper copy in the mail If you have any lab test that is abnormal or we need to change your treatment, we will call you to review the results.   Testing/Procedures: Your EKG today shows normal sinus rhythm.   Follow-Up: At [facility], you and your health needs are our priority.  As part of our continuing mission to provide you with exceptional heart care, we have created designated Provider Care Teams.  These Care Teams include your primary Cardiologist (physician) and Advanced Practice Providers (APPs -  Physician Assistants and Nurse Practitioners) who all work together to provide you with the care you need, when you need it.  We recommend signing up for the patient portal called "MyChart".  Sign up information is provided on this After Visit Summary.  MyChart is used to connect with patients for Virtual Visits (Telemedicine).  Patients are able to view lab/test results, encounter notes, upcoming appointments, etc.  Non-urgent messages can be sent to your provider as well.   To learn more about what you can do with MyChart, go to [email or web address].    Your next appointment:   6 month(s)  The format for your next appointment:   In Person  Provider:   Buford Dresser, MD or Loel Dubonnet, NP     Other Instructions  Pursed Lip Breathing Pursed lip breathing is a  technique to relieve the feeling of being short of breath.  Being short of breath can make you tense and anxious. Before you start this breathing exercise, take a minute to relax your shoulders and close your eyes. Then: Start the exercise by closing your mouth. Breathe in through your nose, taking a normal breath. You can do this at your normal rate of breathing. If you feel you are not getting enough air, breathe in while slowly counting to 2 or 3. Pucker (purse) your lips as if you were going to whistle. Gently tighten the muscles of your abdomenor press on your abdomen to help push the air out. Breathe out slowly through your pursed lips. Take at least twice as long to breathe out as it takes you to breathe in. Make sure that you breathe out all of the air, but do not force air out. Ask your health care provider how often and how long to do this exercise.

## 2021-03-01 NOTE — Progress Notes (Signed)
Office Visit    Patient Name: Tina Bailey Date of Encounter: 03/01/2021  PCP:  Wendie Agreste, MD   [place]  Cardiologist:  Buford Dresser, MD  Advanced Practice Provider:  No care team member to display Electrophysiologist:  None    Chief Complaint    Tina Bailey is a 75 y.o. female with a hx of hepatocellular carcinoma s/p right hip mastectomy in 2016, chronic pain due to arthritis, prior tobacco use (approximately 57 years), COPD, chronic diastolic heart failure, lower extremity edema  presents today for follow up of diastolic heart failure.   Past Medical History    Past Medical History:  Diagnosis Date   Arthritis    DDD. Right shoulder"is frozen"-limited ROM. osteoporosis.   Blue toe syndrome ([place]) 11/27/2014   Dr. Claudia Pollock evaluating   CHF (congestive heart failure) (HCC)    Complication of anesthesia    COPD (chronic obstructive pulmonary disease) ([place])    Fibromyalgia    Fracture of rib of right side    hx "osteoporosis"- states her dog nudge her on the side, next day developed great pain and was told has a fracture rib.   Liver cancer ([place]) 12/2014   Macular degeneration    R eye   Osteoporosis    PONV (postoperative nausea and vomiting)    nausea,severe vomiting after 01-13-15 portal vein embolization   Productive cough    Retina disorder    L eye, vision distorted, edema   Spine fracture due to birth trauma    TMJ disease    Wears glasses    Past Surgical History:  Procedure Laterality Date   ANGIOPLASTY  2008   no stents required, no follow-up with cardiologist, no recurrent chest pain   CATARACT EXTRACTION, BILATERAL Bilateral    ESOPHAGOGASTRODUODENOSCOPY (EGD) WITH PROPOFOL N/A 06/25/2015   Procedure: ESOPHAGOGASTRODUODENOSCOPY (EGD) WITH PROPOFOL;  Surgeon: Milus Banister, MD;  Location: [facility];  Service: Endoscopy;  Laterality: N/A;  stent removal    EYE SURGERY     cornea surgery    INTRAMEDULLARY (IM) NAIL INTERTROCHANTERIC Right 12/04/2017   Procedure: INTRAMEDULLARY (IM) NAIL INTERTROCHANTRIC;  Surgeon: Rod Can, MD;  Location: [facility];  Service: Orthopedics;  Laterality: Right;   IR RADIOLOGIST EVAL & MGMT  03/29/2017   KIDNEY STONE SURGERY     LAPAROSCOPIC PARTIAL HEPATECTOMY N/A 03/04/2015   Procedure: DIAGNOSTIC LAPAROSCOPY, EXTENDED RIGHT HEPATECTOMY, WITH INTRAOPERATIVE ULTRASOUND;  Surgeon: Stark Klein, MD;  Location: [facility];  Service: General;  Laterality: N/A;   PARTIAL HYSTERECTOMY     portal vein embolization     01-13-15 -Dr. Cathlean Sauer.    Allergies  Allergies  Allergen Reactions   Zoloft [Sertraline Hcl]     Zoloft, had a terrible reaction   Amoxicillin-Pot Clavulanate Nausea Only    Amoxacillin Augmentin combo  Has patient had a PCN reaction causing immediate rash, facial/tongue/throat swelling, SOB or lightheadedness with hypotension: No Has patient had a PCN reaction causing severe rash involving mucus membranes or skin necrosis: No Has patient had a PCN reaction that required hospitalization No Has patient had a PCN reaction occurring within the last 10 years: No If all of the above answers are "NO", then may proceed with Cephalosporin use.    Clindamycin/Lincomycin Other (See Comments)    Felt like it "burned out" her stomach/took a large dose   Paxil [Paroxetine Hcl]    Paroxetine Hcl Rash   Sulfa Antibiotics Rash  History of Present Illness    Tina Bailey is a 75 y.o. female with a hx of hepatocellular carcinoma s/p right hip mastectomy in 2016, chronic pain due to arthritis, prior tobacco use (approximately 50 years), COPD, chronic diastolic heart failure, lower extremity edema last seen 08/28/2020 by Dr Harrell Gave.  She had an admission for lower extremity edema December 2019.  This occurred several weeks after a fall with fractures.  Lower extremity duplex was negative for DVT.  Echocardiogram during admission  with grade 1 diastolic dysfunction, severe pulmonary hypertension, right atrial mass.  She declined further evaluation of right atrial mass and routine monitoring of TIA as she stated she would not want CT surgery.  She was last seen 08/28/2020 by Dr. Harrell Gave.  She had a labile blood pressure and was encouraged to continue amlodipine 2.5 mg daily.  Presents today for follow-up with her daughter.  Notes no chest pain, pressure, tightness.  Reports stable dyspnea on exertion.  She was seen by pulmonology a few weeks ago and was told only to use her oxygen when needed and her oxygen levels have been good at rest but she often needs for ambulation.  No lightheadedness, dizziness, near syncope, CP.  Does endorse some fatigue over the past few months.  Wonders if it is medication induced.  She would like to trial a short time off her amlodipine to see if it improves.   EKGs/Labs/Other Studies Reviewed:   The following studies were reviewed today:  Arterial Duplex 05/17/2018: Summary:  Right: Total occlusion noted throughout the superficial femoral artery  with reconstitution / branches in the distal femoral artery feeding the  artery on distally .   Left: Small contracted common femoral artery and superficial femoral  artery with minimal flow extending into the distal femoral artery with  reconstitution by way of branches feeding the artery on distally.   ABI 05/17/2018: Summary:  Right: Resting right ankle-brachial index indicates moderate right lower  extremity arterial disease. The right toe-brachial index is abnormal.   Left: Resting left ankle-brachial index indicates mild left lower  extremity arterial disease. The left toe-brachial index is abnormal.  Echo 03/20/18 - Left ventricle: The cavity size was normal. Systolic function was   normal. The estimated ejection fraction was in the range of 55%   to 60%. Wall motion was normal; there were no regional wall   motion abnormalities.  Doppler parameters are consistent with   abnormal left ventricular relaxation (grade 1 diastolic   dysfunction). - Right ventricle: The cavity size was mildly dilated. Wall   thickness was normal. Systolic function was mildly reduced. - Right atrium: There was a mass in the atrial cavity adjacent to   the intra-atrial septum. - Tricuspid valve: There was moderate regurgitation. - Pulmonary arteries: Systolic pressure was severely increased. PA   peak pressure: 60 mm Hg (S). - Pericardium, extracardiac: A small pericardial effusion was   identified. There is mitral inflow respiratory variability, but   no clear RA/RV collapse.   Impressions:   - Normal LV EF, grade 1 diastolic dysfunction.   There is a structure in the right atrial cavity adjacent to the   intra-atrial septum that cannot be well defined. Could be very   hypertrophied septum, though favor other etiology, cannot exclude   thrombus, myxoma, or other.   Severe pulmonary hypertension, with mild RV dysfunction and   dilation.   Small pericardial effusion, predominantly posterior. There is   mitral inflow variability, though this may  be unrelated to   effusion. No RA/RV collapse seen.  Arterial duplex 02/20/18 Right: Total occlusion noted in the superficial femoral artery with reconstitution in the popliteal artery.  ABI 02/20/18 Right: Resting right ankle-brachial index indicates moderate right lower extremity arterial disease. The right toe-brachial index is abnormal.   Left: Resting left ankle-brachial index indicates mild left lower extremity arterial disease. The left toe-brachial index is abnormal.  Nuclear stress 01/12/15 Nuclear stress EF: 67%. There was no ST segment deviation noted during stress. The study is normal. This is a low risk study. The left ventricular ejection fraction is hyperdynamic (>65%).  EKG:  EKG is ordered today.  The ekg ordered today demonstrates NSR  87 bpm with no acute ST/T wave  changes.   Recent Labs: 01/08/2021: ALT 16; BUN 13; Creatinine, Ser 0.61; Hemoglobin 13.8; Platelets 299; Potassium 4.2; Sodium 134  Recent Lipid Panel    Component Value Date/Time   CHOL 188 01/09/2017 1109   TRIG 71.0 01/09/2017 1109   HDL 91.40 01/09/2017 1109   CHOLHDL 2 01/09/2017 1109   VLDL 14.2 01/09/2017 1109   LDLCALC 82 01/09/2017 1109   Home Medications   Current Meds  Medication Sig   acetaminophen (TYLENOL) 325 MG tablet Take 2 tablets (650 mg total) by mouth every 6 (six) hours as needed for mild pain (or Fever >/= 101).   albuterol (PROVENTIL) (2.5 MG/3ML) 0.083% nebulizer solution Take 3 mLs (2.5 mg total) by nebulization every 4 (four) hours as needed for wheezing or shortness of breath.   amLODipine (NORVASC) 2.5 MG tablet Take 2.5 mg by mouth daily.   aspirin-acetaminophen-caffeine (EXCEDRIN MIGRAINE) 250-250-65 MG tablet Take by mouth every 6 (six) hours as needed for headache.   Bismuth Subsalicylate 349 ZP/91TA SUSP Take 30 mLs by mouth every 8 (eight) hours as needed.   Budeson-Glycopyrrol-Formoterol (BREZTRI AEROSPHERE) 160-9-4.8 MCG/ACT AERO Inhale 2 puffs into the lungs in the morning and at bedtime.   busPIRone (BUSPAR) 10 MG tablet Take 2 tablets (20 mg total) by mouth 3 (three) times daily.   docusate sodium (COLACE) 100 MG capsule Take 100 mg by mouth 2 (two) times daily.   DULoxetine (CYMBALTA) 30 MG capsule Take 1 tablet by mouth daily.   HYDROcodone-acetaminophen (NORCO) 10-325 MG tablet Take 1 tablet by mouth in the morning, at noon, in the evening, and at bedtime.   LINZESS 72 MCG capsule Take 72 mcg by mouth every morning.   LORazepam (ATIVAN) 0.5 MG tablet Take 0.5 mg by mouth as needed.   melatonin 5 MG TABS melatonin 5 mg tablet  Take 1 tablet every day by oral route at bedtime.   promethazine (PHENERGAN) 12.5 MG tablet Take 12.5 mg by mouth every 6 (six) hours as needed for nausea or vomiting.    Review of Systems      All other systems  reviewed and are otherwise negative except as noted above.  Physical Exam    VS:  BP 122/74   Pulse 87   Ht 5\' 1"  (1.549 m)   Wt 130 lb 8 oz (59.2 kg)   SpO2 90%   BMI 24.66 kg/m  , BMI Body mass index is 24.66 kg/m.  Wt Readings from Last 3 Encounters:  03/01/21 130 lb 8 oz (59.2 kg)  02/25/21 129 lb 3.2 oz (58.6 kg)  02/18/21 130 lb (59 kg)     GEN: Well nourished, well developed, in no acute distress. HEENT: normal. Neck: Supple, no JVD, carotid bruits, or masses. Cardiac:  RRR, no murmurs, rubs, or gallops. No clubbing, cyanosis, edema.  Radials/PT 2+ and equal bilaterally.  Respiratory:  Respirations regular and unlabored, clear to auscultation bilaterally. GI: Soft, nontender, nondistended. MS: No deformity or atrophy. Skin: Warm and dry, no rash. Neuro:  Strength and sensation are intact. Psych: Normal affect.  Assessment & Plan    Fatigue - 2-3 month history. Will trial 2 week off Amlodipine as she worries this is causing her fatigue. She will call us with update on symptoms and BP in 2 weeks. Thyroid panel collected today to rule out hypothyroidism as contributory. Labs 12/2020 with no anemia. Recommend further follow up with PCP. Consider etiology deconditioning, depression, hypothyroidism, hypotension.   Chronic diastolic heart failure - Euvolemic and well compensated on exam. No indication for loop diuretic at this time. Low salt diet, fluid restriction <2L encouraged.   HTN - Has been labile in the past. Well controlled today. She will continue to monitor at home and hold Amlodipine for 2 weeks due to fatigue as detailed above.   TAA - She would not want surgery and does not wish for further monitoring per Dr. Judeth Cornfield previous notes.    Disposition: Follow up with MyChart message in 2 weeks and in 6 month(s) with Buford Dresser, MD or APP.  Signed, Loel Dubonnet, NP 03/01/2021, 3:40 PM [place]

## 2021-03-02 ENCOUNTER — Encounter (HOSPITAL_BASED_OUTPATIENT_CLINIC_OR_DEPARTMENT_OTHER): Payer: Self-pay

## 2021-03-02 LAB — THYROID PANEL WITH TSH
Free Thyroxine Index: 1.6 (ref 1.2–4.9)
T3 Uptake Ratio: 27 % (ref 24–39)
T4, Total: 6.1 ug/dL (ref 4.5–12.0)
TSH: 0.843 u[IU]/mL (ref 0.450–4.500)

## 2021-03-03 NOTE — Progress Notes (Signed)
Seen by patient Tina Bailey on 03/02/2021  7:02 PM

## 2021-03-04 NOTE — Telephone Encounter (Signed)
Concerns addressed at 12/12 appointment with Laurann Montana, NP.

## 2021-03-06 DIAGNOSIS — J9611 Chronic respiratory failure with hypoxia: Secondary | ICD-10-CM | POA: Diagnosis not present

## 2021-03-07 ENCOUNTER — Other Ambulatory Visit: Payer: Self-pay | Admitting: Family Medicine

## 2021-03-07 DIAGNOSIS — F418 Other specified anxiety disorders: Secondary | ICD-10-CM

## 2021-03-16 ENCOUNTER — Encounter (HOSPITAL_BASED_OUTPATIENT_CLINIC_OR_DEPARTMENT_OTHER): Payer: Self-pay

## 2021-04-02 ENCOUNTER — Telehealth (HOSPITAL_BASED_OUTPATIENT_CLINIC_OR_DEPARTMENT_OTHER): Payer: Self-pay

## 2021-04-02 NOTE — Telephone Encounter (Signed)
Addressed via telephone 04/02/21. See telephone encounter.   Loel Dubonnet, NP

## 2021-04-02 NOTE — Telephone Encounter (Signed)
Speaking with Ms. Ewton's daughter. She says she is not currently with her mother, but will send [place] a blood pressure log once she gets to her mother and can see the readings!    She believes the am pressures are still in the 160's and the afternoon pressures are in the 130-140 range. She notes Ms. Schueler feels good though!

## 2021-04-02 NOTE — Telephone Encounter (Signed)
Called patient to follow up on swelling within the legs at the request of her daughter. Spoke with the patient who denies any problematic swelling. Told her if she should start to encounter this keeping the legs elevated, wearing compression stockings and reducing salt intake are all helpful. Patient will remain off Amlodipine and it has been removed from her medication list.     "Do you mind calling Miss Boorman [phone]) to check on swelling? Recommend she elevate her legs, wear compression stockings, follow low salt diet.    Her blood pressure is reasonably well controlled. She may remain off Amlodipine. I would continue to rest 5-10 minutes and take some deep breaths prior to checking her blood pressure. That likely yields the most accurate result.   Loel Dubonnet, NP"

## 2021-04-02 NOTE — Telephone Encounter (Signed)
Do you mind calling Tina Bailey [phone]) to check on swelling? Recommend she elevate her legs, wear compression stockings, follow low salt diet.   Her blood pressure is reasonably well controlled. She may remain off Amlodipine. I would continue to rest 5-10 minutes and take some deep breaths prior to checking her blood pressure. That likely yields the most accurate result.  Loel Dubonnet, NP

## 2021-04-15 ENCOUNTER — Encounter: Payer: Self-pay | Admitting: Family Medicine

## 2021-04-15 NOTE — Telephone Encounter (Signed)
I do not see any recent urine testing or evaluation and I am concerned about her symptoms.  Would want to make sure she does not have an acute UTI.  Should not wait until next Thursday.  Please try to have her seen by a provider tomorrow, 04/16/2021 for urine testing, and possible exam as differential includes atrophic vaginitis if dysuria without sign of infection.  I will be happy to send her to urology -  can certainly send a referral when we talk next week, but do want her current symptoms evaluated sooner.

## 2021-04-15 NOTE — Telephone Encounter (Signed)
Pt is asking for referral to urology for continued issues with burning urination and incontinence, pt reports has had several check for UTI negative each time and is wanting to see specialty now   Okay to send or do you want a visit, most recent 02/18/21

## 2021-04-16 NOTE — Telephone Encounter (Signed)
Called daughter and expressed concerns, pt daughter insisted she see [place] rather than urgent care as no appt are available today.  Expressed concerns about waiting but pt daughter states she will monitor and take her if unmanageable this weekend.    Scheduled to see Jamelle Haring 04/19/21 2:50

## 2021-04-19 ENCOUNTER — Ambulatory Visit (INDEPENDENT_AMBULATORY_CARE_PROVIDER_SITE_OTHER): Payer: Medicare HMO | Admitting: Registered Nurse

## 2021-04-19 ENCOUNTER — Encounter: Payer: Self-pay | Admitting: Registered Nurse

## 2021-04-19 VITALS — HR 85 | Temp 98.1°F | Resp 17 | Ht 61.0 in | Wt 129.4 lb

## 2021-04-19 DIAGNOSIS — R350 Frequency of micturition: Secondary | ICD-10-CM

## 2021-04-19 LAB — POCT URINALYSIS DIP (MANUAL ENTRY)
Bilirubin, UA: NEGATIVE
Blood, UA: NEGATIVE
Glucose, UA: NEGATIVE mg/dL
Ketones, POC UA: NEGATIVE mg/dL
Nitrite, UA: NEGATIVE
Spec Grav, UA: 1.015 (ref 1.010–1.025)
Urobilinogen, UA: 0.2 E.U./dL
pH, UA: 6.5 (ref 5.0–8.0)

## 2021-04-19 MED ORDER — CIPROFLOXACIN HCL 250 MG PO TABS
250.0000 mg | ORAL_TABLET | Freq: Two times a day (BID) | ORAL | 0 refills | Status: AC
Start: 2021-04-19 — End: 2021-04-22

## 2021-04-19 NOTE — Patient Instructions (Addendum)
Tina Bailey -  Pleasure to meet you  Take the cipro as instructed with food and something to drink   Call if things don't get better within 48 hours.  I will let you know if the urine culture shows concerns  Thank you,  Rich    If you have lab work done today you will be contacted with your lab results within the next 2 weeks.  If you have not heard from [place] then please contact us. The fastest way to get your results is to register for My Chart.   IF you received an x-ray today, you will receive an invoice from [facility]. Please contact [facility] at [phone] with questions or concerns regarding your invoice.   IF you received labwork today, you will receive an invoice from [facility]. Please contact [facility] at [phone] with questions or concerns regarding your invoice.   Our billing staff will not be able to assist you with questions regarding bills from these companies.  You will be contacted with the lab results as soon as they are available. The fastest way to get your results is to activate your My Chart account. Instructions are located on the last page of this paperwork. If you have not heard from [place] regarding the results in 2 weeks, please contact this office.

## 2021-04-19 NOTE — Progress Notes (Signed)
Established Patient Office Visit  Subjective:  Patient ID: Tina Bailey, female    DOB: 01/19/1946  Age: 76 y.o. MRN: [id number]  CC:  Chief Complaint  Patient presents with   Urinary Frequency    Patient states she has been having some frequent urination, pressure and some burning.    HPI Tina Bailey presents for dysuria  Onset one week ago Dysuria, frequency, suprapubic pressure No constitutional symptoms No vaginal symptoms Denies flank pain  Past Medical History:  Diagnosis Date   Arthritis    DDD. Right shoulder"is frozen"-limited ROM. osteoporosis.   Blue toe syndrome ([place]) 11/27/2014   Dr. Claudia Pollock evaluating   CHF (congestive heart failure) (HCC)    Complication of anesthesia    COPD (chronic obstructive pulmonary disease) ([place])    Fibromyalgia    Fracture of rib of right side    hx "osteoporosis"- states her dog nudge her on the side, next day developed great pain and was told has a fracture rib.   Liver cancer ([place]) 12/2014   Macular degeneration    R eye   Osteoporosis    PONV (postoperative nausea and vomiting)    nausea,severe vomiting after 01-13-15 portal vein embolization   Productive cough    Retina disorder    L eye, vision distorted, edema   Spine fracture due to birth trauma    TMJ disease    Wears glasses     Past Surgical History:  Procedure Laterality Date   ANGIOPLASTY  2008   no stents required, no follow-up with cardiologist, no recurrent chest pain   CATARACT EXTRACTION, BILATERAL Bilateral    ESOPHAGOGASTRODUODENOSCOPY (EGD) WITH PROPOFOL N/A 06/25/2015   Procedure: ESOPHAGOGASTRODUODENOSCOPY (EGD) WITH PROPOFOL;  Surgeon: Milus Banister, MD;  [facility]: [facility];  Service: Endoscopy;  Laterality: N/A;  stent removal    EYE SURGERY     cornea surgery   INTRAMEDULLARY (IM) NAIL INTERTROCHANTERIC Right 12/04/2017   Procedure: INTRAMEDULLARY (IM) NAIL INTERTROCHANTRIC;  Surgeon: Rod Can, MD;  Location: [facility];   Service: Orthopedics;  Laterality: Right;   IR RADIOLOGIST EVAL & MGMT  03/29/2017   KIDNEY STONE SURGERY     LAPAROSCOPIC PARTIAL HEPATECTOMY N/A 03/04/2015   Procedure: DIAGNOSTIC LAPAROSCOPY, EXTENDED RIGHT HEPATECTOMY, WITH INTRAOPERATIVE ULTRASOUND;  Surgeon: Stark Klein, MD;  Location: [facility];  Service: General;  Laterality: N/A;   PARTIAL HYSTERECTOMY     portal vein embolization     01-13-15 -Dr. Cathlean Sauer.    Family History  Problem Relation Age of Onset   Hypertension Mother    Atrial fibrillation Mother    Heart disease Mother        after age 56   Breast cancer Mother    Stroke Father    Dementia Father    Peptic Ulcer Father    Heart disease Brother        After age 29- A-Fib   Heart attack Brother     Social History   Socioeconomic History   Marital status: Divorced    Spouse name: Not on file   Number of children: 3   Years of education: Not on file   Highest education level: Not on file  Occupational History   Occupation: retired  Tobacco Use   Smoking status: Former    Packs/day: 1.00    Years: 45.00    Pack years: 45.00    Types: Cigarettes    Quit date: 02/20/2018    Years since quitting: 3.1  Smokeless tobacco: Never  Vaping Use   Vaping Use: Never used  Substance and Sexual Activity   Alcohol use: No    Alcohol/week: 0.0 standard drinks   Drug use: No   Sexual activity: Not Currently  Other Topics Concern   Not on file  Social History Narrative   Not on file   Social Determinants of Health   Financial Resource Strain: Not on file  Food Insecurity: Not on file  Transportation Needs: Not on file  Physical Activity: Not on file  Stress: Not on file  Social Connections: Not on file  Intimate Partner Violence: Not on file    Outpatient Medications Prior to Visit  Medication Sig Dispense Refill   acetaminophen (TYLENOL) 325 MG tablet Take 2 tablets (650 mg total) by mouth every 6 (six) hours as needed for mild pain (or Fever  >/= 101).     albuterol (PROVENTIL) (2.5 MG/3ML) 0.083% nebulizer solution Take 3 mLs (2.5 mg total) by nebulization every 4 (four) hours as needed for wheezing or shortness of breath. 75 mL 12   aspirin-acetaminophen-caffeine (EXCEDRIN MIGRAINE) 093-818-29 MG tablet Take by mouth every 6 (six) hours as needed for headache.     Bismuth Subsalicylate 937 JI/96VE SUSP Take 30 mLs by mouth every 8 (eight) hours as needed.     Budeson-Glycopyrrol-Formoterol (BREZTRI AEROSPHERE) 160-9-4.8 MCG/ACT AERO Inhale 2 puffs into the lungs in the morning and at bedtime. 5.9 g 0   busPIRone (BUSPAR) 10 MG tablet TAKE 2 TABLETS BY MOUTH 3 TIMES DAILY. 540 tablet 1   docusate sodium (COLACE) 100 MG capsule Take 100 mg by mouth 2 (two) times daily.     DULoxetine (CYMBALTA) 30 MG capsule Take 1 tablet by mouth daily.     HYDROcodone-acetaminophen (NORCO) 10-325 MG tablet Take 1 tablet by mouth in the morning, at noon, in the evening, and at bedtime.     LINZESS 72 MCG capsule Take 72 mcg by mouth every morning.     LORazepam (ATIVAN) 0.5 MG tablet Take 0.5 mg by mouth as needed.     melatonin 5 MG TABS melatonin 5 mg tablet  Take 1 tablet every day by oral route at bedtime.     promethazine (PHENERGAN) 12.5 MG tablet Take 12.5 mg by mouth every 6 (six) hours as needed for nausea or vomiting.     No facility-administered medications prior to visit.    Allergies  Allergen Reactions   Zoloft [Sertraline Hcl]     Zoloft, had a terrible reaction   Amoxicillin-Pot Clavulanate Nausea Only    Amoxacillin Augmentin combo  Has patient had a PCN reaction causing immediate rash, facial/tongue/throat swelling, SOB or lightheadedness with hypotension: No Has patient had a PCN reaction causing severe rash involving mucus membranes or skin necrosis: No Has patient had a PCN reaction that required hospitalization No Has patient had a PCN reaction occurring within the last 10 years: No If all of the above answers are "NO",  then may proceed with Cephalosporin use.    Clindamycin/Lincomycin Other (See Comments)    Felt like it "burned out" her stomach/took a large dose   Paxil [Paroxetine Hcl]    Paroxetine Hcl Rash   Sulfa Antibiotics Rash    ROS Review of Systems Per hpi     Objective:    Physical Exam Vitals and nursing note reviewed.  Constitutional:      General: She is not in acute distress.    Appearance: Normal appearance. She is normal weight.  She is not ill-appearing, toxic-appearing or diaphoretic.  Cardiovascular:     Rate and Rhythm: Normal rate and regular rhythm.     Heart sounds: Normal heart sounds. No murmur heard.   No friction rub. No gallop.  Pulmonary:     Effort: Pulmonary effort is normal. No respiratory distress.     Breath sounds: Normal breath sounds. No stridor. No wheezing, rhonchi or rales.  Chest:     Chest wall: No tenderness.  Skin:    General: Skin is warm and dry.  Neurological:     General: No focal deficit present.     Mental Status: She is alert and oriented to person, place, and time. Mental status is at baseline.  Psychiatric:        Mood and Affect: Mood normal.        Behavior: Behavior normal.        Thought Content: Thought content normal.        Judgment: Judgment normal.    Pulse 85    Temp 98.1 F (36.7 C) (Temporal)    Resp 17    Ht _0  (1.549 m)    Wt 129 lb 6.4 oz (58.7 kg)    SpO2 99%    BMI 24.45 kg/m  Wt Readings from Last 3 Encounters:  04/19/21 129 lb 6.4 oz (58.7 kg)  03/01/21 130 lb 8 oz (59.2 kg)  02/25/21 129 lb 3.2 oz (58.6 kg)     Health Maintenance Due  Topic Date Due   COVID-19 Vaccine (1) Never done   Hepatitis C Screening  Never done    There are no preventive care reminders to display for this patient.  Lab Results  Component Value Date   TSH 0.843 03/01/2021   Lab Results  Component Value Date   WBC 6.2 01/08/2021   HGB 13.8 01/08/2021   HCT 44.3 01/08/2021   MCV 90.6 01/08/2021   PLT 299 01/08/2021    Lab Results  Component Value Date   NA 134 (L) 01/08/2021   K 4.2 01/08/2021   CHLORIDE 94 (L) 10/19/2016   CO2 30 01/08/2021   GLUCOSE 100 (H) 01/08/2021   BUN 13 01/08/2021   CREATININE 0.61 01/08/2021   BILITOT 0.7 01/08/2021   ALKPHOS 69 01/08/2021   AST 22 01/08/2021   ALT 16 01/08/2021   PROT 7.5 01/08/2021   ALBUMIN 4.2 01/08/2021   CALCIUM 8.8 (L) 01/08/2021   ANIONGAP 7 01/08/2021   EGFR 84 (L) 10/19/2016   GFR 120.18 04/02/2018   Lab Results  Component Value Date   CHOL 188 01/09/2017   Lab Results  Component Value Date   HDL 91.40 01/09/2017   Lab Results  Component Value Date   LDLCALC 82 01/09/2017   Lab Results  Component Value Date   TRIG 71.0 01/09/2017   Lab Results  Component Value Date   CHOLHDL 2 01/09/2017   No results found for: HGBA1C    Assessment & Plan:   Problem List Items Addressed This Visit   None Visit Diagnoses     Frequent urination    -  Primary   Relevant Medications   ciprofloxacin (CIPRO) 250 MG tablet   Other Relevant Orders   POCT urinalysis dipstick (Completed)   Urine Culture       Meds ordered this encounter  Medications   ciprofloxacin (CIPRO) 250 MG tablet    Sig: Take 1 tablet (250 mg total) by mouth 2 (two) times daily for 3 days.  Dispense:  6 tablet    Refill:  0    Order Specific Question:   Supervising Provider    Answer:   Carlota Raspberry, JEFFREY R [[id number]]    Follow-up: Return if symptoms worsen or fail to improve.   PLAN Protein and leukocytes on poct ua. Will treat with cirpo as above and send culture Adjust treatment if warranted depending on result of culture Patient encouraged to call clinic with any questions, comments, or concerns.  Maximiano Coss, NP

## 2021-04-22 ENCOUNTER — Encounter: Payer: Self-pay | Admitting: Family Medicine

## 2021-04-22 ENCOUNTER — Ambulatory Visit (INDEPENDENT_AMBULATORY_CARE_PROVIDER_SITE_OTHER): Payer: Medicare HMO | Admitting: Family Medicine

## 2021-04-22 VITALS — BP 128/78 | HR 82 | Temp 98.2°F | Resp 15 | Ht 61.0 in | Wt 128.4 lb

## 2021-04-22 DIAGNOSIS — F418 Other specified anxiety disorders: Secondary | ICD-10-CM | POA: Diagnosis not present

## 2021-04-22 DIAGNOSIS — R350 Frequency of micturition: Secondary | ICD-10-CM | POA: Diagnosis not present

## 2021-04-22 MED ORDER — HYDROXYZINE HCL 10 MG PO TABS
5.0000 mg | ORAL_TABLET | Freq: Three times a day (TID) | ORAL | 0 refills | Status: DC | PRN
Start: 2021-04-22 — End: 2021-07-16

## 2021-04-22 MED ORDER — BUSPIRONE HCL 10 MG PO TABS: ORAL_TABLET | ORAL | 1 refills | Status: DC

## 2021-04-22 NOTE — Progress Notes (Signed)
Subjective:  Patient ID: Tina Bailey, female    DOB: [birth year]  Age: 76 y.o. MRN: [id number]  CC:  Chief Complaint  Patient presents with   Anxiety    Pt here for follow up anxiety, no noted concerns    Recurrent UTI    Pt had recent UTI and was placed on abx on Monday,     HPI Tina Bailey presents for  Follow-up from establish care visit on December 1.  Depression/anxiety Continues on Cymbalta, buspirone 20mg  tid. Rare need for benzodiazepine.  Lorazepam. Still has some at home. About every 2 weeks to take edge. Caution discussed with class of medications and narcotic pain medications. .  Controlled substance database reviewed, no recent benzodiazepine, last listing for diazepam in March 2021  Urinary frequency Office visit few days ago with my colleague.  Dysuria, frequency, suprapubic pressure starting 1 week prior.  Trace leukocytes on urinalysis, urine culture was not obtained.  Multiple antibiotic allergies including sulfa, amoxicillin.  Was started on Cipro 250 mg twice daily for 3 days.  Burning has diminished. Urinary frequency improved.  No fevers, feeling better.   Some memory concerns since about October - plans to discuss at follow up.     History Patient Active Problem List   Diagnosis Date Noted   Exercise hypoxemia 08/01/2020   Personal history of COVID-19 04/29/2020   History of peripheral edema 01/14/2019   Skin picking habit 12/02/2018   Thoracic aortic aneurysm without rupture 04/13/2018   Pulmonary hypertension, unspecified ([place]) 04/13/2018   Bilateral leg edema 04/13/2018   Severe episode of recurrent major depressive disorder, without psychotic features ([place]) 04/02/2018   Chronic diastolic heart failure (HCC)    Hyponatremia 02/28/2018   COPD (chronic obstructive pulmonary disease) ([place]) 02/28/2018   Tobacco abuse 02/28/2018   Acute blood loss anemia 12/06/2017   Thrombocytopenia ([place]) 12/06/2017   Severe protein-calorie  malnutrition ([place]) 12/05/2017   Closed comminuted intertrochanteric fracture of proximal end of right femur ([place]) 12/04/2017   Chronic thoracic back pain 03/31/2017   Physical exam 01/09/2017   Malnutrition of moderate degree 12/22/2016   Anxiety about health 09/27/2016   Osteoporosis 07/13/2016   Chronic narcotic use 07/13/2016   Constipation 04/26/2015   Chronic pain 04/26/2015   Depression with anxiety 04/26/2015   COPD GOLD IV /  02 dep with exertion 03/03/2015   Hepatocellular carcinoma ([place]) 12/30/2014   Pancreatic mass 12/04/2014   Blue toe syndrome ([place]) 11/27/2014   Past Medical History:  Diagnosis Date   Arthritis    DDD. Right shoulder"is frozen"-limited ROM. osteoporosis.   Blue toe syndrome ([place]) 11/27/2014   Dr. Claudia Pollock evaluating   CHF (congestive heart failure) (HCC)    Complication of anesthesia    COPD (chronic obstructive pulmonary disease) ([place])    Fibromyalgia    Fracture of rib of right side    hx "osteoporosis"- states her dog nudge her on the side, next day developed great pain and was told has a fracture rib.   Liver cancer ([place]) 12/2014   Macular degeneration    R eye   Osteoporosis    PONV (postoperative nausea and vomiting)    nausea,severe vomiting after 01-13-15 portal vein embolization   Productive cough    Retina disorder    L eye, vision distorted, edema   Spine fracture due to birth trauma    TMJ disease    Wears glasses    Past Surgical History:  Procedure Laterality Date   ANGIOPLASTY  2008   no stents required, no follow-up with cardiologist, no recurrent chest pain   CATARACT EXTRACTION, BILATERAL Bilateral    ESOPHAGOGASTRODUODENOSCOPY (EGD) WITH PROPOFOL N/A 06/25/2015   Procedure: ESOPHAGOGASTRODUODENOSCOPY (EGD) WITH PROPOFOL;  Surgeon: Milus Banister, MD;  [facility];  Service: Endoscopy;  Laterality: N/A;  stent removal    EYE SURGERY     cornea surgery   INTRAMEDULLARY (IM) NAIL INTERTROCHANTERIC Right  12/04/2017   Procedure: INTRAMEDULLARY (IM) NAIL INTERTROCHANTRIC;  Surgeon: Rod Can, MD;  [facility];  Service: Orthopedics;  Laterality: Right;   IR RADIOLOGIST EVAL & MGMT  03/29/2017   KIDNEY STONE SURGERY     LAPAROSCOPIC PARTIAL HEPATECTOMY N/A 03/04/2015   Procedure: DIAGNOSTIC LAPAROSCOPY, EXTENDED RIGHT HEPATECTOMY, WITH INTRAOPERATIVE ULTRASOUND;  Surgeon: Stark Klein, MD;  Location[facility];  Service: General;  Laterality: N/A;   PARTIAL HYSTERECTOMY     portal vein embolization     01-13-15 -Dr. Cathlean Sauer.   Allergies  Allergen Reactions   Zoloft [Sertraline Hcl]     Zoloft, had a terrible reaction   Amoxicillin-Pot Clavulanate Nausea Only    Amoxacillin Augmentin combo  Has patient had a PCN reaction causing immediate rash, facial/tongue/throat swelling, SOB or lightheadedness with hypotension: No Has patient had a PCN reaction causing severe rash involving mucus membranes or skin necrosis: No Has patient had a PCN reaction that required hospitalization No Has patient had a PCN reaction occurring within the last 10 years: No If all of the above answers are "NO", then may proceed with Cephalosporin use.    Clindamycin/Lincomycin Other (See Comments)    Felt like it "burned out" her stomach/took a large dose   Paxil [Paroxetine Hcl]    Paroxetine Hcl Rash   Sulfa Antibiotics Rash   Prior to Admission medications   Medication Sig Start Date End Date Taking? Authorizing Provider  acetaminophen (TYLENOL) 325 MG tablet Take 2 tablets (650 mg total) by mouth every 6 (six) hours as needed for mild pain (or Fever >/= 101). 03/06/18  Yes Florencia Reasons, MD  albuterol (PROVENTIL) (2.5 MG/3ML) 0.083% nebulizer solution Take 3 mLs (2.5 mg total) by nebulization every 4 (four) hours as needed for wheezing or shortness of breath. 02/25/21  Yes Tanda Rockers, MD  aspirin-acetaminophen-caffeine (EXCEDRIN MIGRAINE) [id number] MG tablet Take by mouth every 6 (six) hours as  needed for headache.   Yes [provider]  Bismuth Subsalicylate 998 PJ/82NK SUSP Take 30 mLs by mouth every 8 (eight) hours as needed.   Yes [provider]  Budeson-Glycopyrrol-Formoterol (BREZTRI AEROSPHERE) 160-9-4.8 MCG/ACT AERO Inhale 2 puffs into the lungs in the morning and at bedtime. 11/02/20  Yes Tanda Rockers, MD  busPIRone (BUSPAR) 10 MG tablet TAKE 2 TABLETS BY MOUTH 3 TIMES DAILY. 03/08/21  Yes Wendie Agreste, MD  docusate sodium (COLACE) 100 MG capsule Take 100 mg by mouth 2 (two) times daily.   Yes [provider]  DULoxetine (CYMBALTA) 30 MG capsule Take 1 tablet by mouth daily.   Yes [provider]  HYDROcodone-acetaminophen (NORCO) 10-325 MG tablet Take 1 tablet by mouth in the morning, at noon, in the evening, and at bedtime.   Yes [provider]  LINZESS 72 MCG capsule Take 72 mcg by mouth every morning. 01/13/21  Yes [provider]  LORazepam (ATIVAN) 0.5 MG tablet Take 0.5 mg by mouth as needed. 05/20/19  Yes [provider]  melatonin 5 MG TABS melatonin 5 mg tablet  Take 1 tablet every day by oral route at bedtime.   Yes [provider]  promethazine (PHENERGAN) 12.5 MG tablet Take 12.5 mg by mouth every 6 (six) hours as needed for nausea or vomiting.   Yes [provider]  ciprofloxacin (CIPRO) 250 MG tablet Take 1 tablet (250 mg total) by mouth 2 (two) times daily for 3 days. Patient not taking: Reported on 04/22/2021 04/19/21 04/22/21  Maximiano Coss, NP   Social History   Socioeconomic History   Marital status: Divorced    Spouse name: Not on file   Number of children: 3   Years of education: Not on file   Highest education level: Not on file  Occupational History   Occupation: retired  Tobacco Use   Smoking status: Former    Packs/day: 1.00    Years: 45.00    Pack years: 45.00    Types: Cigarettes    Quit date: 02/20/2018    Years since quitting: 3.1   Smokeless tobacco:  Never  Vaping Use   Vaping Use: Never used  Substance and Sexual Activity   Alcohol use: No    Alcohol/week: 0.0 standard drinks   Drug use: No   Sexual activity: Not Currently  Other Topics Concern   Not on file  Social History Narrative   Not on file   Social Determinants of Health   Financial Resource Strain: Not on file  Food Insecurity: Not on file  Transportation Needs: Not on file  Physical Activity: Not on file  Stress: Not on file  Social Connections: Not on file  Intimate Partner Violence: Not on file    Review of Systems Per HPI>   Objective:   Vitals:   04/22/21 1337  BP: 128/78  Pulse: 82  Resp: 15  Temp: 98.2 F (36.8 C)  TempSrc: Temporal  SpO2: 92%  Weight: 128 lb 6.4 oz (58.2 kg)  Height: 5\' 1"  (1.549 m)     Physical Exam Vitals reviewed.  Constitutional:      General: She is not in acute distress.    Appearance: Normal appearance. She is well-developed. She is not toxic-appearing.  HENT:     Head: Normocephalic and atraumatic.  Eyes:     Conjunctiva/sclera: Conjunctivae normal.     Pupils: Pupils are equal, round, and reactive to light.  Neck:     Vascular: No carotid bruit.  Cardiovascular:     Rate and Rhythm: Normal rate and regular rhythm.     Heart sounds: Normal heart sounds.  Pulmonary:     Effort: Pulmonary effort is normal.     Breath sounds: Normal breath sounds.  Abdominal:     General: Abdomen is flat. Bowel sounds are normal.     Palpations: Abdomen is soft. There is no pulsatile mass.     Tenderness: There is no abdominal tenderness. There is no right CVA tenderness, left CVA tenderness or guarding.  Musculoskeletal:     Right lower leg: No edema.     Left lower leg: No edema.  Skin:    General: Skin is warm and dry.  Neurological:     Mental Status: She is alert and oriented to person, place, and time.  Psychiatric:        Mood and Affect: Mood normal.        Behavior: Behavior normal.       Assessment &  Plan:  Tina Bailey is a 76 y.o. female . Frequent urination - Plan: Urine Culture  -  UTI that has improved with treatment with Cipro.  Repeat urine culture obtained to verify continued treatment with RTC precautions if worsening symptoms off antibiotic.  Depression with anxiety - Plan: busPIRone (BUSPAR) 10 MG tablet, hydrOXYzine (ATARAX) 10 MG tablet  -We will continue BuSpar and Cymbalta at same doses.  -Chronic use of hydrocodone, with 4 doses per day.  Risks of benzodiazepine and narcotic combination including risk of accidental overdose were discussed.  We will try low-dose hydroxyzine 10mg  half to 1 pill at bedtime or with anxiety flare with potential side effects discussed.  Recheck next few weeks.  She did express some concern of memory change over the past few months.  No acute changes.  Plan to discuss at follow-up visit with memory testing at that time.  RTC precautions if acute changes.  Meds ordered this encounter  Medications   busPIRone (BUSPAR) 10 MG tablet    Sig: TAKE 2 TABLETS BY MOUTH 3 TIMES DAILY.    Dispense:  540 tablet    Refill:  1   hydrOXYzine (ATARAX) 10 MG tablet    Sig: Take 0.5-1 tablets (5-10 mg total) by mouth 3 (three) times daily as needed for anxiety.    Dispense:  30 tablet    Refill:  0   Patient Instructions  You can try 1/2- 1 hydroxyzine instead of lorazepam. Let me know how that works. No other changes at this time.  If any concerns on urine test I will let you know.  I suspect the urine symptoms should continue to improve and resolve.  Let me know if otherwise.  We can discuss memory further at your next visit with some testing at that time.   Return to the clinic or go to the nearest emergency room if any of your symptoms worsen or new symptoms occur.       Signed,   Merri Ray, MD [place] 04/22/21 6:54 PM

## 2021-04-22 NOTE — Patient Instructions (Addendum)
You can try 1/2- 1 hydroxyzine instead of lorazepam. Let me know how that works. No other changes at this time.  If any concerns on urine test I will let you know.  I suspect the urine symptoms should continue to improve and resolve.  Let me know if otherwise.  We can discuss memory further at your next visit with some testing at that time.   Return to the clinic or go to the nearest emergency room if any of your symptoms worsen or new symptoms occur.

## 2021-04-23 ENCOUNTER — Other Ambulatory Visit (HOSPITAL_COMMUNITY): Payer: Self-pay | Admitting: Geriatric Medicine

## 2021-04-23 ENCOUNTER — Other Ambulatory Visit: Payer: Self-pay | Admitting: Geriatric Medicine

## 2021-04-23 DIAGNOSIS — R1010 Upper abdominal pain, unspecified: Secondary | ICD-10-CM

## 2021-04-24 LAB — URINE CULTURE
MICRO NUMBER:: 12958117
SPECIMEN QUALITY:: ADEQUATE

## 2021-05-03 ENCOUNTER — Other Ambulatory Visit: Payer: Self-pay | Admitting: Geriatric Medicine

## 2021-05-03 ENCOUNTER — Other Ambulatory Visit: Payer: Self-pay

## 2021-05-03 ENCOUNTER — Ambulatory Visit (HOSPITAL_COMMUNITY)
Admission: RE | Admit: 2021-05-03 | Discharge: 2021-05-03 | Disposition: A | Discharge status: Home / Self Care | Payer: Medicare HMO | Source: Ambulatory Visit | Attending: Geriatric Medicine | Admitting: Geriatric Medicine

## 2021-05-03 DIAGNOSIS — R1010 Upper abdominal pain, unspecified: Secondary | ICD-10-CM

## 2021-05-05 ENCOUNTER — Telehealth: Payer: Self-pay

## 2021-05-05 NOTE — Telephone Encounter (Signed)
-----   Message from Milus Banister, MD sent at 05/05/2021  3:39 PM EST ----- Regarding: RE: Follow up MRI No she does not. It is unchanged last year and it looks like she has changed her GI care to a [facility] gastroenterologist  Thanks  ----- Message ----- From: Stevan Born, [place]: 05/05/2021   3:35 PM EST To: Milus Banister, MD Subject: Follow up MRI                                  Dr Ardis Hughs,  I had sent myself a reminder to recheck MRI to look at pancreatic cyst from previous imagining in March 2021. You recommended a follow up MRI in March 2023.  Looks as if the patient had an MRI on 01-10-21 ordered by Cira Rue, NP.  Does she need another one this soon?  Thank you, Elmyra Ricks

## 2021-05-06 ENCOUNTER — Encounter: Payer: Self-pay | Admitting: Family Medicine

## 2021-05-06 DIAGNOSIS — R350 Frequency of micturition: Secondary | ICD-10-CM

## 2021-05-07 ENCOUNTER — Ambulatory Visit (INDEPENDENT_AMBULATORY_CARE_PROVIDER_SITE_OTHER): Payer: Medicare HMO | Admitting: Family Medicine

## 2021-05-07 DIAGNOSIS — N390 Urinary tract infection, site not specified: Secondary | ICD-10-CM

## 2021-05-07 DIAGNOSIS — R319 Hematuria, unspecified: Secondary | ICD-10-CM

## 2021-05-07 DIAGNOSIS — R350 Frequency of micturition: Secondary | ICD-10-CM | POA: Diagnosis not present

## 2021-05-07 LAB — POCT URINALYSIS DIP (MANUAL ENTRY)
Bilirubin, UA: NEGATIVE
Glucose, UA: NEGATIVE mg/dL
Nitrite, UA: NEGATIVE
Spec Grav, UA: 1.015 (ref 1.010–1.025)
Urobilinogen, UA: 0.2 E.U./dL
pH, UA: 7 (ref 5.0–8.0)

## 2021-05-07 MED ORDER — CIPROFLOXACIN HCL 250 MG PO TABS: 250.0000 mg | ORAL_TABLET | Freq: Two times a day (BID) | ORAL | 0 refills | Status: AC

## 2021-05-07 NOTE — Addendum Note (Signed)
Addended by: Merri Ray R on: 05/07/2021 06:41 PM   Modules accepted: Orders

## 2021-05-07 NOTE — Telephone Encounter (Signed)
Especially with potential recent infection, would recommend repeat urine testing with culture to make sure we are treating correct infection.  Can we have her get by here today to provide a specimen?

## 2021-05-07 NOTE — Telephone Encounter (Signed)
Would you recommend another visit to see if urine issue has cleared?

## 2021-05-07 NOTE — Progress Notes (Addendum)
Pt returned today for urine testing. She was treated for UTI over the last few days and had finished abx, but sxs returned.   POCT ordered as well as culture

## 2021-05-07 NOTE — Telephone Encounter (Signed)
Called patients daughter [name] to coordinate care. Reported has an appointment Monday and would like to check then, I did express concerns with waiting on recheck and expressed she should have Marlowe Kays seen at Urgent Care over the weekend if worsening, she expressed understanding of this concern and states she will try to coordinate with Marlowe Kays to get her here for a sample before end of day.

## 2021-05-09 LAB — URINE CULTURE
MICRO NUMBER:: 13026237
SPECIMEN QUALITY:: ADEQUATE

## 2021-05-10 ENCOUNTER — Telehealth: Payer: Self-pay | Admitting: Family Medicine

## 2021-05-10 NOTE — Telephone Encounter (Signed)
Called pt - spoke with Jannifer Hick - missed dose, but restarted at next usual dosing, back on track today - doing ok, no further concerns.

## 2021-05-10 NOTE — Telephone Encounter (Signed)
Follow up from Friday

## 2021-05-10 NOTE — Telephone Encounter (Signed)
Chief Complaint Medication Question (non symptomatic) Reason for Call Medication Question / Request Initial Comment Caller has medication question, she's taking Sipro which she's suppose to take twice a day but she only took 1. Caller asking should she double up this morning but she also takes omeprazole medication for anti acid and did not want to mix Translation No Nurse Assessment Nurse: Cox, RN, [facility] Date/Time (Eastern Time): 05/09/2021 10:15:24 AM Confirm and document reason for call. If symptomatic, describe symptoms. ---Caller states she is taking Cipro and took a pill at 2 PM. States she forgot to take medication last night. She is supposed to take twice a day but she only took 1. Caller asking should she double up this morning. Medication is treating a urinary tract infection Does the patient have any new or worsening symptoms? ---Yes Will a triage be completed? ---Yes Related visit to physician within the last 2 weeks? ---Yes Does the PT have any chronic conditions? (i.e. diabetes, asthma, this includes High risk factors for pregnancy, etc.) ---No Is this a behavioral health or substance abuse call? ---No Guidelines Guideline Title Affirmed Question Affirmed Notes Nurse Date/Time (Eastern Time) Urinary Tract Infection on Antibiotic Follow-up Call - Female [1] Taking antibiotic < 72 hours (3 days) for UTI AND [2] painful urination Cox, RN, [facility] 8/88/9169 45:03:88 AM PLEASE NOTE: All timestamps contained within this report are represented as [place] Standard Time. CONFIDENTIALTY NOTICE: This fax transmission is intended only for the addressee. It contains information that is legally privileged, confidential or otherwise protected from use or disclosure. If you are not the intended recipient, you are strictly prohibited from reviewing, disclosing, copying using or disseminating any of this information or taking any action in reliance on or regarding this  information. If you have received this fax in error, please notify us immediately by telephone so that we can arrange for its return to [place]. Phone: [phone], Toll-Free: [phone], Fax: [phone] Page: 2 of 2 Call Id: [id number] Guidelines Guideline Title Affirmed Question Affirmed Notes Nurse Date/Time Eilene Ghazi Time) or frequency not improved Disp. Time Eilene Ghazi Time) Disposition Final User 05/09/2021 10:22:14 AM Home Care Yes Cox, RN, [facility] Caller Disagree/Comply Comply Caller Understands Yes PreDisposition Call Pharmacist Care Advice Given Per Guideline HOME CARE: * You should be able to treat this at home. DRINK EXTRA FLUIDS - EXTRA NOTES AND WARNINGS: CALL BACK IF: * Fever lasts over 24 hours on antibiotics * You become worse CARE ADVICE given per Urinary Tract Infection on Antibiotic Follow-Up Call, Female (Adult) guideline. Comments User: Cordie Grice, RN Date/Time (Eastern Time): 05/09/2021 10:20:37 AM What happens if I miss a dose? If you take regular tablets or oral suspension: Take the medicine as soon as you can, but skip the missed dose if your next dose is due in less than 6 hours. If you take extended-release tablets: Take the medicine as soon as you can, but skip the missed dose if your next dose is due in less than 8 hours. Do not take two doses at one time. https://[email or web address].com/ciprofloxacin.html

## 2021-05-17 ENCOUNTER — Encounter: Payer: Self-pay | Admitting: Family Medicine

## 2021-05-17 ENCOUNTER — Ambulatory Visit (INDEPENDENT_AMBULATORY_CARE_PROVIDER_SITE_OTHER): Payer: Medicare HMO | Admitting: Family Medicine

## 2021-05-17 ENCOUNTER — Telehealth: Payer: Self-pay | Admitting: *Deleted

## 2021-05-17 VITALS — BP 164/82 | HR 102 | Temp 97.8°F | Resp 16 | Ht 61.0 in | Wt 128.4 lb

## 2021-05-17 DIAGNOSIS — N390 Urinary tract infection, site not specified: Secondary | ICD-10-CM

## 2021-05-17 DIAGNOSIS — F418 Other specified anxiety disorders: Secondary | ICD-10-CM

## 2021-05-17 DIAGNOSIS — R030 Elevated blood-pressure reading, without diagnosis of hypertension: Secondary | ICD-10-CM | POA: Diagnosis not present

## 2021-05-17 DIAGNOSIS — R413 Other amnesia: Secondary | ICD-10-CM

## 2021-05-17 DIAGNOSIS — R319 Hematuria, unspecified: Secondary | ICD-10-CM

## 2021-05-17 NOTE — Progress Notes (Signed)
Subjective:  Patient ID: Tina Bailey, female    DOB: March 15, 1946  Age: 76 y.o. MRN: [id number]  CC:  Chief Complaint  Patient presents with   Urinary Tract Infection    Pt was seen for nurse visit for UTI concerns, pt notes burning has resolved.    Anxiety    Pt has been taking hydroxyzine, notes has not had to use too often, has been trying to be mindful to help combat symptoms GAD7 =3   Memory Loss    Pt here to discuss memoty issues as follow up from 04/22/21    HPI Tina Bailey presents for   Urinary tract infection Strep agalactiae infection 2/17-  treated with Cipro d/t other abx allergies.  Prior symptoms have improved. Doing well currently.   Depression/Anxiety: Discussed 04/22/21. Concern with benzodiazepines discussed, especially with chronic use hydrocodone with chronic pain. Continued Cymbalta,  low dose hydroxyzine 5-10mg  discussed as alternative. Has been helpful on few times used - full pill.  Situational anxiety. Not taking lorazepam.  GAD 7 : Generalized Anxiety Score 05/17/2021 04/22/2021  Nervous, Anxious, on Edge 1 1  Control/stop worrying 0 0  Worry too much - different things 1 1  Trouble relaxing 0 0  Restless 1 0  Easily annoyed or irritable 0 0  Afraid - awful might happen 0 1  Total GAD 7 Score 3 3    Memory difficulty Here with daughter [name].  Some trouble with immediate recall of names past few years.  Forgets appointment at times. Per [name] - forgets thoughts mid sentence at times. Trouble keeping appts, acquiring responses since last summer - approx 7 months.  Dtr helping with bills past year.  Meals prepped at her living area, she has microwave if needed.  No assistance needed with bathing, dressing.  No hx of dementia known.  Neuropsych testing about 4 years ago was ok. Some normal signs of aging. No meds recommended.   History Patient Active Problem List   Diagnosis Date Noted   Exercise hypoxemia 08/01/2020   Personal history of  COVID-19 04/29/2020   History of peripheral edema 01/14/2019   Skin picking habit 12/02/2018   Thoracic aortic aneurysm without rupture 04/13/2018   Pulmonary hypertension, unspecified ([place]) 04/13/2018   Bilateral leg edema 04/13/2018   Severe episode of recurrent major depressive disorder, without psychotic features ([place]) 04/02/2018   Chronic diastolic heart failure (HCC)    Hyponatremia 02/28/2018   COPD (chronic obstructive pulmonary disease) ([place]) 02/28/2018   Tobacco abuse 02/28/2018   Acute blood loss anemia 12/06/2017   Thrombocytopenia ([place]) 12/06/2017   Severe protein-calorie malnutrition ([place]) 12/05/2017   Closed comminuted intertrochanteric fracture of proximal end of right femur ([place]) 12/04/2017   Chronic thoracic back pain 03/31/2017   Physical exam 01/09/2017   Malnutrition of moderate degree 12/22/2016   Anxiety about health 09/27/2016   Osteoporosis 07/13/2016   Chronic narcotic use 07/13/2016   Constipation 04/26/2015   Chronic pain 04/26/2015   Depression with anxiety 04/26/2015   COPD GOLD IV /  02 dep with exertion 03/03/2015   Hepatocellular carcinoma ([place]) 12/30/2014   Pancreatic mass 12/04/2014   Blue toe syndrome ([place]) 11/27/2014   Past Medical History:  Diagnosis Date   Arthritis    DDD. Right shoulder"is frozen"-limited ROM. osteoporosis.   Blue toe syndrome ([place]) 11/27/2014   Dr. Claudia Pollock evaluating   CHF (congestive heart failure) (HCC)    Complication of anesthesia    COPD (chronic obstructive pulmonary disease) ([place])  Fibromyalgia    Fracture of rib of right side    hx "osteoporosis"- states her dog nudge her on the side, next day developed great pain and was told has a fracture rib.   Liver cancer ([place]) 12/2014   Macular degeneration    R eye   Osteoporosis    PONV (postoperative nausea and vomiting)    nausea,severe vomiting after 01-13-15 portal vein embolization   Productive cough    Retina disorder    L eye, vision distorted,  edema   Spine fracture due to birth trauma    TMJ disease    Wears glasses    Past Surgical History:  Procedure Laterality Date   ANGIOPLASTY  2008   no stents required, no follow-up with cardiologist, no recurrent chest pain   CATARACT EXTRACTION, BILATERAL Bilateral    ESOPHAGOGASTRODUODENOSCOPY (EGD) WITH PROPOFOL N/A 06/25/2015   Procedure: ESOPHAGOGASTRODUODENOSCOPY (EGD) WITH PROPOFOL;  Surgeon: Milus Banister, MD;  Location: [facility];  Service: Endoscopy;  Laterality: N/A;  stent removal    EYE SURGERY     cornea surgery   INTRAMEDULLARY (IM) NAIL INTERTROCHANTERIC Right 12/04/2017   Procedure: INTRAMEDULLARY (IM) NAIL INTERTROCHANTRIC;  Surgeon: Rod Can, MD;  Location: [facility];  Service: Orthopedics;  Laterality: Right;   IR RADIOLOGIST EVAL & MGMT  03/29/2017   KIDNEY STONE SURGERY     LAPAROSCOPIC PARTIAL HEPATECTOMY N/A 03/04/2015   Procedure: DIAGNOSTIC LAPAROSCOPY, EXTENDED RIGHT HEPATECTOMY, WITH INTRAOPERATIVE ULTRASOUND;  Surgeon: Stark Klein, MD;  Location: [facility];  Service: General;  Laterality: N/A;   PARTIAL HYSTERECTOMY     portal vein embolization     01-13-15 -Dr. Cathlean Sauer.   Allergies  Allergen Reactions   Zoloft [Sertraline Hcl]     Zoloft, had a terrible reaction   Amoxicillin-Pot Clavulanate Nausea Only    Amoxacillin Augmentin combo  Has patient had a PCN reaction causing immediate rash, facial/tongue/throat swelling, SOB or lightheadedness with hypotension: No Has patient had a PCN reaction causing severe rash involving mucus membranes or skin necrosis: No Has patient had a PCN reaction that required hospitalization No Has patient had a PCN reaction occurring within the last 10 years: No If all of the above answers are "NO", then may proceed with Cephalosporin use.    Clindamycin/Lincomycin Other (See Comments)    Felt like it "burned out" her stomach/took a large dose   Paxil [Paroxetine Hcl]    Paroxetine Hcl Rash   Sulfa  Antibiotics Rash   Prior to Admission medications   Medication Sig Start Date End Date Taking? Authorizing Provider  acetaminophen (TYLENOL) 325 MG tablet Take 2 tablets (650 mg total) by mouth every 6 (six) hours as needed for mild pain (or Fever >/= 101). 03/06/18   Florencia Reasons, MD  albuterol (PROVENTIL) (2.5 MG/3ML) 0.083% nebulizer solution Take 3 mLs (2.5 mg total) by nebulization every 4 (four) hours as needed for wheezing or shortness of breath. 02/25/21   Tanda Rockers, MD  aspirin-acetaminophen-caffeine (EXCEDRIN MIGRAINE) [phone] MG tablet Take by mouth every 6 (six) hours as needed for headache.    [provider]  Bismuth Subsalicylate 503 UU/82CM SUSP Take 30 mLs by mouth every 8 (eight) hours as needed.    [provider]  Budeson-Glycopyrrol-Formoterol (BREZTRI AEROSPHERE) 160-9-4.8 MCG/ACT AERO Inhale 2 puffs into the lungs in the morning and at bedtime. 11/02/20   Tanda Rockers, MD  busPIRone (BUSPAR) 10 MG tablet TAKE 2 TABLETS BY MOUTH 3 TIMES DAILY. 04/22/21  Wendie Agreste, MD  docusate sodium (COLACE) 100 MG capsule Take 100 mg by mouth 2 (two) times daily.    [provider]  DULoxetine (CYMBALTA) 30 MG capsule Take 1 tablet by mouth daily.    [provider]  HYDROcodone-acetaminophen (NORCO) 10-325 MG tablet Take 1 tablet by mouth in the morning, at noon, in the evening, and at bedtime.    [provider]  hydrOXYzine (ATARAX) 10 MG tablet Take 0.5-1 tablets (5-10 mg total) by mouth 3 (three) times daily as needed for anxiety. 04/22/21   Wendie Agreste, MD  LINZESS 72 MCG capsule Take 72 mcg by mouth every morning. 01/13/21   [provider]  LORazepam (ATIVAN) 0.5 MG tablet Take 0.5 mg by mouth as needed. 05/20/19   [provider]  melatonin 5 MG TABS melatonin 5 mg tablet  Take 1 tablet every day by oral route at bedtime.    [provider]  promethazine (PHENERGAN) 12.5 MG tablet Take 12.5 mg by  mouth every 6 (six) hours as needed for nausea or vomiting.    [provider]   Social History   Socioeconomic History   Marital status: Divorced    Spouse name: Not on file   Number of children: 3   Years of education: Not on file   Highest education level: Not on file  Occupational History   Occupation: retired  Tobacco Use   Smoking status: Former    Packs/day: 1.00    Years: 45.00    Pack years: 45.00    Types: Cigarettes    Quit date: 02/20/2018    Years since quitting: 3.2   Smokeless tobacco: Never  Vaping Use   Vaping Use: Never used  Substance and Sexual Activity   Alcohol use: No    Alcohol/week: 0.0 standard drinks   Drug use: No   Sexual activity: Not Currently  Other Topics Concern   Not on file  Social History Narrative   Not on file   Social Determinants of Health   Financial Resource Strain: Not on file  Food Insecurity: Not on file  Transportation Needs: Not on file  Physical Activity: Not on file  Stress: Not on file  Social Connections: Not on file  Intimate Partner Violence: Not on file    Review of Systems Per HPI.   Objective:   Vitals:   05/17/21 0956 05/17/21 1110  BP: (!) 162/80 (!) 164/82  Pulse: (!) 102   Resp: 16   Temp: 97.8 F (36.6 C)   TempSrc: Temporal   SpO2: 90%   Weight: 128 lb 6.4 oz (58.2 kg)   Height: 5\' 1"  (1.549 m)    BP Readings from Last 3 Encounters:  05/17/21 (!) 164/82  04/22/21 128/78  03/01/21 122/74     Physical Exam Constitutional:      General: She is not in acute distress.    Appearance: Normal appearance. She is well-developed.  HENT:     Head: Normocephalic and atraumatic.  Cardiovascular:     Rate and Rhythm: Normal rate.  Pulmonary:     Effort: Pulmonary effort is normal.  Abdominal:     General: Abdomen is flat. Bowel sounds are normal.     Tenderness: There is no abdominal tenderness. There is no right CVA tenderness or left CVA tenderness.  Neurological:     Mental  Status: She is alert and oriented to person, place, and time.  Psychiatric:  Mood and Affect: Mood normal.        Behavior: Behavior normal.   MOCA memory test: Score 25 out of 30.  -1 on attention, -2 on serial sevens, -1 on language, -1 on delayed recall.  38 minutes spent during visit, including chart review, counseling and assimilation of information, exam, discussion of MOCA results, plan, and chart completion.    Assessment & Plan:  Tina Bailey is a 76 y.o. female . Urinary tract infection with hematuria, site unspecified  -Symptoms resolved, RTC precautions if recurrence of symptoms for further testing, culture given possible previous contaminant versus infection.  Depression with anxiety  -Overall stable off benzodiazepine.  Plans to continue to avoid.  Low-dose hydroxyzine only needed and handout given on management of anxiety symptoms to potentially lessen need for medication.  Continue Cymbalta.  Memory changes - Plan: Ambulatory referral to Neurology  -Borderline low MoCA testing.  Will refer to neurology to discuss further testing versus continued monitoring.  Hold on new meds at this time.  Elevated blood pressure reading  -Better readings on previous visits.  Plan for home monitoring.  I did receive the message after her visit was completed that her eye provider had noted 2 areas of hemorrhage on the right eye and requested lab work/evaluation for diabetes, hyperlipidemia, hypertension.  Given this additional information I will request a visit in 1 week with close home monitoring of blood pressures and follow-up sooner if they remain elevated at home.  Can perform labs at next visit.  No orders of the defined types were placed in this encounter.  Patient Instructions  See info below on ways to manage anxiety. Use hydoxyzine only if needed. Follow up if you need med more often.   Follow up if return of urinary symptoms.   Memory test was borderline low today.   I am happy to refer you to neurology to discuss the symptoms and these results further to see if they recommend other testing or continued monitoring.  Based on test results if there is some cognitive impairment it is mild.  Blood pressure was slightly elevated in the office today.  Please check that at home and if you consistently have readings above 140/90 please return to discuss medications/changes.   Managing Anxiety, Adult After being diagnosed with anxiety, you may be relieved to know why you have felt or behaved a certain way. You may also feel overwhelmed about the treatment ahead and what it will mean for your life. With care and support, you can manage this condition. How to manage lifestyle changes Managing stress and anxiety Stress is your body's reaction to life changes and events, both good and bad. Most stress will last just a few hours, but stress can be ongoing and can lead to more than just stress. Although stress can play a major role in anxiety, it is not the same as anxiety. Stress is usually caused by something external, such as a deadline, test, or competition. Stress normally passes after the triggering event has ended.  Anxiety is caused by something internal, such as imagining a terrible outcome or worrying that something will go wrong that will devastate you. Anxiety often does not go away even after the triggering event is over, and it can become long-term (chronic) worry. It is important to understand the differences between stress and anxiety and to manage your stress effectively so that it does not lead to an anxious response. Talk with your health care provider or a counselor  to learn more about reducing anxiety and stress. He or she may suggest tension reduction techniques, such as: Music therapy. Spend time creating or listening to music that you enjoy and that inspires you. Mindfulness-based meditation. Practice being aware of your normal breaths while not trying to  control your breathing. It can be done while sitting or walking. Centering prayer. This involves focusing on a word, phrase, or sacred image that means something to you and brings you peace. Deep breathing. To do this, expand your stomach and inhale slowly through your nose. Hold your breath for 3-5 seconds. Then exhale slowly, letting your stomach muscles relax. Self-talk. Learn to notice and identify thought patterns that lead to anxiety reactions and change those patterns to thoughts that feel peaceful. Muscle relaxation. Taking time to tense muscles and then relax them. Choose a tension reduction technique that fits your lifestyle and personality. These techniques take time and practice. Set aside 5-15 minutes a day to do them. Therapists can offer counseling and training in these techniques. The training to help with anxiety may be covered by some insurance plans. Other things you can do to manage stress and anxiety include: Keeping a stress diary. This can help you learn what triggers your reaction and then learn ways to manage your response. Thinking about how you react to certain situations. You may not be able to control everything, but you can control your response. Making time for activities that help you relax and not feeling guilty about spending your time in this way. Doing visual imagery. This involves imagining or creating mental pictures to help you relax. Practicing yoga. Through yoga poses, you can lower tension and promote relaxation.  Medicines Medicines can help ease symptoms. Medicines for anxiety include: Antidepressant medicines. These are usually prescribed for long-term daily control. Anti-anxiety medicines. These may be added in severe cases, especially when panic attacks occur. Medicines will be prescribed by a health care provider. When used together, medicines, psychotherapy, and tension reduction techniques may be the most effective  treatment. Relationships Relationships can play a big part in helping you recover. Try to spend more time connecting with trusted friends and family members. Consider going to couples counseling if you have a partner, taking family education classes, or going to family therapy. Therapy can help you and others better understand your condition. How to recognize changes in your anxiety Everyone responds differently to treatment for anxiety. Recovery from anxiety happens when symptoms decrease and stop interfering with your daily activities at home or work. This may mean that you will start to: Have better concentration and focus. Worry will interfere less in your daily thinking. Sleep better. Be less irritable. Have more energy. Have improved memory. It is also important to recognize when your condition is getting worse. Contact your health care provider if your symptoms interfere with home or work and you feel like your condition is not improving. Follow these instructions at home: Activity Exercise. Adults should do the following: Exercise for at least 150 minutes each week. The exercise should increase your heart rate and make you sweat (moderate-intensity exercise). Strengthening exercises at least twice a week. Get the right amount and quality of sleep. Most adults need 7-9 hours of sleep each night. Lifestyle  Eat a healthy diet that includes plenty of vegetables, fruits, whole grains, low-fat dairy products, and lean protein. Do not eat a lot of foods that are high in fats, added sugars, or salt (sodium). Make choices that simplify your life. Do not  use any products that contain nicotine or tobacco. These products include cigarettes, chewing tobacco, and vaping devices, such as e-cigarettes. If you need help quitting, ask your health care provider. Avoid caffeine, alcohol, and certain over-the-counter cold medicines. These may make you feel worse. Ask your pharmacist which medicines to  avoid. General instructions Take over-the-counter and prescription medicines only as told by your health care provider. Keep all follow-up visits. This is important. Where to find support You can get help and support from these sources: Self-help groups. Online and [employer]. A trusted spiritual leader. Couples counseling. Family education classes. Family therapy. Where to find more information You may find that joining a support group helps you deal with your anxiety. The following sources can help you locate counselors or support groups near you: [place]: www.mentalhealthamerica.net Anxiety and Depression Association of [place] (ADAA): https://www.clark.net/ National Alliance on Mental Illness (NAMI): www.nami.org Contact a health care provider if: You have a hard time staying focused or finishing daily tasks. You spend many hours a day feeling worried about everyday life. You become exhausted by worry. You start to have headaches or frequently feel tense. You develop chronic nausea or diarrhea. Get help right away if: You have a racing heart and shortness of breath. You have thoughts of hurting yourself or others. If you ever feel like you may hurt yourself or others, or have thoughts about taking your own life, get help right away. Go to your nearest emergency department or: Call your local emergency services (911 in the [place] Call a suicide crisis helpline, such as the [place] at (510)249-4901 or 988 in the [place]. This is open 24 hours a day in the [place] Text the Crisis Text Line at 6064532519 (in the [place].). Summary Taking steps to learn and use tension reduction techniques can help calm you and help prevent triggering an anxiety reaction. When used together, medicines, psychotherapy, and tension reduction techniques may be the most effective treatment. Family, friends, and partners can play a big part in supporting you. This  information is not intended to replace advice given to you by your health care provider. Make sure you discuss any questions you have with your health care provider. Document Revised: 09/30/2020 Document Reviewed: 06/28/2020 Elsevier Patient Education  2022 [place],   Merri Ray, MD [place] 05/17/21 11:15 AM

## 2021-05-17 NOTE — Telephone Encounter (Signed)
Noted.  Patient was seen earlier today for unrelated issues.  Blood pressure was slightly elevated at that time.  Advised to check blood pressures at home.  Lets have her follow-up in 1 week for repeat blood pressure and fasting labs at that time.  Please advise patient/family of plan and recommendations from her eye specialist.  Follow-up sooner if elevated home blood pressure readings.

## 2021-05-17 NOTE — Telephone Encounter (Signed)
Dr Wynetta Emery from [facility] is asking that you order these labs for this pt to have performed Please advise

## 2021-05-17 NOTE — Telephone Encounter (Signed)
Tina Bailey calling from [facility] in [place] with [name]. She states that patient's eye exam found two hemmorage in OD. Dr. Wynetta Emery is requesting PCP order the labs below as this is her "good eye" and they do not want her to go blind in it. Please advise patient when/if labs are approved and notify when results come in.   Lab work for:  Diabetes Cholesterol  High BP

## 2021-05-17 NOTE — Patient Instructions (Addendum)
See info below on ways to manage anxiety. Use hydoxyzine only if needed. Follow up if you need med more often.   Follow up if return of urinary symptoms.   Memory test was borderline low today.  I am happy to refer you to neurology to discuss the symptoms and these results further to see if they recommend other testing or continued monitoring.  Based on test results if there is some cognitive impairment it is mild.  Blood pressure was slightly elevated in the office today.  Please check that at home and if you consistently have readings above 140/90 please return to discuss medications/changes.   Managing Anxiety, Adult After being diagnosed with anxiety, you may be relieved to know why you have felt or behaved a certain way. You may also feel overwhelmed about the treatment ahead and what it will mean for your life. With care and support, you can manage this condition. How to manage lifestyle changes Managing stress and anxiety Stress is your body's reaction to life changes and events, both good and bad. Most stress will last just a few hours, but stress can be ongoing and can lead to more than just stress. Although stress can play a major role in anxiety, it is not the same as anxiety. Stress is usually caused by something external, such as a deadline, test, or competition. Stress normally passes after the triggering event has ended.  Anxiety is caused by something internal, such as imagining a terrible outcome or worrying that something will go wrong that will devastate you. Anxiety often does not go away even after the triggering event is over, and it can become long-term (chronic) worry. It is important to understand the differences between stress and anxiety and to manage your stress effectively so that it does not lead to an anxious response. Talk with your health care provider or a counselor to learn more about reducing anxiety and stress. He or she may suggest tension reduction techniques,  such as: Music therapy. Spend time creating or listening to music that you enjoy and that inspires you. Mindfulness-based meditation. Practice being aware of your normal breaths while not trying to control your breathing. It can be done while sitting or walking. Centering prayer. This involves focusing on a word, phrase, or sacred image that means something to you and brings you peace. Deep breathing. To do this, expand your stomach and inhale slowly through your nose. Hold your breath for 3-5 seconds. Then exhale slowly, letting your stomach muscles relax. Self-talk. Learn to notice and identify thought patterns that lead to anxiety reactions and change those patterns to thoughts that feel peaceful. Muscle relaxation. Taking time to tense muscles and then relax them. Choose a tension reduction technique that fits your lifestyle and personality. These techniques take time and practice. Set aside 5-15 minutes a day to do them. Therapists can offer counseling and training in these techniques. The training to help with anxiety may be covered by some insurance plans. Other things you can do to manage stress and anxiety include: Keeping a stress diary. This can help you learn what triggers your reaction and then learn ways to manage your response. Thinking about how you react to certain situations. You may not be able to control everything, but you can control your response. Making time for activities that help you relax and not feeling guilty about spending your time in this way. Doing visual imagery. This involves imagining or creating mental pictures to help you relax. Practicing yoga.  Through yoga poses, you can lower tension and promote relaxation.  Medicines Medicines can help ease symptoms. Medicines for anxiety include: Antidepressant medicines. These are usually prescribed for long-term daily control. Anti-anxiety medicines. These may be added in severe cases, especially when panic attacks  occur. Medicines will be prescribed by a health care provider. When used together, medicines, psychotherapy, and tension reduction techniques may be the most effective treatment. Relationships Relationships can play a big part in helping you recover. Try to spend more time connecting with trusted friends and family members. Consider going to couples counseling if you have a partner, taking family education classes, or going to family therapy. Therapy can help you and others better understand your condition. How to recognize changes in your anxiety Everyone responds differently to treatment for anxiety. Recovery from anxiety happens when symptoms decrease and stop interfering with your daily activities at home or work. This may mean that you will start to: Have better concentration and focus. Worry will interfere less in your daily thinking. Sleep better. Be less irritable. Have more energy. Have improved memory. It is also important to recognize when your condition is getting worse. Contact your health care provider if your symptoms interfere with home or work and you feel like your condition is not improving. Follow these instructions at home: Activity Exercise. Adults should do the following: Exercise for at least 150 minutes each week. The exercise should increase your heart rate and make you sweat (moderate-intensity exercise). Strengthening exercises at least twice a week. Get the right amount and quality of sleep. Most adults need 7-9 hours of sleep each night. Lifestyle  Eat a healthy diet that includes plenty of vegetables, fruits, whole grains, low-fat dairy products, and lean protein. Do not eat a lot of foods that are high in fats, added sugars, or salt (sodium). Make choices that simplify your life. Do not use any products that contain nicotine or tobacco. These products include cigarettes, chewing tobacco, and vaping devices, such as e-cigarettes. If you need help quitting, ask  your health care provider. Avoid caffeine, alcohol, and certain over-the-counter cold medicines. These may make you feel worse. Ask your pharmacist which medicines to avoid. General instructions Take over-the-counter and prescription medicines only as told by your health care provider. Keep all follow-up visits. This is important. Where to find support You can get help and support from these sources: Self-help groups. Online and [employer]. A trusted spiritual leader. Couples counseling. Family education classes. Family therapy. Where to find more information You may find that joining a support group helps you deal with your anxiety. The following sources can help you locate counselors or support groups near you: [place]: www.mentalhealth[place].net Anxiety and Depression Association of [place] (ADAA): https://www.clark.net/ National Alliance on Mental Illness (NAMI): www.nami.org Contact a health care provider if: You have a hard time staying focused or finishing daily tasks. You spend many hours a day feeling worried about everyday life. You become exhausted by worry. You start to have headaches or frequently feel tense. You develop chronic nausea or diarrhea. Get help right away if: You have a racing heart and shortness of breath. You have thoughts of hurting yourself or others. If you ever feel like you may hurt yourself or others, or have thoughts about taking your own life, get help right away. Go to your nearest emergency department or: Call your local emergency services (911 in the [place] Call a suicide crisis helpline, such as the [employer] at 631-743-7419  or 988 in the [place] This is open 24 hours a day in the [place] Text the Crisis Text Line at 541 762 5752 (in the [place].). Summary Taking steps to learn and use tension reduction techniques can help calm you and help prevent triggering an anxiety reaction. When used together, medicines,  psychotherapy, and tension reduction techniques may be the most effective treatment. Family, friends, and partners can play a big part in supporting you. This information is not intended to replace advice given to you by your health care provider. Make sure you discuss any questions you have with your health care provider. Document Revised: 09/30/2020 Document Reviewed: 06/28/2020 Elsevier Patient Education  [employer].

## 2021-05-18 NOTE — Telephone Encounter (Signed)
Pt is scheduled °

## 2021-05-19 ENCOUNTER — Encounter (HOSPITAL_BASED_OUTPATIENT_CLINIC_OR_DEPARTMENT_OTHER): Payer: Self-pay

## 2021-05-19 NOTE — Telephone Encounter (Signed)
Please advise for BP management ?

## 2021-05-20 ENCOUNTER — Encounter: Payer: Self-pay | Admitting: Family Medicine

## 2021-05-20 ENCOUNTER — Other Ambulatory Visit: Payer: Self-pay | Admitting: Pharmacist Clinician (PhC)/ Clinical Pharmacy Specialist

## 2021-05-20 MED ORDER — AMLODIPINE BESYLATE 2.5 MG PO TABS: 2.5000 mg | ORAL_TABLET | Freq: Every day | ORAL | 3 refills | Status: DC

## 2021-05-20 NOTE — Telephone Encounter (Signed)
Pt daughter sent FYI from cardiologist about restarting amlodipine, anything to add? ? ?

## 2021-05-24 ENCOUNTER — Ambulatory Visit (INDEPENDENT_AMBULATORY_CARE_PROVIDER_SITE_OTHER): Payer: Medicare HMO | Admitting: Family Medicine

## 2021-05-24 VITALS — BP 150/80 | HR 74 | Temp 97.9°F | Resp 16 | Ht 61.0 in | Wt 126.0 lb

## 2021-05-24 DIAGNOSIS — Z131 Encounter for screening for diabetes mellitus: Secondary | ICD-10-CM | POA: Diagnosis not present

## 2021-05-24 DIAGNOSIS — I10 Essential (primary) hypertension: Secondary | ICD-10-CM

## 2021-05-24 DIAGNOSIS — Z13220 Encounter for screening for lipoid disorders: Secondary | ICD-10-CM | POA: Diagnosis not present

## 2021-05-24 LAB — COMPREHENSIVE METABOLIC PANEL
ALT: 11 U/L (ref 0–35)
AST: 16 U/L (ref 0–37)
Albumin: 4.5 g/dL (ref 3.5–5.2)
Alkaline Phosphatase: 65 U/L (ref 39–117)
BUN: 13 mg/dL (ref 6–23)
CO2: 33 mEq/L — ABNORMAL HIGH (ref 19–32)
Calcium: 9.3 mg/dL (ref 8.4–10.5)
Chloride: 93 mEq/L — ABNORMAL LOW (ref 96–112)
Creatinine, Ser: 0.54 mg/dL (ref 0.40–1.20)
GFR: 89.52 mL/min (ref >60.00)
Glucose, Bld: 91 mg/dL (ref 70–99)
Potassium: 4.3 mEq/L (ref 3.5–5.1)
Sodium: 134 mEq/L — ABNORMAL LOW (ref 135–145)
Total Bilirubin: 0.6 mg/dL (ref 0.2–1.2)
Total Protein: 7 g/dL (ref 6.0–8.3)

## 2021-05-24 LAB — HEMOGLOBIN A1C: Hgb A1c MFr Bld: 6.2 % (ref 4.6–6.5)

## 2021-05-24 LAB — LIPID PANEL
Cholesterol: 190 mg/dL (ref 0–200)
HDL: 90.8 mg/dL (ref >39.00)
LDL Cholesterol: 86 mg/dL (ref 0–99)
NonHDL: 98.72
Total CHOL/HDL Ratio: 2
Triglycerides: 65 mg/dL (ref 0.0–149.0)
VLDL: 13 mg/dL (ref 0.0–40.0)

## 2021-05-24 NOTE — Patient Instructions (Signed)
I would recommend discussing further changes on your blood pressure med with cardiology given occasional lightheadedness but also bleeding noted by your optometrist.  ?

## 2021-05-24 NOTE — Progress Notes (Signed)
Subjective:  Patient ID: Tina Bailey, female    DOB: 1945/08/21  Age: 76 y.o. MRN: [id number]  CC:  Chief Complaint  Patient presents with   Hypertension    Pt here for blood pressure check, no concern from pt, eye dr had called concerned about pressure     HPI Tina Bailey presents for   Hypertension: Some increased readings, concern from her eye provider after her last visit that 2 hemorrhages were found in the right eye.  Recommended lab work for diabetes, hyperlipidemia, elevated blood pressure.  Cardiology office started amlodipine 2.5 mg on March 2.  Taking daily - no new side effects, rare lightheadedness.  Fasting today.  Home readings: 140-158/80.  Down form the 160's. Cardiology has allowed 150's in past.  Min HA, no focal weakness, no slurred speech, no CP/dyspnea.  BP Readings from Last 3 Encounters:  05/24/21 (!) 150/80  05/17/21 (!) 164/82  04/22/21 128/78   Lab Results  Component Value Date   CREATININE 0.61 01/08/2021   No results found for: HGBA1C Lab Results  Component Value Date   CHOL 188 01/09/2017   HDL 91.40 01/09/2017   LDLCALC 82 01/09/2017   TRIG 71.0 01/09/2017   CHOLHDL 2 01/09/2017     History Patient Active Problem List   Diagnosis Date Noted   Exercise hypoxemia 08/01/2020   Personal history of COVID-19 04/29/2020   History of peripheral edema 01/14/2019   Skin picking habit 12/02/2018   Thoracic aortic aneurysm without rupture 04/13/2018   Pulmonary hypertension, unspecified (HCC) 04/13/2018   Bilateral leg edema 04/13/2018   Severe episode of recurrent major depressive disorder, without psychotic features (HCC) 04/02/2018   Chronic diastolic heart failure (HCC)    Hyponatremia 02/28/2018   COPD (chronic obstructive pulmonary disease) (HCC) 02/28/2018   Tobacco abuse 02/28/2018   Acute blood loss anemia 12/06/2017   Thrombocytopenia (HCC) 12/06/2017   Severe protein-calorie malnutrition (HCC) 12/05/2017   Closed  comminuted intertrochanteric fracture of proximal end of right femur (HCC) 12/04/2017   Chronic thoracic back pain 03/31/2017   Physical exam 01/09/2017   Malnutrition of moderate degree 12/22/2016   Anxiety about health 09/27/2016   Osteoporosis 07/13/2016   Chronic narcotic use 07/13/2016   Constipation 04/26/2015   Chronic pain 04/26/2015   Depression with anxiety 04/26/2015   COPD GOLD IV /  02 dep with exertion 03/03/2015   Hepatocellular carcinoma (HCC) 12/30/2014   Pancreatic mass 12/04/2014   Blue toe syndrome (HCC) 11/27/2014   Past Medical History:  Diagnosis Date   Arthritis    DDD. Right shoulder"is frozen"-limited ROM. osteoporosis.   Blue toe syndrome (HCC) 11/27/2014   Dr. Isaac Laud evaluating   CHF (congestive heart failure) (HCC)    Complication of anesthesia    COPD (chronic obstructive pulmonary disease) (HCC)    Fibromyalgia    Fracture of rib of right side    hx "osteoporosis"- states her dog nudge her on the side, next day developed great pain and was told has a fracture rib.   Liver cancer (HCC) 12/2014   Macular degeneration    R eye   Osteoporosis    PONV (postoperative nausea and vomiting)    nausea,severe vomiting after 01-13-15 portal vein embolization   Productive cough    Retina disorder    L eye, vision distorted, edema   Spine fracture due to birth trauma    TMJ disease    Wears glasses    Past Surgical History:  Procedure Laterality  Date   ANGIOPLASTY  2008   no stents required, no follow-up with cardiologist, no recurrent chest pain   CATARACT EXTRACTION, BILATERAL Bilateral    ESOPHAGOGASTRODUODENOSCOPY (EGD) WITH PROPOFOL N/A 06/25/2015   Procedure: ESOPHAGOGASTRODUODENOSCOPY (EGD) WITH PROPOFOL;  Surgeon: Rachael Fee, MD;  Location: [facility];  Service: Endoscopy;  Laterality: N/A;  stent removal    EYE SURGERY     cornea surgery   INTRAMEDULLARY (IM) NAIL INTERTROCHANTERIC Right 12/04/2017   Procedure: INTRAMEDULLARY (IM)  NAIL INTERTROCHANTRIC;  Surgeon: Samson Frederic, MD;  Location: [facility];  Service: Orthopedics;  Laterality: Right;   IR RADIOLOGIST EVAL & MGMT  03/29/2017   KIDNEY STONE SURGERY     LAPAROSCOPIC PARTIAL HEPATECTOMY N/A 03/04/2015   Procedure: DIAGNOSTIC LAPAROSCOPY, EXTENDED RIGHT HEPATECTOMY, WITH INTRAOPERATIVE ULTRASOUND;  Surgeon: Almond Lint, MD;  Location: [facility];  Service: General;  Laterality: N/A;   PARTIAL HYSTERECTOMY     portal vein embolization     01-13-15 -Dr. Jerolyn Shin.   Allergies  Allergen Reactions   Zoloft [Sertraline Hcl]     Zoloft, had a terrible reaction   Amoxicillin-Pot Clavulanate Nausea Only    Amoxacillin Augmentin combo  Has patient had a PCN reaction causing immediate rash, facial/tongue/throat swelling, SOB or lightheadedness with hypotension: No Has patient had a PCN reaction causing severe rash involving mucus membranes or skin necrosis: No Has patient had a PCN reaction that required hospitalization No Has patient had a PCN reaction occurring within the last 10 years: No If all of the above answers are "NO", then may proceed with Cephalosporin use.    Clindamycin/Lincomycin Other (See Comments)    Felt like it "burned out" her stomach/took a large dose   Paxil [Paroxetine Hcl]    Paroxetine Hcl Rash   Sulfa Antibiotics Rash   Prior to Admission medications   Medication Sig Start Date End Date Taking? Authorizing Provider  acetaminophen (TYLENOL) 325 MG tablet Take 2 tablets (650 mg total) by mouth every 6 (six) hours as needed for mild pain (or Fever >/= 101). 03/06/18  Yes Albertine Grates, MD  albuterol (PROVENTIL) (2.5 MG/3ML) 0.083% nebulizer solution Take 3 mLs (2.5 mg total) by nebulization every 4 (four) hours as needed for wheezing or shortness of breath. 02/25/21  Yes Nyoka Cowden, MD  amLODipine (NORVASC) 2.5 MG tablet Take 1 tablet (2.5 mg total) by mouth daily. 05/20/21  Yes Jodelle Red, MD  aspirin-acetaminophen-caffeine  [facility] MIGRAINE) [phone] MG tablet Take by mouth every 6 (six) hours as needed for headache.   Yes [provider]  Bismuth Subsalicylate 525 MG/15ML SUSP Take 30 mLs by mouth every 8 (eight) hours as needed.   Yes [provider]  Budeson-Glycopyrrol-Formoterol (BREZTRI AEROSPHERE) 160-9-4.8 MCG/ACT AERO Inhale 2 puffs into the lungs in the morning and at bedtime. 11/02/20  Yes Nyoka Cowden, MD  busPIRone (BUSPAR) 10 MG tablet TAKE 2 TABLETS BY MOUTH 3 TIMES DAILY. 04/22/21  Yes Shade Flood, MD  docusate sodium (COLACE) 100 MG capsule Take 100 mg by mouth 2 (two) times daily.   Yes [provider]  DULoxetine (CYMBALTA) 30 MG capsule Take 1 tablet by mouth daily.   Yes [provider]  HYDROcodone-acetaminophen (NORCO) 10-325 MG tablet Take 1 tablet by mouth in the morning, at noon, in the evening, and at bedtime.   Yes [provider]  hydrOXYzine (ATARAX) 10 MG tablet Take 0.5-1 tablets (5-10 mg total) by mouth 3 (three) times daily as needed for  anxiety. 04/22/21  Yes Shade Flood, MD  LINZESS 72 MCG capsule Take 72 mcg by mouth every morning. 01/13/21  Yes [provider]  LORazepam (ATIVAN) 0.5 MG tablet Take 0.5 mg by mouth as needed. 05/20/19  Yes [provider]  melatonin 5 MG TABS melatonin 5 mg tablet  Take 1 tablet every day by oral route at bedtime.   Yes [provider]  promethazine (PHENERGAN) 12.5 MG tablet Take 12.5 mg by mouth every 6 (six) hours as needed for nausea or vomiting.   Yes [provider]   Social History   Socioeconomic History   Marital status: Divorced    Spouse name: Not on file   Number of children: 3   Years of education: Not on file   Highest education level: Not on file  Occupational History   Occupation: retired  Tobacco Use   Smoking status: Former    Packs/day: 1.00    Years: 45.00    Pack years: 45.00    Types: Cigarettes    Quit date: 02/20/2018     Years since quitting: 3.2   Smokeless tobacco: Never  Vaping Use   Vaping Use: Never used  Substance and Sexual Activity   Alcohol use: No    Alcohol/week: 0.0 standard drinks   Drug use: No   Sexual activity: Not Currently  Other Topics Concern   Not on file  Social History Narrative   Not on file   Social Determinants of Health   Financial Resource Strain: Not on file  Food Insecurity: Not on file  Transportation Needs: Not on file  Physical Activity: Not on file  Stress: Not on file  Social Connections: Not on file  Intimate Partner Violence: Not on file    Review of Systems   Objective:   Vitals:   05/24/21 1118 05/24/21 1122  BP: (!) 144/72 (!) 150/80  Pulse: 74   Resp: 16   Temp: 97.9 F (36.6 C)   TempSrc: Temporal   SpO2: 94%   Weight: 126 lb (57.2 kg)   Height: 5\' 1"  (1.549 m)      Physical Exam Vitals reviewed.  Constitutional:      General: She is not in acute distress.    Appearance: Normal appearance. She is well-developed. She is not ill-appearing.  HENT:     Head: Normocephalic and atraumatic.  Eyes:     Conjunctiva/sclera: Conjunctivae normal.     Pupils: Pupils are equal, round, and reactive to light.  Neck:     Vascular: No carotid bruit.  Cardiovascular:     Rate and Rhythm: Normal rate and regular rhythm.     Heart sounds: Normal heart sounds.  Pulmonary:     Effort: Pulmonary effort is normal.     Breath sounds: Normal breath sounds.  Abdominal:     Palpations: Abdomen is soft. There is no pulsatile mass.     Tenderness: There is no abdominal tenderness.  Musculoskeletal:     Right lower leg: No edema.     Left lower leg: No edema.  Skin:    General: Skin is warm and dry.  Neurological:     General: No focal deficit present.     Mental Status: She is alert and oriented to person, place, and time.  Psychiatric:        Mood and Affect: Mood normal.        Behavior: Behavior normal.       Assessment & Plan:  Junious Dresser  TU FERENCE is a 76 y.o. female . Essential hypertension - Plan: Comprehensive metabolic panel, Lipid panel, Hemoglobin A1c  Screening for hyperlipidemia - Plan: Comprehensive metabolic panel, Lipid panel, Hemoglobin A1c  Screening for diabetes mellitus - Plan: Comprehensive metabolic panel, Lipid panel, Hemoglobin A1c  Hypertension with likely retinal hemorrhages by optometry message.  We will screen for prediabetes/diabetes, hyperlipidemia, now on amlodipine for hypertension with still some elevated readings.  With episodic lightheadedness we will hold on further increases, monitor for improvement this week and coordinate medication changes with her cardiologist.  RTC precautions given.  No orders of the defined types were placed in this encounter.  Patient Instructions  I would recommend discussing further changes on your blood pressure med with cardiology given occasional lightheadedness but also bleeding noted by your optometrist.     Signed,   Meredith Staggers, MD [facility] 05/24/21 2:42 PM

## 2021-06-25 ENCOUNTER — Inpatient Hospital Stay: Payer: Medicare HMO | Attending: Hematology

## 2021-06-25 ENCOUNTER — Other Ambulatory Visit: Payer: Self-pay

## 2021-06-25 DIAGNOSIS — F419 Anxiety disorder, unspecified: Secondary | ICD-10-CM | POA: Insufficient documentation

## 2021-06-25 DIAGNOSIS — Z8505 Personal history of malignant neoplasm of liver: Secondary | ICD-10-CM | POA: Diagnosis not present

## 2021-06-25 DIAGNOSIS — C220 Liver cell carcinoma: Secondary | ICD-10-CM

## 2021-06-25 DIAGNOSIS — Z87891 Personal history of nicotine dependence: Secondary | ICD-10-CM | POA: Insufficient documentation

## 2021-06-25 DIAGNOSIS — J449 Chronic obstructive pulmonary disease, unspecified: Secondary | ICD-10-CM | POA: Diagnosis not present

## 2021-06-25 DIAGNOSIS — M810 Age-related osteoporosis without current pathological fracture: Secondary | ICD-10-CM | POA: Diagnosis not present

## 2021-06-25 DIAGNOSIS — F32A Depression, unspecified: Secondary | ICD-10-CM | POA: Insufficient documentation

## 2021-06-25 LAB — CBC WITH DIFFERENTIAL/PLATELET
Abs Immature Granulocytes: 0.01 10*3/uL (ref 0.00–0.07)
Basophils Absolute: 0.1 10*3/uL (ref 0.0–0.1)
Basophils Relative: 1 %
Eosinophils Absolute: 0.1 10*3/uL (ref 0.0–0.5)
Eosinophils Relative: 2 %
HCT: 45.4 % (ref 36.0–46.0)
Hemoglobin: 14.4 g/dL (ref 12.0–15.0)
Immature Granulocytes: 0 %
Lymphocytes Relative: 25 %
Lymphs Abs: 1.5 10*3/uL (ref 0.7–4.0)
MCH: 29.3 pg (ref 26.0–34.0)
MCHC: 31.7 g/dL (ref 30.0–36.0)
MCV: 92.5 fL (ref 80.0–100.0)
Monocytes Absolute: 0.5 10*3/uL (ref 0.1–1.0)
Monocytes Relative: 8 %
Neutro Abs: 3.9 10*3/uL (ref 1.7–7.7)
Neutrophils Relative %: 64 %
Platelets: 243 10*3/uL (ref 150–400)
RBC: 4.91 MIL/uL (ref 3.87–5.11)
RDW: 13.5 % (ref 11.5–15.5)
WBC: 6.1 10*3/uL (ref 4.0–10.5)
nRBC: 0 % (ref 0.0–0.2)

## 2021-06-25 LAB — COMPREHENSIVE METABOLIC PANEL
ALT: 10 U/L (ref 0–44)
AST: 15 U/L (ref 15–41)
Albumin: 4.3 g/dL (ref 3.5–5.0)
Alkaline Phosphatase: 80 U/L (ref 38–126)
Anion gap: 5 (ref 5–15)
BUN: 16 mg/dL (ref 8–23)
CO2: 33 mmol/L — ABNORMAL HIGH (ref 22–32)
Calcium: 9.2 mg/dL (ref 8.9–10.3)
Chloride: 96 mmol/L — ABNORMAL LOW (ref 98–111)
Creatinine, Ser: 0.6 mg/dL (ref 0.44–1.00)
GFR, Estimated: >60 mL/min (ref >60)
Glucose, Bld: 106 mg/dL — ABNORMAL HIGH (ref 70–99)
Potassium: 4.3 mmol/L (ref 3.5–5.1)
Sodium: 134 mmol/L — ABNORMAL LOW (ref 135–145)
Total Bilirubin: 0.4 mg/dL (ref 0.3–1.2)
Total Protein: 7.4 g/dL (ref 6.5–8.1)

## 2021-06-26 LAB — AFP TUMOR MARKER: AFP, Serum, Tumor Marker: 3.5 ng/mL (ref 0.0–9.2)

## 2021-06-28 ENCOUNTER — Other Ambulatory Visit: Payer: Self-pay

## 2021-06-28 ENCOUNTER — Inpatient Hospital Stay: Payer: Medicare HMO | Admitting: Hematology

## 2021-06-28 ENCOUNTER — Encounter: Payer: Self-pay | Admitting: Hematology

## 2021-06-28 VITALS — BP 154/87 | HR 102 | Temp 98.2°F | Resp 20 | Ht 61.0 in | Wt 127.4 lb

## 2021-06-28 DIAGNOSIS — C220 Liver cell carcinoma: Secondary | ICD-10-CM

## 2021-06-28 DIAGNOSIS — Z8505 Personal history of malignant neoplasm of liver: Secondary | ICD-10-CM | POA: Diagnosis not present

## 2021-06-28 NOTE — Progress Notes (Signed)
?[place]   ?[phone] [phone]   ?Clinic Follow up Note  ? ?Patient Care Team: ?Wendie Agreste, MD as PCP - General (Family Medicine) ?Buford Dresser, MD as PCP - Cardiology (Cardiology) ?Elam Dutch, MD (Inactive) as Consulting Physician (Vascular Surgery) ?Stark Klein, MD as Consulting Physician (General Surgery) ?Truitt Merle, MD as Consulting Physician (Hematology) ?Suella Broad, MD as Consulting Physician (Physical Medicine and Rehabilitation) ?Valinda Party, MD as Consulting Physician (Rheumatology) ?Tanda Rockers, MD as Consulting Physician (Pulmonary Disease) ?Sharia Reeve, MD as Referring Physician (Psychiatry) ? ?Date of Service:  06/28/2021 ? ?CHIEF COMPLAINT: f/u of Tina Bailey ? ?CURRENT THERAPY:  ?Surveillance ? ?ASSESSMENT & PLAN:  ?Tina Bailey is a 76 y.o. female with  ? ?1. Hepatocellular carcinoma, pT3bN0M0, stage IIIB, poorly differentiated  ?-Diagnosed in 11/2014. Treated with embolization and right hepatectomy. Currently on surveillance. ?-most recent MRI abdomen 01/10/21 showed NED. ?-AFP is normal ?-She is over 5 years from initial diagnosis, the recurrence risk has decreased, however she is at risk for new liver cancer  ?-we will obtain abdomen US in the next few weeks and MRI prior to her next visit in 6 months. If next MRI stable, will change to annual [place] and f/u  ?  ?2. Pancreatic cystic lesion ?-Relatively stable over many years ?-Seen by Dr. Ardis Hughs 08/23/19 who discussed repeating EUS with FNA, he felt to be complex given her extreme kyphosis and respiratory issues and it was not recommended ?-He also discussed it would be reasonable to stop surveillance versus repeat MRI in 2 years ?-Stable on MRI 01/10/21 ?  ?3. COPD and smoking cessation  ?-f/u with PCP and Dr. Melvyn Novas ?-Quit smoking completely early 2020 ?-She has started using supplemental oxygen with activity ?  ?4. Chronic compression fracture and multiple fractures  and body pain  ?-She has had multiple fractures in her spine, sternum, and the right hip ?-f/u ortho, Dr. Idamae Schuller ?-Stable appearance on recent MRI ?  ?5. Osteoporosis ?-Stopped FORTEO in 2018 because she didn't want injections ?-Currently on multivitamins and Vitamin d. Continue. ?-severe osteopenia noted on thoracic and lumbar CTs on 12/11/18.  ?  ?6.  Anxiety and depression ?-On BuSpar and Cymbalta, Ativan as needed, and Norco for chronic pain ?-I reviewed medication policy to avoid Ativan and Norco simultaneously ? ? ?PLAN: ?-screening abdomen US to be done soon ?-f/u with NP Tina Bailey in 6 months, with lab and MRI a few days before. ? ? ?No problem-specific Assessment & Plan notes found for this encounter. ? ? ?SUMMARY OF ONCOLOGIC HISTORY: ?Oncology History Overview Note  ?Hepatocellular carcinoma ([facility]) ?  Staging form: Liver (Excluding Intrahepatic Bile Ducts), AJCC 7th Edition ?    Clinical stage from 12/30/2014: Stage Unknown (T1, NX, M0) - Unsigned ? ? ?  ?Hepatocellular carcinoma ([place])  ?12/02/2014 Imaging  ? Liver MRI showed a large lesion in segment 5 and 40, highly suspicious for malignancy. And a 1.2 cm indeterminate pancreatic body lesion. Mildly enlarged portocaval lymph node. Cholelithiasis. ?  ?12/19/2014 Initial Diagnosis  ? Hepatocellular carcinoma ([place]) ?  ?12/19/2014 Initial Biopsy  ? Liver mass biopsy showed parathyroid carcinoma, positive for hep par 1, cytokeratin 8/18, cytokeratin 7 and MOC-31. ?  ?12/25/2014 Imaging  ? CT chest showed a 3 mm groundglass probably nodule in the right upper lobe, mild right pericardial phrenic lymphadenopathy, no definitive evidence of metastasis. ?  ?01/13/2015 Procedure  ? Liquid embolization of the right portal venous system by Interventional Radiologist  Dr. Laurence Ferrari ?  ?03/04/2015 Surgery  ? Liver right lobe hepatectomy, and the portal lymph nodes biopsy ?  ?03/04/2015 Pathology Results  ? Hepatocellular carcinoma, poorly differentiated, spanning 8.5 cm,  surgical margins were negative, lymphovascular invasion is identified, for portal lymph nodes were negative. ? ?  ?02/29/2016 Imaging  ? CT chest w contrast 02/29/2016 ?IMPRESSION: ?1. Status post partial right hepatectomy, without recurrent or ?metastatic disease. ?2. Removal of common duct stent since 06/10/2015. Upper normal to ?mildly dilated common duct after cholecystectomy. Recommend ?attention on follow-up. ?3. Similar pancreatic head lesion, as detailed on prior MRI. ?4. Pelvic floor laxity with cystocele. ?5.  Aortic atherosclerosis. ?  ?10/14/2016 Imaging  ? MRI Abdomen W WO Contrast 10/14/16 ? IMPRESSION: ?1. Stable appearance of the liver status post right hepatectomy. No ?evidence of recurrent tumor or metastatic disease. ?2. Suggested slight interval enlargement of known cystic pancreatic ?mass, likely communicating with the main pancreatic duct and ?probably reflecting an intraductal papillary mucinous neoplasm. This ?demonstrates no aggressive characteristics, and continued ?surveillance suggested. ? ?  ?10/20/2017 Imaging  ? 10/20/2017 MRI Abdomen ?IMPRESSION: ?1. Stable appearance of the liver status post right hepatectomy. No ?evidence of recurrent tumor or metastatic disease. ?2. Unchanged appearance of cystic pancreatic lesion which appears to ?communicate with the main pancreatic duct without main duct ?dilatation. Findings are compatible with intraductal papillary ?mucinous neoplasm. Continued interval follow-up is advised. The next ?follow-up examination should be obtained in 12 months. This ?recommendation follows ACR consensus guidelines: Management of ?Incidental Pancreatic Cysts: A White Paper of the ACR Incidental ?Findings Committee. Joellyn Rued Radiol [phone]. ?  ?12/11/2018 Imaging  ? CT AP w contrast ?Hepatobiliary: Stable appearance of postsurgical changes of partial liver resection. cholecystectomy. There is dilatation of the common bile duct similar to prior CT. ? ?IMPRESSION: ?1.  No acute intra-abdominal or pelvic pathology. ?2. Sigmoid diverticulosis. No bowel obstruction or active inflammation. Normal appendix. ?  ? ? ?  ?06/17/2019 Imaging  ? IMPRESSION: ?1. Mild growth of the suspected intraductal papillary mucinous ?neoplasm of the pancreatic body, now measuring 1.6 by 1.6 by 1.7 cm, ?previously 1.5 by 1.3 by 1.5 cm by my measurements. However, this ?does not qualify as formal "interval growth " based on established ?criteria. Possibilities for follow up at this point include annual ?surveillance pancreatic protocol CT or MRI, or endoscopic ?ultrasound/fine-needle aspiration. ?2. Bilateral sacral insufficiency fractures. ?3. Prior right hepatectomy. No findings of recurrent hepatocellular ?carcinoma. ?4. Sigmoid colon diverticulosis. ?5. Multilevel compression fractures of the lower thoracic and lumbar ?spine with vertebral augmentation at some levels. ?6.  Aortic Atherosclerosis (ICD10-I70.0). ?  ?01/09/2020 Imaging  ? Abdominal ultrasound impression ?Cirrhosis ?Cholecystectomy ?Patent main portal vein with hepatopetal flow ?No ascites ?  ?06/22/2020 Imaging  ? Abdominal ultrasound IMPRESSION: ?1. No hepatic mass. ?2. Limited visualization of the common bile duct. ?  ?01/10/2021 Imaging  ? ABD MRI IMPRESSION: ?1. Stable appearance of right hepatectomy. No findings to suggest recurrent hepatoma. ?2. Unchanged cystic lesion along the pancreatic body with mild internal complexity. As mentioned previously this may represent an intraductal papillary mucinous neoplasm. Continued interval surveillance with pancreas protocol CT or MRI is advised. ?3. Multilevel compression deformities of the thoracic and lumbar spine with vertebral augmentation at some levels. ?4.  Aortic Atherosclerosis (ICD10-I70.0). ?  ?  ? ? ? ?INTERVAL HISTORY:  ?Tina Bailey is here for a follow up of [place]. She was last seen by NP Tina Bailey on 06/25/20 with phone visit in the interim. She  presents to the clinic alone. We  connected with her daughter by phone. ?She reports she is doing well overall. She notes she is living at [facility], which is mainly independent living. ?  ?All other systems were reviewed with the patien

## 2021-06-29 ENCOUNTER — Telehealth: Payer: Self-pay | Admitting: Hematology

## 2021-06-29 NOTE — Telephone Encounter (Signed)
Scheduled follow-up appointments per 4/10 los. Patient's daughter is aware. ?

## 2021-07-02 ENCOUNTER — Encounter (HOSPITAL_COMMUNITY): Payer: Self-pay | Admitting: Radiology

## 2021-07-02 ENCOUNTER — Emergency Department (HOSPITAL_COMMUNITY)
Admission: EM | Admit: 2021-07-02 | Discharge: 2021-07-02 | Disposition: A | Discharge status: Home / Self Care | Payer: Medicare HMO | Attending: Emergency Medicine | Admitting: Emergency Medicine

## 2021-07-02 ENCOUNTER — Emergency Department (HOSPITAL_COMMUNITY): Payer: Medicare HMO

## 2021-07-02 ENCOUNTER — Other Ambulatory Visit: Payer: Self-pay

## 2021-07-02 DIAGNOSIS — J449 Chronic obstructive pulmonary disease, unspecified: Secondary | ICD-10-CM | POA: Diagnosis not present

## 2021-07-02 DIAGNOSIS — Z7951 Long term (current) use of inhaled steroids: Secondary | ICD-10-CM | POA: Insufficient documentation

## 2021-07-02 DIAGNOSIS — I509 Heart failure, unspecified: Secondary | ICD-10-CM | POA: Diagnosis not present

## 2021-07-02 DIAGNOSIS — Z79899 Other long term (current) drug therapy: Secondary | ICD-10-CM

## 2021-07-02 DIAGNOSIS — R4182 Altered mental status, unspecified: Secondary | ICD-10-CM | POA: Insufficient documentation

## 2021-07-02 LAB — CBG MONITORING, ED: Glucose-Capillary: 104 mg/dL — ABNORMAL HIGH (ref 70–99)

## 2021-07-02 LAB — COMPREHENSIVE METABOLIC PANEL
ALT: 13 U/L (ref 0–44)
AST: 21 U/L (ref 15–41)
Albumin: 3.2 g/dL — ABNORMAL LOW (ref 3.5–5.0)
Alkaline Phosphatase: 61 U/L (ref 38–126)
Anion gap: 3 — ABNORMAL LOW (ref 5–15)
BUN: 14 mg/dL (ref 8–23)
CO2: 27 mmol/L (ref 22–32)
Calcium: 7.3 mg/dL — ABNORMAL LOW (ref 8.9–10.3)
Chloride: 104 mmol/L (ref 98–111)
Creatinine, Ser: 0.31 mg/dL — ABNORMAL LOW (ref 0.44–1.00)
GFR, Estimated: >60 mL/min (ref >60)
Glucose, Bld: 95 mg/dL (ref 70–99)
Potassium: 4.3 mmol/L (ref 3.5–5.1)
Sodium: 134 mmol/L — ABNORMAL LOW (ref 135–145)
Total Bilirubin: 0.8 mg/dL (ref 0.3–1.2)
Total Protein: 5.8 g/dL — ABNORMAL LOW (ref 6.5–8.1)

## 2021-07-02 LAB — URINALYSIS, ROUTINE W REFLEX MICROSCOPIC
Bilirubin Urine: NEGATIVE
Glucose, UA: NEGATIVE mg/dL
Hgb urine dipstick: NEGATIVE
Ketones, ur: NEGATIVE mg/dL
Leukocytes,Ua: NEGATIVE
Nitrite: NEGATIVE
Protein, ur: NEGATIVE mg/dL
Specific Gravity, Urine: 1.016 (ref 1.005–1.030)
pH: 7 (ref 5.0–8.0)

## 2021-07-02 LAB — CBC
HCT: 44.1 % (ref 36.0–46.0)
Hemoglobin: 14.3 g/dL (ref 12.0–15.0)
MCH: 30.4 pg (ref 26.0–34.0)
MCHC: 32.4 g/dL (ref 30.0–36.0)
MCV: 93.8 fL (ref 80.0–100.0)
Platelets: 236 10*3/uL (ref 150–400)
RBC: 4.7 MIL/uL (ref 3.87–5.11)
RDW: 13.6 % (ref 11.5–15.5)
WBC: 7.4 10*3/uL (ref 4.0–10.5)
nRBC: 0 % (ref 0.0–0.2)

## 2021-07-02 MED ORDER — MORPHINE SULFATE (PF) 4 MG/ML IV SOLN
4.0000 mg | Freq: Once | INTRAVENOUS | Status: AC
  Administered 2021-07-02: 4 mg via INTRAVENOUS
  Filled 2021-07-02: qty 1

## 2021-07-02 MED ORDER — OYSTER SHELL CALCIUM/D3 500-5 MG-MCG PO TABS
1.0000 | ORAL_TABLET | Freq: Once | ORAL | Status: AC
  Administered 2021-07-02: 1 via ORAL
  Filled 2021-07-02: qty 1

## 2021-07-02 MED ORDER — CALCIUM ACETATE (PHOS BINDER) 667 MG PO CAPS: 667.0000 mg | ORAL_CAPSULE | Freq: Once | ORAL | Status: DC

## 2021-07-02 NOTE — ED Triage Notes (Signed)
Pt from [place]. Daughter come to visit patient today and fouind her to be unresponsive. Shw was arousable by EMS but very lethargic. Pt took ativan and possibly her hydrocodone pt was unsure if both. Pt is very slow to answer but is oriented to self, dob, and some events. ?

## 2021-07-02 NOTE — ED Provider Notes (Signed)
?[facility] DEPT ?Provider Note ? ? ?CSN: [id number] ?Arrival date & time: 07/02/21  1343 ? ?  ? ?History ? ?Chief Complaint  ?Patient presents with  ? Altered Mental Status  ? ? ?Tina Bailey is a 76 y.o. female. ? ?HPI ? ?76 year old female past medical history of COPD, chronic pain on chronic opiate medication, fibromyalgia, oxygen dependent, CHF presents emergency department after an episode of altered mental status.  The daughter came to visit the patient and found her to be altered and very sleepy.  EMS arrived and found the patient to be difficult to arouse but protecting her airway.  Patient takes hydrocodone chronically and it appears the patient took an additional dose of Ativan.  Patient on arrival is slow to respond but awake, protecting her airway.  No other report of acute illness or medication changes. ? ?Home Medications ?Prior to Admission medications   ?Medication Sig Start Date End Date Taking? Authorizing Provider  ?acetaminophen (TYLENOL) 325 MG tablet Take 2 tablets (650 mg total) by mouth every 6 (six) hours as needed for mild pain (or Fever >/= 101). 03/06/18  Yes Florencia Reasons, MD  ?albuterol (PROVENTIL) (2.5 MG/3ML) 0.083% nebulizer solution Take 3 mLs (2.5 mg total) by nebulization every 4 (four) hours as needed for wheezing or shortness of breath. 02/25/21  Yes Tanda Rockers, MD  ?amLODipine (NORVASC) 2.5 MG tablet Take 1 tablet (2.5 mg total) by mouth daily. ?Patient taking differently: Take 2.5 mg by mouth every morning. 05/20/21  Yes Buford Dresser, MD  ?aspirin-acetaminophen-caffeine [facility] MIGRAINE) [phone] MG tablet Take 1-2 tablets by mouth every 6 (six) hours as needed for headache.   Yes [provider]  ?Budeson-Glycopyrrol-Formoterol (BREZTRI AEROSPHERE) 160-9-4.8 MCG/ACT AERO Inhale 2 puffs into the lungs in the morning and at bedtime. ?Patient taking differently: Inhale 2 puffs into the lungs 2 (two) times daily. 11/02/20  Yes  Tanda Rockers, MD  ?busPIRone (BUSPAR) 10 MG tablet TAKE 2 TABLETS BY MOUTH 3 TIMES DAILY. ?Patient taking differently: Take 20 mg by mouth 3 (three) times daily. 04/22/21  Yes Wendie Agreste, MD  ?Cholecalciferol (VITAMIN D3) 50 MCG (2000 UT) capsule Take 2,000 Units by mouth daily after lunch. 04/05/21  Yes [provider]  ?DULoxetine (CYMBALTA) 30 MG capsule Take 30 mg by mouth every morning.   Yes [provider]  ?HYDROcodone-acetaminophen (NORCO) 10-325 MG tablet Take 1 tablet by mouth in the morning, at noon, in the evening, and at bedtime. 7am, 1pm, 7pm , 10pm   Yes [provider]  ?LORazepam (ATIVAN) 0.5 MG tablet Take 0.5 mg by mouth at bedtime as needed for anxiety or sleep. 05/20/19  Yes [provider]  ?melatonin 5 MG TABS 5 mg at bedtime as needed (sleep).   Yes [provider]  ?omeprazole (PRILOSEC) 40 MG capsule Take 40 mg by mouth every morning. 04/21/21  Yes [provider]  ?hydrOXYzine (ATARAX) 10 MG tablet Take 0.5-1 tablets (5-10 mg total) by mouth 3 (three) times daily as needed for anxiety. ?Patient not taking: Reported on 07/02/2021 04/22/21   Wendie Agreste, MD  ?   ? ?Allergies    ?Zoloft [sertraline hcl], Amoxicillin-pot clavulanate, Clindamycin/lincomycin, Paxil [paroxetine hcl], and Sulfa antibiotics   ? ?Review of Systems   ?Review of Systems  ?Unable to perform ROS: Mental status change  ? ?Physical Exam ?Updated Vital Signs ?BP (!) 148/74   Pulse (!) 101   Temp 98.7 ?F (37.1 ?C)  Resp 16   Ht '5\' 1"'$  (1.549 m)   Wt 57.6 kg   SpO2 94%   BMI 24.00 kg/m?  ?Physical Exam ?Vitals and nursing note reviewed.  ?Constitutional:   ?   General: She is not in acute distress. ?   Appearance: Normal appearance. She is not diaphoretic.  ?HENT:  ?   Head: Normocephalic.  ?   Mouth/Throat:  ?   Mouth: Mucous membranes are moist.  ?Eyes:  ?   Pupils: Pupils are equal, round, and reactive to light.  ?Cardiovascular:  ?   Rate and Rhythm:  Normal rate.  ?Pulmonary:  ?   Effort: Pulmonary effort is normal. No respiratory distress.  ?Abdominal:  ?   Palpations: Abdomen is soft.  ?   Tenderness: There is no abdominal tenderness.  ?Musculoskeletal:  ?   Cervical back: No rigidity.  ?Skin: ?   General: Skin is warm.  ?Neurological:  ?   Mental Status: She is alert. Mental status is at baseline.  ? ? ?ED Results / Procedures / Treatments   ?Labs ?(all labs ordered are listed, but only abnormal results are displayed) ?Labs Reviewed  ?COMPREHENSIVE METABOLIC PANEL - Abnormal; Notable for the following components:  ?    Result Value  ? Sodium 134 (*)   ? Creatinine, Ser 0.31 (*)   ? Calcium 7.3 (*)   ? Total Protein 5.8 (*)   ? Albumin 3.2 (*)   ? Anion gap 3 (*)   ? All other components within normal limits  ?URINALYSIS, ROUTINE W REFLEX MICROSCOPIC - Abnormal; Notable for the following components:  ? APPearance HAZY (*)   ? All other components within normal limits  ?CBG MONITORING, ED - Abnormal; Notable for the following components:  ? Glucose-Capillary 104 (*)   ? All other components within normal limits  ?CBC  ? ? ?EKG ?None ? ?Radiology ?CT Head Wo Contrast ? ?Result Date: 07/02/2021 ?CLINICAL DATA:  Mental status change, lethargy EXAM: CT HEAD WITHOUT CONTRAST TECHNIQUE: Contiguous axial images were obtained from the base of the skull through the vertex without intravenous contrast. RADIATION DOSE REDUCTION: This exam was performed according to the departmental dose-optimization program which includes automated exposure control, adjustment of the mA and/or kV according to patient size and/or use of iterative reconstruction technique. COMPARISON:  03/09/2018 FINDINGS: Brain: Diffuse parenchymal atrophy. Patchy areas of hypoattenuation in deep and periventricular white matter bilaterally. Negative for acute intracranial hemorrhage, mass lesion, acute infarction, midline shift, or mass-effect. Acute infarct may be inapparent on noncontrast CT. Ventricles  and sulci symmetric. Vascular: Atherosclerotic and physiologic intracranial calcifications. Skull: Normal. Negative for fracture or focal lesion. Sinuses/Orbits: No acute finding. Other: None IMPRESSION: 1. No acute findings. Electronically Signed   By: Lucrezia Europe M.D.   On: 07/02/2021 16:12   ? ?Procedures ?Procedures  ? ? ?Medications Ordered in ED ?Medications  ?morphine (PF) 4 MG/ML injection 4 mg (has no administration in time range)  ?calcium acetate (PHOSLO) capsule 667 mg (has no administration in time range)  ? ? ?ED Course/ Medical Decision Making/ A&P ?  ?                        ?Medical Decision Making ?Amount and/or Complexity of Data Reviewed ?Labs: ordered. ?Radiology: ordered. ? ?Risk ?Prescription drug management. ? ? ?76 year old female presents emergency department for being difficult to arouse at home.  She takes chronic opiates, reported that she accidentally took a dose of  Ativan as well causing her to be lethargic.  On arrival she is sleepy but arousable and oriented.  At times not cooperative.  Work-up here is unremarkable.  Blood work, urine and head CT appear normal.  Patient appears to have metabolized and is back to baseline.  Is now complaining of acute pain and she has missed her opiate dose in the regimen. ? ?Gave the patient home dose of opiate.  She is baseline, daughter at bedside wants to take her home.  No other sign of acute/emergent condition.  Patient at this time appears safe and stable for discharge and close outpatient follow up. Discharge plan and strict return to ED precautions discussed, patient verbalizes understanding and agreement. ? ? ? ? ? ? ? ?Final Clinical Impression(s) / ED Diagnoses ?Final diagnoses:  ?Polypharmacy  ? ? ?Rx / DC Orders ?ED Discharge Orders   ? ? None  ? ?  ? ? ?  ?Lorelle Gibbs, DO ?07/02/21 1654 ? ?

## 2021-07-02 NOTE — Discharge Instructions (Addendum)
You have been seen and discharged from the emergency department.  Your blood work, urine and head CT were baseline for you.  You were given an IV dose of pain medicine here in the department to resume your regimen.  Do not take this pain medicine with other sedating medications like Ativan.  This can cause oversedation.  Follow-up with your primary provider for further evaluation and further care. Take home medications as prescribed. If you have any worsening symptoms or further concerns for your health please return to an emergency department for further evaluation. ?

## 2021-07-02 NOTE — ED Notes (Signed)
PT uncooperative at this time. Wont stop moving her arm back and forth. BP wont take. Will try again in a few minutes. ?

## 2021-07-04 ENCOUNTER — Emergency Department (HOSPITAL_COMMUNITY)
Admission: EM | Admit: 2021-07-04 | Discharge: 2021-07-05 | Disposition: A | Discharge status: Other Acute Inpatient Hospital | Payer: Medicare HMO | Attending: Emergency Medicine | Admitting: Emergency Medicine

## 2021-07-04 ENCOUNTER — Encounter (HOSPITAL_COMMUNITY): Payer: Self-pay

## 2021-07-04 DIAGNOSIS — R45851 Suicidal ideations: Secondary | ICD-10-CM | POA: Insufficient documentation

## 2021-07-04 DIAGNOSIS — F322 Major depressive disorder, single episode, severe without psychotic features: Secondary | ICD-10-CM | POA: Insufficient documentation

## 2021-07-04 DIAGNOSIS — G8929 Other chronic pain: Secondary | ICD-10-CM | POA: Diagnosis not present

## 2021-07-04 DIAGNOSIS — M797 Fibromyalgia: Secondary | ICD-10-CM | POA: Diagnosis not present

## 2021-07-04 DIAGNOSIS — Z20822 Contact with and (suspected) exposure to covid-19: Secondary | ICD-10-CM | POA: Insufficient documentation

## 2021-07-04 DIAGNOSIS — Z79899 Other long term (current) drug therapy: Secondary | ICD-10-CM | POA: Insufficient documentation

## 2021-07-04 DIAGNOSIS — T1491XA Suicide attempt, initial encounter: Secondary | ICD-10-CM

## 2021-07-04 DIAGNOSIS — F32A Depression, unspecified: Secondary | ICD-10-CM | POA: Diagnosis present

## 2021-07-04 LAB — CBC WITH DIFFERENTIAL/PLATELET
Abs Immature Granulocytes: 0.02 10*3/uL (ref 0.00–0.07)
Basophils Absolute: 0 10*3/uL (ref 0.0–0.1)
Basophils Relative: 1 %
Eosinophils Absolute: 0.1 10*3/uL (ref 0.0–0.5)
Eosinophils Relative: 2 %
HCT: 44.6 % (ref 36.0–46.0)
Hemoglobin: 14.5 g/dL (ref 12.0–15.0)
Immature Granulocytes: 0 %
Lymphocytes Relative: 19 %
Lymphs Abs: 1.5 10*3/uL (ref 0.7–4.0)
MCH: 30 pg (ref 26.0–34.0)
MCHC: 32.5 g/dL (ref 30.0–36.0)
MCV: 92.1 fL (ref 80.0–100.0)
Monocytes Absolute: 0.7 10*3/uL (ref 0.1–1.0)
Monocytes Relative: 9 %
Neutro Abs: 5.4 10*3/uL (ref 1.7–7.7)
Neutrophils Relative %: 69 %
Platelets: 233 10*3/uL (ref 150–400)
RBC: 4.84 MIL/uL (ref 3.87–5.11)
RDW: 13.5 % (ref 11.5–15.5)
WBC: 7.8 10*3/uL (ref 4.0–10.5)
nRBC: 0 % (ref 0.0–0.2)

## 2021-07-04 LAB — ETHANOL: Alcohol, Ethyl (B): <10 mg/dL (ref <10)

## 2021-07-04 LAB — RAPID URINE DRUG SCREEN, HOSP PERFORMED
Amphetamines: NOT DETECTED
Barbiturates: NOT DETECTED
Benzodiazepines: NOT DETECTED
Cocaine: NOT DETECTED
Opiates: POSITIVE — AB
Tetrahydrocannabinol: NOT DETECTED

## 2021-07-04 LAB — COMPREHENSIVE METABOLIC PANEL
ALT: 19 U/L (ref 0–44)
AST: 23 U/L (ref 15–41)
Albumin: 3.9 g/dL (ref 3.5–5.0)
Alkaline Phosphatase: 82 U/L (ref 38–126)
Anion gap: 8 (ref 5–15)
BUN: 16 mg/dL (ref 8–23)
CO2: 28 mmol/L (ref 22–32)
Calcium: 8.9 mg/dL (ref 8.9–10.3)
Chloride: 98 mmol/L (ref 98–111)
Creatinine, Ser: 0.51 mg/dL (ref 0.44–1.00)
GFR, Estimated: >60 mL/min (ref >60)
Glucose, Bld: 150 mg/dL — ABNORMAL HIGH (ref 70–99)
Potassium: 3.5 mmol/L (ref 3.5–5.1)
Sodium: 134 mmol/L — ABNORMAL LOW (ref 135–145)
Total Bilirubin: 0.6 mg/dL (ref 0.3–1.2)
Total Protein: 7 g/dL (ref 6.5–8.1)

## 2021-07-04 LAB — ACETAMINOPHEN LEVEL: Acetaminophen (Tylenol), Serum: <10 ug/mL — ABNORMAL LOW (ref 10–30)

## 2021-07-04 LAB — RESP PANEL BY RT-PCR (FLU A&B, COVID) ARPGX2
Influenza A by PCR: NEGATIVE
Influenza B by PCR: NEGATIVE
SARS Coronavirus 2 by RT PCR: NEGATIVE

## 2021-07-04 LAB — SALICYLATE LEVEL: Salicylate Lvl: <7 mg/dL — ABNORMAL LOW (ref 7.0–30.0)

## 2021-07-04 MED ORDER — HYDROCODONE-ACETAMINOPHEN 10-325 MG PO TABS
1.0000 | ORAL_TABLET | Freq: Four times a day (QID) | ORAL | Status: DC
  Administered 2021-07-04 – 2021-07-05 (×2): 1 via ORAL
  Filled 2021-07-04 (×2): qty 1

## 2021-07-04 MED ORDER — LORAZEPAM 0.5 MG PO TABS: 0.5000 mg | ORAL_TABLET | Freq: Every evening | ORAL | Status: DC | PRN

## 2021-07-04 MED ORDER — NICOTINE 21 MG/24HR TD PT24
21.0000 mg | MEDICATED_PATCH | Freq: Every day | TRANSDERMAL | Status: DC
  Filled 2021-07-04 (×2): qty 1

## 2021-07-04 NOTE — ED Notes (Signed)
Attempted to call [place] for transport outside county, unable to transport patient until after 0700. ?

## 2021-07-04 NOTE — ED Provider Notes (Signed)
?[place] DEPT ?Provider Note ? ? ?CSN: [id number] ?Arrival date & time: 07/04/21  1350 ? ?  ? ?History ? ?Chief Complaint  ?Patient presents with  ? Suicidal  ? ? ?Tina Bailey is a 76 y.o. female who presents emergency department chief complaint of suicidal intent and depression.  She has a past medical history of chronic pain.  She was seen in the emergency department 2 days ago and presumed to have unintentional overdose however daughter states that when she "came out of her lorazepam Haze" she admitted to taking 15 tablets of lorazepam the night before bed because she "did not want to wake up the next day."  Patient is tearful and states that she is in chronic pain.  She does not feel that there is any hope that she will ever have anything better than what she has.  She feels that there is no value to her life and she intends to try to end her life.  She has no previous history of major depressive disorder or previous hospitalization and has never attempted suicide.  She is on BuSpar states that sometime back she had her medication dose increased but states that did not help and she continued to cry daily.  I placed the patient under involuntary commitment.  She denies homicidal ideation or hallucinations and has no other drug or alcohol use ?HPI ? ?  ? ?Home Medications ?Prior to Admission medications   ?Medication Sig Start Date End Date Taking? Authorizing Provider  ?acetaminophen (TYLENOL) 325 MG tablet Take 2 tablets (650 mg total) by mouth every 6 (six) hours as needed for mild pain (or Fever >/= 101). 03/06/18   Florencia Reasons, MD  ?albuterol (PROVENTIL) (2.5 MG/3ML) 0.083% nebulizer solution Take 3 mLs (2.5 mg total) by nebulization every 4 (four) hours as needed for wheezing or shortness of breath. 02/25/21   Tanda Rockers, MD  ?amLODipine (NORVASC) 2.5 MG tablet Take 1 tablet (2.5 mg total) by mouth daily. ?Patient taking differently: Take 2.5 mg by mouth every morning.  05/20/21   Buford Dresser, MD  ?aspirin-acetaminophen-caffeine [facility] MIGRAINE) [phone] MG tablet Take 1-2 tablets by mouth every 6 (six) hours as needed for headache.    [provider]  ?Budeson-Glycopyrrol-Formoterol (BREZTRI AEROSPHERE) 160-9-4.8 MCG/ACT AERO Inhale 2 puffs into the lungs in the morning and at bedtime. ?Patient taking differently: Inhale 2 puffs into the lungs 2 (two) times daily. 11/02/20   Tanda Rockers, MD  ?busPIRone (BUSPAR) 10 MG tablet TAKE 2 TABLETS BY MOUTH 3 TIMES DAILY. ?Patient taking differently: Take 20 mg by mouth 3 (three) times daily. 04/22/21   Wendie Agreste, MD  ?Cholecalciferol (VITAMIN D3) 50 MCG (2000 UT) capsule Take 2,000 Units by mouth daily after lunch. 04/05/21   [provider]  ?DULoxetine (CYMBALTA) 30 MG capsule Take 30 mg by mouth every morning.    [provider]  ?HYDROcodone-acetaminophen (NORCO) 10-325 MG tablet Take 1 tablet by mouth in the morning, at noon, in the evening, and at bedtime. 7am, 1pm, 7pm , 10pm    [provider]  ?hydrOXYzine (ATARAX) 10 MG tablet Take 0.5-1 tablets (5-10 mg total) by mouth 3 (three) times daily as needed for anxiety. ?Patient not taking: Reported on 07/02/2021 04/22/21   Wendie Agreste, MD  ?LORazepam (ATIVAN) 0.5 MG tablet Take 0.5 mg by mouth at bedtime as needed for anxiety or sleep. 05/20/19   [provider]  ?melatonin 5 MG TABS 5 mg  at bedtime as needed (sleep).    [provider]  ?omeprazole (PRILOSEC) 40 MG capsule Take 40 mg by mouth every morning. 04/21/21   [provider]  ?   ? ?Allergies    ?Zoloft [sertraline hcl], Amoxicillin-pot clavulanate, Clindamycin/lincomycin, Paxil [paroxetine hcl], and Sulfa antibiotics   ? ?Review of Systems   ?Review of Systems ? ?Physical Exam ?Updated Vital Signs ?BP 134/68 (BP Location: Left Arm)   Pulse 91   Temp 98 ?F (36.7 ?C) (Oral)   Resp 18   SpO2 92%  ?Physical Exam ?Vitals and nursing note  reviewed.  ?Constitutional:   ?   General: She is not in acute distress. ?   Appearance: She is well-developed. She is not diaphoretic.  ?HENT:  ?   Head: Normocephalic and atraumatic.  ?   Right Ear: External ear normal.  ?   Left Ear: External ear normal.  ?   Nose: Nose normal.  ?   Mouth/Throat:  ?   Mouth: Mucous membranes are moist.  ?Eyes:  ?   General: No scleral icterus. ?   Conjunctiva/sclera: Conjunctivae normal.  ?Cardiovascular:  ?   Rate and Rhythm: Normal rate and regular rhythm.  ?   Heart sounds: Normal heart sounds. No murmur heard. ?  No friction rub. No gallop.  ?Pulmonary:  ?   Effort: Pulmonary effort is normal. No respiratory distress.  ?   Breath sounds: Normal breath sounds.  ?Abdominal:  ?   General: Bowel sounds are normal. There is no distension.  ?   Palpations: Abdomen is soft. There is no mass.  ?   Tenderness: There is no abdominal tenderness. There is no guarding.  ?Musculoskeletal:  ?   Cervical back: Normal range of motion.  ?Skin: ?   General: Skin is warm and dry.  ?Neurological:  ?   Mental Status: She is alert and oriented to person, place, and time.  ?Psychiatric:     ?   Behavior: Behavior normal.  ? ? ?ED Results / Procedures / Treatments   ?Labs ?(all labs ordered are listed, but only abnormal results are displayed) ?Labs Reviewed  ?RESP PANEL BY RT-PCR (FLU A&B, COVID) [id number]  ?COMPREHENSIVE METABOLIC PANEL  ?ETHANOL  ?RAPID URINE DRUG SCREEN, HOSP PERFORMED  ?CBC WITH DIFFERENTIAL/PLATELET  ?SALICYLATE LEVEL  ?ACETAMINOPHEN LEVEL  ? ? ?EKG ?None ? ?Radiology ?CT Head Wo Contrast ? ?Result Date: 07/02/2021 ?CLINICAL DATA:  Mental status change, lethargy EXAM: CT HEAD WITHOUT CONTRAST TECHNIQUE: Contiguous axial images were obtained from the base of the skull through the vertex without intravenous contrast. RADIATION DOSE REDUCTION: This exam was performed according to the departmental dose-optimization program which includes automated exposure control, adjustment of the mA  and/or kV according to patient size and/or use of iterative reconstruction technique. COMPARISON:  03/09/2018 FINDINGS: Brain: Diffuse parenchymal atrophy. Patchy areas of hypoattenuation in deep and periventricular white matter bilaterally. Negative for acute intracranial hemorrhage, mass lesion, acute infarction, midline shift, or mass-effect. Acute infarct may be inapparent on noncontrast CT. Ventricles and sulci symmetric. Vascular: Atherosclerotic and physiologic intracranial calcifications. Skull: Normal. Negative for fracture or focal lesion. Sinuses/Orbits: No acute finding. Other: None IMPRESSION: 1. No acute findings. Electronically Signed   By: Lucrezia Europe M.D.   On: 07/02/2021 16:12   ? ?Procedures ?Procedures  ? ? ?Medications Ordered in ED ?Medications  ?nicotine (NICODERM CQ - dosed in mg/24 hours) patch 21 mg (has no administration in time range)  ? ? ?ED Course/ Medical  Decision Making/ A&P ?  ?                        ?Medical Decision Making ?Amount and/or Complexity of Data Reviewed ?Labs: ordered. ? ?Risk ?OTC drugs. ?Prescription drug management. ? ? ?Patient medically clear. ? ? ?Final Clinical Impression(s) / ED Diagnoses ?Final diagnoses:  ?None  ? ? ?Rx / DC Orders ?ED Discharge Orders   ? ? None  ? ?  ? ? ?  ?Margarita Mail, PA-C ?07/04/21 1655 ? ?  ?Regan Lemming, MD ?07/04/21 2255 ? ?

## 2021-07-04 NOTE — ED Notes (Signed)
Per Marijean Bravo: ? ?TTS is completed and Pt has been accepted to [facility] gero-psych unit. Per [name], RN at [facility]: "Pt is appropriate for Geropsych. Admit to room 26. Dr Weber Cooks admitting. Change of shift now so can admit later this evening around 10pm after orders entered, IVC paperwork faxed and RN report called. RN will need to call report to [phone]. Fax IVC papers to [phone]." ?

## 2021-07-04 NOTE — BH Assessment (Signed)
@   1720- Attempted to contact RNs who had charted on pt this shift to initiate TTS tele-assessment. One left the conversation and no response from the other RN after 40 mins. Marland Kitchen  ?

## 2021-07-04 NOTE — ED Notes (Signed)
Patient changed into scrubs and wanded by security. Patient belongings taken with family member at bedside. Family member remains at bedside. ?

## 2021-07-04 NOTE — ED Triage Notes (Signed)
Pt presents with family with c/o suicidal ideation. Pt is here voluntary at this time, reporting that she does not want to live any longer. Pt had what the family thought was an accidental overdose recently and she was seen for same. Pt admitted to family that the overdose was intentional and that she cries every single day and does not see how it is ever going to get any better. Pt has chronic pain. ?

## 2021-07-04 NOTE — BH Assessment (Signed)
Comprehensive Clinical Assessment (CCA) Note ? ?07/04/2021 ?Tina Bailey ?[id number] ? ?DISPOSITION: Tina Alar, NP recommends inpatient geriatric-psychiatry. Per Tina Must, RN at [facility]: "Pt is appropriate for Geropsych. Admit to room 26. [facility] admitting. Change of shift now so can admit later this evening around 10pm after orders entered, IVC paperwork faxed and RN report called. RN will need to call report to [phone]. Fax IVC papers to [phone]." Notified Tina Bailey and Tina Medin, RN of acceptance. ? ? ?The patient demonstrates the following risk factors for suicide: Chronic risk factors for suicide include: medical illness COPD, chronic pain on chronic opiate medication, fibromyalgia, oxygen dependent, CHF  and chronic pain. Acute risk factors for suicide include: loss (financial, interpersonal, professional). Protective factors for this patient include: responsibility to others (children, family). Considering these factors, the overall suicide risk at this point appears to be high. Patient is not appropriate for outpatient follow up. ? ?Tina Bailey from 07/04/2021 in [place] [facility] from 03/09/2018 in [facility]  ?C-SSRS RISK CATEGORY High Risk Moderate Risk  ? ?  ? ?Pt is a 76 year old divorced female who presents unaccompanied to [facility] reporting symptoms of depression and suicidal ideation. She was seen in the emergency department 2 days ago and presumed to have unintentional overdose however today Pt states she told her daughter this was a suicide attempt. Pt reports she ingested 15 tabs of lorazepam. She continues to express suicidal ideation and says she is angry at herself for not taking enough medication to kill her. She states she is embarrassed to be in her current situation. She says she has experienced chronic pain for years due to osteoporosis. She explains that every days she wakes up in pain. She  says she has been very diligent in taking her medications and forcing herself to be active but "now I finally realized there is no point. It's only going to get worse." She denies history of depression but says she has felt depressed and hopeless recently. Pt acknowledges symptoms including crying spells, social withdrawal, loss of interest in usual pleasures, fatigue, irritability, decreased concentration, and feelings of guilt, worthlessness and hopelessness. She denies history of suicide attempts. Pt denies any history of intentional self-injurious behaviors. Pt denies current homicidal ideation or history of violence. Pt denies any history of auditory or visual hallucinations. Pt denies history of alcohol or other substance use. ? ?Pt identifies chronic pain as her primary stressor. She says she is no longer able to do the things she loves, such as play guitar. She states she lives in a senior living community and can perform ADLs independently but "it's getting harder." She says she has three children, two who live out of state and a daughter, Tina Bailey, who lives locally and is supportive. Pt denies history of inpatient or outpatient mental health treatment. She denies history of abuse. She denies legal problems. She denies access to firearms. ? ?Pt is dressed in hospital scrubs, alert and oriented x4. Pt speaks in a clear tone, at moderate volume and normal pace. Motor behavior appears normal. Eye contact is good and Pt is at times tearful. Pt's mood is depressed and anxious: affect is congruent with mood. Thought process is coherent and relevant. There is no indication Pt is currently responding to internal stimuli or experiencing delusional thought content. She is cooperative but says she does not want to be psychiatrically hospitalized. Pt has been placed under involuntary commitment. ? ?Chief Complaint:  ?  Chief Complaint  ?Patient presents with  ? Suicidal  ? ?Visit Diagnosis: F32.2 Major depressive disorder,  Single episode, Severe ? ? ?CCA Screening, Triage and Referral (STR) ? ?Patient Reported Information ?How did you hear about [place]? Family/Friend ? ?Referral name: No data recorded ?Referral phone number: No data recorded ? ?Whom do you see for routine medical problems? No data recorded ?Practice/Facility Name: No data recorded ?Practice/Facility Phone Number: No data recorded ?Name of Contact: No data recorded ?Contact Number: No data recorded ?Contact Fax Number: No data recorded ?Prescriber Name: No data recorded ?Prescriber Address (if known): No data recorded ? ?What Is the Reason for Your Visit/Call Today? Pt reports yesterday she overdosed on 15 tabs of lorazepam. She did not tell her daughter until today this was a suicide attempt. Pt says she no longer wants to live with chronic pain and feels her life has no purpose. She says she is angry with herself that she did not take enough medication to kill herself. ? ?How Long Has This Been Causing You Problems? > than 6 months ? ?What Do You Feel Would Help You the Most Today? Treatment for Depression or other mood problem ? ? ?Have You Recently Been in Any Inpatient Treatment (Hospital/Detox/Crisis [facility]/28-Day Program)? No data recorded ?Name/Location of Program/Hospital:No data recorded ?How Long Were You There? No data recorded ?When Were You Discharged? No data recorded ? ?Have You Ever Received Services From [employer] Before? No data recorded ?Who Do You See at [facility]? No data recorded ? ?Have You Recently Had Any Thoughts About Hurting Yourself? Yes ? ?Are You Planning to Commit Suicide/Harm Yourself At This time? Yes ? ? ?Have you Recently Had Thoughts About [place]? No ? ?Explanation: No data recorded ? ?Have You Used Any Alcohol or Drugs in the Past 24 Hours? No ? ?How Long Ago Did You Use Drugs or Alcohol? No data recorded ?What Did You Use and How Much? No data recorded ? ?Do You Currently Have a Therapist/Psychiatrist? No ? ?Name of  Therapist/Psychiatrist: No data recorded ? ?Have You Been Recently Discharged From Any Office Practice or Programs? No ? ?Explanation of Discharge From Practice/Program: No data recorded ? ?  ?CCA Screening Triage Referral Assessment ?Type of Contact: No data recorded ?Is this Initial or Reassessment? No data recorded ?Date Telepsych consult ordered in [facility]:  No data recorded ?Time Telepsych consult ordered in [facility]:  No data recorded ? ?Patient Reported Information Reviewed? No data recorded ?Patient Left Without Being Seen? No data recorded ?Reason for Not Completing Assessment: No data recorded ? ?Collateral Involvement: Medical record ? ? ?Does Patient Have a Stage manager Guardian? No data recorded ?Name and Contact of Legal Guardian: No data recorded ?If Minor and Not Living with Parent(s), Who has Custody? NA ? ?Is CPS involved or ever been involved? Never ? ?Is APS involved or ever been involved? Never ? ? ?Patient Determined To Be At Risk for Harm To Self or Others Based on Review of Patient Reported Information or Presenting Complaint? Yes, for Self-Harm ? ?Method: No data recorded ?Availability of Means: No data recorded ?Intent: No data recorded ?Notification Required: No data recorded ?Additional Information for Danger to Others Potential: No data recorded ?Additional Comments for Danger to Others Potential: No data recorded ?Are There Guns or Other Weapons in [place]? No data recorded ?Types of Guns/Weapons: No data recorded ?Are These Weapons Safely Secured?  No data recorded ?Who Could Verify You Are Able To Have These Secured: No data recorded ?Do You Have any Outstanding Charges, Pending Court Dates, Parole/Probation? No data recorded ?Contacted To Inform of Risk of Harm To Self or Others: Family/Significant Other: ? ? ?Location of Assessment: [facility] ? ? ?Does Patient Present under Involuntary Commitment? No ? ?IVC Papers Initial File Date: No data recorded ? ?[place]  of Residence: [place] ? ? ?Patient Currently Receiving the Following Services: Medication Management ? ? ?Determination of Need: Emergent (2 hours) ? ? ?Options For Referral: Geropsychiatric Harriette Ohara

## 2021-07-05 ENCOUNTER — Inpatient Hospital Stay
Admission: AD | Admit: 2021-07-05 | Discharge: 2021-07-16 | DRG: 885 | Disposition: A | Discharge status: Home / Self Care | Payer: Medicare HMO | Source: Intra-hospital | Attending: Psychiatry | Admitting: Psychiatry

## 2021-07-05 ENCOUNTER — Encounter: Payer: Self-pay | Admitting: Psychiatry

## 2021-07-05 ENCOUNTER — Other Ambulatory Visit: Payer: Self-pay

## 2021-07-05 DIAGNOSIS — F419 Anxiety disorder, unspecified: Secondary | ICD-10-CM | POA: Diagnosis present

## 2021-07-05 DIAGNOSIS — Z7951 Long term (current) use of inhaled steroids: Secondary | ICD-10-CM | POA: Diagnosis not present

## 2021-07-05 DIAGNOSIS — Z803 Family history of malignant neoplasm of breast: Secondary | ICD-10-CM

## 2021-07-05 DIAGNOSIS — J449 Chronic obstructive pulmonary disease, unspecified: Secondary | ICD-10-CM | POA: Diagnosis present

## 2021-07-05 DIAGNOSIS — M810 Age-related osteoporosis without current pathological fracture: Secondary | ICD-10-CM | POA: Diagnosis present

## 2021-07-05 DIAGNOSIS — G4700 Insomnia, unspecified: Secondary | ICD-10-CM | POA: Diagnosis present

## 2021-07-05 DIAGNOSIS — Z90711 Acquired absence of uterus with remaining cervical stump: Secondary | ICD-10-CM

## 2021-07-05 DIAGNOSIS — M797 Fibromyalgia: Secondary | ICD-10-CM | POA: Diagnosis present

## 2021-07-05 DIAGNOSIS — Z8249 Family history of ischemic heart disease and other diseases of the circulatory system: Secondary | ICD-10-CM | POA: Diagnosis not present

## 2021-07-05 DIAGNOSIS — F322 Major depressive disorder, single episode, severe without psychotic features: Secondary | ICD-10-CM | POA: Diagnosis not present

## 2021-07-05 DIAGNOSIS — Z20822 Contact with and (suspected) exposure to covid-19: Secondary | ICD-10-CM | POA: Diagnosis present

## 2021-07-05 DIAGNOSIS — Z823 Family history of stroke: Secondary | ICD-10-CM

## 2021-07-05 DIAGNOSIS — I509 Heart failure, unspecified: Secondary | ICD-10-CM | POA: Diagnosis present

## 2021-07-05 DIAGNOSIS — I110 Hypertensive heart disease with heart failure: Secondary | ICD-10-CM | POA: Diagnosis present

## 2021-07-05 DIAGNOSIS — Z79899 Other long term (current) drug therapy: Secondary | ICD-10-CM | POA: Diagnosis not present

## 2021-07-05 DIAGNOSIS — Z8505 Personal history of malignant neoplasm of liver: Secondary | ICD-10-CM | POA: Diagnosis not present

## 2021-07-05 DIAGNOSIS — R45851 Suicidal ideations: Secondary | ICD-10-CM | POA: Diagnosis present

## 2021-07-05 DIAGNOSIS — G8929 Other chronic pain: Secondary | ICD-10-CM | POA: Diagnosis present

## 2021-07-05 DIAGNOSIS — E039 Hypothyroidism, unspecified: Secondary | ICD-10-CM | POA: Diagnosis present

## 2021-07-05 DIAGNOSIS — Z87891 Personal history of nicotine dependence: Secondary | ICD-10-CM

## 2021-07-05 DIAGNOSIS — F332 Major depressive disorder, recurrent severe without psychotic features: Principal | ICD-10-CM | POA: Diagnosis present

## 2021-07-05 MED ORDER — NICOTINE 21 MG/24HR TD PT24
21.0000 mg | MEDICATED_PATCH | Freq: Every day | TRANSDERMAL | Status: DC
  Administered 2021-07-06 – 2021-07-12 (×3): 21 mg via TRANSDERMAL
  Filled 2021-07-05 (×6): qty 1

## 2021-07-05 MED ORDER — LORAZEPAM 0.5 MG PO TABS: 0.5000 mg | ORAL_TABLET | Freq: Every evening | ORAL | Status: DC | PRN

## 2021-07-05 MED ORDER — ALUM & MAG HYDROXIDE-SIMETH 200-200-20 MG/5ML PO SUSP
30.0000 mL | ORAL | Status: DC | PRN
Start: 2021-07-05 — End: 2021-07-16

## 2021-07-05 MED ORDER — HYDROXYZINE HCL 10 MG PO TABS: 5.0000 mg | ORAL_TABLET | Freq: Three times a day (TID) | ORAL | Status: DC | PRN

## 2021-07-05 MED ORDER — BUDESON-GLYCOPYRROL-FORMOTEROL 160-9-4.8 MCG/ACT IN AERO: 2.0000 | INHALATION_SPRAY | Freq: Two times a day (BID) | RESPIRATORY_TRACT | Status: DC

## 2021-07-05 MED ORDER — VITAMIN D 25 MCG (1000 UNIT) PO TABS
2000.0000 [IU] | ORAL_TABLET | Freq: Every day | ORAL | Status: DC
  Administered 2021-07-05 – 2021-07-16 (×12): 2000 [IU] via ORAL
  Filled 2021-07-05 (×12): qty 2

## 2021-07-05 MED ORDER — ACETAMINOPHEN 325 MG PO TABS
650.0000 mg | ORAL_TABLET | Freq: Four times a day (QID) | ORAL | Status: DC | PRN
  Administered 2021-07-05 – 2021-07-16 (×11): 650 mg via ORAL
  Filled 2021-07-05 (×12): qty 2

## 2021-07-05 MED ORDER — ASPIRIN-ACETAMINOPHEN-CAFFEINE 250-250-65 MG PO TABS
1.0000 | ORAL_TABLET | Freq: Four times a day (QID) | ORAL | Status: DC | PRN
  Filled 2021-07-05: qty 2

## 2021-07-05 MED ORDER — UMECLIDINIUM BROMIDE 62.5 MCG/ACT IN AEPB
1.0000 | INHALATION_SPRAY | Freq: Every day | RESPIRATORY_TRACT | Status: DC
  Administered 2021-07-05 – 2021-07-16 (×12): 1 via RESPIRATORY_TRACT
  Filled 2021-07-05 (×2): qty 7

## 2021-07-05 MED ORDER — ACETAMINOPHEN 325 MG PO TABS
650.0000 mg | ORAL_TABLET | Freq: Four times a day (QID) | ORAL | Status: DC | PRN
  Administered 2021-07-05: 650 mg via ORAL
  Filled 2021-07-05: qty 2

## 2021-07-05 MED ORDER — DULOXETINE HCL 30 MG PO CPEP
30.0000 mg | ORAL_CAPSULE | Freq: Every morning | ORAL | Status: DC
  Administered 2021-07-05: 30 mg via ORAL
  Filled 2021-07-05: qty 1

## 2021-07-05 MED ORDER — HYDROXYZINE HCL 25 MG PO TABS
25.0000 mg | ORAL_TABLET | Freq: Three times a day (TID) | ORAL | Status: DC | PRN
  Administered 2021-07-05: 25 mg via ORAL
  Filled 2021-07-05: qty 1

## 2021-07-05 MED ORDER — ALBUTEROL SULFATE (2.5 MG/3ML) 0.083% IN NEBU: 2.5000 mg | INHALATION_SOLUTION | RESPIRATORY_TRACT | Status: DC | PRN

## 2021-07-05 MED ORDER — BUSPIRONE HCL 10 MG PO TABS
20.0000 mg | ORAL_TABLET | Freq: Three times a day (TID) | ORAL | Status: DC
  Administered 2021-07-05: 20 mg via ORAL
  Filled 2021-07-05: qty 2

## 2021-07-05 MED ORDER — AMLODIPINE BESYLATE 5 MG PO TABS
2.5000 mg | ORAL_TABLET | Freq: Every day | ORAL | Status: DC
  Administered 2021-07-06: 2.5 mg via ORAL
  Filled 2021-07-05: qty 1

## 2021-07-05 MED ORDER — AMLODIPINE BESYLATE 5 MG PO TABS
2.5000 mg | ORAL_TABLET | Freq: Every day | ORAL | Status: DC
  Administered 2021-07-05: 2.5 mg via ORAL
  Filled 2021-07-05: qty 1

## 2021-07-05 MED ORDER — VITAMIN D3 50 MCG (2000 UT) PO CAPS: 2000.0000 [IU] | ORAL_CAPSULE | Freq: Every day | ORAL | Status: DC

## 2021-07-05 MED ORDER — PSEUDOEPHEDRINE-IBUPROFEN 30-200 MG PO CAPS: ORAL_CAPSULE | Freq: Every day | ORAL | Status: DC | PRN

## 2021-07-05 MED ORDER — PANTOPRAZOLE SODIUM 40 MG PO TBEC
80.0000 mg | DELAYED_RELEASE_TABLET | Freq: Every day | ORAL | Status: DC
Start: 2021-07-05 — End: 2021-07-05
  Administered 2021-07-05: 80 mg via ORAL
  Filled 2021-07-05: qty 2

## 2021-07-05 MED ORDER — HYDROCODONE-ACETAMINOPHEN 10-325 MG PO TABS
1.0000 | ORAL_TABLET | Freq: Four times a day (QID) | ORAL | Status: DC
  Administered 2021-07-05 – 2021-07-16 (×44): 1 via ORAL
  Filled 2021-07-05 (×44): qty 1

## 2021-07-05 MED ORDER — BUSPIRONE HCL 10 MG PO TABS
20.0000 mg | ORAL_TABLET | Freq: Three times a day (TID) | ORAL | Status: DC
  Administered 2021-07-05 – 2021-07-06 (×3): 20 mg via ORAL
  Filled 2021-07-05 (×3): qty 2

## 2021-07-05 MED ORDER — FLUTICASONE FUROATE-VILANTEROL 200-25 MCG/ACT IN AEPB
1.0000 | INHALATION_SPRAY | Freq: Every day | RESPIRATORY_TRACT | Status: DC
Start: 2021-07-05 — End: 2021-07-16
  Administered 2021-07-05 – 2021-07-16 (×12): 1 via RESPIRATORY_TRACT
  Filled 2021-07-05 (×2): qty 28

## 2021-07-05 MED ORDER — MELATONIN 5 MG PO TABS: 5.0000 mg | ORAL_TABLET | Freq: Every evening | ORAL | Status: DC | PRN

## 2021-07-05 MED ORDER — TRAZODONE HCL 50 MG PO TABS
50.0000 mg | ORAL_TABLET | Freq: Every evening | ORAL | Status: DC | PRN
Start: 2021-07-05 — End: 2021-07-16
  Filled 2021-07-05: qty 1

## 2021-07-05 MED ORDER — MAGNESIUM HYDROXIDE 400 MG/5ML PO SUSP: 30.0000 mL | Freq: Every day | ORAL | Status: DC | PRN

## 2021-07-05 MED ORDER — DULOXETINE HCL 30 MG PO CPEP
30.0000 mg | ORAL_CAPSULE | Freq: Every morning | ORAL | Status: DC
  Administered 2021-07-06: 30 mg via ORAL
  Filled 2021-07-05: qty 1

## 2021-07-05 NOTE — Progress Notes (Signed)
Patient admitted IVC to [facility] from [facility] ED with diagnosis of major depression. Patient was found unresponsive by daughter at [facility]. Patient admits to intentionally overdosing on Ativan and Norco. Patient presents to unit ambulatory with walker. Came in with walker from home. She is A&Ox4. Patient states, "I am tired of dealing with the pain" Patient's affect is sad/sullen, speech is clear and thoughts are organized. Patient endorses depression and anxiety stating her main stressors are her medical issues/pain?  Patient currently endorses suicidal ideations, denies homicidal ideations, audio or visual hallucinations and verbally contracts for safety on unit.  Reports chronic generalized pain 10/10 for which he takes Tylenol at home. Pt reports generalized weakness and using cane at home. Patient reports poor appetite. Denies incontinence and reports last BM yesterday.  Patient denies smoking or drug abuse or ETOH use. Patient reports living at retirement home. Has 3 daughters and 1 is in town. ? ?Emotional support and reassurance provided throughout admission intake. Afterwards, oriented patient to unit, room and call light, reviewed POC with all questions answered and understanding verbalzied.  Denies any needs at this time.  Will continue to monitor with ongoing Q 15 minute safety checks per unit protocol. ?

## 2021-07-05 NOTE — ED Notes (Signed)
Report given to RN at [facility] unit ?

## 2021-07-05 NOTE — Tx Team (Signed)
Initial Treatment Plan ?07/05/2021 ?3:25 PM ?GINNI EICHLER ?TKT:[id number] ? ? ? ?PATIENT STRESSORS: ?Health problems   ? ? ?PATIENT STRENGTHS: ?General knowledge ? ? ? ? ?PATIENT IDENTIFIED PROBLEMS: ?Chronic pain   ?Depressed mood   ?  ?  ?  ?  ?  ?  ?  ?  ? ?DISCHARGE CRITERIA:  ?Improved stabilization in mood, thinking, and/or behavior ? ?PRELIMINARY DISCHARGE PLAN: ?Return to previous living arrangement ? ?PATIENT/FAMILY INVOLVEMENT: ?This treatment plan has been presented to and reviewed with the patient, EVERLYN FARABAUGH, and/or family member, .  The patient and family have been given the opportunity to ask questions and make suggestions. ? ?Clarita Crane, RN ?07/05/2021, 3:25 PM ?

## 2021-07-05 NOTE — ED Provider Notes (Signed)
Emergency Medicine Observation Re-evaluation Note ? ?Tina Bailey is a 76 y.o. female, seen on rounds today.  Pt initially presented to the ED for complaints of Suicidal ?Currently, the patient is sleeping. ? ?Physical Exam  ?BP 136/72 (BP Location: Right Arm)   Pulse 77   Temp 97.8 ?F (36.6 ?C) (Oral)   Resp 18   SpO2 91%  ?Physical Exam ?General: asleep ?Cardiac: not assessed, asleep ?Lungs: normal effort ?Psych: asleep, not assessed ? ?ED Course / MDM  ?EKG:  ? ?I have reviewed the labs performed to date as well as medications administered while in observation.  Recent changes in the last 24 hours include hydrocodone, ativan, and nicotine patch ordered. ? ?Plan  ?Current plan is for inpatient admission to [facility]. Dr. Milana Bailey accepting. ? Tina Bailey is not under involuntary commitment. ? ? ?  ?Tina Gambler, MD ?07/05/21 [phone] ? ?

## 2021-07-05 NOTE — ED Notes (Signed)
Sheriff called for transport to [facility] rm 26 ?

## 2021-07-05 NOTE — ED Notes (Signed)
Pt walked to the bathroom w/walker without any difficulty. Pt back in triage room ?

## 2021-07-05 NOTE — Plan of Care (Signed)
?  Problem: Education: ?Goal: Knowledge of General Education information will improve ?Description: Including pain rating scale, medication(s)/side effects and non-pharmacologic comfort measures ?Outcome: Not Progressing ?  ?Problem: Health Behavior/Discharge Planning: ?Goal: Ability to manage health-related needs will improve ?Outcome: Not Progressing ?  ?Problem: Clinical Measurements: ?Goal: Ability to maintain clinical measurements within normal limits will improve ?Outcome: Not Progressing ?Goal: Will remain free from infection ?Outcome: Not Progressing ?Goal: Diagnostic test results will improve ?Outcome: Not Progressing ?Goal: Respiratory complications will improve ?Outcome: Not Progressing ?Goal: Cardiovascular complication will be avoided ?Outcome: Not Progressing ?  ?Problem: Activity: ?Goal: Risk for activity intolerance will decrease ?Outcome: Not Progressing ?  ?Problem: Nutrition: ?Goal: Adequate nutrition will be maintained ?Outcome: Not Progressing ?  ?Problem: Coping: ?Goal: Level of anxiety will decrease ?Outcome: Not Progressing ?  ?Problem: Elimination: ?Goal: Will not experience complications related to bowel motility ?Outcome: Not Progressing ?Goal: Will not experience complications related to urinary retention ?Outcome: Not Progressing ?  ?Problem: Pain Managment: ?Goal: General experience of comfort will improve ?Outcome: Not Progressing ?  ?Problem: Safety: ?Goal: Ability to remain free from injury will improve ?Outcome: Not Progressing ?  ?Problem: Skin Integrity: ?Goal: Risk for impaired skin integrity will decrease ?Outcome: Not Progressing ?  ?Problem: Education: ?Goal: Ability to make informed decisions regarding treatment will improve ?Outcome: Not Progressing ?  ?Problem: Coping: ?Goal: Coping ability will improve ?Outcome: Not Progressing ?  ?Problem: Health Behavior/Discharge Planning: ?Goal: Identification of resources available to assist in meeting health care needs will  improve ?Outcome: Not Progressing ?  ?Problem: Medication: ?Goal: Compliance with prescribed medication regimen will improve ?Outcome: Not Progressing ?  ?Problem: Self-Concept: ?Goal: Ability to disclose and discuss suicidal ideas will improve ?Outcome: Not Progressing ?Goal: Will verbalize positive feelings about self ?Outcome: Not Progressing ?  ?

## 2021-07-05 NOTE — Progress Notes (Signed)
Recreation Therapy Notes ? ?Date: 07/05/2021 ?  ?Time: 1:30 pm  ?  ?Location: Craft room    ?  ?Behavioral response: N/A ?  ?Intervention Topic: Time Management     ?  ?Discussion/Intervention: ?Patient refused to attend group.  ?  ?Clinical Observations/Feedback:  ?Patient refused to attend group.  ?  ?Sirr Kabel LRT/CTRS ? ? ? ? ? ? ? ?Michaelene Dutan ?07/05/2021 1:38 PM ?

## 2021-07-06 ENCOUNTER — Ambulatory Visit: Payer: Medicare HMO | Admitting: Diagnostic Neuroimaging

## 2021-07-06 DIAGNOSIS — F332 Major depressive disorder, recurrent severe without psychotic features: Secondary | ICD-10-CM | POA: Diagnosis not present

## 2021-07-06 MED ORDER — MIRTAZAPINE 15 MG PO TABS
15.0000 mg | ORAL_TABLET | Freq: Every day | ORAL | Status: DC
  Administered 2021-07-06 – 2021-07-15 (×10): 15 mg via ORAL
  Filled 2021-07-06 (×10): qty 1

## 2021-07-06 MED ORDER — AMLODIPINE BESYLATE 5 MG PO TABS
5.0000 mg | ORAL_TABLET | Freq: Every day | ORAL | Status: DC
  Administered 2021-07-07 – 2021-07-16 (×9): 5 mg via ORAL
  Filled 2021-07-06 (×10): qty 1

## 2021-07-06 MED ORDER — OLANZAPINE 5 MG PO TABS: 2.5000 mg | ORAL_TABLET | Freq: Four times a day (QID) | ORAL | Status: DC | PRN

## 2021-07-06 MED ORDER — RISPERIDONE 0.25 MG PO TABS
0.2500 mg | ORAL_TABLET | ORAL | Status: DC
  Administered 2021-07-06 – 2021-07-16 (×20): 0.25 mg via ORAL
  Filled 2021-07-06 (×22): qty 1

## 2021-07-06 MED ORDER — LORAZEPAM 1 MG PO TABS
1.0000 mg | ORAL_TABLET | Freq: Every evening | ORAL | Status: DC | PRN
  Filled 2021-07-06: qty 1

## 2021-07-06 NOTE — Evaluation (Signed)
Physical Therapy Evaluation ?Patient Details ?Name: Tina Bailey ?MRN: [id number] ?DOB: 04/16/1945 ?Today's Date: 07/06/2021 ? ?History of Present Illness ? Pt is a 76 y.o. female presenting to hospital 4/16 with chief complaint of suicidal intent and depression.  Seen in ED 2 days prior with presumed unintentional overdose (pt later reported it was a suicide attempt).  PMH includes chronic pain, fibromyalgia, O2 dependent, CHF, skin picking habit, TAA without rupture, COPD, and R IMN 2019  ?Clinical Impression ? Prior to hospital admission, pt was modified independent ambulating with rollator; lives at senior living community.  Pt reports she does not use supplemental O2 at home but uses it when she goes longer distances/community mobility.  Currently pt is modified independent with bed mobility, transfers, and ambulation within room using rollator.  O2 sats 93% at rest but 88% post mobility on room air.  Pt c/o 10/10 pain shooting from front (tender to palpation xiphoid process and just distal to xiphoid process) to the back/thoraic spine area (no tenderness to palpation noted thoracic spine)--nurse notified of pt's pain.  Attempted transverse abdominal isometrics in L sidelying (d/t pt feeling most comfortable in sidelying) but after a couple attempts pt verbalizing concerns about doing ex's correctly and then started crying and became SOB (pt appearing very anxious) so deferred further attempts at ex's.  Pt very pleasant and appearing motivated during session; pt also talkative and had difficulty focusing at times (anticipate d/t pt being anxious) requiring redirection.  Pt would benefit from skilled PT to address noted impairments and functional limitations (see below for any additional details).  Upon hospital discharge, pt would benefit from OP PT trial.  Anticipate pt may benefit from OT consult for ADL modification d/t pain limitations.   ? ?Recommendations for follow up therapy are one component of a  multi-disciplinary discharge planning process, led by the attending physician.  Recommendations may be updated based on patient status, additional functional criteria and insurance authorization. ? ?Follow Up Recommendations Outpatient PT ? ?  ?Assistance Recommended at Discharge PRN  ?Patient can return home with the following ? Assistance with cooking/housework;Assist for transportation ? ?  ?Equipment Recommendations Other (comment) (pt has own rollator)  ?Recommendations for Other Services ? OT consult  ?  ?Functional Status Assessment Patient has had a recent decline in their functional status and/or demonstrates limited ability to make significant improvements in function in a reasonable and predictable amount of time  ? ?  ?Precautions / Restrictions Precautions ?Precautions: Fall ?Precaution Comments: Uses O2 for longer ambulation distances ?Restrictions ?Weight Bearing Restrictions: No  ? ?  ? ?Mobility ? Bed Mobility ?Overal bed mobility: Modified Independent ?  ?  ?  ?  ?  ?  ?General bed mobility comments: Supine to/from sitting via logrolling ?  ? ?Transfers ?Overall transfer level: Modified independent ?Equipment used: Rollator (4 wheels) ?  ?  ?  ?  ?  ?  ?  ?General transfer comment: mild increased effort to stand but steady (x4 trials from bed and x1 trial from toilet) ?  ? ?Ambulation/Gait ?Ambulation/Gait assistance: Modified independent (Device/Increase time) ?Gait Distance (Feet):  (25 feet x2 (to/from bathroom)) ?Assistive device: Rollator (4 wheels) ?Gait Pattern/deviations: Step-through pattern ?  ?  ?  ?General Gait Details: steady ambulation with rollator use ? ?Stairs ?  ?  ?  ?  ?  ? ?Wheelchair Mobility ?  ? ?Modified Rankin (Stroke Patients Only) ?  ? ?  ? ?Balance Overall balance assessment: Needs assistance ?Sitting-balance support:  No upper extremity supported, Feet supported ?Sitting balance-Leahy Scale: Normal ?Sitting balance - Comments: steady sitting reaching outside BOS ?   ?Standing balance support: No upper extremity supported ?Standing balance-Leahy Scale: Good ?Standing balance comment: steady standing reaching within BOS ?  ?  ?  ?  ?  ?  ?  ?  ?  ?  ?  ?   ? ? ? ?Pertinent Vitals/Pain Pain Assessment ?Pain Assessment: 0-10 ?Pain Score: 10-Worst pain ever (tender to xiphoid process area but not to thoracic spine) ?Pain Location: xiphoid process and just distal to xiphoid process; also thoracic spine (pt reports pain pierces through straight to her back) ?Pain Descriptors / Indicators: Tender, Grimacing, Shooting ?Pain Intervention(s): Limited activity within patient's tolerance, Monitored during session, Premedicated before session, Repositioned, Patient requesting pain meds-RN notified  ? ? ?Home Living Family/patient expects to be discharged to:: Other (Comment) ?  ?  ?  ?  ?  ?  ?  ?  ?  ?Additional Comments: [place] (pt reports it is a Information systems manager access)  ?  ?Prior Function Prior Level of Function : Independent/Modified Independent ?  ?  ?  ?  ?  ?  ?Mobility Comments: Ambulatory with rollator; uses chronic O2 for longer distances but does not use O2 within home ?ADLs Comments: Modified independent ?  ? ? ?Hand Dominance  ?   ? ?  ?Extremity/Trunk Assessment  ? Upper Extremity Assessment ?Upper Extremity Assessment: Generalized weakness ?  ? ?Lower Extremity Assessment ?Lower Extremity Assessment: Generalized weakness ?  ? ?Cervical / Trunk Assessment ?Cervical / Trunk Assessment: Kyphotic  ?Communication  ? Communication: No difficulties  ?Cognition Arousal/Alertness: Awake/alert ?Behavior During Therapy: Anxious ?Overall Cognitive Status: Within Functional Limits for tasks assessed ?  ?  ?  ?  ?  ?  ?  ?  ?  ?  ?  ?  ?  ?  ?  ?  ?General Comments: Verbose; tangential; tearful at times ?  ?  ? ?  ?General Comments  Nursing cleared pt for participation in physical therapy.  Pt agreeable to PT session. ? ?  ?Exercises Other Exercises ?Other  Exercises: Attempted transverse abdominal isometrics in L sidelying (d/t pt feeling most comfortable in sidelying) but after a couple attempts pt verbalizing concerns about doing ex's correctly and then started crying and became SOB so deferred further attempts at ex's.  ? ?Assessment/Plan  ?  ?PT Assessment Patient needs continued PT services  ?PT Problem List Decreased strength;Decreased activity tolerance;Decreased mobility;Pain ? ?   ?  ?PT Treatment Interventions DME instruction;Gait training;Functional mobility training;Therapeutic activities;Therapeutic exercise;Balance training;Patient/family education   ? ?PT Goals (Current goals can be found in the Care Plan section)  ?Acute Rehab PT Goals ?Patient Stated Goal: to improve pain ?PT Goal Formulation: With patient ?Time For Goal Achievement: 07/20/21 ?Potential to Achieve Goals: Fair ? ?  ?Frequency Min 2X/week ?  ? ? ?Co-evaluation   ?  ?  ?  ?  ? ? ?  ?AM-PAC PT "6 Clicks" Mobility  ?Outcome Measure Help needed turning from your back to your side while in a flat bed without using bedrails?: None ?Help needed moving from lying on your back to sitting on the side of a flat bed without using bedrails?: None ?Help needed moving to and from a bed to a chair (including a wheelchair)?: None ?Help needed standing up from a chair using your arms (e.g., wheelchair or bedside chair)?: None ?Help needed  to walk in hospital room?: None ?Help needed climbing 3-5 steps with a railing? : None ?6 Click Score: 24 ? ?  ?End of Session   ?Activity Tolerance: Patient limited by pain ?Patient left: in bed;with call bell/phone within reach ?Nurse Communication: Mobility status;Precautions;Patient requests pain meds;Other (comment) (uses O2 for longer distances) ?PT Visit Diagnosis: Pain;Muscle weakness (generalized) (M62.81) ?  ? ?Time: 0712-1975 ?PT Time Calculation (min) (ACUTE ONLY): 36 min ? ? ?Charges:   PT Evaluation ?$PT Eval Low Complexity: 1 Low ?PT  Treatments ?$Therapeutic Exercise: 8-22 mins ?  ?   ? ?Leitha Bleak, PT ?07/06/21, 4:25 PM ? ? ?

## 2021-07-06 NOTE — [facility] Group Notes (Signed)
[facility] Group Notes:  (Nursing/MHT/Case Management/Adjunct) ? ?Date:  07/06/2021  ?Time:  10:00 AM ? ?Type of Therapy:  Psychoeducational Skills ? ?Participation Level:  Minimal ? ?Participation Quality:  Appropriate ? ?Affect:  Appropriate ? ?Cognitive:  Appropriate ? ?Insight:  Limited ? ?Engagement in Group:  Limited ? ?Modes of Intervention:  Discussion ? ?Summary of Progress/Problems: ?The pt attended group and had a limited response when asked to share during discussion. ?Juliette Alcide ?07/06/2021, 1:31 PM ?

## 2021-07-06 NOTE — Progress Notes (Addendum)
Patient is seen in the dayroom interacting appropriately with others on the unit at the beginning of the shift. She is cooperative and calm. She uses a front wheel walker brought with her from home. She is AAOx4. She is anxious and has crying spells.PRN Atarax 25 mg administered as per MAR orders. She denies SI, HI, and AVH. She is medication complaint. Emotional support given to patient. She is safe on the unit at this time. Q 15 minute safety checks in place.  ?

## 2021-07-06 NOTE — Progress Notes (Signed)
Recreation Therapy Notes ? ? ?Date: 07/06/2021 ?  ?Time: 1:35 pm  ?  ?Location: Courtyard     ?  ?Behavioral response: N/A ?  ?Intervention Topic: Leisure    ?  ?Discussion/Intervention: ?Patient refused to attend group.  ?  ?Clinical Observations/Feedback:  ?Patient refused to attend group.  ?  ?Tina Bailey LRT/CTRS ?  ? ? ? ? ? ? ? ?Tina Bailey ?07/06/2021 2:55 PM ?

## 2021-07-06 NOTE — Progress Notes (Signed)
Patient is alert and oriented x4. Patient verbalizes not sleeping so well last night. Patient denies SI, HI, AVH. Patient endorses Depression a 5/10 and anxiety is a 2/10. Patient rated pain a 10/10. Scheduled pain medication administered and patient verbalized some relief. Patient states that her pain is her trigger. Patient's affect is sad and sullen. Speech is clear and thoughts are organized. Patient ate breakfast and lunch in the day room. Medications administered as ordered. Patient participated in group briefly this morning. Emotional support provided. Patient remains safe on the unit with Q 15 minutes safety checks per unit protocol. ?

## 2021-07-06 NOTE — H&P (Signed)
Psychiatric Admission Assessment Adult ? ?Patient Identification: Tina Bailey ?MRN:  [id number] ?Date of Evaluation:  07/06/2021 ?Chief Complaint:  Severe recurrent major depression without psychotic features ([place]) [F33.2] ?Principal Diagnosis: Severe recurrent major depression without psychotic features ([place]) ?Diagnosis:  Principal Problem: ?  Severe recurrent major depression without psychotic features ([place]) ? ?History of Present Illness: Tina Bailey is a 76 year old white female who presents on involuntary commitment from [place].  She is currently residing at [facility] but plans on going to [place].  She has multiple medical problems including CHF, COPD, chronic back pain with bulging disks and osteophytes, hypothyroidism, and history of hepatic cellular carcinoma.  She also has a lot of abdominal pain which has been worked up.  MRI of her abdomen on 01/10/2021 showed possible intraductal papillary neoplasm but needs to be followed up.  Due to her chronic pain she endorses anhedonia, difficulty sleeping, depressed mood, anxiety and suicidal thoughts.  She does not have a plan.  She is being treated with Cymbalta and BuSpar and I discussed changing her medicines and she was okay with that. ? ?PER INITIAL INTAKE: ?Pt is a 76 year old divorced female who presents unaccompanied to [facility] ED reporting symptoms of depression and suicidal ideation. She was seen in the emergency department 2 days ago and presumed to have unintentional overdose however today Pt states she told her daughter this was a suicide attempt. Pt reports she ingested 15 tabs of lorazepam. She continues to express suicidal ideation and says she is angry at herself for not taking enough medication to kill her. She states she is embarrassed to be in her current situation. She says she has experienced chronic pain for years due to osteoporosis. She explains that every days she wakes up in pain. She says she has been very diligent in  taking her medications and forcing herself to be active but "now I finally realized there is no point. It's only going to get worse." She denies history of depression but says she has felt depressed and hopeless recently. Pt acknowledges symptoms including crying spells, social withdrawal, loss of interest in usual pleasures, fatigue, irritability, decreased concentration, and feelings of guilt, worthlessness and hopelessness. She denies history of suicide attempts. Pt denies any history of intentional self-injurious behaviors. Pt denies current homicidal ideation or history of violence. Pt denies any history of auditory or visual hallucinations. Pt denies history of alcohol or other substance use. ?  ?Pt identifies chronic pain as her primary stressor. She says she is no longer able to do the things she loves, such as play guitar. She states she lives in a senior living community and can perform ADLs independently but "it's getting harder." She says she has three children, two who live out of state and a daughter, [name], who lives locally and is supportive. Pt denies history of inpatient or outpatient mental health treatment. She denies history of abuse. She denies legal problems. She denies access to firearms. ?  ?Pt is dressed in hospital scrubs, alert and oriented x4. Pt speaks in a clear tone, at moderate volume and normal pace. Motor behavior appears normal. Eye contact is good and Pt is at times tearful. Pt's mood is depressed and anxious: affect is congruent with mood. Thought process is coherent and relevant. There is no indication Pt is currently responding to internal stimuli or experiencing delusional thought content. She is cooperative but says she does not want to be psychiatrically hospitalized. Pt has been placed under involuntary commitment. ? ?Associated  Signs/Symptoms: ?Depression Symptoms:  depressed mood, ?anhedonia, ?insomnia, ?hopelessness, ?suicidal attempt, ?disturbed sleep, ?weight  loss, ?Duration of Depression Symptoms: Greater than two weeks ? ?(Hypo) Manic Symptoms:  Irritable Mood, ?Anxiety Symptoms:  Social Anxiety, ?Psychotic Symptoms:   None ?PTSD Symptoms: ?NA ?Total Time spent with patient: 1 hour ? ?Past Psychiatric History: None ? ?Is the patient at risk to self? Yes.    ?Has the patient been a risk to self in the past 6 months? Yes.    ?Has the patient been a risk to self within the distant past? No.  ?Is the patient a risk to others? No.  ?Has the patient been a risk to others in the past 6 months? No.  ?Has the patient been a risk to others within the distant past? No.  ? ?Prior Inpatient Therapy:   ?Prior Outpatient Therapy:   ? ?Alcohol Screening: 1. How often do you have a drink containing alcohol?: Never ?2. How many drinks containing alcohol do you have on a typical day when you are drinking?: 1 or 2 ?3. How often do you have six or more drinks on one occasion?: Never ?AUDIT-C Score: 0 ?4. How often during the last year have you found that you were not able to stop drinking once you had started?: Never ?5. How often during the last year have you failed to do what was normally expected from you because of drinking?: Never ?6. How often during the last year have you needed a first drink in the morning to get yourself going after a heavy drinking session?: Never ?7. How often during the last year have you had a feeling of guilt of remorse after drinking?: Never ?8. How often during the last year have you been unable to remember what happened the night before because you had been drinking?: Never ?9. Have you or someone else been injured as a result of your drinking?: No ?10. Has a relative or friend or a doctor or another health worker been concerned about your drinking or suggested you cut down?: No ?Alcohol Use Disorder Identification Test Final Score (AUDIT): 0 ?Substance Abuse History in the last 12 months:  No. ?Consequences of Substance Abuse: ?NA ?Previous Psychotropic  Medications: Yes  ?Psychological Evaluations: No  ?Past Medical History:  ?Past Medical History:  ?Diagnosis Date  ? Arthritis   ? DDD. Right shoulder"is frozen"-limited ROM. osteoporosis.  ? Blue toe syndrome ([place]) 11/27/2014  ? Dr. Claudia Pollock evaluating  ? CHF (congestive heart failure) ([place])   ? Complication of anesthesia   ? COPD (chronic obstructive pulmonary disease) ([place])   ? Fibromyalgia   ? Fracture of rib of right side   ? hx "osteoporosis"- states her dog nudge her on the side, next day developed great pain and was told has a fracture rib.  ? Liver cancer ([place]) 12/2014  ? Macular degeneration   ? R eye  ? Osteoporosis   ? PONV (postoperative nausea and vomiting)   ? nausea,severe vomiting after 01-13-15 portal vein embolization  ? Productive cough   ? Retina disorder   ? L eye, vision distorted, edema  ? Spine fracture due to birth trauma   ? TMJ disease   ? Wears glasses   ?  ?Past Surgical History:  ?Procedure Laterality Date  ? ANGIOPLASTY  2008  ? no stents required, no follow-up with cardiologist, no recurrent chest pain  ? CATARACT EXTRACTION, BILATERAL Bilateral   ? ESOPHAGOGASTRODUODENOSCOPY (EGD) WITH PROPOFOL N/A 06/25/2015  ? Procedure: ESOPHAGOGASTRODUODENOSCOPY (EGD)  WITH PROPOFOL;  Surgeon: Milus Banister, MD;  Location: [facility];  Service: Endoscopy;  Laterality: N/A;  stent removal   ? EYE SURGERY    ? cornea surgery  ? INTRAMEDULLARY (IM) NAIL INTERTROCHANTERIC Right 12/04/2017  ? Procedure: INTRAMEDULLARY (IM) NAIL INTERTROCHANTRIC;  Surgeon: Rod Can, MD;  Location: [facility];  Service: Orthopedics;  Laterality: Right;  ? IR RADIOLOGIST EVAL & MGMT  03/29/2017  ? KIDNEY STONE SURGERY    ? LAPAROSCOPIC PARTIAL HEPATECTOMY N/A 03/04/2015  ? Procedure: DIAGNOSTIC LAPAROSCOPY, EXTENDED RIGHT HEPATECTOMY, WITH INTRAOPERATIVE ULTRASOUND;  Surgeon: Stark Klein, MD;  Location: [facility];  Service: General;  Laterality: N/A;  ? PARTIAL HYSTERECTOMY    ? portal vein embolization    ? 01-13-15  -Dr. Cathlean Sauer.  ? ?Family History:  ?Family History  ?Problem Relation Age of Onset  ? Hypertension Mother   ? Atrial fibrillation Mother   ? Heart disease Mother   ?     after age 76  ? Breast cancer

## 2021-07-06 NOTE — [facility] Counselor (Signed)
CSW made 1st attempt to complete PSA with pt, however pt could not be roused from sleep. CSW will follow up at another time to complete PSA.  ? ?[place], MSW, LCSW-A ?4/18/20231:43 PM  ?

## 2021-07-06 NOTE — [facility] Suicide Risk Assessment (Signed)
[facility] Admission Suicide Risk Assessment ? ? ?Nursing information obtained from:    ?Demographic factors:  Caucasian, Unemployed, Age 76 or older ?Current Mental Status:  Suicidal ideation indicated by patient ?Loss Factors:  NA ?Historical Factors:  NA ?Risk Reduction Factors:  NA ? ?Total Time spent with patient: 1 hour ?Principal Problem: Severe recurrent major depression without psychotic features ([employer]) ?Diagnosis:  Principal Problem: ?  Severe recurrent major depression without psychotic features ([place]) ? ?Subjective Data: Pt is a 76 year old divorced female who presents unaccompanied to [facility] reporting symptoms of depression and suicidal ideation. She was seen in the emergency department 2 days ago and presumed to have unintentional overdose however today Pt states she told her daughter this was a suicide attempt. Pt reports she ingested 15 tabs of lorazepam. She continues to express suicidal ideation and says she is angry at herself for not taking enough medication to kill her. She states she is embarrassed to be in her current situation. She says she has experienced chronic pain for years due to osteoporosis. She explains that every days she wakes up in pain. She says she has been very diligent in taking her medications and forcing herself to be active but "now I finally realized there is no point. It's only going to get worse." She denies history of depression but says she has felt depressed and hopeless recently. Pt acknowledges symptoms including crying spells, social withdrawal, loss of interest in usual pleasures, fatigue, irritability, decreased concentration, and feelings of guilt, worthlessness and hopelessness. She denies history of suicide attempts. Pt denies any history of intentional self-injurious behaviors. Pt denies current homicidal ideation or history of violence. Pt denies any history of auditory or visual hallucinations. Pt denies history of alcohol or other substance use. ?  ?Pt  identifies chronic pain as her primary stressor. She says she is no longer able to do the things she loves, such as play guitar. She states she lives in a senior living community and can perform ADLs independently but "it's getting harder." She says she has three children, two who live out of state and a daughter, [name], who lives locally and is supportive. Pt denies history of inpatient or outpatient mental health treatment. She denies history of abuse. She denies legal problems. She denies access to firearms. ?  ?Pt is dressed in hospital scrubs, alert and oriented x4. Pt speaks in a clear tone, at moderate volume and normal pace. Motor behavior appears normal. Eye contact is good and Pt is at times tearful. Pt's mood is depressed and anxious: affect is congruent with mood. Thought process is coherent and relevant. There is no indication Pt is currently responding to internal stimuli or experiencing delusional thought content. She is cooperative but says she does not want to be psychiatrically hospitalized. Pt has been placed under involuntary commitment. ? ?Continued Clinical Symptoms:  ?Alcohol Use Disorder Identification Test Final Score (AUDIT): 0 ?The "Alcohol Use Disorders Identification Test", Guidelines for Use in Primary Care, Second Edition.  [employer]). ?Score between 0-7:  no or low risk or alcohol related problems. ?Score between 8-15:  moderate risk of alcohol related problems. ?Score between 16-19:  high risk of alcohol related problems. ?Score 20 or above:  warrants further diagnostic evaluation for alcohol dependence and treatment. ? ? ?CLINICAL FACTORS:  ? Chronic Pain ? ? ?Musculoskeletal: ?Strength & Muscle Tone: decreased ?Gait & Station: unsteady ?Patient leans: N/A ? ?Psychiatric Specialty Exam: ? ?Presentation  ?General Appearance: No data recorded ?  Eye Contact:No data recorded ?Speech:No data recorded ?Speech Volume:No data recorded ?Handedness:No data recorded ? ?Mood  and Affect  ?Mood:No data recorded ?Affect:No data recorded ? ?Thought Process  ?Thought Processes:No data recorded ?Descriptions of Associations:No data recorded ?Orientation:No data recorded ?Thought Content:No data recorded ?History of Schizophrenia/Schizoaffective disorder:No ? ?Duration of Psychotic Symptoms:No data recorded ?Hallucinations:No data recorded ?Ideas of Reference:No data recorded ?Suicidal Thoughts:No data recorded ?Homicidal Thoughts:No data recorded ? ?Sensorium  ?Memory:No data recorded ?Judgment:No data recorded ?Insight:No data recorded ? ?Executive Functions  ?Concentration:No data recorded ?Attention Span:No data recorded ?Recall:No data recorded ?[place] of [place] recorded ?Language:No data recorded ? ?Psychomotor Activity  ?Psychomotor Activity:No data recorded ? ?Assets  ?Assets:No data recorded ? ?Sleep  ?Sleep:No data recorded ? ? ?Blood pressure (!) 149/93, pulse 93, temperature 97.7 ?F (36.5 ?C), temperature source Oral, resp. rate 18, height '5\' 1"'$  (1.549 m), weight 127.2 kg, SpO2 94 %. Body mass index is 52.99 kg/m?. ? ? ?COGNITIVE FEATURES THAT CONTRIBUTE TO RISK:  ?None   ? ?SUICIDE RISK:  ? Minimal: No identifiable suicidal ideation.  Patients presenting with no risk factors but with morbid ruminations; may be classified as minimal risk based on the severity of the depressive symptoms ? ?PLAN OF CARE: See orders ? ?I certify that inpatient services furnished can reasonably be expected to improve the patient's condition.  ? ?Parks Ranger, DO ?07/06/2021, 10:38 AM ?  , 10:38 AM ? ?

## 2021-07-07 DIAGNOSIS — F332 Major depressive disorder, recurrent severe without psychotic features: Secondary | ICD-10-CM | POA: Diagnosis not present

## 2021-07-07 MED ORDER — GABAPENTIN 100 MG PO CAPS
100.0000 mg | ORAL_CAPSULE | Freq: Three times a day (TID) | ORAL | Status: DC
  Administered 2021-07-07 – 2021-07-08 (×3): 100 mg via ORAL
  Filled 2021-07-07 (×3): qty 1
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
## 2021-07-07 NOTE — Progress Notes (Signed)
[facility] MD Progress Note ? ?07/07/2021 12:53 PM ?Shirl Harris [place]  ?MRN:  [id number] ?Subjective: Tina Bailey is seen on rounds today.  We started her on a low-dose of Risperdal and Remeron at bedtime.  She says that she has trouble sleeping because of all of her pain.  She says she has a history of fibromyalgia and I talked to her about starting Neurontin and she was agreeable.  She says that she has never been on it before.  I told her it could help with mood and pain so we will start at a lower dose is to see if she tolerates it.  Physical therapy did meet with her and thought that she could benefit from ongoing PT.  She currently denies any suicidal thoughts but does feel depressed about her current physical situation. ? ?Principal Problem: Severe recurrent major depression without psychotic features ([place]) ?Diagnosis: Principal Problem: ?  Severe recurrent major depression without psychotic features ([place]) ? ?Total Time spent with patient: 15 minutes ? ?Past Psychiatric History: None ? ?Past Medical History:  ?Past Medical History:  ?Diagnosis Date  ? Arthritis   ? DDD. Right shoulder"is frozen"-limited ROM. osteoporosis.  ? Blue toe syndrome ([place]) 11/27/2014  ? Dr. Claudia Pollock evaluating  ? CHF (congestive heart failure) ([place])   ? Complication of anesthesia   ? COPD (chronic obstructive pulmonary disease) ([place])   ? Fibromyalgia   ? Fracture of rib of right side   ? hx "osteoporosis"- states her dog nudge her on the side, next day developed great pain and was told has a fracture rib.  ? Liver cancer ([place]) 12/2014  ? Macular degeneration   ? R eye  ? Osteoporosis   ? PONV (postoperative nausea and vomiting)   ? nausea,severe vomiting after 01-13-15 portal vein embolization  ? Productive cough   ? Retina disorder   ? L eye, vision distorted, edema  ? Spine fracture due to birth trauma   ? TMJ disease   ? Wears glasses   ?  ?Past Surgical History:  ?Procedure Laterality Date  ? ANGIOPLASTY  2008  ? no stents required, no  follow-up with cardiologist, no recurrent chest pain  ? CATARACT EXTRACTION, BILATERAL Bilateral   ? ESOPHAGOGASTRODUODENOSCOPY (EGD) WITH PROPOFOL N/A 06/25/2015  ? Procedure: ESOPHAGOGASTRODUODENOSCOPY (EGD) WITH PROPOFOL;  Surgeon: Milus Banister, MD;  Location: [facility];  Service: Endoscopy;  Laterality: N/A;  stent removal   ? EYE SURGERY    ? cornea surgery  ? INTRAMEDULLARY (IM) NAIL INTERTROCHANTERIC Right 12/04/2017  ? Procedure: INTRAMEDULLARY (IM) NAIL INTERTROCHANTRIC;  Surgeon: Rod Can, MD;  Location: [facility];  Service: Orthopedics;  Laterality: Right;  ? IR RADIOLOGIST EVAL & MGMT  03/29/2017  ? KIDNEY STONE SURGERY    ? LAPAROSCOPIC PARTIAL HEPATECTOMY N/A 03/04/2015  ? Procedure: DIAGNOSTIC LAPAROSCOPY, EXTENDED RIGHT HEPATECTOMY, WITH INTRAOPERATIVE ULTRASOUND;  Surgeon: Stark Klein, MD;  Location: [facility];  Service: General;  Laterality: N/A;  ? PARTIAL HYSTERECTOMY    ? portal vein embolization    ? 01-13-15 -Dr. Cathlean Sauer.  ? ?Family History:  ?Family History  ?Problem Relation Age of Onset  ? Hypertension Mother   ? Atrial fibrillation Mother   ? Heart disease Mother   ?     after age 32  ? Breast cancer Mother   ? Stroke Father   ? Dementia Father   ? Peptic Ulcer Father   ? Heart disease Brother   ?     After age 22- A-Fib  ?  Heart attack Brother   ? ? ?Social History:  ?Social History  ? ?Substance and Sexual Activity  ?Alcohol Use No  ? Alcohol/week: 0.0 standard drinks  ?   ?Social History  ? ?Substance and Sexual Activity  ?Drug Use No  ?  ?Social History  ? ?Socioeconomic History  ? Marital status: Divorced  ?  Spouse name: Not on file  ? Number of children: 3  ? Years of education: Not on file  ? Highest education level: Not on file  ?Occupational History  ? Occupation: retired  ?Tobacco Use  ? Smoking status: Former  ?  Packs/day: 1.00  ?  Years: 45.00  ?  Pack years: 45.00  ?  Types: Cigarettes  ?  Quit date: 02/20/2018  ?  Years since quitting: 3.3  ? Smokeless tobacco:  Never  ?Vaping Use  ? Vaping Use: Never used  ?Substance and Sexual Activity  ? Alcohol use: No  ?  Alcohol/week: 0.0 standard drinks  ? Drug use: No  ? Sexual activity: Not Currently  ?Other Topics Concern  ? Not on file  ?Social History Narrative  ? Not on file  ? ?Social Determinants of Health  ? ?Financial Resource Strain: Not on file  ?Food Insecurity: Not on file  ?Transportation Needs: Not on file  ?Physical Activity: Not on file  ?Stress: Not on file  ?Social Connections: Not on file  ? ?Additional Social History:  ?  ?  ?  ?  ?  ?  ?  ?  ?  ?  ?  ? ?Sleep: Poor ? ?Appetite:  Fair ? ?Current Medications: ?Current Facility-Administered Medications  ?Medication Dose Route Frequency Provider Last Rate Last Admin  ? acetaminophen (TYLENOL) tablet 650 mg  650 mg Oral Q6H PRN Clapacs, John T, MD   650 mg at 07/07/21 1119  ? albuterol (PROVENTIL) (2.5 MG/3ML) 0.083% nebulizer solution 2.5 mg  2.5 mg Nebulization Q4H PRN Clapacs, John T, MD      ? alum & mag hydroxide-simeth (MAALOX/MYLANTA) 200-200-20 MG/5ML suspension 30 mL  30 mL Oral Q4H PRN Clapacs, John T, MD      ? amLODipine (NORVASC) tablet 5 mg  5 mg Oral Daily Parks Ranger, DO   5 mg at 07/07/21 0940  ? aspirin-acetaminophen-caffeine (EXCEDRIN MIGRAINE) per tablet 1-2 tablet  1-2 tablet Oral Q6H PRN Clapacs, Madie Reno, MD      ? cholecalciferol (VITAMIN D3) tablet 2,000 Units  2,000 Units Oral QPC lunch Clapacs, Madie Reno, MD   2,000 Units at 07/07/21 1248  ? fluticasone furoate-vilanterol (BREO ELLIPTA) 200-25 MCG/ACT 1 puff  1 puff Inhalation Daily Benita Gutter, RPH   1 puff at 07/07/21 7322  ? gabapentin (NEURONTIN) capsule 100 mg  100 mg Oral TID Parks Ranger, DO      ? HYDROcodone-acetaminophen [facility]) 10-325 MG per tablet 1 tablet  1 tablet Oral Q6H Clapacs, Madie Reno, MD   1 tablet at 07/07/21 0940  ? LORazepam (ATIVAN) tablet 1 mg  1 mg Oral QHS PRN Parks Ranger, DO      ? magnesium hydroxide (MILK OF MAGNESIA)  suspension 30 mL  30 mL Oral Daily PRN Clapacs, John T, MD      ? mirtazapine (REMERON) tablet 15 mg  15 mg Oral QHS Parks Ranger, DO   15 mg at 07/06/21 2149  ? nicotine (NICODERM CQ - dosed in mg/24 hours) patch 21 mg  21 mg Transdermal Daily Clapacs, Madie Reno,  MD   21 mg at 07/07/21 0940  ? OLANZapine (ZYPREXA) tablet 2.5 mg  2.5 mg Oral Q6H PRN Parks Ranger, DO      ? risperiDONE (RISPERDAL) tablet 0.25 mg  0.25 mg Oral [id number] Parks Ranger, DO   0.25 mg at 07/07/21 0809  ? traZODone (DESYREL) tablet 50 mg  50 mg Oral QHS PRN Clapacs, John T, MD      ? umeclidinium bromide (INCRUSE ELLIPTA) 62.5 MCG/ACT 1 puff  1 puff Inhalation Daily Benita Gutter, RPH   1 puff at 07/07/21 0809  ? ? ?Lab Results: No results found for this or any previous visit (from the past 48 hour(s)). ? ?Blood Alcohol level:  ?Lab Results  ?Component Value Date  ? ETH <10 07/04/2021  ? ? ?Metabolic Disorder Labs: ?Lab Results  ?Component Value Date  ? HGBA1C 6.2 05/24/2021  ? ?No results found for: PROLACTIN ?Lab Results  ?Component Value Date  ? CHOL 190 05/24/2021  ? TRIG 65.0 05/24/2021  ? HDL 90.80 05/24/2021  ? CHOLHDL 2 05/24/2021  ? VLDL 13.0 05/24/2021  ? [place] 86 05/24/2021  ? [place] 82 01/09/2017  ? ? ?Physical Findings: ?AIMS:  , ,  ,  ,    ?CIWA:    ?COWS:    ? ?Musculoskeletal: ?Strength & Muscle Tone: within normal limits ?Gait & Station: unsteady ?Patient leans: N/A ? ?Psychiatric Specialty Exam: ? ?Presentation  ?General Appearance: No data recorded ?Eye Contact:No data recorded ?Speech:No data recorded ?Speech Volume:No data recorded ?Handedness:No data recorded ? ?Mood and Affect  ?Mood:No data recorded ?Affect:No data recorded ? ?Thought Process  ?Thought Processes:No data recorded ?Descriptions of Associations:No data recorded ?Orientation:No data recorded ?Thought Content:No data recorded ?History of Schizophrenia/Schizoaffective disorder:No ? ?Duration of Psychotic Symptoms:No data  recorded ?Hallucinations:No data recorded ?Ideas of Reference:No data recorded ?Suicidal Thoughts:No data recorded ?Homicidal Thoughts:No data recorded ? ?Sensorium  ?Memory:No data recorded ?Judgment:No data reco

## 2021-07-07 NOTE — Evaluation (Signed)
Occupational Therapy Evaluation ?Patient Details ?Name: Tina Bailey ?MRN: [id number] ?DOB: [birth year] ?Today's Date: 07/07/2021 ? ? ?History of Present Illness Pt is a 76 y.o. female presenting to hospital 4/16 with chief complaint of suicidal intent and depression.  Seen in ED 2 days prior with presumed unintentional overdose (pt later reported it was a suicide attempt).  PMH includes chronic pain, fibromyalgia, O2 dependent, CHF, skin picking habit, TAA without rupture, COPD, and R IMN 2019  ? ?Clinical Impression ?  ?Patient presenting with decreased Ind in self care, balance, functional mobility/transfers, endurance, and safety awareness. Patient reports living in an senior living and able to ambulate with rollator. She endorses being independent in self care tasks but chronic back pain for several years. Pt given pain medication during assessment and she is able to move without assistance but with increasing pain. OT reviewed some back precautions that could be performed for comfort with pt performing log roll for comfort to EOB. Pt stands without physical assistance but unable to take steps secondary to pain. She is able to demonstrates figure four position for LB clothing. Patient will benefit from acute OT to increase overall independence in the areas of ADLs, functional mobility, and safety awareness in order to safely discharge home.  ?   ? ?Recommendations for follow up therapy are one component of a multi-disciplinary discharge planning process, led by the attending physician.  Recommendations may be updated based on patient status, additional functional criteria and insurance authorization.  ? ?Follow Up Recommendations ? No OT follow up  ?  ?Assistance Recommended at Discharge PRN  ?Patient can return home with the following Two people to help with bathing/dressing/bathroom;Assistance with cooking/housework;Assist for transportation;Direct supervision/assist for financial management;Direct  supervision/assist for medications management ? ?  ?Functional Status Assessment ? Patient has had a recent decline in their functional status and demonstrates the ability to make significant improvements in function in a reasonable and predictable amount of time.  ?Equipment Recommendations ? Other (comment) (may benefit from [facility] reacher to decrease pain pain with self care tasks)  ?  ?   ?Precautions / Restrictions Precautions ?Precautions: Fall ?Restrictions ?Weight Bearing Restrictions: No  ? ?  ? ?Mobility Bed Mobility ?Overal bed mobility: Modified Independent, Needs Assistance ?  ?  ?  ?  ?  ?  ?General bed mobility comments: Supine to/from sitting with cuing for log roll ?  ? ?Transfers ?Overall transfer level: Modified independent ?Equipment used: Rollator (4 wheels) ?  ?  ?  ?  ?  ?  ?  ?General transfer comment: sit <>stand at mod I level but increased pain noted for task ?  ? ?  ?Balance Overall balance assessment: Needs assistance ?Sitting-balance support: No upper extremity supported, Feet supported ?Sitting balance-Leahy Scale: Normal ?Sitting balance - Comments: steady sitting reaching outside BOS ?  ?Standing balance support: No upper extremity supported ?Standing balance-Leahy Scale: Good ?Standing balance comment: steady standing reaching within BOS ?  ?  ?  ?  ?  ?  ?  ?  ?  ?  ?  ?   ? ?ADL either performed or assessed with clinical judgement  ? ?ADL   ?  ?  ?  ?  ?  ?  ?  ?  ?  ?  ?  ?  ?  ?  ?  ?  ?  ?  ?  ?General ADL Comments: demonstrates figure four position with cuing for technique to doff/don socks  ? ? ? ?  Vision Patient Visual Report: No change from baseline ?   ?   ?   ?   ? ?Pertinent Vitals/Pain Pain Assessment ?Pain Assessment: 0-10 ?Pain Score: 10-Worst pain ever ?Pain Location: xiphoid process and just distal to xiphoid process; also thoracic spine (pt reports pain pierces through straight to her back) ?Pain Descriptors / Indicators: Tender, Grimacing, Shooting ?Pain  Intervention(s): Limited activity within patient's tolerance, Monitored during session, Repositioned, RN gave pain meds during session  ? ? ? ?Hand Dominance Right ?  ?Extremity/Trunk Assessment Upper Extremity Assessment ?Upper Extremity Assessment: Generalized weakness ?  ?Lower Extremity Assessment ?Lower Extremity Assessment: Generalized weakness ?  ?Cervical / Trunk Assessment ?Cervical / Trunk Assessment: Kyphotic ?  ?Communication Communication ?Communication: No difficulties ?  ?Cognition Arousal/Alertness: Awake/alert ?Behavior During Therapy: Anxious ?Overall Cognitive Status: Within Functional Limits for tasks assessed ?  ?  ?  ?  ?  ?  ?  ?  ?  ?  ?  ?  ?  ?  ?  ?  ?General Comments: Verbose and tangential but very pleasant ?  ?  ?   ?   ?   ? ? ?Home Living Family/patient expects to be discharged to:: Other (Comment) ?  ?  ?  ?  ?  ?  ?  ?  ?  ?  ?  ?  ?  ?  ?  ?  ?Additional Comments: [place] (pt reports it is a Information systems manager access) ?  ? ?  ?Prior Functioning/Environment Prior Level of Function : Independent/Modified Independent ?  ?  ?  ?  ?  ?  ?Mobility Comments: Ambulatory with rollator; uses chronic O2 for longer distances but does not use O2 within home ?ADLs Comments: Modified independent ?  ? ?  ?  ?OT Problem List: Decreased strength;Decreased activity tolerance;Impaired balance (sitting and/or standing);Impaired UE functional use;Decreased knowledge of use of DME or AE;Decreased safety awareness;Pain ?  ?   ?OT Treatment/Interventions: Self-care/ADL training;Therapeutic exercise;Therapeutic activities;Energy conservation;DME and/or AE instruction;Patient/family education;Manual therapy;Balance training  ?  ?OT Goals(Current goals can be found in the care plan section) Acute Rehab OT Goals ?Patient Stated Goal: to decrease pain ?OT Goal Formulation: With patient ?Time For Goal Achievement: 07/21/21 ?Potential to Achieve Goals: Fair ?ADL Goals ?Pt Will Perform  Upper Body Bathing: with modified independence;standing ?Pt Will Perform Lower Body Bathing: with modified independence;sit to/from stand ?Pt Will Perform Upper Body Dressing: with modified independence;standing ?Pt Will Perform Lower Body Dressing: with modified independence;sit to/from stand ?Pt Will Transfer to Toilet: with modified independence;ambulating ?Pt Will Perform Toileting - Clothing Manipulation and hygiene: with modified independence;sit to/from stand ?Pt Will Perform Tub/Shower Transfer: with modified independence;ambulating  ?OT Frequency: Min 2X/week ?  ? ?   ?AM-PAC OT "6 Clicks" Daily Activity     ?Outcome Measure Help from another person eating meals?: None ?Help from another person taking care of personal grooming?: None ?Help from another person toileting, which includes using toliet, bedpan, or urinal?: None ?Help from another person bathing (including washing, rinsing, drying)?: None ?Help from another person to put on and taking off regular upper body clothing?: None ?Help from another person to put on and taking off regular lower body clothing?: None ?6 Click Score: 24 ?  ?End of Session Equipment Utilized During Treatment: Rollator (4 wheels) ?Nurse Communication: Mobility status;Other (comment) (pain and plan for shower tomorrow) ? ?Activity Tolerance: Patient limited by pain ?Patient left: in bed;with call bell/phone within reach ? ?  OT Visit Diagnosis: Unsteadiness on feet (R26.81);Muscle weakness (generalized) (M62.81);Pain ?Pain - part of body:  (back)  ?              ?Time: 2099-0689 ?OT Time Calculation (min): 19 min ?Charges:  OT General Charges ?$OT Visit: 1 Visit ?OT Evaluation ?$OT Eval Low Complexity: 1 Low ?OT Treatments ?$Self Care/Home Management : 8-22 mins ? ?Darleen Crocker, [place], OTR/L , CBIS ?ascom [phone]  ?07/07/21, 4:27 PM  ?

## 2021-07-07 NOTE — Progress Notes (Signed)
Recreation Therapy Notes ? ?Date: 07/07/2021 ?  ?Time: 1:35 pm  ?  ?Location: Craft room    ?  ?Behavioral response: N/A ? ?Intervention Topic: Self-esteem   ?  ?Discussion/Intervention:  ?Patient refused to attend group. ? ?Clinical Observations/Feedback: ?Patient refused to attend group. ? ?Damiel Barthold LRT/CTRS  ? ? ? ? ? ? ? ? ?Cavon Nicolls ?07/07/2021 3:12 PM ?

## 2021-07-07 NOTE — BH IP Treatment Plan (Signed)
Interdisciplinary Treatment and Diagnostic Plan Update ? ?07/07/2021 ?Time of Session: 9:30AM ?Tina Bailey ?MRN: [id number] ? ?Principal Diagnosis: Severe recurrent major depression without psychotic features ([place]) ? ?Secondary Diagnoses: Principal Problem: ?  Severe recurrent major depression without psychotic features ([place]) ? ? ?Current Medications:  ?Current Facility-Administered Medications  ?Medication Dose Route Frequency Provider Last Rate Last Admin  ? acetaminophen (TYLENOL) tablet 650 mg  650 mg Oral Q6H PRN Clapacs, John T, MD   650 mg at 07/07/21 1119  ? albuterol (PROVENTIL) (2.5 MG/3ML) 0.083% nebulizer solution 2.5 mg  2.5 mg Nebulization Q4H PRN Clapacs, John T, MD      ? alum & mag hydroxide-simeth (MAALOX/MYLANTA) 200-200-20 MG/5ML suspension 30 mL  30 mL Oral Q4H PRN Clapacs, John T, MD      ? amLODipine (NORVASC) tablet 5 mg  5 mg Oral Daily Parks Ranger, DO   5 mg at 07/07/21 0940  ? aspirin-acetaminophen-caffeine (EXCEDRIN MIGRAINE) per tablet 1-2 tablet  1-2 tablet Oral Q6H PRN Clapacs, Madie Reno, MD      ? cholecalciferol (VITAMIN D3) tablet 2,000 Units  2,000 Units Oral QPC lunch Clapacs, Madie Reno, MD   2,000 Units at 07/06/21 1322  ? fluticasone furoate-vilanterol (BREO ELLIPTA) 200-25 MCG/ACT 1 puff  1 puff Inhalation Daily Benita Gutter, RPH   1 puff at 07/07/21 4332  ? HYDROcodone-acetaminophen (NORCO) 10-325 MG per tablet 1 tablet  1 tablet Oral Q6H Clapacs, Madie Reno, MD   1 tablet at 07/07/21 0940  ? LORazepam (ATIVAN) tablet 1 mg  1 mg Oral QHS PRN Parks Ranger, DO      ? magnesium hydroxide (MILK OF MAGNESIA) suspension 30 mL  30 mL Oral Daily PRN Clapacs, John T, MD      ? mirtazapine (REMERON) tablet 15 mg  15 mg Oral QHS Parks Ranger, DO   15 mg at 07/06/21 2149  ? nicotine (NICODERM CQ - dosed in mg/24 hours) patch 21 mg  21 mg Transdermal Daily Clapacs, Madie Reno, MD   21 mg at 07/07/21 0940  ? OLANZapine (ZYPREXA) tablet 2.5 mg  2.5 mg Oral Q6H PRN  Parks Ranger, DO      ? risperiDONE (RISPERDAL) tablet 0.25 mg  0.25 mg Oral [id number] Parks Ranger, DO   0.25 mg at 07/07/21 0809  ? traZODone (DESYREL) tablet 50 mg  50 mg Oral QHS PRN Clapacs, John T, MD      ? umeclidinium bromide (INCRUSE ELLIPTA) 62.5 MCG/ACT 1 puff  1 puff Inhalation Daily Benita Gutter, RPH   1 puff at 07/07/21 0809  ? ?PTA Medications: ?Medications Prior to Admission  ?Medication Sig Dispense Refill Last Dose  ? acetaminophen (TYLENOL) 325 MG tablet Take 2 tablets (650 mg total) by mouth every 6 (six) hours as needed for mild pain (or Fever >/= 101).     ? albuterol (PROVENTIL) (2.5 MG/3ML) 0.083% nebulizer solution Take 3 mLs (2.5 mg total) by nebulization every 4 (four) hours as needed for wheezing or shortness of breath. 75 mL 12   ? amLODipine (NORVASC) 2.5 MG tablet Take 1 tablet (2.5 mg total) by mouth daily. (Patient taking differently: Take 2.5 mg by mouth every morning.) 90 tablet 3   ? aspirin-acetaminophen-caffeine (EXCEDRIN MIGRAINE) 250-250-65 MG tablet Take 1-2 tablets by mouth every 6 (six) hours as needed for headache.     ? Budeson-Glycopyrrol-Formoterol (BREZTRI AEROSPHERE) 160-9-4.8 MCG/ACT AERO Inhale 2 puffs into the lungs in the morning and at  bedtime. (Patient taking differently: Inhale 2 puffs into the lungs 2 (two) times daily.) 5.9 g 0   ? busPIRone (BUSPAR) 10 MG tablet TAKE 2 TABLETS BY MOUTH 3 TIMES DAILY. (Patient taking differently: Take 20 mg by mouth 3 (three) times daily.) 540 tablet 1   ? Cholecalciferol (VITAMIN D3) 50 MCG (2000 UT) capsule Take 2,000 Units by mouth daily after lunch.     ? DULoxetine (CYMBALTA) 30 MG capsule Take 30 mg by mouth every morning.     ? HYDROcodone-acetaminophen (NORCO) 10-325 MG tablet Take 1 tablet by mouth in the morning, at noon, in the evening, and at bedtime.     ? hydrOXYzine (ATARAX) 10 MG tablet Take 0.5-1 tablets (5-10 mg total) by mouth 3 (three) times daily as needed for anxiety. 30 tablet  0   ? LORazepam (ATIVAN) 0.5 MG tablet Take 0.5 mg by mouth at bedtime as needed for anxiety or sleep.     ? melatonin 5 MG TABS Take 5 mg by mouth at bedtime as needed (sleep).     ? omeprazole (PRILOSEC) 40 MG capsule Take 40 mg by mouth every morning.     ? Pseudoephedrine-Ibuprofen (ADVIL COLD & SINUS LIQUI-GELS PO) Take 1 capsule by mouth daily as needed (pain).     ? ? ?Patient Stressors: Health problems   ? ?Patient Strengths:   ? ?Treatment Modalities: Medication Management, Group therapy, Case management,  ?1 to 1 session with clinician, Psychoeducation, Recreational therapy. ? ? ?Physician Treatment Plan for Primary Diagnosis: Severe recurrent major depression without psychotic features ([place]) ?Long Term Goal(s): Improvement in symptoms so as ready for discharge  ? ?Short Term Goals: Ability to identify changes in lifestyle to reduce recurrence of condition will improve ?Ability to verbalize feelings will improve ?Ability to disclose and discuss suicidal ideas ?Ability to demonstrate self-control will improve ?Ability to identify and develop effective coping behaviors will improve ?Ability to maintain clinical measurements within normal limits will improve ?Compliance with prescribed medications will improve ?Ability to identify triggers associated with substance abuse/mental health issues will improve ? ?Medication Management: Evaluate patient's response, side effects, and tolerance of medication regimen. ? ?Therapeutic Interventions: 1 to 1 sessions, Unit Group sessions and Medication administration. ? ?Evaluation of Outcomes: Progressing ? ?Physician Treatment Plan for Secondary Diagnosis: Principal Problem: ?  Severe recurrent major depression without psychotic features ([place]) ? ?Long Term Goal(s): Improvement in symptoms so as ready for discharge  ? ?Short Term Goals: Ability to identify changes in lifestyle to reduce recurrence of condition will improve ?Ability to verbalize feelings will  improve ?Ability to disclose and discuss suicidal ideas ?Ability to demonstrate self-control will improve ?Ability to identify and develop effective coping behaviors will improve ?Ability to maintain clinical measurements within normal limits will improve ?Compliance with prescribed medications will improve ?Ability to identify triggers associated with substance abuse/mental health issues will improve    ? ?Medication Management: Evaluate patient's response, side effects, and tolerance of medication regimen. ? ?Therapeutic Interventions: 1 to 1 sessions, Unit Group sessions and Medication administration. ? ?Evaluation of Outcomes: Progressing ? ? ?RN Treatment Plan for Primary Diagnosis: Severe recurrent major depression without psychotic features ([place]) ?Long Term Goal(s): Knowledge of disease and therapeutic regimen to maintain health will improve ? ?Short Term Goals: Ability to remain free from injury will improve, Ability to verbalize frustration and anger appropriately will improve, Ability to demonstrate self-control, Ability to participate in decision making will improve, Ability to verbalize feelings will improve, Ability to disclose  and discuss suicidal ideas, Ability to identify and develop effective coping behaviors will improve, and Compliance with prescribed medications will improve ? ?Medication Management: RN will administer medications as ordered by provider, will assess and evaluate patient's response and provide education to patient for prescribed medication. RN will report any adverse and/or side effects to prescribing provider. ? ?Therapeutic Interventions: 1 on 1 counseling sessions, Psychoeducation, Medication administration, Evaluate responses to treatment, Monitor vital signs and CBGs as ordered, Perform/monitor CIWA, COWS, AIMS and Fall Risk screenings as ordered, Perform wound care treatments as ordered. ? ?Evaluation of Outcomes: Progressing ? ? ?LCSW Treatment Plan for Primary Diagnosis:  Severe recurrent major depression without psychotic features ([place]) ?Long Term Goal(s): Safe transition to appropriate next level of care at discharge, Engage patient in therapeutic group addressing interpersonal c

## 2021-07-07 NOTE — [facility] Group Notes (Signed)
[facility] Group Notes:  (Nursing/MHT/Case Management/Adjunct) ? ?Date:  07/07/2021  ?Time:  10:00 AM ? ?Type of Therapy:  Psychoeducational Skills ? ?Participation Level:  Active ? ?Participation Quality:  Appropriate ? ?Affect:  Appropriate ? ?Cognitive:  Appropriate ? ?Insight:  Appropriate ? ?Engagement in Group:  Engaged ? ?Modes of Intervention:  Discussion ? ?Summary of Progress/Problems: ?The pt attended group and was actively engaged and had many contributions to discussion. ?Juliette Alcide ?07/07/2021, 11:02 AM ?

## 2021-07-07 NOTE — [facility] Suicide Risk Assessment (Signed)
[facility] INPATIENT:  Family/Significant Other Suicide Prevention Education ? ?Suicide Prevention Education:  ?Education Completed; [name], (name of family member/significant other) has been identified by the patient as the family member/significant other with whom the patient will be residing, and identified as the person(s) who will aid the patient in the event of a mental health crisis (suicidal ideations/suicide attempt).  With written consent from the patient, the family member/significant other has been provided the following suicide prevention education, prior to the and/or following the discharge of the patient. ? ?The suicide prevention education provided includes the following: ?Suicide risk factors ?Suicide prevention and interventions ?National Suicide Hotline telephone number ?[facility] assessment telephone number ?[facility] Emergency Assistance 911 ?[place] and/or Residential Mobile Crisis Unit telephone number ? ?Request made of family/significant other to: ?Remove weapons (e.g., guns, rifles, knives), all items previously/currently identified as safety concern.   ?Remove drugs/medications (over-the-counter, prescriptions, illicit drugs), all items previously/currently identified as a safety concern. ? ?The family member/significant other verbalizes understanding of the suicide prevention education information provided.  The family member/significant other agrees to remove the items of safety concern listed above. ? ?Andrus Sharp A [place] ?07/07/2021, 4:05 PM ?

## 2021-07-07 NOTE — Progress Notes (Addendum)
Pt standing at nurses station, calm, cooperative. She states "I'm pretty sore." Pt c/o "sharp" pain that feels like a "jab" at the top of her stomach that goes "straight through" her back; she rates pain 5/10 and states that pain is worse when she breathes. She denies SI/HI/AVH, anxiety and depression at this time. She describes her sleep as "pretty good" and her appetite as "good". Mild distress d/t pain noted. PRN pain medication administered. ?

## 2021-07-07 NOTE — Progress Notes (Signed)
Physical Therapy Treatment ?Patient Details ?Name: Tina Bailey ?MRN: [id number] ?DOB: [birth year] ?Today's Date: 07/07/2021 ? ? ?History of Present Illness Pt is a 76 y.o. female presenting to hospital 4/16 with chief complaint of suicidal intent and depression.  Seen in ED 2 days prior with presumed unintentional overdose (pt later reported it was a suicide attempt).  PMH includes chronic pain, fibromyalgia, O2 dependent, CHF, skin picking habit, TAA without rupture, COPD, and R IMN 2019 ? ?  ?PT Comments  ? ? Pt was pleasant and motivated to participate during the session and put forth good effort throughout. Pt required no physical assistance with functional tasks.  Pt steady with transfers and gait and put forth good effort with dynamic standing balance training and core therex.  Pt limited by pain during attempt at TA therex in hooklying and needed to perform therex sidelying.  Pt will benefit from OPPT upon discharge to safely address deficits listed in patient problem list for decreased caregiver assistance and eventual return to PLOF. ? ?   ?Recommendations for follow up therapy are one component of a multi-disciplinary discharge planning process, led by the attending physician.  Recommendations may be updated based on patient status, additional functional criteria and insurance authorization. ? ?Follow Up Recommendations ? Outpatient PT ?  ?  ?Assistance Recommended at Discharge PRN  ?Patient can return home with the following Assistance with cooking/housework;Assist for transportation ?  ?Equipment Recommendations ? None recommended by PT  ?  ?Recommendations for Other Services   ? ? ?  ?Precautions / Restrictions Precautions ?Precautions: Fall ?Restrictions ?Weight Bearing Restrictions: No  ?  ? ?Mobility ? Bed Mobility ?Overal bed mobility: Needs Assistance ?Bed Mobility: Rolling, Sidelying to Sit, Sit to Sidelying ?Rolling: Supervision ?Sidelying to sit: Supervision ?  ?  ?Sit to sidelying:  Supervision ?General bed mobility comments: Min cuing for log roll technique ?  ? ?Transfers ?Overall transfer level: Modified independent ?  ?  ?  ?  ?  ?  ?  ?  ?General transfer comment: Good control and stability ?  ? ?Ambulation/Gait ?Ambulation/Gait assistance: Modified independent (Device/Increase time) ?Gait Distance (Feet): 30 Feet ?Assistive device: Rollator (4 wheels) ?Gait Pattern/deviations: Step-through pattern, Decreased step length - left, Decreased step length - right ?Gait velocity: decreased ?  ?  ?General Gait Details: slow cadence with good stabiltiy throughout ? ? ?Stairs ?  ?  ?  ?  ?  ? ? ?Wheelchair Mobility ?  ? ?Modified Rankin (Stroke Patients Only) ?  ? ? ?  ?Balance Overall balance assessment: Needs assistance ?Sitting-balance support: No upper extremity supported, Feet supported ?Sitting balance-Leahy Scale: Normal ?  ?  ?Standing balance support: No upper extremity supported ?Standing balance-Leahy Scale: Good ?Standing balance comment: Steady with dynamic standing tasks including reaching minimally outside BOS ?  ?  ?  ?  ?  ?  ?  ?  ?  ?  ?  ?  ? ?  ?Cognition Arousal/Alertness: Awake/alert ?Behavior During Therapy: Anxious ?Overall Cognitive Status: Within Functional Limits for tasks assessed ?  ?  ?  ?  ?  ?  ?  ?  ?  ?  ?  ?  ?  ?  ?  ?  ?  ?  ?  ? ?  ?Exercises Other Exercises ?Other Exercises: Dynamic standing balance training with reaching outside BOS ?Other Exercises: Log roll training/review ?Other Exercises: Gentle core therex in sidelying with TA contractions ? ?  ?General Comments   ?  ?  ? ?  Pertinent Vitals/Pain Pain Assessment ?Pain Assessment: 0-10 ?Pain Score: 6  ?Pain Location: xiphoid process and just distal to xiphoid process and mid thoracic spine (pt reports pain pierces through straight to her back) ?Pain Descriptors / Indicators: Tender, Constant ?Pain Intervention(s): Repositioned, Monitored during session, Premedicated before session  ? ? ?Home Living  Family/patient expects to be discharged to:: Other (Comment) ?Living Arrangements: Other (Comment) ([place]) ?  ?  ?  ?  ?  ?  ?  ?  ?Additional Comments: [place] (pt reports it is a Information systems manager access)  ?  ?Prior Function    ?  ?  ?   ? ?PT Goals (current goals can now be found in the care plan section) Progress towards PT goals: Progressing toward goals ? ?  ?Frequency ? ? ? Min 2X/week ? ? ? ?  ?PT Plan Current plan remains appropriate  ? ? ?Co-evaluation   ?  ?  ?  ?  ? ?  ?AM-PAC PT "6 Clicks" Mobility   ?Outcome Measure ? Help needed turning from your back to your side while in a flat bed without using bedrails?: None ?Help needed moving from lying on your back to sitting on the side of a flat bed without using bedrails?: None ?Help needed moving to and from a bed to a chair (including a wheelchair)?: None ?Help needed standing up from a chair using your arms (e.g., wheelchair or bedside chair)?: None ?Help needed to walk in hospital room?: None ?Help needed climbing 3-5 steps with a railing? : None ?6 Click Score: 24 ? ?  ?End of Session   ?Activity Tolerance: Patient limited by pain ?Patient left: in bed ?Nurse Communication: Mobility status ?PT Visit Diagnosis: Pain;Muscle weakness (generalized) (M62.81) ?Pain - part of body:  (inferior sternum and mid thoracic spine) ?  ? ? ?Time: 9379-0240 ?PT Time Calculation (min) (ACUTE ONLY): 26 min ? ?Charges:  $Therapeutic Exercise: 8-22 mins ?$Therapeutic Activity: 8-22 mins          ?          ?D. Royetta Asal PT, DPT ?07/07/21, 4:57 PM ? ? ? ?

## 2021-07-07 NOTE — Progress Notes (Signed)
Patient is alert and oriented x4. Patient states that she was awake all night, didn't get much sleep. Patient denies SI, HI, AVH, depression and anxiety. Patient rated pain a 10/10. Scheduled pain medication administered and patient verbalized some relief. Patient's affect is sad and sullen. Speech is clear and thoughts are organized. Patient ate breakfast and is currently eating lunch in the day room. Medications administered as ordered. Patient requested a PRN tylenol for a pain of 7/10. Patient participated in group this morning. Patient was also seen by OT. Emotional support provided. Patient remains safe on the unit with Q 15 minutes safety checks per unit protocol. ?

## 2021-07-08 DIAGNOSIS — F332 Major depressive disorder, recurrent severe without psychotic features: Secondary | ICD-10-CM | POA: Diagnosis not present

## 2021-07-08 MED ORDER — GABAPENTIN 100 MG PO CAPS
200.0000 mg | ORAL_CAPSULE | Freq: Three times a day (TID) | ORAL | Status: DC
  Administered 2021-07-08 – 2021-07-10 (×6): 200 mg via ORAL
  Filled 2021-07-08 (×6): qty 2

## 2021-07-08 NOTE — Progress Notes (Signed)
Recreation Therapy Notes ? ?INPATIENT RECREATION THERAPY ASSESSMENT ? ?Patient Details ?Name: KAYAH HECKER ?MRN: [id number] ?DOB: [birth year] ?Today's Date: 07/08/2021 ?      ?Information Obtained From: ?Patient ? ?Able to Participate in Assessment/Interview: ?Yes ? ?Patient Presentation: ?Responsive ? ?Reason for Admission (Per Patient): ?Active Symptoms ? ?Patient Stressors: ?Relationship ? ?Coping Skills:   ?Read ? ?Leisure Interests (2+):  ?Exercise - Walking, Music - Listen, Individual - Reading ([place]) ? ?Frequency of Recreation/Participation: ?Monthly ? ?Awareness of Community Resources:  ?No ? ?Community Resources:  ?  ? ?Current Use: ?  ? ?If no, Barriers?: ?  ? ?Expressed Interest in [employer] Information: ?No ? ?[place] of Residence:  ?[place] ? ?Patient Main Form of Transportation: ?Car ? ?Patient Strengths:  ?Music ? ?Patient Identified Areas of Improvement:  ?Time with my husband ? ?Patient Goal for Hospitalization:  ?Being happy ? ?Current SI (including self-harm):  ?No ? ?Current HI:  ?No ? ?Current AVH: ?No ? ?Staff Intervention Plan: ?Group Attendance, Collaborate with Interdisciplinary Treatment Team ? ?Consent to Intern Participation: ?N/A ? ?Azriel Dancy ?07/08/2021, 4:28 PM ?

## 2021-07-08 NOTE — Progress Notes (Signed)
Recreation Therapy Notes ? ?Date: 07/08/2021  ?  ?Time: 1:30pm  ?  ?Location: [place]  ?  ?Behavioral response: Appropriate ?  ?Intervention Topic:  Social Skills  ?  ?Discussion/Intervention:  ?Group content on today was focused on social skills. The group defined social skills and identified ways they use social skills. Patients expressed what obstacles they face when trying to be social. Participants described the importance of social skills. The group listed ways to improve social skills and reasons to improve social skills. Individuals had an opportunity to learn new and improve social skills as well as identify their weaknesses. ?  ?Clinical Observations/Feedback: ?Patient came to group and expressed past experiences with socializing. Individual was social with peers and staff while participating in the intervention.   ?Tina Bailey LRT/CTRS  ?  ? ? ? ? ? ? ? ?Tina Bailey ?07/08/2021 4:09 PM ?

## 2021-07-08 NOTE — Progress Notes (Signed)
Recreation Therapy Notes ? ?INPATIENT RECREATION TR PLAN ? ?Patient Details ?Name: Tina Bailey ?MRN: [id number] ?DOB: Jul 21, 1945 ?Today's Date: 07/08/2021 ? ?Rec Therapy Plan ?Is patient appropriate for Therapeutic Recreation?: Yes ?Treatment times per week: at least 3 ?Estimated Length of Stay: 5-7 days ?TR Treatment/Interventions: Group participation (Comment) ? ?Discharge Criteria ?Pt will be discharged from therapy if:: Discharged ?Treatment plan/goals/alternatives discussed and agreed upon by:: Patient/family ? ?Discharge Summary ?  ? ? ?Jamarie Mussa ?07/08/2021, 4:29 PM ?

## 2021-07-08 NOTE — Progress Notes (Signed)
Pt sitting in day room; calm, cooperative. Pt states "I'm tired tonight." She c/o a "jabbing" pain at the top of her stomach that goes "through" her back, which she rates 3/10. She denies SI/HI/AVH at this time. She reports that she did not sleep good last night. She describes her appetite as "good". She currently denies anxiety; however, she expresses feelings of depression, which she rates 4/10 due to "what's going to happen when I leave here; especially my pain. No acute distress noted. ?

## 2021-07-08 NOTE — Progress Notes (Signed)
Occupational Therapy Treatment ?Patient Details ?Name: Tina Bailey ?MRN: [id number] ?DOB: [birth year] ?Today's Date: 07/08/2021 ? ? ?History of present illness Pt is a 76 y.o. female presenting to hospital 4/16 with chief complaint of suicidal intent and depression.  Seen in ED 2 days prior with presumed unintentional overdose (pt later reported it was a suicide attempt).  PMH includes chronic pain, fibromyalgia, O2 dependent, CHF, skin picking habit, TAA without rupture, COPD, and R IMN 2019 ?  ?OT comments ? Pt located in group treatment room and was agreeable to OT intervention. Pt agreeable to planned shower with continued education of proper body mechanics, safety, and use of back precautions for comfort. Pt obtains all needed items for shower and transfers into walk in shower and onto TTB without assistance. Pt bathing self with use of figure four position while seated. She obtains towel and dries self before exiting the bathroom to sit onto bed to dress. Pt showing good safety awareness. She demonstrates ability to pick up items from floor with good body mechanics and reports feeling much better today. Pt ambulates with use of RW at mod I level back to group therapy room at end of session. OT to SIGN OFF secondary to pt meeting goals at this time.   ? ?Recommendations for follow up therapy are one component of a multi-disciplinary discharge planning process, led by the attending physician.  Recommendations may be updated based on patient status, additional functional criteria and insurance authorization. ?   ?Follow Up Recommendations ? No OT follow up  ?  ?Assistance Recommended at Discharge PRN  ?   ?Equipment Recommendations ? None recommended by OT  ?  ?   ?Precautions / Restrictions Precautions ?Precautions: Fall  ? ? ?  ? ?Mobility Bed Mobility ?Overal bed mobility: Modified Independent ?  ?  ?  ?  ?  ?  ?  ?  ? ?Transfers ?Overall transfer level: Modified independent ?Equipment used: Rollator (4  wheels) ?  ?  ?  ?  ?  ?  ?  ?General transfer comment: Good control and stability ?  ?  ?Balance Overall balance assessment: Needs assistance ?Sitting-balance support: No upper extremity supported, Feet supported ?Sitting balance-Leahy Scale: Normal ?Sitting balance - Comments: steady sitting reaching outside BOS ?  ?Standing balance support: No upper extremity supported ?Standing balance-Leahy Scale: Good ?  ?  ?  ?  ?  ?  ?  ?  ?  ?  ?  ?  ?   ? ?ADL either performed or assessed with clinical judgement  ? ?ADL Overall ADL's : Modified independent ?  ?  ?  ?  ?  ?  ?  ?  ?  ?  ?  ?  ?  ?  ?  ?  ?  ?  ?  ?General ADL Comments: mod I for shower transfer, bathing, and dressing ?  ? ?Extremity/Trunk Assessment Upper Extremity Assessment ?Upper Extremity Assessment: Generalized weakness;Overall [facility] for tasks assessed ?  ?Lower Extremity Assessment ?Lower Extremity Assessment: Generalized weakness;Overall [facility] for tasks assessed ?  ?Cervical / Trunk Assessment ?Cervical / Trunk Assessment: Kyphotic ?  ? ?Vision Patient Visual Report: No change from baseline ?  ?  ?   ?   ? ?Cognition Arousal/Alertness: Awake/alert ?Behavior During Therapy: Anxious ?Overall Cognitive Status: Within Functional Limits for tasks assessed ?  ?  ?  ?  ?  ?  ?  ?  ?  ?  ?  ?  ?  ?  ?  ?  ?  General Comments: Verbose and tangential but very pleasant ?  ?  ?   ?   ?   ?   ? ? ?Pertinent Vitals/ Pain       Pain Assessment ?Pain Assessment: 0-10 ?Pain Score: 2  ?Pain Descriptors / Indicators: Tender, Constant ?Pain Intervention(s): Limited activity within patient's tolerance, Premedicated before session, Repositioned ? ?   ?   ? ?Frequency ? Min 2X/week  ? ? ? ? ?  ?Progress Toward Goals ? ?OT Goals(current goals can now be found in the care plan section) ? Progress towards OT goals: Progressing toward goals ? ?Acute Rehab OT Goals ?Patient Stated Goal: to go home ?OT Goal Formulation: With patient ?Time For Goal Achievement: 07/21/21 ?Potential to  Achieve Goals: Fair  ?Plan All goals met and education completed, patient discharged from OT services   ? ?   ?AM-PAC OT "6 Clicks" Daily Activity     ?Outcome Measure ? ? Help from another person eating meals?: None ?Help from another person taking care of personal grooming?: None ?Help from another person toileting, which includes using toliet, bedpan, or urinal?: None ?Help from another person bathing (including washing, rinsing, drying)?: None ?Help from another person to put on and taking off regular upper body clothing?: None ?Help from another person to put on and taking off regular lower body clothing?: None ?6 Click Score: 24 ? ?  ?End of Session Equipment Utilized During Treatment: Rollator (4 wheels) ? ?OT Visit Diagnosis: Unsteadiness on feet (R26.81);Muscle weakness (generalized) (M62.81);Pain ?  ?Activity Tolerance Patient tolerated treatment well ?  ?Patient Left Other (comment) (Pt returns to group treatment) ?  ?Nurse Communication Mobility status ?  ? ?   ? ?Time: 3568-6168 ?OT Time Calculation (min): 41 min ? ?Charges: OT General Charges ?$OT Visit: 1 Visit ?OT Treatments ?$Self Care/Home Management : 38-52 mins ? ?Darleen Crocker, [place], OTR/L , CBIS ?ascom [phone]  ?07/08/21, 2:27 PM  ?

## 2021-07-08 NOTE — Progress Notes (Signed)
Patient has been cooperative with treatment, she remains sad, but she denies SI, HI & AVH.She was compliant with medication, she denies needing the nicotine patch, she reports she has not smoked in over two years.  She has been visible in the milieu and she interacted well with staff and peers. No new behavioral issues to report on shift at this time.  ?

## 2021-07-08 NOTE — Group Note (Signed)
[place] LCSW Group Therapy Note ? ? ?Group Date: 07/08/2021 ?Start Time: 1155 ?End Time: 2080 ? ? ?Type of Therapy/Topic:  Group Therapy:  Balance in Life ? ?Participation Level:  Active  ? ?Description of Group:   ? This group will address the concept of balance and how it feels and looks when one is unbalanced. Patients will be encouraged to process areas in their lives that are out of balance, and identify reasons for remaining unbalanced. Facilitators will guide patients utilizing problem- solving interventions to address and correct the stressor making their life unbalanced. Understanding and applying boundaries will be explored and addressed for obtaining  and maintaining a balanced life. Patients will be encouraged to explore ways to assertively make their unbalanced needs known to significant others in their lives, using other group members and facilitator for support and feedback. ? ?Therapeutic Goals: ?Patient will identify two or more emotions or situations they have that consume much of in their lives. ?Patient will identify signs/triggers that life has become out of balance:  ?Patient will identify two ways to set boundaries in order to achieve balance in their lives:  ?Patient will demonstrate ability to communicate their needs through discussion and/or role plays ? ?Summary of Patient Progress: ? ? ? ?Patient was present for the entirety of the group session. Patient was an active listener and participated in the topic of discussion. CSW met with patients in the courtyard to facilitate discussion. Patient stated that she is feeling better and had a pleasant mood. She said her physical therapy has really been helpful and says that is something that she can do to continue to improve her overall wellbeing.   ? ? ? ? ?Therapeutic Modalities:   ?Cognitive Behavioral Therapy ?Solution-Focused Therapy ?Assertiveness Training ? ? ?Arpi Diebold A [place], LCSWA ?

## 2021-07-08 NOTE — Progress Notes (Signed)
Physical Therapy Treatment ?Patient Details ?Name: Tina Bailey ?MRN: [id number] ?DOB: 03/13/1946 ?Today's Date: 07/08/2021 ? ? ?History of Present Illness Pt is a 76 y.o. female presenting to hospital 4/16 with chief complaint of suicidal intent and depression.  Seen in ED 2 days prior with presumed unintentional overdose (pt later reported it was a suicide attempt).  PMH includes chronic pain, fibromyalgia, O2 dependent, CHF, skin picking habit, TAA without rupture, COPD, and R IMN 2019 ? ?  ?PT Comments  ? ? Pt resting in bed upon PT arrival; agreeable to PT session.  Reviewed transverse abdominal (TA) isometric contractions in L sidelying (pt able to perform x10 reps with 5 seconds holds each) with minimal vc's.  Then trialed short ambulation in room (using rollator) with focus on breathing and gentle TA contraction to improve core strength and pain control (requires review).  Will continue to focus on core strengthening (to improve pain control). ?  ?Recommendations for follow up therapy are one component of a multi-disciplinary discharge planning process, led by the attending physician.  Recommendations may be updated based on patient status, additional functional criteria and insurance authorization. ? ?Follow Up Recommendations ? Outpatient PT ?  ?  ?Assistance Recommended at Discharge PRN  ?Patient can return home with the following Assistance with cooking/housework;Assist for transportation ?  ?Equipment Recommendations ? None recommended by PT  ?  ?Recommendations for Other Services OT consult ? ? ?  ?Precautions / Restrictions Precautions ?Precautions: Fall ?Precaution Comments: Uses O2 for longer ambulation distances ?Restrictions ?Weight Bearing Restrictions: No  ?  ? ?Mobility ? Bed Mobility ?Overal bed mobility: Modified Independent ?Bed Mobility: Sidelying to Sit, Sit to Sidelying ?Rolling: Modified independent (Device/Increase time) ?Sidelying to sit: Modified independent (Device/Increase  time) ?  ?  ?Sit to sidelying: Modified independent (Device/Increase time) ?General bed mobility comments: good logrolling technique noted ?  ? ?Transfers ?Overall transfer level: Modified independent ?Equipment used: Rollator (4 wheels) ?  ?  ?  ?  ?  ?  ?  ?General transfer comment: steady safe transfers ?  ? ?Ambulation/Gait ?Ambulation/Gait assistance: Modified independent (Device/Increase time) ?Gait Distance (Feet): 30 Feet ?Assistive device: Rollator (4 wheels) ?  ?Gait velocity: decreased ?  ?  ?General Gait Details: steady step through gait pattern with focus on breathing and gentle TA/core activation ? ? ?Stairs ?  ?  ?  ?  ?  ? ? ?Wheelchair Mobility ?  ? ?Modified Rankin (Stroke Patients Only) ?  ? ? ?  ?Balance Overall balance assessment: Needs assistance ?Sitting-balance support: No upper extremity supported, Feet supported ?Sitting balance-Leahy Scale: Normal ?Sitting balance - Comments: steady sitting reaching outside BOS ?  ?Standing balance support: No upper extremity supported ?Standing balance-Leahy Scale: Good ?Standing balance comment: steady standing reaching within BOS ?  ?  ?  ?  ?  ?  ?  ?  ?  ?  ?  ?  ? ?  ?Cognition Arousal/Alertness: Awake/alert ?Behavior During Therapy: Anxious ?Overall Cognitive Status: Within Functional Limits for tasks assessed ?  ?  ?  ?  ?  ?  ?  ?  ?  ?  ?  ?  ?  ?  ?  ?  ?General Comments: Verbose and tangential but very pleasant ?  ?  ? ?  ?Exercises   ? ?  ?General Comments   ?  ?  ? ?Pertinent Vitals/Pain Pain Assessment ?Pain Assessment: 0-10 ?Pain Score: 7  ?Pain Location: xiphoid process and just distal  to xiphoid process and mid thoracic spine (pt reports pain pierces through straight to her back) ?Pain Descriptors / Indicators: Aching, Shooting ?Pain Intervention(s): Limited activity within patient's tolerance, Monitored during session, Repositioned (pt reports pain not too bad  (didn't need pain meds yet))  ? ? ?Home Living   ?  ?  ?  ?  ?  ?  ?  ?  ?  ?    ?  ?Prior Function    ?  ?  ?   ? ?PT Goals (current goals can now be found in the care plan section) Acute Rehab PT Goals ?Patient Stated Goal: to improve pain ?PT Goal Formulation: With patient ?Time For Goal Achievement: 07/20/21 ?Potential to Achieve Goals: Fair ?Progress towards PT goals: Progressing toward goals ? ?  ?Frequency ? ? ? Min 2X/week ? ? ? ?  ?PT Plan Current plan remains appropriate  ? ? ?Co-evaluation   ?  ?  ?  ?  ? ?  ?AM-PAC PT "6 Clicks" Mobility   ?Outcome Measure ? Help needed turning from your back to your side while in a flat bed without using bedrails?: None ?Help needed moving from lying on your back to sitting on the side of a flat bed without using bedrails?: None ?Help needed moving to and from a bed to a chair (including a wheelchair)?: None ?Help needed standing up from a chair using your arms (e.g., wheelchair or bedside chair)?: None ?Help needed to walk in hospital room?: None ?Help needed climbing 3-5 steps with a railing? : None ?6 Click Score: 24 ? ?  ?End of Session Equipment Utilized During Treatment: Gait belt ?Activity Tolerance: Patient tolerated treatment well ?Patient left: in bed ?  ?PT Visit Diagnosis: Pain;Muscle weakness (generalized) (M62.81) ?  ? ? ?Time: 7035-0093 ?PT Time Calculation (min) (ACUTE ONLY): 23 min ? ?Charges:  $Therapeutic Exercise: 23-37 mins          ?          ? ?Leitha Bleak, PT ?07/08/21, 5:11 PM ? ? ?

## 2021-07-08 NOTE — Progress Notes (Signed)
[facility] MD Progress Note ? ?07/08/2021 10:54 AM ?Tina Bailey  ?MRN:  [id number] ?Subjective: Tina Bailey states that she feels a little better.  She thinks that the Neurontin was helpful for her pain and her mood.  We talked about increasing it and she was good with that.  She states that she slept okay.  She is interacting with staff and peers appropriately.  She denies any side effects from her medication.  She seems to be improving. ? ?Principal Problem: Severe recurrent major depression without psychotic features ([place]) ?Diagnosis: Principal Problem: ?  Severe recurrent major depression without psychotic features ([place]) ? ?Total Time spent with patient: 15 minutes ? ?Past Psychiatric History: ?None.  His thought process is ? ?Past Medical History:  ?Past Medical History:  ?Diagnosis Date  ? Arthritis   ? DDD. Right shoulder"is frozen"-limited ROM. osteoporosis.  ? Blue toe syndrome ([place]) 11/27/2014  ? Dr. Claudia Pollock evaluating  ? CHF (congestive heart failure) ([place])   ? Complication of anesthesia   ? COPD (chronic obstructive pulmonary disease) ([place])   ? Fibromyalgia   ? Fracture of rib of right side   ? hx "osteoporosis"- states her dog nudge her on the side, next day developed great pain and was told has a fracture rib.  ? Liver cancer ([place]) 12/2014  ? Macular degeneration   ? R eye  ? Osteoporosis   ? PONV (postoperative nausea and vomiting)   ? nausea,severe vomiting after 01-13-15 portal vein embolization  ? Productive cough   ? Retina disorder   ? L eye, vision distorted, edema  ? Spine fracture due to birth trauma   ? TMJ disease   ? Wears glasses   ?  ?Past Surgical History:  ?Procedure Laterality Date  ? ANGIOPLASTY  2008  ? no stents required, no follow-up with cardiologist, no recurrent chest pain  ? CATARACT EXTRACTION, BILATERAL Bilateral   ? ESOPHAGOGASTRODUODENOSCOPY (EGD) WITH PROPOFOL N/A 06/25/2015  ? Procedure: ESOPHAGOGASTRODUODENOSCOPY (EGD) WITH PROPOFOL;  Surgeon: Milus Banister, MD;  Location: [facility]  ENDOSCOPY;  Service: Endoscopy;  Laterality: N/A;  stent removal   ? EYE SURGERY    ? cornea surgery  ? INTRAMEDULLARY (IM) NAIL INTERTROCHANTERIC Right 12/04/2017  ? Procedure: INTRAMEDULLARY (IM) NAIL INTERTROCHANTRIC;  Surgeon: Rod Can, MD;  Location: [facility] ORS;  Service: Orthopedics;  Laterality: Right;  ? IR RADIOLOGIST EVAL & MGMT  03/29/2017  ? KIDNEY STONE SURGERY    ? LAPAROSCOPIC PARTIAL HEPATECTOMY N/A 03/04/2015  ? Procedure: DIAGNOSTIC LAPAROSCOPY, EXTENDED RIGHT HEPATECTOMY, WITH INTRAOPERATIVE ULTRASOUND;  Surgeon: Stark Klein, MD;  Location: [facility] ORS;  Service: General;  Laterality: N/A;  ? PARTIAL HYSTERECTOMY    ? portal vein embolization    ? 01-13-15 -Dr. Cathlean Sauer.  ? ?Family History:  ?Family History  ?Problem Relation Age of Onset  ? Hypertension Mother   ? Atrial fibrillation Mother   ? Heart disease Mother   ?     after age 44  ? Breast cancer Mother   ? Stroke Father   ? Dementia Father   ? Peptic Ulcer Father   ? Heart disease Brother   ?     After age 28- A-Fib  ? Heart attack Brother   ? ? ?Social History:  ?Social History  ? ?Substance and Sexual Activity  ?Alcohol Use No  ? Alcohol/week: 0.0 standard drinks  ?   ?Social History  ? ?Substance and Sexual Activity  ?Drug Use No  ?  ?Social History  ? ?  Socioeconomic History  ? Marital status: Divorced  ?  Spouse name: Not on file  ? Number of children: 3  ? Years of education: Not on file  ? Highest education level: Not on file  ?Occupational History  ? Occupation: retired  ?Tobacco Use  ? Smoking status: Former  ?  Packs/day: 1.00  ?  Years: 45.00  ?  Pack years: 45.00  ?  Types: Cigarettes  ?  Quit date: 02/20/2018  ?  Years since quitting: 3.3  ? Smokeless tobacco: Never  ?Vaping Use  ? Vaping Use: Never used  ?Substance and Sexual Activity  ? Alcohol use: No  ?  Alcohol/week: 0.0 standard drinks  ? Drug use: No  ? Sexual activity: Not Currently  ?Other Topics Concern  ? Not on file  ?Social History Narrative  ? Not on file   ? ?Social Determinants of Health  ? ?Financial Resource Strain: Not on file  ?Food Insecurity: Not on file  ?Transportation Needs: Not on file  ?Physical Activity: Not on file  ?Stress: Not on file  ?Social Connections: Not on file  ? ?Additional Social History:  ?  ?  ?  ?  ?  ?  ?  ?  ?  ?  ?  ? ?Sleep: Good ? ?Appetite:  Good ? ?Current Medications: ?Current Facility-Administered Medications  ?Medication Dose Route Frequency Provider Last Rate Last Admin  ? acetaminophen (TYLENOL) tablet 650 mg  650 mg Oral Q6H PRN Clapacs, Madie Reno, MD   650 mg at 07/08/21 0846  ? albuterol (PROVENTIL) (2.5 MG/3ML) 0.083% nebulizer solution 2.5 mg  2.5 mg Nebulization Q4H PRN Clapacs, John T, MD      ? alum & mag hydroxide-simeth (MAALOX/MYLANTA) 200-200-20 MG/5ML suspension 30 mL  30 mL Oral Q4H PRN Clapacs, John T, MD      ? amLODipine (NORVASC) tablet 5 mg  5 mg Oral Daily Parks Ranger, DO   5 mg at 07/08/21 1194  ? aspirin-acetaminophen-caffeine (EXCEDRIN MIGRAINE) per tablet 1-2 tablet  1-2 tablet Oral Q6H PRN Clapacs, Madie Reno, MD      ? cholecalciferol (VITAMIN D3) tablet 2,000 Units  2,000 Units Oral QPC lunch Clapacs, Madie Reno, MD   2,000 Units at 07/07/21 1248  ? fluticasone furoate-vilanterol (BREO ELLIPTA) 200-25 MCG/ACT 1 puff  1 puff Inhalation Daily Benita Gutter, RPH   1 puff at 07/08/21 0841  ? gabapentin (NEURONTIN) capsule 200 mg  200 mg Oral TID Parks Ranger, DO      ? HYDROcodone-acetaminophen [facility]) 10-325 MG per tablet 1 tablet  1 tablet Oral Q6H Clapacs, Madie Reno, MD   1 tablet at 07/08/21 [phone]  ? LORazepam (ATIVAN) tablet 1 mg  1 mg Oral QHS PRN Parks Ranger, DO      ? magnesium hydroxide (MILK OF MAGNESIA) suspension 30 mL  30 mL Oral Daily PRN Clapacs, John T, MD      ? mirtazapine (REMERON) tablet 15 mg  15 mg Oral QHS Parks Ranger, DO   15 mg at 07/07/21 2135  ? nicotine (NICODERM CQ - dosed in mg/24 hours) patch 21 mg  21 mg Transdermal Daily Clapacs, Madie Reno,  MD   21 mg at 07/07/21 0940  ? OLANZapine (ZYPREXA) tablet 2.5 mg  2.5 mg Oral Q6H PRN Parks Ranger, DO      ? risperiDONE (RISPERDAL) tablet 0.25 mg  0.25 mg Oral [id number] Parks Ranger, DO   0.25 mg at 07/08/21  0841  ? traZODone (DESYREL) tablet 50 mg  50 mg Oral QHS PRN Clapacs, John T, MD      ? umeclidinium bromide (INCRUSE ELLIPTA) 62.5 MCG/ACT 1 puff  1 puff Inhalation Daily Benita Gutter, RPH   1 puff at 07/08/21 5462  ? ? ?Lab Results: No results found for this or any previous visit (from the past 48 hour(s)). ? ?Blood Alcohol level:  ?Lab Results  ?Component Value Date  ? ETH <10 07/04/2021  ? ? ?Metabolic Disorder Labs: ?Lab Results  ?Component Value Date  ? HGBA1C 6.2 05/24/2021  ? ?No results found for: PROLACTIN ?Lab Results  ?Component Value Date  ? CHOL 190 05/24/2021  ? TRIG 65.0 05/24/2021  ? HDL 90.80 05/24/2021  ? CHOLHDL 2 05/24/2021  ? VLDL 13.0 05/24/2021  ? [place] 86 05/24/2021  ? [place] 82 01/09/2017  ? ? ?Physical Findings: ?AIMS:  , ,  ,  ,    ?CIWA:    ?COWS:    ? ?Musculoskeletal: ?Strength & Muscle Tone: decreased ?Gait & Station: unsteady ?Patient leans: N/A ? ?Psychiatric Specialty Exam: ? ?Presentation  ?General Appearance: No data recorded ?Eye Contact:No data recorded ?Speech:No data recorded ?Speech Volume:No data recorded ?Handedness:No data recorded ? ?Mood and Affect  ?Mood:No data recorded ?Affect:No data recorded ? ?Thought Process  ?Thought Processes:No data recorded ?Descriptions of Associations:No data recorded ?Orientation:No data recorded ?Thought Content:No data recorded ?History of Schizophrenia/Schizoaffective disorder:No ? ?Duration of Psychotic Symptoms:No data recorded ?Hallucinations:No data recorded ?Ideas of Reference:No data recorded ?Suicidal Thoughts:No data recorded ?Homicidal Thoughts:No data recorded ? ?Sensorium  ?Memory:No data recorded ?Judgment:No data recorded ?Insight:No data recorded ? ?Executive Functions   ?Concentration:No data recorded ?Attention Span:No data recorded ?Recall:No data recorded ?Fund of [place] recorded ?Language:No data recorded ? ?Psychomotor Activity  ?Psychomotor Activity:No data recorded ?

## 2021-07-09 DIAGNOSIS — F332 Major depressive disorder, recurrent severe without psychotic features: Secondary | ICD-10-CM | POA: Diagnosis not present

## 2021-07-09 NOTE — Progress Notes (Signed)
Occupational Therapy Treatment ?Patient Details ?Name: Tina Bailey ?MRN: [id number] ?DOB: Dec 10, 1945 ?Today's Date: 07/09/2021 ? ? ?History of present illness Pt is a 76 y.o. female presenting to hospital 4/16 with chief complaint of suicidal intent and depression.  Seen in ED 2 days prior with presumed unintentional overdose (pt later reported it was a suicide attempt).  PMH includes chronic pain, fibromyalgia, O2 dependent, CHF, skin picking habit, TAA without rupture, COPD, and R IMN 2019 ?  ?OT comments ? Pt is received by the nurses' station, where she reports she has been waiting, counting the time until she can have her next dose of pain medication; however, she readily agrees to participate in therapy. Ambulates to room easily using rollator, no LOB or hesitation in walking. Discussed pt's life and health background, brainstormed with pt re: activities and strategies she could use to address pain, anxiety, and loneliness. Pt reports she used to play guitar regularly and it gave her great pleasure, but that she has not played since she had major abdominal surgery in 2016. Introduced gentle stretching + breathing exercises in sitting and standing, to relieve pain, promote relaxation, and strengthen core muscles. Provided pt "homework" assignment focusing on cognitive reframing and increasing self-efficacy.   ? ?Recommendations for follow up therapy are one component of a multi-disciplinary discharge planning process, led by the attending physician.  Recommendations may be updated based on patient status, additional functional criteria and insurance authorization. ?   ?Follow Up Recommendations ? Other (comment) (ongoing outpatient psych therapy or mental health OT re: depression, anxiety)  ?  ?Assistance Recommended at Discharge    ?Patient can return home with the following ?   ?  ?Equipment Recommendations ? None recommended by OT  ?  ?Recommendations for Other Services Other (comment) ? ?  ?Precautions  / Restrictions Precautions ?Precautions: Fall ?Precaution Comments: Uses O2 for longer ambulation distances ?Restrictions ?Weight Bearing Restrictions: No  ? ? ?  ? ?Mobility Bed Mobility ?  ?  ?  ?  ?  ?  ?  ?  ?  ? ?Transfers ?Overall transfer level: Needs assistance ?Equipment used: Rollator (4 wheels) ?Transfers: Sit to/from Stand ?Sit to Stand: Supervision ?  ?  ?  ?  ?  ?General transfer comment: steady safe transfers ?  ?  ?Balance Overall balance assessment: Needs assistance ?Sitting-balance support: No upper extremity supported, Feet supported ?Sitting balance-Leahy Scale: Good ?Sitting balance - Comments: hunched posture (which pt states is related to abdominal pain) which impacts breathing ?  ?Standing balance support: No upper extremity supported, During functional activity ?Standing balance-Leahy Scale: Good ?Standing balance comment: fatigues quickly with extended standing ?  ?  ?  ?  ?  ?  ?  ?  ?  ?  ?  ?   ? ?ADL either performed or assessed with clinical judgement  ? ?ADL Overall ADL's : Modified independent ?  ?  ?  ?  ?  ?  ?  ?  ?  ?  ?  ?  ?  ?  ?  ?  ?  ?  ?  ?General ADL Comments: mod I for shower transfer, bathing, and dressing ?  ? ?Extremity/Trunk Assessment Upper Extremity Assessment ?Upper Extremity Assessment: Overall WFL for tasks assessed ?  ?Lower Extremity Assessment ?Lower Extremity Assessment: Overall WFL for tasks assessed ?  ?Cervical / Trunk Assessment ?Cervical / Trunk Assessment: Kyphotic ?  ? ?Vision   ?  ?  ?Perception   ?  ?  Praxis   ?  ? ?Cognition Arousal/Alertness: Awake/alert ?Behavior During Therapy: [facility] for tasks assessed/performed ?Overall Cognitive Status: Within Functional Limits for tasks assessed ?  ?  ?  ?  ?  ?  ?  ?  ?  ?  ?  ?  ?  ?  ?  ?  ?  ?  ?  ?   ?Exercises Other Exercises ?Other Exercises: Therex in standing, standing balance tolerance, therapeutic listening, cognitive reframing, non-pharmacological pain mgmt strategies ? ?  ?Shoulder Instructions    ? ? ?  ?General Comments    ? ? ?Pertinent Vitals/ Pain       Pain Assessment ?Pain Score: 6  ?Pain Location: mid back, abdomen ?Pain Descriptors / Indicators: Aching, Tender, Tightness ?Pain Intervention(s): Limited activity within patient's tolerance, Utilized relaxation techniques, Repositioned ? ?Home Living   ?  ?  ?  ?  ?  ?  ?  ?  ?  ?  ?  ?  ?  ?  ?  ?  ?  ?  ? ?  ?Prior Functioning/Environment    ?  ?  ?  ?   ? ?Frequency ? Min 2X/week  ? ? ? ? ?  ?Progress Toward Goals ? ?OT Goals(current goals can now be found in the care plan section) ? Progress towards OT goals: Progressing toward goals ? ?Acute Rehab OT Goals ?Patient Stated Goal: to feel better and not have pain ?Time For Goal Achievement: 07/21/21 ?Potential to Achieve Goals: Good  ?Plan Discharge plan remains appropriate;Frequency needs to be updated   ? ?Co-evaluation ? ? ?   ?  ?  ?  ?  ? ?  ?AM-PAC OT "6 Clicks" Daily Activity     ?Outcome Measure ? ? Help from another person eating meals?: None ?Help from another person taking care of personal grooming?: None ?Help from another person toileting, which includes using toliet, bedpan, or urinal?: None ?Help from another person bathing (including washing, rinsing, drying)?: None ?Help from another person to put on and taking off regular upper body clothing?: None ?Help from another person to put on and taking off regular lower body clothing?: None ?6 Click Score: 24 ? ?  ?End of Session Equipment Utilized During Treatment: Rollator (4 wheels) ? ?OT Visit Diagnosis: Unsteadiness on feet (R26.81);Muscle weakness (generalized) (M62.81);Pain;Other symptoms and signs involving cognitive function ?  ?Activity Tolerance Patient tolerated treatment well ?  ?Patient Left Other (comment) (by nurses' station) ?  ?Nurse Communication   ?  ? ?   ? ?Time: 1749-4496 ?OT Time Calculation (min): 50 min ? ?Charges: OT General Charges ?$OT Visit: 1 Visit ?OT Treatments ?$Self Care/Home Management : 38-52  mins ? ?Josiah Lobo, PhD, MS, OTR/L ?07/09/21, 4:08 PM ? ?

## 2021-07-09 NOTE — Progress Notes (Signed)
Recreation Therapy Notes ? ?Date: 07/09/2021 ?  ?Time: 1:35pm   ?  ?Location: Dayroom  ?  ?Behavioral response: Appropriate ?  ?Intervention Topic: Communication   ?  ?Discussion/Intervention:  ?Group content today was focused on communication. The group defined communication and ways to communicate with others. Individuals stated reason why communication is important and some reasons to communicate with others. Patients expressed if they thought they were good at communicating with others and ways they could improve their communication skills. The group identified important parts of communication and some experiences they have had in the past with communication. The group participated in the intervention ?What is that??, where they had a chance to test out their communication skills and identify ways to improve their communication techniques. ?Clinical Observations/Feedback: ?Patient came to group late and observed.  ?Aadvika Konen LRT/CTRS  ? ? ? ? ? ? ? ?Karyl Sharrar ?07/09/2021 2:32 PM ?

## 2021-07-09 NOTE — Progress Notes (Signed)
Pt lying in bed with eyes closed; easily aroused when name called. Pt states "I feel alright." She c/o pain in stomach that "pulls" through back, which she rates 2/10. She reports that the doctor increased her gabapentin and it has really helped with her pain. She denies SI/HI/AVH, anxiety and depression at this time. She states "I didn't sleep that well last night" d/t pain. She describes her appetite as "good". No acute distress noted. ?

## 2021-07-09 NOTE — Progress Notes (Signed)
Patient remains alert and oriented, calm and cooperative during assessment. Denies SI, HI, AVH. Denies anxiety and depression. Ate meals in the day room among peers. Compliant with all due medications. Remain safe on the unit with Q15 minutes safety checks. ?

## 2021-07-09 NOTE — Progress Notes (Signed)
Physical Therapy Treatment ?Patient Details ?Name: Tina Bailey ?MRN: [id number] ?DOB: 06/02/1945 ?Today's Date: 07/09/2021 ? ? ?History of Present Illness Pt is a 76 y.o. female presenting to hospital 4/16 with chief complaint of suicidal intent and depression.  Seen in ED 2 days prior with presumed unintentional overdose (pt later reported it was a suicide attempt).  PMH includes chronic pain, fibromyalgia, O2 dependent, CHF, skin picking habit, TAA without rupture, COPD, and R IMN 2019 ? ?  ?PT Comments  ? ? Pt resting in bed upon PT arrival; agreeable to PT session.  Performed L sidelying and standing transverse abdominal (TA) contraction exercises and also gentle TA contraction with ambulation in room (using rollator)--vc's required for technique during sessions activities.  Pt reporting pain overall has been improving (pt reports having medication changes that have been helping).  Will continue to focus on core strengthening during hospitalization. ?  ?Recommendations for follow up therapy are one component of a multi-disciplinary discharge planning process, led by the attending physician.  Recommendations may be updated based on patient status, additional functional criteria and insurance authorization. ? ?Follow Up Recommendations ? Outpatient PT ?  ?  ?Assistance Recommended at Discharge PRN  ?Patient can return home with the following Assistance with cooking/housework;Assist for transportation ?  ?Equipment Recommendations ? None recommended by PT  ?  ?Recommendations for Other Services   ? ? ?  ?Precautions / Restrictions Precautions ?Precautions: Fall ?Precaution Comments: Uses O2 for longer ambulation distances ?Restrictions ?Weight Bearing Restrictions: No  ?  ? ?Mobility ? Bed Mobility ?Overal bed mobility: Modified Independent ?Bed Mobility: Sidelying to Sit, Sit to Sidelying ?  ?Sidelying to sit: Modified independent (Device/Increase time) ?  ?  ?Sit to sidelying: Modified independent  (Device/Increase time) ?General bed mobility comments: no difficulties noted ?  ? ?Transfers ?Overall transfer level: Modified independent ?Equipment used: Rollator (4 wheels) ?  ?  ?  ?  ?  ?  ?  ?General transfer comment: steady safe transfers ?  ? ?Ambulation/Gait ?Ambulation/Gait assistance: Modified independent (Device/Increase time) ?Gait Distance (Feet): 30 Feet ?Assistive device: Rollator (4 wheels) ?  ?Gait velocity: decreased ?  ?  ?General Gait Details: steady step through gait pattern with focus on breathing and gentle TA/core activation ? ? ?Stairs ?  ?  ?  ?  ?  ? ? ?Wheelchair Mobility ?  ? ?Modified Rankin (Stroke Patients Only) ?  ? ? ?  ?Balance Overall balance assessment: Needs assistance ?Sitting-balance support: No upper extremity supported, Feet supported ?Sitting balance-Leahy Scale: Normal ?Sitting balance - Comments: steady sitting reaching outside BOS ?  ?Standing balance support: No upper extremity supported ?Standing balance-Leahy Scale: Good ?Standing balance comment: steady standing reaching within BOS ?  ?  ?  ?  ?  ?  ?  ?  ?  ?  ?  ?  ? ?  ?Cognition Arousal/Alertness: Awake/alert ?Behavior During Therapy: Anxious ?Overall Cognitive Status: Within Functional Limits for tasks assessed ?  ?  ?  ?  ?  ?  ?  ?  ?  ?  ?  ?  ?  ?  ?  ?  ?General Comments: Verbose and tangential but very pleasant ?  ?  ? ?  ?Exercises Other Exercises ?Other Exercises: Gentle core therex in L sidelying with TA contractions x10 reps with 5 second holds ?Other Exercises: Gentle core therex in standing with TA contractions x10 reps with 5 second holds ? ?  ?General Comments  Nursing cleared  pt for participation in physical therapy.  Pt agreeable to PT session. ?  ?  ? ?Pertinent Vitals/Pain Pain Assessment ?Pain Assessment: Faces ?Faces Pain Scale: Hurts a little bit ?Pain Location: xiphoid process and just distal to xiphoid process and mid thoracic spine (pt reports pain pierces through straight to her  back) ?Pain Descriptors / Indicators: Tender, Constant ?Pain Intervention(s): Limited activity within patient's tolerance, Monitored during session, Repositioned (pt reports recent pain meds)  ? ? ?Home Living   ?  ?  ?  ?  ?  ?  ?  ?  ?  ?   ?  ?Prior Function    ?  ?  ?   ? ?PT Goals (current goals can now be found in the care plan section) Acute Rehab PT Goals ?Patient Stated Goal: to improve pain ?PT Goal Formulation: With patient ?Time For Goal Achievement: 07/20/21 ?Potential to Achieve Goals: Fair ?Progress towards PT goals: Progressing toward goals ? ?  ?Frequency ? ? ? Min 2X/week ? ? ? ?  ?PT Plan Current plan remains appropriate  ? ? ?Co-evaluation   ?  ?  ?  ?  ? ?  ?AM-PAC PT "6 Clicks" Mobility   ?Outcome Measure ? Help needed turning from your back to your side while in a flat bed without using bedrails?: None ?Help needed moving from lying on your back to sitting on the side of a flat bed without using bedrails?: None ?Help needed moving to and from a bed to a chair (including a wheelchair)?: None ?Help needed standing up from a chair using your arms (e.g., wheelchair or bedside chair)?: None ?Help needed to walk in hospital room?: None ?Help needed climbing 3-5 steps with a railing? : None ?6 Click Score: 24 ? ?  ?End of Session Equipment Utilized During Treatment: Gait belt ?Activity Tolerance: Patient tolerated treatment well ?Patient left: in bed ?Nurse Communication: Mobility status ?PT Visit Diagnosis: Pain;Muscle weakness (generalized) (M62.81) ?  ? ? ?Time: 4270-6237 ?PT Time Calculation (min) (ACUTE ONLY): 17 min ? ?Charges:  $Therapeutic Exercise: 8-22 mins          ?          ?Leitha Bleak, PT ?07/09/21, 3:40 PM ? ? ?

## 2021-07-09 NOTE — Progress Notes (Signed)
[facility] MD Progress Note ? ?07/09/2021 12:57 PM ?[facility]  ?MRN:  [id number] ?Subjective: [name] was seen on rounds today.  She states that she had a hard time with sleep last night because of pain but generally is feeling better.  She states that her mood is getting better.  She is smiling.  She is taking her medications as prescribed and denies any side effects. ? ?Principal Problem: Severe recurrent major depression without psychotic features ([place]) ?Diagnosis: Principal Problem: ?  Severe recurrent major depression without psychotic features ([place]) ? ?Total Time spent with patient: 15 minutes ? ?Past Psychiatric History: None ? ?Past Medical History:  ?Past Medical History:  ?Diagnosis Date  ? Arthritis   ? DDD. Right shoulder"is frozen"-limited ROM. osteoporosis.  ? Blue toe syndrome ([place]) 11/27/2014  ? Dr. Claudia Pollock evaluating  ? CHF (congestive heart failure) ([place])   ? Complication of anesthesia   ? COPD (chronic obstructive pulmonary disease) ([place])   ? Fibromyalgia   ? Fracture of rib of right side   ? hx "osteoporosis"- states her dog nudge her on the side, next day developed great pain and was told has a fracture rib.  ? Liver cancer ([place]) 12/2014  ? Macular degeneration   ? R eye  ? Osteoporosis   ? PONV (postoperative nausea and vomiting)   ? nausea,severe vomiting after 01-13-15 portal vein embolization  ? Productive cough   ? Retina disorder   ? L eye, vision distorted, edema  ? Spine fracture due to birth trauma   ? TMJ disease   ? Wears glasses   ?  ?Past Surgical History:  ?Procedure Laterality Date  ? ANGIOPLASTY  2008  ? no stents required, no follow-up with cardiologist, no recurrent chest pain  ? CATARACT EXTRACTION, BILATERAL Bilateral   ? ESOPHAGOGASTRODUODENOSCOPY (EGD) WITH PROPOFOL N/A 06/25/2015  ? Procedure: ESOPHAGOGASTRODUODENOSCOPY (EGD) WITH PROPOFOL;  Surgeon: Milus Banister, MD;  [facility]: [facility];  Service: Endoscopy;  Laterality: N/A;  stent removal   ? EYE SURGERY    ?  cornea surgery  ? INTRAMEDULLARY (IM) NAIL INTERTROCHANTERIC Right 12/04/2017  ? Procedure: INTRAMEDULLARY (IM) NAIL INTERTROCHANTRIC;  Surgeon: Rod Can, MD;  Location: [facility];  Service: Orthopedics;  Laterality: Right;  ? IR RADIOLOGIST EVAL & MGMT  03/29/2017  ? KIDNEY STONE SURGERY    ? LAPAROSCOPIC PARTIAL HEPATECTOMY N/A 03/04/2015  ? Procedure: DIAGNOSTIC LAPAROSCOPY, EXTENDED RIGHT HEPATECTOMY, WITH INTRAOPERATIVE ULTRASOUND;  Surgeon: Stark Klein, MD;  Location: [facility];  Service: General;  Laterality: N/A;  ? PARTIAL HYSTERECTOMY    ? portal vein embolization    ? 01-13-15 -Dr. Cathlean Sauer.  ? ?Family History:  ?Family History  ?Problem Relation Age of Onset  ? Hypertension Mother   ? Atrial fibrillation Mother   ? Heart disease Mother   ?     after age 35  ? Breast cancer Mother   ? Stroke Father   ? Dementia Father   ? Peptic Ulcer Father   ? Heart disease Brother   ?     After age 43- A-Fib  ? Heart attack Brother   ? ? ?Social History:  ?Social History  ? ?Substance and Sexual Activity  ?Alcohol Use No  ? Alcohol/week: 0.0 standard drinks  ?   ?Social History  ? ?Substance and Sexual Activity  ?Drug Use No  ?  ?Social History  ? ?Socioeconomic History  ? Marital status: Divorced  ?  Spouse name: Not on file  ?  Number of children: 3  ? Years of education: Not on file  ? Highest education level: Not on file  ?Occupational History  ? Occupation: retired  ?Tobacco Use  ? Smoking status: Former  ?  Packs/day: 1.00  ?  Years: 45.00  ?  Pack years: 45.00  ?  Types: Cigarettes  ?  Quit date: 02/20/2018  ?  Years since quitting: 3.3  ? Smokeless tobacco: Never  ?Vaping Use  ? Vaping Use: Never used  ?Substance and Sexual Activity  ? Alcohol use: No  ?  Alcohol/week: 0.0 standard drinks  ? Drug use: No  ? Sexual activity: Not Currently  ?Other Topics Concern  ? Not on file  ?Social History Narrative  ? Not on file  ? ?Social Determinants of Health  ? ?Financial Resource Strain: Not on file  ?Food  Insecurity: Not on file  ?Transportation Needs: Not on file  ?Physical Activity: Not on file  ?Stress: Not on file  ?Social Connections: Not on file  ? ?Additional Social History:  ?  ?  ?  ?  ?  ?  ?  ?  ?  ?  ?  ? ?Sleep: Good ? ?Appetite:  Good ? ?Current Medications: ?Current Facility-Administered Medications  ?Medication Dose Route Frequency Provider Last Rate Last Admin  ? acetaminophen (TYLENOL) tablet 650 mg  650 mg Oral Q6H PRN Clapacs, Madie Reno, MD   650 mg at 07/08/21 0846  ? albuterol (PROVENTIL) (2.5 MG/3ML) 0.083% nebulizer solution 2.5 mg  2.5 mg Nebulization Q4H PRN Clapacs, John T, MD      ? alum & mag hydroxide-simeth (MAALOX/MYLANTA) 200-200-20 MG/5ML suspension 30 mL  30 mL Oral Q4H PRN Clapacs, John T, MD      ? amLODipine (NORVASC) tablet 5 mg  5 mg Oral Daily Parks Ranger, DO   5 mg at 07/09/21 5366  ? aspirin-acetaminophen-caffeine (EXCEDRIN MIGRAINE) per tablet 1-2 tablet  1-2 tablet Oral Q6H PRN Clapacs, Madie Reno, MD      ? cholecalciferol (VITAMIN D3) tablet 2,000 Units  2,000 Units Oral QPC lunch Clapacs, Madie Reno, MD   2,000 Units at 07/08/21 1215  ? fluticasone furoate-vilanterol (BREO ELLIPTA) 200-25 MCG/ACT 1 puff  1 puff Inhalation Daily Benita Gutter, RPH   1 puff at 07/09/21 4403  ? gabapentin (NEURONTIN) capsule 200 mg  200 mg Oral TID Parks Ranger, DO   200 mg at 07/09/21 4742  ? HYDROcodone-acetaminophen (NORCO) 10-325 MG per tablet 1 tablet  1 tablet Oral Q6H Clapacs, Madie Reno, MD   1 tablet at 07/09/21 5956  ? LORazepam (ATIVAN) tablet 1 mg  1 mg Oral QHS PRN Parks Ranger, DO      ? magnesium hydroxide (MILK OF MAGNESIA) suspension 30 mL  30 mL Oral Daily PRN Clapacs, Madie Reno, MD      ? mirtazapine (REMERON) tablet 15 mg  15 mg Oral QHS Parks Ranger, DO   15 mg at 07/08/21 2200  ? nicotine (NICODERM CQ - dosed in mg/24 hours) patch 21 mg  21 mg Transdermal Daily Clapacs, Madie Reno, MD   21 mg at 07/07/21 0940  ? OLANZapine (ZYPREXA) tablet  2.5 mg  2.5 mg Oral Q6H PRN Parks Ranger, DO      ? risperiDONE (RISPERDAL) tablet 0.25 mg  0.25 mg Oral BH-[id number] Parks Ranger, DO   0.25 mg at 07/09/21 3875  ? traZODone (DESYREL) tablet 50 mg  50 mg Oral QHS PRN  Clapacs, Madie Reno, MD      ? umeclidinium bromide (INCRUSE ELLIPTA) 62.5 MCG/ACT 1 puff  1 puff Inhalation Daily Benita Gutter, RPH   1 puff at 07/09/21 4970  ? ? ?Lab Results: No results found for this or any previous visit (from the past 48 hour(s)). ? ?Blood Alcohol level:  ?Lab Results  ?Component Value Date  ? ETH <10 07/04/2021  ? ? ?Metabolic Disorder Labs: ?Lab Results  ?Component Value Date  ? HGBA1C 6.2 05/24/2021  ? ?No results found for: PROLACTIN ?Lab Results  ?Component Value Date  ? CHOL 190 05/24/2021  ? TRIG 65.0 05/24/2021  ? HDL 90.80 05/24/2021  ? CHOLHDL 2 05/24/2021  ? VLDL 13.0 05/24/2021  ? [place] 86 05/24/2021  ? [place] 82 01/09/2017  ? ? ?Physical Findings: ?AIMS:  , ,  ,  ,    ?CIWA:    ?COWS:    ? ?Musculoskeletal: ?Strength & Muscle Tone: within normal limits ?Gait & Station: normal ?Patient leans: N/A ? ?Psychiatric Specialty Exam: ? ?Presentation  ?General Appearance: No data recorded ?Eye Contact:No data recorded ?Speech:No data recorded ?Speech Volume:No data recorded ?Handedness:No data recorded ? ?Mood and Affect  ?Mood:No data recorded ?Affect:No data recorded ? ?Thought Process  ?Thought Processes:No data recorded ?Descriptions of Associations:No data recorded ?Orientation:No data recorded ?Thought Content:No data recorded ?History of Schizophrenia/Schizoaffective disorder:No ? ?Duration of Psychotic Symptoms:No data recorded ?Hallucinations:No data recorded ?Ideas of Reference:No data recorded ?Suicidal Thoughts:No data recorded ?Homicidal Thoughts:No data recorded ? ?Sensorium  ?Memory:No data recorded ?Judgment:No data recorded ?Insight:No data recorded ? ?Executive Functions  ?Concentration:No data recorded ?Attention Span:No data  recorded ?Recall:No data recorded ?[employer] [place] recorded ?Language:No data recorded ? ?Psychomotor Activity  ?Psychomotor Activity:No data recorded ? ?Assets  ?Assets:No data recorded ? ?Sleep  ?Sleep:No data

## 2021-07-10 DIAGNOSIS — F332 Major depressive disorder, recurrent severe without psychotic features: Secondary | ICD-10-CM | POA: Diagnosis not present

## 2021-07-10 MED ORDER — GABAPENTIN 100 MG PO CAPS
100.0000 mg | ORAL_CAPSULE | Freq: Three times a day (TID) | ORAL | Status: DC
Start: 2021-07-10 — End: 2021-07-12
  Administered 2021-07-10 – 2021-07-12 (×6): 100 mg via ORAL
  Filled 2021-07-10 (×6): qty 1

## 2021-07-10 NOTE — Group Note (Signed)
[facility] LCSW Group Therapy Note ? ? ? ?Group Date: 07/10/2021 ?Start Time: 1430 ?End Time: 3299 ? ?Type of Therapy and Topic:  Group Therapy:  Overcoming Obstacles ? ?Participation Level:  [facility] PARTICIPATION LEVEL: Active ? ? ? ?Description of Group:   ?In this group patients will be encouraged to explore what they see as obstacles to their own wellness and recovery. They will be guided to discuss their thoughts, feelings, and behaviors related to these obstacles. The group will process together ways to cope with barriers, with attention given to specific choices patients can make. Each patient will be challenged to identify changes they are motivated to make in order to overcome their obstacles. This group will be process-oriented, with patients participating in exploration of their own experiences as well as giving and receiving support and challenge from other group members. ? ?Therapeutic Goals: ?1. Patient will identify personal and current obstacles as they relate to admission. ?2. Patient will identify barriers that currently interfere with their wellness or overcoming obstacles.  ?3. Patient will identify feelings, thought process and behaviors related to these barriers. ?4. Patient will identify two changes they are willing to make to overcome these obstacles:  ? ? ?Summary of Patient Progress ? ? ?Patient was present for the entirety of the group session. Patient was an active listener and participated in the topic of discussion, provided helpful advice to others, and added nuance to topic of conversation.  CSW led discussion doing a worksheet for change activity pertaining to overcoming obstacles. She stated that she wanted to learn how to keep going when she felt defeated because in the past she would just give up.  ? ? ? ?Therapeutic Modalities:   ?Cognitive Behavioral Therapy ?Solution Focused Therapy ?Motivational Interviewing ?Relapse Prevention  Therapy ? ? ?Helane Briceno A [place], LCSWA ?

## 2021-07-10 NOTE — Progress Notes (Signed)
Pt standing at nurses' station; calm, cooperative. Pt states "I just feel tired." Pt c/o epigastric pain that "goes through" to her back; she rates pain 4/10; however she declines PRN pain med at this time, stating "The pain won't be so bad if I lay down." She currently denies SI/HI/AVH. She describes her sleep and appetite as "good". She also denies anxiety and depression. No acute distress noted. ?

## 2021-07-10 NOTE — Plan of Care (Signed)
Patient is alert and oriented, calm and compliant during assessment. Patient denies SI, HI, AVH. Denies anxiety and depression. Reports back pain, medicated with scheduled pain management. Reports sleeping good last night.  Ate breakfast in the day room among peers and tolerated well. Patient is currently in the day room watching TV in no apparent distress. Remain safe on the unit with Q 15 minutes safety checks. ? ?Problem: Education: ?Goal: Knowledge of General Education information will improve ?Description: Including pain rating scale, medication(s)/side effects and non-pharmacologic comfort measures ?Outcome: Progressing ?  ?Problem: Health Behavior/Discharge Planning: ?Goal: Ability to manage health-related needs will improve ?Outcome: Progressing ?  ?Problem: Clinical Measurements: ?Goal: Ability to maintain clinical measurements within normal limits will improve ?Outcome: Progressing ?Goal: Will remain free from infection ?Outcome: Progressing ?Goal: Diagnostic test results will improve ?Outcome: Progressing ?Goal: Respiratory complications will improve ?Outcome: Progressing ?Goal: Cardiovascular complication will be avoided ?Outcome: Progressing ?  ?Problem: Activity: ?Goal: Risk for activity intolerance will decrease ?Outcome: Progressing ?  ?Problem: Nutrition: ?Goal: Adequate nutrition will be maintained ?Outcome: Progressing ?  ?Problem: Coping: ?Goal: Level of anxiety will decrease ?Outcome: Progressing ?  ?Problem: Elimination: ?Goal: Will not experience complications related to bowel motility ?Outcome: Progressing ?Goal: Will not experience complications related to urinary retention ?Outcome: Progressing ?  ?Problem: Pain Managment: ?Goal: General experience of comfort will improve ?Outcome: Progressing ?  ?Problem: Safety: ?Goal: Ability to remain free from injury will improve ?Outcome: Progressing ?  ?Problem: Skin Integrity: ?Goal: Risk for impaired skin integrity will decrease ?Outcome:  Progressing ?  ?Problem: Education: ?Goal: Ability to make informed decisions regarding treatment will improve ?Outcome: Progressing ?  ?Problem: Coping: ?Goal: Coping ability will improve ?Outcome: Progressing ?  ?Problem: Health Behavior/Discharge Planning: ?Goal: Identification of resources available to assist in meeting health care needs will improve ?Outcome: Progressing ?  ?Problem: Medication: ?Goal: Compliance with prescribed medication regimen will improve ?Outcome: Progressing ?  ?Problem: Self-Concept: ?Goal: Ability to disclose and discuss suicidal ideas will improve ?Outcome: Progressing ?Goal: Will verbalize positive feelings about self ?Outcome: Progressing ?  ?

## 2021-07-10 NOTE — Progress Notes (Signed)
[facility] MD Progress Note ? ?07/10/2021 1:56 PM ?Tina Bailey  ?MRN:  [id number] ?Subjective:  ?Patient is seen in her room today.  Tells me that she is feeling "fuzzy and drunk," believes it might be due to the gabapentin and requests this to be decreased.  Says that she has been on hydrocodone for a long time, and reports chronic pain issues.  She slept fair last night.  Hopeful that her pain will improve and that depression is commensurate with this.  Depression is mild, denies anxiety.  No stressors but does reflect that she has been feeling alone for the past 3 to 4 months especially due to residents at the assisted living facility that she was at being able to do more things physically than she can.  "This really affects my self-esteem."  Says that rehab has really helped in the past.  She becomes tearful when talking about her mother who passed away in Jun 06, 2002 and then father in June 06, 2006, and then brother 2 days later after her father died.  Speaks fondly about growing up with him and [place].  States that she is sensitive to noise on the unit.  Denies constipation.  She is talkative and bright.  Denies thoughts of suicide. ? ?Principal Problem: Severe recurrent major depression without psychotic features ([place]) ?Diagnosis: Principal Problem: ?  Severe recurrent major depression without psychotic features ([place]) ? ?Total Time spent with patient: 37 minutes ? ?Past Psychiatric History: None ? ?Past Medical History:  ?Past Medical History:  ?Diagnosis Date  ? Arthritis   ? DDD. Right shoulder"is frozen"-limited ROM. osteoporosis.  ? Blue toe syndrome ([place]) 11/27/2014  ? Dr. Claudia Pollock evaluating  ? CHF (congestive heart failure) ([place])   ? Complication of anesthesia   ? COPD (chronic obstructive pulmonary disease) ([place])   ? Fibromyalgia   ? Fracture of rib of right side   ? hx "osteoporosis"- states her dog nudge her on the side, next day developed great pain and was told has a fracture rib.  ? Liver cancer ([place])  12/2014  ? Macular degeneration   ? R eye  ? Osteoporosis   ? PONV (postoperative nausea and vomiting)   ? nausea,severe vomiting after 01-13-15 portal vein embolization  ? Productive cough   ? Retina disorder   ? L eye, vision distorted, edema  ? Spine fracture due to birth trauma   ? TMJ disease   ? Wears glasses   ?  ?Past Surgical History:  ?Procedure Laterality Date  ? ANGIOPLASTY  2006-06-06  ? no stents required, no follow-up with cardiologist, no recurrent chest pain  ? CATARACT EXTRACTION, BILATERAL Bilateral   ? ESOPHAGOGASTRODUODENOSCOPY (EGD) WITH PROPOFOL N/A 06/25/2015  ? Procedure: ESOPHAGOGASTRODUODENOSCOPY (EGD) WITH PROPOFOL;  Surgeon: Milus Banister, MD;  [facility];  Service: Endoscopy;  Laterality: N/A;  stent removal   ? EYE SURGERY    ? cornea surgery  ? INTRAMEDULLARY (IM) NAIL INTERTROCHANTERIC Right 12/04/2017  ? Procedure: INTRAMEDULLARY (IM) NAIL INTERTROCHANTRIC;  Surgeon: Rod Can, MD;  [facility];  Service: Orthopedics;  Laterality: Right;  ? IR RADIOLOGIST EVAL & MGMT  03/29/2017  ? KIDNEY STONE SURGERY    ? LAPAROSCOPIC PARTIAL HEPATECTOMY N/A 03/04/2015  ? Procedure: DIAGNOSTIC LAPAROSCOPY, EXTENDED RIGHT HEPATECTOMY, WITH INTRAOPERATIVE ULTRASOUND;  Surgeon: Stark Klein, MD;  Location: [facility];  Service: General;  Laterality: N/A;  ? PARTIAL HYSTERECTOMY    ? portal vein embolization    ? 01-13-15 -Dr. Cathlean Sauer.  ? ?  Family History:  ?Family History  ?Problem Relation Age of Onset  ? Hypertension Mother   ? Atrial fibrillation Mother   ? Heart disease Mother   ?     after age 24  ? Breast cancer Mother   ? Stroke Father   ? Dementia Father   ? Peptic Ulcer Father   ? Heart disease Brother   ?     After age 33- A-Fib  ? Heart attack Brother   ? ? ?Social History:  ?Social History  ? ?Substance and Sexual Activity  ?Alcohol Use No  ? Alcohol/week: 0.0 standard drinks  ?   ?Social History  ? ?Substance and Sexual Activity  ?Drug Use No  ?  ?Social History   ? ?Socioeconomic History  ? Marital status: Divorced  ?  Spouse name: Not on file  ? Number of children: 3  ? Years of education: Not on file  ? Highest education level: Not on file  ?Occupational History  ? Occupation: retired  ?Tobacco Use  ? Smoking status: Former  ?  Packs/day: 1.00  ?  Years: 45.00  ?  Pack years: 45.00  ?  Types: Cigarettes  ?  Quit date: 02/20/2018  ?  Years since quitting: 3.3  ? Smokeless tobacco: Never  ?Vaping Use  ? Vaping Use: Never used  ?Substance and Sexual Activity  ? Alcohol use: No  ?  Alcohol/week: 0.0 standard drinks  ? Drug use: No  ? Sexual activity: Not Currently  ?Other Topics Concern  ? Not on file  ?Social History Narrative  ? Not on file  ? ?Social Determinants of Health  ? ?Financial Resource Strain: Not on file  ?Food Insecurity: Not on file  ?Transportation Needs: Not on file  ?Physical Activity: Not on file  ?Stress: Not on file  ?Social Connections: Not on file  ? ?Additional Social History:  ?  ?  ?  ?  ?  ?  ?  ?  ?  ?  ?  ? ?Sleep: Good ? ?Appetite:  Good ? ?Current Medications: ?Current Facility-Administered Medications  ?Medication Dose Route Frequency Provider Last Rate Last Admin  ? acetaminophen (TYLENOL) tablet 650 mg  650 mg Oral Q6H PRN Clapacs, Madie Reno, MD   650 mg at 07/10/21 1249  ? albuterol (PROVENTIL) (2.5 MG/3ML) 0.083% nebulizer solution 2.5 mg  2.5 mg Nebulization Q4H PRN Clapacs, Madie Reno, MD      ? alum & mag hydroxide-simeth (MAALOX/MYLANTA) 200-200-20 MG/5ML suspension 30 mL  30 mL Oral Q4H PRN Clapacs, John T, MD      ? amLODipine (NORVASC) tablet 5 mg  5 mg Oral Daily Parks Ranger, DO   5 mg at 07/10/21 3710  ? aspirin-acetaminophen-caffeine (EXCEDRIN MIGRAINE) per tablet 1-2 tablet  1-2 tablet Oral Q6H PRN Clapacs, Madie Reno, MD      ? cholecalciferol (VITAMIN D3) tablet 2,000 Units  2,000 Units Oral QPC lunch Clapacs, Madie Reno, MD   2,000 Units at 07/10/21 1249  ? fluticasone furoate-vilanterol (BREO ELLIPTA) 200-25 MCG/ACT 1 puff  1  puff Inhalation Daily Benita Gutter, RPH   1 puff at 07/10/21 6269  ? gabapentin (NEURONTIN) capsule 100 mg  100 mg Oral TID Rulon Sera, MD      ? HYDROcodone-acetaminophen [facility]) 10-325 MG per tablet 1 tablet  1 tablet Oral Q6H Clapacs, Madie Reno, MD   1 tablet at 07/10/21 [phone]  ? LORazepam (ATIVAN) tablet 1 mg  1 mg Oral QHS PRN  Parks Ranger, DO      ? magnesium hydroxide (MILK OF MAGNESIA) suspension 30 mL  30 mL Oral Daily PRN Clapacs, John T, MD      ? mirtazapine (REMERON) tablet 15 mg  15 mg Oral QHS Parks Ranger, DO   15 mg at 07/09/21 2150  ? nicotine (NICODERM CQ - dosed in mg/24 hours) patch 21 mg  21 mg Transdermal Daily Clapacs, Madie Reno, MD   21 mg at 07/07/21 0940  ? OLANZapine (ZYPREXA) tablet 2.5 mg  2.5 mg Oral Q6H PRN Parks Ranger, DO      ? risperiDONE (RISPERDAL) tablet 0.25 mg  0.25 mg Oral BH-q8a4p Parks Ranger, DO   0.25 mg at 07/10/21 6384  ? traZODone (DESYREL) tablet 50 mg  50 mg Oral QHS PRN Clapacs, John T, MD      ? umeclidinium bromide (INCRUSE ELLIPTA) 62.5 MCG/ACT 1 puff  1 puff Inhalation Daily Benita Gutter, RPH   1 puff at 07/10/21 5364  ? ? ?Lab Results: No results found for this or any previous visit (from the past 48 hour(s)). ? ?Blood Alcohol level:  ?Lab Results  ?Component Value Date  ? ETH <10 07/04/2021  ? ? ?Metabolic Disorder Labs: ?Lab Results  ?Component Value Date  ? HGBA1C 6.2 05/24/2021  ? ?No results found for: PROLACTIN ?Lab Results  ?Component Value Date  ? CHOL 190 05/24/2021  ? TRIG 65.0 05/24/2021  ? HDL 90.80 05/24/2021  ? CHOLHDL 2 05/24/2021  ? VLDL 13.0 05/24/2021  ? [place] 86 05/24/2021  ? [place] 82 01/09/2017  ? ? ?Physical Findings: ?AIMS:  , ,  ,  ,    ?CIWA:    ?COWS:    ? ?Musculoskeletal: ?Strength & Muscle Tone: within normal limits ?Gait & Station: normal ?Patient leans: N/A ? ?Psychiatric Specialty Exam: ? ?Presentation  ?General Appearance: No data recorded ?Eye Contact:No data  recorded ?Speech:No data recorded ?Speech Volume:No data recorded ?Handedness:No data recorded ? ?Mood and Affect  ?Mood:No data recorded ?Affect:No data recorded ? ?Thought Process  ?Thought Processes:No data recorded ?Descriptio

## 2021-07-11 DIAGNOSIS — F332 Major depressive disorder, recurrent severe without psychotic features: Secondary | ICD-10-CM | POA: Diagnosis not present

## 2021-07-11 NOTE — Progress Notes (Signed)
[facility] MD Progress Note ? ?07/11/2021 11:34 AM ?Tina Bailey  ?MRN:  [id number] ?Subjective:  ?4/23 ?Patient was seen walking the halls this morning.  Reports that her back pain is lower today, 3 out of 10.  She did sleep well.  Depression is "okay."  Denies safety concerns.  She had a good visit with her daughter yesterday.  Reports less dizziness this morning.  According to the nurse her O2 saturation was 84% upon awakening today, on recheck it was 90%. ? ?4/22 ?Patient is seen in her room today.  Tells me that she is feeling "fuzzy and drunk," believes it might be due to the gabapentin and requests this to be decreased.  Says that she has been on hydrocodone for a long time, and reports chronic pain issues.  She slept fair last night.  Hopeful that her pain will improve and that depression is commensurate with this.  Depression is mild, denies anxiety.  No stressors but does reflect that she has been feeling alone for the past 3 to 4 months especially due to residents at the assisted living facility that she was at being able to do more things physically than she can.  "This really affects my self-esteem."  Says that rehab has really helped in the past.  She becomes tearful when talking about her mother who passed away in 08-Jun-2002 and then father in 06/08/2006, and then brother 2 days later after her father died.  Speaks fondly about growing up with him and [place].  States that she is sensitive to noise on the unit.  Denies constipation.  She is talkative and bright.  Denies thoughts of suicide. ? ?Principal Problem: Severe recurrent major depression without psychotic features ([place]) ?Diagnosis: Principal Problem: ?  Severe recurrent major depression without psychotic features ([place]) ? ?Total Time spent with patient: 26 minutes ? ?Past Psychiatric History: None ? ?Past Medical History:  ?Past Medical History:  ?Diagnosis Date  ? Arthritis   ? DDD. Right shoulder"is frozen"-limited ROM. osteoporosis.  ? Blue toe  syndrome ([place]) 11/27/2014  ? Dr. Claudia Pollock evaluating  ? CHF (congestive heart failure) ([place])   ? Complication of anesthesia   ? COPD (chronic obstructive pulmonary disease) ([place])   ? Fibromyalgia   ? Fracture of rib of right side   ? hx "osteoporosis"- states her dog nudge her on the side, next day developed great pain and was told has a fracture rib.  ? Liver cancer ([place]) 12/2014  ? Macular degeneration   ? R eye  ? Osteoporosis   ? PONV (postoperative nausea and vomiting)   ? nausea,severe vomiting after 01-13-15 portal vein embolization  ? Productive cough   ? Retina disorder   ? L eye, vision distorted, edema  ? Spine fracture due to birth trauma   ? TMJ disease   ? Wears glasses   ?  ?Past Surgical History:  ?Procedure Laterality Date  ? ANGIOPLASTY  2006-06-08  ? no stents required, no follow-up with cardiologist, no recurrent chest pain  ? CATARACT EXTRACTION, BILATERAL Bilateral   ? ESOPHAGOGASTRODUODENOSCOPY (EGD) WITH PROPOFOL N/A 06/25/2015  ? Procedure: ESOPHAGOGASTRODUODENOSCOPY (EGD) WITH PROPOFOL;  Surgeon: Milus Banister, MD;  Location: [facility];  Service: Endoscopy;  Laterality: N/A;  stent removal   ? EYE SURGERY    ? cornea surgery  ? INTRAMEDULLARY (IM) NAIL INTERTROCHANTERIC Right 12/04/2017  ? Procedure: INTRAMEDULLARY (IM) NAIL INTERTROCHANTRIC;  Surgeon: Rod Can, MD;  Location: [facility];  Service: Orthopedics;  Laterality: Right;  ?  IR RADIOLOGIST EVAL & MGMT  03/29/2017  ? KIDNEY STONE SURGERY    ? LAPAROSCOPIC PARTIAL HEPATECTOMY N/A 03/04/2015  ? Procedure: DIAGNOSTIC LAPAROSCOPY, EXTENDED RIGHT HEPATECTOMY, WITH INTRAOPERATIVE ULTRASOUND;  Surgeon: Stark Klein, MD;  Location: [facility];  Service: General;  Laterality: N/A;  ? PARTIAL HYSTERECTOMY    ? portal vein embolization    ? 01-13-15 -Dr. Cathlean Sauer.  ? ?Family History:  ?Family History  ?Problem Relation Age of Onset  ? Hypertension Mother   ? Atrial fibrillation Mother   ? Heart disease Mother   ?     after age 103  ? Breast  cancer Mother   ? Stroke Father   ? Dementia Father   ? Peptic Ulcer Father   ? Heart disease Brother   ?     After age 43- A-Fib  ? Heart attack Brother   ? ? ?Social History:  ?Social History  ? ?Substance and Sexual Activity  ?Alcohol Use No  ? Alcohol/week: 0.0 standard drinks  ?   ?Social History  ? ?Substance and Sexual Activity  ?Drug Use No  ?  ?Social History  ? ?Socioeconomic History  ? Marital status: Divorced  ?  Spouse name: Not on file  ? Number of children: 3  ? Years of education: Not on file  ? Highest education level: Not on file  ?Occupational History  ? Occupation: retired  ?Tobacco Use  ? Smoking status: Former  ?  Packs/day: 1.00  ?  Years: 45.00  ?  Pack years: 45.00  ?  Types: Cigarettes  ?  Quit date: 02/20/2018  ?  Years since quitting: 3.3  ? Smokeless tobacco: Never  ?Vaping Use  ? Vaping Use: Never used  ?Substance and Sexual Activity  ? Alcohol use: No  ?  Alcohol/week: 0.0 standard drinks  ? Drug use: No  ? Sexual activity: Not Currently  ?Other Topics Concern  ? Not on file  ?Social History Narrative  ? Not on file  ? ?Social Determinants of Health  ? ?Financial Resource Strain: Not on file  ?Food Insecurity: Not on file  ?Transportation Needs: Not on file  ?Physical Activity: Not on file  ?Stress: Not on file  ?Social Connections: Not on file  ? ?Additional Social History:  ?  ?  ?  ?  ?  ?  ?  ?  ?  ?  ?  ? ?Sleep: Good ? ?Appetite:  Good ? ?Current Medications: ?Current Facility-Administered Medications  ?Medication Dose Route Frequency Provider Last Rate Last Admin  ? acetaminophen (TYLENOL) tablet 650 mg  650 mg Oral Q6H PRN Clapacs, Madie Reno, MD   650 mg at 07/10/21 1249  ? albuterol (PROVENTIL) (2.5 MG/3ML) 0.083% nebulizer solution 2.5 mg  2.5 mg Nebulization Q4H PRN Clapacs, Madie Reno, MD      ? alum & mag hydroxide-simeth (MAALOX/MYLANTA) 200-200-20 MG/5ML suspension 30 mL  30 mL Oral Q4H PRN Clapacs, John T, MD      ? amLODipine (NORVASC) tablet 5 mg  5 mg Oral Daily Parks Ranger, DO   5 mg at 07/11/21 4580  ? aspirin-acetaminophen-caffeine (EXCEDRIN MIGRAINE) per tablet 1-2 tablet  1-2 tablet Oral Q6H PRN Clapacs, Madie Reno, MD      ? cholecalciferol (VITAMIN D3) tablet 2,000 Units  2,000 Units Oral QPC lunch Clapacs, Madie Reno, MD   2,000 Units at 07/10/21 1249  ? fluticasone furoate-vilanterol (BREO ELLIPTA) 200-25 MCG/ACT 1 puff  1 puff Inhalation Daily  Benita Gutter, RPH   1 puff at 07/11/21 8127  ? gabapentin (NEURONTIN) capsule 100 mg  100 mg Oral TID Rulon Sera, MD   100 mg at 07/11/21 5170  ? HYDROcodone-acetaminophen (NORCO) 10-325 MG per tablet 1 tablet  1 tablet Oral Q6H Clapacs, Madie Reno, MD   1 tablet at 07/11/21 [phone]  ? LORazepam (ATIVAN) tablet 1 mg  1 mg Oral QHS PRN Parks Ranger, DO      ? magnesium hydroxide (MILK OF MAGNESIA) suspension 30 mL  30 mL Oral Daily PRN Clapacs, John T, MD      ? mirtazapine (REMERON) tablet 15 mg  15 mg Oral QHS Parks Ranger, DO   15 mg at 07/10/21 2108  ? nicotine (NICODERM CQ - dosed in mg/24 hours) patch 21 mg  21 mg Transdermal Daily Clapacs, Madie Reno, MD   21 mg at 07/07/21 0940  ? OLANZapine (ZYPREXA) tablet 2.5 mg  2.5 mg Oral Q6H PRN Parks Ranger, DO      ? risperiDONE (RISPERDAL) tablet 0.25 mg  0.25 mg Oral BH-[id number] Parks Ranger, DO   0.25 mg at 07/11/21 0920  ? traZODone (DESYREL) tablet 50 mg  50 mg Oral QHS PRN Clapacs, John T, MD      ? umeclidinium bromide (INCRUSE ELLIPTA) 62.5 MCG/ACT 1 puff  1 puff Inhalation Daily Benita Gutter, RPH   1 puff at 07/11/21 0919  ? ? ?Lab Results: No results found for this or any previous visit (from the past 48 hour(s)). ? ?Blood Alcohol level:  ?Lab Results  ?Component Value Date  ? ETH <10 07/04/2021  ? ? ?Metabolic Disorder Labs: ?Lab Results  ?Component Value Date  ? HGBA1C 6.2 05/24/2021  ? ?No results found for: PROLACTIN ?Lab Results  ?Component Value Date  ? CHOL 190 05/24/2021  ? TRIG 65.0 05/24/2021  ? HDL 90.80 05/24/2021  ?  CHOLHDL 2 05/24/2021  ? VLDL 13.0 05/24/2021  ? [place] 86 05/24/2021  ? [place] 82 01/09/2017  ? ? ?Physical Findings: ?AIMS:  , ,  ,  ,    ?CIWA:    ?COWS:    ? ?Musculoskeletal: ?Strength & Muscle Tone: withi

## 2021-07-11 NOTE — Plan of Care (Signed)
Patient remains alert and oriented, calm, compliant and cooperative. O2sat drop this morning to 84% and went up to 90% room air. Denies SI,HI, and AVH. Denies depression but endorsed anxiety of 5/10. Ate meals in the day room among peers with good appetite. Compliant with due medications. Ambulate around the unit with rollator walker. Patient is currently in the courtyard with staff in no apparent distress. Remain safe on the unit with Q15 minutes safety checks. ? ?Problem: Education: ?Goal: Knowledge of General Education information will improve ?Description: Including pain rating scale, medication(s)/side effects and non-pharmacologic comfort measures ?Outcome: Progressing ?  ?Problem: Health Behavior/Discharge Planning: ?Goal: Ability to manage health-related needs will improve ?Outcome: Progressing ?  ?Problem: Clinical Measurements: ?Goal: Ability to maintain clinical measurements within normal limits will improve ?Outcome: Progressing ?Goal: Will remain free from infection ?Outcome: Progressing ?Goal: Diagnostic test results will improve ?Outcome: Progressing ?Goal: Respiratory complications will improve ?Outcome: Progressing ?Goal: Cardiovascular complication will be avoided ?Outcome: Progressing ?  ?Problem: Activity: ?Goal: Risk for activity intolerance will decrease ?Outcome: Progressing ?  ?Problem: Nutrition: ?Goal: Adequate nutrition will be maintained ?Outcome: Progressing ?  ?Problem: Coping: ?Goal: Level of anxiety will decrease ?Outcome: Progressing ?  ?Problem: Elimination: ?Goal: Will not experience complications related to bowel motility ?Outcome: Progressing ?Goal: Will not experience complications related to urinary retention ?Outcome: Progressing ?  ?Problem: Pain Managment: ?Goal: General experience of comfort will improve ?Outcome: Progressing ?  ?Problem: Safety: ?Goal: Ability to remain free from injury will improve ?Outcome: Progressing ?  ?Problem: Skin Integrity: ?Goal: Risk for  impaired skin integrity will decrease ?Outcome: Progressing ?  ?Problem: Education: ?Goal: Ability to make informed decisions regarding treatment will improve ?Outcome: Progressing ?  ?Problem: Coping: ?Goal: Coping ability will improve ?Outcome: Progressing ?  ?Problem: Health Behavior/Discharge Planning: ?Goal: Identification of resources available to assist in meeting health care needs will improve ?Outcome: Progressing ?  ?Problem: Medication: ?Goal: Compliance with prescribed medication regimen will improve ?Outcome: Progressing ?  ?Problem: Self-Concept: ?Goal: Ability to disclose and discuss suicidal ideas will improve ?Outcome: Progressing ?Goal: Will verbalize positive feelings about self ?Outcome: Progressing ?  ?

## 2021-07-11 NOTE — Group Note (Signed)
LCSW Group Therapy Note ? ?Group Date: 07/11/2021 ?Start Time: 1300 ?End Time: 1400 ? ? ?Type of Therapy and Topic:  Group Therapy - How To Cope with Nervousness about Discharge  ? ?Participation Level:  Did Not Attend  ? ?Description of Group ?This process group involved identification of patients' feelings about discharge. Some of them are scheduled to be discharged soon, while others are new admissions, but each of them was asked to share thoughts and feelings surrounding discharge from the hospital. One common theme was that they are excited at the prospect of going home, while another was that many of them are apprehensive about sharing why they were hospitalized. Patients were given the opportunity to discuss these feelings with their peers in preparation for discharge. ? ?Therapeutic Goals ? ?Patient will identify their overall feelings about pending discharge. ?Patient will think about how they might proactively address issues that they believe will once again arise once they get home (i.e. with parents). ?Patients will participate in discussion about having hope for change. ? ? ?Summary of Patient Progress:   ?Patient did not attend group despite encouraged participation.  ? ? ?Therapeutic Modalities ?Cognitive Behavioral Therapy ? ? ?Durenda Hurt, LCSWA ?07/11/2021  2:37 PM   ?

## 2021-07-12 DIAGNOSIS — F332 Major depressive disorder, recurrent severe without psychotic features: Secondary | ICD-10-CM | POA: Diagnosis not present

## 2021-07-12 MED ORDER — GABAPENTIN 300 MG PO CAPS
300.0000 mg | ORAL_CAPSULE | Freq: Every day | ORAL | Status: DC
  Administered 2021-07-13 – 2021-07-15 (×3): 300 mg via ORAL
  Filled 2021-07-12 (×3): qty 1

## 2021-07-12 NOTE — BH IP Treatment Plan (Signed)
Interdisciplinary Treatment and Diagnostic Plan Update ? ?07/12/2021 ?Time of Session: 9:30AM ?Tina Bailey ?MRN: [id number] ? ?Principal Diagnosis: Severe recurrent major depression without psychotic features ([place]) ? ?Secondary Diagnoses: Principal Problem: ?  Severe recurrent major depression without psychotic features ([place]) ? ? ?Current Medications:  ?Current Facility-Administered Medications  ?Medication Dose Route Frequency Provider Last Rate Last Admin  ? acetaminophen (TYLENOL) tablet 650 mg  650 mg Oral Q6H PRN Clapacs, Madie Reno, MD   650 mg at 07/10/21 1249  ? albuterol (PROVENTIL) (2.5 MG/3ML) 0.083% nebulizer solution 2.5 mg  2.5 mg Nebulization Q4H PRN Clapacs, Madie Reno, MD      ? alum & mag hydroxide-simeth (MAALOX/MYLANTA) 200-200-20 MG/5ML suspension 30 mL  30 mL Oral Q4H PRN Clapacs, John T, MD      ? amLODipine (NORVASC) tablet 5 mg  5 mg Oral Daily Parks Ranger, DO   5 mg at 07/12/21 2355  ? aspirin-acetaminophen-caffeine (EXCEDRIN MIGRAINE) per tablet 1-2 tablet  1-2 tablet Oral Q6H PRN Clapacs, Madie Reno, MD      ? cholecalciferol (VITAMIN D3) tablet 2,000 Units  2,000 Units Oral QPC lunch Clapacs, Madie Reno, MD   2,000 Units at 07/11/21 1345  ? fluticasone furoate-vilanterol (BREO ELLIPTA) 200-25 MCG/ACT 1 puff  1 puff Inhalation Daily Benita Gutter, RPH   1 puff at 07/11/21 7322  ? gabapentin (NEURONTIN) capsule 100 mg  100 mg Oral TID Rulon Sera, MD   100 mg at 07/12/21 0254  ? HYDROcodone-acetaminophen (NORCO) 10-325 MG per tablet 1 tablet  1 tablet Oral Q6H Clapacs, Madie Reno, MD   1 tablet at 07/12/21 0933  ? LORazepam (ATIVAN) tablet 1 mg  1 mg Oral QHS PRN Parks Ranger, DO      ? magnesium hydroxide (MILK OF MAGNESIA) suspension 30 mL  30 mL Oral Daily PRN Clapacs, John T, MD      ? mirtazapine (REMERON) tablet 15 mg  15 mg Oral QHS Parks Ranger, DO   15 mg at 07/11/21 2113  ? nicotine (NICODERM CQ - dosed in mg/24 hours) patch 21 mg  21 mg Transdermal Daily  Clapacs, Madie Reno, MD   21 mg at 07/12/21 2706  ? OLANZapine (ZYPREXA) tablet 2.5 mg  2.5 mg Oral Q6H PRN Parks Ranger, DO      ? risperiDONE (RISPERDAL) tablet 0.25 mg  0.25 mg Oral BH-q8a4p Parks Ranger, DO   0.25 mg at 07/12/21 2376  ? traZODone (DESYREL) tablet 50 mg  50 mg Oral QHS PRN Clapacs, John T, MD      ? umeclidinium bromide (INCRUSE ELLIPTA) 62.5 MCG/ACT 1 puff  1 puff Inhalation Daily Benita Gutter, RPH   1 puff at 07/11/21 0919  ? ?PTA Medications: ?Medications Prior to Admission  ?Medication Sig Dispense Refill Last Dose  ? acetaminophen (TYLENOL) 325 MG tablet Take 2 tablets (650 mg total) by mouth every 6 (six) hours as needed for mild pain (or Fever >/= 101).     ? albuterol (PROVENTIL) (2.5 MG/3ML) 0.083% nebulizer solution Take 3 mLs (2.5 mg total) by nebulization every 4 (four) hours as needed for wheezing or shortness of breath. 75 mL 12   ? amLODipine (NORVASC) 2.5 MG tablet Take 1 tablet (2.5 mg total) by mouth daily. (Patient taking differently: Take 2.5 mg by mouth every morning.) 90 tablet 3   ? aspirin-acetaminophen-caffeine (EXCEDRIN MIGRAINE) 250-250-65 MG tablet Take 1-2 tablets by mouth every 6 (six) hours as needed for headache.     ?  Budeson-Glycopyrrol-Formoterol (BREZTRI AEROSPHERE) 160-9-4.8 MCG/ACT AERO Inhale 2 puffs into the lungs in the morning and at bedtime. (Patient taking differently: Inhale 2 puffs into the lungs 2 (two) times daily.) 5.9 g 0   ? busPIRone (BUSPAR) 10 MG tablet TAKE 2 TABLETS BY MOUTH 3 TIMES DAILY. (Patient taking differently: Take 20 mg by mouth 3 (three) times daily.) 540 tablet 1   ? Cholecalciferol (VITAMIN D3) 50 MCG (2000 UT) capsule Take 2,000 Units by mouth daily after lunch.     ? DULoxetine (CYMBALTA) 30 MG capsule Take 30 mg by mouth every morning.     ? HYDROcodone-acetaminophen (NORCO) 10-325 MG tablet Take 1 tablet by mouth in the morning, at noon, in the evening, and at bedtime.     ? hydrOXYzine (ATARAX) 10 MG  tablet Take 0.5-1 tablets (5-10 mg total) by mouth 3 (three) times daily as needed for anxiety. 30 tablet 0   ? LORazepam (ATIVAN) 0.5 MG tablet Take 0.5 mg by mouth at bedtime as needed for anxiety or sleep.     ? melatonin 5 MG TABS Take 5 mg by mouth at bedtime as needed (sleep).     ? omeprazole (PRILOSEC) 40 MG capsule Take 40 mg by mouth every morning.     ? Pseudoephedrine-Ibuprofen (ADVIL COLD & SINUS LIQUI-GELS PO) Take 1 capsule by mouth daily as needed (pain).     ? ? ?Patient Stressors: Health problems   ? ?Patient Strengths:   ? ?Treatment Modalities: Medication Management, Group therapy, Case management,  ?1 to 1 session with clinician, Psychoeducation, Recreational therapy. ? ? ?Physician Treatment Plan for Primary Diagnosis: Severe recurrent major depression without psychotic features ([place]) ?Long Term Goal(s): Improvement in symptoms so as ready for discharge  ? ?Short Term Goals: Ability to identify changes in lifestyle to reduce recurrence of condition will improve ?Ability to verbalize feelings will improve ?Ability to disclose and discuss suicidal ideas ?Ability to demonstrate self-control will improve ?Ability to identify and develop effective coping behaviors will improve ?Ability to maintain clinical measurements within normal limits will improve ?Compliance with prescribed medications will improve ?Ability to identify triggers associated with substance abuse/mental health issues will improve ? ?Medication Management: Evaluate patient's response, side effects, and tolerance of medication regimen. ? ?Therapeutic Interventions: 1 to 1 sessions, Unit Group sessions and Medication administration. ? ?Evaluation of Outcomes: Adequate for Discharge ? ?Physician Treatment Plan for Secondary Diagnosis: Principal Problem: ?  Severe recurrent major depression without psychotic features ([place]) ? ?Long Term Goal(s): Improvement in symptoms so as ready for discharge  ? ?Short Term Goals: Ability to identify  changes in lifestyle to reduce recurrence of condition will improve ?Ability to verbalize feelings will improve ?Ability to disclose and discuss suicidal ideas ?Ability to demonstrate self-control will improve ?Ability to identify and develop effective coping behaviors will improve ?Ability to maintain clinical measurements within normal limits will improve ?Compliance with prescribed medications will improve ?Ability to identify triggers associated with substance abuse/mental health issues will improve    ? ?Medication Management: Evaluate patient's response, side effects, and tolerance of medication regimen. ? ?Therapeutic Interventions: 1 to 1 sessions, Unit Group sessions and Medication administration. ? ?Evaluation of Outcomes: Adequate for Discharge ? ? ?RN Treatment Plan for Primary Diagnosis: Severe recurrent major depression without psychotic features ([place]) ?Long Term Goal(s): Knowledge of disease and therapeutic regimen to maintain health will improve ? ?Short Term Goals: Ability to remain free from injury will improve, Ability to verbalize frustration and anger appropriately will improve,  Ability to demonstrate self-control, Ability to participate in decision making will improve, Ability to verbalize feelings will improve, Ability to identify and develop effective coping behaviors will improve, and Compliance with prescribed medications will improve ? ?Medication Management: RN will administer medications as ordered by provider, will assess and evaluate patient's response and provide education to patient for prescribed medication. RN will report any adverse and/or side effects to prescribing provider. ? ?Therapeutic Interventions: 1 on 1 counseling sessions, Psychoeducation, Medication administration, Evaluate responses to treatment, Monitor vital signs and CBGs as ordered, Perform/monitor CIWA, COWS, AIMS and Fall Risk screenings as ordered, Perform wound care treatments as ordered. ? ?Evaluation of  Outcomes: Adequate for Discharge ? ? ?LCSW Treatment Plan for Primary Diagnosis: Severe recurrent major depression without psychotic features ([place]) ?Long Term Goal(s): Safe transition to appropriate next leve

## 2021-07-12 NOTE — Progress Notes (Signed)
[facility] MD Progress Note ? ?07/12/2021 11:46 AM ?Tina Bailey  ?MRN:  [id number] ?Subjective: Tina Bailey is still having pain and problems sleeping at night.  Her mood in the daytime has been pretty stable.  She says one of the medications is making her dizzy and believe is probably the gabapentin.  The doctor on-call decreased it over the weekend and I am just going to change to all bedtime because that is when she has the most pain and problems sleeping.  She was agreeable to that. ? ?Principal Problem: Severe recurrent major depression without psychotic features ([place]) ?Diagnosis: Principal Problem: ?  Severe recurrent major depression without psychotic features ([place]) ? ?Total Time spent with patient: 15 minutes ? ?Past Psychiatric History: None ? ?Past Medical History:  ?Past Medical History:  ?Diagnosis Date  ? Arthritis   ? DDD. Right shoulder"is frozen"-limited ROM. osteoporosis.  ? Blue toe syndrome ([place]) 11/27/2014  ? Dr. Claudia Pollock evaluating  ? CHF (congestive heart failure) ([place])   ? Complication of anesthesia   ? COPD (chronic obstructive pulmonary disease) ([place])   ? Fibromyalgia   ? Fracture of rib of right side   ? hx "osteoporosis"- states her dog nudge her on the side, next day developed great pain and was told has a fracture rib.  ? Liver cancer ([place]) 12/2014  ? Macular degeneration   ? R eye  ? Osteoporosis   ? PONV (postoperative nausea and vomiting)   ? nausea,severe vomiting after 01-13-15 portal vein embolization  ? Productive cough   ? Retina disorder   ? L eye, vision distorted, edema  ? Spine fracture due to birth trauma   ? TMJ disease   ? Wears glasses   ?  ?Past Surgical History:  ?Procedure Laterality Date  ? ANGIOPLASTY  2008  ? no stents required, no follow-up with cardiologist, no recurrent chest pain  ? CATARACT EXTRACTION, BILATERAL Bilateral   ? ESOPHAGOGASTRODUODENOSCOPY (EGD) WITH PROPOFOL N/A 06/25/2015  ? Procedure: ESOPHAGOGASTRODUODENOSCOPY (EGD) WITH PROPOFOL;  Surgeon: Milus Banister, MD;  Location: [facility];  Service: Endoscopy;  Laterality: N/A;  stent removal   ? EYE SURGERY    ? cornea surgery  ? INTRAMEDULLARY (IM) NAIL INTERTROCHANTERIC Right 12/04/2017  ? Procedure: INTRAMEDULLARY (IM) NAIL INTERTROCHANTRIC;  Surgeon: Rod Can, MD;  Location: [facility] ORS;  Service: Orthopedics;  Laterality: Right;  ? IR RADIOLOGIST EVAL & MGMT  03/29/2017  ? KIDNEY STONE SURGERY    ? LAPAROSCOPIC PARTIAL HEPATECTOMY N/A 03/04/2015  ? Procedure: DIAGNOSTIC LAPAROSCOPY, EXTENDED RIGHT HEPATECTOMY, WITH INTRAOPERATIVE ULTRASOUND;  Surgeon: Stark Klein, MD;  Location: [facility];  Service: General;  Laterality: N/A;  ? PARTIAL HYSTERECTOMY    ? portal vein embolization    ? 01-13-15 -Dr. Cathlean Sauer.  ? ?Family History:  ?Family History  ?Problem Relation Age of [place]  ? Hypertension Mother   ? Atrial fibrillation Mother   ? Heart disease Mother   ?     after age 8  ? Breast cancer Mother   ? Stroke Father   ? Dementia Father   ? Peptic Ulcer Father   ? Heart disease Brother   ?     After age 8- A-Fib  ? Heart attack Brother   ? ?Social History  ? ?Substance and Sexual Activity  ?Alcohol Use No  ? Alcohol/week: 0.0 standard drinks  ?   ?Social History  ? ?Substance and Sexual Activity  ?Drug Use No  ?  ?Social History  ? ?  Socioeconomic History  ? Marital status: Divorced  ?  Spouse name: Not on file  ? Number of children: 3  ? Years of education: Not on file  ? Highest education level: Not on file  ?Occupational History  ? Occupation: retired  ?Tobacco Use  ? Smoking status: Former  ?  Packs/day: 1.00  ?  Years: 45.00  ?  Pack years: 45.00  ?  Types: Cigarettes  ?  Quit date: 02/20/2018  ?  Years since quitting: 3.3  ? Smokeless tobacco: Never  ?Vaping Use  ? Vaping Use: Never used  ?Substance and Sexual Activity  ? Alcohol use: No  ?  Alcohol/week: 0.0 standard drinks  ? Drug use: No  ? Sexual activity: Not Currently  ?Other Topics Concern  ? Not on file  ?Social History Narrative  ? Not on  file  ? ?Social Determinants of Health  ? ?Financial Resource Strain: Not on file  ?Food Insecurity: Not on file  ?Transportation Needs: Not on file  ?Physical Activity: Not on file  ?Stress: Not on file  ?Social Connections: Not on file  ? ?Additional Social History:  ?  ?  ?  ?  ?  ?  ?  ?  ?  ?  ?  ? ?Sleep: Poor ? ?Appetite:  Good ? ?Current Medications: ?Current Facility-Administered Medications  ?Medication Dose Route Frequency Provider Last Rate Last Admin  ? acetaminophen (TYLENOL) tablet 650 mg  650 mg Oral Q6H PRN Clapacs, Madie Reno, MD   650 mg at 07/10/21 1249  ? albuterol (PROVENTIL) (2.5 MG/3ML) 0.083% nebulizer solution 2.5 mg  2.5 mg Nebulization Q4H PRN Clapacs, Madie Reno, MD      ? alum & mag hydroxide-simeth (MAALOX/MYLANTA) 200-200-20 MG/5ML suspension 30 mL  30 mL Oral Q4H PRN Clapacs, John T, MD      ? amLODipine (NORVASC) tablet 5 mg  5 mg Oral Daily Parks Ranger, DO   5 mg at 07/12/21 1610  ? aspirin-acetaminophen-caffeine (EXCEDRIN MIGRAINE) per tablet 1-2 tablet  1-2 tablet Oral Q6H PRN Clapacs, Madie Reno, MD      ? cholecalciferol (VITAMIN D3) tablet 2,000 Units  2,000 Units Oral QPC lunch Clapacs, Madie Reno, MD   2,000 Units at 07/11/21 1345  ? fluticasone furoate-vilanterol (BREO ELLIPTA) 200-25 MCG/ACT 1 puff  1 puff Inhalation Daily Benita Gutter, RPH   1 puff at 07/12/21 1051  ? [START ON 07/13/2021] gabapentin (NEURONTIN) capsule 300 mg  300 mg Oral QHS Parks Ranger, DO      ? HYDROcodone-acetaminophen [facility]) 10-325 MG per tablet 1 tablet  1 tablet Oral Q6H Clapacs, Madie Reno, MD   1 tablet at 07/12/21 0933  ? LORazepam (ATIVAN) tablet 1 mg  1 mg Oral QHS PRN Parks Ranger, DO      ? magnesium hydroxide (MILK OF MAGNESIA) suspension 30 mL  30 mL Oral Daily PRN Clapacs, John T, MD      ? mirtazapine (REMERON) tablet 15 mg  15 mg Oral QHS Parks Ranger, DO   15 mg at 07/11/21 2113  ? nicotine (NICODERM CQ - dosed in mg/24 hours) patch 21 mg  21 mg  Transdermal Daily Clapacs, Madie Reno, MD   21 mg at 07/12/21 9604  ? OLANZapine (ZYPREXA) tablet 2.5 mg  2.5 mg Oral Q6H PRN Parks Ranger, DO      ? risperiDONE (RISPERDAL) tablet 0.25 mg  0.25 mg Oral BH-[id number] Parks Ranger, DO   0.25  mg at 07/12/21 0933  ? traZODone (DESYREL) tablet 50 mg  50 mg Oral QHS PRN Clapacs, John T, MD      ? umeclidinium bromide (INCRUSE ELLIPTA) 62.5 MCG/ACT 1 puff  1 puff Inhalation Daily Benita Gutter, RPH   1 puff at 07/12/21 1052  ? ? ?Lab Results: No results found for this or any previous visit (from the past 48 hour(s)). ? ?Blood Alcohol level:  ?Lab Results  ?Component Value Date  ? ETH <10 07/04/2021  ? ? ?Metabolic Disorder Labs: ?Lab Results  ?Component Value Date  ? HGBA1C 6.2 05/24/2021  ? ?No results found for: PROLACTIN ?Lab Results  ?Component Value Date  ? CHOL 190 05/24/2021  ? TRIG 65.0 05/24/2021  ? HDL 90.80 05/24/2021  ? CHOLHDL 2 05/24/2021  ? VLDL 13.0 05/24/2021  ? [place] 86 05/24/2021  ? [place] 82 01/09/2017  ? ? ?Physical Findings: ?AIMS:  , ,  ,  ,    ?CIWA:    ?COWS:    ? ?Musculoskeletal: ?Strength & Muscle Tone: within normal limits ?Gait & Station: unsteady ?Patient leans: N/A ? ?Psychiatric Specialty Exam: ? ?Presentation  ?General Appearance: No data recorded ?Eye Contact:No data recorded ?Speech:No data recorded ?Speech Volume:No data recorded ?Handedness:No data recorded ? ?Mood and Affect  ?Mood:No data recorded ?Affect:No data recorded ? ?Thought Process  ?Thought Processes:No data recorded ?Descriptions of Associations:No data recorded ?Orientation:No data recorded ?Thought Content:No data recorded ?History of Schizophrenia/Schizoaffective disorder:No ? ?Duration of Psychotic Symptoms:No data recorded ?Hallucinations:No data recorded ?Ideas of Reference:No data recorded ?Suicidal Thoughts:No data recorded ?Homicidal Thoughts:No data recorded ? ?Sensorium  ?Memory:No data recorded ?Judgment:No data recorded ?Insight:No data  recorded ? ?Executive Functions  ?Concentration:No data recorded ?Attention Span:No data recorded ?Recall:No data recorded ?[employer] [place] recorded ?Language:No data recorded ? ?Psychomotor Activity  ?Psyc

## 2021-07-12 NOTE — [facility] Group Notes (Signed)
[facility] Group Notes:  (Nursing/MHT/Case Management/Adjunct) ? ?Date:  07/12/2021  ?Time:  10:00 AM ? ?Type of Therapy:  Psychoeducational Skills ? ?Participation Level:  Active ? ?Participation Quality:  Appropriate ? ?Affect:  Appropriate ? ?Cognitive:  Appropriate ? ?Insight:  Appropriate ? ?Engagement in Group:  Engaged ? ?Modes of Intervention:  Discussion ? ?Summary of Progress/Problems: ?The pt attended group and shared when asked during discussion. ?Juliette Alcide ?07/12/2021, 10:50 AM ?

## 2021-07-12 NOTE — Progress Notes (Signed)
Recreation Therapy Notes ? ?Date: 07/12/2021 ? ?Time: 1:30pm    ? ?Location: Craft room   ? ?Behavioral response: N/A ?  ?Intervention Topic: Social Skills   ? ?Discussion/Intervention: ?Patient refused to attend group.  ? ?Clinical Observations/Feedback:  ?Patient refused to attend group.  ?  ?Meiko Ives LRT/CTRS ? ? ? ? ? ? ? ?Luismiguel Lamere ?07/12/2021 3:37 PM ?

## 2021-07-12 NOTE — Plan of Care (Signed)
?  Problem: Education: ?Goal: Knowledge of General Education information will improve ?Description: Including pain rating scale, medication(s)/side effects and non-pharmacologic comfort measures ?Outcome: Progressing ?  ?Problem: Health Behavior/Discharge Planning: ?Goal: Ability to manage health-related needs will improve ?Outcome: Progressing ?  ?Problem: Clinical Measurements: ?Goal: Ability to maintain clinical measurements within normal limits will improve ?Outcome: Progressing ?Goal: Will remain free from infection ?Outcome: Progressing ?Goal: Diagnostic test results will improve ?Outcome: Progressing ?Goal: Respiratory complications will improve ?Outcome: Progressing ?Goal: Cardiovascular complication will be avoided ?Outcome: Progressing ?  ?Problem: Activity: ?Goal: Risk for activity intolerance will decrease ?Outcome: Progressing ?  ?Problem: Nutrition: ?Goal: Adequate nutrition will be maintained ?Outcome: Progressing ?  ?Problem: Coping: ?Goal: Level of anxiety will decrease ?Outcome: Progressing ?  ?Problem: Elimination: ?Goal: Will not experience complications related to bowel motility ?Outcome: Progressing ?Goal: Will not experience complications related to urinary retention ?Outcome: Progressing ?  ?Problem: Pain Managment: ?Goal: General experience of comfort will improve ?Outcome: Progressing ?  ?Problem: Safety: ?Goal: Ability to remain free from injury will improve ?Outcome: Progressing ?  ?Problem: Skin Integrity: ?Goal: Risk for impaired skin integrity will decrease ?Outcome: Progressing ?  ?Problem: Education: ?Goal: Ability to make informed decisions regarding treatment will improve ?Outcome: Progressing ?  ?Problem: Health Behavior/Discharge Planning: ?Goal: Identification of resources available to assist in meeting health care needs will improve ?Outcome: Progressing ?  ?Problem: Coping: ?Goal: Coping ability will improve ?Outcome: Progressing ?  ?Problem: Medication: ?Goal: Compliance with  prescribed medication regimen will improve ?Outcome: Progressing ?  ?Problem: Self-Concept: ?Goal: Ability to disclose and discuss suicidal ideas will improve ?Outcome: Progressing ?Goal: Will verbalize positive feelings about self ?Outcome: Progressing ?  ?

## 2021-07-12 NOTE — Progress Notes (Signed)
Patient remains alert and oriented, calm, compliant and cooperative. Denies SI,HI, and AVH. Denies depression but endorsed anxiety. Ate meals in the day room among peers with good appetite. Compliant with medications. Ambulates around the unit with rollator walker. Remain safe on the unit with Q15 minutes safety checks ?

## 2021-07-12 NOTE — Progress Notes (Signed)
Patient has been cooperative treatment. She denies SI,HI & AVH. She denies depression, she still reports anxiety. She was compliant with medications. No new behavioral issues to report on shift at this time. ?

## 2021-07-12 NOTE — Progress Notes (Signed)
Occupational Therapy Treatment ?Patient Details ?Name: Tina Bailey ?MRN: [id number] ?DOB: April 18, 1945 ?Today's Date: 07/12/2021 ? ? ?History of present illness Pt is a 76 y.o. female presenting to hospital 4/16 with chief complaint of suicidal intent and depression.  Seen in ED 2 days prior with presumed unintentional overdose (pt later reported it was a suicide attempt).  PMH includes chronic pain, fibromyalgia, O2 dependent, CHF, skin picking habit, TAA without rupture, COPD, and R IMN 2019 ?  ?OT comments ? Tina Bailey presents today with depression, limited endurance, and increased fatigue. She is able to engage in bed mobility, transfers, LB dressing today with Mod I. Therapist obtains key to locked courtyard, pt able to ambulate outside with RW for >20 minutes, with 3 rest breaks. Pt had recalled and completed the homework assignment she was given by therapist 4 days earlier: to come up with a list of ideas for non-pharmacological ways she might be able to use to reduce her pain. Pt's list included going to events in the community, such as music concerts; sitting on the porch at her ALF and talking with other residents and playing with their dogs; going on walks; "keeping busy;" finding some kind of "behind the scenes" ways to volunteer; "having a purpose;" and, perhaps, "going back to church." Provided educ re: how pt could make these happen post DC. Will continue to follow POC.   ? ?Recommendations for follow up therapy are one component of a multi-disciplinary discharge planning process, led by the attending physician.  Recommendations may be updated based on patient status, additional functional criteria and insurance authorization. ?   ?Follow Up Recommendations ? Other (comment) (ongoing outpt therapy for depression)  ?  ?Assistance Recommended at Discharge PRN  ?Patient can return home with the following ? Assist for transportation;Direct supervision/assist for medications management;Assistance with  cooking/housework;A little help with bathing/dressing/bathroom ?  ?Equipment Recommendations ? None recommended by OT  ?  ?Recommendations for Other Services   ? ?  ?Precautions / Restrictions Precautions ?Precautions: Fall ?Precaution Comments: Uses O2 for longer ambulation distances ?Restrictions ?Weight Bearing Restrictions: No  ? ? ?  ? ?Mobility Bed Mobility ?Overal bed mobility: Modified Independent ?Bed Mobility: Supine to Sit ?  ?  ?Supine to sit: Modified independent (Device/Increase time) ?  ?  ?General bed mobility comments: no difficulties noted ?  ? ?Transfers ?Overall transfer level: Needs assistance ?Equipment used: Rollator (4 wheels) ?Transfers: Sit to/from Stand ?Sit to Stand: Supervision ?  ?  ?  ?  ?  ?General transfer comment: steady safe transfers ?  ?  ?Balance Overall balance assessment: Needs assistance ?Sitting-balance support: No upper extremity supported, Feet supported ?Sitting balance-Leahy Scale: Good ?Sitting balance - Comments: hunched posture (which pt states is related to abdominal pain) which impacts breathing ?  ?Standing balance support: Bilateral upper extremity supported, During functional activity ?Standing balance-Leahy Scale: Good ?  ?  ?  ?  ?  ?  ?  ?  ?  ?  ?  ?  ?   ? ?ADL either performed or assessed with clinical judgement  ? ?ADL Overall ADL's : Modified independent ?  ?  ?  ?  ?  ?  ?  ?  ?  ?  ?  ?  ?  ?  ?  ?  ?  ?  ?  ?  ?  ? ?Extremity/Trunk Assessment Upper Extremity Assessment ?Upper Extremity Assessment: Overall WFL for tasks assessed ?  ?Lower Extremity Assessment ?Lower Extremity  Assessment: Overall WFL for tasks assessed ?  ?Cervical / Trunk Assessment ?Cervical / Trunk Assessment: Kyphotic ?  ? ?Vision   ?  ?  ?Perception   ?  ?Praxis   ?  ? ?Cognition Arousal/Alertness: Awake/alert ?Behavior During Therapy: [facility] for tasks assessed/performed ?Overall Cognitive Status: Within Functional Limits for tasks assessed ?  ?  ?  ?  ?  ?  ?  ?  ?  ?  ?  ?  ?  ?  ?   ?  ?  ?  ?  ?   ?Exercises Other Exercises ?Other Exercises: Therex, standing balance tolerance, non-pharmacological pain mgmt strategies ? ?  ?Shoulder Instructions   ? ? ?  ?General Comments    ? ? ?Pertinent Vitals/ Pain       Pain Assessment ?Pain Assessment: 0-10 ?Pain Score: 2  ?Faces Pain Scale: Hurts a little bit ?Pain Location: mid back, abdomen ?Pain Descriptors / Indicators: Aching ?Pain Intervention(s): Utilized relaxation techniques, Repositioned ? ?Home Living Family/patient expects to be discharged to:: Assisted living ?Living Arrangements: Other (Comment) (ALF) ?  ?  ?  ?  ?  ?  ?  ?  ?  ?  ?  ?  ?  ?Home Equipment: Rollator (4 wheels) ?  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  ? ?  ?Prior Functioning/Environment    ?  ?  ?  ?   ? ?Frequency ? Min 2X/week  ? ? ? ? ?  ?Progress Toward Goals ? ?OT Goals(current goals can now be found in the care plan section) ? Progress towards OT goals: Progressing toward goals ? ?Acute Rehab OT Goals ?Patient Stated Goal: to have things she enjoys doing ?OT Goal Formulation: With patient ?Time For Goal Achievement: 07/21/21 ?Potential to Achieve Goals: Good  ?Plan Discharge plan remains appropriate   ? ?Co-evaluation ? ? ?   ?  ?  ?  ?  ? ?  ?AM-PAC OT "6 Clicks" Daily Activity     ?Outcome Measure ? ? Help from another person eating meals?: None ?Help from another person taking care of personal grooming?: None ?Help from another person toileting, which includes using toliet, bedpan, or urinal?: None ?Help from another person bathing (including washing, rinsing, drying)?: None ?Help from another person to put on and taking off regular upper body clothing?: None ?Help from another person to put on and taking off regular lower body clothing?: None ?6 Click Score: 24 ? ?  ?End of Session Equipment Utilized During Treatment: Rollator (4 wheels) ? ?OT Visit Diagnosis: Unsteadiness on feet (R26.81);Muscle  weakness (generalized) (M62.81);Pain;Other symptoms and signs involving cognitive function ?  ?Activity Tolerance Patient tolerated treatment well ?  ?Patient Left Other (comment) (in community room) ?  ?Nurse Communication   ?  ? ?   ? ?Time: 3557-3220 ?OT Time Calculation (min): 54 min ? ?Charges: OT General Charges ?$OT Visit: 1 Visit ?OT Treatments ?$Therapeutic Activity: 38-52 mins ?$Therapeutic Exercise: 8-22 mins ? ?Josiah Lobo, PhD, MS, OTR/L ?07/12/21, 2:53 PM ? ?

## 2021-07-12 NOTE — Progress Notes (Signed)
Physical Therapy Treatment ?Patient Details ?Name: Tina Bailey ?MRN: [id number] ?DOB: September 06, 1945 ?Today's Date: 07/12/2021 ? ? ?History of Present Illness Pt is a 76 y.o. female presenting to hospital 4/16 with chief complaint of suicidal intent and depression.  Seen in ED 2 days prior with presumed unintentional overdose (pt later reported it was a suicide attempt).  PMH includes chronic pain, fibromyalgia, O2 dependent, CHF, skin picking habit, TAA without rupture, COPD, and R IMN 2019 ? ?  ?PT Comments  ? ? Pt standing by nursing station upon PT arrival; pt agreeable to PT session.  Performed sidelying and sitting ex's to improve strength (vc's required for transverse abdominal exercise technique).  Pt tolerated sessions activities fairly well.  Will continue to focus on strengthening, core strength, and independence with HEP. ?  ?Recommendations for follow up therapy are one component of a multi-disciplinary discharge planning process, led by the attending physician.  Recommendations may be updated based on patient status, additional functional criteria and insurance authorization. ? ?Follow Up Recommendations ? Outpatient PT ?  ?  ?Assistance Recommended at Discharge PRN  ?Patient can return home with the following Assistance with cooking/housework;Assist for transportation ?  ?Equipment Recommendations ? None recommended by PT  ?  ?Recommendations for Other Services   ? ? ?  ?Precautions / Restrictions Precautions ?Precautions: Fall ?Precaution Comments: Uses O2 for longer ambulation distances ?Restrictions ?Weight Bearing Restrictions: No  ?  ? ?Mobility ? Bed Mobility ?Overal bed mobility: Modified Independent ?Bed Mobility: Sidelying to Sit, Sit to Sidelying ?  ?Sidelying to sit: Modified independent (Device/Increase time) ?  ?  ?Sit to sidelying: Modified independent (Device/Increase time) ?General bed mobility comments: no difficulties noted ?  ? ?Transfers ?Overall transfer level: Modified  independent ?Equipment used: Rollator (4 wheels) ?Transfers: Sit to/from Stand ?Sit to Stand: Modified independent (Device/Increase time) ?  ?  ?  ?  ?  ?General transfer comment: steady safe transfers ?  ? ?Ambulation/Gait ?Ambulation/Gait assistance: Modified independent (Device/Increase time) ?Gait Distance (Feet): 50 Feet ?Assistive device: Rollator (4 wheels) ?  ?Gait velocity: decreased ?  ?  ?General Gait Details: steady step through gait pattern ? ? ?Stairs ?  ?  ?  ?  ?  ? ? ?Wheelchair Mobility ?  ? ?Modified Rankin (Stroke Patients Only) ?  ? ? ?  ?Balance Overall balance assessment: Modified Independent ?Sitting-balance support: No upper extremity supported, Feet supported ?Sitting balance-Leahy Scale: Normal ?Sitting balance - Comments: steady sitting reaching outside BOS ?  ?Standing balance support: Bilateral upper extremity supported, During functional activity ?Standing balance-Leahy Scale: Good ?Standing balance comment: steady ambulating with rollator use ?  ?  ?  ?  ?  ?  ?  ?  ?  ?  ?  ?  ? ?  ?Cognition Arousal/Alertness: Awake/alert ?Behavior During Therapy: Anxious ?Overall Cognitive Status: Within Functional Limits for tasks assessed ?  ?  ?  ?  ?  ?  ?  ?  ?  ?  ?  ?  ?  ?  ?  ?  ?General Comments: Verbose and tangential but very pleasant ?  ?  ? ?  ?Exercises General Exercises - Lower Extremity ?Long Arc Quad: AROM, Strengthening, Both, 10 reps, Seated ?Other Exercises ?Other Exercises: Gentle core therex in R sidelying and sitting with TA contractions x10 reps with 5 second holds ?Other Exercises: B sidelying clamshell hip strengthening ex's AROM x10 reps ? ?  ?General Comments   ?  ?  ? ?  Pertinent Vitals/Pain Pain Assessment ?Pain Assessment: Faces ?Faces Pain Scale: Hurts a little bit ?Pain Location: upper abdomen shooting to her mid back ?Pain Descriptors / Indicators: Aching ?Pain Intervention(s): Limited activity within patient's tolerance, Monitored during session, Repositioned ?O2  sats 85% post ambulation back to room on room air but O2 sats improved to 92% or greater on room air rest of session after cueing for pursed lip breathing.  ? ? ?Home Living   ?  ?  ?  ?  ?  ?  ?  ?Prior Function    ?  ?  ?   ? ?PT Goals (current goals can now be found in the care plan section) Acute Rehab PT Goals ?Patient Stated Goal: to improve pain ?PT Goal Formulation: With patient ?Time For Goal Achievement: 07/20/21 ?Potential to Achieve Goals: Fair ?Progress towards PT goals: Progressing toward goals ? ?  ?Frequency ? ? ? Min 2X/week ? ? ? ?  ?PT Plan Current plan remains appropriate  ? ? ?Co-evaluation   ?  ?  ?  ?  ? ?  ?AM-PAC PT "6 Clicks" Mobility   ?Outcome Measure ? Help needed turning from your back to your side while in a flat bed without using bedrails?: None ?Help needed moving from lying on your back to sitting on the side of a flat bed without using bedrails?: None ?Help needed moving to and from a bed to a chair (including a wheelchair)?: None ?Help needed standing up from a chair using your arms (e.g., wheelchair or bedside chair)?: None ?Help needed to walk in hospital room?: None ?Help needed climbing 3-5 steps with a railing? : None ?6 Click Score: 24 ? ?  ?End of Session   ?Activity Tolerance: Patient tolerated treatment well ?Patient left: in bed ?Nurse Communication: Mobility status ?PT Visit Diagnosis: Pain;Muscle weakness (generalized) (M62.81) ?  ? ? ?Time: 4627-0350 ?PT Time Calculation (min) (ACUTE ONLY): 26 min ? ?Charges:  $Therapeutic Exercise: 23-37 mins          ?          ?Leitha Bleak, PT ?07/12/21, 5:07 PM ? ? ?

## 2021-07-13 DIAGNOSIS — F332 Major depressive disorder, recurrent severe without psychotic features: Secondary | ICD-10-CM | POA: Diagnosis not present

## 2021-07-13 MED ORDER — FLUOXETINE HCL 10 MG PO CAPS
10.0000 mg | ORAL_CAPSULE | Freq: Every day | ORAL | Status: DC
  Administered 2021-07-13 – 2021-07-14 (×2): 10 mg via ORAL
  Filled 2021-07-13 (×2): qty 1

## 2021-07-13 NOTE — Progress Notes (Signed)
Physical Therapy Treatment ?Patient Details ?Name: Tina Bailey ?MRN: [id number] ?DOB: 1945/08/20 ?Today's Date: 07/13/2021 ? ? ?History of Present Illness Pt is a 76 y.o. female presenting to hospital 4/16 with chief complaint of suicidal intent and depression.  Seen in ED 2 days prior with presumed unintentional overdose (pt later reported it was a suicide attempt).  PMH includes chronic pain, fibromyalgia, O2 dependent, CHF, skin picking habit, TAA without rupture, COPD, and R IMN 2019 ? ?  ?PT Comments  ? ? Pt resting in bed upon PT arrival; agreeable to PT session.  Pt issued side lying transverse abdominal (TA) contraction written HEP (x10 reps 3x's/day)--improved contraction and technique noted today with less cueing required.  Limited written issued HEP exercises d/t pt appearing to get overwhelmed with details.  Also performed additional ex's (see below for details) for strengthening.  Will monitor pt's progress with HEP and adjust/progress as appropriate (anticipate 1-2 more sessions). ?  ?Recommendations for follow up therapy are one component of a multi-disciplinary discharge planning process, led by the attending physician.  Recommendations may be updated based on patient status, additional functional criteria and insurance authorization. ? ?Follow Up Recommendations ? Outpatient PT ?  ?  ?Assistance Recommended at Discharge PRN  ?Patient can return home with the following Assistance with cooking/housework;Assist for transportation ?  ?Equipment Recommendations ? None recommended by PT  ?  ?Recommendations for Other Services   ? ? ?  ?Precautions / Restrictions Precautions ?Precautions: Fall ?Precaution Comments: Uses O2 for longer ambulation distances ?Restrictions ?Weight Bearing Restrictions: No  ?  ? ?Mobility ? Bed Mobility ?Overal bed mobility: Modified Independent ?Bed Mobility: Sidelying to Sit, Sit to Sidelying ?  ?Sidelying to sit: Modified independent (Device/Increase time) ?  ?  ?Sit to  sidelying: Modified independent (Device/Increase time) ?General bed mobility comments: no difficulties noted ?  ? ?Transfers ?  ?Equipment used: Rollator (4 wheels) ?Transfers: Sit to/from Stand ?Sit to Stand: Modified independent (Device/Increase time) ?  ?  ?  ?  ?  ?General transfer comment: steady safe transfers ?  ? ?Ambulation/Gait ?Ambulation/Gait assistance:  (Deferred gait (focus on ex's during session)) ?  ?  ?  ?  ?  ?  ?  ? ? ?Stairs ?  ?  ?  ?  ?  ? ? ?Wheelchair Mobility ?  ? ?Modified Rankin (Stroke Patients Only) ?  ? ? ?  ?Balance Overall balance assessment: Modified Independent ?Sitting-balance support: No upper extremity supported, Feet supported ?Sitting balance-Leahy Scale: Normal ?Sitting balance - Comments: steady sitting reaching outside BOS ?  ?Standing balance support: No upper extremity supported ?Standing balance-Leahy Scale: Good ?Standing balance comment: steady standing reaching within BOS ?  ?  ?  ?  ?  ?  ?  ?  ?  ?  ?  ?  ? ?  ?Cognition Arousal/Alertness: Awake/alert ?Behavior During Therapy: Anxious ?Overall Cognitive Status: Within Functional Limits for tasks assessed ?  ?  ?  ?  ?  ?  ?  ?  ?  ?  ?  ?  ?  ?  ?  ?  ?General Comments: Verbose and tangential but very pleasant ?  ?  ? ?  ?Exercises General Exercises - Lower Extremity ?Long Arc Quad: AROM, Strengthening, Both, 10 reps, Seated ?Other Exercises ?Other Exercises: Gentle core therex in L sidelying and sitting with TA contractions x10 reps with 5 second holds ?Other Exercises: B sidelying clamshell hip strengthening ex's AROM x10 reps ? ?  ?  General Comments   ?  ?  ? ?Pertinent Vitals/Pain Pain Assessment ?Pain Assessment: 0-10 ?Pain Score: 4  ?Pain Location: upper abdomen shooting to her mid back ?Pain Descriptors / Indicators: Aching ?Pain Intervention(s): Limited activity within patient's tolerance, Monitored during session, Repositioned  ? ? ?Home Living   ?  ?  ?  ?  ?  ?  ?  ?  ?  ?   ?  ?Prior Function    ?  ?  ?    ? ?PT Goals (current goals can now be found in the care plan section) Acute Rehab PT Goals ?Patient Stated Goal: to improve pain ?PT Goal Formulation: With patient ?Time For Goal Achievement: 07/20/21 ?Potential to Achieve Goals: Fair ?Progress towards PT goals: Progressing toward goals ? ?  ?Frequency ? ? ? Min 2X/week ? ? ? ?  ?PT Plan Current plan remains appropriate  ? ? ?Co-evaluation   ?  ?  ?  ?  ? ?  ?AM-PAC PT "6 Clicks" Mobility   ?Outcome Measure ? Help needed turning from your back to your side while in a flat bed without using bedrails?: None ?Help needed moving from lying on your back to sitting on the side of a flat bed without using bedrails?: None ?Help needed moving to and from a bed to a chair (including a wheelchair)?: None ?Help needed standing up from a chair using your arms (e.g., wheelchair or bedside chair)?: None ?Help needed to walk in hospital room?: None ?Help needed climbing 3-5 steps with a railing? : None ?6 Click Score: 24 ? ?  ?End of Session   ?Activity Tolerance: Patient tolerated treatment well ?Patient left: in chair ?  ?PT Visit Diagnosis: Pain;Muscle weakness (generalized) (M62.81) ?  ? ? ?Time: 7867-6720 ?PT Time Calculation (min) (ACUTE ONLY): 20 min ? ?Charges:  $Therapeutic Exercise: 8-22 mins          ?          ? ?Leitha Bleak, PT ?07/13/21, 4:42 PM ? ? ?

## 2021-07-13 NOTE — Progress Notes (Addendum)
Pt presents with logical / soft speech, fair eye contact, ambulatory with with slow / unsteady gait. Observed to be tearful this morning on initial contact. Denies SI, HI and AVH. Reports depression 5/10 and anxiety 0/10 with current stressors being meeting her d/c needs "I don't know where to go. I don't what to do when I leave here". Emotional support and encouragement provided to pt. Safety checks and falls precaution maintained without incident. Verbal education provided on all medications and effects monitored. Treatment team made aware. Pt started on Prozac 10 mg daily this shift. Pt tolerates all medications, meals and fluids without discomfort. Pt remains safe in milieu.  ?

## 2021-07-13 NOTE — Plan of Care (Signed)
Patient Calm and Cooperative during assessment. Pt did verbalize Suicide Attempt as reason for hospitalization.  Pt did state "In my daughters words I was non- responsive". Maybe I took about 8-10 pills because of the pain.  Patient denies SI/HI and contracts for safety.  Patient did verbalize 8/10 anxiety/depression.  Patient verbalized attending therapy but she got emotional "I think I just got frustrated and had trouble". Emotional support and education given. Patient did verbalize restful sleep. Pt did eat hs snack. Q 15 minute safety check in place.  Informed pt to notify staff if needs arise.   ?

## 2021-07-13 NOTE — Progress Notes (Signed)
[facility] MD Progress Note ? ?07/13/2021 11:44 AM ?Tina Bailey  ?MRN:  [id number] ?Subjective: Tina Bailey is seen on rounds today.  The nurses tell me that she has had crying episodes.  She is very anxious about leaving because she does not know what is going to happen to her with her chronic pain.  She denies any suicidal ideation.  She admits to being depressed.  She is okay with starting a new medication that might help. ? ?Principal Problem: Severe recurrent major depression without psychotic features ([place]) ?Diagnosis: Principal Problem: ?  Severe recurrent major depression without psychotic features ([place]) ? ?Total Time spent with patient: 15 minutes ? ?Past Psychiatric History: None ? ?Past Medical History: Hepatocellular carcinoma, Chronic compression fracture and multiple fractures and body pain  ?Past Medical History:  ?Diagnosis Date  ? Arthritis   ? DDD. Right shoulder"is frozen"-limited ROM. osteoporosis.  ? Blue toe syndrome ([place]) 11/27/2014  ? Dr. Claudia Pollock evaluating  ? CHF (congestive heart failure) ([place])   ? Complication of anesthesia   ? COPD (chronic obstructive pulmonary disease) ([place])   ? Fibromyalgia   ? Fracture of rib of right side   ? hx "osteoporosis"- states her dog nudge her on the side, next day developed great pain and was told has a fracture rib.  ? Liver cancer ([place]) 12/2014  ? Macular degeneration   ? R eye  ? Osteoporosis   ? PONV (postoperative nausea and vomiting)   ? nausea,severe vomiting after 01-13-15 portal vein embolization  ? Productive cough   ? Retina disorder   ? L eye, vision distorted, edema  ? Spine fracture due to birth trauma   ? TMJ disease   ? Wears glasses   ?  ?Past Surgical History:  ?Procedure Laterality Date  ? ANGIOPLASTY  2008  ? no stents required, no follow-up with cardiologist, no recurrent chest pain  ? CATARACT EXTRACTION, BILATERAL Bilateral   ? ESOPHAGOGASTRODUODENOSCOPY (EGD) WITH PROPOFOL N/A 06/25/2015  ? Procedure: ESOPHAGOGASTRODUODENOSCOPY (EGD) WITH  PROPOFOL;  Surgeon: Milus Banister, MD;  Location: [facility];  Service: Endoscopy;  Laterality: N/A;  stent removal   ? EYE SURGERY    ? cornea surgery  ? INTRAMEDULLARY (IM) NAIL INTERTROCHANTERIC Right 12/04/2017  ? Procedure: INTRAMEDULLARY (IM) NAIL INTERTROCHANTRIC;  Surgeon: Rod Can, MD;  Location: [facility];  Service: Orthopedics;  Laterality: Right;  ? IR RADIOLOGIST EVAL & MGMT  03/29/2017  ? KIDNEY STONE SURGERY    ? LAPAROSCOPIC PARTIAL HEPATECTOMY N/A 03/04/2015  ? Procedure: DIAGNOSTIC LAPAROSCOPY, EXTENDED RIGHT HEPATECTOMY, WITH INTRAOPERATIVE ULTRASOUND;  Surgeon: Stark Klein, MD;  Location: [facility];  Service: General;  Laterality: N/A;  ? PARTIAL HYSTERECTOMY    ? portal vein embolization    ? 01-13-15 -Dr. Cathlean Sauer.  ? ?Family History:  ?Family History  ?Problem Relation Age of Onset  ? Hypertension Mother   ? Atrial fibrillation Mother   ? Heart disease Mother   ?     after age 71  ? Breast cancer Mother   ? Stroke Father   ? Dementia Father   ? Peptic Ulcer Father   ? Heart disease Brother   ?     After age 28- A-Fib  ? Heart attack Brother   ? ? ?Social History:  ?Social History  ? ?Substance and Sexual Activity  ?Alcohol Use No  ? Alcohol/week: 0.0 standard drinks  ?   ?Social History  ? ?Substance and Sexual Activity  ?Drug Use No  ?  ?  Social History  ? ?Socioeconomic History  ? Marital status: Divorced  ?  Spouse name: Not on file  ? Number of children: 3  ? Years of education: Not on file  ? Highest education level: Not on file  ?Occupational History  ? Occupation: retired  ?Tobacco Use  ? Smoking status: Former  ?  Packs/day: 1.00  ?  Years: 45.00  ?  Pack years: 45.00  ?  Types: Cigarettes  ?  Quit date: 02/20/2018  ?  Years since quitting: 3.3  ? Smokeless tobacco: Never  ?Vaping Use  ? Vaping Use: Never used  ?Substance and Sexual Activity  ? Alcohol use: No  ?  Alcohol/week: 0.0 standard drinks  ? Drug use: No  ? Sexual activity: Not Currently  ?Other Topics Concern  ?  Not on file  ?Social History Narrative  ? Not on file  ? ?Social Determinants of Health  ? ?Financial Resource Strain: Not on file  ?Food Insecurity: Not on file  ?Transportation Needs: Not on file  ?Physical Activity: Not on file  ?Stress: Not on file  ?Social Connections: Not on file  ? ?Additional Social History:  ?  ?  ?  ?  ?  ?  ?  ?  ?  ?  ?  ? ?Sleep: Poor ? ?Appetite:  Fair ? ?Current Medications: ?Current Facility-Administered Medications  ?Medication Dose Route Frequency Provider Last Rate Last Admin  ? acetaminophen (TYLENOL) tablet 650 mg  650 mg Oral Q6H PRN Clapacs, Madie Reno, MD   650 mg at 07/12/21 1215  ? albuterol (PROVENTIL) (2.5 MG/3ML) 0.083% nebulizer solution 2.5 mg  2.5 mg Nebulization Q4H PRN Clapacs, Madie Reno, MD      ? alum & mag hydroxide-simeth (MAALOX/MYLANTA) 200-200-20 MG/5ML suspension 30 mL  30 mL Oral Q4H PRN Clapacs, John T, MD      ? amLODipine (NORVASC) tablet 5 mg  5 mg Oral Daily Parks Ranger, DO   5 mg at 07/12/21 3976  ? aspirin-acetaminophen-caffeine (EXCEDRIN MIGRAINE) per tablet 1-2 tablet  1-2 tablet Oral Q6H PRN Clapacs, Madie Reno, MD      ? cholecalciferol (VITAMIN D3) tablet 2,000 Units  2,000 Units Oral QPC lunch Clapacs, Madie Reno, MD   2,000 Units at 07/12/21 1535  ? FLUoxetine (PROZAC) capsule 10 mg  10 mg Oral Daily Parks Ranger, DO   10 mg at 07/13/21 1059  ? fluticasone furoate-vilanterol (BREO ELLIPTA) 200-25 MCG/ACT 1 puff  1 puff Inhalation Daily Benita Gutter, RPH   1 puff at 07/13/21 0941  ? gabapentin (NEURONTIN) capsule 300 mg  300 mg Oral QHS Parks Ranger, DO      ? HYDROcodone-acetaminophen [facility]) 10-325 MG per tablet 1 tablet  1 tablet Oral Q6H Clapacs, Madie Reno, MD   1 tablet at 07/13/21 [phone]  ? LORazepam (ATIVAN) tablet 1 mg  1 mg Oral QHS PRN Parks Ranger, DO      ? magnesium hydroxide (MILK OF MAGNESIA) suspension 30 mL  30 mL Oral Daily PRN Clapacs, Madie Reno, MD      ? mirtazapine (REMERON) tablet 15 mg  15 mg  Oral QHS Parks Ranger, DO   15 mg at 07/12/21 2128  ? OLANZapine (ZYPREXA) tablet 2.5 mg  2.5 mg Oral Q6H PRN Parks Ranger, DO      ? risperiDONE (RISPERDAL) tablet 0.25 mg  0.25 mg Oral BH-[id number] Parks Ranger, DO   0.25 mg at 07/13/21 9379  ?  traZODone (DESYREL) tablet 50 mg  50 mg Oral QHS PRN Clapacs, John T, MD      ? umeclidinium bromide (INCRUSE ELLIPTA) 62.5 MCG/ACT 1 puff  1 puff Inhalation Daily Benita Gutter, RPH   1 puff at 07/13/21 0940  ? ? ?Lab Results: No results found for this or any previous visit (from the past 48 hour(s)). ? ?Blood Alcohol level:  ?Lab Results  ?Component Value Date  ? ETH <10 07/04/2021  ? ? ?Metabolic Disorder Labs: ?Lab Results  ?Component Value Date  ? HGBA1C 6.2 05/24/2021  ? ?No results found for: PROLACTIN ?Lab Results  ?Component Value Date  ? CHOL 190 05/24/2021  ? TRIG 65.0 05/24/2021  ? HDL 90.80 05/24/2021  ? CHOLHDL 2 05/24/2021  ? VLDL 13.0 05/24/2021  ? [place] 86 05/24/2021  ? [place] 82 01/09/2017  ? ? ?Physical Findings: ?AIMS:  , ,  ,  ,    ?CIWA:    ?COWS:    ? ?Musculoskeletal: ?Strength & Muscle Tone: within normal limits ?Gait & Station: unsteady ?Patient leans: N/A ? ?Psychiatric Specialty Exam: ? ?Presentation  ?General Appearance: No data recorded ?Eye Contact:No data recorded ?Speech:No data recorded ?Speech Volume:No data recorded ?Handedness:No data recorded ? ?Mood and Affect  ?Mood:No data recorded ?Affect:No data recorded ? ?Thought Process  ?Thought Processes:No data recorded ?Descriptions of Associations:No data recorded ?Orientation:No data recorded ?Thought Content:No data recorded ?History of Schizophrenia/Schizoaffective disorder:No ? ?Duration of Psychotic Symptoms:No data recorded ?Hallucinations:No data recorded ?Ideas of Reference:No data recorded ?Suicidal Thoughts:No data recorded ?Homicidal Thoughts:No data recorded ? ?Sensorium  ?Memory:No data recorded ?Judgment:No data recorded ?Insight:No data  recorded ? ?Executive Functions  ?Concentration:No data recorded ?Attention Span:No data recorded ?Recall:No data recorded ?[employer] [place] recorded ?Language:No data recorded ? ?Psychomotor Activity

## 2021-07-13 NOTE — BHH Counselor (Signed)
CSW contacted pt's assisted living facility, [facility], [phone] regarding discharge. Staff member stated that pt can return back to facility and that they could provide transportation as long as pt can climb into the bus. CSW will follow up with pt regarding ability. CSW will follow up with pt's family members about providing transportation as well in case the facility bus is not an option.  ? ?Zaydrian Batta [place], MSW, LCSW-A ?4/25/20232:22 PM  ?

## 2021-07-13 NOTE — BHH Group Notes (Signed)
[facility] Group Notes:  (Nursing/MHT/Case Management/Adjunct) ? ?Date:  07/13/2021  ?Time:  10:00 AM ? ?Type of Therapy:  Psychoeducational Skills ? ?Participation Level:  Active ? ?Participation Quality:  Appropriate and Attentive ? ?Affect:  Appropriate ? ?Cognitive:  Appropriate ? ?Insight:  Appropriate ? ?Engagement in Group:  Engaged ? ?Modes of Intervention:  Discussion ? ?Summary of Progress/Problems: ?The pt attended group and was very attentive. The pt participated in group discussion. ?Juliette Alcide ?07/13/2021, 11:37 AM ?

## 2021-07-14 DIAGNOSIS — F332 Major depressive disorder, recurrent severe without psychotic features: Secondary | ICD-10-CM | POA: Diagnosis not present

## 2021-07-14 MED ORDER — FLUOXETINE HCL 20 MG PO CAPS
20.0000 mg | ORAL_CAPSULE | Freq: Every day | ORAL | Status: DC
  Administered 2021-07-15 – 2021-07-16 (×2): 20 mg via ORAL
  Filled 2021-07-14 (×2): qty 1

## 2021-07-14 NOTE — Progress Notes (Signed)
[facility] MD Progress Note ? ?07/14/2021 12:04 PM ?Tina Bailey  ?MRN:  [id number] ?Subjective: Manju continues to complain of anxiety and depression although her affect has improved.  I started her on Prozac yesterday at 10 mg/day and she is tolerating it.  I medical up on it today.  She denies any suicidal ideation.  Overall, I think she is improved but I think she is looking at higher expectations. ? ?Principal Problem: Severe recurrent major depression without psychotic features ([place]) ?Diagnosis: Principal Problem: ?  Severe recurrent major depression without psychotic features ([place]) ? ?Total Time spent with patient: 15 minutes ? ?Past Psychiatric History: None ? ?Past Medical History:  ?Past Medical History:  ?Diagnosis Date  ? Arthritis   ? DDD. Right shoulder"is frozen"-limited ROM. osteoporosis.  ? Blue toe syndrome ([place]) 11/27/2014  ? Dr. Claudia Pollock evaluating  ? CHF (congestive heart failure) ([place])   ? Complication of anesthesia   ? COPD (chronic obstructive pulmonary disease) ([place])   ? Fibromyalgia   ? Fracture of rib of right side   ? hx "osteoporosis"- states her dog nudge her on the side, next day developed great pain and was told has a fracture rib.  ? Liver cancer ([place]) 12/2014  ? Macular degeneration   ? R eye  ? Osteoporosis   ? PONV (postoperative nausea and vomiting)   ? nausea,severe vomiting after 01-13-15 portal vein embolization  ? Productive cough   ? Retina disorder   ? L eye, vision distorted, edema  ? Spine fracture due to birth trauma   ? TMJ disease   ? Wears glasses   ?  ?Past Surgical History:  ?Procedure Laterality Date  ? ANGIOPLASTY  2008  ? no stents required, no follow-up with cardiologist, no recurrent chest pain  ? CATARACT EXTRACTION, BILATERAL Bilateral   ? ESOPHAGOGASTRODUODENOSCOPY (EGD) WITH PROPOFOL N/A 06/25/2015  ? Procedure: ESOPHAGOGASTRODUODENOSCOPY (EGD) WITH PROPOFOL;  Surgeon: Milus Banister, MD;  [facility];  Service: Endoscopy;  Laterality: N/A;  stent  removal   ? EYE SURGERY    ? cornea surgery  ? INTRAMEDULLARY (IM) NAIL INTERTROCHANTERIC Right 12/04/2017  ? Procedure: INTRAMEDULLARY (IM) NAIL INTERTROCHANTRIC;  Surgeon: Rod Can, MD;  [facility];  Service: Orthopedics;  Laterality: Right;  ? IR RADIOLOGIST EVAL & MGMT  03/29/2017  ? KIDNEY STONE SURGERY    ? LAPAROSCOPIC PARTIAL HEPATECTOMY N/A 03/04/2015  ? Procedure: DIAGNOSTIC LAPAROSCOPY, EXTENDED RIGHT HEPATECTOMY, WITH INTRAOPERATIVE ULTRASOUND;  Surgeon: Stark Klein, MD;  [facility];  Service: General;  Laterality: N/A;  ? PARTIAL HYSTERECTOMY    ? portal vein embolization    ? 01-13-15 -Dr. Cathlean Sauer.  ? ?Family History:  ?Family History  ?Problem Relation Age of Onset  ? Hypertension Mother   ? Atrial fibrillation Mother   ? Heart disease Mother   ?     after age 47  ? Breast cancer Mother   ? Stroke Father   ? Dementia Father   ? Peptic Ulcer Father   ? Heart disease Brother   ?     After age 25- A-Fib  ? Heart attack Brother   ? ? ?Social History:  ?Social History  ? ?Substance and Sexual Activity  ?Alcohol Use No  ? Alcohol/week: 0.0 standard drinks  ?   ?Social History  ? ?Substance and Sexual Activity  ?Drug Use No  ?  ?Social History  ? ?Socioeconomic History  ? Marital status: Divorced  ?  Spouse name: Not  on file  ? Number of children: 3  ? Years of education: Not on file  ? Highest education level: Not on file  ?Occupational History  ? Occupation: retired  ?Tobacco Use  ? Smoking status: Former  ?  Packs/day: 1.00  ?  Years: 45.00  ?  Pack years: 45.00  ?  Types: Cigarettes  ?  Quit date: 02/20/2018  ?  Years since quitting: 3.3  ? Smokeless tobacco: Never  ?Vaping Use  ? Vaping Use: Never used  ?Substance and Sexual Activity  ? Alcohol use: No  ?  Alcohol/week: 0.0 standard drinks  ? Drug use: No  ? Sexual activity: Not Currently  ?Other Topics Concern  ? Not on file  ?Social History Narrative  ? Not on file  ? ?Social Determinants of Health  ? ?Financial Resource  Strain: Not on file  ?Food Insecurity: Not on file  ?Transportation Needs: Not on file  ?Physical Activity: Not on file  ?Stress: Not on file  ?Social Connections: Not on file  ? ?Additional Social History:  ?  ?  ?  ?  ?  ?  ?  ?  ?  ?  ?  ? ?Sleep: Fair ? ?Appetite:  Good ? ?Current Medications: ?Current Facility-Administered Medications  ?Medication Dose Route Frequency Provider Last Rate Last Admin  ? acetaminophen (TYLENOL) tablet 650 mg  650 mg Oral Q6H PRN Clapacs, Madie Reno, MD   650 mg at 07/12/21 1215  ? albuterol (PROVENTIL) (2.5 MG/3ML) 0.083% nebulizer solution 2.5 mg  2.5 mg Nebulization Q4H PRN Clapacs, Madie Reno, MD      ? alum & mag hydroxide-simeth (MAALOX/MYLANTA) 200-200-20 MG/5ML suspension 30 mL  30 mL Oral Q4H PRN Clapacs, John T, MD      ? amLODipine (NORVASC) tablet 5 mg  5 mg Oral Daily Parks Ranger, DO   5 mg at 07/14/21 4010  ? aspirin-acetaminophen-caffeine (EXCEDRIN MIGRAINE) per tablet 1-2 tablet  1-2 tablet Oral Q6H PRN Clapacs, Madie Reno, MD      ? cholecalciferol (VITAMIN D3) tablet 2,000 Units  2,000 Units Oral QPC lunch Clapacs, Madie Reno, MD   2,000 Units at 07/13/21 1316  ? [START ON 07/15/2021] FLUoxetine (PROZAC) capsule 20 mg  20 mg Oral Daily Parks Ranger, DO      ? fluticasone furoate-vilanterol (BREO ELLIPTA) 200-25 MCG/ACT 1 puff  1 puff Inhalation Daily Benita Gutter, RPH   1 puff at 07/14/21 2725  ? gabapentin (NEURONTIN) capsule 300 mg  300 mg Oral QHS Parks Ranger, DO   300 mg at 07/13/21 2136  ? HYDROcodone-acetaminophen (NORCO) 10-325 MG per tablet 1 tablet  1 tablet Oral Q6H Clapacs, Madie Reno, MD   1 tablet at 07/14/21 0912  ? LORazepam (ATIVAN) tablet 1 mg  1 mg Oral QHS PRN Parks Ranger, DO      ? magnesium hydroxide (MILK OF MAGNESIA) suspension 30 mL  30 mL Oral Daily PRN Clapacs, Madie Reno, MD      ? mirtazapine (REMERON) tablet 15 mg  15 mg Oral QHS Parks Ranger, DO   15 mg at 07/13/21 2141  ? OLANZapine (ZYPREXA)  tablet 2.5 mg  2.5 mg Oral Q6H PRN Parks Ranger, DO      ? risperiDONE (RISPERDAL) tablet 0.25 mg  0.25 mg Oral BH-[id number] Parks Ranger, DO   0.25 mg at 07/14/21 3664  ? traZODone (DESYREL) tablet 50 mg  50 mg Oral QHS PRN Clapacs, John  T, MD      ? umeclidinium bromide (INCRUSE ELLIPTA) 62.5 MCG/ACT 1 puff  1 puff Inhalation Daily Benita Gutter, RPH   1 puff at 07/14/21 0815  ? ? ?Lab Results: No results found for this or any previous visit (from the past 48 hour(s)). ? ?Blood Alcohol level:  ?Lab Results  ?Component Value Date  ? ETH <10 07/04/2021  ? ? ?Metabolic Disorder Labs: ?Lab Results  ?Component Value Date  ? HGBA1C 6.2 05/24/2021  ? ?No results found for: PROLACTIN ?Lab Results  ?Component Value Date  ? CHOL 190 05/24/2021  ? TRIG 65.0 05/24/2021  ? HDL 90.80 05/24/2021  ? CHOLHDL 2 05/24/2021  ? VLDL 13.0 05/24/2021  ? [place] 86 05/24/2021  ? [place] 82 01/09/2017  ? ? ?Physical Findings: ?AIMS: Facial and Oral Movements ?Muscles of Facial Expression: None, normal ?Lips and Perioral Area: None, normal ?Jaw: None, normal ?Tongue: None, normal,Extremity Movements ?Upper (arms, wrists, hands, fingers): None, normal ?Lower (legs, knees, ankles, toes): None, normal, Trunk Movements ?Neck, shoulders, hips: None, normal, Overall Severity ?Severity of abnormal movements (highest score from questions above): None, normal ?Incapacitation due to abnormal movements: None, normal ?Patient's awareness of abnormal movements (rate only patient's report): No Awareness, Dental Status ?Current problems with teeth and/or dentures?: No ?Does patient usually wear dentures?: No  ?CIWA:    ?COWS:    ? ?Musculoskeletal: ?Strength & Muscle Tone: within normal limits ?Gait & Station: normal ?Patient leans: N/A ? ?Psychiatric Specialty Exam: ? ?Presentation  ?General Appearance: No data recorded ?Eye Contact:No data recorded ?Speech:No data recorded ?Speech Volume:No data recorded ?Handedness:No data  recorded ? ?Mood and Affect  ?Mood:No data recorded ?Affect:No data recorded ? ?Thought Process  ?Thought Processes:No data recorded ?Descriptions of Associations:No data recorded ?Orientation:No data recorded ?Thoug

## 2021-07-14 NOTE — Progress Notes (Signed)
Occupational Therapy Treatment ?Patient Details ?Name: EMMOGENE SIMSON ?MRN: [id number] ?DOB: 1945-05-27 ?Today's Date: 07/14/2021 ? ? ?History of present illness Pt is a 76 y.o. female presenting to hospital 4/16 with chief complaint of suicidal intent and depression.  Seen in ED 2 days prior with presumed unintentional overdose (pt later reported it was a suicide attempt).  PMH includes chronic pain, fibromyalgia, O2 dependent, CHF, skin picking habit, TAA without rupture, COPD, and R IMN 2019 ?  ?OT comments ? Ms. Ventura presents today much more animated and cheerful than during previous sessions. She states that she has heard she will discharge home on Friday, and that she is looking forward to that, has increased confidence in her ability to manage her pain, has some exercises she thinks will be helpful, and has resolved to develop a regular schedule at her ALF, signing up for activities and interacting more readily with other residents. She states there are 2 seated exercises classes offered each week at her facility, and that she intends to participate in those. She expresses understanding that exercise, routine, and social interaction will assist with mental and physical health as well as stress and pain management. Provided ongoing education re: stress/pain mgmt strategies, with pt verbalizing understanding and providing teach-back. Pt states she believes her medications are better adjusted at present, causing less drowsiness/wooziness and better helping pain and depression.  ? ?Recommendations for follow up therapy are one component of a multi-disciplinary discharge planning process, led by the attending physician.  Recommendations may be updated based on patient status, additional functional criteria and insurance authorization. ?   ?Follow Up Recommendations ? Other (comment) (ongoing outpt therapy for depression/anxiety)  ?  ?Assistance Recommended at Discharge    ?Patient can return home with the  following ? Assist for transportation;Direct supervision/assist for medications management;Assistance with cooking/housework;A little help with bathing/dressing/bathroom ?  ?Equipment Recommendations ? None recommended by OT  ?  ?Recommendations for Other Services   ? ?  ?Precautions / Restrictions Precautions ?Precautions: Fall ?Restrictions ?Weight Bearing Restrictions: No  ? ? ?  ? ?Mobility Bed Mobility ?  ?  ?  ?  ?  ?  ?  ?General bed mobility comments: received/left ambulating with rollator ?  ? ?Transfers ?Overall transfer level: Modified independent ?Equipment used: Rollator (4 wheels) ?  ?  ?  ?  ?  ?  ?  ?  ?  ?  ?Balance   ?Sitting-balance support: No upper extremity supported, Feet supported ?Sitting balance-Leahy Scale: Normal ?Sitting balance - Comments: steady sitting reaching outside BOS ?  ?Standing balance support: Bilateral upper extremity supported ?Standing balance-Leahy Scale: Good ?Standing balance comment: steady standing reaching within BOS ?  ?  ?  ?  ?  ?  ?  ?  ?  ?  ?  ?   ? ?ADL either performed or assessed with clinical judgement  ? ?ADL   ?  ?  ?  ?  ?  ?  ?  ?  ?  ?  ?  ?  ?  ?  ?  ?  ?  ?  ?  ?  ?  ? ?Extremity/Trunk Assessment Upper Extremity Assessment ?Upper Extremity Assessment: Overall WFL for tasks assessed ?  ?Lower Extremity Assessment ?Lower Extremity Assessment: Overall WFL for tasks assessed ?  ?  ?  ? ?Vision   ?  ?  ?Perception   ?  ?Praxis   ?  ? ?Cognition Arousal/Alertness: Awake/alert ?Behavior During Therapy: Anxious ?  Overall Cognitive Status: Within Functional Limits for tasks assessed ?  ?  ?  ?  ?  ?  ?  ?  ?  ?  ?  ?  ?  ?  ?  ?  ?  ?  ?  ?   ?Exercises Other Exercises ?Other Exercises: Therex, standing balance tolerance, anxiety/pain coping strategies ? ?  ?Shoulder Instructions   ? ? ?  ?General Comments    ? ? ?Pertinent Vitals/ Pain       Pain Assessment ?Faces Pain Scale: Hurts a little bit ?Pain Location: abdomen, back ?Pain Descriptors / Indicators:  Aching ?Pain Intervention(s): Utilized relaxation techniques ? ?Home Living   ?  ?  ?  ?  ?  ?  ?  ?  ?  ?  ?  ?  ?  ?  ?  ?  ?  ?  ? ?  ?Prior Functioning/Environment    ?  ?  ?  ?   ? ?Frequency ? Min 2X/week  ? ? ? ? ?  ?Progress Toward Goals ? ?OT Goals(current goals can now be found in the care plan section) ? Progress towards OT goals: Progressing toward goals ? ?Acute Rehab OT Goals ?OT Goal Formulation: With patient ?Time For Goal Achievement: 07/21/21 ?Potential to Achieve Goals: Good  ?Plan Discharge plan remains appropriate   ? ?Co-evaluation ? ? ?   ?  ?  ?  ?  ? ?  ?AM-PAC OT "6 Clicks" Daily Activity     ?Outcome Measure ? ? Help from another person eating meals?: None ?Help from another person taking care of personal grooming?: None ?Help from another person toileting, which includes using toliet, bedpan, or urinal?: None ?Help from another person bathing (including washing, rinsing, drying)?: None ?Help from another person to put on and taking off regular upper body clothing?: None ?Help from another person to put on and taking off regular lower body clothing?: None ?6 Click Score: 24 ? ?  ?End of Session Equipment Utilized During Treatment: Rollator (4 wheels) ? ?OT Visit Diagnosis: Unsteadiness on feet (R26.81);Muscle weakness (generalized) (M62.81);Pain;Other symptoms and signs involving cognitive function ?  ?Activity Tolerance Patient tolerated treatment well ?  ?Patient Left Other (comment) (in common room) ?  ?Nurse Communication   ?  ? ?   ? ?Time: 6728-9791 ?OT Time Calculation (min): 24 min ? ?Charges: OT General Charges ?$OT Visit: 1 Visit ?OT Treatments ?$Self Care/Home Management : 23-37 mins ? ?Josiah Lobo, PhD, MS, OTR/L ?07/14/21, 3:19 PM ? ?

## 2021-07-14 NOTE — Progress Notes (Signed)
Recreation Therapy Notes ? ?Date: 07/14/2021  ?  ?Time: 1:25 PM   ?  ?Location: Courtyard  ?  ?Behavioral response: Appropriate ?  ?Intervention Topic:  Social Skills  ?  ?Discussion/Intervention:  ?Group content on today was focused on social skills. The group defined social skills and identified ways they use social skills. Patients expressed what obstacles they face when trying to be social. Participants described the importance of social skills. The group listed ways to improve social skills and reasons to improve social skills. Individuals had an opportunity to learn new and improve social skills as well as identify their weaknesses. ?  ?Clinical Observations/Feedback: ?Patient came to group and expressed past experiences with socializing.Individual was social with peers and staff while participating in the intervention.   ?Arrie Borrelli LRT/CTRS  ?  ?  ?   ?  ?  ? ? ? ? ? ? ? ?Aida Lemaire ?07/14/2021 3:19 PM ?

## 2021-07-14 NOTE — Group Note (Signed)
LCSW Group Therapy Note ? ?Group Date: 07/14/2021 ?Start Time: 1315 ?End Time: 1400 ? ? ?Type of Therapy and Topic:  Group Therapy - Healthy vs Unhealthy Coping Skills ? ?Participation Level:  Active  ? ?Description of Group ?The focus of this group was to determine what unhealthy coping techniques typically are used by group members and what healthy coping techniques would be helpful in coping with various problems. Patients were guided in becoming aware of the differences between healthy and unhealthy coping techniques. Patients were asked to identify 2-3 healthy coping skills they would like to learn to use more effectively. ? ?Therapeutic Goals ?Patients learned that coping is what human beings do all day long to deal with various situations in their lives ?Patients defined and discussed healthy vs unhealthy coping techniques ?Patients identified their preferred coping techniques and identified whether these were healthy or unhealthy ?Patients determined 2-3 healthy coping skills they would like to become more familiar with and use more often. ?Patients provided support and ideas to each other ? ? ?Summary of Patient Progress:  Patient was present for the entirety of the group session. Patient was an active listener and participated in the topic of discussion. Participant had pleasant mood but stated she had "mixed feelings" regarding her upcoming discharge. She stated talking about her thoughts and feelings have been helpful skills she had not utilized in the past.  ? ? ?Therapeutic Modalities ?Cognitive Behavioral Therapy ?Motivational Interviewing ? ?Antasia Haider A [place], [place] ?07/14/2021  3:11 PM   ?

## 2021-07-14 NOTE — Plan of Care (Signed)
Patient calm and complaint on unit during shift.  Pt denies SI/HI/Avh and contracts for safety.  Pt did endorse Depression 6/10. Pt started Prozac to manage symptoms.  Pt did comply with scheduled medications and denies any adverse effects.  Pt did have night snack.  Q 15 minute safety checks in place.  ? ? ?Problem: Pain Managment: ?Goal: General experience of comfort will improve ?Outcome: Progressing ?  ?Problem: Coping: ?Goal: Coping ability will improve ?Outcome: Progressing ?  ?Problem: Health Behavior/Discharge Planning: ?Goal: Identification of resources available to assist in meeting health care needs will improve ?Outcome: Progressing ?  ?

## 2021-07-14 NOTE — BHH Counselor (Signed)
CSW talked with pt's daughter, [name], [phone] regarding discharge. CSW informed her of pending discharge on Friday. She stated that she could provide transportation and pick up pt around 2pm. She requested that scripts be sent to the [facility] in [place] on [id number] [place], [place] [address]. No other requests were made. Conversation ended without incident.  ? ?Smita Lesh [place], MSW, LCSW-A ?4/26/20234:09 PM  ?

## 2021-07-14 NOTE — Progress Notes (Signed)
Patient denies SI, HI, and AVH. She says her back pain is better than usual and rated it as a 5/10. She is pleasant and cooperative with assessment. Patient initially stated she felt nauseated when waking up, but felt better after breakfast. Patient is compliant with scheduled medications. She is active in the milieu and is observed to walk around the unit, sit in the dayroom, and attend morning group. Patient remains safe on the unit at this time. ?

## 2021-07-15 DIAGNOSIS — F332 Major depressive disorder, recurrent severe without psychotic features: Secondary | ICD-10-CM | POA: Diagnosis not present

## 2021-07-15 NOTE — Progress Notes (Signed)
Recreation Therapy Notes ? ?Date: 07/15/2021 ? ?Time: 1:40pm  ? ?Location: Craft room   ? ?Behavioral response: N/A ?  ?Intervention Topic: Wellness  ? ?Discussion/Intervention: ?Patient refused to attend group.  ? ?Clinical Observations/Feedback:  ?Patient refused to attend group.  ?  ?Iven Earnhart LRT/CTRS ? ? ? ? ? ? ? ? ?Tionne Carelli ?07/15/2021 2:57 PM ?

## 2021-07-15 NOTE — Progress Notes (Deleted)
Sitting outside pt door as pt refuses to stay in her room. Pt walks back and forth to nurse's station asking for different things and also talking to herself. Pt uses walker and doesn't want to wear socks but c/o bone spurs in feet. "I need to wind down. I'm gonna look at my book. Where's my Orajel? I can't sleep without some." Pt in room but only laid down with much encouragement. ?

## 2021-07-15 NOTE — Progress Notes (Signed)
Patient denies SI, HI, and AVH. Patient says she slept well. She denies depression and anxiety. She says that she feels "less alert, almost like I am drunk but not" and thinks it may be due to the Prozac. Patient is compliant with scheduled medications. She interacts appropriately with other patients and staff. Patient is present in the milieu and attended morning group. Patient remains safe on the unit at this time. ?

## 2021-07-15 NOTE — [facility] Group Notes (Signed)
[facility] Group Notes:  (Nursing/MHT/Case Management/Adjunct) ? ?Date:  07/15/2021  ?Time:  11:20 AM ? ?Type of Therapy:  Psychoeducational Skills ? ?Participation Level:  Active ? ?Participation Quality:  Appropriate, Attentive, and Sharing ? ?Affect:  Appropriate ? ?Cognitive:  Appropriate ? ?Insight:  Improving ? ?Engagement in Group:  Developing/Improving and Engaged ? ?Modes of Intervention:  Activity, Discussion, Education, and Support ? ?Summary of Progress/Problems: ? ?She was sharing stories, laughing, and following along with group.  ? ?Tina Bailey ?07/15/2021, 11:20 AM ?

## 2021-07-15 NOTE — Progress Notes (Signed)
Physical Therapy Treatment ?Patient Details ?Name: Tina Bailey ?MRN: [id number] ?DOB: 01-Aug-1945 ?Today's Date: 07/15/2021 ? ? ?History of Present Illness Pt is a 76 y.o. female presenting to hospital 4/16 with chief complaint of suicidal intent and depression.  Seen in ED 2 days prior with presumed unintentional overdose (pt later reported it was a suicide attempt).  PMH includes chronic pain, fibromyalgia, O2 dependent, CHF, skin picking habit, TAA without rupture, COPD, and R IMN 2019 ? ?  ?PT Comments  ? ? Pt met ambulating in hallway with rollator (so pt ambulated back to her room for therapy session).  Reviewed pt's HEP and pt able to appropriately demonstrate sitting and standing transverse abdominal contractions (reviewed that pt could do exercise in sidelying, sitting, or standing), side-lying clamshell exercise B, and sitting LAQ's B.  Pt reporting no questions or concerns regarding HEP and also that she had 0/10 pain after receiving Tylenol recently.  D/t pt being independent with HEP and pain appearing improved/controlled, PT will sign off in house at this time (pt in agreement).  Recommend follow-up with OP PT to continue to improve strength and pain symptoms. ?   ?Recommendations for follow up therapy are one component of a multi-disciplinary discharge planning process, led by the attending physician.  Recommendations may be updated based on patient status, additional functional criteria and insurance authorization. ? ?Follow Up Recommendations ? Outpatient PT ?  ?  ?Assistance Recommended at Discharge PRN  ?Patient can return home with the following Assistance with cooking/housework;Assist for transportation ?  ?Equipment Recommendations ? None recommended by PT  ?  ?Recommendations for Other Services   ? ? ?  ?Precautions / Restrictions Precautions ?Precautions: Fall ?Precaution Comments: Uses O2 for longer ambulation distances ?Restrictions ?Weight Bearing Restrictions: No  ?  ? ?Mobility ? Bed  Mobility ?Overal bed mobility: Modified Independent ?Bed Mobility: Sidelying to Sit, Sit to Sidelying ?  ?Sidelying to sit: Modified independent (Device/Increase time) ?  ?  ?Sit to sidelying: Modified independent (Device/Increase time) ?General bed mobility comments: no difficulties noted ?  ? ?Transfers ?Overall transfer level: Modified independent ?Equipment used: Rollator (4 wheels) ?Transfers: Sit to/from Stand ?Sit to Stand: Modified independent (Device/Increase time) ?  ?  ?  ?  ?  ?General transfer comment: steady safe transfers ?  ? ?Ambulation/Gait ?Ambulation/Gait assistance: Modified independent (Device/Increase time) ?Gait Distance (Feet): 50 Feet ?Assistive device: Rollator (4 wheels) ?Gait Pattern/deviations: Step-through pattern ?  ?  ?  ?General Gait Details: steady step through gait pattern ? ? ?Stairs ?  ?  ?  ?  ?  ? ? ?Wheelchair Mobility ?  ? ?Modified Rankin (Stroke Patients Only) ?  ? ? ?  ?Balance Overall balance assessment: Modified Independent ?Sitting-balance support: No upper extremity supported, Feet supported ?Sitting balance-Leahy Scale: Normal ?Sitting balance - Comments: steady sitting reaching outside BOS ?  ?Standing balance support: No upper extremity supported ?Standing balance-Leahy Scale: Good ?Standing balance comment: steady standing reaching within BOS ?  ?  ?  ?  ?  ?  ?  ?  ?  ?  ?  ?  ? ?  ?Cognition Arousal/Alertness: Awake/alert ?Behavior During Therapy: Anxious ?Overall Cognitive Status: Within Functional Limits for tasks assessed ?  ?  ?  ?  ?  ?  ?  ?  ?  ?  ?  ?  ?  ?  ?  ?  ?General Comments: Verbose and tangential but very pleasant ?  ?  ? ?  ?  Exercises General Exercises - Lower Extremity ?Long Arc Quad: AROM, Strengthening, Both, 10 reps, Seated ?Other Exercises ?Other Exercises: Gentle core therex in sitting and standing with TA (transverse abdominal) contractions x10 reps with 5 second holds ?Other Exercises: B sidelying clamshell hip strengthening ex's AROM  x10 reps ? ?  ?General Comments  Pt agreeable to PT session. ?  ?  ? ?Pertinent Vitals/Pain Pain Assessment ?Pain Assessment: No/denies pain ?Pain Intervention(s): Limited activity within patient's tolerance, Monitored during session, Premedicated before session  ? ? ?Home Living   ?  ?  ?  ?  ?  ?  ?  ?  ?  ?   ?  ?Prior Function    ?  ?  ?   ? ?PT Goals (current goals can now be found in the care plan section) Acute Rehab PT Goals ?Patient Stated Goal: to improve pain ?PT Goal Formulation: With patient ?Time For Goal Achievement: 07/20/21 ?Potential to Achieve Goals: Fair ?Progress towards PT goals: Progressing toward goals ? ?  ?Frequency ? ? ? Min 2X/week ? ? ? ?  ?PT Plan  (DC pt in house)  ? ? ?Co-evaluation   ?  ?  ?  ?  ? ?  ?AM-PAC PT "6 Clicks" Mobility   ?Outcome Measure ? Help needed turning from your back to your side while in a flat bed without using bedrails?: None ?Help needed moving from lying on your back to sitting on the side of a flat bed without using bedrails?: None ?Help needed moving to and from a bed to a chair (including a wheelchair)?: None ?Help needed standing up from a chair using your arms (e.g., wheelchair or bedside chair)?: None ?Help needed to walk in hospital room?: None ?Help needed climbing 3-5 steps with a railing? : None ?6 Click Score: 24 ? ?  ?End of Session Equipment Utilized During Treatment: Gait belt ?Activity Tolerance: Patient tolerated treatment well ?Patient left: Other (comment) (ambulating to day room with rollator) ?Nurse Communication: Mobility status ?PT Visit Diagnosis: Pain;Muscle weakness (generalized) (M62.81) ?  ? ? ?Time: 1601-0932 ?PT Time Calculation (min) (ACUTE ONLY): 14 min ? ?Charges:  $Therapeutic Exercise: 8-22 mins          ?          ?Leitha Bleak, PT ?07/15/21, 5:47 PM ? ? ?

## 2021-07-15 NOTE — Progress Notes (Signed)
[facility] MD Progress Note ? ?07/15/2021 10:53 AM ?Tina Bailey  ?MRN:  [id number] ?Subjective: Shirrell seems to be doing better.  There is no report of her crying episodes.  She tells me that she feels woozy and the nurses notes state that she feels drunk and thinks that it might be the Prozac and I told her that does not usually do that.  When I looked at her chart she just had her Norco about an hour before.  Overall, her affect has improved. ? ?Principal Problem: Severe recurrent major depression without psychotic features ([place]) ?Diagnosis: Principal Problem: ?  Severe recurrent major depression without psychotic features ([place]) ? ?Total Time spent with patient: 15 minutes ? ?Past Psychiatric History: None ? ?Past Medical History:  ?Past Medical History:  ?Diagnosis Date  ? Arthritis   ? DDD. Right shoulder"is frozen"-limited ROM. osteoporosis.  ? Blue toe syndrome ([place]) 11/27/2014  ? Dr. Claudia Pollock evaluating  ? CHF (congestive heart failure) ([place])   ? Complication of anesthesia   ? COPD (chronic obstructive pulmonary disease) ([place])   ? Fibromyalgia   ? Fracture of rib of right side   ? hx "osteoporosis"- states her dog nudge her on the side, next day developed great pain and was told has a fracture rib.  ? Liver cancer ([place]) 12/2014  ? Macular degeneration   ? R eye  ? Osteoporosis   ? PONV (postoperative nausea and vomiting)   ? nausea,severe vomiting after 01-13-15 portal vein embolization  ? Productive cough   ? Retina disorder   ? L eye, vision distorted, edema  ? Spine fracture due to birth trauma   ? TMJ disease   ? Wears glasses   ?  ?Past Surgical History:  ?Procedure Laterality Date  ? ANGIOPLASTY  2008  ? no stents required, no follow-up with cardiologist, no recurrent chest pain  ? CATARACT EXTRACTION, BILATERAL Bilateral   ? ESOPHAGOGASTRODUODENOSCOPY (EGD) WITH PROPOFOL N/A 06/25/2015  ? Procedure: ESOPHAGOGASTRODUODENOSCOPY (EGD) WITH PROPOFOL;  Surgeon: Milus Banister, MD;  [facility];   Service: Endoscopy;  Laterality: N/A;  stent removal   ? EYE SURGERY    ? cornea surgery  ? INTRAMEDULLARY (IM) NAIL INTERTROCHANTERIC Right 12/04/2017  ? Procedure: INTRAMEDULLARY (IM) NAIL INTERTROCHANTRIC;  Surgeon: Rod Can, MD;  [facility];  Service: Orthopedics;  Laterality: Right;  ? IR RADIOLOGIST EVAL & MGMT  03/29/2017  ? KIDNEY STONE SURGERY    ? LAPAROSCOPIC PARTIAL HEPATECTOMY N/A 03/04/2015  ? Procedure: DIAGNOSTIC LAPAROSCOPY, EXTENDED RIGHT HEPATECTOMY, WITH INTRAOPERATIVE ULTRASOUND;  Surgeon: Stark Klein, MD;  [facility];  Service: General;  Laterality: N/A;  ? PARTIAL HYSTERECTOMY    ? portal vein embolization    ? 01-13-15 -Dr. Cathlean Sauer.  ? ?Family History:  ?Family History  ?Problem Relation Age of Onset  ? Hypertension Mother   ? Atrial fibrillation Mother   ? Heart disease Mother   ?     after age 16  ? Breast cancer Mother   ? Stroke Father   ? Dementia Father   ? Peptic Ulcer Father   ? Heart disease Brother   ?     After age 8- A-Fib  ? Heart attack Brother   ? ? ?Social History:  ?Social History  ? ?Substance and Sexual Activity  ?Alcohol Use No  ? Alcohol/week: 0.0 standard drinks  ?   ?Social History  ? ?Substance and Sexual Activity  ?Drug Use No  ?  ?Social History  ? ?  Socioeconomic History  ? Marital status: Divorced  ?  Spouse name: Not on file  ? Number of children: 3  ? Years of education: Not on file  ? Highest education level: Not on file  ?Occupational History  ? Occupation: retired  ?Tobacco Use  ? Smoking status: Former  ?  Packs/day: 1.00  ?  Years: 45.00  ?  Pack years: 45.00  ?  Types: Cigarettes  ?  Quit date: 02/20/2018  ?  Years since quitting: 3.4  ? Smokeless tobacco: Never  ?Vaping Use  ? Vaping Use: Never used  ?Substance and Sexual Activity  ? Alcohol use: No  ?  Alcohol/week: 0.0 standard drinks  ? Drug use: No  ? Sexual activity: Not Currently  ?Other Topics Concern  ? Not on file  ?Social History Narrative  ? Not on file  ? ?Social  Determinants of Health  ? ?Financial Resource Strain: Not on file  ?Food Insecurity: Not on file  ?Transportation Needs: Not on file  ?Physical Activity: Not on file  ?Stress: Not on file  ?Social Connections: Not on file  ? ?Additional Social History:  ?  ?  ?  ?  ?  ?  ?  ?  ?  ?  ?  ? ?Sleep: Fair ? ?Appetite:  Good ? ?Current Medications: ?Current Facility-Administered Medications  ?Medication Dose Route Frequency Provider Last Rate Last Admin  ? acetaminophen (TYLENOL) tablet 650 mg  650 mg Oral Q6H PRN Clapacs, John T, MD   650 mg at 07/14/21 1430  ? albuterol (PROVENTIL) (2.5 MG/3ML) 0.083% nebulizer solution 2.5 mg  2.5 mg Nebulization Q4H PRN Clapacs, John T, MD      ? alum & mag hydroxide-simeth (MAALOX/MYLANTA) 200-200-20 MG/5ML suspension 30 mL  30 mL Oral Q4H PRN Clapacs, John T, MD      ? amLODipine (NORVASC) tablet 5 mg  5 mg Oral Daily Parks Ranger, DO   5 mg at 07/15/21 0913  ? aspirin-acetaminophen-caffeine (EXCEDRIN MIGRAINE) per tablet 1-2 tablet  1-2 tablet Oral Q6H PRN Clapacs, Madie Reno, MD      ? cholecalciferol (VITAMIN D3) tablet 2,000 Units  2,000 Units Oral QPC lunch Clapacs, Madie Reno, MD   2,000 Units at 07/14/21 1211  ? FLUoxetine (PROZAC) capsule 20 mg  20 mg Oral Daily Parks Ranger, DO   20 mg at 07/15/21 0915  ? fluticasone furoate-vilanterol (BREO ELLIPTA) 200-25 MCG/ACT 1 puff  1 puff Inhalation Daily Benita Gutter, RPH   1 puff at 07/15/21 0801  ? gabapentin (NEURONTIN) capsule 300 mg  300 mg Oral QHS Parks Ranger, DO   300 mg at 07/14/21 2124  ? HYDROcodone-acetaminophen (NORCO) 10-325 MG per tablet 1 tablet  1 tablet Oral Q6H Clapacs, Madie Reno, MD   1 tablet at 07/15/21 0913  ? LORazepam (ATIVAN) tablet 1 mg  1 mg Oral QHS PRN Parks Ranger, DO      ? magnesium hydroxide (MILK OF MAGNESIA) suspension 30 mL  30 mL Oral Daily PRN Clapacs, Madie Reno, MD      ? mirtazapine (REMERON) tablet 15 mg  15 mg Oral QHS Parks Ranger, DO   15 mg  at 07/14/21 2124  ? OLANZapine (ZYPREXA) tablet 2.5 mg  2.5 mg Oral Q6H PRN Parks Ranger, DO      ? risperiDONE (RISPERDAL) tablet 0.25 mg  0.25 mg Oral [id number] Parks Ranger, DO   0.25 mg at 07/15/21 0800  ?  traZODone (DESYREL) tablet 50 mg  50 mg Oral QHS PRN Clapacs, John T, MD      ? umeclidinium bromide (INCRUSE ELLIPTA) 62.5 MCG/ACT 1 puff  1 puff Inhalation Daily Benita Gutter, RPH   1 puff at 07/15/21 0801  ? ? ?Lab Results: No results found for this or any previous visit (from the past 48 hour(s)). ? ?Blood Alcohol level:  ?Lab Results  ?Component Value Date  ? ETH <10 07/04/2021  ? ? ?Metabolic Disorder Labs: ?Lab Results  ?Component Value Date  ? HGBA1C 6.2 05/24/2021  ? ?No results found for: PROLACTIN ?Lab Results  ?Component Value Date  ? CHOL 190 05/24/2021  ? TRIG 65.0 05/24/2021  ? HDL 90.80 05/24/2021  ? CHOLHDL 2 05/24/2021  ? VLDL 13.0 05/24/2021  ? [place] 86 05/24/2021  ? [place] 82 01/09/2017  ? ? ?Physical Findings: ?AIMS: Facial and Oral Movements ?Muscles of Facial Expression: None, normal ?Lips and Perioral Area: None, normal ?Jaw: None, normal ?Tongue: None, normal,Extremity Movements ?Upper (arms, wrists, hands, fingers): None, normal ?Lower (legs, knees, ankles, toes): None, normal, Trunk Movements ?Neck, shoulders, hips: None, normal, Overall Severity ?Severity of abnormal movements (highest score from questions above): None, normal ?Incapacitation due to abnormal movements: None, normal ?Patient's awareness of abnormal movements (rate only patient's report): No Awareness, Dental Status ?Current problems with teeth and/or dentures?: No ?Does patient usually wear dentures?: No  ?CIWA:    ?COWS:    ? ?Musculoskeletal: ?Strength & Muscle Tone: within normal limits ?Gait & Station: normal ?Patient leans: N/A ? ?Psychiatric Specialty Exam: ? ?Presentation  ?General Appearance: No data recorded ?Eye Contact:No data recorded ?Speech:No data recorded ?Speech Volume:No  data recorded ?Handedness:No data recorded ? ?Mood and Affect  ?Mood:No data recorded ?Affect:No data recorded ? ?Thought Process  ?Thought Processes:No data recorded ?Descriptions of Associations:No data re

## 2021-07-15 NOTE — Plan of Care (Signed)
Patient Calm and Cooperative during shift.  Patient denies SI/HI/Avh and contracts for safety.  Pt denies Depression and Anxiety.  Patient did comply with scheduled medications and denies adverse reactions. Patient did participate in therapeutic milieu.  Pt did visit with daughter during shift.  Pt states pain improved with a 2/10 pain score. Pt did verbalize she learned the value in positive communication when you have a difference in opinion with others.  Encouragement and support given during shift.  Q 15 minute safety check in place.  Will continue plan of care.  ? ? ?Problem: Education: ?Goal: Knowledge of General Education information will improve ?Description: Including pain rating scale, medication(s)/side effects and non-pharmacologic comfort measures ?Outcome: Progressing ?  ?Problem: Health Behavior/Discharge Planning: ?Goal: Ability to manage health-related needs will improve ?Outcome: Progressing ?  ?Problem: Clinical Measurements: ?Goal: Will remain free from infection ?Outcome: Progressing ?  ?

## 2021-07-15 NOTE — Progress Notes (Signed)
Pt sitting in day room; calm, cooperative. Pt states "I feel okay." Pt c/o pain from "here (pointing to epigastric area) to around my back", which she rates 1/10. She denies SI/HI/AVH, anxiety and depression at this time. She describes her sleep as "pretty good" and her appetite as "good". No acute distress noted. ?

## 2021-07-16 DIAGNOSIS — F332 Major depressive disorder, recurrent severe without psychotic features: Secondary | ICD-10-CM | POA: Diagnosis not present

## 2021-07-16 MED ORDER — RISPERIDONE 0.25 MG PO TABS
0.2500 mg | ORAL_TABLET | ORAL | 3 refills | Status: DC
Start: 2021-07-16 — End: 2021-12-17

## 2021-07-16 MED ORDER — MIRTAZAPINE 15 MG PO TABS: 15.0000 mg | ORAL_TABLET | Freq: Every day | ORAL | 3 refills | Status: DC

## 2021-07-16 MED ORDER — AMLODIPINE BESYLATE 5 MG PO TABS
5.0000 mg | ORAL_TABLET | Freq: Every day | ORAL | 3 refills | Status: DC
Start: 2021-07-17 — End: 2021-12-07

## 2021-07-16 MED ORDER — GABAPENTIN 300 MG PO CAPS: 300.0000 mg | ORAL_CAPSULE | Freq: Every day | ORAL | 3 refills | Status: DC

## 2021-07-16 MED ORDER — FLUOXETINE HCL 20 MG PO CAPS: 20.0000 mg | ORAL_CAPSULE | Freq: Every day | ORAL | 3 refills | Status: DC

## 2021-07-16 NOTE — Progress Notes (Signed)
Pt denies SI, HI, and AVH. Patient was educated on prescriptions, follow up care, when to call for help/suicide hotline. Pt questions were answered and pt verbalized understanding and did not voice any concerns. Pt's belongings were returned. Pt was safely discharged to the [facility]. ?

## 2021-07-16 NOTE — Discharge Summary (Signed)
Physician Discharge Summary Note ? ?Patient:  Tina Bailey is an 76 y.o., female ?MRN:  [id number] ?DOB:  1945/05/12 ?Patient phone:  [phone] (home)  ?[place]   [place] [address],  ?Total Time spent with patient: 1 hour ? ?Date of Admission:  07/05/2021 ?Date of Discharge: 07/15/2021 ? ?Reason for Admission:  Tina Bailey is a 76 year old white female who presents on involuntary commitment from [place].  She is currently residing at [facility] but plans on going to [place].  She has multiple medical problems including CHF, COPD, chronic back pain with bulging disks and osteophytes, hypothyroidism, and history of hepatic cellular carcinoma.  She also has a lot of abdominal pain which has been worked up.  MRI of her abdomen on 01/10/2021 showed possible intraductal papillary neoplasm but needs to be followed up.  Due to her chronic pain she endorses anhedonia, difficulty sleeping, depressed mood, anxiety and suicidal thoughts.  She does not have a plan.  She is being treated with Cymbalta and BuSpar and I discussed changing her medicines and she was okay with that. ? ?Principal Problem: Severe recurrent major depression without psychotic features ([place]) ?Discharge Diagnoses: Principal Problem: ?  Severe recurrent major depression without psychotic features ([place]) ? ? ?Past Psychiatric History: None ? ?Past Medical History:  ?Past Medical History:  ?Diagnosis Date  ? Arthritis   ? DDD. Right shoulder"is frozen"-limited ROM. osteoporosis.  ? Blue toe syndrome ([place]) 11/27/2014  ? Dr. Claudia Pollock evaluating  ? CHF (congestive heart failure) ([place])   ? Complication of anesthesia   ? COPD (chronic obstructive pulmonary disease) ([place])   ? Fibromyalgia   ? Fracture of rib of right side   ? hx "osteoporosis"- states her dog nudge her on the side, next day developed great pain and was told has a fracture rib.  ? Liver cancer ([place]) 12/2014  ? Macular degeneration   ? R eye  ?  Osteoporosis   ? PONV (postoperative nausea and vomiting)   ? nausea,severe vomiting after 01-13-15 portal vein embolization  ? Productive cough   ? Retina disorder   ? L eye, vision distorted, edema  ? Spine fracture due to birth trauma   ? TMJ disease   ? Wears glasses   ?  ?Past Surgical History:  ?Procedure Laterality Date  ? ANGIOPLASTY  2008  ? no stents required, no follow-up with cardiologist, no recurrent chest pain  ? CATARACT EXTRACTION, BILATERAL Bilateral   ? ESOPHAGOGASTRODUODENOSCOPY (EGD) WITH PROPOFOL N/A 06/25/2015  ? Procedure: ESOPHAGOGASTRODUODENOSCOPY (EGD) WITH PROPOFOL;  Surgeon: Milus Banister, MD;  [facility];  Service: Endoscopy;  Laterality: N/A;  stent removal   ? EYE SURGERY    ? cornea surgery  ? INTRAMEDULLARY (IM) NAIL INTERTROCHANTERIC Right 12/04/2017  ? Procedure: INTRAMEDULLARY (IM) NAIL INTERTROCHANTRIC;  Surgeon: Rod Can, MD;  [facility];  Service: Orthopedics;  Laterality: Right;  ? IR RADIOLOGIST EVAL & MGMT  03/29/2017  ? KIDNEY STONE SURGERY    ? LAPAROSCOPIC PARTIAL HEPATECTOMY N/A 03/04/2015  ? Procedure: DIAGNOSTIC LAPAROSCOPY, EXTENDED RIGHT HEPATECTOMY, WITH INTRAOPERATIVE ULTRASOUND;  Surgeon: Stark Klein, MD;  [facility];  Service: General;  Laterality: N/A;  ? PARTIAL HYSTERECTOMY    ? portal vein embolization    ? 01-13-15 -Dr. Cathlean Sauer.  ? ?Family History:  ?Family History  ?Problem Relation Age of Onset  ? Hypertension Mother   ? Atrial fibrillation Mother   ? Heart disease Mother   ?  after age 49  ? Breast cancer Mother   ? Stroke Father   ? Dementia Father   ? Peptic Ulcer Father   ? Heart disease Brother   ?     After age 45- A-Fib  ? Heart attack Brother   ? ?Family Psychiatric  History: Unremarkable ?Social History:  ?Social History  ? ?Substance and Sexual Activity  ?Alcohol Use No  ? Alcohol/week: 0.0 standard drinks  ?   ?Social History  ? ?Substance and Sexual Activity  ?Drug Use No  ?  ?Social History   ? ?Socioeconomic History  ? Marital status: Divorced  ?  Spouse name: Not on file  ? Number of children: 3  ? Years of education: Not on file  ? Highest education level: Not on file  ?Occupational History  ? Occupation: retired  ?Tobacco Use  ? Smoking status: Former  ?  Packs/day: 1.00  ?  Years: 45.00  ?  Pack years: 45.00  ?  Types: Cigarettes  ?  Quit date: 02/20/2018  ?  Years since quitting: 3.4  ? Smokeless tobacco: Never  ?Vaping Use  ? Vaping Use: Never used  ?Substance and Sexual Activity  ? Alcohol use: No  ?  Alcohol/week: 0.0 standard drinks  ? Drug use: No  ? Sexual activity: Not Currently  ?Other Topics Concern  ? Not on file  ?Social History Narrative  ? Not on file  ? ?Social Determinants of Health  ? ?Financial Resource Strain: Not on file  ?Food Insecurity: Not on file  ?Transportation Needs: Not on file  ?Physical Activity: Not on file  ?Stress: Not on file  ?Social Connections: Not on file  ? ? ?Hospital Course: [place] was admitted under routine orders and precautions to the geriatric psychiatry unit.  She has been treated for depression and pain by her primary care physician.  Her depression is pretty much directly related to her chronic back pain.  Her Cymbalta was discontinued and she was started on Remeron 15 mg at bedtime.  Due to her pain 300 mg of Neurontin were added she did well with this.  A low-dose of Risperdal 0.25 mg twice a day for depression and negative thoughts were added.  She was having some crying episodes so Prozac was added at 20 mg/day.  She was very pleasant and cooperative on the unit.  She did not require any as needed medications including Ativan.  She slept well without any medications besides the Remeron and Neurontin.  She participated in groups and interacted well with staff and peers.  Her mood improved and it was thought that she maximized hospitalization she was discharged home.  On the day of discharge she denied suicidal ideation, homicidal ideation, auditory  or visual hallucinations.  Her judgment and insight were good. ? ?Physical Findings: ?AIMS: Facial and Oral Movements ?Muscles of Facial Expression: None, normal ?Lips and Perioral Area: None, normal ?Jaw: None, normal ?Tongue: None, normal,Extremity Movements ?Upper (arms, wrists, hands, fingers): None, normal ?Lower (legs, knees, ankles, toes): None, normal, Trunk Movements ?Neck, shoulders, hips: None, normal, Overall Severity ?Severity of abnormal movements (highest score from questions above): None, normal ?Incapacitation due to abnormal movements: None, normal ?Patient's awareness of abnormal movements (rate only patient's report): No Awareness, Dental Status ?Current problems with teeth and/or dentures?: No ?Does patient usually wear dentures?: No  ?CIWA:    ?COWS:    ? ?Musculoskeletal: ?Strength & Muscle Tone: within normal limits ?Gait & Station: normal ?Patient leans: N/A ? ? ?  Psychiatric Specialty Exam: ? ?Presentation  ?General Appearance: No data recorded ?Eye Contact:No data recorded ?Speech:No data recorded ?Speech Volume:No data recorded ?Handedness:No data recorded ? ?Mood and Affect  ?Mood:No data recorded ?Affect:No data recorded ? ?Thought Process  ?Thought Processes:No data recorded ?Descriptions of Associations:No data recorded ?Orientation:No data recorded ?Thought Content:No data recorded ?History of Schizophrenia/Schizoaffective disorder:No ? ?Duration of Psychotic Symptoms:No data recorded ?Hallucinations:No data recorded ?Ideas of Reference:No data recorded ?Suicidal Thoughts:No data recorded ?Homicidal Thoughts:No data recorded ? ?Sensorium  ?Memory:No data recorded ?Judgment:No data recorded ?Insight:No data recorded ? ?Executive Functions  ?Concentration:No data recorded ?Attention Span:No data recorded ?Recall:No data recorded ?Fund of [place] recorded ?Language:No data recorded ? ?Psychomotor Activity  ?Psychomotor Activity:No data recorded ? ?Assets  ?Assets:No data  recorded ? ?Sleep  ?Sleep:No data recorded ? ? ?Physical Exam: ?Physical Exam ?Vitals and nursing note reviewed.  ?Constitutional:   ?   Appearance: Normal appearance. She is normal weight.  ?Neurological:  ?   General: No

## 2021-07-16 NOTE — Progress Notes (Signed)
?  [facility] Adult Case Management Discharge Plan : ? ?Will you be returning to the same living situation after discharge:  Yes,  pt will be returning to her assisted living facility [facility] ?At discharge, do you have transportation home?: Yes,  pt's daughter will be providing transportation ?Do you have the ability to pay for your medications: Yes,  pt has [place] Medicare ? ?Release of information consent forms completed and in the chart;  Patient's signature needed at discharge. ? ?Patient to Follow up at: ? Follow-up Information   ? ? [facility] Follow up on 07/30/2021.   ?Why: Updated Name: [facility]. [address]. You have a follow up appointment scheduled for in-person on Friday May 12th at 3pm. Please arrive 20 minutes early to complete initial patient paper work if you do not complete beforehand. *Please contact office if mental health crisis occurs before scheduled appointment, you can be scheduled day of. Thanks! ?Contact information: ?[place] [id number] ?[phone] ? ? ?  ?  ? ?  ?  ? ?  ? ? ?Next level of care provider has access to [facility] ? ?Safety Planning and Suicide Prevention discussed: Yes,  SPE completed with pt's daughter ? ?  ? ?Has patient been referred to the Quitline?: Patient refused referral ? ?Patient has been referred for addiction treatment: N/A ? ?Tina Bailey, LCSWA ?07/16/2021, 10:58 AM ?

## 2021-07-16 NOTE — Care Management Important Message (Signed)
Important Message ? ?Patient Details  ?Name: Tina Bailey ?MRN: [id number] ?Date of Birth: 04-29-1945 ? ? ?Medicare Important Message Given:  Yes ? ? ? ? ?Dwan Hemmelgarn A [place], [place] ?07/16/2021, 10:53 AM ?

## 2021-07-16 NOTE — BHH Suicide Risk Assessment (Signed)
[facility] Discharge Suicide Risk Assessment ? ? ?Principal Problem: Severe recurrent major depression without psychotic features ([place]) ?Discharge Diagnoses: Principal Problem: ?  Severe recurrent major depression without psychotic features ([place]) ? ? ?Total Time spent with patient: 1 hour ? ?Musculoskeletal: ?Strength & Muscle Tone: within normal limits ?Gait & Station: normal ?Patient leans: N/A ? ?Psychiatric Specialty Exam ? ?Presentation  ?General Appearance: No data recorded ?Eye Contact:No data recorded ?Speech:No data recorded ?Speech Volume:No data recorded ?Handedness:No data recorded ? ?Mood and Affect  ?Mood:No data recorded ?Duration of Depression Symptoms: Greater than two weeks ? ?Affect:No data recorded ? ?Thought Process  ?Thought Processes:No data recorded ?Descriptions of Associations:No data recorded ?Orientation:No data recorded ?Thought Content:No data recorded ?History of Schizophrenia/Schizoaffective disorder:No ? ?Duration of Psychotic Symptoms:No data recorded ?Hallucinations:No data recorded ?Ideas of Reference:No data recorded ?Suicidal Thoughts:No data recorded ?Homicidal Thoughts:No data recorded ? ?Sensorium  ?Memory:No data recorded ?Judgment:No data recorded ?Insight:No data recorded ? ?Executive Functions  ?Concentration:No data recorded ?Attention Span:No data recorded ?Recall:No data recorded ?Fund of [place] recorded ?Language:No data recorded ? ?Psychomotor Activity  ?Psychomotor Activity:No data recorded ? ?Assets  ?Assets:No data recorded ? ?Sleep  ?Sleep:No data recorded ? ? ?Blood pressure 108/84, pulse 95, temperature 98 ?F (36.7 ?C), temperature source Oral, resp. rate 18, height '5\' 1"'$  (1.549 m), weight 127.2 kg, SpO2 92 %. Body mass index is 52.99 kg/m?. ? ?Mental Status Per Nursing Assessment::   ?On Admission:  Suicidal ideation indicated by patient ? ?Demographic Factors:  ?NA ? ?Loss Factors: ?NA ? ?Historical Factors: ?NA ? ?Risk Reduction Factors:    ?NA ? ?Continued Clinical Symptoms:  ?Medical Diagnoses and Treatments/Surgeries ? ?Cognitive Features That Contribute To Risk:  ?None   ? ?Suicide Risk:  ?Minimal: No identifiable suicidal ideation.  Patients presenting with no risk factors but with morbid ruminations; may be classified as minimal risk based on the severity of the depressive symptoms ? ? ? ?Plan Of Care/Follow-up recommendations: See Social Work Note ? ? ?[place] Ranger, DO ?07/16/2021, 10:46 AM ?

## 2021-07-21 ENCOUNTER — Telehealth (HOSPITAL_BASED_OUTPATIENT_CLINIC_OR_DEPARTMENT_OTHER): Payer: Self-pay | Admitting: *Deleted

## 2021-07-21 ENCOUNTER — Ambulatory Visit: Payer: Self-pay | Admitting: *Deleted

## 2021-07-21 NOTE — Telephone Encounter (Signed)
Daughter [name] calling in on the community line.  Her mother is lethargic.   She is not with her mother.   ? ? At the hospital they gave her a lot of medications when she was in the emergency room.    The pain issue is resolved but she is so tired.   She feels drugged.   The psych. Dr gave her these medications at the hospital.  They all say a side effect is sleepiness.  Daughter called the psych dr. But since they haven't seen her mother yet they told her,   "Just stop giving her the drugs".   [name] said I didn't think that was right.  That's why I'm calling. ? ?I referred her back to the ED since her mother is not established with the psych dr yet and she is very tired and sleepy.   She made need a medication adjustment.  [name] was agreeable to this plan and thanked me very much for my help. ? ? ?Reason for Disposition ? Health Information question, no triage required and triager able to answer question ? ?Answer Assessment - Initial Assessment Questions ?1. REASON FOR CALL or QUESTION: "What is your reason for calling today?" or "How can I best help you?" or "What question do you have that I can help answer?" ?    [name], the pt's daughter called in on the community line seeking advice.   She is not with her mother. ? ?See documentation notes. ? ?Protocols used: Information Only Call - No Triage-A-AH ? ?

## 2021-07-21 NOTE — Telephone Encounter (Signed)
? ?  Pre-operative Risk Assessment  ?  ?Patient Name: Tina Bailey  ?DOB: [birth year] ?MRN: [id number]  ? ?  ? ?Request for Surgical Clearance   ? ?Procedure:   ENDOSCOPY  ? ?Date of Surgery:  Clearance 08/26/21                              ?   ?Surgeon:    ?Surgeon's Group or Practice Name:  [facility]  ?Phone number:  [phone]  ?Fax number:  [phone] ?  ?Type of Clearance Requested:   ?- Medical  ?  ?Type of Anesthesia:  DEEP SEDATION  ?  ?  ? ?

## 2021-07-21 NOTE — Telephone Encounter (Signed)
?  Chief Complaint: Daughter seeking advice regarding pt.    Very sleepy from psych meds given to her in the ED ?Symptoms: Daughter not with mother (the pt) ?Frequency: N/A ?Pertinent Negatives: Patient denies N/A ?Disposition: '[x]'$ ED /'[]'$ Urgent Care (no appt availability in office) / '[]'$ Appointment(In office/virtual)/ '[]'$  [facility]/ '[]'$ Home Care/ '[]'$ Refused Recommended Disposition /'[]'$ [facility]/ '[]'$  Follow-up with PCP ?Additional Notes: Referred daughter to take her mother Tina Bailey back to the ED since she is so lethargic and sleepy from the psych meds.  ?

## 2021-07-21 NOTE — Telephone Encounter (Signed)
? ? ?  Name: Tina Bailey  ?DOB: 1945-04-07  ?MRN: [id number] ? ?Primary Cardiologist: Buford Dresser, MD ?Last OV w/ C. Walker NP 02/2021 ? ? ?Preoperative team, please contact this patient and set up a phone call appointment for further preoperative risk assessment. Please obtain consent and complete medication review. Thank you for your help. ? ?No anticoags listed to hold. ? ? ?Charlie Pitter, PA-C ?07/21/2021, 4:32 PM ?[facility] ?[address] ?[place], [place] [address] ? ? ?

## 2021-07-22 ENCOUNTER — Emergency Department
Admission: EM | Admit: 2021-07-22 | Discharge: 2021-07-22 | Disposition: A | Discharge status: Home / Self Care | Payer: Medicare HMO | Attending: Emergency Medicine | Admitting: Emergency Medicine

## 2021-07-22 ENCOUNTER — Emergency Department: Payer: Medicare HMO

## 2021-07-22 ENCOUNTER — Other Ambulatory Visit: Payer: Self-pay

## 2021-07-22 ENCOUNTER — Telehealth: Payer: Self-pay | Admitting: Internal Medicine

## 2021-07-22 ENCOUNTER — Encounter: Payer: Self-pay | Admitting: Emergency Medicine

## 2021-07-22 DIAGNOSIS — R0602 Shortness of breath: Secondary | ICD-10-CM | POA: Insufficient documentation

## 2021-07-22 DIAGNOSIS — J449 Chronic obstructive pulmonary disease, unspecified: Secondary | ICD-10-CM | POA: Diagnosis not present

## 2021-07-22 DIAGNOSIS — R079 Chest pain, unspecified: Secondary | ICD-10-CM | POA: Insufficient documentation

## 2021-07-22 DIAGNOSIS — R0600 Dyspnea, unspecified: Secondary | ICD-10-CM | POA: Insufficient documentation

## 2021-07-22 DIAGNOSIS — R4182 Altered mental status, unspecified: Secondary | ICD-10-CM | POA: Insufficient documentation

## 2021-07-22 DIAGNOSIS — R5383 Other fatigue: Secondary | ICD-10-CM | POA: Diagnosis not present

## 2021-07-22 DIAGNOSIS — I509 Heart failure, unspecified: Secondary | ICD-10-CM | POA: Insufficient documentation

## 2021-07-22 LAB — BASIC METABOLIC PANEL
Anion gap: 5 (ref 5–15)
BUN: 13 mg/dL (ref 8–23)
CO2: 33 mmol/L — ABNORMAL HIGH (ref 22–32)
Calcium: 8.4 mg/dL — ABNORMAL LOW (ref 8.9–10.3)
Chloride: 96 mmol/L — ABNORMAL LOW (ref 98–111)
Creatinine, Ser: 0.57 mg/dL (ref 0.44–1.00)
GFR, Estimated: >60 mL/min (ref >60)
Glucose, Bld: 121 mg/dL — ABNORMAL HIGH (ref 70–99)
Potassium: 4.6 mmol/L (ref 3.5–5.1)
Sodium: 134 mmol/L — ABNORMAL LOW (ref 135–145)

## 2021-07-22 LAB — CBC
HCT: 43.4 % (ref 36.0–46.0)
Hemoglobin: 12.8 g/dL (ref 12.0–15.0)
MCH: 28.8 pg (ref 26.0–34.0)
MCHC: 29.5 g/dL — ABNORMAL LOW (ref 30.0–36.0)
MCV: 97.7 fL (ref 80.0–100.0)
Platelets: 236 10*3/uL (ref 150–400)
RBC: 4.44 MIL/uL (ref 3.87–5.11)
RDW: 14 % (ref 11.5–15.5)
WBC: 5.4 10*3/uL (ref 4.0–10.5)
nRBC: 0 % (ref 0.0–0.2)

## 2021-07-22 LAB — TROPONIN I (HIGH SENSITIVITY)
Troponin I (High Sensitivity): <2 ng/L (ref <18)
Troponin I (High Sensitivity): <2 ng/L (ref <18)

## 2021-07-22 LAB — BRAIN NATRIURETIC PEPTIDE: B Natriuretic Peptide: 54.5 pg/mL (ref 0.0–100.0)

## 2021-07-22 MED ORDER — SPIRONOLACTONE 25 MG PO TABS: 25.0000 mg | ORAL_TABLET | Freq: Every day | ORAL | 0 refills | Status: DC

## 2021-07-22 NOTE — ED Triage Notes (Signed)
Daughter says pateint was in [facility] and since discharged has been tired,  difficult to wake.  Patient says harder to breath than usual.  Feels like a rock in her chest.  Also leg swelling. On 2 liters at home ?

## 2021-07-22 NOTE — Telephone Encounter (Signed)
Left message for the patient to contact office to schedule teleheatlh appointment.  ? ?Spoke with patients son-in-law, per DPR, and informed him to have the patient contact the office to schedule a telehealth appointment. ?

## 2021-07-22 NOTE — Telephone Encounter (Signed)
Clearance already entered on 5/3 for patient, duplicate shredded  ?

## 2021-07-22 NOTE — Telephone Encounter (Signed)
Form received for surgical clearance.  Placed on Dr. Gustavus Bryant desk for review.  Patient is currently in the ED with sob, Dr. Melvyn Novas made aware.  Nothing needed at this time. ?

## 2021-07-22 NOTE — Discharge Instructions (Signed)
As we discussed please begin taking your spironolactone tomorrow morning (fluid pill) for the next 7 days.  Please discontinue your morning Risperdal dose but continue taking your evening Risperdal dose.  Please follow-up with psychiatry next Friday as scheduled.  Return to the emergency department for any symptoms personally concerning to yourself especially any shortness of breath or chest pain. ?

## 2021-07-22 NOTE — ED Provider Notes (Signed)
? ?[facility] ?Provider Note ? ? ? Event Date/Time  ? First MD Initiated Contact with Patient 07/22/21 1551   ?  (approximate) ? ?History  ? ?Chief Complaint: Shortness of Breath, Chest Pain, and Altered Mental Status ? ?HPI ? ?Tina Bailey is a 76 y.o. female with a past medical history of CHF, COPD, fibromyalgia, presents to the emergency department with multiple complaints.  According to the patient and her family patient was discharged from the behavioral medicine floor 07/16/2021.  Patient has been home approximately 1 week.  Since getting home they have noticed that the patient is much more tired during the day.  They also noted this morning she was having some shortness of breath which she states has been an ongoing issue patient does wear 2 L of oxygen chronically.  They were concerned that the patient could be reacting to some of the medications so they brought her to the emergency department for evaluation.  Here the patient is awake alert she denies any shortness of breath currently.  No chest pain at any point. ? ?Physical Exam  ? ?Triage Vital Signs: ?ED Triage Vitals  ?Enc Vitals Group  ?   BP 07/22/21 0952 122/83  ?   Pulse Rate 07/22/21 0952 81  ?   Resp 07/22/21 0952 20  ?   Temp 07/22/21 0952 98.1 ?F (36.7 ?C)  ?   Temp Source 07/22/21 0952 Oral  ?   SpO2 07/22/21 0952 93 %  ?   Weight --   ?   Height 07/22/21 0953 '5\' 1"'$  (1.549 m)  ?   Head Circumference --   ?   Peak Flow --   ?   Pain Score 07/22/21 0953 0  ?   Pain Loc --   ?   Pain Edu? --   ?   Excl. in [place]? --   ? ? ?Most recent vital signs: ?Vitals:  ? 07/22/21 0952 07/22/21 1440  ?BP: 122/83 139/64  ?Pulse: 81 83  ?Resp: 20 16  ?Temp: 98.1 ?F (36.7 ?C) 98 ?F (36.7 ?C)  ?SpO2: 93% 90%  ? ? ?General: Awake, no distress.  ?CV:  Good peripheral perfusion.  Regular rate and rhythm  ?Resp:  Normal effort.  Equal breath sounds bilaterally.  ?Abd:  No distention.  Soft, nontender.  No rebound or guarding. ?Other:  1+ lower  extremity edema bilaterally. ? ? ?ED Results / Procedures / Treatments  ? ?EKG ? ?EKG viewed and interpreted by myself shows a normal sinus rhythm at 80 bpm with a narrow QRS, right axis deviation, largely normal intervals, no concerning ST changes. ? ?RADIOLOGY ? ?Chest x-ray reviewed and interpreted by myself appears to show bilateral pleural effusions.  The lungs himself appear clear. ?X-ray read as obstructive lung findings with interstitial thickening and bilateral pleural effusions consistent with mild first developing pulmonary edema. ? ? ?MEDICATIONS ORDERED IN ED: ?Medications - No data to display ? ? ?IMPRESSION / MDM / ASSESSMENT AND PLAN / ED COURSE  ?I reviewed the triage vital signs and the nursing notes. ? ?Patient presents emergency department for multiple complaints including increased fatigue throughout the day since being discharged from the behavioral health unit 1 week ago as well as shortness of breath at the same time.  As well.  They have noticed mild swelling in the legs.  In reviewing the patient's chart she has a history of CHF, daughter states they had some issues with fluid in the past  and they tried Lasix and it did not work.  I read the patient's cardiology notes from 2020 patient was tried on spironolactone and had a good response to that.  Given the patient's mild lower extremity edema with mild bilateral pleural effusions we will start the patient on 1 week of spironolactone.  I reviewed the patient's discharge summary from 07/16/2021 patient was started on multiple new medications that could be sedating including mirtazapine, Ativan and risperidone twice daily.  They have not yet started the Ativan I told him to hold off for now, they are taking mirtazapine and risperidone.  Risperidone is twice daily dose.  Given the patient's complaint of significant fatigue during the day I recommended they try to take risperidone only at night and not during the day.  They have a psychiatric  follow-up appointment in approximately 10 days this should be an adequate time to trial these new medication regimen.  Patient's work-up today in the emergency department is overall reassuring.  Troponin is negative x2.  Chemistry shows no significant findings.  CBC is normal including a normal white blood cell count.  Patient and daughter are agreeable to this plan. ? ?FINAL CLINICAL IMPRESSION(S) / ED DIAGNOSES  ? ?Fatigue ?Dyspnea ? ?Rx / DC Orders  ? ?Spironolactone x7 days ?Discontinue Risperdal during the daytime to continue evening dose ?Psychiatric follow-up next Friday. ? ? ?Note:  This document was prepared using Dragon voice recognition software and may include unintentional dictation errors. ?  Harvest Dark, MD ?07/22/21 1633 ? ?

## 2021-07-23 ENCOUNTER — Telehealth: Payer: Self-pay

## 2021-07-23 ENCOUNTER — Telehealth: Payer: Self-pay | Admitting: Internal Medicine

## 2021-07-23 NOTE — Telephone Encounter (Signed)
?  Patient Consent for Virtual Visit  ? ?Consent given by daughter, [name] who is POA  ?    ? ?[name] has provided verbal consent on 07/23/2021 for a virtual visit (video or telephone). ? ? ?CONSENT FOR VIRTUAL VISIT FOR:  Tina Bailey  ?By participating in this virtual visit I agree to the following: ? ?I hereby voluntarily request, consent and authorize [facility] and its employed or contracted physicians, physician assistants, nurse practitioners or other licensed health care professionals (the Practitioner), to provide me with telemedicine health care services (the ?Services") as deemed necessary by the treating Practitioner. I acknowledge and consent to receive the Services by the Practitioner via telemedicine. I understand that the telemedicine visit will involve communicating with the Practitioner through live audiovisual communication technology and the disclosure of certain medical information by electronic transmission. I acknowledge that I have been given the opportunity to request an in-person assessment or other available alternative prior to the telemedicine visit and am voluntarily participating in the telemedicine visit. ? ?I understand that I have the right to withhold or withdraw my consent to the use of telemedicine in the course of my care at any time, without affecting my right to future care or treatment, and that the Practitioner or I may terminate the telemedicine visit at any time. I understand that I have the right to inspect all information obtained and/or recorded in the course of the telemedicine visit and may receive copies of available information for a reasonable fee.  I understand that some of the potential risks of receiving the Services via telemedicine include:  ?Delay or interruption in medical evaluation due to technological equipment failure or disruption; ?Information transmitted may not be sufficient (e.g. poor resolution of images) to allow for  appropriate medical decision making by the Practitioner; and/or  ?In rare instances, security protocols could fail, causing a breach of personal health information. ? ?Furthermore, I acknowledge that it is my responsibility to provide information about my medical history, conditions and care that is complete and accurate to the best of my ability. I acknowledge that Practitioner's advice, recommendations, and/or decision may be based on factors not within their control, such as incomplete or inaccurate data provided by me or distortions of diagnostic images or specimens that may result from electronic transmissions. I understand that the practice of medicine is not an exact science and that Practitioner makes no warranties or guarantees regarding treatment outcomes. I acknowledge that a copy of this consent can be made available to me via my patient portal ([facility]), or I can request a printed copy by calling the office of [facility].   ? ?I understand that my insurance will be billed for this visit.  ? ?I have read or had this consent read to me. ?I understand the contents of this consent, which adequately explains the benefits and risks of the Services being provided via telemedicine.  ?I have been provided ample opportunity to ask questions regarding this consent and the Services and have had my questions answered to my satisfaction. ?I give my informed consent for the services to be provided through the use of telemedicine in my medical care ? ? ?

## 2021-07-23 NOTE — Telephone Encounter (Signed)
Spoke with the patients daughter, [name], per DPR, and scheduled the patient an appointment. Medi list and consent completed.  ?

## 2021-07-23 NOTE — Telephone Encounter (Addendum)
Fax received from Dr. Eber Jones with [facility] to perform a Endoscopy on patient.  Patient needs surgery clearance. Patient was seen on 02/25/2021. Office protocol is a risk assessment can be sent to surgeon if patient has been seen in 60 days or less.  ? ?Sending to Dr. Melvyn Novas for risk assessment or recommendations if patient needs to be seen in office prior to surgical procedure.   ?

## 2021-07-25 ENCOUNTER — Emergency Department (HOSPITAL_COMMUNITY): Payer: Medicare HMO

## 2021-07-25 ENCOUNTER — Other Ambulatory Visit: Payer: Self-pay

## 2021-07-25 ENCOUNTER — Emergency Department (HOSPITAL_COMMUNITY)
Admission: EM | Admit: 2021-07-25 | Discharge: 2021-07-26 | Disposition: A | Discharge status: Other Acute Inpatient Hospital | Payer: Medicare HMO | Attending: Emergency Medicine | Admitting: Emergency Medicine

## 2021-07-25 DIAGNOSIS — R4182 Altered mental status, unspecified: Secondary | ICD-10-CM | POA: Diagnosis not present

## 2021-07-25 DIAGNOSIS — J441 Chronic obstructive pulmonary disease with (acute) exacerbation: Secondary | ICD-10-CM | POA: Diagnosis not present

## 2021-07-25 DIAGNOSIS — Z20822 Contact with and (suspected) exposure to covid-19: Secondary | ICD-10-CM | POA: Insufficient documentation

## 2021-07-25 DIAGNOSIS — F332 Major depressive disorder, recurrent severe without psychotic features: Secondary | ICD-10-CM | POA: Diagnosis present

## 2021-07-25 DIAGNOSIS — I509 Heart failure, unspecified: Secondary | ICD-10-CM | POA: Diagnosis not present

## 2021-07-25 DIAGNOSIS — Z7982 Long term (current) use of aspirin: Secondary | ICD-10-CM | POA: Insufficient documentation

## 2021-07-25 DIAGNOSIS — R45851 Suicidal ideations: Secondary | ICD-10-CM | POA: Diagnosis not present

## 2021-07-25 DIAGNOSIS — Z79899 Other long term (current) drug therapy: Secondary | ICD-10-CM | POA: Diagnosis not present

## 2021-07-25 LAB — CBC WITH DIFFERENTIAL/PLATELET
Abs Immature Granulocytes: 0.01 10*3/uL (ref 0.00–0.07)
Basophils Absolute: 0 10*3/uL (ref 0.0–0.1)
Basophils Relative: 1 %
Eosinophils Absolute: 0.2 10*3/uL (ref 0.0–0.5)
Eosinophils Relative: 3 %
HCT: 38.1 % (ref 36.0–46.0)
Hemoglobin: 11.9 g/dL — ABNORMAL LOW (ref 12.0–15.0)
Immature Granulocytes: 0 %
Lymphocytes Relative: 20 %
Lymphs Abs: 1.5 10*3/uL (ref 0.7–4.0)
MCH: 30.3 pg (ref 26.0–34.0)
MCHC: 31.2 g/dL (ref 30.0–36.0)
MCV: 96.9 fL (ref 80.0–100.0)
Monocytes Absolute: 0.8 10*3/uL (ref 0.1–1.0)
Monocytes Relative: 10 %
Neutro Abs: 5 10*3/uL (ref 1.7–7.7)
Neutrophils Relative %: 66 %
Platelets: 238 10*3/uL (ref 150–400)
RBC: 3.93 MIL/uL (ref 3.87–5.11)
RDW: 13.7 % (ref 11.5–15.5)
WBC: 7.5 10*3/uL (ref 4.0–10.5)
nRBC: 0 % (ref 0.0–0.2)

## 2021-07-25 LAB — COMPREHENSIVE METABOLIC PANEL
ALT: 11 U/L (ref 0–44)
AST: 15 U/L (ref 15–41)
Albumin: 3.7 g/dL (ref 3.5–5.0)
Alkaline Phosphatase: 69 U/L (ref 38–126)
Anion gap: 7 (ref 5–15)
BUN: 16 mg/dL (ref 8–23)
CO2: 30 mmol/L (ref 22–32)
Calcium: 8.4 mg/dL — ABNORMAL LOW (ref 8.9–10.3)
Chloride: 98 mmol/L (ref 98–111)
Creatinine, Ser: 0.52 mg/dL (ref 0.44–1.00)
GFR, Estimated: >60 mL/min (ref >60)
Glucose, Bld: 93 mg/dL (ref 70–99)
Potassium: 4.3 mmol/L (ref 3.5–5.1)
Sodium: 135 mmol/L (ref 135–145)
Total Bilirubin: 0.5 mg/dL (ref 0.3–1.2)
Total Protein: 6.4 g/dL — ABNORMAL LOW (ref 6.5–8.1)

## 2021-07-25 LAB — RAPID URINE DRUG SCREEN, HOSP PERFORMED
Amphetamines: NOT DETECTED
Barbiturates: NOT DETECTED
Benzodiazepines: NOT DETECTED
Cocaine: NOT DETECTED
Opiates: POSITIVE — AB
Tetrahydrocannabinol: NOT DETECTED

## 2021-07-25 LAB — TROPONIN I (HIGH SENSITIVITY): Troponin I (High Sensitivity): <2 ng/L (ref <18)

## 2021-07-25 LAB — RESP PANEL BY RT-PCR (FLU A&B, COVID) ARPGX2
Influenza A by PCR: NEGATIVE
Influenza B by PCR: NEGATIVE
SARS Coronavirus 2 by RT PCR: NEGATIVE

## 2021-07-25 LAB — ACETAMINOPHEN LEVEL: Acetaminophen (Tylenol), Serum: <10 ug/mL — ABNORMAL LOW (ref 10–30)

## 2021-07-25 LAB — ETHANOL: Alcohol, Ethyl (B): <10 mg/dL (ref <10)

## 2021-07-25 LAB — SALICYLATE LEVEL: Salicylate Lvl: <7 mg/dL — ABNORMAL LOW (ref 7.0–30.0)

## 2021-07-25 LAB — BRAIN NATRIURETIC PEPTIDE: B Natriuretic Peptide: 39.3 pg/mL (ref 0.0–100.0)

## 2021-07-25 MED ORDER — RISPERIDONE 0.5 MG PO TABS: 0.2500 mg | ORAL_TABLET | Freq: Every day | ORAL | Status: DC

## 2021-07-25 MED ORDER — IPRATROPIUM-ALBUTEROL 0.5-2.5 (3) MG/3ML IN SOLN
3.0000 mL | Freq: Once | RESPIRATORY_TRACT | Status: AC
  Administered 2021-07-25: 3 mL via RESPIRATORY_TRACT
  Filled 2021-07-25: qty 3

## 2021-07-25 MED ORDER — MIRTAZAPINE 7.5 MG PO TABS
15.0000 mg | ORAL_TABLET | Freq: Every day | ORAL | Status: DC
  Administered 2021-07-25: 15 mg via ORAL
  Filled 2021-07-25: qty 2

## 2021-07-25 MED ORDER — MOMETASONE FURO-FORMOTEROL FUM 100-5 MCG/ACT IN AERO
2.0000 | INHALATION_SPRAY | Freq: Two times a day (BID) | RESPIRATORY_TRACT | Status: DC
  Filled 2021-07-25: qty 8.8

## 2021-07-25 MED ORDER — UMECLIDINIUM BROMIDE 62.5 MCG/ACT IN AEPB
1.0000 | INHALATION_SPRAY | Freq: Every day | RESPIRATORY_TRACT | Status: DC
  Filled 2021-07-25: qty 7

## 2021-07-25 MED ORDER — AMLODIPINE BESYLATE 5 MG PO TABS
5.0000 mg | ORAL_TABLET | Freq: Every day | ORAL | Status: DC
  Administered 2021-07-26: 5 mg via ORAL
  Filled 2021-07-25 (×2): qty 1

## 2021-07-25 MED ORDER — ALBUTEROL SULFATE (2.5 MG/3ML) 0.083% IN NEBU
2.5000 mg | INHALATION_SOLUTION | Freq: Once | RESPIRATORY_TRACT | Status: AC
  Administered 2021-07-25: 2.5 mg via RESPIRATORY_TRACT
  Filled 2021-07-25: qty 3

## 2021-07-25 MED ORDER — RISPERIDONE 0.5 MG PO TABS
0.2500 mg | ORAL_TABLET | ORAL | Status: DC
  Administered 2021-07-26: 0.25 mg via ORAL
  Filled 2021-07-25: qty 1

## 2021-07-25 MED ORDER — GABAPENTIN 300 MG PO CAPS
300.0000 mg | ORAL_CAPSULE | Freq: Every day | ORAL | Status: DC
  Administered 2021-07-25: 300 mg via ORAL
  Filled 2021-07-25: qty 1

## 2021-07-25 MED ORDER — BUDESON-GLYCOPYRROL-FORMOTEROL 160-9-4.8 MCG/ACT IN AERO: 2.0000 | INHALATION_SPRAY | Freq: Two times a day (BID) | RESPIRATORY_TRACT | Status: DC

## 2021-07-25 MED ORDER — ALBUTEROL SULFATE (2.5 MG/3ML) 0.083% IN NEBU: 2.5000 mg | INHALATION_SOLUTION | RESPIRATORY_TRACT | Status: DC | PRN

## 2021-07-25 MED ORDER — SPIRONOLACTONE 25 MG PO TABS
25.0000 mg | ORAL_TABLET | Freq: Every day | ORAL | Status: DC
  Administered 2021-07-26: 25 mg via ORAL
  Filled 2021-07-25 (×2): qty 1

## 2021-07-25 NOTE — ED Triage Notes (Signed)
Family states pt was recently discharged from [facility]. States they are adjust ing medications. Pt states she does not want to live any longer. Pt denies a plan. Pt also reports shortness of breath. States sats were 88% at home. ?

## 2021-07-25 NOTE — ED Provider Notes (Signed)
?[facility] DEPT ?Provider Note ? ? ?CSN: [id number] ?Arrival date & time: 07/25/21  1701 ? ?  ? ?History ? ?Chief Complaint  ?Patient presents with  ? Suicidal  ? ? ?Tina Bailey is a 76 y.o. female. ? ?Patient has a history of COPD and heart failure.  She has been talking about killing herself.  Her daughter brought her in here for suicidal ideations.  Patient also states that she is having more swelling to her ankles ? ?The history is provided by the patient and medical records. No language interpreter was used.  ?Altered Mental Status ?Presenting symptoms: behavior changes   ?Severity:  Severe ?Most recent episode:  Today ?Episode history:  Continuous ?Timing:  Constant ?Progression:  Worsening ?Chronicity:  Recurrent ?Context: not alcohol use   ?Associated symptoms: no abdominal pain, no hallucinations, no headaches, no rash and no seizures   ? ?  ? ?Home Medications ?Prior to Admission medications   ?Medication Sig Start Date End Date Taking? Authorizing Provider  ?albuterol (PROVENTIL) (2.5 MG/3ML) 0.083% nebulizer solution Take 3 mLs (2.5 mg total) by nebulization every 4 (four) hours as needed for wheezing or shortness of breath. 02/25/21   Tanda Rockers, MD  ?amLODipine (NORVASC) 5 MG tablet Take 1 tablet (5 mg total) by mouth daily. 07/17/21   Parks Ranger, DO  ?aspirin-acetaminophen-caffeine (EXCEDRIN MIGRAINE) [phone] MG tablet Take 1-2 tablets by mouth every 6 (six) hours as needed for headache.    [provider]  ?Budeson-Glycopyrrol-Formoterol (BREZTRI AEROSPHERE) 160-9-4.8 MCG/ACT AERO Inhale 2 puffs into the lungs in the morning and at bedtime. ?Patient taking differently: Inhale 2 puffs into the lungs 2 (two) times daily. 11/02/20   Tanda Rockers, MD  ?Cholecalciferol (VITAMIN D3) 50 MCG (2000 UT) capsule Take 2,000 Units by mouth daily after lunch. 04/05/21   [provider]  ?FLUoxetine (PROZAC) 20 MG capsule Take 1 capsule (20 mg  total) by mouth daily. 07/17/21   Parks Ranger, DO  ?gabapentin (NEURONTIN) 300 MG capsule Take 1 capsule (300 mg total) by mouth at bedtime. 07/16/21   Parks Ranger, DO  ?HYDROcodone-acetaminophen (NORCO) 10-325 MG tablet Take 1 tablet by mouth in the morning, at noon, in the evening, and at bedtime.    [provider]  ?melatonin 5 MG TABS Take 5 mg by mouth at bedtime as needed (sleep).    [provider]  ?mirtazapine (REMERON) 15 MG tablet Take 1 tablet (15 mg total) by mouth at bedtime. 07/16/21   Parks Ranger, DO  ?Pseudoephedrine-Ibuprofen (ADVIL COLD & SINUS LIQUI-GELS PO) Take 1 capsule by mouth daily as needed (pain).    [provider]  ?risperiDONE (RISPERDAL) 0.25 MG tablet Take 1 tablet (0.25 mg total) by mouth 2 (two) times daily at 8 am and 4 pm. ?Patient taking differently: Take 0.25 mg by mouth daily. At 4pm 07/16/21   Parks Ranger, DO  ?spironolactone (ALDACTONE) 25 MG tablet Take 1 tablet (25 mg total) by mouth daily for 7 days. 07/22/21 07/29/21  Harvest Dark, MD  ?   ? ?Allergies    ?Zoloft [sertraline hcl], Amoxicillin-pot clavulanate, Clindamycin/lincomycin, Paxil [paroxetine hcl], and Sulfa antibiotics   ? ?Review of Systems   ?Review of Systems  ?Constitutional:  Negative for appetite change and fatigue.  ?HENT:  Negative for congestion, ear discharge and sinus pressure.   ?Eyes:  Negative for discharge.  ?Respiratory:  Negative for cough.   ?Cardiovascular:  Negative for chest pain.  ?  Gastrointestinal:  Negative for abdominal pain and diarrhea.  ?Genitourinary:  Negative for frequency and hematuria.  ?Musculoskeletal:  Negative for back pain.  ?     Mild swelling in ankle  ?Skin:  Negative for rash.  ?Neurological:  Negative for seizures and headaches.  ?Psychiatric/Behavioral:  Positive for behavioral problems. Negative for hallucinations.   ? ?Physical Exam ?Updated Vital Signs ?BP 99/78   Pulse 85   Temp 97.9 ?F  (36.6 ?C) (Oral)   Resp (!) 23   Ht '5\' 1"'$  (1.549 m)   SpO2 97%   BMI 52.99 kg/m?  ?Physical Exam ?Vitals and nursing note reviewed.  ?Constitutional:   ?   Appearance: She is well-developed.  ?HENT:  ?   Head: Normocephalic.  ?   Mouth/Throat:  ?   Mouth: Mucous membranes are moist.  ?Eyes:  ?   General: No scleral icterus. ?   Conjunctiva/sclera: Conjunctivae normal.  ?Neck:  ?   Thyroid: No thyromegaly.  ?Cardiovascular:  ?   Rate and Rhythm: Normal rate and regular rhythm.  ?   Heart sounds: No murmur heard. ?  No friction rub. No gallop.  ?Pulmonary:  ?   Breath sounds: No stridor. No wheezing or rales.  ?Chest:  ?   Chest wall: No tenderness.  ?Abdominal:  ?   General: There is no distension.  ?   Tenderness: There is no abdominal tenderness. There is no rebound.  ?Musculoskeletal:     ?   General: Normal range of motion.  ?   Cervical back: Neck supple.  ?Lymphadenopathy:  ?   Cervical: No cervical adenopathy.  ?Skin: ?   Findings: No erythema or rash.  ?Neurological:  ?   Mental Status: She is alert and oriented to person, place, and time.  ?   Motor: No abnormal muscle tone.  ?   Coordination: Coordination normal.  ?Psychiatric:  ?   Comments: Patient is suicidal not homicidal  ? ? ?ED Results / Procedures / Treatments   ?Labs ?(all labs ordered are listed, but only abnormal results are displayed) ?Labs Reviewed  ?COMPREHENSIVE METABOLIC PANEL - Abnormal; Notable for the following components:  ?    Result Value  ? Calcium 8.4 (*)   ? Total Protein 6.4 (*)   ? All other components within normal limits  ?RAPID URINE DRUG SCREEN, HOSP PERFORMED - Abnormal; Notable for the following components:  ? Opiates POSITIVE (*)   ? All other components within normal limits  ?CBC WITH DIFFERENTIAL/PLATELET - Abnormal; Notable for the following components:  ? Hemoglobin 11.9 (*)   ? All other components within normal limits  ?ACETAMINOPHEN LEVEL - Abnormal; Notable for the following components:  ? Acetaminophen  (Tylenol), Serum <10 (*)   ? All other components within normal limits  ?SALICYLATE LEVEL - Abnormal; Notable for the following components:  ? Salicylate Lvl <2.8 (*)   ? All other components within normal limits  ?ETHANOL  ?BRAIN NATRIURETIC PEPTIDE  ?TROPONIN I (HIGH SENSITIVITY)  ?TROPONIN I (HIGH SENSITIVITY)  ? ? ?EKG ?EKG Interpretation ? ?Date/Time:  Sunday Jul 25 2021 18:51:38 EDT ?Ventricular Rate:  88 ?PR Interval:  135 ?QRS Duration: 81 ?QT Interval:  356 ?QTC Calculation: 431 ?R Axis:   84 ?Text Interpretation: Sinus rhythm Anteroseptal infarct, age indeterminate Confirmed by Milton Ferguson ([id number]) on 07/25/2021 7:29:38 PM ? ?Radiology ?DG Chest 2 View ? ?Result Date: 07/25/2021 ?CLINICAL DATA:  Shortness of breath. EXAM: CHEST - 2 VIEW COMPARISON:  Jul 22, 2021  FINDINGS: The heart size and mediastinal contours are within normal limits. There is marked severity tortuosity of the thoracic aorta. The lungs are hyperinflated. Mild, diffuse, chronic appearing increased interstitial lung markings are seen. There is no evidence of focal consolidation or pneumothorax. Small, stable bilateral pleural effusions are noted. Radiopaque surgical clips are seen within the right upper quadrant. Prior vertebroplasty is noted within the lower thoracic spine and mid lumbar spine. IMPRESSION: 1. Mild chronic appearing increased interstitial lung markings. 2. Small, stable bilateral pleural effusions. Electronically Signed   By: Virgina Norfolk M.D.   On: 07/25/2021 18:15  ? ?DG Abdomen 1 View ? ?Result Date: 07/25/2021 ?CLINICAL DATA:  Constipation. EXAM: ABDOMEN - 1 VIEW COMPARISON:  Abdominal radiograph dated 05/03/2021 and CT dated 12/11/2018. FINDINGS: Evaluation is limited due to patient's positioning and body habitus. No bowel dilatation or evidence of obstruction. No free air. Right upper quadrant cholecystectomy clips. Osteopenia with degenerative changes of the spine and vertebroplasty. No acute osseous pathology.  IMPRESSION: Nonobstructive bowel gas pattern. Electronically Signed   By: Anner Crete M.D.   On: 07/25/2021 20:41   ? ?Procedures ?Procedures  ? ? ?Medications Ordered in ED ?Medications  ?ipratropium-albuterol (DU

## 2021-07-25 NOTE — ED Provider Triage Note (Signed)
Emergency Medicine Provider Triage Evaluation Note ? ?Tina Bailey , a 76 y.o. female  was evaluated in triage.  Patient presents to the emergency department with her daughter due to shortness of breath, fatigue, increased leg swelling, as well as suicidal ideations.  Her daughter at bedside provides most of the history.  She states that last month she overdosed on benzodiazepines in an attempt to end her life.  She then spent 2 weeks admitted at [facility].  She was started on behavioral health medications that she was discharged on in late April.  She states that since being started on these medications her health is deteriorated, she is more fatigued than normal, and she is now requiring her supplemental 2 L via nasal cannula much more frequently due to shortness of breath.  She states that her mother is now endorsing suicidal ideations once again. ? ?Physical Exam  ?BP 109/62 (BP Location: Left Arm)   Pulse 87   Temp 97.9 ?F (36.6 ?C) (Oral)   Resp 18   Ht '5\' 1"'$  (1.549 m)   SpO2 94%   BMI 52.99 kg/m?  ?Gen:   Awake, no distress   ?Resp:  Normal effort  ?MSK:   Moves extremities without difficulty  ?Other:   ? ?Medical Decision Making  ?Medically screening exam initiated at 6:06 PM.  Appropriate orders placed.  Tina Bailey was informed that the remainder of the evaluation will be completed by another provider, this initial triage assessment does not replace that evaluation, and the importance of remaining in the ED until their evaluation is complete. ?  ?Rayna Sexton, PA-C ?07/25/21 1807 ? ?

## 2021-07-25 NOTE — ED Notes (Signed)
TTS being done at bedside now  ?
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
## 2021-07-26 MED ORDER — OXYCODONE-ACETAMINOPHEN 5-325 MG PO TABS
2.0000 | ORAL_TABLET | Freq: Four times a day (QID) | ORAL | Status: DC | PRN
  Administered 2021-07-26: 2 via ORAL
  Filled 2021-07-26: qty 2

## 2021-07-26 MED ORDER — HYDROCODONE-ACETAMINOPHEN 5-325 MG PO TABS
2.0000 | ORAL_TABLET | Freq: Four times a day (QID) | ORAL | Status: DC | PRN
  Administered 2021-07-26: 2 via ORAL
  Filled 2021-07-26 (×2): qty 2

## 2021-07-26 NOTE — ED Notes (Signed)
ED rn hooked pt up to humidified oxygen to help w/ pt discomfort ?

## 2021-07-26 NOTE — ED Notes (Signed)
Pt ambulated to restroom without distress. Fresh brief given. She reports she thinks she is withdrawing bc her feet are crawling. RN reassured her that she just got her pain meds.  ?

## 2021-07-26 NOTE — ED Notes (Signed)
Pt escorted to [employer] w/ ed Retail banker on personal portable oxygen and w/ assistance of personal walker.  Pt ambulates w/ steady gait w/ personal walker.  All belongings given to safe transporter.  Pt vitally stable and in NAD upon transfer. ?

## 2021-07-26 NOTE — ED Provider Notes (Signed)
Emergency Medicine Observation Re-evaluation Note ? ?Tina Bailey is a 76 y.o. female, seen on rounds today.  Pt initially presented to the ED for complaints of Suicidal ?Currently, the patient is sleeping. ? ?Physical Exam  ?BP 112/64   Pulse 74   Temp 97.9 ?F (36.6 ?C) (Oral)   Resp 17   Ht '5\' 1"'$  (1.549 m)   SpO2 98%   BMI 52.99 kg/m?  ?Physical Exam ?General: Sleeping, nondistressed ?Cardiac: Regular rate and rhythm ?Lungs: Breathing is even and unlabored ?Psych: Deferred ? ?ED Course / MDM  ?EKG:EKG Interpretation ? ?Date/Time:  Sunday Jul 25 2021 18:51:38 EDT ?Ventricular Rate:  88 ?PR Interval:  135 ?QRS Duration: 81 ?QT Interval:  356 ?QTC Calculation: 431 ?R Axis:   84 ?Text Interpretation: Sinus rhythm Anteroseptal infarct, age indeterminate Confirmed by Milton Ferguson ([id number]) on 07/25/2021 7:29:38 PM ? ?I have reviewed the labs performed to date as well as medications administered while in observation.  Recent changes in the last 24 hours include patient presented to the ED yesterday for suicidal ideation.  She has had suicide attempt in the past by overdose.  She has been evaluated by TTS and plan is for inpatient geriatric psychiatry admission.  Home medications ordered. ? ?Plan  ?Current plan is for inpatient geriatric-psychiatry placement. ? Tina Bailey is not under involuntary commitment. ? ? ?  ?Godfrey Pick, MD ?07/26/21 [phone] ? ?

## 2021-07-26 NOTE — ED Notes (Signed)
ED RN attempted to give report to [facility].  RN not available at this time.  ED RN asked by staff to call back in 20-30 minutes.  ?

## 2021-07-26 NOTE — ED Notes (Signed)
Pt continues to remove her oxygen. ED RN has explained to pt that she needs to leave her oxygen on but pt states "it bothers me".  ED RN informed sitter to help assist w/ keeping oxygen on pt.  ?

## 2021-07-26 NOTE — Progress Notes (Signed)
[facility]/BMU LCSW Progress Note ?  ?07/26/2021    1:26 PM ? ?Tina Bailey  ? ?[id number]  ? ?Type of Contact and Topic:  Psychiatric Bed Placement  ? ?Pt accepted to [facility]    ? ?Patient meets inpatient criteria per Quintella Reichert, NP  ? ?The attending provider will be Dr. Ananias Pilgrim ? ?Call report to [phone] ? ?Gearldine Shown, RN @ [facility] ED notified.    ? ?Pt scheduled  to arrive at [facility] ANYTIME.  ? ?Mariea Clonts, MSW, LCSW-A  ?1:28 PM 07/26/2021   ?  ? ?  ?  ? ? ? ? ?  ?

## 2021-07-26 NOTE — ED Notes (Signed)
PT cahnged into burgandy scrubs, fresh brief put on, belongings in locker behind nurses station ?

## 2021-07-26 NOTE — ED Notes (Signed)
MD Dixon notified and aware Safe transport is here for pt.  ED RN awaiting completion of EMTALA MD portion to transfer pt.  ?

## 2021-07-26 NOTE — Progress Notes (Signed)
Patient has been faxed out due to no appropriate beds available. Patient meets [place] inpatient criteria per Quintella Reichert, NP. Patient has been faxed out to the following facilities:  ? ?[facility]  [address] [place] [address] [phone]  ?[place], [place] [address] [phone]  ?[facility]  [place], [place] [address] [phone]  ?[facility]  [address] [place] [address] [phone]  ?[facility]  [address] [place], [place] [address] [phone]  ?[facility]  [place], [place] [address] [phone]  ?[facility]  [address], [place] [address] [phone]  ?[facility]  [address], [place] [address] [phone]  ?[place]  [address] [place] [address] [phone]  ?[facility]  [address] [place] [address] [phone]  ?[facility]  [address], [place] [address] [phone]  ?[facility]  [address], [place] [address] [phone]  ?[place], [place] [address] [phone]  ?[facility]  [place], [place] [address] [phone]  ?[facility]  [address], [place] [address] [phone]  ?[place] [address], [place] [address] [phone]  ? ?Mariea Clonts, MSW, LCSW-A  ?9:37 AM 07/26/2021   ?

## 2021-07-28 ENCOUNTER — Telehealth: Payer: Medicare HMO

## 2021-07-28 NOTE — Telephone Encounter (Signed)
If procedure is urgent then cleared for that but if it's purely elective would like to see her in office before clearing.  ?

## 2021-07-30 NOTE — Telephone Encounter (Signed)
Called and spoke with patient's daughter to let her know that we need to get her scheduled for OV for clearance. Patient is currently in hospital and they will call once she is back home. ?

## 2021-08-05 ENCOUNTER — Telehealth: Payer: Self-pay

## 2021-08-05 ENCOUNTER — Telehealth: Payer: Self-pay | Admitting: Internal Medicine

## 2021-08-05 NOTE — Telephone Encounter (Signed)
Form has been signed and faxed to [employer].

## 2021-08-05 NOTE — Telephone Encounter (Signed)
Noah Delaine has received this and has placed in the sign folder for Dr. Carlota Raspberry

## 2021-08-05 NOTE — Telephone Encounter (Signed)
Form received and placed in Dr. Morrison Old folder on his desk to be signed/ filled out.

## 2021-08-05 NOTE — Telephone Encounter (Signed)
Patient has been scheduled for OV Next Appt With Pulmonology Christinia Gully, MD) 08/23/2021 at 9:45 AM

## 2021-08-05 NOTE — Telephone Encounter (Signed)
Tina Bailey called back to check on the fax sent for oxygen. Need to sign in both places on the fax because pt is getting two machines. Tina Bailey states that it is urgent because pt is moving into a new assistant living place and does not have enough oxygen.  Tina Bailey phone number is [phone].

## 2021-08-05 NOTE — Telephone Encounter (Signed)
Type of form received: handicap placard  Is patient requesting call for pickup: Call daughter - [name]  Form placed:  in folder upfront   Attach charge sheet.  Provider will determine charge.Y  Individual made aware of 3-5 business day turn around Yes?

## 2021-08-05 NOTE — Telephone Encounter (Signed)
Form has been located and faxed to the [place] office for Dr. Melvyn Novas to fill out. Will forward to [employer]

## 2021-08-06 DIAGNOSIS — Z0279 Encounter for issue of other medical certificate: Secondary | ICD-10-CM

## 2021-08-11 ENCOUNTER — Telehealth: Payer: Self-pay | Admitting: Internal Medicine

## 2021-08-11 DIAGNOSIS — J449 Chronic obstructive pulmonary disease, unspecified: Secondary | ICD-10-CM

## 2021-08-11 DIAGNOSIS — R0902 Hypoxemia: Secondary | ICD-10-CM

## 2021-08-11 DIAGNOSIS — J9611 Chronic respiratory failure with hypoxia: Secondary | ICD-10-CM

## 2021-08-11 NOTE — Telephone Encounter (Signed)
Order has been placed to D/C oxygen. Daughter is aware. Nothing further needed at this time.

## 2021-08-11 NOTE — Telephone Encounter (Signed)
Called and spoke with patient's daughter [name] who is asking for an order be sent to [employer] to D/C oxygen because they are getting everything through [employer]. She states she called [employer] already and they said they need an order from [place].  Dr. Melvyn Novas please advise if you are ok with [place] placing order

## 2021-08-11 NOTE — Telephone Encounter (Signed)
Ok to d/c 02

## 2021-08-12 ENCOUNTER — Ambulatory Visit: Payer: Medicare HMO | Admitting: General Practice

## 2021-08-12 DIAGNOSIS — Z01810 Encounter for preprocedural cardiovascular examination: Secondary | ICD-10-CM

## 2021-08-12 NOTE — Progress Notes (Signed)
CLEARANCE NOTES HAVE BEEN FAXED TO REQUESTING OFFICE FAX # [phone]

## 2021-08-12 NOTE — Progress Notes (Signed)
Virtual Visit via Telephone Note   Because of CAMILLE DRAGAN co-morbid illnesses, she is at least at moderate risk for complications without adequate follow up.  This format is felt to be most appropriate for this patient at this time.  The patient did not have access to video technology/had technical difficulties with video requiring transitioning to audio format only (telephone).  All issues noted in this document were discussed and addressed.  No physical exam could be performed with this format.  Please refer to the patient's chart for her consent to telehealth for [facility].  Evaluation Performed:  Preoperative cardiovascular risk assessment _____________   Date:  08/12/2021   Patient ID:  Tina Bailey, DOB 1945-05-10, MRN [id number] Patient Location:  Home Provider location:   Office  Primary Care Provider:  Wendie Agreste, MD Primary Cardiologist:  Buford Dresser, MD  Chief Complaint / Patient Profile   76 y.o. y/o female with a h/o chronic diastolic CHF, essential hypertension, thoracic aortic aneurysm without rupture who is pending endoscopy and presents today for telephonic preoperative cardiovascular risk assessment.  Past Medical History    Past Medical History:  Diagnosis Date   Arthritis    DDD. Right shoulder"is frozen"-limited ROM. osteoporosis.   Blue toe syndrome ([place]) 11/27/2014   Dr. Claudia Pollock evaluating   CHF (congestive heart failure) (HCC)    Complication of anesthesia    COPD (chronic obstructive pulmonary disease) ([place])    Fibromyalgia    Fracture of rib of right side    hx "osteoporosis"- states her dog nudge her on the side, next day developed great pain and was told has a fracture rib.   Liver cancer ([place]) 12/2014   Macular degeneration    R eye   Osteoporosis    PONV (postoperative nausea and vomiting)    nausea,severe vomiting after 01-13-15 portal vein embolization   Productive cough    Retina disorder    L eye,  vision distorted, edema   Spine fracture due to birth trauma    TMJ disease    Wears glasses    Past Surgical History:  Procedure Laterality Date   ANGIOPLASTY  2008   no stents required, no follow-up with cardiologist, no recurrent chest pain   CATARACT EXTRACTION, BILATERAL Bilateral    ESOPHAGOGASTRODUODENOSCOPY (EGD) WITH PROPOFOL N/A 06/25/2015   Procedure: ESOPHAGOGASTRODUODENOSCOPY (EGD) WITH PROPOFOL;  Surgeon: Milus Banister, MD;  [facility]: [facility];  Service: Endoscopy;  Laterality: N/A;  stent removal    EYE SURGERY     cornea surgery   INTRAMEDULLARY (IM) NAIL INTERTROCHANTERIC Right 12/04/2017   Procedure: INTRAMEDULLARY (IM) NAIL INTERTROCHANTRIC;  Surgeon: Rod Can, MD;  Location: [facility] ORS;  Service: Orthopedics;  Laterality: Right;   IR RADIOLOGIST EVAL & MGMT  03/29/2017   KIDNEY STONE SURGERY     LAPAROSCOPIC PARTIAL HEPATECTOMY N/A 03/04/2015   Procedure: DIAGNOSTIC LAPAROSCOPY, EXTENDED RIGHT HEPATECTOMY, WITH INTRAOPERATIVE ULTRASOUND;  Surgeon: Stark Klein, MD;  Location: [facility] ORS;  Service: General;  Laterality: N/A;   PARTIAL HYSTERECTOMY     portal vein embolization     01-13-15 -Dr. Cathlean Sauer.    Allergies  Allergies  Allergen Reactions   Zoloft [Sertraline Hcl] Other (See Comments)    Zoloft, had a terrible reaction   Amoxicillin-Pot Clavulanate Nausea Only    Amoxacillin Augmentin combo  Has patient had a PCN reaction causing immediate rash, facial/tongue/throat swelling, SOB or lightheadedness with hypotension: No Has patient had a PCN reaction causing severe rash  involving mucus membranes or skin necrosis: No Has patient had a PCN reaction that required hospitalization No Has patient had a PCN reaction occurring within the last 10 years: No If all of the above answers are "NO", then may proceed with Cephalosporin use.    Clindamycin/Lincomycin Other (See Comments)    Felt like it "burned out" her stomach/took a large dose   Paxil  [Paroxetine Hcl] Rash   Sulfa Antibiotics Rash    History of Present Illness    Tina Bailey is a 76 y.o. female who presents via audio/video conferencing for a telehealth visit today.  Pt was last seen in cardiology clinic on 03/01/2021 by Laurann [place], NP-C.  At that time Tina Bailey was doing well .  The patient is now pending procedure as outlined above. Since her last visit, she remained stable from a cardiac standpoint.  Recent echocardiogram done at [facility] showed hyperdynamic EF, mild diastolic dysfunction, trivial mitral and tricuspid valve regurgitation.  Today she denies chest pain,  lower extremity edema, fatigue, palpitations, melena, hematuria, hemoptysis, diaphoresis, weakness, presyncope, syncope, orthopnea, and PND.   Home Medications    Prior to Admission medications   Medication Sig Start Date End Date Taking? Authorizing Provider  albuterol (PROVENTIL) (2.5 MG/3ML) 0.083% nebulizer solution Take 3 mLs (2.5 mg total) by nebulization every 4 (four) hours as needed for wheezing or shortness of breath. 02/25/21   Tanda Rockers, MD  amLODipine (NORVASC) 5 MG tablet Take 1 tablet (5 mg total) by mouth daily. Patient taking differently: Take 2.5 mg by mouth daily. 07/17/21   Parks Ranger, DO  aspirin-acetaminophen-caffeine (EXCEDRIN MIGRAINE) [phone] MG tablet Take 1-2 tablets by mouth every 6 (six) hours as needed for headache.    [provider]  Budeson-Glycopyrrol-Formoterol (BREZTRI AEROSPHERE) 160-9-4.8 MCG/ACT AERO Inhale 2 puffs into the lungs in the morning and at bedtime. Patient taking differently: Inhale 2 puffs into the lungs 2 (two) times daily. 11/02/20   Tanda Rockers, MD  Cholecalciferol (VITAMIN D3) 50 MCG (2000 UT) capsule Take 2,000 Units by mouth daily after lunch. 04/05/21   [provider]  FLUoxetine (PROZAC) 20 MG capsule Take 1 capsule (20 mg total) by mouth daily. 07/17/21   Parks Ranger,  DO  gabapentin (NEURONTIN) 300 MG capsule Take 1 capsule (300 mg total) by mouth at bedtime. 07/16/21   Parks Ranger, DO  HYDROcodone-acetaminophen [facility]) 10-325 MG tablet Take 1 tablet by mouth in the morning, at noon, in the evening, and at bedtime.    [provider]  melatonin 5 MG TABS Take 5 mg by mouth at bedtime as needed (sleep).    [provider]  mirtazapine (REMERON) 15 MG tablet Take 1 tablet (15 mg total) by mouth at bedtime. 07/16/21   Parks Ranger, DO  Plecanatide (TRULANCE) 3 MG TABS Take 3 mg by mouth daily. Sample    [provider]  Pseudoephedrine-Ibuprofen (ADVIL COLD & SINUS LIQUI-GELS PO) Take 1 capsule by mouth daily as needed (congestion & pain).    [provider]  risperiDONE (RISPERDAL) 0.25 MG tablet Take 1 tablet (0.25 mg total) by mouth 2 (two) times daily at 8 am and 4 pm. Patient taking differently: Take 0.25 mg by mouth daily. At 4pm 07/16/21   Parks Ranger, DO  spironolactone (ALDACTONE) 25 MG tablet Take 1 tablet (25 mg total) by mouth daily for 7 days. 07/22/21 07/29/21  Harvest Dark, MD    Physical Exam  Vital Signs:  Tina Bailey does not have vital signs available for review today.  Given telephonic nature of communication, physical exam is limited. AAOx3. NAD. Normal affect.  Speech and respirations are unlabored.  Accessory Clinical Findings    None  Assessment & Plan    1.  Preoperative Cardiovascular Risk Assessment: Endoscopy, [facility] endoscopy     Primary Cardiologist: Buford Dresser, MD  Chart reviewed as part of pre-operative protocol coverage. Given past medical history and time since last visit, based on ACC/AHA guidelines, DONALDA JOB would be at acceptable risk for the planned procedure without further cardiovascular testing.   Patient was advised that if she develops new symptoms prior to surgery to contact our office to arrange a follow-up  appointment.  She verbalized understanding.    A copy of this note will be routed to requesting surgeon.  Time:   Today, I have spent 7 minutes with the patient with telehealth technology discussing medical history, symptoms, and management plan.  I spent greater than 10 minutes reviewing patient's past medical history and medications prior to phone evaluation.   Deberah Pelton, NP  08/12/2021, 8:26 AM

## 2021-08-19 ENCOUNTER — Telehealth: Payer: Self-pay | Admitting: Family Medicine

## 2021-08-19 NOTE — Telephone Encounter (Signed)
FYI from pt °

## 2021-08-19 NOTE — Telephone Encounter (Signed)
Pt daughter called to inform Dr.Greene that mother have to be place in a faulty and faulty has their our doctor there.   PT and daughter wants to thank you Dr.Greene for everything.

## 2021-08-20 NOTE — Telephone Encounter (Signed)
Pt daughter was informed and thanked again for all the care

## 2021-08-20 NOTE — Telephone Encounter (Signed)
Noted. I appreciate the call and please let them know I appreciated the opportunity to care for Tina Bailey. Let me know if I need to send any records.

## 2021-08-22 NOTE — Progress Notes (Unsigned)
Subjective:    Patient ID: Tina Bailey, female    DOB: [birth year],    MRN: [id number]    Brief patient profile: 29 yowf quit smoking 2019 from [place]   with mostly c/o chronic cough x years attributed to pnds with dx of copd in 2008 but not on resp rx and referred to pulmonary clinic 03/03/2015 by Dr Barry Dienes with GOLD III criteria on spirometry     History of Present Illness  03/03/2015 1st [facility] office visit/ Joan Avetisyan   Chief Complaint  Patient presents with   PULMONARY CONSULT    Referred by Dr. Barry Dienes. Pt needs surgical clearance for liver resection tomorrow. Pt was told she has "mild" emphysema. Pt c/o occasional wheeze, cough with creamy thick mucus and some sinus/nasal congestion and drainage. Pt does smoke. Pt does not use inhalers.   cough x years esp in am's but Not limited by breathing from desired activities  Able to shop ok- no hcp Total amt of mucus typically < 2 tsp / white/ not usually bloody or purulent nor is it now rec You have moderately severe copd and need to quit smoking now but it is not prohibitive to surgery on your liver You are taking a risk by smoking up until the day of surgery > ideally you need to stop 2 weeks prior if at all possible to improve cough mechanics/ reduce sputum production  We can use duoneb (breathing treatments) perioperatively if needed     03/04/15  Liver surgery for hepatocellular ca / no chemo/RT but chronic pain since then     02/25/2021  f/u ov/Celena Lanius re: GOLD IV/ desats with ex  maint on breztri /   Chief Complaint  Patient presents with   Follow-up    Pt states need albuterol solution sent into pharmacy    Dyspnea:  [employer] leaning on cart x 30 min / hc parking on 2lpm pulsed and no desats   Cough: none Sleeping: sleeping on side/flat bed/ one pillow SABA use: neb but not using  02: 2 lpm pulsed not needing sitting or sleeping  Covid status:   [place]  Jan 2022  Oct 2022 / "every vax"   08/23/2021   f/u ov/Timberlee Roblero re: GOLD IV/ 02 dep  maint on breztri  but not sure she's getting it  Chief Complaint  Patient presents with   Follow-up    Needing pulmonary clearance for endoscopy. She has been using o2 24/7 since hospitalized in April 2023.   Dyspnea:  walks with rollator/ to DR 50-100 ft on 2lpm  Cough: none Sleeping: sleeping on falt bed one pillow SABA use: not needing  02: 2lpm 24/7      No obvious day to day or daytime variability or assoc excess/ purulent sputum or mucus plugs or hemoptysis or cp or chest tightness, subjective wheeze or overt sinus or hb symptoms.   Sleeping  without nocturnal  or early am exacerbation  of respiratory  c/o's or need for noct saba. Also denies any obvious fluctuation of symptoms with weather or environmental changes or other aggravating or alleviating factors except as outlined above   No unusual exposure hx or h/o childhood pna/ asthma or knowledge of premature birth.  Current Allergies, Complete Past Medical History, Past Surgical History, Family History, and Social History were reviewed in [employer] record.  ROS  The following are not active complaints unless bolded Hoarseness, sore throat, dysphagia, dental problems, itching, sneezing,  nasal congestion or discharge of excess mucus or purulent secretions, ear ache,   fever, chills, sweats, unintended wt loss or wt gain, classically pleuritic or exertional cp,  orthopnea pnd or arm/hand swelling  or leg swelling, presyncope, palpitations, abdominal pain, anorexia, nausea, vomiting, diarrhea  or change in bowel habits or change in bladder habits, change in stools or change in urine, dysuria, hematuria,  rash, arthralgias, visual complaints, headache, numbness, weakness or ataxia or problems with walking or coordination,  change in mood or  memory.        Current Meds  Medication Sig   albuterol (PROVENTIL) (2.5 MG/3ML) 0.083% nebulizer solution Take 3 mLs (2.5 mg total) by  nebulization every 4 (four) hours as needed for wheezing or shortness of breath.   amLODipine (NORVASC) 5 MG tablet Take 1 tablet (5 mg total) by mouth daily. (Patient taking differently: Take 2.5 mg by mouth daily.)   aspirin-acetaminophen-caffeine (EXCEDRIN MIGRAINE) 250-250-65 MG tablet Take 1-2 tablets by mouth every 6 (six) hours as needed for headache.   Budeson-Glycopyrrol-Formoterol (BREZTRI AEROSPHERE) 160-9-4.8 MCG/ACT AERO Inhale 2 puffs into the lungs in the morning and at bedtime. (Patient taking differently: Inhale 2 puffs into the lungs 2 (two) times daily.)   Cholecalciferol (VITAMIN D3) 50 MCG (2000 UT) capsule Take 2,000 Units by mouth daily after lunch.   FLUoxetine (PROZAC) 20 MG capsule Take 1 capsule (20 mg total) by mouth daily.   gabapentin (NEURONTIN) 300 MG capsule Take 1 capsule (300 mg total) by mouth at bedtime.   HYDROcodone-acetaminophen (NORCO) 10-325 MG tablet Take 1 tablet by mouth in the morning, at noon, in the evening, and at bedtime.   melatonin 5 MG TABS Take 5 mg by mouth at bedtime as needed (sleep).   mirtazapine (REMERON) 15 MG tablet Take 1 tablet (15 mg total) by mouth at bedtime.   naproxen (NAPROSYN) 500 MG tablet Take 500 mg by mouth 2 (two) times daily with a meal.   Plecanatide (TRULANCE) 3 MG TABS Take 3 mg by mouth daily. Sample   Pseudoephedrine-Ibuprofen (ADVIL COLD & SINUS LIQUI-GELS PO) Take 1 capsule by mouth daily as needed (congestion & pain).   risperiDONE (RISPERDAL) 0.25 MG tablet Take 1 tablet (0.25 mg total) by mouth 2 (two) times daily at 8 am and 4 pm. (Patient taking differently: Take 0.25 mg by mouth daily. At 4pm)                     Objective:   Physical Exam   Wts  08/23/2021          131  02/25/2021        129  11/02/2020        131  07/31/20            122  05/12/2017        92   02/02/2017     98   03/03/15 109 lb 6.4 oz (49.624 kg)  02/26/15 110 lb (49.896 kg)  01/13/15 114 lb (51.71 kg)     Vital signs  reviewed  08/23/2021  - Note at rest 02 sats  95% on 2lpm    General appearance:    pleasant elderly wf amb with walker   HEENT :  Oropharynx  clear  Nasal turbinates nl   NECK :  without JVD/Nodes/TM/ nl carotid upstrokes bilaterally   LUNGS: no acc muscle use,  Mod barrel /kyphotic  contour chest wall with bilateral  Distant bs s audible wheeze  and  without cough on insp or exp maneuvers and mod  Hyperresonant  to  percussion bilaterally     CV:  RRR  no s3 or murmur or increase in P2, and no edema   ABD:  soft and nontender with pos mid insp Hoover's  in the supine position. No bruits or organomegaly appreciated, bowel sounds nl  MS:   Ext warm without deformities or   obvious joint restrictions , calf tenderness, cyanosis or clubbing  SKIN: warm and dry without lesions    NEURO:  alert, approp, nl sensorium with  no motor or cerebellar deficits apparent.          I personally reviewed images and agree with radiology impression as follows:  CXR:   pa and lateral 07/25/21 eval sob 1. Mild chronic appearing increased interstitial lung markings. 2. Small, stable bilateral pleural effusions. My impression: severe T kyphosis       Assessment & Plan:

## 2021-08-23 ENCOUNTER — Ambulatory Visit: Payer: Medicare HMO | Admitting: Family Medicine

## 2021-08-23 ENCOUNTER — Ambulatory Visit: Payer: Medicare HMO | Admitting: Internal Medicine

## 2021-08-23 ENCOUNTER — Encounter: Payer: Self-pay | Admitting: Internal Medicine

## 2021-08-23 DIAGNOSIS — J9612 Chronic respiratory failure with hypercapnia: Secondary | ICD-10-CM

## 2021-08-23 DIAGNOSIS — J449 Chronic obstructive pulmonary disease, unspecified: Secondary | ICD-10-CM | POA: Diagnosis not present

## 2021-08-23 DIAGNOSIS — J9611 Chronic respiratory failure with hypoxia: Secondary | ICD-10-CM | POA: Diagnosis not present

## 2021-08-23 DIAGNOSIS — R0902 Hypoxemia: Secondary | ICD-10-CM | POA: Diagnosis not present

## 2021-08-23 MED ORDER — BREZTRI AEROSPHERE 160-9-4.8 MCG/ACT IN AERO
INHALATION_SPRAY | RESPIRATORY_TRACT | 11 refills | Status: DC
Start: 2021-08-23 — End: 2023-12-12

## 2021-08-23 MED ORDER — BREZTRI AEROSPHERE 160-9-4.8 MCG/ACT IN AERO: 2.0000 | INHALATION_SPRAY | Freq: Two times a day (BID) | RESPIRATORY_TRACT | 0 refills | Status: DC

## 2021-08-23 NOTE — Patient Instructions (Signed)
Plan A = Automatic = Always=    Breztri Take 2 puffs first thing in am and then another 2 puffs about 12 hours later.   Work on inhaler technique:  relax and gently blow all the way out then take a nice smooth full deep breath back in, triggering the inhaler at same time you start breathing in.  Hold for up to 5 seconds if you can. Blow out thru nose. Rinse and gargle with water when done.  If mouth or throat bother you at all,  try brushing teeth/gums/tongue with arm and hammer toothpaste/ make a slurry and gargle and spit out.    >>> remember how golfers warm up    Work on inhaler technique:  relax and gently blow all the way out then take a nice smooth full deep breath back in, triggering the inhaler at same time you start breathing in.  Hold for up to 5 seconds if you can. Blow out thru nose. Rinse and gargle with water when done.  If mouth or throat bother you at all,  try brushing teeth/gums/tongue with arm and hammer toothpaste/ make a slurry and gargle and spit out.       Plan B = Backup (to supplement plan A, not to replace it) Only use your albuterol nebulizer as a rescue medication to be used if you can't catch your breath by resting or doing a relaxed purse lip breathing pattern.  - The less you use it, the better it will work when you need it. - Ok to use the neb up to  every 4 hours if you must but call for appointment if use goes up over your usual need - Don't leave home without it !!  (think of it like the spare tire for your car)

## 2021-08-24 ENCOUNTER — Encounter: Payer: Self-pay | Admitting: Internal Medicine

## 2021-08-24 DIAGNOSIS — J9611 Chronic respiratory failure with hypoxia: Secondary | ICD-10-CM | POA: Insufficient documentation

## 2021-08-24 NOTE — Assessment & Plan Note (Addendum)
Qualified for amb 02 2022 07/31/2020  after desat to 85%ra with sob  < 250 ft, pt placed on POC 2lpm pulsed and sats increased to 93% and she walked additional 2 laps = 500 ft with sat at end 91%2lpm pulsed/moderate pace and minimal sob - 07/27/21    HCO3  = 33   - as of 08/23/2021 using 02 2lpm 24/7   Advised: Make sure you check your oxygen saturation  AT  your highest level of activity (not after you stop)   to be sure it stays over 90% and adjust  02 flow upward to maintain this level if needed but remember to turn it back to previous settings when you stop (to conserve your supply).    F/u can be q 6 m, sooner prn          Each maintenance medication was reviewed in detail including emphasizing most importantly the difference between maintenance and prns and under what circumstances the prns are to be triggered using an action plan format where appropriate.  Total time for H and P, chart review, counseling, reviewing hfa/neb/02 device(s) and generating customized AVS unique to this office visit / same day charting = 21 min

## 2021-08-24 NOTE — Assessment & Plan Note (Addendum)
Quit smoking 2019 Spirometry 03/03/2015   FEV1  1.04 (44%) ratio 58  - 03/03/2015  Walked RA x 3 laps @ 185 ft each stopped due to  End of study, brisk pace, no sob or desat   -  02/02/2017   Walked RA x one lap @ 185 stopped due to  Back pain/ sob no desats @ nl pace - 02/02/2017  After extensive coaching device effectiveness =    90% with DPI > try trelegy x one month then return for pfts  - Spirometry 05/12/2017  FEV1 0.58 (27%)  Ratio 44  With trelegy prior and no resp to saba  - 05/12/2017   try bevespi instead of trelegy due to throat irritation   - 07/31/2020 ex hypoxemia documented (see separate a/p)   - 08/23/2021  After extensive coaching inhaler device,  effectiveness =    50% (short Ti delayed insp)> continue work to improve with use of empty container   Group D (now reclassified as E) in terms of symptom/risk and laba/lama/ICS  therefore appropriate rx at this point >>>  Continue breztri approp saba prn   She is an acceptable risk for endoscopy planned.

## 2021-08-26 ENCOUNTER — Telehealth: Payer: Self-pay | Admitting: Internal Medicine

## 2021-08-26 NOTE — Telephone Encounter (Signed)
[facility] Calling again a/b surgical clearence need signed and sent back before 3 tomorrow please take care this office has called several times concerning this.Tina Bailey

## 2021-08-26 NOTE — Telephone Encounter (Signed)
[facility] and spoke with LuLu, advised that Dr. Melvyn Novas is working on an addendum to her 6/5 OV note for the surgical risk and once we have that we will fax it over.  Verified that the fax # is [phone].  Secure chat sent to Dr. Melvyn Novas regarding an addendum to his OV note for her 08/23/21 visit.  [place] regarding what medical procedure she is having.  Lulu was not available and VM was full.  ATC patient's mobile #,  no answer, no VM. Called [phone], spoke with patient's daughter, [name] [facility]), she is having an endoscopy tomorrow morning at 6:30 am at [facility].  Advised I would get the information to Dr. Melvyn Novas so he can do an addendum (surgical risk assessment) to his 6/5 OV with her and then it will be faxed to [facility].  She verbalized understanding.

## 2021-08-30 NOTE — Telephone Encounter (Signed)
The OV note from 08/23/21 with Dr Melvyn Novas was faxed to the number provided.

## 2021-09-02 NOTE — Telephone Encounter (Signed)
Tina Bailey, [place]      08/30/21 12:53 PM Note The OV note from 08/23/21 with Dr Melvyn Novas was faxed to the number provided.

## 2021-11-22 ENCOUNTER — Encounter (HOSPITAL_COMMUNITY): Payer: Self-pay

## 2021-11-22 ENCOUNTER — Other Ambulatory Visit: Payer: Self-pay

## 2021-11-22 ENCOUNTER — Inpatient Hospital Stay (HOSPITAL_COMMUNITY)
Admission: EM | Admit: 2021-11-22 | Discharge: 2021-12-07 | DRG: 918 | Disposition: A | Discharge status: Other Acute Inpatient Hospital | Payer: Medicare HMO | Attending: Internal Medicine | Admitting: Internal Medicine

## 2021-11-22 DIAGNOSIS — F418 Other specified anxiety disorders: Secondary | ICD-10-CM | POA: Diagnosis present

## 2021-11-22 DIAGNOSIS — Z882 Allergy status to sulfonamides status: Secondary | ICD-10-CM

## 2021-11-22 DIAGNOSIS — Z881 Allergy status to other antibiotic agents status: Secondary | ICD-10-CM

## 2021-11-22 DIAGNOSIS — H3530 Unspecified macular degeneration: Secondary | ICD-10-CM | POA: Diagnosis present

## 2021-11-22 DIAGNOSIS — Z9981 Dependence on supplemental oxygen: Secondary | ICD-10-CM

## 2021-11-22 DIAGNOSIS — J9611 Chronic respiratory failure with hypoxia: Secondary | ICD-10-CM | POA: Diagnosis present

## 2021-11-22 DIAGNOSIS — Z888 Allergy status to other drugs, medicaments and biological substances status: Secondary | ICD-10-CM

## 2021-11-22 DIAGNOSIS — R300 Dysuria: Secondary | ICD-10-CM

## 2021-11-22 DIAGNOSIS — Z8505 Personal history of malignant neoplasm of liver: Secondary | ICD-10-CM

## 2021-11-22 DIAGNOSIS — F419 Anxiety disorder, unspecified: Secondary | ICD-10-CM | POA: Diagnosis present

## 2021-11-22 DIAGNOSIS — C220 Liver cell carcinoma: Secondary | ICD-10-CM | POA: Diagnosis present

## 2021-11-22 DIAGNOSIS — Z79899 Other long term (current) drug therapy: Secondary | ICD-10-CM

## 2021-11-22 DIAGNOSIS — M797 Fibromyalgia: Secondary | ICD-10-CM | POA: Diagnosis present

## 2021-11-22 DIAGNOSIS — I5032 Chronic diastolic (congestive) heart failure: Secondary | ICD-10-CM | POA: Diagnosis present

## 2021-11-22 DIAGNOSIS — G8929 Other chronic pain: Secondary | ICD-10-CM | POA: Diagnosis present

## 2021-11-22 DIAGNOSIS — N200 Calculus of kidney: Secondary | ICD-10-CM | POA: Diagnosis present

## 2021-11-22 DIAGNOSIS — I4891 Unspecified atrial fibrillation: Secondary | ICD-10-CM | POA: Diagnosis not present

## 2021-11-22 DIAGNOSIS — I110 Hypertensive heart disease with heart failure: Secondary | ICD-10-CM | POA: Diagnosis present

## 2021-11-22 DIAGNOSIS — T391X2A Poisoning by 4-Aminophenol derivatives, intentional self-harm, initial encounter: Secondary | ICD-10-CM | POA: Diagnosis not present

## 2021-11-22 DIAGNOSIS — Z720 Tobacco use: Secondary | ICD-10-CM | POA: Diagnosis present

## 2021-11-22 DIAGNOSIS — R45851 Suicidal ideations: Secondary | ICD-10-CM | POA: Diagnosis not present

## 2021-11-22 DIAGNOSIS — I959 Hypotension, unspecified: Secondary | ICD-10-CM | POA: Diagnosis present

## 2021-11-22 DIAGNOSIS — F332 Major depressive disorder, recurrent severe without psychotic features: Secondary | ICD-10-CM | POA: Diagnosis present

## 2021-11-22 DIAGNOSIS — Z880 Allergy status to penicillin: Secondary | ICD-10-CM

## 2021-11-22 DIAGNOSIS — F1120 Opioid dependence, uncomplicated: Secondary | ICD-10-CM | POA: Diagnosis present

## 2021-11-22 DIAGNOSIS — Z751 Person awaiting admission to adequate facility elsewhere: Secondary | ICD-10-CM

## 2021-11-22 DIAGNOSIS — S32000A Wedge compression fracture of unspecified lumbar vertebra, initial encounter for closed fracture: Secondary | ICD-10-CM

## 2021-11-22 DIAGNOSIS — Z20822 Contact with and (suspected) exposure to covid-19: Secondary | ICD-10-CM | POA: Diagnosis present

## 2021-11-22 DIAGNOSIS — Z66 Do not resuscitate: Secondary | ICD-10-CM | POA: Diagnosis present

## 2021-11-22 DIAGNOSIS — I272 Pulmonary hypertension, unspecified: Secondary | ICD-10-CM | POA: Diagnosis present

## 2021-11-22 DIAGNOSIS — I10 Essential (primary) hypertension: Secondary | ICD-10-CM

## 2021-11-22 DIAGNOSIS — J449 Chronic obstructive pulmonary disease, unspecified: Secondary | ICD-10-CM | POA: Diagnosis present

## 2021-11-22 DIAGNOSIS — M810 Age-related osteoporosis without current pathological fracture: Secondary | ICD-10-CM | POA: Diagnosis present

## 2021-11-22 DIAGNOSIS — R7302 Impaired glucose tolerance (oral): Secondary | ICD-10-CM | POA: Diagnosis present

## 2021-11-22 DIAGNOSIS — T50902A Poisoning by unspecified drugs, medicaments and biological substances, intentional self-harm, initial encounter: Secondary | ICD-10-CM | POA: Diagnosis present

## 2021-11-22 DIAGNOSIS — M4856XA Collapsed vertebra, not elsewhere classified, lumbar region, initial encounter for fracture: Secondary | ICD-10-CM | POA: Diagnosis present

## 2021-11-22 DIAGNOSIS — Z8249 Family history of ischemic heart disease and other diseases of the circulatory system: Secondary | ICD-10-CM

## 2021-11-22 DIAGNOSIS — J9612 Chronic respiratory failure with hypercapnia: Secondary | ICD-10-CM | POA: Diagnosis present

## 2021-11-22 DIAGNOSIS — Z87891 Personal history of nicotine dependence: Secondary | ICD-10-CM

## 2021-11-22 DIAGNOSIS — Z7951 Long term (current) use of inhaled steroids: Secondary | ICD-10-CM

## 2021-11-22 HISTORY — DX: Chronic respiratory failure, unspecified whether with hypoxia or hypercapnia: J96.10

## 2021-11-22 HISTORY — DX: Chronic diastolic (congestive) heart failure: I50.32

## 2021-11-22 HISTORY — DX: Other ill-defined heart diseases: I51.89

## 2021-11-22 HISTORY — DX: Thoracic aortic aneurysm, without rupture, unspecified: I71.20

## 2021-11-22 LAB — COMPREHENSIVE METABOLIC PANEL
ALT: 11 U/L (ref 0–44)
AST: 18 U/L (ref 15–41)
Albumin: 3.8 g/dL (ref 3.5–5.0)
Alkaline Phosphatase: 67 U/L (ref 38–126)
Anion gap: 10 (ref 5–15)
BUN: 14 mg/dL (ref 8–23)
CO2: 27 mmol/L (ref 22–32)
Calcium: 8.8 mg/dL — ABNORMAL LOW (ref 8.9–10.3)
Chloride: 98 mmol/L (ref 98–111)
Creatinine, Ser: 0.59 mg/dL (ref 0.44–1.00)
GFR, Estimated: >60 mL/min (ref >60)
Glucose, Bld: 95 mg/dL (ref 70–99)
Potassium: 3.6 mmol/L (ref 3.5–5.1)
Sodium: 135 mmol/L (ref 135–145)
Total Bilirubin: 0.9 mg/dL (ref 0.3–1.2)
Total Protein: 6.9 g/dL (ref 6.5–8.1)

## 2021-11-22 LAB — CBC WITH DIFFERENTIAL/PLATELET
Abs Immature Granulocytes: 0.02 10*3/uL (ref 0.00–0.07)
Basophils Absolute: 0 10*3/uL (ref 0.0–0.1)
Basophils Relative: 0 %
Eosinophils Absolute: 0 10*3/uL (ref 0.0–0.5)
Eosinophils Relative: 0 %
HCT: 34.5 % — ABNORMAL LOW (ref 36.0–46.0)
Hemoglobin: 11.1 g/dL — ABNORMAL LOW (ref 12.0–15.0)
Immature Granulocytes: 0 %
Lymphocytes Relative: 11 %
Lymphs Abs: 1 10*3/uL (ref 0.7–4.0)
MCH: 30.3 pg (ref 26.0–34.0)
MCHC: 32.2 g/dL (ref 30.0–36.0)
MCV: 94.3 fL (ref 80.0–100.0)
Monocytes Absolute: 0.5 10*3/uL (ref 0.1–1.0)
Monocytes Relative: 5 %
Neutro Abs: 7.5 10*3/uL (ref 1.7–7.7)
Neutrophils Relative %: 84 %
Platelets: 230 10*3/uL (ref 150–400)
RBC: 3.66 MIL/uL — ABNORMAL LOW (ref 3.87–5.11)
RDW: 13.7 % (ref 11.5–15.5)
WBC: 8.9 10*3/uL (ref 4.0–10.5)
nRBC: 0 % (ref 0.0–0.2)

## 2021-11-22 LAB — HEPATIC FUNCTION PANEL
ALT: 13 U/L (ref 0–44)
AST: 19 U/L (ref 15–41)
Albumin: 3.9 g/dL (ref 3.5–5.0)
Alkaline Phosphatase: 68 U/L (ref 38–126)
Bilirubin, Direct: 0.1 mg/dL (ref 0.0–0.2)
Indirect Bilirubin: 0.7 mg/dL (ref 0.3–0.9)
Total Bilirubin: 0.8 mg/dL (ref 0.3–1.2)
Total Protein: 7.1 g/dL (ref 6.5–8.1)

## 2021-11-22 LAB — SARS CORONAVIRUS 2 BY RT PCR: SARS Coronavirus 2 by RT PCR: NEGATIVE

## 2021-11-22 LAB — RAPID URINE DRUG SCREEN, HOSP PERFORMED
Amphetamines: NOT DETECTED
Barbiturates: NOT DETECTED
Benzodiazepines: NOT DETECTED
Cocaine: NOT DETECTED
Opiates: POSITIVE — AB
Tetrahydrocannabinol: NOT DETECTED

## 2021-11-22 LAB — MAGNESIUM: Magnesium: 2.1 mg/dL (ref 1.7–2.4)

## 2021-11-22 LAB — ETHANOL: Alcohol, Ethyl (B): <10 mg/dL (ref <10)

## 2021-11-22 LAB — ACETAMINOPHEN LEVEL
Acetaminophen (Tylenol), Serum: 16 ug/mL (ref 10–30)
Acetaminophen (Tylenol), Serum: 12 ug/mL (ref 10–30)

## 2021-11-22 LAB — SALICYLATE LEVEL
Salicylate Lvl: 21.7 mg/dL (ref 7.0–30.0)
Salicylate Lvl: 18.6 mg/dL (ref 7.0–30.0)

## 2021-11-22 MED ORDER — RISPERIDONE 0.5 MG PO TABS
0.2500 mg | ORAL_TABLET | ORAL | Status: DC
  Administered 2021-11-23 – 2021-12-07 (×29): 0.25 mg via ORAL
  Filled 2021-11-22 (×30): qty 1

## 2021-11-22 MED ORDER — LACTATED RINGERS IV BOLUS
500.0000 mL | Freq: Once | INTRAVENOUS | Status: AC
  Administered 2021-11-22: 500 mL via INTRAVENOUS

## 2021-11-22 MED ORDER — PANTOPRAZOLE SODIUM 40 MG PO TBEC
40.0000 mg | DELAYED_RELEASE_TABLET | Freq: Every day | ORAL | Status: DC
  Administered 2021-11-23 – 2021-12-06 (×14): 40 mg via ORAL
  Filled 2021-11-22 (×14): qty 1

## 2021-11-22 MED ORDER — BUDESON-GLYCOPYRROL-FORMOTEROL 160-9-4.8 MCG/ACT IN AERO: 2.0000 | INHALATION_SPRAY | Freq: Two times a day (BID) | RESPIRATORY_TRACT | Status: DC

## 2021-11-22 MED ORDER — SPIRONOLACTONE 25 MG PO TABS
25.0000 mg | ORAL_TABLET | Freq: Every day | ORAL | Status: DC
  Administered 2021-11-23: 25 mg via ORAL
  Filled 2021-11-22: qty 1

## 2021-11-22 MED ORDER — VITAMIN D 25 MCG (1000 UNIT) PO TABS
2000.0000 [IU] | ORAL_TABLET | Freq: Every day | ORAL | Status: DC
  Administered 2021-11-23 – 2021-12-06 (×13): 2000 [IU] via ORAL
  Filled 2021-11-22 (×14): qty 2

## 2021-11-22 MED ORDER — FLUOXETINE HCL 20 MG PO CAPS
20.0000 mg | ORAL_CAPSULE | Freq: Every day | ORAL | Status: DC
  Administered 2021-11-23 – 2021-12-06 (×14): 20 mg via ORAL
  Filled 2021-11-22 (×14): qty 1

## 2021-11-22 MED ORDER — MIRTAZAPINE 15 MG PO TABS
15.0000 mg | ORAL_TABLET | Freq: Every day | ORAL | Status: DC
  Administered 2021-11-22 – 2021-12-06 (×15): 15 mg via ORAL
  Filled 2021-11-22 (×15): qty 1

## 2021-11-22 MED ORDER — LORAZEPAM 2 MG/ML IJ SOLN
1.0000 mg | Freq: Once | INTRAMUSCULAR | Status: AC
  Administered 2021-11-22: 1 mg via INTRAVENOUS
  Filled 2021-11-22: qty 1

## 2021-11-22 MED ORDER — UMECLIDINIUM BROMIDE 62.5 MCG/ACT IN AEPB
1.0000 | INHALATION_SPRAY | Freq: Every day | RESPIRATORY_TRACT | Status: DC
  Administered 2021-11-23: 1 via RESPIRATORY_TRACT
  Filled 2021-11-22 (×3): qty 7

## 2021-11-22 MED ORDER — ONDANSETRON HCL 4 MG/2ML IJ SOLN
4.0000 mg | Freq: Once | INTRAMUSCULAR | Status: AC
  Administered 2021-11-22: 4 mg via INTRAVENOUS
  Filled 2021-11-22: qty 2

## 2021-11-22 MED ORDER — GABAPENTIN 300 MG PO CAPS
300.0000 mg | ORAL_CAPSULE | Freq: Every day | ORAL | Status: DC
  Administered 2021-11-22 – 2021-12-06 (×14): 300 mg via ORAL
  Filled 2021-11-22 (×15): qty 1

## 2021-11-22 MED ORDER — AMLODIPINE BESYLATE 5 MG PO TABS
2.5000 mg | ORAL_TABLET | Freq: Every day | ORAL | Status: DC
  Administered 2021-11-23: 2.5 mg via ORAL
  Filled 2021-11-22: qty 1

## 2021-11-22 NOTE — ED Triage Notes (Signed)
Pt transported to ER from [place].  EMS reports patient has a suicide note dated 09/18/2021, and took Excedrin migraine 24 pills last night.  EMS reports seeing vomitus in trash can on scene.  Patient states she is in a lot of pain and wants to die

## 2021-11-22 NOTE — ED Notes (Signed)
spoke with Mateo Flow from poison control after she clarified with the toxicologist.  She said no tylenol for at least 48 hours, and just beware of pulmonary edema and they are closing the case now-MD made aware

## 2021-11-22 NOTE — ED Notes (Signed)
Tina Bailey from [place] control just called back and is asking for IV N-acetylcysteine '150mg'$ /kg over 1 hour and to repeat labs at 2426 AST, ALT, salicylate and tylenol   MD made aware

## 2021-11-22 NOTE — ED Notes (Signed)
Spoke with poison control and she stated pt is now out of the window due to pt taking the pills last night at 8pm  Pt will require cardiac monitoring and supportive care  CK, acetaminophen, CMP, magnesium and salicylate level   Recommends administering lactated ringers  Due to pt having a prolonged QTC she recommends a magnesium and potassium level drawn and making sure the levels be at the high end with a potassium of 4 and magnesium of 2  Dr. Melina Copa informed

## 2021-11-22 NOTE — ED Notes (Signed)
Spoke with Bryson Ha from poison control and she is recommending a repeat salicylate level and repeat EKG at 1900-MD made aware

## 2021-11-22 NOTE — ED Notes (Signed)
MD at bedside. 

## 2021-11-22 NOTE — ED Notes (Signed)
Patient is sticking her finger down her throat to try and make herself throw up. Notified RN

## 2021-11-22 NOTE — ED Notes (Addendum)
Patient repositioned in bed.

## 2021-11-22 NOTE — ED Notes (Signed)
Poison control contacted for medical clearance, they are reviewing the labs at this time and state they will call back

## 2021-11-22 NOTE — ED Provider Notes (Signed)
[facility] EMERGENCY DEPARTMENT Provider Note   CSN: [id number] Arrival date & time: 11/22/21  1433     History  Chief Complaint  Patient presents with   Suicide Attempt    Tina Bailey is a 76 y.o. female.  She has a history of liver cancer COPD chronic abdominal pain chronic back pain using narcotics.  She also has a history of significant depression.  She said she took 28 pills of Excedrin Migraine last night around 8 PM in an effort to kill herself.  She had multiple episodes of vomiting throughout the night.  She told her daughter this morning who called EMS and had her brought in.  She is endorsing her chronic abdominal pain and back pain.  She states she does not want to live anymore.  She denies any other coingestants.  The history is provided by the patient.  Ingestion This is a new problem. The current episode started 12 to 24 hours ago. The problem has not changed since onset.Associated symptoms include abdominal pain. Pertinent negatives include no chest pain, no headaches and no shortness of breath. Nothing aggravates the symptoms. Nothing relieves the symptoms. She has tried rest for the symptoms. The treatment provided no relief.       Home Medications Prior to Admission medications   Medication Sig Start Date End Date Taking? Authorizing Provider  albuterol (PROVENTIL) (2.5 MG/3ML) 0.083% nebulizer solution Take 3 mLs (2.5 mg total) by nebulization every 4 (four) hours as needed for wheezing or shortness of breath. 02/25/21   Tanda Rockers, MD  amLODipine (NORVASC) 5 MG tablet Take 1 tablet (5 mg total) by mouth daily. Patient taking differently: Take 2.5 mg by mouth daily. 07/17/21   Parks Ranger, DO  aspirin-acetaminophen-caffeine (EXCEDRIN MIGRAINE) [phone] MG tablet Take 1-2 tablets by mouth every 6 (six) hours as needed for headache.    [provider]  Budeson-Glycopyrrol-Formoterol (BREZTRI AEROSPHERE) 160-9-4.8 MCG/ACT AERO Take 2  puffs first thing in am and then another 2 puffs about 12 hours later. 08/23/21   Tanda Rockers, MD  Budeson-Glycopyrrol-Formoterol (BREZTRI AEROSPHERE) 160-9-4.8 MCG/ACT AERO Inhale 2 puffs into the lungs in the morning and at bedtime. 08/23/21   Tanda Rockers, MD  Cholecalciferol (VITAMIN D3) 50 MCG (2000 UT) capsule Take 2,000 Units by mouth daily after lunch. 04/05/21   [provider]  FLUoxetine (PROZAC) 20 MG capsule Take 1 capsule (20 mg total) by mouth daily. 07/17/21   Parks Ranger, DO  gabapentin (NEURONTIN) 300 MG capsule Take 1 capsule (300 mg total) by mouth at bedtime. 07/16/21   Parks Ranger, DO  HYDROcodone-acetaminophen [facility]) 10-325 MG tablet Take 1 tablet by mouth in the morning, at noon, in the evening, and at bedtime.    [provider]  melatonin 5 MG TABS Take 5 mg by mouth at bedtime as needed (sleep).    [provider]  mirtazapine (REMERON) 15 MG tablet Take 1 tablet (15 mg total) by mouth at bedtime. 07/16/21   Parks Ranger, DO  naproxen (NAPROSYN) 500 MG tablet Take 500 mg by mouth 2 (two) times daily with a meal.    [provider]  Plecanatide (TRULANCE) 3 MG TABS Take 3 mg by mouth daily. Sample    [provider]  Pseudoephedrine-Ibuprofen (ADVIL COLD & SINUS LIQUI-GELS PO) Take 1 capsule by mouth daily as needed (congestion & pain).    [provider]  risperiDONE (RISPERDAL) 0.25 MG tablet Take 1 tablet (  0.25 mg total) by mouth 2 (two) times daily at 8 am and 4 pm. Patient taking differently: Take 0.25 mg by mouth daily. At 4pm 07/16/21   Parks Ranger, DO      Allergies    Zoloft Diamond Nickel hcl], Amoxicillin-pot clavulanate, Clindamycin/lincomycin, Paxil [paroxetine hcl], and Sulfa antibiotics    Review of Systems   Review of Systems  Constitutional:  Negative for fever.  HENT:  Negative for sore throat.   Eyes:  Negative for visual disturbance.  Respiratory:   Negative for shortness of breath.   Cardiovascular:  Negative for chest pain.  Gastrointestinal:  Positive for abdominal pain.  Genitourinary:  Negative for dysuria.  Musculoskeletal:  Positive for back pain.  Skin:  Negative for rash.  Neurological:  Negative for headaches.    Physical Exam Updated Vital Signs BP (!) 127/49   Pulse 92   Temp 99.4 F (37.4 C) (Oral)   Resp (!) 22   Wt 59.4 kg   SpO2 95%   BMI 25.58 kg/m  Physical Exam Vitals and nursing note reviewed.  Constitutional:      General: She is not in acute distress.    Appearance: Normal appearance. She is well-developed.  HENT:     Head: Normocephalic and atraumatic.  Eyes:     Conjunctiva/sclera: Conjunctivae normal.  Cardiovascular:     Rate and Rhythm: Normal rate and regular rhythm.     Heart sounds: No murmur heard. Pulmonary:     Effort: Pulmonary effort is normal. No respiratory distress.     Breath sounds: Normal breath sounds. No stridor. No wheezing.  Abdominal:     Palpations: Abdomen is soft.     Tenderness: There is no abdominal tenderness. There is no guarding or rebound.  Musculoskeletal:        General: No tenderness or deformity. Normal range of motion.     Cervical back: Neck supple.  Skin:    General: Skin is warm and dry.  Neurological:     General: No focal deficit present.     Mental Status: She is alert.     GCS: GCS eye subscore is 4. GCS verbal subscore is 5. GCS motor subscore is 6.     Sensory: No sensory deficit.     Motor: No weakness.     ED Results / Procedures / Treatments   Labs (all labs ordered are listed, but only abnormal results are displayed) Labs Reviewed  COMPREHENSIVE METABOLIC PANEL - Abnormal; Notable for the following components:      Result Value   Calcium 8.8 (*)    All other components within normal limits  RAPID URINE DRUG SCREEN, HOSP PERFORMED - Abnormal; Notable for the following components:   Opiates POSITIVE (*)    All other components  within normal limits  CBC WITH DIFFERENTIAL/PLATELET - Abnormal; Notable for the following components:   RBC 3.66 (*)    Hemoglobin 11.1 (*)    HCT 34.5 (*)    All other components within normal limits  SARS CORONAVIRUS 2 BY RT PCR  ETHANOL  ACETAMINOPHEN LEVEL  SALICYLATE LEVEL  MAGNESIUM  ACETAMINOPHEN LEVEL  SALICYLATE LEVEL  HEPATIC FUNCTION PANEL    EKG EKG Interpretation  Date/Time:  Monday November 22 2021 15:06:10 EDT Ventricular Rate:  90 PR Interval:  126 QRS Duration: 88 QT Interval:  417 QTC Calculation: 511 R Axis:   97 Text Interpretation: Sinus rhythm Right axis deviation Probable anteroseptal infarct, old Nonspecific T abnormalities, lateral leads Prolonged  QT interval Artifact in lead(s) I II III aVR aVL increased QT from prior 5/23 Confirmed by Aletta Edouard [phone]) on 11/22/2021 3:52:38 PM  Radiology No results found.  Procedures Procedures    Medications Ordered in ED Medications  amLODipine (NORVASC) tablet 2.5 mg (has no administration in time range)  Budeson-Glycopyrrol-Formoterol 160-9-4.8 MCG/ACT AERO 2 puff (has no administration in time range)  Vitamin D3 2,000 Units (has no administration in time range)  FLUoxetine (PROZAC) capsule 20 mg (has no administration in time range)  gabapentin (NEURONTIN) capsule 300 mg (300 mg Oral Given 11/22/21 2238)  umeclidinium bromide (INCRUSE ELLIPTA) 62.5 MCG/ACT 1 puff (has no administration in time range)  mirtazapine (REMERON) tablet 15 mg (15 mg Oral Given 11/22/21 2238)  pantoprazole (PROTONIX) EC tablet 40 mg (has no administration in time range)  risperiDONE (RISPERDAL) tablet 0.25 mg (has no administration in time range)  spironolactone (ALDACTONE) tablet 25 mg (has no administration in time range)  lactated ringers bolus 500 mL (0 mLs Intravenous Stopped 11/22/21 1709)  ondansetron (ZOFRAN) injection 4 mg (4 mg Intravenous Given 11/22/21 1816)  LORazepam (ATIVAN) injection 1 mg (1 mg Intravenous Given  11/22/21 2132)    ED Course/ Medical Decision Making/ A&P Clinical Course as of 11/23/21 1024  Mon Nov 22, 2021  1604 Nursing was able to reach out to poison control.  They felt that the dosage of what she took was likely nonlethal.  Need to check levels and follow magnesium and potassium for her prolonged QT.  Supportive treatment otherwise. [MB]  [place] control now asking for 7 PM aspirin Tylenol repeats along with AST and ALT.  Consideration for NAC [MB]  2216 Poison control cleared case.  They are recommending no Tylenol for 48 hours.  Patient is medically cleared.  We will order her home meds and put in for TTS consult. [MB]  2307 Have not placed patient under IVC because she has been cooperative currently.  I do feel if she were to wish to leave that she has demonstrated that an IVC would be warranted. [MB]    Clinical Course User Index [MB] Hayden Rasmussen, MD                           Medical Decision Making Amount and/or Complexity of Data Reviewed Labs: ordered.  Risk OTC drugs. Prescription drug management.   This patient complains of overdose suicide attempt; this involves an extensive number of treatment Options and is a complaint that carries with it a high risk of complications and morbidity. The differential includes overdose, Tylenol toxicity, salicylate toxicity  I ordered, reviewed and interpreted labs, which included CBC with normal white count, hemoglobin slightly lower than priors, chemistries normal, LFTs normal, tox positive for opiates which she is on, COVID-negative, aspirin and Tylenol detectable.  Aspirin and Tylenol repeated along with LFTs which show her levels are decreasing. I ordered medication IV Ativan IV Zofran IV fluids and reviewed PMP when indicated. Additional history obtained from EMS Previous records obtained and reviewed in epic prior history of mental health visits I consulted poison control and TTS and discussed lab and imaging  findings and discussed disposition.  Cardiac monitoring reviewed, normal sinus rhythm Social determinants considered, no significant barriers Critical Interventions: None  After the interventions stated above, I reevaluated the patient and found patient to be intermittently agitated but otherwise asymptomatic Admission and further testing considered, she will need further evaluation by psychiatry.  Disposition  per psychiatry recommendations.         Final Clinical Impression(s) / ED Diagnoses Final diagnoses:  Intentional overdose, initial encounter [facility])  Suicidal ideation    Rx / DC Orders ED Discharge Orders     None         Hayden Rasmussen, MD 11/23/21 1024

## 2021-11-23 MED ORDER — MOMETASONE FURO-FORMOTEROL FUM 100-5 MCG/ACT IN AERO
2.0000 | INHALATION_SPRAY | Freq: Two times a day (BID) | RESPIRATORY_TRACT | Status: DC
Start: 2021-11-23 — End: 2021-12-07
  Administered 2021-11-24 – 2021-12-06 (×25): 2 via RESPIRATORY_TRACT
  Filled 2021-11-23 (×2): qty 8.8

## 2021-11-23 MED ORDER — OXYCODONE HCL 5 MG PO TABS
5.0000 mg | ORAL_TABLET | Freq: Four times a day (QID) | ORAL | Status: DC | PRN
  Administered 2021-11-23 – 2021-11-24 (×3): 5 mg via ORAL
  Filled 2021-11-23 (×3): qty 1

## 2021-11-23 NOTE — Progress Notes (Signed)
Inpatient Behavioral Health Placement   Pt meets inpatient criteria per Garrison [place], NP. Referral was sent to the following facilities;   Destination Service Provider Address Phone Fax  [facility]  [address] [place] [address] [phone]  [place]  [address] [place] [address] [phone] [facility]  [place], [place] [address] [phone]  [facility]  [place], [place] [address] [phone]  [facility]  [address] [place] [address] [place]  [facility]  [address], [place] [address] [phone]  [address] [place] [address] [phone]  [facility]  [address] [place] [address] [phone] [place]  [address], [place] [address] [phone] [place]  [address], [place] [address] [phone]  [facility]  [place], [place] [address] [phone]  [place] [address] [place] [address] [phone]  [facility]  [place] [address] [phone]  [place], [place] [address] [phone] [facility]  [place], [place] [address] [phone] [facility]  [address] [place] [address] [phone]    Situation ongoing,  CSW will follow up.   Benjaman Kindler, MSW, [facility] 11/23/2021  @ 11:48 PM

## 2021-11-23 NOTE — ED Notes (Signed)
Pt refused to cooperate with [facility] Counselor during TTS Consult. Pt kept pretending to Nod Off and sleep when questioned by the Counselor, but would respond and answer all questions by this RN at bedside during consult. Pt then reported she was just wanting to sleep and do the consult later.

## 2021-11-23 NOTE — BH Assessment (Signed)
Clinician messaged Jacqulynn Cadet D. Sappelt, RN: "Hey. It's Tina Bailey with TTS. Is the pt able to engage in the assessment, if so the pt will need to be placed in a private room. Is the pt under IVC? Also is the pt medically cleared?"  Clinician awaiting response.    Vertell Novak, [place], [facility], [facility] Triage Specialist [phone]

## 2021-11-23 NOTE — Progress Notes (Addendum)
Inpatient Behavioral Health Placement  Pt meets inpatient criteria per Garrison [place], NP. At the direction of Dr. Dwyane Dee pt is to be faxed out. CSW inquired with [facility] for review.  Referral was sent to the following facilities;   Destination Service Provider Address Phone Fax Patient Preferred  [facility]  [address], [place] [address] [phone] --  [address]  [address] [place] [address] [place] --  [facility]  [place], [place] [address] [phone] --  [facility]  [place], [place] [address] [phone] --  [facility]  [address], [place] [address] [phone] --  [facility]  [address], [place] [address] [phone] --  [place] [address], [place] [address] [phone] --  [facility]  [address] [place] [address] [phone] --  [facility]  [address] [place] [address] [phone] --  [place]  [address] [place] [address] [phone]     Situation ongoing,  CSW will follow up.   Benjaman Kindler, MSW, LCSWA 11/23/2021  @ 2:23 PM

## 2021-11-23 NOTE — ED Notes (Signed)
Pt

## 2021-11-23 NOTE — ED Notes (Signed)
Pt assisted to bedside commode. Pt provided clean incontinence brief, and bed linen changed.

## 2021-11-23 NOTE — ED Notes (Signed)
TTS Cart moved to bedside

## 2021-11-23 NOTE — ED Notes (Signed)
Pt making multiple attempts to slide herself off the bed. Pt needing frequent redirection

## 2021-11-23 NOTE — ED Provider Notes (Signed)
Emergency Medicine Observation Re-evaluation Note  Tina Bailey is a 76 y.o. female, seen on rounds today.  Pt initially presented to the ED for complaints of Suicide Attempt Currently, the patient is asleep.  Pt presented yesterday for an overdose of excedrin migraine in an attempt to kill herself.  OD occurred on 9/3 and she told her family on 9/4.  Pt has been medically cleared.  No tylenol recommended for 48 hrs.  Pt is not under IVC as she's been cooperative.  However, if she tries to leave, this may need to be done.  She is currently asleep. Physical Exam  BP (!) 140/81   Pulse (!) 110   Temp 98.4 F (36.9 C) (Oral)   Resp 20   Wt 59.4 kg   SpO2 98%   BMI 25.58 kg/m  Physical Exam General: asleep Cardiac: rr Lungs: breathing well Psych: asleep  ED Course / MDM  EKG:EKG Interpretation  Date/Time:  Monday November 22 2021 15:06:10 EDT Ventricular Rate:  90 PR Interval:  126 QRS Duration: 88 QT Interval:  417 QTC Calculation: 511 R Axis:   97 Text Interpretation: Sinus rhythm Right axis deviation Probable anteroseptal infarct, old Nonspecific T abnormalities, lateral leads Prolonged QT interval Artifact in lead(s) I II III aVR aVL increased QT from prior 5/23 Confirmed by Aletta Edouard [phone]) on 11/22/2021 3:52:38 PM  I have reviewed the labs performed to date as well as medications administered while in observation.  Recent changes in the last 24 hours include pt is medically cleared.  TTS attempted to eval, but pt was too somnolent.  Diet and home meds have been ordered.  Plan  Current plan is for TTS eval for suicide attempt.    Tina Pence, MD 11/23/21 [phone]

## 2021-11-23 NOTE — ED Notes (Signed)
Ed

## 2021-11-23 NOTE — BH Assessment (Signed)
Clinician attempted to engage the pt in TTS assessment however the pt would mouth or whisper her responses. Pt's RN Jacqulynn Cadet) encouraged the pt to engage however she would dosed off, when roused she would continue to whisper and mouth her responses. Clinician observed RN discussing the important of completing the assessment however pt did not engage.   Jacqulynn Cadet, RN recommends pt's assessment to be completed during day shift.     Vertell Novak, [place], [facility], [facility] Triage Specialist [phone]

## 2021-11-23 NOTE — ED Notes (Signed)
TTS in Progress
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
## 2021-11-23 NOTE — ED Notes (Signed)
Pt received dinner tray.

## 2021-11-24 ENCOUNTER — Observation Stay (HOSPITAL_COMMUNITY): Payer: Medicare HMO

## 2021-11-24 ENCOUNTER — Encounter (HOSPITAL_COMMUNITY): Payer: Self-pay | Admitting: Internal Medicine

## 2021-11-24 DIAGNOSIS — I272 Pulmonary hypertension, unspecified: Secondary | ICD-10-CM | POA: Diagnosis present

## 2021-11-24 DIAGNOSIS — T50902A Poisoning by unspecified drugs, medicaments and biological substances, intentional self-harm, initial encounter: Secondary | ICD-10-CM | POA: Diagnosis not present

## 2021-11-24 DIAGNOSIS — Z8249 Family history of ischemic heart disease and other diseases of the circulatory system: Secondary | ICD-10-CM | POA: Diagnosis not present

## 2021-11-24 DIAGNOSIS — J438 Other emphysema: Secondary | ICD-10-CM | POA: Diagnosis not present

## 2021-11-24 DIAGNOSIS — Z888 Allergy status to other drugs, medicaments and biological substances status: Secondary | ICD-10-CM | POA: Diagnosis not present

## 2021-11-24 DIAGNOSIS — J9612 Chronic respiratory failure with hypercapnia: Secondary | ICD-10-CM

## 2021-11-24 DIAGNOSIS — Z20822 Contact with and (suspected) exposure to covid-19: Secondary | ICD-10-CM | POA: Diagnosis present

## 2021-11-24 DIAGNOSIS — Z8505 Personal history of malignant neoplasm of liver: Secondary | ICD-10-CM | POA: Diagnosis not present

## 2021-11-24 DIAGNOSIS — Z9981 Dependence on supplemental oxygen: Secondary | ICD-10-CM | POA: Diagnosis not present

## 2021-11-24 DIAGNOSIS — M4856XA Collapsed vertebra, not elsewhere classified, lumbar region, initial encounter for fracture: Secondary | ICD-10-CM | POA: Diagnosis present

## 2021-11-24 DIAGNOSIS — Z79899 Other long term (current) drug therapy: Secondary | ICD-10-CM | POA: Diagnosis not present

## 2021-11-24 DIAGNOSIS — C220 Liver cell carcinoma: Secondary | ICD-10-CM

## 2021-11-24 DIAGNOSIS — F418 Other specified anxiety disorders: Secondary | ICD-10-CM

## 2021-11-24 DIAGNOSIS — R45851 Suicidal ideations: Secondary | ICD-10-CM | POA: Diagnosis present

## 2021-11-24 DIAGNOSIS — F1120 Opioid dependence, uncomplicated: Secondary | ICD-10-CM | POA: Diagnosis present

## 2021-11-24 DIAGNOSIS — R7302 Impaired glucose tolerance (oral): Secondary | ICD-10-CM | POA: Diagnosis present

## 2021-11-24 DIAGNOSIS — J449 Chronic obstructive pulmonary disease, unspecified: Secondary | ICD-10-CM | POA: Diagnosis present

## 2021-11-24 DIAGNOSIS — T391X2A Poisoning by 4-Aminophenol derivatives, intentional self-harm, initial encounter: Secondary | ICD-10-CM | POA: Diagnosis present

## 2021-11-24 DIAGNOSIS — F332 Major depressive disorder, recurrent severe without psychotic features: Secondary | ICD-10-CM | POA: Diagnosis present

## 2021-11-24 DIAGNOSIS — I4891 Unspecified atrial fibrillation: Secondary | ICD-10-CM | POA: Diagnosis not present

## 2021-11-24 DIAGNOSIS — I110 Hypertensive heart disease with heart failure: Secondary | ICD-10-CM | POA: Diagnosis present

## 2021-11-24 DIAGNOSIS — G8929 Other chronic pain: Secondary | ICD-10-CM | POA: Diagnosis present

## 2021-11-24 DIAGNOSIS — M797 Fibromyalgia: Secondary | ICD-10-CM | POA: Diagnosis present

## 2021-11-24 DIAGNOSIS — I10 Essential (primary) hypertension: Secondary | ICD-10-CM | POA: Diagnosis not present

## 2021-11-24 DIAGNOSIS — T50902D Poisoning by unspecified drugs, medicaments and biological substances, intentional self-harm, subsequent encounter: Secondary | ICD-10-CM | POA: Diagnosis not present

## 2021-11-24 DIAGNOSIS — I959 Hypotension, unspecified: Secondary | ICD-10-CM | POA: Diagnosis present

## 2021-11-24 DIAGNOSIS — Z880 Allergy status to penicillin: Secondary | ICD-10-CM | POA: Diagnosis not present

## 2021-11-24 DIAGNOSIS — I5032 Chronic diastolic (congestive) heart failure: Secondary | ICD-10-CM | POA: Diagnosis present

## 2021-11-24 DIAGNOSIS — F419 Anxiety disorder, unspecified: Secondary | ICD-10-CM | POA: Diagnosis present

## 2021-11-24 DIAGNOSIS — J9611 Chronic respiratory failure with hypoxia: Secondary | ICD-10-CM | POA: Diagnosis present

## 2021-11-24 DIAGNOSIS — Z66 Do not resuscitate: Secondary | ICD-10-CM | POA: Diagnosis present

## 2021-11-24 LAB — CBC
HCT: 39.6 % (ref 36.0–46.0)
Hemoglobin: 12.3 g/dL (ref 12.0–15.0)
MCH: 30.2 pg (ref 26.0–34.0)
MCHC: 31.1 g/dL (ref 30.0–36.0)
MCV: 97.3 fL (ref 80.0–100.0)
Platelets: 228 10*3/uL (ref 150–400)
RBC: 4.07 MIL/uL (ref 3.87–5.11)
RDW: 14 % (ref 11.5–15.5)
WBC: 7.1 10*3/uL (ref 4.0–10.5)
nRBC: 0 % (ref 0.0–0.2)

## 2021-11-24 LAB — ECHOCARDIOGRAM COMPLETE
AR max vel: 2.3 cm2
AV Area VTI: 2.57 cm2
AV Area mean vel: 2.29 cm2
AV Mean grad: 3.5 mmHg
AV Peak grad: 8.1 mmHg
Ao pk vel: 1.42 m/s
Area-P 1/2: 4.29 cm2
Height: 60 in
MV VTI: 2.22 cm2
S' Lateral: 2.9 cm
Weight: 1943.58 oz

## 2021-11-24 LAB — BASIC METABOLIC PANEL
Anion gap: 9 (ref 5–15)
BUN: 18 mg/dL (ref 8–23)
CO2: 27 mmol/L (ref 22–32)
Calcium: 8.8 mg/dL — ABNORMAL LOW (ref 8.9–10.3)
Chloride: 101 mmol/L (ref 98–111)
Creatinine, Ser: 0.64 mg/dL (ref 0.44–1.00)
GFR, Estimated: >60 mL/min (ref >60)
Glucose, Bld: 105 mg/dL — ABNORMAL HIGH (ref 70–99)
Potassium: 3.9 mmol/L (ref 3.5–5.1)
Sodium: 137 mmol/L (ref 135–145)

## 2021-11-24 LAB — TSH: TSH: 0.575 u[IU]/mL (ref 0.350–4.500)

## 2021-11-24 LAB — PROTIME-INR
INR: 1.1 (ref 0.8–1.2)
Prothrombin Time: 13.7 seconds (ref 11.4–15.2)

## 2021-11-24 LAB — MRSA NEXT GEN BY PCR, NASAL: MRSA by PCR Next Gen: NOT DETECTED

## 2021-11-24 LAB — MAGNESIUM: Magnesium: 2.1 mg/dL (ref 1.7–2.4)

## 2021-11-24 MED ORDER — DILTIAZEM HCL-DEXTROSE 125-5 MG/125ML-% IV SOLN (PREMIX)
5.0000 mg/h | INTRAVENOUS | Status: DC
  Filled 2021-11-24: qty 125

## 2021-11-24 MED ORDER — ACETAMINOPHEN 650 MG RE SUPP: 650.0000 mg | Freq: Four times a day (QID) | RECTAL | Status: DC | PRN

## 2021-11-24 MED ORDER — ADULT MULTIVITAMIN W/MINERALS CH
1.0000 | ORAL_TABLET | Freq: Every day | ORAL | Status: DC
  Administered 2021-11-24 – 2021-12-06 (×11): 1 via ORAL
  Filled 2021-11-24 (×13): qty 1

## 2021-11-24 MED ORDER — POTASSIUM CHLORIDE CRYS ER 20 MEQ PO TBCR
40.0000 meq | EXTENDED_RELEASE_TABLET | Freq: Once | ORAL | Status: AC
  Administered 2021-11-24: 40 meq via ORAL
  Filled 2021-11-24: qty 2

## 2021-11-24 MED ORDER — DILTIAZEM LOAD VIA INFUSION
5.0000 mg | Freq: Once | INTRAVENOUS | Status: AC
  Administered 2021-11-24: 5 mg via INTRAVENOUS
  Filled 2021-11-24: qty 5

## 2021-11-24 MED ORDER — METOPROLOL TARTRATE 25 MG PO TABS
25.0000 mg | ORAL_TABLET | Freq: Three times a day (TID) | ORAL | Status: DC
  Administered 2021-11-24: 25 mg via ORAL
  Filled 2021-11-24: qty 1

## 2021-11-24 MED ORDER — SENNA 8.6 MG PO TABS
1.0000 | ORAL_TABLET | Freq: Every day | ORAL | Status: DC
  Administered 2021-11-24 – 2021-11-26 (×3): 8.6 mg via ORAL
  Filled 2021-11-24 (×3): qty 1

## 2021-11-24 MED ORDER — MIDODRINE HCL 5 MG PO TABS
2.5000 mg | ORAL_TABLET | Freq: Three times a day (TID) | ORAL | Status: DC
  Administered 2021-11-24 (×2): 2.5 mg via ORAL
  Filled 2021-11-24 (×2): qty 1

## 2021-11-24 MED ORDER — ACETAMINOPHEN 325 MG PO TABS
650.0000 mg | ORAL_TABLET | Freq: Four times a day (QID) | ORAL | Status: DC | PRN
  Administered 2021-11-26 – 2021-11-29 (×4): 650 mg via ORAL
  Filled 2021-11-24 (×4): qty 2

## 2021-11-24 MED ORDER — IPRATROPIUM-ALBUTEROL 0.5-2.5 (3) MG/3ML IN SOLN: 3.0000 mL | Freq: Four times a day (QID) | RESPIRATORY_TRACT | Status: DC | PRN

## 2021-11-24 MED ORDER — DIGOXIN 0.25 MG/ML IJ SOLN: 0.2500 mg | Freq: Four times a day (QID) | INTRAMUSCULAR | Status: DC

## 2021-11-24 MED ORDER — DILTIAZEM LOAD VIA INFUSION
10.0000 mg | Freq: Once | INTRAVENOUS | Status: DC
  Filled 2021-11-24: qty 10

## 2021-11-24 MED ORDER — HYDROCODONE-ACETAMINOPHEN 10-325 MG PO TABS
1.0000 | ORAL_TABLET | Freq: Four times a day (QID) | ORAL | Status: DC | PRN
  Administered 2021-11-24 – 2021-11-27 (×9): 1 via ORAL
  Filled 2021-11-24 (×9): qty 1

## 2021-11-24 MED ORDER — ENOXAPARIN SODIUM 40 MG/0.4ML IJ SOSY
40.0000 mg | PREFILLED_SYRINGE | INTRAMUSCULAR | Status: DC
  Administered 2021-11-24 – 2021-12-06 (×10): 40 mg via SUBCUTANEOUS
  Filled 2021-11-24 (×12): qty 0.4

## 2021-11-24 MED ORDER — DILTIAZEM HCL-DEXTROSE 125-5 MG/125ML-% IV SOLN (PREMIX)
5.0000 mg/h | INTRAVENOUS | Status: DC
  Administered 2021-11-24 (×3): 5 mg/h via INTRAVENOUS

## 2021-11-24 MED ORDER — CHLORHEXIDINE GLUCONATE CLOTH 2 % EX PADS
6.0000 | MEDICATED_PAD | Freq: Every day | CUTANEOUS | Status: DC
  Administered 2021-11-24 – 2021-11-25 (×2): 6 via TOPICAL

## 2021-11-24 MED ORDER — ONDANSETRON HCL 4 MG/2ML IJ SOLN
4.0000 mg | Freq: Four times a day (QID) | INTRAMUSCULAR | Status: DC | PRN
  Filled 2021-11-24: qty 2

## 2021-11-24 MED ORDER — DILTIAZEM HCL 25 MG/5ML IV SOLN: 5.0000 mg | Freq: Once | INTRAVENOUS | Status: DC

## 2021-11-24 MED ORDER — ONDANSETRON HCL 4 MG/2ML IJ SOLN
4.0000 mg | Freq: Once | INTRAMUSCULAR | Status: AC
  Administered 2021-11-24: 4 mg via INTRAVENOUS
  Filled 2021-11-24: qty 2

## 2021-11-24 MED ORDER — ONDANSETRON HCL 4 MG PO TABS
4.0000 mg | ORAL_TABLET | Freq: Four times a day (QID) | ORAL | Status: DC | PRN
  Administered 2021-11-28 – 2021-11-29 (×2): 4 mg via ORAL
  Filled 2021-11-24 (×2): qty 1

## 2021-11-24 NOTE — Assessment & Plan Note (Addendum)
PDMP reviewed --norco 10/325 #120 monthly --continue norco

## 2021-11-24 NOTE — Assessment & Plan Note (Addendum)
Tobacco cessation discussed 

## 2021-11-24 NOTE — ED Notes (Signed)
Denies CP or SOB. Mentions HA and chronic back pain. No dyspnea noted. Resting on side. Repeat cardizem bolus given. SBP dropped from 119 to 88, will continue to monitor. BP may be related to positioning on side. Card PA at [facility].

## 2021-11-24 NOTE — ED Notes (Addendum)
Sudden onset afib RVR HR 170, fast HR noted on monitor prior to 0700, pt alert, c/o feeling cold and nausea. Awake, interactive, answers questions.

## 2021-11-24 NOTE — ED Provider Notes (Signed)
Emergency Medicine Observation Re-evaluation Note  Tina Bailey is a 76 y.o. female, seen on rounds today.  Pt initially presented to the ED for complaints of Suicide Attempt Currently, the patient is lying in bed.  Physical Exam  BP 97/61 (BP Location: Left Arm)   Pulse 88   Temp 98.1 F (36.7 C) (Oral)   Resp 15   Wt 59.4 kg   SpO2 94%   BMI 25.58 kg/m  Physical Exam General: Awake, alert, endorsing chronic abdominal pain Cardiac: Tachycardic, irregularly irregular rhythm Lungs: Breathing is unlabored Psych: Continues to endorse SI.  Not responding to internal stimuli  ED Course / MDM  EKG:EKG Interpretation  Date/Time:  Monday November 22 2021 15:06:10 EDT Ventricular Rate:  90 PR Interval:  126 QRS Duration: 88 QT Interval:  417 QTC Calculation: 511 R Axis:   97 Text Interpretation: Sinus rhythm Right axis deviation Probable anteroseptal infarct, old Nonspecific T abnormalities, lateral leads Prolonged QT interval Artifact in lead(s) I II III aVR aVL increased QT from prior 5/23 Confirmed by Aletta Edouard [phone]) on 11/22/2021 3:52:38 PM  I have reviewed the labs performed to date as well as medications administered while in observation.  Recent changes in the last 24 hours include none.  Plan  Current plan is for Geri-psych admission.  At 7:20 AM, patient was found to be in atrial fibrillation.  Per chart review, she does not have a history of this.  Per cardiac monitor data, it does appear that she went into an atrial fibrillation with RVR rhythm at approximately 6:20 AM.  Data goes back 24 hours and it appears that she was in sinus rhythm throughout that time up until this morning.  On assessment, patient denies any new chest pain, shortness of breath, or palpitations.  Current heart rate is in the range of 170.  Per chart review, she is followed by cardiology.  In 2019, she had an echocardiogram that showed a left atrial mass.  This appears to have resolved on  recent echocardiogram in May.  Echocardiogram in May also showed mild diastolic dysfunction with EF of 65 to 70% and no atrial dilatation.  There was mild tricuspid and mitral regurgitation with trace aortic valve regurgitation and trace pericardial effusion.  With her rapid heart rate, patient had soft blood pressures in the range of 100s over 80s.  5 mg bolus of diltiazem was ordered and patient was initiated on diltiazem gtt.  Following this, she had improvement of heart rate into the 150s.  She remained normotensive.  Patient to receive an additional 5 mg bolus of diltiazem.  Given her history of liver disease, will await INR results prior to initiation of anticoagulation.  I discussed with cardiologist on-call, Dr. Harl Bowie, who will follow the patient in consult.  Patient to be admitted to hospitalist.    Godfrey Pick, MD 11/24/21 [phone]

## 2021-11-24 NOTE — ED Notes (Signed)
BP improved, pt rolled over onto her side, BP reading lower, will monitor

## 2021-11-24 NOTE — H&P (Signed)
History and Physical    Patient: Tina Bailey FIE:[id number] DOB: [birth year] DOA: 11/22/2021 DOS: the patient was seen and examined on 11/24/2021 PCP: Vilinda Boehringer, NP  Patient coming from: ALF/ILF  Chief Complaint:  Chief Complaint  Patient presents with   Suicide Attempt   HPI: Tina Bailey is a 76 year old female with a history of COPD, hepatocellular carcinoma, opioid dependence with chronic abdominal and chronic back pain, depression, tobacco abuse, hypertension, chronic respiratory failure on 2 L presenting with suicide attempt.  The patient presented to the ED on 11/22/2021 after taking 28 Excedrin on the evening of 11/21/2021.  She began having nausea and vomiting throughout the night from 11/21/2021 to 11/22/2021.  She notified her daughter on the morning of 11/22/2021 and she was brought to the emergency department for further evaluation.  [facility]  The patient was evaluated by psychiatry who recommended inpatient evaluation and treatment.  While waiting for an inpatient Geri psychiatric bed, the patient developed atrial fibrillation with RVR on the morning of 11/24/2021 with heart rate in the 170s.  She was given a diltiazem bolus, 5 mg and then started on a drip.  Admission was requested. The patient denies any fever, chills, headache, chest pain, shortness breath, vomiting, diarrhea.  She has chronic nausea.  She has not had a bowel movement.  There is no hematochezia or melena.  She denies any coughing or hemoptysis. In the ED, the patient was afebrile hemodynamically stable albeit with soft blood pressures after starting diltiazem.  Oxygen saturation is 96% on 2 L.  WBC 7.1, hemoglobin 12.3, platelets 228,000.  BMP showed sodium 137, potassium 3.9, bicarbonate 27, serum creatinine 0.64.  EKG showed atrial fibrillation with nonspecific ST-T wave change.  Urine drug screen was positive for opiates.  Cardiology was consulted to assist with management.  Review  of Systems: As mentioned in the history of present illness. All other systems reviewed and are negative. Past Medical History:  Diagnosis Date   Arthritis    DDD. Right shoulder"is frozen"-limited ROM. osteoporosis.   Blue toe syndrome ([place]) 11/27/2014   Dr. Claudia Pollock evaluating   Chronic diastolic CHF (congestive heart failure) (HCC)    Chronic respiratory failure (HCC)    Complication of anesthesia    COPD (chronic obstructive pulmonary disease) ([place])    Fibromyalgia    Fracture of rib of right side    hx "osteoporosis"- states her dog nudge her on the side, next day developed great pain and was told has a fracture rib.   Liver cancer ([place]) 12/2014   Macular degeneration    R eye   Osteoporosis    PONV (postoperative nausea and vomiting)    nausea,severe vomiting after 01-13-15 portal vein embolization   Productive cough    Retina disorder    L eye, vision distorted, edema   Right atrial mass    Spine fracture due to birth trauma    Thoracic aortic aneurysm (TAA) ([place])    TMJ disease    Wears glasses    Past Surgical History:  Procedure Laterality Date   ANGIOPLASTY  2008   no stents required, no follow-up with cardiologist, no recurrent chest pain   CATARACT EXTRACTION, BILATERAL Bilateral    ESOPHAGOGASTRODUODENOSCOPY (EGD) WITH PROPOFOL N/A 06/25/2015   Procedure: ESOPHAGOGASTRODUODENOSCOPY (EGD) WITH PROPOFOL;  Surgeon: Milus Banister, MD;  Location: [facility];  Service: Endoscopy;  Laterality: N/A;  stent removal    EYE SURGERY     cornea surgery  INTRAMEDULLARY (IM) NAIL INTERTROCHANTERIC Right 12/04/2017   Procedure: INTRAMEDULLARY (IM) NAIL INTERTROCHANTRIC;  Surgeon: Rod Can, MD;  Location: [facility];  Service: Orthopedics;  Laterality: Right;   IR RADIOLOGIST EVAL & MGMT  03/29/2017   KIDNEY STONE SURGERY     LAPAROSCOPIC PARTIAL HEPATECTOMY N/A 03/04/2015   Procedure: DIAGNOSTIC LAPAROSCOPY, EXTENDED RIGHT HEPATECTOMY, WITH INTRAOPERATIVE ULTRASOUND;   Surgeon: Stark Klein, MD;  Location: [facility];  Service: General;  Laterality: N/A;   PARTIAL HYSTERECTOMY     portal vein embolization     01-13-15 -Dr. Cathlean Sauer.   Social History:  reports that she quit smoking about 3 years ago. Her smoking use included cigarettes. She has a 45.00 pack-year smoking history. She has never used smokeless tobacco. She reports that she does not drink alcohol and does not use drugs.  Allergies  Allergen Reactions   Zoloft [Sertraline Hcl] Other (See Comments)    Zoloft, had a terrible reaction   Amoxicillin-Pot Clavulanate Nausea Only    Amoxacillin Augmentin combo  Has patient had a PCN reaction causing immediate rash, facial/tongue/throat swelling, SOB or lightheadedness with hypotension: No Has patient had a PCN reaction causing severe rash involving mucus membranes or skin necrosis: No Has patient had a PCN reaction that required hospitalization No Has patient had a PCN reaction occurring within the last 10 years: No If all of the above answers are "NO", then may proceed with Cephalosporin use.    Clindamycin/Lincomycin Other (See Comments)    Felt like it "burned out" her stomach/took a large dose   Paxil [Paroxetine Hcl] Rash   Sulfa Antibiotics Rash    Family History  Problem Relation Age of Onset   Hypertension Mother    Atrial fibrillation Mother    Heart disease Mother        after age 20   Breast cancer Mother    Stroke Father    Dementia Father    Peptic Ulcer Father    Heart disease Brother        After age 63- A-Fib   Heart attack Brother     Prior to Admission medications   Medication Sig Start Date End Date Taking? Authorizing Provider  amLODipine (NORVASC) 5 MG tablet Take 1 tablet (5 mg total) by mouth daily. 07/17/21  Yes Parks Ranger, DO  Budeson-Glycopyrrol-Formoterol (BREZTRI AEROSPHERE) 160-9-4.8 MCG/ACT AERO Take 2 puffs first thing in am and then another 2 puffs about 12 hours later. 08/23/21  Yes  Tanda Rockers, MD  Cholecalciferol (VITAMIN D3) 50 MCG (2000 UT) capsule Take 2,000 Units by mouth daily after lunch. 04/05/21  Yes [provider]  FLUoxetine (PROZAC) 20 MG capsule Take 1 capsule (20 mg total) by mouth daily. Patient taking differently: Take 60 mg by mouth daily. 07/17/21  Yes Parks Ranger, DO  gabapentin (NEURONTIN) 300 MG capsule Take 1 capsule (300 mg total) by mouth at bedtime. 07/16/21  Yes Parks Ranger, DO  HYDROcodone-acetaminophen [facility]) 10-325 MG tablet Take 1 tablet by mouth in the morning, at noon, in the evening, and at bedtime.   Yes [provider]  loperamide (IMODIUM) 2 MG capsule Take 2 mg by mouth as needed for diarrhea or loose stools.   Yes [provider]  magnesium hydroxide (MILK OF MAGNESIA) 400 MG/5ML suspension Take 30 mLs by mouth daily as needed for mild constipation.   Yes [provider]  Multiple Vitamin (MULTIVITAMIN) tablet Take 1 tablet by mouth daily.   Yes [provider]  OXYGEN Inhale 2 L into the lungs continuous.   Yes [provider]  senna (SENOKOT) 8.6 MG TABS tablet Take 1 tablet by mouth daily.   Yes [provider]  spironolactone (ALDACTONE) 25 MG tablet Take 25 mg by mouth daily. 10/07/21  Yes [provider]  mirtazapine (REMERON) 15 MG tablet Take 1 tablet (15 mg total) by mouth at bedtime. Patient not taking: Reported on 11/23/2021 07/16/21   Parks Ranger, DO  omeprazole (PRILOSEC) 20 MG capsule Take 20 mg by mouth daily. Patient not taking: Reported on 11/23/2021 10/07/21   [provider]  risperiDONE (RISPERDAL) 0.25 MG tablet Take 1 tablet (0.25 mg total) by mouth 2 (two) times daily at 8 am and 4 pm. Patient not taking: Reported on 11/23/2021 07/16/21   Parks Ranger, DO    Physical Exam: Vitals:   11/24/21 0840 11/24/21 0845 11/24/21 0850 11/24/21 0900  BP: (!) 79/41 (!) 87/58 (!) 96/59 (!) 77/55  Pulse:  (!) 103   (!) 54  Resp: '14 15 11 '$ (!) 9  Temp:      TempSrc:      SpO2: 98% 94% 96% 93%  Weight:       GENERAL:  A&O x 3, NAD, well developed, cooperative, follows commands HEENT: [place]/AT, No thrush, No icterus, No oral ulcers Neck:  No neck mass, No meningismus, soft, supple CV: RRR, no S3, no S4, no rub, no JVD Lungs:  diminished BS bilateral, no wheeze, no rhonchi, good air movement Abd: soft/NT +BS, nondistended Ext: No edema, no lymphangitis, no cyanosis, no rashes Neuro:  CN II-XII intact, strength 4/5 in RUE, RLE, strength 4/5 LUE, LLE; sensation intact bilateral; no dysmetria; babinski equivocal  Data Reviewed: Data reviewed above in history  Assessment and Plan: * Atrial fibrillation with RVR (HCC) CHADSVASc = 5 Continue diltiazem drip Cardiology consulted Now back in sinus with PACs  Depression with anxiety Currently with suicidal ideation [facility] to follow  Opioid dependence ([place]) PDMP reviewed --norco 10/325 #120 monthly  Chronic respiratory failure with hypoxia and hypercapnia (HCC) Chronically on 2L stable  Tobacco abuse Tobacco cessation discussed  COPD (chronic obstructive pulmonary disease) ([place]) Continue Breztri  Hepatocellular carcinoma ([place]) Currently under care of Dr. Burr Medico --over 5 yrs from diagnosis --sp partical hepatectomy --under observation, not on active tx      Advance Care Planning: DNR  Consults: cardiology  Family Communication: none  Severity of Illness: The appropriate patient status for this patient is OBSERVATION. Observation status is judged to be reasonable and necessary in order to provide the required intensity of service to ensure the patient's safety. The patient's presenting symptoms, physical exam findings, and initial radiographic and laboratory data in the context of their medical condition is felt to place them at decreased risk for further clinical deterioration. Furthermore, it is anticipated that  the patient will be medically stable for discharge from the hospital within 2 midnights of admission.   Author: Orson Eva, MD 11/24/2021 9:10 AM  For on call review [email or web address].

## 2021-11-24 NOTE — ED Notes (Signed)
Pt repositioned, BP improving, MAP 68, will continue to monitor, card drip down titrated to 5 from 10.

## 2021-11-24 NOTE — Assessment & Plan Note (Addendum)
-  Chronically on 2L -Stable

## 2021-11-24 NOTE — Hospital Course (Addendum)
76 year old female with a history of COPD, hepatocellular carcinoma, opioid dependence with chronic abdominal and chronic back pain, depression, tobacco abuse, hypertension, chronic respiratory failure on 2 L presenting with suicide attempt.  The patient presented to the ED on 11/22/2021 after taking 28 Excedrin on the evening of 11/21/2021.  She began having nausea and vomiting throughout the night from 11/21/2021 to 11/22/2021.  She notified her daughter on the morning of 11/22/2021 and she was brought to the emergency department for further evaluation.  [facility]  The patient was evaluated by psychiatry who recommended inpatient evaluation and treatment.  While waiting for an inpatient Ger[facility] psychiatric bed, the patient developed atrial fibrillation with RVR on the morning of 11/24/2021 with heart rate in the 170s.  She was given a diltiazem bolus, 5 mg and then started on a drip.  Admission was requested. The patient denies any fever, chills, headache, chest pain, shortness breath, vomiting, diarrhea.  She has chronic nausea.  She has not had a bowel movement.  There is no hematochezia or melena.  She denies any coughing or hemoptysis. In the ED, the patient was afebrile hemodynamically stable albeit with soft blood pressures after starting diltiazem.  Oxygen saturation is 96% on 2 L.  WBC 7.1, hemoglobin 12.3, platelets 228,000.  BMP showed sodium 137, potassium 3.9, bicarbonate 27, serum creatinine 0.64.  EKG showed atrial fibrillation with nonspecific ST-T wave change.  Urine drug screen was positive for opiates.  Cardiology was consulted to assist with management.  Fortunately, patient spontaneously converted to sinus in late morning 11/24/21 and she remained in sinus thereafter.  Cardiology changed her regimen to metoprolol 25 mg bid and midodrine was stopped.  Patient remained stable without new afib.  She is cleared medically to go to [facility] facility.

## 2021-11-24 NOTE — Assessment & Plan Note (Addendum)
CHADSVASc = 5 Continue diltiazem drip>>stopped after patient spontaneously converted to sinus in late morning 11/24/21 Cardiology consulted>>initially placed on metoprolol tid>>bid Now back in sinus with PACs 11/24/21 Echo EF 60-65%, G1DD, mild TR/MR, RVSP 40.7 TSH--0.575 Pt remained in sinus and hemodynamically stable and was cleared for d/c by cardiology Patient refuses anticoagulation after explanation of risks/benefits/alternatives -9/8 metoprolol decreased to 12.5 mg bid due to soft BP --episode may have been triggered by patient's excess caffeine intake and from Excedrin --9/12 remains in sinus, hemodynamically stable-no further afib

## 2021-11-24 NOTE — Assessment & Plan Note (Addendum)
Currently still demonstrating very depressed mood and flat affect.  Expressing no suicidal thoughts today. [facility] to follow>>recommended d/c to inpatient [facility] facility. Continue fluoxetine, mirtazapine 9/11--had a little agitation today>>gave one time dose ativan 9/12--no further agitation.  Patient is hemodynamically stable to discharge to psychiatric facility when bed available. -Continue supportive care and consult reorientation.

## 2021-11-24 NOTE — Assessment & Plan Note (Addendum)
-  Currently under care of Dr. Burr Medico at [facility] --over 5 yrs from diagnosis --sp partical hepatectomy --under observation, not on active tx -Continue outpatient follow-up.

## 2021-11-24 NOTE — ED Notes (Addendum)
EDP at [facility], with bedside cardiac [place]

## 2021-11-24 NOTE — Progress Notes (Signed)
*  PRELIMINARY RESULTS* Echocardiogram 2D Echocardiogram has been performed.  Elpidio Anis 11/24/2021, 3:56 PM

## 2021-11-24 NOTE — Assessment & Plan Note (Addendum)
-  Continue Breztri -no wheezing -good saturation on chronic supplementation.

## 2021-11-24 NOTE — Consult Note (Addendum)
Cardiology Consultation   Patient ID: Tina Bailey MRN: [id number]; DOB: Jul 14, 1945  Admit date: 11/22/2021 Date of Consult: 11/24/2021  PCP:  Vilinda Boehringer, NP   [facility] Cardiologist:  Buford Dresser, MD        Patient Profile:   Tina Bailey is a 76 y.o. female with a hx of hepatocellular carcarinoma s/p prior surgery, chronic pain with arthritis, prior tobacco abuse, severe COPD with chronic respiratory failure (followed by pulm), chronic diastolic CHF with severe pulm HTN, fibromyalgia, macular degeneration, 4.2cm ascending TAA by CT 2019 (pt declined further eval), R atrial mass by echo 2019 (pt declined further eval) who is being seen 11/24/2021 for the evaluation of afib RVR at the request of Dr. Doren Custard.  History of Present Illness:   Tina Bailey follows with Dr. Harrell Gave for h/o chronic diastolic CHF. Prior nuc in 2016 was normal. She had an echo 2019 showing EF 55-60%, G1DD, mildly reduced RVSF, right atrial mass, severe pulm HTN, and small pericardial effusion. She refused further workup of this and did not want to consider CT surgery. She also has a TAA last assessed in 2019 at 4.2cm for which she has also declined additional eval.  She presented to [facility] via EMS with suicide attempt from her living facility. EMS reported a suicide note dated 09/18/21 and she had taken Excedrin migraine 24 pills the night prior. EMS reported seeing vomitus in trash can on scene. She was subsequently admitted for IVC and planned behavioral health admission. Labs showed acetaminophen level wnl, salicyliate level of 21 (still wnl), no caffeine level. Covid, TSH, CBC , K, Cr OK. Initial EKG showed NSR with QT prolongation at 511 but improved on follow-up tracing. Blood pressure has been soft in the 90s. She as treated with IV fluids. However, this morning she was noted to go into rapid atrial fib with RVR with HR 150s-160s with soft BP. She was started on IV  diltiazem with initial '5mg'$  bolus then drip, then another '5mg'$  bolus with HR presently low 100s. She is a difficult historian and answers questions hastily saying she doesn't want to be here, she is in a lot of pain, she hates [place], and wants to know when she will be moved. She denies any CP, SOB or palpitations. Last BP 96/59. Last antihypertensives received were 2.'5mg'$  amlodipine and '25mg'$  of spironolactone on 9/5.   Past Medical History:  Diagnosis Date   Arthritis    DDD. Right shoulder"is frozen"-limited ROM. osteoporosis.   Blue toe syndrome ([place]) 11/27/2014   Dr. Claudia Pollock evaluating   CHF (congestive heart failure) (HCC)    Complication of anesthesia    COPD (chronic obstructive pulmonary disease) ([place])    Fibromyalgia    Fracture of rib of right side    hx "osteoporosis"- states her dog nudge her on the side, next day developed great pain and was told has a fracture rib.   Liver cancer ([place]) 12/2014   Macular degeneration    R eye   Osteoporosis    PONV (postoperative nausea and vomiting)    nausea,severe vomiting after 01-13-15 portal vein embolization   Productive cough    Retina disorder    L eye, vision distorted, edema   Spine fracture due to birth trauma    TMJ disease    Wears glasses     Past Surgical History:  Procedure Laterality Date   ANGIOPLASTY  2008   no stents required, no follow-up with cardiologist, no recurrent  chest pain   CATARACT EXTRACTION, BILATERAL Bilateral    ESOPHAGOGASTRODUODENOSCOPY (EGD) WITH PROPOFOL N/A 06/25/2015   Procedure: ESOPHAGOGASTRODUODENOSCOPY (EGD) WITH PROPOFOL;  Surgeon: Milus Banister, MD;  Location: [facility];  Service: Endoscopy;  Laterality: N/A;  stent removal    EYE SURGERY     cornea surgery   INTRAMEDULLARY (IM) NAIL INTERTROCHANTERIC Right 12/04/2017   Procedure: INTRAMEDULLARY (IM) NAIL INTERTROCHANTRIC;  Surgeon: Rod Can, MD;  Location: [facility];  Service: Orthopedics;  Laterality: Right;   IR  RADIOLOGIST EVAL & MGMT  03/29/2017   KIDNEY STONE SURGERY     LAPAROSCOPIC PARTIAL HEPATECTOMY N/A 03/04/2015   Procedure: DIAGNOSTIC LAPAROSCOPY, EXTENDED RIGHT HEPATECTOMY, WITH INTRAOPERATIVE ULTRASOUND;  Surgeon: Stark Klein, MD;  Location: [facility];  Service: General;  Laterality: N/A;   PARTIAL HYSTERECTOMY     portal vein embolization     01-13-15 -Dr. Cathlean Sauer.     Home Medications:  Prior to Admission medications   Medication Sig Start Date End Date Taking? Authorizing Provider  amLODipine (NORVASC) 5 MG tablet Take 1 tablet (5 mg total) by mouth daily. 07/17/21  Yes Parks Ranger, DO  Budeson-Glycopyrrol-Formoterol (BREZTRI AEROSPHERE) 160-9-4.8 MCG/ACT AERO Take 2 puffs first thing in am and then another 2 puffs about 12 hours later. 08/23/21  Yes Tanda Rockers, MD  Cholecalciferol (VITAMIN D3) 50 MCG (2000 UT) capsule Take 2,000 Units by mouth daily after lunch. 04/05/21  Yes [provider]  FLUoxetine (PROZAC) 20 MG capsule Take 1 capsule (20 mg total) by mouth daily. Patient taking differently: Take 60 mg by mouth daily. 07/17/21  Yes Parks Ranger, DO  gabapentin (NEURONTIN) 300 MG capsule Take 1 capsule (300 mg total) by mouth at bedtime. 07/16/21  Yes Parks Ranger, DO  HYDROcodone-acetaminophen [facility]) 10-325 MG tablet Take 1 tablet by mouth in the morning, at noon, in the evening, and at bedtime.   Yes [provider]  loperamide (IMODIUM) 2 MG capsule Take 2 mg by mouth as needed for diarrhea or loose stools.   Yes [provider]  magnesium hydroxide (MILK OF MAGNESIA) 400 MG/5ML suspension Take 30 mLs by mouth daily as needed for mild constipation.   Yes [provider]  Multiple Vitamin (MULTIVITAMIN) tablet Take 1 tablet by mouth daily.   Yes [provider]  OXYGEN Inhale 2 L into the lungs continuous.   Yes [provider]  senna (SENOKOT) 8.6 MG TABS tablet Take 1 tablet by mouth  daily.   Yes [provider]  spironolactone (ALDACTONE) 25 MG tablet Take 25 mg by mouth daily. 10/07/21  Yes [provider]  mirtazapine (REMERON) 15 MG tablet Take 1 tablet (15 mg total) by mouth at bedtime. Patient not taking: Reported on 11/23/2021 07/16/21   Parks Ranger, DO  omeprazole (PRILOSEC) 20 MG capsule Take 20 mg by mouth daily. Patient not taking: Reported on 11/23/2021 10/07/21   [provider]  risperiDONE (RISPERDAL) 0.25 MG tablet Take 1 tablet (0.25 mg total) by mouth 2 (two) times daily at 8 am and 4 pm. Patient not taking: Reported on 11/23/2021 07/16/21   Parks Ranger, DO    Inpatient Medications: Scheduled Meds:  cholecalciferol  2,000 Units Oral QPC lunch   diltiazem  5 mg Intravenous Once   FLUoxetine  20 mg Oral Daily   gabapentin  300 mg Oral QHS   mirtazapine  15 mg Oral QHS   mometasone-formoterol  2 puff Inhalation BID  pantoprazole  40 mg Oral Daily   risperiDONE  0.25 mg Oral [id number]   umeclidinium bromide  1 puff Inhalation Daily   Continuous Infusions:  diltiazem (CARDIZEM) infusion 5 mg/hr (11/24/21 0842)   PRN Meds: oxyCODONE  Allergies:    Allergies  Allergen Reactions   Zoloft [Sertraline Hcl] Other (See Comments)    Zoloft, had a terrible reaction   Amoxicillin-Pot Clavulanate Nausea Only    Amoxacillin Augmentin combo  Has patient had a PCN reaction causing immediate rash, facial/tongue/throat swelling, SOB or lightheadedness with hypotension: No Has patient had a PCN reaction causing severe rash involving mucus membranes or skin necrosis: No Has patient had a PCN reaction that required hospitalization No Has patient had a PCN reaction occurring within the last 10 years: No If all of the above answers are "NO", then may proceed with Cephalosporin use.    Clindamycin/Lincomycin Other (See Comments)    Felt like it "burned out" her stomach/took a large dose   Paxil [Paroxetine Hcl] Rash    Sulfa Antibiotics Rash    Social History:   Social History   Socioeconomic History   Marital status: Divorced    Spouse name: Not on file   Number of children: 3   Years of education: Not on file   Highest education level: Not on file  Occupational History   Occupation: retired  Tobacco Use   Smoking status: Former    Packs/day: 1.00    Years: 45.00    Total pack years: 45.00    Types: Cigarettes    Quit date: 02/20/2018    Years since quitting: 3.7   Smokeless tobacco: Never  Vaping Use   Vaping Use: Never used  Substance and Sexual Activity   Alcohol use: No    Alcohol/week: 0.0 standard drinks of alcohol   Drug use: No   Sexual activity: Not Currently  Other Topics Concern   Not on file  Social History Narrative   Not on file   Social Determinants of Health   Financial Resource Strain: Not on file  Food Insecurity: Not on file  Transportation Needs: Not on file  Physical Activity: Not on file  Stress: Not on file  Social Connections: Not on file  Intimate Partner Violence: Not on file    Family History:    Family History  Problem Relation Age of Onset   Hypertension Mother    Atrial fibrillation Mother    Heart disease Mother        after age 87   Breast cancer Mother    Stroke Father    Dementia Father    Peptic Ulcer Father    Heart disease Brother        After age 80- A-Fib   Heart attack Brother      ROS:  Please see the history of present illness. Challenging historian though all other ROS reviewed and negative.     Physical Exam/Data:   Vitals:   11/24/21 0830 11/24/21 0840 11/24/21 0845 11/24/21 0850  BP: (!) 88/56 (!) 79/41 (!) 87/58 (!) 96/59  Pulse: (!) 117 (!) 103    Resp: '16 14 15 11  '$ Temp:      TempSrc:      SpO2: 96% 98% 94% 96%  Weight:       No intake or output data in the 24 hours ending 11/24/21 0856    11/22/2021    3:10 PM 08/23/2021    9:41 AM 07/05/2021    1:00 PM  Last 3 Weights  Weight (lbs) 130 lb 15.3 oz 131  lb   Weight (kg) 59.4 kg 59.421 kg      Information is confidential and restricted. Go to Review Flowsheets to unlock data.     Body mass index is 25.58 kg/m.  General: Well developed, well nourished, in no acute distress. Head: Normocephalic, atraumatic, sclera non-icteric, no xanthomas, nares are without discharge. Neck: Negative for carotid bruits. JVP not elevated. Lungs: Clear bilaterally to auscultation without wheezes, rales, or rhonchi. Breathing is unlabored. Heart: Irregularly irregular, rate mildly elevated, S1 S2 without murmurs, rubs, or gallops.  Abdomen: Soft, non-tender, non-distended with normoactive bowel sounds. No rebound/guarding. Extremities: No clubbing or cyanosis. No edema. Distal pedal pulses are 2+ and equal bilaterally. Neuro: Alert and oriented X 3. Moves all extremities spontaneously. Psych:  Responds to questions appropriately with a tangential affect.   EKG:  The EKG was personally reviewed and demonstrates:   Initial tracing NSR with QTc 553m otherwise no acute changes F/u tracing NSR occasional PAC with QTC 4654motherwise no acute changes F/u tracing this AM AF RVR nonspecific STTW changes QTc estimated at 4839m Telemetry:  Telemetry was personally reviewed and demonstrates:  AF RVR low 100s to 120s presently, previously 150s-160s - before that was NSR  Relevant CV Studies:  NST 2016 Nuclear stress EF: 67%. There was no ST segment deviation noted during stress. The study is normal. This is a low risk study. The left ventricular ejection fraction is hyperdynamic (>65%).  Echo 2019  - Left ventricle: The cavity size was normal. Systolic function was    normal. The estimated ejection fraction was in the range of 55%    to 60%. Wall motion was normal; there were no regional wall    motion abnormalities. Doppler parameters are consistent with    abnormal left ventricular relaxation (grade 1 diastolic    dysfunction).  - Right ventricle: The  cavity size was mildly dilated. Wall    thickness was normal. Systolic function was mildly reduced.  - Right atrium: There was a mass in the atrial cavity adjacent to    the intra-atrial septum.  - Tricuspid valve: There was moderate regurgitation.  - Pulmonary arteries: Systolic pressure was severely increased. PA    peak pressure: 60 mm Hg (S).  - Pericardium, extracardiac: A small pericardial effusion was    identified. There is mitral inflow respiratory variability, but    no clear RA/RV collapse.   Impressions:   - Normal LV EF, grade 1 diastolic dysfunction.    There is a structure in the right atrial cavity adjacent to the    intra-atrial septum that cannot be well defined. Could be very    hypertrophied septum, though favor other etiology, cannot exclude    thrombus, myxoma, or other.    Severe pulmonary hypertension, with mild RV dysfunction and    dilation.    Small pericardial effusion, predominantly posterior. There is    mitral inflow variability, though this may be unrelated to    effusion. No RA/RV collapse seen.   Laboratory Data:  High Sensitivity Troponin:  No results for input(s): "TROPONINIHS" in the last 720 hours.   Chemistry Recent Labs  Lab 11/22/21 1600 11/24/21 0721  NA 135 137  K 3.6 3.9  CL 98 101  CO2 27 27  GLUCOSE 95 105*  BUN 14 18  CREATININE 0.59 0.64  CALCIUM 8.8* 8.8*  MG 2.1 2.1  GFRNONAA >60 >60  ANIONGAP  10 9    Recent Labs  Lab 11/22/21 1600 11/22/21 1923  PROT 6.9 7.1  ALBUMIN 3.8 3.9  AST 18 19  ALT 11 13  ALKPHOS 67 68  BILITOT 0.9 0.8   Lipids No results for input(s): "CHOL", "TRIG", "HDL", "LABVLDL", "LDLCALC", "CHOLHDL" in the last 168 hours.  Hematology Recent Labs  Lab 11/22/21 1600 11/24/21 0721  WBC 8.9 7.1  RBC 3.66* 4.07  HGB 11.1* 12.3  HCT 34.5* 39.6  MCV 94.3 97.3  MCH 30.3 30.2  MCHC 32.2 31.1  RDW 13.7 14.0  PLT 230 228   Thyroid  Recent Labs  Lab 11/24/21 0722  TSH 0.575    BNPNo  results for input(s): "BNP", "PROBNP" in the last 168 hours.  DDimer No results for input(s): "DDIMER" in the last 168 hours.   Radiology/Studies:  No results found.   Assessment and Plan:   1. Suicide attempt - per notes, 24 Excedrine Migraine tablets taken 9/3 but vomitus in trash can - acetaminophen, salicyliate level were still wnl on arrival, no caffeine level obtained - further per ED team, psych  2. New onset atrial fib with RVR - improved with IV diltiazem though BP remains on softer side - per literature review, if caffeine toxicity is of concern, can consider beta blockade but she is now ~2.5-3 days out from taking excess Excedrine - hold home amlodipine and spironolactone - CHADSVASC is 5 for CHF, aortic atherosclerosis, female, age therefore would consider anticoagulation hough not certain patient would  be agreeable to this - very difficult to converse with - consider repeat echo when HR more consistently stable and patient cooperative  3. Chronic diastolic CHF - volume status looks OK, did not require loop diuretic PTA - hold amlodipine, spironolactone due to hypotension  4. Prolonged QT on arrival - potentially med related, improved on follow-up tracings - ED Team has been managing electrolytes - recommend keeping K 4.0 or greater and Mg 2.0 or greater  5. Right atrial mass on echo 2019 - pt declined further workup at the time  6. 4.2cm ascending TAA in 2019 - pt declined further workup previously for this   Risk Assessment/Risk Scores:        New York Heart Association (NYHA) Functional Class Cannot reliably assess due to pt cooperation with questioning   CHA2DS2-VASc Score = 5   This indicates a 7.2% annual risk of stroke. The patient's score is based upon: CHF History: 1 HTN History: 0 Diabetes History: 0 Stroke History: 0 Vascular Disease History: 1 Age Score: 2 Gender Score: 1         For questions or updates, please contact [place] Please consult [email or web address] for contact info under    Signed, Charlie Pitter, PA-C  11/24/2021 8:56 AM  Attending note Patient seen and discusssed with PA Dunn, I agree with her documentation. 76 yo female history of severe COPD, chronic diastolic HF, pulm HTN, hepatocellualr carcinoma s/p surgery presents initially after suicide attempt at home by taking 28 pills of excedrin migraine. Managed in ER along with poison control, was medically cleared for psych admission however in the interim developed afib with RVR.    K 3.6 Cr 0.59 BUN 14 WBC 8.9 Hgb 11.1 Plt 230 Mg 2.1 TSH 0.575  COVID neg  02/2018 echo: LVEF 55-60%, grade I dd, mild RV dysfunction, RA mass(she deferred follow workup).    New diagnosis afib in setting of suicide attempt taking 28 pills excedrin migraine(tylenolol '250mg'$ ,  aspirin '250mg'$ , caffeine 65 mg). If accurate pill number report load of '7000mg'$  of aspirin and '1820mg'$  of caffeine (equivalent to about 20 cups of coffee) though did have some vomiting and salicylate and acetaminophen levels have not been significantly elevated. Unclear if afib related to high stimulant ingestion. May consider outpatient monitor.  Ongoing high rates, bp's soft on dilt gtt. Would look to avoid amio if possible given history of liver disease with [place] and hepatectomy, though imaging currently with just mild changes of cirrhosis. Will give IV loading dose of digoxin for now, continue IV dilt gtt as bp's tolerate, hold home bp meds. At this time she declines starting anticoagulation, will continue to discuss during admission. Absence of anticoag would prohibit cardioversion considerations.    Carlyle Dolly MD

## 2021-11-24 NOTE — ED Notes (Signed)
Up to floor on monitor, with sitter, remains NSR HR 78

## 2021-11-24 NOTE — Progress Notes (Signed)
Pt was accepted to [facility] 11/24/21; [facility]  Pt meets inpatient criteria per Garrison Columbus, NP  Attending Physician will be Dr. Elaina Hoops   Report can be called to: [phone] or [phone]  Pt can arrive after 8:00am  Care Team notified: Verlene Mayer, RN   Nadara Mode, LCSWA 11/24/2021 @ 1:45 AM

## 2021-11-24 NOTE — ED Notes (Signed)
[facility] notified, pt no longer coming to [facility], medically admitted

## 2021-11-25 DIAGNOSIS — R7302 Impaired glucose tolerance (oral): Secondary | ICD-10-CM

## 2021-11-25 DIAGNOSIS — J9611 Chronic respiratory failure with hypoxia: Secondary | ICD-10-CM | POA: Diagnosis not present

## 2021-11-25 DIAGNOSIS — I10 Essential (primary) hypertension: Secondary | ICD-10-CM

## 2021-11-25 DIAGNOSIS — C220 Liver cell carcinoma: Secondary | ICD-10-CM | POA: Diagnosis not present

## 2021-11-25 DIAGNOSIS — I4891 Unspecified atrial fibrillation: Secondary | ICD-10-CM | POA: Diagnosis not present

## 2021-11-25 DIAGNOSIS — F418 Other specified anxiety disorders: Secondary | ICD-10-CM | POA: Diagnosis not present

## 2021-11-25 LAB — BASIC METABOLIC PANEL
Anion gap: 5 (ref 5–15)
BUN: 21 mg/dL (ref 8–23)
CO2: 30 mmol/L (ref 22–32)
Calcium: 8.7 mg/dL — ABNORMAL LOW (ref 8.9–10.3)
Chloride: 102 mmol/L (ref 98–111)
Creatinine, Ser: 0.68 mg/dL (ref 0.44–1.00)
GFR, Estimated: >60 mL/min (ref >60)
Glucose, Bld: 89 mg/dL (ref 70–99)
Potassium: 4.2 mmol/L (ref 3.5–5.1)
Sodium: 137 mmol/L (ref 135–145)

## 2021-11-25 LAB — MAGNESIUM: Magnesium: 2 mg/dL (ref 1.7–2.4)

## 2021-11-25 MED ORDER — METOPROLOL TARTRATE 25 MG PO TABS
25.0000 mg | ORAL_TABLET | Freq: Two times a day (BID) | ORAL | Status: DC
  Administered 2021-11-25: 25 mg via ORAL
  Filled 2021-11-25 (×2): qty 1

## 2021-11-25 MED ORDER — METOPROLOL TARTRATE 25 MG PO TABS: 25.0000 mg | ORAL_TABLET | Freq: Two times a day (BID) | ORAL | Status: DC

## 2021-11-25 NOTE — Progress Notes (Signed)
Cardiology f/u arranged with NP Walker @ [facility] (Where pt was last seen), appt info placed on AVS.

## 2021-11-25 NOTE — Progress Notes (Signed)
TTS notified that patient is medically stable for [facility] placement.    Evonda Enge, Clydene Pugh, LCSW

## 2021-11-25 NOTE — Discharge Summary (Addendum)
Physician Discharge Summary   Patient: Tina Bailey MRN: [id number] DOB: 1945-03-29  Admit date:     11/22/2021  Discharge date: 11/25/21  Discharge Physician: [name] Steffie Waggoner   PCP: Vilinda Boehringer, NP   Recommendations at discharge:   Please follow up with primary care provider within 1-2 weeks  Please repeat BMP and CBC in one week     Hospital Course: 76 year old female with a history of COPD, hepatocellular carcinoma, opioid dependence with chronic abdominal and chronic back pain, depression, tobacco abuse, hypertension, chronic respiratory failure on 2 L presenting with suicide attempt.  The patient presented to the ED on 11/22/2021 after taking 28 Excedrin on the evening of 11/21/2021.  She began having nausea and vomiting throughout the night from 11/21/2021 to 11/22/2021.  She notified her daughter on the morning of 11/22/2021 and she was brought to the emergency department for further evaluation.  [facility]  The patient was evaluated by psychiatry who recommended inpatient evaluation and treatment.  While waiting for an inpatient [facility] psychiatric bed, the patient developed atrial fibrillation with RVR on the morning of 11/24/2021 with heart rate in the 170s.  She was given a diltiazem bolus, 5 mg and then started on a drip.  Admission was requested. The patient denies any fever, chills, headache, chest pain, shortness breath, vomiting, diarrhea.  She has chronic nausea.  She has not had a bowel movement.  There is no hematochezia or melena.  She denies any coughing or hemoptysis. In the ED, the patient was afebrile hemodynamically stable albeit with soft blood pressures after starting diltiazem.  Oxygen saturation is 96% on 2 L.  WBC 7.1, hemoglobin 12.3, platelets 228,000.  BMP showed sodium 137, potassium 3.9, bicarbonate 27, serum creatinine 0.64.  EKG showed atrial fibrillation with nonspecific ST-T wave change.  Urine drug screen was positive for opiates.  Cardiology  was consulted to assist with management.  Fortunately, patient spontaneously converted to sinus in late morning 11/24/21 and she remained in sinus thereafter.  Cardiology changed her regimen to metoprolol 25 mg bid and midodrine was stopped.  Patient remained stable without new afib.  She is cleared medically to go to [facility] facility.  Assessment and Plan: * Atrial fibrillation with RVR (HCC) CHADSVASc = 5 Continue diltiazem drip>>stopped after patient spontaneously converted to sinus in late morning 11/24/21 Cardiology consulted>>initially placed on metoprolol tid>>bid Now back in sinus with PACs 11/24/21 Echo EF 60-65%, G1DD, mild TR/MR, RVSP 40.7 TSH--0.575 Pt remained in sinus and hemodynamically stable and was cleared for d/c by cardiology Patient refuses anticoagulation after explanation of risks/benefits/alternatives --episode may have been triggered by patient's excess caffeine intake and from Excedrin  Depression with anxiety Currently with suicidal ideation [place] telepsychiatry to follow>>recommended d/c to inpatient [facility] facility Continue fluoxetine, mirtazapine Medically stable to go to [place]  Impaired glucose tolerance 05/24/21 A1C--6.2 CBG controlled during hospitalization Outpatient PCP follow up  Essential hypertension Stopped amlodipine and spironolactone to allow BP margin to titrate metoprolol --BP controlled  Opioid dependence ([place]) PDMP reviewed --norco 10/325 #120 monthly  Chronic respiratory failure with hypoxia and hypercapnia ([place]) Chronically on 2L stable  Tobacco abuse Tobacco cessation discussed  COPD (chronic obstructive pulmonary disease) ([place]) Continue Breztri  Hepatocellular carcinoma ([place]) Currently under care of Dr. Burr Medico at [facility] --over 5 yrs from diagnosis --sp partical hepatectomy --under observation, not on active tx         Consultants: cardiology Procedures performed: none  Disposition:  Behavioral  Health  Facility Diet recommendation:  Carb modified diet DISCHARGE MEDICATION: Allergies as of 11/25/2021       Reactions   Zoloft [sertraline Hcl] Other (See Comments)   Zoloft, had a terrible reaction   Amoxicillin-pot Clavulanate Nausea Only   Amoxacillin Augmentin combo  Has patient had a PCN reaction causing immediate rash, facial/tongue/throat swelling, SOB or lightheadedness with hypotension: No Has patient had a PCN reaction causing severe rash involving mucus membranes or skin necrosis: No Has patient had a PCN reaction that required hospitalization No Has patient had a PCN reaction occurring within the last 10 years: No If all of the above answers are "NO", then may proceed with Cephalosporin use.   Clindamycin/lincomycin Other (See Comments)   Felt like it "burned out" her stomach/took a large dose   Paxil [paroxetine Hcl] Rash   Sulfa Antibiotics Rash        Medication List     STOP taking these medications    amLODipine 5 MG tablet Commonly known as: NORVASC   loperamide 2 MG capsule Commonly known as: IMODIUM   magnesium hydroxide 400 MG/5ML suspension Commonly known as: MILK OF MAGNESIA   spironolactone 25 MG tablet Commonly known as: ALDACTONE       TAKE these medications    Breztri Aerosphere 160-9-4.8 MCG/ACT Aero Generic drug: Budeson-Glycopyrrol-Formoterol Take 2 puffs first thing in am and then another 2 puffs about 12 hours later.   FLUoxetine 20 MG capsule Commonly known as: PROZAC Take 1 capsule (20 mg total) by mouth daily. What changed: how much to take   gabapentin 300 MG capsule Commonly known as: NEURONTIN Take 1 capsule (300 mg total) by mouth at bedtime.   HYDROcodone-acetaminophen 10-325 MG tablet Commonly known as: NORCO Take 1 tablet by mouth in the morning, at noon, in the evening, and at bedtime.   metoprolol tartrate 25 MG tablet Commonly known as: LOPRESSOR Take 1 tablet (25 mg total) by mouth 2 (two) times daily.    mirtazapine 15 MG tablet Commonly known as: REMERON Take 1 tablet (15 mg total) by mouth at bedtime.   multivitamin tablet Take 1 tablet by mouth daily.   omeprazole 20 MG capsule Commonly known as: PRILOSEC Take 20 mg by mouth daily.   OXYGEN Inhale 2 L into the lungs continuous.   risperiDONE 0.25 MG tablet Commonly known as: RISPERDAL Take 1 tablet (0.25 mg total) by mouth 2 (two) times daily at 8 am and 4 pm.   senna 8.6 MG Tabs tablet Commonly known as: SENOKOT Take 1 tablet by mouth daily.   Vitamin D3 50 MCG (2000 UT) capsule Take 2,000 Units by mouth daily after lunch.        Follow-up Information     Loel Dubonnet, NP Follow up.   Specialty: Cardiology Why: [facility] - a cardiology follow-up has been arranged for you on Monday Dec 13, 2021 at 1:55 PM (Arrive by 1:40 PM). Contact information: [place] [address] [phone]                Discharge Exam: Filed Weights   11/22/21 1510 11/24/21 1208  Weight: 59.4 kg 55.1 kg   HEENT:  /AT, No thrush, no icterus CV:  RRR, no rub, no S3, no S4 Lung:  diminished BS, bibasilar rales. No wheeze Abd:  soft/+BS, NT Ext:  No edema, no lymphangitis, no synovitis, no rash   Condition at discharge: stable  The results of  significant diagnostics from this hospitalization (including imaging, microbiology, ancillary and laboratory) are listed below for reference.   Imaging Studies: ECHOCARDIOGRAM COMPLETE  Result Date: 11/24/2021    ECHOCARDIOGRAM REPORT   Patient Name:   DAMARY DOLAND Date of Exam: 11/24/2021 Medical Rec #:  [id number]         Height:       60.0 in Accession #:    [id number]        Weight:       121.5 lb Date of Birth:  [birth year]        BSA:          1.510 m Patient Age:    73 years          BP:           109/42 mmHg Patient Gender: F                 HR:           67 bpm. Exam Location:  [facility] Procedure: 2D Echo,  Cardiac Doppler and Color Doppler Indications:    Atrial Fibrillation  History:        Patient has prior history of Echocardiogram examinations, most                 recent 03/02/2018. CHF, Pulmonary HTN and COPD,                 Arrythmias:Atrial Fibrillation; Risk Factors:Former Smoker.  Sonographer:    Wenda Low Referring Phys: [phone] Kaiyla Stahly IMPRESSIONS  1. Left ventricular ejection fraction, by estimation, is 60 to 65%. The left ventricle has normal function. The left ventricle has no regional wall motion abnormalities. There is mild left ventricular hypertrophy. Left ventricular diastolic parameters are consistent with Grade I diastolic dysfunction (impaired relaxation).  2. Right ventricular systolic function is normal. The right ventricular size is normal. There is mildly elevated pulmonary artery systolic pressure.  3. Linear density in right atrium poorly visualized, unclear etiology.  4. The mitral valve is abnormal. Mild mitral valve regurgitation. No evidence of mitral stenosis.  5. The tricuspid valve is abnormal.  6. The aortic valve is tricuspid. There is mild calcification of the aortic valve. There is mild thickening of the aortic valve. Aortic valve regurgitation is not visualized. No aortic stenosis is present. FINDINGS  Left Ventricle: Left ventricular ejection fraction, by estimation, is 60 to 65%. The left ventricle has normal function. The left ventricle has no regional wall motion abnormalities. The left ventricular internal cavity size was normal in size. There is  mild left ventricular hypertrophy. Left ventricular diastolic parameters are consistent with Grade I diastolic dysfunction (impaired relaxation). Normal left ventricular filling pressure. Right Ventricle: The right ventricular size is normal. Right vetricular wall thickness was not well visualized. Right ventricular systolic function is normal. There is mildly elevated pulmonary artery systolic pressure. The tricuspid  regurgitant velocity  is 3.07 m/s, and with an assumed right atrial pressure of 3 mmHg, the estimated right ventricular systolic pressure is 62.1 mmHg. Left Atrium: Left atrial size was normal in size. Right Atrium: Linear density in right atrium poorly visualized, unclear etiology. Right atrial size was normal in size. Pericardium: There is no evidence of pericardial effusion. Mitral Valve: The mitral valve is abnormal. Mild mitral valve regurgitation. No evidence of mitral valve stenosis. MV peak gradient, 4.2 mmHg. The mean mitral valve gradient is 2.0 mmHg. Tricuspid Valve: The tricuspid valve is abnormal. Tricuspid valve regurgitation  is mild . No evidence of tricuspid stenosis. Aortic Valve: The aortic valve is tricuspid. There is mild calcification of the aortic valve. There is mild thickening of the aortic valve. There is mild aortic valve annular calcification. Aortic valve regurgitation is not visualized. No aortic stenosis  is present. Aortic valve mean gradient measures 3.5 mmHg. Aortic valve peak gradient measures 8.1 mmHg. Aortic valve area, by VTI measures 2.57 cm. Pulmonic Valve: The pulmonic valve was not well visualized. Pulmonic valve regurgitation is not visualized. No evidence of pulmonic stenosis. Aorta: The aortic root is normal in size and structure. Venous: Small IVC suggesting low RA pressure and hypovolemia. IAS/Shunts: No atrial level shunt detected by color flow Doppler.  LEFT VENTRICLE PLAX 2D LVIDd:         4.30 cm   Diastology LVIDs:         2.90 cm   LV e' medial:    6.85 cm/s LV PW:         0.90 cm   LV E/e' medial:  13.2 LV IVS:        1.20 cm   LV e' lateral:   8.92 cm/s LVOT diam:     2.00 cm   LV E/e' lateral: 10.1 LV SV:         76 LV SV Index:   50 LVOT Area:     3.14 cm  RIGHT VENTRICLE RV Basal diam:  3.35 cm RV Mid diam:    2.10 cm RV S prime:     11.10 cm/s TAPSE (M-mode): 1.8 cm LEFT ATRIUM             Index        RIGHT ATRIUM           Index LA diam:        3.40 cm  2.25 cm/m   RA Area:     13.10 cm LA Vol (A2C):   50.2 ml 33.24 ml/m  RA Volume:   28.50 ml  18.87 ml/m LA Vol (A4C):   35.7 ml 23.64 ml/m LA Biplane Vol: 44.6 ml 29.53 ml/m  AORTIC VALVE                    PULMONIC VALVE AV Area (Vmax):    2.30 cm     PV Vmax:       0.83 m/s AV Area (Vmean):   2.29 cm     PV Peak grad:  2.8 mmHg AV Area (VTI):     2.57 cm AV Vmax:           142.00 cm/s AV Vmean:          86.250 cm/s AV VTI:            0.295 m AV Peak Grad:      8.1 mmHg AV Mean Grad:      3.5 mmHg LVOT Vmax:         104.00 cm/s LVOT Vmean:        63.000 cm/s LVOT VTI:          0.241 m LVOT/AV VTI ratio: 0.82  AORTA Ao Root diam: 3.50 cm MITRAL VALVE               TRICUSPID VALVE MV Area (PHT): 4.29 cm    TR Peak grad:   37.7 mmHg MV Area VTI:   2.22 cm    TR Vmax:        307.00 cm/s MV Peak  grad:  4.2 mmHg MV Mean grad:  2.0 mmHg    SHUNTS MV Vmax:       1.03 m/s    Systemic VTI:  0.24 m MV Vmean:      59.2 cm/s   Systemic Diam: 2.00 cm MV Decel Time: 177 msec MV E velocity: 90.40 cm/s MV A velocity: 93.80 cm/s MV E/A ratio:  0.96 Carlyle Dolly MD Electronically signed by Carlyle Dolly MD Signature Date/Time: 11/24/2021/4:11:35 PM    Final     Microbiology: Results for orders placed or performed during the hospital encounter of 11/22/21  SARS Coronavirus 2 by RT PCR (hospital order, performed in [facility] hospital lab) *cepheid single result test* Anterior Nasal Swab     Status: None   Collection Time: 11/22/21  4:26 PM   Specimen: Anterior Nasal Swab  Result Value Ref Range Status   SARS Coronavirus 2 by RT PCR NEGATIVE NEGATIVE Final    Comment: (NOTE) SARS-CoV-2 target nucleic acids are NOT DETECTED.  The SARS-CoV-2 RNA is generally detectable in upper and lower respiratory specimens during the acute phase of infection. The lowest concentration of SARS-CoV-2 viral copies this assay can detect is 250 copies / mL. A negative result does not preclude SARS-CoV-2 infection and should  not be used as the sole basis for treatment or other patient management decisions.  A negative result may occur with improper specimen collection / handling, submission of specimen other than nasopharyngeal swab, presence of viral mutation(s) within the areas targeted by this assay, and inadequate number of viral copies (<250 copies / mL). A negative result must be combined with clinical observations, patient history, and epidemiological information.  Fact Sheet for Patients:   https://www.patel.info/  Fact Sheet for Healthcare Providers: https://hall.com/  This test is not yet approved or  cleared by the [place] FDA and has been authorized for detection and/or diagnosis of SARS-CoV-2 by FDA under an Emergency Use Authorization (EUA).  This EUA will remain in effect (meaning this test can be used) for the duration of the COVID-19 declaration under Section 564(b)(1) of the Act, 21 U.S.C. section 360bbb-3(b)(1), unless the authorization is terminated or revoked sooner.  Performed at [facility], [address], [place], [place] [address]   MRSA Next Gen by PCR, Nasal     Status: None   Collection Time: 11/24/21 11:55 AM   Specimen: Nasal Mucosa; Nasal Swab  Result Value Ref Range Status   MRSA by PCR Next Gen NOT DETECTED NOT DETECTED Final    Comment: (NOTE) The GeneXpert MRSA Assay (FDA approved for NASAL specimens only), is one component of a comprehensive MRSA colonization surveillance program. It is not intended to diagnose MRSA infection nor to guide or monitor treatment for MRSA infections. Test performance is not FDA approved in patients less than 87 years old. Performed at [facility], [address], [place], [place] [address]     Labs: CBC: Recent Labs  Lab 11/22/21 1600 11/24/21 0721  WBC 8.9 7.1  NEUTROABS 7.5  --   HGB 11.1* 12.3  HCT 34.5* 39.6  MCV 94.3 97.3  PLT 230 448   Basic Metabolic Panel: Recent  Labs  Lab 11/22/21 1600 11/24/21 0721 11/25/21 0532  NA 135 137 137  K 3.6 3.9 4.2  CL 98 101 102  CO2 '27 27 30  '$ GLUCOSE 95 105* 89  BUN '14 18 21  '$ CREATININE 0.59 0.64 0.68  CALCIUM 8.8* 8.8* 8.7*  MG 2.1 2.1 2.0   Liver  Function Tests: Recent Labs  Lab 11/22/21 1600 11/22/21 1923  AST 18 19  ALT 11 13  ALKPHOS 67 68  BILITOT 0.9 0.8  PROT 6.9 7.1  ALBUMIN 3.8 3.9   CBG: No results for input(s): "GLUCAP" in the last 168 hours.  Discharge time spent: greater than 30 minutes.  Signed: Orson Eva, MD [facility] 11/25/2021

## 2021-11-25 NOTE — Progress Notes (Addendum)
-  Pt DENIED at [facility] due to requiring O2. CSW will continue to seek placement. Pt needs [facility] In[facility] psych at this time. Pt meets criteria per [place], NP.  Benjaman Kindler, MSW, [facility] 11/25/2021 11:25 PM

## 2021-11-25 NOTE — Assessment & Plan Note (Signed)
05/24/21 A1C--6.2 CBG controlled during hospitalization Outpatient PCP follow up

## 2021-11-25 NOTE — Assessment & Plan Note (Signed)
Stopped amlodipine and spironolactone to allow BP margin to titrate metoprolol --BP controlled

## 2021-11-25 NOTE — Progress Notes (Signed)
Rounding Note    Patient Name: Tina Bailey Date of Encounter: 11/25/2021  [place] Cardiologist: Buford Dresser, MD   Subjective   No complaints  Inpatient Medications    Scheduled Meds:  Chlorhexidine Gluconate Cloth  6 each Topical Q0600   cholecalciferol  2,000 Units Oral QPC lunch   enoxaparin (LOVENOX) injection  40 mg Subcutaneous Q24H   FLUoxetine  20 mg Oral Daily   gabapentin  300 mg Oral QHS   metoprolol tartrate  25 mg Oral TID   midodrine  2.5 mg Oral TID WC   mirtazapine  15 mg Oral QHS   mometasone-formoterol  2 puff Inhalation BID   multivitamin with minerals  1 tablet Oral Daily   pantoprazole  40 mg Oral Daily   risperiDONE  0.25 mg Oral [id number]   senna  1 tablet Oral Daily   umeclidinium bromide  1 puff Inhalation Daily   Continuous Infusions:  diltiazem (CARDIZEM) infusion Stopped (11/24/21 1214)   PRN Meds: acetaminophen **OR** acetaminophen, HYDROcodone-acetaminophen, ipratropium-albuterol, ondansetron **OR** ondansetron (ZOFRAN) IV   Vital Signs    Vitals:   11/24/21 1931 11/24/21 2000 11/25/21 0500 11/25/21 0606  BP:  (!) 108/50  112/61  Pulse:  (!) 57  83  Resp:  13  20  Temp:      TempSrc:      SpO2: 99% 95% 97% 99%  Weight:      Height:        Intake/Output Summary (Last 24 hours) at 11/25/2021 0757 Last data filed at 11/24/2021 1215 Gross per 24 hour  Intake 29 ml  Output --  Net 29 ml      11/24/2021   12:08 PM 11/22/2021    3:10 PM 08/23/2021    9:41 AM  Last 3 Weights  Weight (lbs) 121 lb 7.6 oz 130 lb 15.3 oz 131 lb  Weight (kg) 55.1 kg 59.4 kg 59.421 kg      Telemetry    NSR - Personally Reviewed  ECG    N/a - Personally Reviewed  Physical Exam   GEN: No acute distress.   Neck: No JVD Cardiac: RRR, no murmurs, rubs, or gallops.  Respiratory: Clear to auscultation bilaterally. GI: Soft, nontender, non-distended  MS: No edema; No deformity. Neuro:  Nonfocal  Psych: Normal affect    Labs    High Sensitivity Troponin:  No results for input(s): "TROPONINIHS" in the last 720 hours.   Chemistry Recent Labs  Lab 11/22/21 1600 11/22/21 1923 11/24/21 0721 11/25/21 0532  NA 135  --  137 137  K 3.6  --  3.9 4.2  CL 98  --  101 102  CO2 27  --  27 30  GLUCOSE 95  --  105* 89  BUN 14  --  18 21  CREATININE 0.59  --  0.64 0.68  CALCIUM 8.8*  --  8.8* 8.7*  MG 2.1  --  2.1 2.0  PROT 6.9 7.1  --   --   ALBUMIN 3.8 3.9  --   --   AST 18 19  --   --   ALT 11 13  --   --   ALKPHOS 67 68  --   --   BILITOT 0.9 0.8  --   --   GFRNONAA >60  --  >60 >60  ANIONGAP 10  --  9 5    Lipids No results for input(s): "CHOL", "TRIG", "HDL", "LABVLDL", "LDLCALC", "CHOLHDL" in the last 168  hours.  Hematology Recent Labs  Lab 11/22/21 1600 11/24/21 0721  WBC 8.9 7.1  RBC 3.66* 4.07  HGB 11.1* 12.3  HCT 34.5* 39.6  MCV 94.3 97.3  MCH 30.3 30.2  MCHC 32.2 31.1  RDW 13.7 14.0  PLT 230 228   Thyroid  Recent Labs  Lab 11/24/21 0722  TSH 0.575    BNPNo results for input(s): "BNP", "PROBNP" in the last 168 hours.  DDimer No results for input(s): "DDIMER" in the last 168 hours.   Radiology    ECHOCARDIOGRAM COMPLETE  Result Date: 11/24/2021    ECHOCARDIOGRAM REPORT   Patient Name:   DEVAN BABINO Date of Exam: 11/24/2021 Medical Rec #:  [id number]         Height:       60.0 in Accession #:    [id number]        Weight:       121.5 lb Date of Birth:  1945/05/27        BSA:          1.510 m Patient Age:    76 years          BP:           109/42 mmHg Patient Gender: F                 HR:           67 bpm. Exam Location:  [facility] Procedure: 2D Echo, Cardiac Doppler and Color Doppler Indications:    Atrial Fibrillation  History:        Patient has prior history of Echocardiogram examinations, most                 recent 03/02/2018. CHF, Pulmonary HTN and COPD,                 Arrythmias:Atrial Fibrillation; Risk Factors:Former Smoker.  Sonographer:    Wenda Low  Referring Phys: [phone] DAVID TAT IMPRESSIONS  1. Left ventricular ejection fraction, by estimation, is 60 to 65%. The left ventricle has normal function. The left ventricle has no regional wall motion abnormalities. There is mild left ventricular hypertrophy. Left ventricular diastolic parameters are consistent with Grade I diastolic dysfunction (impaired relaxation).  2. Right ventricular systolic function is normal. The right ventricular size is normal. There is mildly elevated pulmonary artery systolic pressure.  3. Linear density in right atrium poorly visualized, unclear etiology.  4. The mitral valve is abnormal. Mild mitral valve regurgitation. No evidence of mitral stenosis.  5. The tricuspid valve is abnormal.  6. The aortic valve is tricuspid. There is mild calcification of the aortic valve. There is mild thickening of the aortic valve. Aortic valve regurgitation is not visualized. No aortic stenosis is present. FINDINGS  Left Ventricle: Left ventricular ejection fraction, by estimation, is 60 to 65%. The left ventricle has normal function. The left ventricle has no regional wall motion abnormalities. The left ventricular internal cavity size was normal in size. There is  mild left ventricular hypertrophy. Left ventricular diastolic parameters are consistent with Grade I diastolic dysfunction (impaired relaxation). Normal left ventricular filling pressure. Right Ventricle: The right ventricular size is normal. Right vetricular wall thickness was not well visualized. Right ventricular systolic function is normal. There is mildly elevated pulmonary artery systolic pressure. The tricuspid regurgitant velocity  is 3.07 m/s, and with an assumed right atrial pressure of 3 mmHg, the estimated right ventricular systolic pressure is 38.1 mmHg. Left Atrium: Left atrial  size was normal in size. Right Atrium: Linear density in right atrium poorly visualized, unclear etiology. Right atrial size was normal in size.  Pericardium: There is no evidence of pericardial effusion. Mitral Valve: The mitral valve is abnormal. Mild mitral valve regurgitation. No evidence of mitral valve stenosis. MV peak gradient, 4.2 mmHg. The mean mitral valve gradient is 2.0 mmHg. Tricuspid Valve: The tricuspid valve is abnormal. Tricuspid valve regurgitation is mild . No evidence of tricuspid stenosis. Aortic Valve: The aortic valve is tricuspid. There is mild calcification of the aortic valve. There is mild thickening of the aortic valve. There is mild aortic valve annular calcification. Aortic valve regurgitation is not visualized. No aortic stenosis  is present. Aortic valve mean gradient measures 3.5 mmHg. Aortic valve peak gradient measures 8.1 mmHg. Aortic valve area, by VTI measures 2.57 cm. Pulmonic Valve: The pulmonic valve was not well visualized. Pulmonic valve regurgitation is not visualized. No evidence of pulmonic stenosis. Aorta: The aortic root is normal in size and structure. Venous: Small IVC suggesting low RA pressure and hypovolemia. IAS/Shunts: No atrial level shunt detected by color flow Doppler.  LEFT VENTRICLE PLAX 2D LVIDd:         4.30 cm   Diastology LVIDs:         2.90 cm   LV e' medial:    6.85 cm/s LV PW:         0.90 cm   LV E/e' medial:  13.2 LV IVS:        1.20 cm   LV e' lateral:   8.92 cm/s LVOT diam:     2.00 cm   LV E/e' lateral: 10.1 LV SV:         76 LV SV Index:   50 LVOT Area:     3.14 cm  RIGHT VENTRICLE RV Basal diam:  3.35 cm RV Mid diam:    2.10 cm RV S prime:     11.10 cm/s TAPSE (M-mode): 1.8 cm LEFT ATRIUM             Index        RIGHT ATRIUM           Index LA diam:        3.40 cm 2.25 cm/m   RA Area:     13.10 cm LA Vol (A2C):   50.2 ml 33.24 ml/m  RA Volume:   28.50 ml  18.87 ml/m LA Vol (A4C):   35.7 ml 23.64 ml/m LA Biplane Vol: 44.6 ml 29.53 ml/m  AORTIC VALVE                    PULMONIC VALVE AV Area (Vmax):    2.30 cm     PV Vmax:       0.83 m/s AV Area (Vmean):   2.29 cm     PV Peak  grad:  2.8 mmHg AV Area (VTI):     2.57 cm AV Vmax:           142.00 cm/s AV Vmean:          86.250 cm/s AV VTI:            0.295 m AV Peak Grad:      8.1 mmHg AV Mean Grad:      3.5 mmHg LVOT Vmax:         104.00 cm/s LVOT Vmean:        63.000 cm/s LVOT VTI:  0.241 m LVOT/AV VTI ratio: 0.82  AORTA Ao Root diam: 3.50 cm MITRAL VALVE               TRICUSPID VALVE MV Area (PHT): 4.29 cm    TR Peak grad:   37.7 mmHg MV Area VTI:   2.22 cm    TR Vmax:        307.00 cm/s MV Peak grad:  4.2 mmHg MV Mean grad:  2.0 mmHg    SHUNTS MV Vmax:       1.03 m/s    Systemic VTI:  0.24 m MV Vmean:      59.2 cm/s   Systemic Diam: 2.00 cm MV Decel Time: 177 msec MV E velocity: 90.40 cm/s MV A velocity: 93.80 cm/s MV E/A ratio:  0.96 Carlyle Dolly MD Electronically signed by Carlyle Dolly MD Signature Date/Time: 11/24/2021/4:11:35 PM    Final     Cardiac Studies    Patient Profile     MICHAELLE BOTTOMLEY is a 76 y.o. female with a hx of hepatocellular carcarinoma s/p prior surgery, chronic pain with arthritis, prior tobacco abuse, severe COPD with chronic respiratory failure (followed by pulm), chronic diastolic CHF with severe pulm HTN, fibromyalgia, macular degeneration, 4.2cm ascending TAA by CT 2019 (pt declined further eval), R atrial mass by echo 2019 (pt declined further eval) who is being seen 11/24/2021 for the evaluation of afib RVR at the request of Dr. Doren Custard. Assessment & Plan    1.Afib - new diagnosis this admission. Admitted after suicide attempt, ingested 28 excedrin migraine pills.  - developed afib with RVR, started on dilt gtt. Self converted yesterday, remains in NSR this AM - started on lopressor '25mg'$  tid, afternoon and PM doses held due to HRs low 60s it appears. Change to '25mg'$  bid scheduled today - soft bp's, started on midodrine and home bp meds held. See if can wean midodrine and tolerate av nodal agents without it - she has refused anticoagulation, she understands the associated stroke  risk -unclear if isolated episode in setting of very high stimulant intake (caffeine from excedrin pills). If she ever were to be open to anticoag could consider outpatient monitor to verify recurrence.    2. Suicide attempt - ingested 28 excedirin migraine tablets, though some vomiting at time of ingestion.  - acetaminophen, salicylate levels have not been significantly elevated   Follow HRs and bp's today on lopressor '25mg'$  bid, midodrine 2.'5mg'$  tid. BP on my check 150s/70s, will d/c midodrine.   3. HTN - home bp's on hold - low bp's initially on dilt gtt, started on midodrine  - bp's 150s/70s on my check, d/c midodrine and monitor on lopressor alone.     For questions or updates, please contact [facility] [place] Please consult [email or web address] for contact info under        Signed, Carlyle Dolly, MD  11/25/2021, 7:57 AM

## 2021-11-25 NOTE — Progress Notes (Signed)
Inpatient Behavioral Health Placement  Pt meets inpatient criteria per Garrison Columbus, NP. There are no available beds at [facility] or [facility].  Referral was sent to the following facilities;   Destination Service Provider Address Phone Fax  [facility]  [address] [place] [address] [phone]  [facility]  [address], [place] [address] [phone] [facility]  [place], [place] [address] [phone]  [facility]  [place], [place] [address] [phone]  [facility]  [address] [place] [address] [place]  [facility]  [address] [place] [address] [phone]  [place] [place] [address] [phone]  [facility]  [address], [place] [address] [phone] [facility]  [address], [place] [address] [phone] [place]  [address] [place] [address] [phone]  [facility]  [place], [place] [address] [phone]  [place] [place] [address] [phone]  [facility]  [place] [address] [phone]  [place], [place] [address] [phone] [facility]  [place], [place] [address] [phone] [facility]  [address] [place] [address] [phone] [facility]  [place],  [place] [address] [phone]  [facility]  [address] [place] [address] [phone]  [facility]  [address] [place] [address] [phone] [place] [address], [place] [address] [phone]    Situation ongoing,  CSW will follow up.   Benjaman Kindler, MSW, LCSWA 11/25/2021  @ 10:40 PM

## 2021-11-26 DIAGNOSIS — J9611 Chronic respiratory failure with hypoxia: Secondary | ICD-10-CM | POA: Diagnosis not present

## 2021-11-26 DIAGNOSIS — I4891 Unspecified atrial fibrillation: Secondary | ICD-10-CM | POA: Diagnosis not present

## 2021-11-26 DIAGNOSIS — F332 Major depressive disorder, recurrent severe without psychotic features: Secondary | ICD-10-CM

## 2021-11-26 DIAGNOSIS — T50902D Poisoning by unspecified drugs, medicaments and biological substances, intentional self-harm, subsequent encounter: Secondary | ICD-10-CM

## 2021-11-26 DIAGNOSIS — F418 Other specified anxiety disorders: Secondary | ICD-10-CM | POA: Diagnosis not present

## 2021-11-26 DIAGNOSIS — F1120 Opioid dependence, uncomplicated: Secondary | ICD-10-CM | POA: Diagnosis not present

## 2021-11-26 DIAGNOSIS — R7302 Impaired glucose tolerance (oral): Secondary | ICD-10-CM

## 2021-11-26 DIAGNOSIS — T50902A Poisoning by unspecified drugs, medicaments and biological substances, intentional self-harm, initial encounter: Secondary | ICD-10-CM | POA: Diagnosis present

## 2021-11-26 MED ORDER — METOPROLOL TARTRATE 25 MG PO TABS
12.5000 mg | ORAL_TABLET | Freq: Two times a day (BID) | ORAL | Status: DC
Start: 2021-11-26 — End: 2021-12-07
  Administered 2021-11-26 – 2021-12-06 (×18): 12.5 mg via ORAL
  Filled 2021-11-26 (×20): qty 1

## 2021-11-26 MED ORDER — SENNA 8.6 MG PO TABS
2.0000 | ORAL_TABLET | Freq: Every day | ORAL | Status: DC
  Administered 2021-11-26 – 2021-12-01 (×5): 17.2 mg via ORAL
  Filled 2021-11-26 (×6): qty 2

## 2021-11-26 MED ORDER — POLYETHYLENE GLYCOL 3350 17 G PO PACK
17.0000 g | PACK | Freq: Every day | ORAL | Status: DC
  Administered 2021-11-26 – 2021-12-04 (×4): 17 g via ORAL
  Filled 2021-11-26 (×7): qty 1

## 2021-11-26 NOTE — Progress Notes (Signed)
Tina Bailey doing Physiological scientist , Nursing staff brought into room for pr. Pt able to make needs known. Pt still making verbal threat to do self harm. Will continue to monitor. 1:1 at bedside

## 2021-11-26 NOTE — Progress Notes (Signed)
Patient is refusing incruse dpi , she will take dulera. Mdi.

## 2021-11-26 NOTE — Progress Notes (Addendum)
Progress Note  Patient Name: Tina Bailey Date of Encounter: 11/26/2021  Primary Cardiologist: Buford Dresser, MD  Subjective   Mumbling that she continues to be in pain all over, sitter at bedside. No difficulty breathing.  Inpatient Medications    Scheduled Meds:  Chlorhexidine Gluconate Cloth  6 each Topical Q0600   cholecalciferol  2,000 Units Oral QPC lunch   enoxaparin (LOVENOX) injection  40 mg Subcutaneous Q24H   FLUoxetine  20 mg Oral Daily   gabapentin  300 mg Oral QHS   metoprolol tartrate  25 mg Oral BID   mirtazapine  15 mg Oral QHS   mometasone-formoterol  2 puff Inhalation BID   multivitamin with minerals  1 tablet Oral Daily   pantoprazole  40 mg Oral Daily   risperiDONE  0.25 mg Oral [id number]   senna  1 tablet Oral Daily   umeclidinium bromide  1 puff Inhalation Daily   Continuous Infusions:  PRN Meds: acetaminophen **OR** acetaminophen, HYDROcodone-acetaminophen, ipratropium-albuterol, ondansetron **OR** ondansetron (ZOFRAN) IV   Vital Signs    Vitals:   11/25/21 2000 11/25/21 2110 11/25/21 2257 11/26/21 0515  BP:  (!) 89/49 (!) 100/54 110/62  Pulse: 62   78  Resp: 10   20  Temp:    97.9 F (36.6 C)  TempSrc:    Oral  SpO2: 98%   100%  Weight:      Height:        Intake/Output Summary (Last 24 hours) at 11/26/2021 0807 Last data filed at 11/25/2021 1630 Gross per 24 hour  Intake 240 ml  Output --  Net 240 ml      11/24/2021   12:08 PM 11/22/2021    3:10 PM 08/23/2021    9:41 AM  Last 3 Weights  Weight (lbs) 121 lb 7.6 oz 130 lb 15.3 oz 131 lb  Weight (kg) 55.1 kg 59.4 kg 59.421 kg     Telemetry    NSR, occasional PVCs - Personally Reviewed  Physical Exam   GEN: No acute distress.  HEENT: Normocephalic, atraumatic, sclera non-icteric. Neck: No JVD or bruits. Cardiac: RRR no murmurs, rubs, or gallops.  Respiratory: Clear to auscultation bilaterally. Breathing is unlabored. GI: Soft, nontender, non-distended, BS +x 4. MS:  no deformity. Extremities: No clubbing or cyanosis. No edema. Distal pedal pulses are 2+ and equal bilaterally. Neuro:  A+O to self but does not comprehensibly answer place, date. Follows commands. Psych:  Guarded affect, mumbling.  Labs    High Sensitivity Troponin:  No results for input(s): "TROPONINIHS" in the last 720 hours.    Cardiac EnzymesNo results for input(s): "TROPONINI" in the last 168 hours. No results for input(s): "TROPIPOC" in the last 168 hours.   Chemistry Recent Labs  Lab 11/22/21 1600 11/22/21 1923 11/24/21 0721 11/25/21 0532  NA 135  --  137 137  K 3.6  --  3.9 4.2  CL 98  --  101 102  CO2 27  --  27 30  GLUCOSE 95  --  105* 89  BUN 14  --  18 21  CREATININE 0.59  --  0.64 0.68  CALCIUM 8.8*  --  8.8* 8.7*  PROT 6.9 7.1  --   --   ALBUMIN 3.8 3.9  --   --   AST 18 19  --   --   ALT 11 13  --   --   ALKPHOS 67 68  --   --   BILITOT 0.9 0.8  --   --  GFRNONAA >60  --  >60 >60  ANIONGAP 10  --  9 5     Hematology Recent Labs  Lab 11/22/21 1600 11/24/21 0721  WBC 8.9 7.1  RBC 3.66* 4.07  HGB 11.1* 12.3  HCT 34.5* 39.6  MCV 94.3 97.3  MCH 30.3 30.2  MCHC 32.2 31.1  RDW 13.7 14.0  PLT 230 228    BNPNo results for input(s): "BNP", "PROBNP" in the last 168 hours.   DDimer No results for input(s): "DDIMER" in the last 168 hours.   Radiology    ECHOCARDIOGRAM COMPLETE  Result Date: 11/24/2021    ECHOCARDIOGRAM REPORT   Patient Name:   Tina Bailey Date of Exam: 11/24/2021 Medical Rec #:  [id number]         Height:       60.0 in Accession #:    [id number]        Weight:       121.5 lb Date of Birth:  02/01/1946        BSA:          1.510 m Patient Age:    76 years          BP:           109/42 mmHg Patient Gender: F                 HR:           67 bpm. Exam Location:  [facility] Procedure: 2D Echo, Cardiac Doppler and Color Doppler Indications:    Atrial Fibrillation  History:        Patient has prior history of Echocardiogram examinations,  most                 recent 03/02/2018. CHF, Pulmonary HTN and COPD,                 Arrythmias:Atrial Fibrillation; Risk Factors:Former Smoker.  Sonographer:    Wenda Low Referring Phys: [phone] DAVID TAT IMPRESSIONS  1. Left ventricular ejection fraction, by estimation, is 60 to 65%. The left ventricle has normal function. The left ventricle has no regional wall motion abnormalities. There is mild left ventricular hypertrophy. Left ventricular diastolic parameters are consistent with Grade I diastolic dysfunction (impaired relaxation).  2. Right ventricular systolic function is normal. The right ventricular size is normal. There is mildly elevated pulmonary artery systolic pressure.  3. Linear density in right atrium poorly visualized, unclear etiology.  4. The mitral valve is abnormal. Mild mitral valve regurgitation. No evidence of mitral stenosis.  5. The tricuspid valve is abnormal.  6. The aortic valve is tricuspid. There is mild calcification of the aortic valve. There is mild thickening of the aortic valve. Aortic valve regurgitation is not visualized. No aortic stenosis is present. FINDINGS  Left Ventricle: Left ventricular ejection fraction, by estimation, is 60 to 65%. The left ventricle has normal function. The left ventricle has no regional wall motion abnormalities. The left ventricular internal cavity size was normal in size. There is  mild left ventricular hypertrophy. Left ventricular diastolic parameters are consistent with Grade I diastolic dysfunction (impaired relaxation). Normal left ventricular filling pressure. Right Ventricle: The right ventricular size is normal. Right vetricular wall thickness was not well visualized. Right ventricular systolic function is normal. There is mildly elevated pulmonary artery systolic pressure. The tricuspid regurgitant velocity  is 3.07 m/s, and with an assumed right atrial pressure of 3 mmHg, the estimated right ventricular systolic pressure is 40.7  mmHg. Left Atrium: Left atrial size was normal in size. Right Atrium: Linear density in right atrium poorly visualized, unclear etiology. Right atrial size was normal in size. Pericardium: There is no evidence of pericardial effusion. Mitral Valve: The mitral valve is abnormal. Mild mitral valve regurgitation. No evidence of mitral valve stenosis. MV peak gradient, 4.2 mmHg. The mean mitral valve gradient is 2.0 mmHg. Tricuspid Valve: The tricuspid valve is abnormal. Tricuspid valve regurgitation is mild . No evidence of tricuspid stenosis. Aortic Valve: The aortic valve is tricuspid. There is mild calcification of the aortic valve. There is mild thickening of the aortic valve. There is mild aortic valve annular calcification. Aortic valve regurgitation is not visualized. No aortic stenosis  is present. Aortic valve mean gradient measures 3.5 mmHg. Aortic valve peak gradient measures 8.1 mmHg. Aortic valve area, by VTI measures 2.57 cm. Pulmonic Valve: The pulmonic valve was not well visualized. Pulmonic valve regurgitation is not visualized. No evidence of pulmonic stenosis. Aorta: The aortic root is normal in size and structure. Venous: Small IVC suggesting low RA pressure and hypovolemia. IAS/Shunts: No atrial level shunt detected by color flow Doppler.  LEFT VENTRICLE PLAX 2D LVIDd:         4.30 cm   Diastology LVIDs:         2.90 cm   LV e' medial:    6.85 cm/s LV PW:         0.90 cm   LV E/e' medial:  13.2 LV IVS:        1.20 cm   LV e' lateral:   8.92 cm/s LVOT diam:     2.00 cm   LV E/e' lateral: 10.1 LV SV:         76 LV SV Index:   50 LVOT Area:     3.14 cm  RIGHT VENTRICLE RV Basal diam:  3.35 cm RV Mid diam:    2.10 cm RV S prime:     11.10 cm/s TAPSE (M-mode): 1.8 cm LEFT ATRIUM             Index        RIGHT ATRIUM           Index LA diam:        3.40 cm 2.25 cm/m   RA Area:     13.10 cm LA Vol (A2C):   50.2 ml 33.24 ml/m  RA Volume:   28.50 ml  18.87 ml/m LA Vol (A4C):   35.7 ml 23.64 ml/m LA  Biplane Vol: 44.6 ml 29.53 ml/m  AORTIC VALVE                    PULMONIC VALVE AV Area (Vmax):    2.30 cm     PV Vmax:       0.83 m/s AV Area (Vmean):   2.29 cm     PV Peak grad:  2.8 mmHg AV Area (VTI):     2.57 cm AV Vmax:           142.00 cm/s AV Vmean:          86.250 cm/s AV VTI:            0.295 m AV Peak Grad:      8.1 mmHg AV Mean Grad:      3.5 mmHg LVOT Vmax:         104.00 cm/s LVOT Vmean:        63.000 cm/s LVOT VTI:  0.241 m LVOT/AV VTI ratio: 0.82  AORTA Ao Root diam: 3.50 cm MITRAL VALVE               TRICUSPID VALVE MV Area (PHT): 4.29 cm    TR Peak grad:   37.7 mmHg MV Area VTI:   2.22 cm    TR Vmax:        307.00 cm/s MV Peak grad:  4.2 mmHg MV Mean grad:  2.0 mmHg    SHUNTS MV Vmax:       1.03 m/s    Systemic VTI:  0.24 m MV Vmean:      59.2 cm/s   Systemic Diam: 2.00 cm MV Decel Time: 177 msec MV E velocity: 90.40 cm/s MV A velocity: 93.80 cm/s MV E/A ratio:  0.96 Carlyle Dolly MD Electronically signed by Carlyle Dolly MD Signature Date/Time: 11/24/2021/4:11:35 PM    Final     Cardiac Studies   2D Echo 11/24/21   1. Left ventricular ejection fraction, by estimation, is 60 to 65%. The  left ventricle has normal function. The left ventricle has no regional  wall motion abnormalities. There is mild left ventricular hypertrophy.  Left ventricular diastolic parameters  are consistent with Grade I diastolic dysfunction (impaired relaxation).   2. Right ventricular systolic function is normal. The right ventricular  size is normal. There is mildly elevated pulmonary artery systolic  pressure.   3. Linear density in right atrium poorly visualized, unclear etiology.   4. The mitral valve is abnormal. Mild mitral valve regurgitation. No  evidence of mitral stenosis.   5. The tricuspid valve is abnormal.   6. The aortic valve is tricuspid. There is mild calcification of the  aortic valve. There is mild thickening of the aortic valve. Aortic valve  regurgitation is not  visualized. No aortic stenosis is present.   Patient Profile     76 y.o. female with hx of hepatocellular carcarinoma s/p prior surgery, chronic pain with arthritis, prior tobacco abuse, severe COPD with chronic respiratory failure (followed by pulm), chronic diastolic CHF with severe pulm HTN, fibromyalgia, macular degeneration, 4.2cm ascending TAA by CT 2019 (pt declined further eval), R atrial mass by echo 2019 (pt declined further eval). Presented to [facility] after having taken 28 Excedrin pills day prior to admission in suicide attempt. While awaiting geri-psych bed, went into AF RVR AM of 11/24/21, spontaneously converted to NSR.  Assessment & Plan    1. Suicide attempt - per notes, 28 Excedrine Migraine tablets taken 9/3 but vomitus in trash can - acetaminophen, salicyliate level were still wnl on arrival, no caffeine level obtained - awaiting geri-psych bed   2. New onset atrial fib with RVR - converted with IV diltiazem on 11/24/21, maintaining NSR with rare PVCs since then on metoprolol - unclear if related to adrenergic response to caffeine ingestion in #1 - her metoprolol is periodically being held for soft BP - though this agent does not usually produce significant BP lowering, will decrease dose to 12.'5mg'$  BID today - CHADSVASC is 5 for CHF, aortic atherosclerosis, female, age but patient refuses anticoagulation - can consider repeat monitor as OP if she decides to reconsider   3. Chronic diastolic CHF - volume status looks OK, did not require loop diuretic PTA - holding home amlodipine, spironolactone due to soft BP   4. Prolonged QT on arrival - potentially med related, improved on follow-up tracings - electrolyte management per primary team   5. Right atrial mass on echo 2019 -  pt declined further workup at the time - 2D echo this admission EF 60-65%, G1DD, mild MR, linear density in right atrium poorly visualized, unclear etiology - continue OP f/u   6. 4.2cm ascending TAA in  2019 - pt declined further workup previously for this   Cardiology f/u arranged and placed on AVS.  Gary will sign off.   Medication Recommendations:  metoprolol 12.5 mg BID Other recommendations (labs, testing, etc):  None Follow up as an outpatient: Scheduled for 9/25   For questions or updates, please contact [facility] Please consult [email or web address] for contact info under Cardiology/STEMI.  Signed, Charlie Pitter, PA-C 11/26/2021, 8:07 AM    Patient seen and examined.  Agree with above documentation.  On exam, patient is oriented x3, regular rate and rhythm, no murmurs, lungs CTAB, no LE edema.  BP has been soft, will decrease Lopressor to 12.5 mg twice daily.  Follow-up scheduled for 9/25.  Cardiology will sign off at this time.  Donato Heinz, MD

## 2021-11-26 NOTE — Progress Notes (Signed)
Patient has been denied by [facility] due to no appropriate beds available. Patient meets [facility] [place] inpatient criteria per Garrison Columbus, NP. Patient has been faxed out to the following facilities:    [facility]  [address], [place] [address] [phone]  [place]  [address], [place] [address] [phone] [facility]  [place], [place] [address] [phone] [place]  [place], [place] [address] [phone]  [facility]  [address] [place] [address] [place]  [facility]  [address] [place] [address] [phone] [place] [address] [place] [address] [phone]  [facility]  [name], [place] [address] [phone] [place]  [address], [place] [address] [phone] [place]  [address] [place] [address] [phone]  [facility]  [place], [place] [address] [phone]  [place] [place] [address] [phone]  [facility]  [place] [address] [phone]  [place] [place] [address] [phone] [facility]  [place], [place] [address] [phone] [facility]  [address] [place] [address] [phone] [facility]  [place], [place] [address] [phone]   [facility]  [address] [place] [address] [phone]  [facility]  [address], [place] [address] [phone] [place] [address], [place] [address] [phone] [place], MSW, LCSW-A  11:24 PM 11/26/2021

## 2021-11-26 NOTE — Progress Notes (Signed)
CSW received a phone call from [place] pt is under review. CSW call back to [phone] requesting call back about referral. CSW will assist and follow with inpatient behavioral health placement.   Benjaman Kindler, MSW, [facility] 11/26/2021 8:18 AM

## 2021-11-26 NOTE — Care Management Important Message (Signed)
Important Message  Patient Details  Name: Tina Bailey MRN: [id number] Date of Birth: Apr 22, 1945   Medicare Important Message Given:  Yes (copy provided to daughter Terril Amaro via email as requested)     Tommy Medal 11/26/2021, 2:02 PM

## 2021-11-26 NOTE — Progress Notes (Signed)
PROGRESS NOTE  ELLYANNA Bailey ENI:[id number] DOB: [birth year] DOA: 11/22/2021 PCP: Vilinda Boehringer, NP  Brief History:  76 year old female with a history of COPD, hepatocellular carcinoma, opioid dependence with chronic abdominal and chronic back pain, depression, tobacco abuse, hypertension, chronic respiratory failure on 2 L presenting with suicide attempt.  The patient presented to the ED on 11/22/2021 after taking 28 Excedrin on the evening of 11/21/2021.  She began having nausea and vomiting throughout the night from 11/21/2021 to 11/22/2021.  She notified her daughter on the morning of 11/22/2021 and she was brought to the emergency department for further evaluation.  [facility].  The patient was evaluated by psychiatry who recommended inpatient evaluation and treatment.  While waiting for an inpatient [facility] psychiatric bed, the patient developed atrial fibrillation with RVR on the morning of 11/24/2021 with heart rate in the 170s.  She was given a diltiazem bolus, 5 mg and then started on a drip.  Admission was requested. The patient denies any fever, chills, headache, chest pain, shortness breath, vomiting, diarrhea.  She has chronic nausea.  She has not had a bowel movement.  There is no hematochezia or melena.  She denies any coughing or hemoptysis. In the ED, the patient was afebrile hemodynamically stable albeit with soft blood pressures after starting diltiazem.  Oxygen saturation is 96% on 2 L.  WBC 7.1, hemoglobin 12.3, platelets 228,000.  BMP showed sodium 137, potassium 3.9, bicarbonate 27, serum creatinine 0.64.  EKG showed atrial fibrillation with nonspecific ST-T wave change.  Urine drug screen was positive for opiates.  Cardiology was consulted to assist with management.  Fortunately, patient spontaneously converted to sinus in late morning 11/24/21 and she remained in sinus thereafter.  Cardiology changed her regimen to metoprolol 25 mg bid and midodrine was  stopped.  Patient remained stable without new afib.  She is cleared medically to go to [facility] facility.    Assessment and Plan: Depression with anxiety Currently with suicidal ideation [place] telepsychiatry to follow>>recommended d/c to inpatient [facility] facility Continue fluoxetine, mirtazapine Medically stable to go to [facility]  Impaired glucose tolerance 05/24/21 A1C--6.2 CBG controlled during hospitalization Outpatient PCP follow up  Essential hypertension Stopped amlodipine and spironolactone to allow BP margin to titrate metoprolol --BP controlled  Opioid dependence ([place]) PDMP reviewed --norco 10/325 #120 monthly  Atrial fibrillation with RVR ([place]) CHADSVASc = 5 Continue diltiazem drip>>stopped after patient spontaneously converted to sinus in late morning 11/24/21 Cardiology consulted>>initially placed on metoprolol tid>>bid Now back in sinus with PACs 11/24/21 Echo EF 60-65%, G1DD, mild TR/MR, RVSP 40.7 TSH--0.575 Pt remained in sinus and hemodynamically stable and was cleared for d/c by cardiology Patient refuses anticoagulation after explanation of risks/benefits/alternatives -9/8 metoprolol decreased to 12.5 mg bid due to soft BP --episode may have been triggered by patient's excess caffeine intake and from Excedrin  Chronic respiratory failure with hypoxia and hypercapnia ([place]) Chronically on 2L stable  Tobacco abuse Tobacco cessation discussed  COPD (chronic obstructive pulmonary disease) ([place]) Continue Breztri  Hepatocellular carcinoma ([place]) Currently under care of Dr. Burr Medico at [facility] --over 5 yrs from diagnosis --sp partical hepatectomy --under observation, not on active tx        Family Communication:  no Family at bedside  Consultants:  telepsychiatry  Code Status:  DNR  DVT Prophylaxis:  [place] Lovenox   Procedures: As Listed in Progress Note Above  Antibiotics: None  Subjective: Complains of pain all over.  Denies sob,  n/v/d  Objective: Vitals:   11/25/21 2257 11/26/21 0515 11/26/21 0808 11/26/21 1327  BP: (!) 100/54 110/62  123/62  Pulse:  78  65  Resp:  20  18  Temp:  97.9 F (36.6 C)  98.5 F (36.9 C)  TempSrc:  Oral  Oral  SpO2:  100% 98% 99%  Weight:      Height:        Intake/Output Summary (Last 24 hours) at 11/26/2021 1834 Last data filed at 11/26/2021 1754 Gross per 24 hour  Intake 810 ml  Output 75 ml  Net 735 ml   Weight change:  Exam:  General:  Pt is alert, follows commands appropriately, not in acute distress HEENT: No icterus, No thrush, No neck mass, [place]/AT Cardiovascular: RRR, S1/S2, no rubs, no gallops Respiratory: diminished BS.  Bibasilar crackles. No wheeze Abdomen: Soft/+BS, non tender, non distended, no guarding Extremities: No edema, No lymphangitis, No petechiae, No rashes, no synovitis   Data Reviewed: I have personally reviewed following labs and imaging studies Basic Metabolic Panel: Recent Labs  Lab 11/22/21 1600 11/24/21 0721 11/25/21 0532  NA 135 137 137  K 3.6 3.9 4.2  CL 98 101 102  CO2 '27 27 30  '$ GLUCOSE 95 105* 89  BUN '14 18 21  '$ CREATININE 0.59 0.64 0.68  CALCIUM 8.8* 8.8* 8.7*  MG 2.1 2.1 2.0   Liver Function Tests: Recent Labs  Lab 11/22/21 1600 11/22/21 1923  AST 18 19  ALT 11 13  ALKPHOS 67 68  BILITOT 0.9 0.8  PROT 6.9 7.1  ALBUMIN 3.8 3.9   No results for input(s): "LIPASE", "AMYLASE" in the last 168 hours. No results for input(s): "AMMONIA" in the last 168 hours. Coagulation Profile: Recent Labs  Lab 11/24/21 0721  INR 1.1   CBC: Recent Labs  Lab 11/22/21 1600 11/24/21 0721  WBC 8.9 7.1  NEUTROABS 7.5  --   HGB 11.1* 12.3  HCT 34.5* 39.6  MCV 94.3 97.3  PLT 230 228   Cardiac Enzymes: No results for input(s): "CKTOTAL", "CKMB", "CKMBINDEX", "TROPONINI" in the last 168 hours. BNP: Invalid input(s): "POCBNP" CBG: No results for input(s): "GLUCAP" in the last 168 hours. HbA1C: No results for input(s):  "HGBA1C" in the last 72 hours. Urine analysis:    Component Value Date/Time   COLORURINE YELLOW 07/02/2021 1523   APPEARANCEUR HAZY (A) 07/02/2021 1523   LABSPEC 1.016 07/02/2021 1523   PHURINE 7.0 07/02/2021 1523   GLUCOSEU NEGATIVE 07/02/2021 1523   HGBUR NEGATIVE 07/02/2021 1523   BILIRUBINUR NEGATIVE 07/02/2021 1523   BILIRUBINUR negative 05/07/2021 1618   BILIRUBINUR negative 05/15/2018 0859   KETONESUR NEGATIVE 07/02/2021 1523   PROTEINUR NEGATIVE 07/02/2021 1523   UROBILINOGEN 0.2 05/07/2021 1618   NITRITE NEGATIVE 07/02/2021 1523   LEUKOCYTESUR NEGATIVE 07/02/2021 1523   Sepsis Labs: '@LABRCNTIP'$ (procalcitonin:4,lacticidven:4) ) Recent Results (from the past 240 hour(s))  SARS Coronavirus 2 by RT PCR (hospital order, performed in [facility] hospital lab) *cepheid single result test* Anterior Nasal Swab     Status: None   Collection Time: 11/22/21  4:26 PM   Specimen: Anterior Nasal Swab  Result Value Ref Range Status   SARS Coronavirus 2 by RT PCR NEGATIVE NEGATIVE Final    Comment: (NOTE) SARS-CoV-2 target nucleic acids are NOT DETECTED.  The SARS-CoV-2 RNA is generally detectable in upper and lower respiratory specimens during the acute phase of infection. The lowest concentration of SARS-CoV-2 viral copies  this assay can detect is 250 copies / mL. A negative result does not preclude SARS-CoV-2 infection and should not be used as the sole basis for treatment or other patient management decisions.  A negative result may occur with improper specimen collection / handling, submission of specimen other than nasopharyngeal swab, presence of viral mutation(s) within the areas targeted by this assay, and inadequate number of viral copies (<250 copies / mL). A negative result must be combined with clinical observations, patient history, and epidemiological information.  Fact Sheet for Patients:   https://www.patel.info/  Fact Sheet for Healthcare  Providers: https://hall.com/  This test is not yet approved or  cleared by the [place] FDA and has been authorized for detection and/or diagnosis of SARS-CoV-2 by FDA under an Emergency Use Authorization (EUA).  This EUA will remain in effect (meaning this test can be used) for the duration of the COVID-19 declaration under Section 564(b)(1) of the Act, 21 U.S.C. section 360bbb-3(b)(1), unless the authorization is terminated or revoked sooner.  Performed at [facility], [address], [place], [place] [address]   MRSA Next Gen by PCR, Nasal     Status: None   Collection Time: 11/24/21 11:55 AM   Specimen: Nasal Mucosa; Nasal Swab  Result Value Ref Range Status   MRSA by PCR Next Gen NOT DETECTED NOT DETECTED Final    Comment: (NOTE) The GeneXpert MRSA Assay (FDA approved for NASAL specimens only), is one component of a comprehensive MRSA colonization surveillance program. It is not intended to diagnose MRSA infection nor to guide or monitor treatment for MRSA infections. Test performance is not FDA approved in patients less than 93 years old. Performed at [facility], [address], [place], [place] [address]      Scheduled Meds:  Chlorhexidine Gluconate Cloth  6 each Topical Q0600   cholecalciferol  2,000 Units Oral QPC lunch   enoxaparin (LOVENOX) injection  40 mg Subcutaneous Q24H   FLUoxetine  20 mg Oral Daily   gabapentin  300 mg Oral QHS   metoprolol tartrate  12.5 mg Oral BID   mirtazapine  15 mg Oral QHS   mometasone-formoterol  2 puff Inhalation BID   multivitamin with minerals  1 tablet Oral Daily   pantoprazole  40 mg Oral Daily   polyethylene glycol  17 g Oral Daily   risperiDONE  0.25 mg Oral [id number]   senna  2 tablet Oral Daily   umeclidinium bromide  1 puff Inhalation Daily   Continuous Infusions:  Procedures/Studies: ECHOCARDIOGRAM COMPLETE  Result Date: 11/24/2021    ECHOCARDIOGRAM REPORT   Patient Name:   Tina Bailey Date of Exam: 11/24/2021 Medical Rec #:  [id number]         Height:       60.0 in Accession #:    [id number]        Weight:       121.5 lb Date of Birth:  1945/09/05        BSA:          1.510 m Patient Age:    85 years          BP:           109/42 mmHg Patient Gender: F                 HR:           67 bpm. Exam Location:  [facility] Procedure: 2D Echo, Cardiac Doppler and Color Doppler Indications:  Atrial Fibrillation  History:        Patient has prior history of Echocardiogram examinations, most                 recent 03/02/2018. CHF, Pulmonary HTN and COPD,                 Arrythmias:Atrial Fibrillation; Risk Factors:Former Smoker.  Sonographer:    Wenda Low Referring Phys: [phone] Marijean Montanye IMPRESSIONS  1. Left ventricular ejection fraction, by estimation, is 60 to 65%. The left ventricle has normal function. The left ventricle has no regional wall motion abnormalities. There is mild left ventricular hypertrophy. Left ventricular diastolic parameters are consistent with Grade I diastolic dysfunction (impaired relaxation).  2. Right ventricular systolic function is normal. The right ventricular size is normal. There is mildly elevated pulmonary artery systolic pressure.  3. Linear density in right atrium poorly visualized, unclear etiology.  4. The mitral valve is abnormal. Mild mitral valve regurgitation. No evidence of mitral stenosis.  5. The tricuspid valve is abnormal.  6. The aortic valve is tricuspid. There is mild calcification of the aortic valve. There is mild thickening of the aortic valve. Aortic valve regurgitation is not visualized. No aortic stenosis is present. FINDINGS  Left Ventricle: Left ventricular ejection fraction, by estimation, is 60 to 65%. The left ventricle has normal function. The left ventricle has no regional wall motion abnormalities. The left ventricular internal cavity size was normal in size. There is  mild left ventricular hypertrophy. Left ventricular diastolic  parameters are consistent with Grade I diastolic dysfunction (impaired relaxation). Normal left ventricular filling pressure. Right Ventricle: The right ventricular size is normal. Right vetricular wall thickness was not well visualized. Right ventricular systolic function is normal. There is mildly elevated pulmonary artery systolic pressure. The tricuspid regurgitant velocity  is 3.07 m/s, and with an assumed right atrial pressure of 3 mmHg, the estimated right ventricular systolic pressure is 32.3 mmHg. Left Atrium: Left atrial size was normal in size. Right Atrium: Linear density in right atrium poorly visualized, unclear etiology. Right atrial size was normal in size. Pericardium: There is no evidence of pericardial effusion. Mitral Valve: The mitral valve is abnormal. Mild mitral valve regurgitation. No evidence of mitral valve stenosis. MV peak gradient, 4.2 mmHg. The mean mitral valve gradient is 2.0 mmHg. Tricuspid Valve: The tricuspid valve is abnormal. Tricuspid valve regurgitation is mild . No evidence of tricuspid stenosis. Aortic Valve: The aortic valve is tricuspid. There is mild calcification of the aortic valve. There is mild thickening of the aortic valve. There is mild aortic valve annular calcification. Aortic valve regurgitation is not visualized. No aortic stenosis  is present. Aortic valve mean gradient measures 3.5 mmHg. Aortic valve peak gradient measures 8.1 mmHg. Aortic valve area, by VTI measures 2.57 cm. Pulmonic Valve: The pulmonic valve was not well visualized. Pulmonic valve regurgitation is not visualized. No evidence of pulmonic stenosis. Aorta: The aortic root is normal in size and structure. Venous: Small IVC suggesting low RA pressure and hypovolemia. IAS/Shunts: No atrial level shunt detected by color flow Doppler.  LEFT VENTRICLE PLAX 2D LVIDd:         4.30 cm   Diastology LVIDs:         2.90 cm   LV e' medial:    6.85 cm/s LV PW:         0.90 cm   LV E/e' medial:  13.2 LV  IVS:        1.20  cm   LV e' lateral:   8.92 cm/s LVOT diam:     2.00 cm   LV E/e' lateral: 10.1 LV SV:         76 LV SV Index:   50 LVOT Area:     3.14 cm  RIGHT VENTRICLE RV Basal diam:  3.35 cm RV Mid diam:    2.10 cm RV S prime:     11.10 cm/s TAPSE (M-mode): 1.8 cm LEFT ATRIUM             Index        RIGHT ATRIUM           Index LA diam:        3.40 cm 2.25 cm/m   RA Area:     13.10 cm LA Vol (A2C):   50.2 ml 33.24 ml/m  RA Volume:   28.50 ml  18.87 ml/m LA Vol (A4C):   35.7 ml 23.64 ml/m LA Biplane Vol: 44.6 ml 29.53 ml/m  AORTIC VALVE                    PULMONIC VALVE AV Area (Vmax):    2.30 cm     PV Vmax:       0.83 m/s AV Area (Vmean):   2.29 cm     PV Peak grad:  2.8 mmHg AV Area (VTI):     2.57 cm AV Vmax:           142.00 cm/s AV Vmean:          86.250 cm/s AV VTI:            0.295 m AV Peak Grad:      8.1 mmHg AV Mean Grad:      3.5 mmHg LVOT Vmax:         104.00 cm/s LVOT Vmean:        63.000 cm/s LVOT VTI:          0.241 m LVOT/AV VTI ratio: 0.82  AORTA Ao Root diam: 3.50 cm MITRAL VALVE               TRICUSPID VALVE MV Area (PHT): 4.29 cm    TR Peak grad:   37.7 mmHg MV Area VTI:   2.22 cm    TR Vmax:        307.00 cm/s MV Peak grad:  4.2 mmHg MV Mean grad:  2.0 mmHg    SHUNTS MV Vmax:       1.03 m/s    Systemic VTI:  0.24 m MV Vmean:      59.2 cm/s   Systemic Diam: 2.00 cm MV Decel Time: 177 msec MV E velocity: 90.40 cm/s MV A velocity: 93.80 cm/s MV E/A ratio:  0.96 Carlyle Dolly MD Electronically signed by Carlyle Dolly MD Signature Date/Time: 11/24/2021/4:11:35 PM    Final     Orson Eva, DO  [facility]  If 7PM-7AM, please contact night-coverage [email or web address] Password [facility] 11/26/2021, 6:34 PM   LOS: 2 days

## 2021-11-26 NOTE — Consult Note (Cosign Needed Addendum)
Telepsych Consultation   Reason for Consult:  intentional overdose Referring Physician:  Hayden Rasmussen, MD Location of Patient: A311-01 Location of Provider: [facility]  Patient Identification: Tina Bailey MRN:  [id number] Principal Diagnosis: Intentional overdose [facility]) Diagnosis:  Principal Problem:   Intentional overdose ([place]) Active Problems:   Hepatocellular carcinoma ([place])   Depression with anxiety   COPD (chronic obstructive pulmonary disease) ([place])   Tobacco abuse   Severe episode of recurrent major depressive disorder, without psychotic features ([place])   Chronic respiratory failure with hypoxia and hypercapnia ([place])   Atrial fibrillation with RVR ([place])   Opioid dependence ([place])   Essential hypertension   Impaired glucose tolerance   Total Time spent with patient: 45 minutes  Subjective:   Tina Bailey is a 76 y.o. female patient admitted with intentional overdose.  1001: "Not good at all. My stomach". Patient presents withdrawn; poor eye contact, lagged responses. Oriented to person, place, situation. "I took the whole bottle of pills. I didn't die. I screwed myself. I don't want to live. 7 years is a long time to be in pain. Time is up, time is up".   Patient reports suicidal ideations "a pretty good while". Currently living at [facility] [place] ([facility]F) but "doesn't like it"; reports having her own room, manages own medications. She is tangential and perseverative about "wanting to die"; mentions "the importance of living/dying" on her own terms. She continues to endorse active suicidal ideations and "not wanting to live"; expressed anger at herself that she "wasn't successful" in her attempt to end her life further stating "I took the wrong medications and they didn't work". She reports 7/10 pain which she states is the source of the major depression and suicidal thoughts due to the impact on her daily ADLs and life. Repeats several  times that she "no longer wants to live" at her current AFL saying she "will run away from the place" if forced to return. She denies any auditory or visual hallucinations. Lays in bed with eyes closed majority of the assessment. Provides verbal permission to speak to daughter for collateral information.   Per EDRN note 11/22/21 1802:"Patient is sticking her finger down her throat to try and make herself throw up. Notified RN".  Per EDRN note 11/23/21 0025:"Pt refused to cooperate with [facility] Counselor during TTS Consult. Pt kept pretending to Nod Off and sleep when questioned by the Counselor, but would respond and answer all questions by this RN at bedside during consult. Pt then reported she was just wanting to sleep and do the consult later."  Collateral: [name] (daughter) [phone] 24:53 Reports mom has spent time at [facility] for 10 days then 2-3 days later was seen at [facility], [facility], [facility] where she was transferred to [facility]. Depression was "off and on for a while"; felt it was better upon discharge from [facility] (Overdosed on Lorazepam). Went on vacation in [place] with daughter where she took it as a "farewell visit" telling her brother goodbye. Patient is originally from [place] and has not been happy since arriving to [place] to be with her daughter after experiencing decline. Reports some dysphoria at baseline, however admits decline in health/pain is an attributing factor to her mental health symptoms. States "when mother is not in pain she is a different person and over past month her pain has become encompassing". Does not feel mom is safe. Reports she is mom's medical and financial power of attorney. Tearful given current situation  with mother and not knowing how to move forward long-term; TOC consult placed.   HPI:  Tina Bailey is a 76 year old female with past psychiatric history of depression with anxiety, opioid dependence, severe episode of recurrent major depressive  disorder without psychotic features who presented to [facility] after intentionally ingesting 24 Excedrin pills in a suicide attempt. Per chart review patient noted to c/o of being "in a lot of pain and wants to die"; vomitus noted in her trashcan. Patient currently lives at [facility]. On admission patient noted to have prolonged QTC, elevated K and Mg requiring cardiac monitoring, IV NAC, and supportive care. She was cleared by Poison Control and later by EDP. Of note pt observed sticking her finger down her throat in attempt to induce vomit. Per chart review pt presented to [facility] 07/04/21 with "suicidal behavior" where she was transferred to [facility] gero-unit for inpatient,  [place] 07/25/21 for "suicidal ideation" where she was transferred to [facility] for gero-psychiatric admission. Acitve anti-depressants and anti-psychotropics. UDS+opiates, BAL<10. Last salicylate 85/88/50 27.7. PDMP reviewed, Clonazepam 0.5 mg (45 tabs) filled 10/20/21, Hydrocodone 10-'325mg'$  (120 tabs)  filled 10/27/21, Gabapentin 300 mg (3 tabs) filled 11/13/21.    Past Psychiatric History: depression with anxiety, opioid dependence, severe episode of recurrent major depressive disorder without psychotic features  Risk to Self:   Risk to Others:   Prior Inpatient Therapy:   Prior Outpatient Therapy:    Past Medical History:  Past Medical History:  Diagnosis Date   Arthritis    DDD. Right shoulder"is frozen"-limited ROM. osteoporosis.   Blue toe syndrome ([place]) 11/27/2014   Dr. Claudia Pollock evaluating   Chronic diastolic CHF (congestive heart failure) (HCC)    Chronic respiratory failure (HCC)    Complication of anesthesia    COPD (chronic obstructive pulmonary disease) ([place])    Fibromyalgia    Fracture of rib of right side    hx "osteoporosis"- states her dog nudge her on the side, next day developed great pain and was told has a fracture rib.   Liver cancer ([place]) 12/2014   Macular degeneration    R eye   Osteoporosis     PONV (postoperative nausea and vomiting)    nausea,severe vomiting after 01-13-15 portal vein embolization   Productive cough    Retina disorder    L eye, vision distorted, edema   Right atrial mass    Spine fracture due to birth trauma    Thoracic aortic aneurysm (TAA) ([place])    TMJ disease    Wears glasses     Past Surgical History:  Procedure Laterality Date   ANGIOPLASTY  2008   no stents required, no follow-up with cardiologist, no recurrent chest pain   CATARACT EXTRACTION, BILATERAL Bilateral    ESOPHAGOGASTRODUODENOSCOPY (EGD) WITH PROPOFOL N/A 06/25/2015   Procedure: ESOPHAGOGASTRODUODENOSCOPY (EGD) WITH PROPOFOL;  Surgeon: Milus Banister, MD;  Location: [facility] ENDOSCOPY;  Service: Endoscopy;  Laterality: N/A;  stent removal    EYE SURGERY     cornea surgery   INTRAMEDULLARY (IM) NAIL INTERTROCHANTERIC Right 12/04/2017   Procedure: INTRAMEDULLARY (IM) NAIL INTERTROCHANTRIC;  Surgeon: Rod Can, MD;  Location: [facility] ORS;  Service: Orthopedics;  Laterality: Right;   IR RADIOLOGIST EVAL & MGMT  03/29/2017   KIDNEY STONE SURGERY     LAPAROSCOPIC PARTIAL HEPATECTOMY N/A 03/04/2015   Procedure: DIAGNOSTIC LAPAROSCOPY, EXTENDED RIGHT HEPATECTOMY, WITH INTRAOPERATIVE ULTRASOUND;  Surgeon: Stark Klein, MD;  Location: [facility] ORS;  Service: General;  Laterality: N/A;   PARTIAL HYSTERECTOMY  portal vein embolization     01-13-15 -Dr. Cathlean Sauer.   Family History:  Family History  Problem Relation Age of Onset   Hypertension Mother    Atrial fibrillation Mother    Heart disease Mother        after age 97   Breast cancer Mother    Stroke Father    Dementia Father    Peptic Ulcer Father    Heart disease Brother        After age 42- A-Fib   Heart attack Brother    Family Psychiatric  History: not noted Social History:  Social History   Substance and Sexual Activity  Alcohol Use No   Alcohol/week: 0.0 standard drinks of alcohol     Social History   Substance and  Sexual Activity  Drug Use No    Social History   Socioeconomic History   Marital status: Divorced    Spouse name: Not on file   Number of children: 3   Years of education: Not on file   Highest education level: Not on file  Occupational History   Occupation: retired  Tobacco Use   Smoking status: Former    Packs/day: 1.00    Years: 45.00    Total pack years: 45.00    Types: Cigarettes    Quit date: 02/20/2018    Years since quitting: 3.7   Smokeless tobacco: Never  Vaping Use   Vaping Use: Never used  Substance and Sexual Activity   Alcohol use: No    Alcohol/week: 0.0 standard drinks of alcohol   Drug use: No   Sexual activity: Not Currently  Other Topics Concern   Not on file  Social History Narrative   Not on file   Social Determinants of Health   Financial Resource Strain: Not on file  Food Insecurity: No Food Insecurity (11/24/2021)   Hunger Vital Sign    Worried About Running Out of Food in the Last Year: Never true    Ran Out of Food in the Last Year: Never true  Transportation Needs: No Transportation Needs (11/24/2021)   PRAPARE - Hydrologist (Medical): No    Lack of Transportation (Non-Medical): No  Physical Activity: Not on file  Stress: Not on file  Social Connections: Not on file   Additional Social History:    Allergies:   Allergies  Allergen Reactions   Zoloft [Sertraline Hcl] Other (See Comments)    Zoloft, had a terrible reaction   Amoxicillin-Pot Clavulanate Nausea Only    Amoxacillin Augmentin combo  Has patient had a PCN reaction causing immediate rash, facial/tongue/throat swelling, SOB or lightheadedness with hypotension: No Has patient had a PCN reaction causing severe rash involving mucus membranes or skin necrosis: No Has patient had a PCN reaction that required hospitalization No Has patient had a PCN reaction occurring within the last 10 years: No If all of the above answers are "NO", then may proceed  with Cephalosporin use.    Clindamycin/Lincomycin Other (See Comments)    Felt like it "burned out" her stomach/took a large dose   Paxil [Paroxetine Hcl] Rash   Sulfa Antibiotics Rash    Labs:  Results for orders placed or performed during the hospital encounter of 11/22/21 (from the past 48 hour(s))  MRSA Next Gen by PCR, Nasal     Status: None   Collection Time: 11/24/21 11:55 AM   Specimen: Nasal Mucosa; Nasal Swab  Result Value Ref Range  MRSA by PCR Next Gen NOT DETECTED NOT DETECTED    Comment: (NOTE) The GeneXpert MRSA Assay (FDA approved for NASAL specimens only), is one component of a comprehensive MRSA colonization surveillance program. It is not intended to diagnose MRSA infection nor to guide or monitor treatment for MRSA infections. Test performance is not FDA approved in patients less than 98 years old. Performed at [facility], [address] [place], [place] [address]   Basic metabolic panel     Status: Abnormal   Collection Time: 11/25/21  5:32 AM  Result Value Ref Range   Sodium 137 135 - 145 mmol/L   Potassium 4.2 3.5 - 5.1 mmol/L   Chloride 102 98 - 111 mmol/L   CO2 30 22 - 32 mmol/L   Glucose, Bld 89 70 - 99 mg/dL    Comment: Glucose reference range applies only to samples taken after fasting for at least 8 hours.   BUN 21 8 - 23 mg/dL   Creatinine, Ser 0.68 0.44 - 1.00 mg/dL   Calcium 8.7 (L) 8.9 - 10.3 mg/dL   GFR, Estimated >60 >60 mL/min    Comment: (NOTE) Calculated using the CKD-EPI Creatinine Equation (2021)    Anion gap 5 5 - 15    Comment: Performed at [facility], [address] [place], [place] [address]  Magnesium     Status: None   Collection Time: 11/25/21  5:32 AM  Result Value Ref Range   Magnesium 2.0 1.7 - 2.4 mg/dL    Comment: Performed at [facility], [address], [place],  [address]    Medications:  Current Facility-Administered Medications  Medication Dose Route Frequency Provider Last Rate Last Admin    acetaminophen (TYLENOL) tablet 650 mg  650 mg Oral Q6H PRN Tat, David, MD       Or   acetaminophen (TYLENOL) suppository 650 mg  650 mg Rectal Q6H PRN Tat, [name], MD       Chlorhexidine Gluconate Cloth 2 % PADS 6 each  6 each Topical E3662 Orson Eva, MD   6 each at 11/25/21 1158   cholecalciferol (VITAMIN D3) 25 MCG (1000 UNIT) tablet 2,000 Units  2,000 Units Oral QPC lunch Hayden Rasmussen, MD   2,000 Units at 11/25/21 1315   enoxaparin (LOVENOX) injection 40 mg  40 mg Subcutaneous Q24H Tat, [name], MD   40 mg at 11/25/21 1316   FLUoxetine (PROZAC) capsule 20 mg  20 mg Oral Daily Hayden Rasmussen, MD   20 mg at 11/26/21 0909   gabapentin (NEURONTIN) capsule 300 mg  300 mg Oral QHS Hayden Rasmussen, MD   300 mg at 11/25/21 2109   HYDROcodone-acetaminophen ([place]) 10-325 MG per tablet 1 tablet  1 tablet Oral Q6H PRN Tat, [name], MD   1 tablet at 11/26/21 0914   ipratropium-albuterol (DUONEB) 0.5-2.5 (3) MG/3ML nebulizer solution 3 mL  3 mL Nebulization Q6H PRN Tat, David, MD       metoprolol tartrate (LOPRESSOR) tablet 12.5 mg  12.5 mg Oral BID Dunn, Dayna N, PA-C   12.5 mg at 11/26/21 0910   mirtazapine (REMERON) tablet 15 mg  15 mg Oral QHS Hayden Rasmussen, MD   15 mg at 11/25/21 2110   mometasone-formoterol (DULERA) 100-5 MCG/ACT inhaler 2 puff  2 puff Inhalation BID Hayden Rasmussen, MD   2 puff at 11/26/21 0808   multivitamin with minerals tablet 1 tablet  1 tablet Oral Daily Tat, [name], MD   1 tablet at 11/26/21  0910   ondansetron (ZOFRAN) tablet 4 mg  4 mg Oral Q6H PRN Tat, David, MD       Or   ondansetron (ZOFRAN) injection 4 mg  4 mg Intravenous Q6H PRN Tat, [name], MD       pantoprazole (PROTONIX) EC tablet 40 mg  40 mg Oral Daily Hayden Rasmussen, MD   40 mg at 11/26/21 0910   risperiDONE (RISPERDAL) tablet 0.25 mg  0.25 mg Oral [id number] Hayden Rasmussen, MD   0.25 mg at 11/26/21 9417   senna (SENOKOT) tablet 8.6 mg  1 tablet Oral Daily Tat, [name], MD   8.6 mg at 11/26/21 0910    umeclidinium bromide (INCRUSE ELLIPTA) 62.5 MCG/ACT 1 puff  1 puff Inhalation Daily Hayden Rasmussen, MD   1 puff at 11/23/21 4081    Musculoskeletal: Strength & Muscle Tone: within normal limits Gait & Station: normal Patient leans: N/A  Psychiatric Specialty Exam:  Presentation  General Appearance: Casual  Eye Contact:Fleeting; Minimal  Speech:Clear and Coherent  Speech Volume:Decreased  Handedness:No data recorded  Mood and Affect  Mood:Depressed; Dysphoric  Affect:Blunt   Thought Process  Thought Processes:Goal Directed  Descriptions of Associations:Tangential  Orientation:Full (Time, Place and Person)  Thought Content:Illogical; Tangential  History of Schizophrenia/Schizoaffective disorder:No  Duration of Psychotic Symptoms:No data recorded Hallucinations:Hallucinations: None  Ideas of Reference:None  Suicidal Thoughts:Suicidal Thoughts: Yes, Active SI Active Intent and/or Plan: With Intent  Homicidal Thoughts:Homicidal Thoughts: No   Sensorium  Memory:Immediate Fair; Recent Fair; Remote Fair  Judgment:Poor  Insight:Fair   Executive Functions  Concentration:Fair  Attention Span:Fair  [place]   Psychomotor Activity  Psychomotor Activity:Psychomotor Activity: Normal   Assets  Assets:Resilience; Social Support; Housing; Financial Resources/Insurance   Sleep  Sleep:Sleep: Fair    Physical Exam: Physical Exam Vitals and nursing note reviewed.  Constitutional:      General: She is not in acute distress. HENT:     Head: Normocephalic.     Nose: Nose normal.     Mouth/Throat:     Mouth: Mucous membranes are dry.  Eyes:     Comments: Unable to fully assess  Cardiovascular:     Rate and Rhythm: Normal rate.     Pulses: Normal pulses.  Pulmonary:     Effort: Pulmonary effort is normal.  Abdominal:     General: Abdomen is flat.  Musculoskeletal:     Cervical back: Normal range of  motion.  Neurological:     Mental Status: She is alert.  Psychiatric:        Attention and Perception: Perception normal.        Mood and Affect: Mood is depressed. Affect is blunt and flat.        Speech: Speech is tangential.        Behavior: Behavior is withdrawn.        Thought Content: Thought content includes suicidal ideation.        Cognition and Memory: Cognition and memory normal.        Judgment: Judgment is impulsive and inappropriate.    Review of Systems  Psychiatric/Behavioral:  Positive for depression and suicidal ideas.   All other systems reviewed and are negative.  Blood pressure 110/62, pulse 78, temperature 97.9 F (36.6 C), temperature source Oral, resp. rate 20, height 5' (1.524 m), weight 55.1 kg, SpO2 98 %. Body mass index is 23.72 kg/m.  Treatment Plan Summary: Daily contact with patient to assess and evaluate symptoms and  progress in treatment, Medication management, and Plan to continue to seek inpatient hospitalization   Disposition: Recommend psychiatric Inpatient admission when medically cleared. Supportive therapy provided about ongoing stressors. Discussed crisis plan, support from social network, calling 911, coming to the Emergency Department, and calling Suicide Hotline.  This service was provided via telemedicine using a 2-way, interactive audio and video technology.  Names of all persons participating in this telemedicine service and their role in this encounter. Name: Oneida Alar Role: PMHNP  Name: Hampton Abbot Role: Attending MD  Name: Shirl Harris [place] Role: patient  Name:  Role:     Inda Merlin, NP 11/26/2021 10:45 AM

## 2021-11-27 DIAGNOSIS — I4891 Unspecified atrial fibrillation: Secondary | ICD-10-CM | POA: Diagnosis not present

## 2021-11-27 DIAGNOSIS — T50902D Poisoning by unspecified drugs, medicaments and biological substances, intentional self-harm, subsequent encounter: Secondary | ICD-10-CM | POA: Diagnosis not present

## 2021-11-27 DIAGNOSIS — J9611 Chronic respiratory failure with hypoxia: Secondary | ICD-10-CM | POA: Diagnosis not present

## 2021-11-27 DIAGNOSIS — R7302 Impaired glucose tolerance (oral): Secondary | ICD-10-CM | POA: Diagnosis not present

## 2021-11-27 MED ORDER — HYDROCODONE-ACETAMINOPHEN 10-325 MG PO TABS
1.0000 | ORAL_TABLET | ORAL | Status: DC | PRN
  Administered 2021-11-27 – 2021-12-07 (×36): 1 via ORAL
  Filled 2021-11-27 (×37): qty 1

## 2021-11-27 NOTE — Consult Note (Addendum)
Telepsych Consultation   Reason for Consult:  suicide attempt Referring Physician:  Hayden Rasmussen MD Location of Patient:  [id number] Location of Provider: [place] Department  Patient Identification: Tina Bailey MRN:  [id number] Principal Diagnosis: Intentional overdose [facility]) Diagnosis:  Principal Problem:   Intentional overdose ([place]) Active Problems:   Hepatocellular carcinoma ([place])   Depression with anxiety   COPD (chronic obstructive pulmonary disease) (HCC)   Tobacco abuse   Severe episode of recurrent major depressive disorder, without psychotic features ([place])   Chronic respiratory failure with hypoxia and hypercapnia (HCC)   Atrial fibrillation with RVR ([place])   Opioid dependence ([place])   Essential hypertension   Impaired glucose tolerance   Total Time spent with patient: 20 minutes  Subjective:   [name] is a 76 y.o. female patient admitted with suicidal attempt.  "I'm not staying in this hospital and I'm not staying one more day. I want out. Send me home!"  Patient presents sitting on side of the bed with sitter at bedside. She is oriented to person, place, time, and situation. 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 Blunt, labile affect. Tangential speech. Inattentive what appears to be purposely. When attempting to discuss safety concerns, "I promise I won't harm myself this week. I'll give you through September then after September I'm back in business. It has to be that way". Patient continues to endorse ideations and intent to complete suicide. Expresses continued hopelessness and despair. Reports wanting to move back [place] with her son in [place] closer to her home in [place]. "All I am is a big fat burden. That's all. Give me Demerol. (For sleep)". She continues to complain about different physical ailments regarding pain and gas. States if kept in hospital she will begin to yell then proceeds to yell "help".   HPI:  HPI:   Tina Bailey is a 76 year old female with past psychiatric history of depression with anxiety, opioid dependence, severe episode of recurrent major depressive disorder without psychotic features who presented to [facility] after intentionally ingesting 24 Excedrin pills in a suicide attempt. Per chart review patient noted to c/o of being "in a lot of pain and wants to die"; vomitus noted in her trashcan. Patient currently lives at [facility]. On admission patient noted to have prolonged QTC, elevated K and Mg requiring cardiac monitoring, IV NAC, and supportive care. She was cleared by Poison Control and later by EDP. Of note pt observed sticking her finger down her throat in attempt to induce vomit. Per chart review pt presented to [facility] 07/04/21 with "suicidal behavior" where she was transferred to [facility] gero-unit for inpatient,  [facility] 07/25/21 for "suicidal ideation" where she was transferred to [facility] for gero-psychiatric admission. Acitve anti-depressants and anti-psychotropics. UDS+opiates, BAL<10. Last salicylate 40/97/35 32.9. PDMP reviewed, Clonazepam 0.5 mg (45 tabs) filled 10/20/21, Hydrocodone 10-'325mg'$  (120 tabs)  filled 10/27/21, Gabapentin 300 mg (3 tabs) filled 11/13/21.  Past Psychiatric History: depression with anxiety, opioid dependence, severe episode of recurrent major depressive disorder without psychotic features  Risk to Self:   Risk to Others:   Prior Inpatient Therapy:   Prior Outpatient Therapy:    Past Medical History:  Past Medical History:  Diagnosis Date   Arthritis    DDD. Right shoulder"is frozen"-limited ROM. osteoporosis.   Blue toe syndrome ([place]) 11/27/2014   Dr. Claudia Pollock evaluating   Chronic diastolic CHF (congestive heart failure) (HCC)    Chronic respiratory failure ([place])  Complication of anesthesia    COPD (chronic obstructive pulmonary disease) (HCC)    Fibromyalgia    Fracture of rib of right side    hx "osteoporosis"- states her dog nudge  her on the side, next day developed great pain and was told has a fracture rib.   Liver cancer ([place]) 12/2014   Macular degeneration    R eye   Osteoporosis    PONV (postoperative nausea and vomiting)    nausea,severe vomiting after 01-13-15 portal vein embolization   Productive cough    Retina disorder    L eye, vision distorted, edema   Right atrial mass    Spine fracture due to birth trauma    Thoracic aortic aneurysm (TAA) ([place])    TMJ disease    Wears glasses     Past Surgical History:  Procedure Laterality Date   ANGIOPLASTY  2008   no stents required, no follow-up with cardiologist, no recurrent chest pain   CATARACT EXTRACTION, BILATERAL Bilateral    ESOPHAGOGASTRODUODENOSCOPY (EGD) WITH PROPOFOL N/A 06/25/2015   Procedure: ESOPHAGOGASTRODUODENOSCOPY (EGD) WITH PROPOFOL;  Surgeon: Milus Banister, MD;  Location: [facility];  Service: Endoscopy;  Laterality: N/A;  stent removal    EYE SURGERY     cornea surgery   INTRAMEDULLARY (IM) NAIL INTERTROCHANTERIC Right 12/04/2017   Procedure: INTRAMEDULLARY (IM) NAIL INTERTROCHANTRIC;  Surgeon: Rod Can, MD;  Location: [facility];  Service: Orthopedics;  Laterality: Right;   IR RADIOLOGIST EVAL & MGMT  03/29/2017   KIDNEY STONE SURGERY     LAPAROSCOPIC PARTIAL HEPATECTOMY N/A 03/04/2015   Procedure: DIAGNOSTIC LAPAROSCOPY, EXTENDED RIGHT HEPATECTOMY, WITH INTRAOPERATIVE ULTRASOUND;  Surgeon: Stark Klein, MD;  Location: [facility];  Service: General;  Laterality: N/A;   PARTIAL HYSTERECTOMY     portal vein embolization     01-13-15 -Dr. Cathlean Sauer.   Family History:  Family History  Problem Relation Age of Onset   Hypertension Mother    Atrial fibrillation Mother    Heart disease Mother        after age 46   Breast cancer Mother    Stroke Father    Dementia Father    Peptic Ulcer Father    Heart disease Brother        After age 67- A-Fib   Heart attack Brother    Family Psychiatric  History: not noted Social  History:  Social History   Substance and Sexual Activity  Alcohol Use No   Alcohol/week: 0.0 standard drinks of alcohol     Social History   Substance and Sexual Activity  Drug Use No    Social History   Socioeconomic History   Marital status: Divorced    Spouse name: Not on file   Number of children: 3   Years of education: Not on file   Highest education level: Not on file  Occupational History   Occupation: retired  Tobacco Use   Smoking status: Former    Packs/day: 1.00    Years: 45.00    Total pack years: 45.00    Types: Cigarettes    Quit date: 02/20/2018    Years since quitting: 3.7   Smokeless tobacco: Never  Vaping Use   Vaping Use: Never used  Substance and Sexual Activity   Alcohol use: No    Alcohol/week: 0.0 standard drinks of alcohol   Drug use: No   Sexual activity: Not Currently  Other Topics Concern   Not on file  Social History Narrative  Not on file   Social Determinants of Health   Financial Resource Strain: Not on file  Food Insecurity: No Food Insecurity (11/24/2021)   Hunger Vital Sign    Worried About Running Out of Food in the Last Year: Never true    Ran Out of Food in the Last Year: Never true  Transportation Needs: No Transportation Needs (11/24/2021)   PRAPARE - Hydrologist (Medical): No    Lack of Transportation (Non-Medical): No  Physical Activity: Not on file  Stress: Not on file  Social Connections: Not on file   Additional Social History:    Allergies:   Allergies  Allergen Reactions   Zoloft [Sertraline Hcl] Other (See Comments)    Zoloft, had a terrible reaction   Amoxicillin-Pot Clavulanate Nausea Only    Amoxacillin Augmentin combo  Has patient had a PCN reaction causing immediate rash, facial/tongue/throat swelling, SOB or lightheadedness with hypotension: No Has patient had a PCN reaction causing severe rash involving mucus membranes or skin necrosis: No Has patient had a PCN  reaction that required hospitalization No Has patient had a PCN reaction occurring within the last 10 years: No If all of the above answers are "NO", then may proceed with Cephalosporin use.    Clindamycin/Lincomycin Other (See Comments)    Felt like it "burned out" her stomach/took a large dose   Paxil [Paroxetine Hcl] Rash   Sulfa Antibiotics Rash    Labs: No results found for this or any previous visit (from the past 48 hour(s)).  Medications:  Current Facility-Administered Medications  Medication Dose Route Frequency Provider Last Rate Last Admin   acetaminophen (TYLENOL) tablet 650 mg  650 mg Oral Q6H PRN Tat, [name], MD   650 mg at 11/26/21 1319   Or   acetaminophen (TYLENOL) suppository 650 mg  650 mg Rectal Q6H PRN Tat, [name], MD       Chlorhexidine Gluconate Cloth 2 % PADS 6 each  6 each Topical [id number] Orson Eva, MD   6 each at 11/25/21 1158   cholecalciferol (VITAMIN D3) 25 MCG (1000 UNIT) tablet 2,000 Units  2,000 Units Oral QPC lunch Hayden Rasmussen, MD   2,000 Units at 11/26/21 1319   enoxaparin (LOVENOX) injection 40 mg  40 mg Subcutaneous Q24H Tat, [name], MD   40 mg at 11/26/21 1319   FLUoxetine (PROZAC) capsule 20 mg  20 mg Oral Daily Hayden Rasmussen, MD   20 mg at 11/27/21 6712   gabapentin (NEURONTIN) capsule 300 mg  300 mg Oral QHS Hayden Rasmussen, MD   300 mg at 11/26/21 2104   HYDROcodone-acetaminophen (NORCO) 10-325 MG per tablet 1 tablet  1 tablet Oral Q6H PRN Tat, [name], MD   1 tablet at 11/27/21 0837   ipratropium-albuterol (DUONEB) 0.5-2.5 (3) MG/3ML nebulizer solution 3 mL  3 mL Nebulization Q6H PRN Tat, [name], MD       metoprolol tartrate (LOPRESSOR) tablet 12.5 mg  12.5 mg Oral BID Dunn, Dayna N, PA-C   12.5 mg at 11/27/21 0831   mirtazapine (REMERON) tablet 15 mg  15 mg Oral QHS Hayden Rasmussen, MD   15 mg at 11/26/21 2104   mometasone-formoterol (DULERA) 100-5 MCG/ACT inhaler 2 puff  2 puff Inhalation BID Hayden Rasmussen, MD   2 puff at 11/27/21 0801    multivitamin with minerals tablet 1 tablet  1 tablet Oral Daily Tat, [name], MD   1 tablet at 11/26/21 0910   ondansetron [facility])  tablet 4 mg  4 mg Oral Q6H PRN Tat, David, MD       Or   ondansetron (ZOFRAN) injection 4 mg  4 mg Intravenous Q6H PRN Tat, [name], MD       pantoprazole (PROTONIX) EC tablet 40 mg  40 mg Oral Daily Hayden Rasmussen, MD   40 mg at 11/27/21 0830   polyethylene glycol (MIRALAX / GLYCOLAX) packet 17 g  17 g Oral Daily Tat, [name], MD   17 g at 11/26/21 1206   risperiDONE (RISPERDAL) tablet 0.25 mg  0.25 mg Oral [id number] Hayden Rasmussen, MD   0.25 mg at 11/27/21 0830   senna (SENOKOT) tablet 17.2 mg  2 tablet Oral Daily Tat, [name], MD   17.2 mg at 11/27/21 0831   umeclidinium bromide (INCRUSE ELLIPTA) 62.5 MCG/ACT 1 puff  1 puff Inhalation Daily Hayden Rasmussen, MD   1 puff at 11/23/21 7619    Musculoskeletal: Strength & Muscle Tone: decreased Gait & Station: unsteady Patient leans: N/A  Psychiatric Specialty Exam:  Presentation  General Appearance: Casual  Eye Contact:Fleeting; Minimal  Speech:Clear and Coherent  Speech Volume:Decreased  Handedness:No data recorded  Mood and Affect  Mood:Depressed; Dysphoric  Affect:Blunt   Thought Process  Thought Processes:Goal Directed  Descriptions of Associations:Tangential  Orientation:Full (Time, Place and Person)  Thought Content:Illogical; Tangential  History of Schizophrenia/Schizoaffective disorder:No  Duration of Psychotic Symptoms:No data recorded Hallucinations:Hallucinations: None  Ideas of Reference:None  Suicidal Thoughts:Suicidal Thoughts: Yes, Active SI Active Intent and/or Plan: With Intent  Homicidal Thoughts:Homicidal Thoughts: No   Sensorium  Memory:Immediate Fair; Recent Fair; Remote Fair  Judgment:Poor  Insight:Fair   Executive Functions  Concentration:Fair  Attention Span:Fair  [place]   Psychomotor Activity   Psychomotor Activity:Psychomotor Activity: Normal   Assets  Assets:Resilience; Social Support; Housing; Financial Resources/Insurance   Sleep  Sleep:Sleep: Fair    Physical Exam: Physical Exam Vitals and nursing note reviewed.  Constitutional:      Appearance: She is not ill-appearing.  HENT:     Head: Normocephalic.     Nose: Nose normal.     Mouth/Throat:     Mouth: Mucous membranes are moist.     Pharynx: Oropharynx is clear.  Eyes:     Pupils: Pupils are equal, round, and reactive to light.  Cardiovascular:     Rate and Rhythm: Normal rate.     Pulses: Normal pulses.  Pulmonary:     Effort: Pulmonary effort is normal.  Abdominal:     Palpations: Abdomen is soft.  Musculoskeletal:        General: Normal range of motion.     Cervical back: Normal range of motion.  Skin:    General: Skin is warm and dry.  Neurological:     Mental Status: She is alert and oriented to person, place, and time.  Psychiatric:        Attention and Perception: She is inattentive.        Mood and Affect: Affect is blunt and inappropriate.        Speech: Speech is tangential.        Behavior: Behavior is withdrawn.        Thought Content: Thought content includes suicidal ideation. Thought content includes suicidal plan.        Cognition and Memory: Cognition and memory normal.        Judgment: Judgment is impulsive and inappropriate.    Review of Systems  Musculoskeletal:  Positive  for myalgias.  Psychiatric/Behavioral:  Positive for depression and suicidal ideas. The patient is nervous/anxious.   All other systems reviewed and are negative.  Blood pressure 117/85, pulse (!) 110, temperature 97.9 F (36.6 C), temperature source Oral, resp. rate 18, height 5' (1.524 m), weight 55.1 kg, SpO2 96 %. Body mass index is 23.72 kg/m.  Treatment Plan Summary: Daily contact with patient to assess and evaluate symptoms and progress in treatment, Medication management, and Plan continue to  seek gero-psychiatric placement.   Disposition: Recommend psychiatric Inpatient admission when medically cleared. Supportive therapy provided about ongoing stressors. Discussed crisis plan, support from social network, calling 911, coming to the Emergency Department, and calling Suicide Hotline.  This service was provided via telemedicine using a 2-way, interactive audio and video technology.  Names of all persons participating in this telemedicine service and their role in this encounter. Name: Oneida Alar Role: PMHNP  Name: Janine Limbo Role: Attending MD  Name: Tina Bailey  Role: patient  Name:  Role:     Inda Merlin, NP 11/27/2021 11:28 AM

## 2021-11-27 NOTE — Progress Notes (Signed)
PROGRESS NOTE  Tina Bailey ZOX:[id number] DOB: [birth year] DOA: 11/22/2021 PCP: Vilinda Boehringer, NP  Brief History:  76 year old female with a history of COPD, hepatocellular carcinoma, opioid dependence with chronic abdominal and chronic back pain, depression, tobacco abuse, hypertension, chronic respiratory failure on 2 L presenting with suicide attempt.  The patient presented to the ED on 11/22/2021 after taking 28 Excedrin on the evening of 11/21/2021.  She began having nausea and vomiting throughout the night from 11/21/2021 to 11/22/2021.  She notified her daughter on the morning of 11/22/2021 and she was brought to the emergency department for further evaluation.  [facility].  The patient was evaluated by psychiatry who recommended inpatient evaluation and treatment.  While waiting for an inpatient [facility] psychiatric bed, the patient developed atrial fibrillation with RVR on the morning of 11/24/2021 with heart rate in the 170s.  She was given a diltiazem bolus, 5 mg and then started on a drip.  Admission was requested. The patient denies any fever, chills, headache, chest pain, shortness breath, vomiting, diarrhea.  She has chronic nausea.  She has not had a bowel movement.  There is no hematochezia or melena.  She denies any coughing or hemoptysis. In the ED, the patient was afebrile hemodynamically stable albeit with soft blood pressures after starting diltiazem.  Oxygen saturation is 96% on 2 L.  WBC 7.1, hemoglobin 12.3, platelets 228,000.  BMP showed sodium 137, potassium 3.9, bicarbonate 27, serum creatinine 0.64.  EKG showed atrial fibrillation with nonspecific ST-T wave change.  Urine drug screen was positive for opiates.  Cardiology was consulted to assist with management.  Fortunately, patient spontaneously converted to sinus in late morning 11/24/21 and she remained in sinus thereafter.  Cardiology changed her regimen to metoprolol 25 mg bid and midodrine was  stopped.  Patient remained stable without new afib.  She is cleared medically to go to [facility] facility.     Assessment and Plan: Depression with anxiety Currently with suicidal ideation [facility] telepsychiatry to follow>>recommended d/c to inpatient [facility] facility Continue fluoxetine, mirtazapine Medically stable to go to [facility]  Impaired glucose tolerance 05/24/21 A1C--6.2 CBG controlled during hospitalization Outpatient PCP follow up  Essential hypertension Stopped amlodipine and spironolactone to allow BP margin to titrate metoprolol --BP controlled  Opioid dependence ([place]) PDMP reviewed --norco 10/325 #120 monthly  Atrial fibrillation with RVR ([place]) CHADSVASc = 5 Continue diltiazem drip>>stopped after patient spontaneously converted to sinus in late morning 11/24/21 Cardiology consulted>>initially placed on metoprolol tid>>bid Now back in sinus with PACs 11/24/21 Echo EF 60-65%, G1DD, mild TR/MR, RVSP 40.7 TSH--0.575 Pt remained in sinus and hemodynamically stable and was cleared for d/c by cardiology Patient refuses anticoagulation after explanation of risks/benefits/alternatives -9/8 metoprolol decreased to 12.5 mg bid due to soft BP --episode may have been triggered by patient's excess caffeine intake and from Excedrin  Chronic respiratory failure with hypoxia and hypercapnia ([place]) Chronically on 2L stable  Tobacco abuse Tobacco cessation discussed  COPD (chronic obstructive pulmonary disease) ([place]) Continue Breztri  Hepatocellular carcinoma ([place]) Currently under care of Dr. Burr Medico at [facility] --over 5 yrs from diagnosis --sp partical hepatectomy --under observation, not on active tx  Family Communication:  no Family at bedside   Consultants:  telepsychiatry, cardiology   Code Status:  DNR   DVT Prophylaxis:  [place] Lovenox     Procedures: As Listed in Progress Note Above   Antibiotics: None  Subjective:  Patient denies fevers, chills,  headache, chest pain, dyspnea, nausea, vomiting, diarrhea,   Objective: Vitals:   11/27/21 0338 11/27/21 0801 11/27/21 0831 11/27/21 1228  BP: (!) 116/45  117/85 (!) 102/46  Pulse: (!) 110   64  Resp: 18   17  Temp: 97.9 F (36.6 C)   98.3 F (36.8 C)  TempSrc: Oral   Oral  SpO2: 100% 96%  99%  Weight:      Height:        Intake/Output Summary (Last 24 hours) at 11/27/2021 1245 Last data filed at 11/27/2021 1202 Gross per 24 hour  Intake 880 ml  Output 500 ml  Net 380 ml   Weight change:  Exam:  General:  Pt is alert, follows commands appropriately, not in acute distress HEENT: No icterus, No thrush, No neck mass, [place]/AT Cardiovascular: RRR, S1/S2, no rubs, no gallops Respiratory: CTA bilaterally, no wheezing, no crackles, no rhonchi Abdomen: Soft/+BS, non tender, non distended, no guarding Extremities: No edema, No lymphangitis, No petechiae, No rashes, no synovitis   Data Reviewed: I have personally reviewed following labs and imaging studies Basic Metabolic Panel: Recent Labs  Lab 11/22/21 1600 11/24/21 0721 11/25/21 0532  NA 135 137 137  K 3.6 3.9 4.2  CL 98 101 102  CO2 '27 27 30  '$ GLUCOSE 95 105* 89  BUN '14 18 21  '$ CREATININE 0.59 0.64 0.68  CALCIUM 8.8* 8.8* 8.7*  MG 2.1 2.1 2.0   Liver Function Tests: Recent Labs  Lab 11/22/21 1600 11/22/21 1923  AST 18 19  ALT 11 13  ALKPHOS 67 68  BILITOT 0.9 0.8  PROT 6.9 7.1  ALBUMIN 3.8 3.9   No results for input(s): "LIPASE", "AMYLASE" in the last 168 hours. No results for input(s): "AMMONIA" in the last 168 hours. Coagulation Profile: Recent Labs  Lab 11/24/21 0721  INR 1.1   CBC: Recent Labs  Lab 11/22/21 1600 11/24/21 0721  WBC 8.9 7.1  NEUTROABS 7.5  --   HGB 11.1* 12.3  HCT 34.5* 39.6  MCV 94.3 97.3  PLT 230 228   Cardiac Enzymes: No results for input(s): "CKTOTAL", "CKMB", "CKMBINDEX", "TROPONINI" in the last 168 hours. BNP: Invalid input(s): "POCBNP" CBG: No results for input(s):  "GLUCAP" in the last 168 hours. HbA1C: No results for input(s): "HGBA1C" in the last 72 hours. Urine analysis:    Component Value Date/Time   COLORURINE YELLOW 07/02/2021 1523   APPEARANCEUR HAZY (A) 07/02/2021 1523   LABSPEC 1.016 07/02/2021 1523   PHURINE 7.0 07/02/2021 1523   GLUCOSEU NEGATIVE 07/02/2021 1523   HGBUR NEGATIVE 07/02/2021 1523   BILIRUBINUR NEGATIVE 07/02/2021 1523   BILIRUBINUR negative 05/07/2021 1618   BILIRUBINUR negative 05/15/2018 0859   KETONESUR NEGATIVE 07/02/2021 1523   PROTEINUR NEGATIVE 07/02/2021 1523   UROBILINOGEN 0.2 05/07/2021 1618   NITRITE NEGATIVE 07/02/2021 1523   LEUKOCYTESUR NEGATIVE 07/02/2021 1523   Sepsis Labs: '@LABRCNTIP'$ (procalcitonin:4,lacticidven:4) ) Recent Results (from the past 240 hour(s))  SARS Coronavirus 2 by RT PCR (hospital order, performed in [place] hospital lab) *cepheid single result test* Anterior Nasal Swab     Status: None   Collection Time: 11/22/21  4:26 PM   Specimen: Anterior Nasal Swab  Result Value Ref Range Status   SARS Coronavirus 2 by RT PCR NEGATIVE NEGATIVE Final    Comment: (NOTE) SARS-CoV-2 target nucleic acids are NOT DETECTED.  The SARS-CoV-2 RNA is generally detectable in upper and lower respiratory specimens during the acute phase of infection.  The lowest concentration of SARS-CoV-2 viral copies this assay can detect is 250 copies / mL. A negative result does not preclude SARS-CoV-2 infection and should not be used as the sole basis for treatment or other patient management decisions.  A negative result may occur with improper specimen collection / handling, submission of specimen other than nasopharyngeal swab, presence of viral mutation(s) within the areas targeted by this assay, and inadequate number of viral copies (<250 copies / mL). A negative result must be combined with clinical observations, patient history, and epidemiological information.  Fact Sheet for Patients:    https://www.patel.info/  Fact Sheet for Healthcare Providers: https://hall.com/  This test is not yet approved or  cleared by the [place] FDA and has been authorized for detection and/or diagnosis of SARS-CoV-2 by FDA under an Emergency Use Authorization (EUA).  This EUA will remain in effect (meaning this test can be used) for the duration of the COVID-19 declaration under Section 564(b)(1) of the Act, 21 U.S.C. section 360bbb-3(b)(1), unless the authorization is terminated or revoked sooner.  Performed at [facility], [address] [place], [place] [address]   MRSA Next Gen by PCR, Nasal     Status: None   Collection Time: 11/24/21 11:55 AM   Specimen: Nasal Mucosa; Nasal Swab  Result Value Ref Range Status   MRSA by PCR Next Gen NOT DETECTED NOT DETECTED Final    Comment: (NOTE) The GeneXpert MRSA Assay (FDA approved for NASAL specimens only), is one component of a comprehensive MRSA colonization surveillance program. It is not intended to diagnose MRSA infection nor to guide or monitor treatment for MRSA infections. Test performance is not FDA approved in patients less than 43 years old. Performed at [facility], [address] [place], [place] [address]      Scheduled Meds:  Chlorhexidine Gluconate Cloth  6 each Topical Q0600   cholecalciferol  2,000 Units Oral QPC lunch   enoxaparin (LOVENOX) injection  40 mg Subcutaneous Q24H   FLUoxetine  20 mg Oral Daily   gabapentin  300 mg Oral QHS   metoprolol tartrate  12.5 mg Oral BID   mirtazapine  15 mg Oral QHS   mometasone-formoterol  2 puff Inhalation BID   multivitamin with minerals  1 tablet Oral Daily   pantoprazole  40 mg Oral Daily   polyethylene glycol  17 g Oral Daily   risperiDONE  0.25 mg Oral [id number]   senna  2 tablet Oral Daily   umeclidinium bromide  1 puff Inhalation Daily   Continuous Infusions:  Procedures/Studies: ECHOCARDIOGRAM COMPLETE  Result  Date: 11/24/2021    ECHOCARDIOGRAM REPORT   Patient Name:   Tina Bailey Date of Exam: 11/24/2021 Medical Rec #:  [id number]         Height:       60.0 in Accession #:    [id number]        Weight:       121.5 lb Date of Birth:  05-15-1945        BSA:          1.510 m Patient Age:    77 years          BP:           109/42 mmHg Patient Gender: F                 HR:           67 bpm. Exam Location:  [facility] Procedure: 2D  Echo, Cardiac Doppler and Color Doppler Indications:    Atrial Fibrillation  History:        Patient has prior history of Echocardiogram examinations, most                 recent 03/02/2018. CHF, Pulmonary HTN and COPD,                 Arrythmias:Atrial Fibrillation; Risk Factors:Former Smoker.  Sonographer:    Wenda Low Referring Phys: [phone] Tressie Ragin IMPRESSIONS  1. Left ventricular ejection fraction, by estimation, is 60 to 65%. The left ventricle has normal function. The left ventricle has no regional wall motion abnormalities. There is mild left ventricular hypertrophy. Left ventricular diastolic parameters are consistent with Grade I diastolic dysfunction (impaired relaxation).  2. Right ventricular systolic function is normal. The right ventricular size is normal. There is mildly elevated pulmonary artery systolic pressure.  3. Linear density in right atrium poorly visualized, unclear etiology.  4. The mitral valve is abnormal. Mild mitral valve regurgitation. No evidence of mitral stenosis.  5. The tricuspid valve is abnormal.  6. The aortic valve is tricuspid. There is mild calcification of the aortic valve. There is mild thickening of the aortic valve. Aortic valve regurgitation is not visualized. No aortic stenosis is present. FINDINGS  Left Ventricle: Left ventricular ejection fraction, by estimation, is 60 to 65%. The left ventricle has normal function. The left ventricle has no regional wall motion abnormalities. The left ventricular internal cavity size was normal in size.  There is  mild left ventricular hypertrophy. Left ventricular diastolic parameters are consistent with Grade I diastolic dysfunction (impaired relaxation). Normal left ventricular filling pressure. Right Ventricle: The right ventricular size is normal. Right vetricular wall thickness was not well visualized. Right ventricular systolic function is normal. There is mildly elevated pulmonary artery systolic pressure. The tricuspid regurgitant velocity  is 3.07 m/s, and with an assumed right atrial pressure of 3 mmHg, the estimated right ventricular systolic pressure is 79.8 mmHg. Left Atrium: Left atrial size was normal in size. Right Atrium: Linear density in right atrium poorly visualized, unclear etiology. Right atrial size was normal in size. Pericardium: There is no evidence of pericardial effusion. Mitral Valve: The mitral valve is abnormal. Mild mitral valve regurgitation. No evidence of mitral valve stenosis. MV peak gradient, 4.2 mmHg. The mean mitral valve gradient is 2.0 mmHg. Tricuspid Valve: The tricuspid valve is abnormal. Tricuspid valve regurgitation is mild . No evidence of tricuspid stenosis. Aortic Valve: The aortic valve is tricuspid. There is mild calcification of the aortic valve. There is mild thickening of the aortic valve. There is mild aortic valve annular calcification. Aortic valve regurgitation is not visualized. No aortic stenosis  is present. Aortic valve mean gradient measures 3.5 mmHg. Aortic valve peak gradient measures 8.1 mmHg. Aortic valve area, by VTI measures 2.57 cm. Pulmonic Valve: The pulmonic valve was not well visualized. Pulmonic valve regurgitation is not visualized. No evidence of pulmonic stenosis. Aorta: The aortic root is normal in size and structure. Venous: Small IVC suggesting low RA pressure and hypovolemia. IAS/Shunts: No atrial level shunt detected by color flow Doppler.  LEFT VENTRICLE PLAX 2D LVIDd:         4.30 cm   Diastology LVIDs:         2.90 cm   LV e'  medial:    6.85 cm/s LV PW:         0.90 cm   LV E/e' medial:  13.2  LV IVS:        1.20 cm   LV e' lateral:   8.92 cm/s LVOT diam:     2.00 cm   LV E/e' lateral: 10.1 LV SV:         76 LV SV Index:   50 LVOT Area:     3.14 cm  RIGHT VENTRICLE RV Basal diam:  3.35 cm RV Mid diam:    2.10 cm RV S prime:     11.10 cm/s TAPSE (M-mode): 1.8 cm LEFT ATRIUM             Index        RIGHT ATRIUM           Index LA diam:        3.40 cm 2.25 cm/m   RA Area:     13.10 cm LA Vol (A2C):   50.2 ml 33.24 ml/m  RA Volume:   28.50 ml  18.87 ml/m LA Vol (A4C):   35.7 ml 23.64 ml/m LA Biplane Vol: 44.6 ml 29.53 ml/m  AORTIC VALVE                    PULMONIC VALVE AV Area (Vmax):    2.30 cm     PV Vmax:       0.83 m/s AV Area (Vmean):   2.29 cm     PV Peak grad:  2.8 mmHg AV Area (VTI):     2.57 cm AV Vmax:           142.00 cm/s AV Vmean:          86.250 cm/s AV VTI:            0.295 m AV Peak Grad:      8.1 mmHg AV Mean Grad:      3.5 mmHg LVOT Vmax:         104.00 cm/s LVOT Vmean:        63.000 cm/s LVOT VTI:          0.241 m LVOT/AV VTI ratio: 0.82  AORTA Ao Root diam: 3.50 cm MITRAL VALVE               TRICUSPID VALVE MV Area (PHT): 4.29 cm    TR Peak grad:   37.7 mmHg MV Area VTI:   2.22 cm    TR Vmax:        307.00 cm/s MV Peak grad:  4.2 mmHg MV Mean grad:  2.0 mmHg    SHUNTS MV Vmax:       1.03 m/s    Systemic VTI:  0.24 m MV Vmean:      59.2 cm/s   Systemic Diam: 2.00 cm MV Decel Time: 177 msec MV E velocity: 90.40 cm/s MV A velocity: 93.80 cm/s MV E/A ratio:  0.96 Carlyle Dolly MD Electronically signed by Carlyle Dolly MD Signature Date/Time: 11/24/2021/4:11:35 PM    Final     Orson Eva, DO  [facility]  If 7PM-7AM, please contact night-coverage [email or web address] Password [email or web address] 11/27/2021, 12:45 PM   LOS: 3 days

## 2021-11-28 ENCOUNTER — Inpatient Hospital Stay (HOSPITAL_COMMUNITY): Payer: Medicare HMO

## 2021-11-28 DIAGNOSIS — R300 Dysuria: Secondary | ICD-10-CM

## 2021-11-28 DIAGNOSIS — R7302 Impaired glucose tolerance (oral): Secondary | ICD-10-CM | POA: Diagnosis not present

## 2021-11-28 DIAGNOSIS — T50902D Poisoning by unspecified drugs, medicaments and biological substances, intentional self-harm, subsequent encounter: Secondary | ICD-10-CM | POA: Diagnosis not present

## 2021-11-28 DIAGNOSIS — S32000A Wedge compression fracture of unspecified lumbar vertebra, initial encounter for closed fracture: Secondary | ICD-10-CM

## 2021-11-28 DIAGNOSIS — Z720 Tobacco use: Secondary | ICD-10-CM

## 2021-11-28 DIAGNOSIS — J9611 Chronic respiratory failure with hypoxia: Secondary | ICD-10-CM | POA: Diagnosis not present

## 2021-11-28 DIAGNOSIS — R45851 Suicidal ideations: Secondary | ICD-10-CM

## 2021-11-28 DIAGNOSIS — C220 Liver cell carcinoma: Secondary | ICD-10-CM | POA: Diagnosis not present

## 2021-11-28 LAB — URINALYSIS, COMPLETE (UACMP) WITH MICROSCOPIC
Bilirubin Urine: NEGATIVE
Glucose, UA: NEGATIVE mg/dL
Hgb urine dipstick: NEGATIVE
Ketones, ur: 5 mg/dL — AB
Leukocytes,Ua: NEGATIVE
Nitrite: NEGATIVE
Protein, ur: NEGATIVE mg/dL
Specific Gravity, Urine: 1.01 (ref 1.005–1.030)
pH: 6 (ref 5.0–8.0)

## 2021-11-28 MED ORDER — SIMETHICONE 80 MG PO CHEW
80.0000 mg | CHEWABLE_TABLET | Freq: Four times a day (QID) | ORAL | Status: DC
  Administered 2021-11-28 – 2021-12-04 (×12): 80 mg via ORAL
  Filled 2021-11-28 (×19): qty 1

## 2021-11-28 MED ORDER — SODIUM CHLORIDE 0.9 % IV BOLUS
500.0000 mL | Freq: Once | INTRAVENOUS | Status: AC
  Administered 2021-11-28: 500 mL via INTRAVENOUS

## 2021-11-28 NOTE — Assessment & Plan Note (Signed)
CT renal--Bilateral nephrolithiasis. No signs of hydronephrosis, hydroureter or ureteral lithiasis UA--no pyuria

## 2021-11-28 NOTE — Assessment & Plan Note (Addendum)
CT abd--L5 compression deformity is new since 12/11/2018 loss of approximately 25% of the central vertebral body height. >>TLSO brace patient is up and performing physical activity.

## 2021-11-28 NOTE — Progress Notes (Signed)
Called and started order for patient TLSO brace. [phone]. Waiting for return call.

## 2021-11-28 NOTE — Progress Notes (Addendum)
PROGRESS NOTE  Tina Bailey EQA:[id number] DOB: Jan 13, 1946 DOA: 11/22/2021 PCP: Vilinda Boehringer, NP  Brief History:  76 year old female with a history of COPD, hepatocellular carcinoma, opioid dependence with chronic abdominal and chronic back pain, depression, tobacco abuse, hypertension, chronic respiratory failure on 2 L presenting with suicide attempt.  The patient presented to the ED on 11/22/2021 after taking 28 Excedrin on the evening of 11/21/2021.  She began having nausea and vomiting throughout the night from 11/21/2021 to 11/22/2021.  She notified her daughter on the morning of 11/22/2021 and she was brought to the emergency department for further evaluation.  [facility]  The patient was evaluated by psychiatry who recommended inpatient evaluation and treatment.  While waiting for an inpatient Geri psychiatric bed, the patient developed atrial fibrillation with RVR on the morning of 11/24/2021 with heart rate in the 170s.  She was given a diltiazem bolus, 5 mg and then started on a drip.  Admission was requested. The patient denies any fever, chills, headache, chest pain, shortness breath, vomiting, diarrhea.  She has chronic nausea.  She has not had a bowel movement.  There is no hematochezia or melena.  She denies any coughing or hemoptysis. In the ED, the patient was afebrile hemodynamically stable albeit with soft blood pressures after starting diltiazem.  Oxygen saturation is 96% on 2 L.  WBC 7.1, hemoglobin 12.3, platelets 228,000.  BMP showed sodium 137, potassium 3.9, bicarbonate 27, serum creatinine 0.64.  EKG showed atrial fibrillation with nonspecific ST-T wave change.  Urine drug screen was positive for opiates.  Cardiology was consulted to assist with management.  Fortunately, patient spontaneously converted to sinus in late morning 11/24/21 and she remained in sinus thereafter.  Cardiology changed her regimen to metoprolol 25 mg bid and midodrine was  stopped.  Patient remained stable without new afib.  She is cleared medically to go to [facility] facility.     Assessment and Plan: Depression with anxiety Currently with suicidal ideation [place] telepsychiatry to follow>>recommended d/c to inpatient [facility] facility Continue fluoxetine, mirtazapine Medically stable to go to [facility]  Lumbar compression fracture (HCC) CT abd--L5 compression deformity is new since 12/11/2018 loss of approximately 25% of the central vertebral body height. >>TLSO brace  Dysuria CT renal--Bilateral nephrolithiasis. No signs of hydronephrosis, hydroureter or ureteral lithiasis UA--no pyuria  Impaired glucose tolerance 05/24/21 A1C--6.2 CBG controlled during hospitalization Outpatient PCP follow up  Essential hypertension Stopped amlodipine and spironolactone to allow BP margin to titrate metoprolol --BP controlled  Opioid dependence ([place]) PDMP reviewed --norco 10/325 #120 monthly  Atrial fibrillation with RVR ([place]) CHADSVASc = 5 Continue diltiazem drip>>stopped after patient spontaneously converted to sinus in late morning 11/24/21 Cardiology consulted>>initially placed on metoprolol tid>>bid Now back in sinus with PACs 11/24/21 Echo EF 60-65%, G1DD, mild TR/MR, RVSP 40.7 TSH--0.575 Pt remained in sinus and hemodynamically stable and was cleared for d/c by cardiology Patient refuses anticoagulation after explanation of risks/benefits/alternatives -9/8 metoprolol decreased to 12.5 mg bid due to soft BP --episode may have been triggered by patient's excess caffeine intake and from Excedrin  Chronic respiratory failure with hypoxia and hypercapnia ([place]) Chronically on 2L stable  Tobacco abuse Tobacco cessation discussed  COPD (chronic obstructive pulmonary disease) ([place]) Continue Breztri  Hepatocellular carcinoma ([place]) Currently under care of Dr. Burr Medico at [facility] --over 5 yrs from diagnosis --sp partical hepatectomy --under  observation, not on active tx  Family Communication:  daughter updated 9/10   Consultants:  telepsychiatry, cardiology   Code Status:  DNR   DVT Prophylaxis:  [place] Lovenox     Procedures: As Listed in Progress Note Above   Antibiotics: None         Subjective: She complains of pain all over.  Denies f/c, cp, sob, n/v/d  Objective: Vitals:   11/28/21 0604 11/28/21 0632 11/28/21 0718 11/28/21 1620  BP: (!) 112/54 (!) 146/72  (!) 90/39  Pulse: (!) 59 64  (!) 59  Resp: 14   18  Temp: 98.4 F (36.9 C)   98.2 F (36.8 C)  TempSrc: Oral   Oral  SpO2: 100%  97% 98%  Weight: 57 kg     Height:        Intake/Output Summary (Last 24 hours) at 11/28/2021 1756 Last data filed at 11/28/2021 1700 Gross per 24 hour  Intake 540 ml  Output 680 ml  Net -140 ml   Weight change:  Exam:  General:  Pt is alert, follows commands appropriately, not in acute distress HEENT: No icterus, No thrush, No neck mass, [place]/AT Cardiovascular: RRR, S1/S2, no rubs, no gallops Respiratory: CTA bilaterally, no wheezing, no crackles, no rhonchi Abdomen: Soft/+BS, non tender, non distended, no guarding Extremities: No edema, No lymphangitis, No petechiae, No rashes, no synovitis   Data Reviewed: I have personally reviewed following labs and imaging studies Basic Metabolic Panel: Recent Labs  Lab 11/22/21 1600 11/24/21 0721 11/25/21 0532  NA 135 137 137  K 3.6 3.9 4.2  CL 98 101 102  CO2 '27 27 30  '$ GLUCOSE 95 105* 89  BUN '14 18 21  '$ CREATININE 0.59 0.64 0.68  CALCIUM 8.8* 8.8* 8.7*  MG 2.1 2.1 2.0   Liver Function Tests: Recent Labs  Lab 11/22/21 1600 11/22/21 1923  AST 18 19  ALT 11 13  ALKPHOS 67 68  BILITOT 0.9 0.8  PROT 6.9 7.1  ALBUMIN 3.8 3.9   No results for input(s): "LIPASE", "AMYLASE" in the last 168 hours. No results for input(s): "AMMONIA" in the last 168 hours. Coagulation Profile: Recent Labs  Lab 11/24/21 0721  INR 1.1   CBC: Recent Labs  Lab  11/22/21 1600 11/24/21 0721  WBC 8.9 7.1  NEUTROABS 7.5  --   HGB 11.1* 12.3  HCT 34.5* 39.6  MCV 94.3 97.3  PLT 230 228   Cardiac Enzymes: No results for input(s): "CKTOTAL", "CKMB", "CKMBINDEX", "TROPONINI" in the last 168 hours. BNP: Invalid input(s): "POCBNP" CBG: No results for input(s): "GLUCAP" in the last 168 hours. HbA1C: No results for input(s): "HGBA1C" in the last 72 hours. Urine analysis:    Component Value Date/Time   COLORURINE YELLOW 11/28/2021 [place] 11/28/2021 1119   LABSPEC 1.010 11/28/2021 1119   PHURINE 6.0 11/28/2021 1119   GLUCOSEU NEGATIVE 11/28/2021 1119   HGBUR NEGATIVE 11/28/2021 1119   BILIRUBINUR NEGATIVE 11/28/2021 1119   BILIRUBINUR negative 05/07/2021 1618   BILIRUBINUR negative 05/15/2018 0859   KETONESUR 5 (A) 11/28/2021 1119   PROTEINUR NEGATIVE 11/28/2021 1119   UROBILINOGEN 0.2 05/07/2021 1618   NITRITE NEGATIVE 11/28/2021 1119   LEUKOCYTESUR NEGATIVE 11/28/2021 1119   Sepsis Labs: '@LABRCNTIP'$ (procalcitonin:4,lacticidven:4) ) Recent Results (from the past 240 hour(s))  SARS Coronavirus 2 by RT PCR (hospital order, performed in [facility] hospital lab) *cepheid single result test* Anterior Nasal Swab     Status: None   Collection Time: 11/22/21  4:26 PM   Specimen: Anterior Nasal  Swab  Result Value Ref Range Status   SARS Coronavirus 2 by RT PCR NEGATIVE NEGATIVE Final    Comment: (NOTE) SARS-CoV-2 target nucleic acids are NOT DETECTED.  The SARS-CoV-2 RNA is generally detectable in upper and lower respiratory specimens during the acute phase of infection. The lowest concentration of SARS-CoV-2 viral copies this assay can detect is 250 copies / mL. A negative result does not preclude SARS-CoV-2 infection and should not be used as the sole basis for treatment or other patient management decisions.  A negative result may occur with improper specimen collection / handling, submission of specimen other than  nasopharyngeal swab, presence of viral mutation(s) within the areas targeted by this assay, and inadequate number of viral copies (<250 copies / mL). A negative result must be combined with clinical observations, patient history, and epidemiological information.  Fact Sheet for Patients:   https://www.patel.info/  Fact Sheet for Healthcare Providers: https://hall.com/  This test is not yet approved or  cleared by the [place] FDA and has been authorized for detection and/or diagnosis of SARS-CoV-2 by FDA under an Emergency Use Authorization (EUA).  This EUA will remain in effect (meaning this test can be used) for the duration of the COVID-19 declaration under Section 564(b)(1) of the Act, 21 U.S.C. section 360bbb-3(b)(1), unless the authorization is terminated or revoked sooner.  Performed at [facility], [address], [place], [place] [address]   MRSA Next Gen by PCR, Nasal     Status: None   Collection Time: 11/24/21 11:55 AM   Specimen: Nasal Mucosa; Nasal Swab  Result Value Ref Range Status   MRSA by PCR Next Gen NOT DETECTED NOT DETECTED Final    Comment: (NOTE) The GeneXpert MRSA Assay (FDA approved for NASAL specimens only), is one component of a comprehensive MRSA colonization surveillance program. It is not intended to diagnose MRSA infection nor to guide or monitor treatment for MRSA infections. Test performance is not FDA approved in patients less than 54 years old. Performed at [facility], [address], [place],  [address]      Scheduled Meds:  Chlorhexidine Gluconate Cloth  6 each Topical Q0600   cholecalciferol  2,000 Units Oral QPC lunch   enoxaparin (LOVENOX) injection  40 mg Subcutaneous Q24H   FLUoxetine  20 mg Oral Daily   gabapentin  300 mg Oral QHS   metoprolol tartrate  12.5 mg Oral BID   mirtazapine  15 mg Oral QHS   mometasone-formoterol  2 puff Inhalation BID   multivitamin with minerals   1 tablet Oral Daily   pantoprazole  40 mg Oral Daily   polyethylene glycol  17 g Oral Daily   risperiDONE  0.25 mg Oral [id number]   senna  2 tablet Oral Daily   simethicone  80 mg Oral QID   umeclidinium bromide  1 puff Inhalation Daily   Continuous Infusions:  Procedures/Studies:  Orson Eva, DO  [facility]  If 7PM-7AM, please contact night-coverage [email or web address] Password [email or web address] 11/28/2021, 5:56 PM   LOS: 4 days

## 2021-11-28 NOTE — Progress Notes (Signed)
Per Suzzanne Cloud, patient meets criteria for inpatient treatment. There are no available beds at [facility] today. CSW faxed referrals to the following facilities for review:  [address] [place] [address] [phone] --  [place], [place] [address] [phone] --  [place], [place] [address] [phone] --  [facility]  Pending - Request Sent N/A [address] [place] [address] [phone] --  [address] [place] [address] [phone] --  [facility]  Pending - Request Sent N/A [address] [place] [address] [phone] --  [facility]  Pending - Request Sent N/A [address] [place] [address] [phone] --  [facility]  Pending - Request Sent N/A [address], [place] [address] [phone] --  [place] Sent N/A [address], [place] [address] [phone] --  [place]  Pending - Request Sent N/A [place], [place] [address] [phone] --  [facility]  Pending - Request Sent N/A [address], [place] [address] [phone] --  [facility]  Pending - Request Sent N/A [address] [place] [address] [phone] --  [facility]  Pending - Request Sent N/A [address] [place] [address] [phone] --  [place] [place] [address] [phone] --  [place] [address], [place] [address] [phone] --  [facility]  Pending - Request Sent N/A [address] [place] [address] [phone] --  [facility]  Pending - Request Sent N/A [address] [place], [place] [address] [phone] --  [facility]  Pending - Request Sent N/A [address] [place] [address] [place] N/A [place], [place] [address] [phone] --  [facility]  Pending - Request Sent N/A [address], [place] [address] [phone] --   TTS will continue to seek bed placement.  Glennie Isle, MSW, [place] Phone: [phone] Disposition/TOC

## 2021-11-28 NOTE — Progress Notes (Signed)
[facility] advised that this patient will be further reviewed for possible placement.   Glennie Isle, MSW, [place] Phone: [phone] Disposition/TOC

## 2021-11-29 DIAGNOSIS — J9611 Chronic respiratory failure with hypoxia: Secondary | ICD-10-CM | POA: Diagnosis not present

## 2021-11-29 DIAGNOSIS — T50902D Poisoning by unspecified drugs, medicaments and biological substances, intentional self-harm, subsequent encounter: Secondary | ICD-10-CM | POA: Diagnosis not present

## 2021-11-29 DIAGNOSIS — F418 Other specified anxiety disorders: Secondary | ICD-10-CM | POA: Diagnosis not present

## 2021-11-29 DIAGNOSIS — I4891 Unspecified atrial fibrillation: Secondary | ICD-10-CM | POA: Diagnosis not present

## 2021-11-29 MED ORDER — KETOROLAC TROMETHAMINE 15 MG/ML IJ SOLN
15.0000 mg | Freq: Once | INTRAMUSCULAR | Status: AC
  Administered 2021-11-29: 15 mg via INTRAVENOUS
  Filled 2021-11-29: qty 1

## 2021-11-29 MED ORDER — LORAZEPAM 2 MG/ML IJ SOLN: 1.0000 mg | Freq: Once | INTRAMUSCULAR | Status: AC

## 2021-11-29 MED ORDER — LORAZEPAM 2 MG/ML IJ SOLN
INTRAMUSCULAR | Status: AC
  Administered 2021-11-29: 1 mg via INTRAVENOUS
  Filled 2021-11-29: qty 1

## 2021-11-29 NOTE — Progress Notes (Addendum)
   11/28/21 2119  Vitals  BP (!) 84/42 (MD Adefeso made aware.)  BP Location Left Arm  BP Method Manual  Patient Position (if appropriate) Lying  Pulse Rate Source Other (Comment) (manuel)  MEWS COLOR  MEWS Score Color Green  MEWS Score  MEWS Temp 0  MEWS Systolic 1  MEWS Pulse 0  MEWS RR 0  MEWS LOC 0  MEWS Score 1     Blood pressure low. Patient alert and oriented. No complaints of dizziness. MD Adefeso notified. Received order for a 500 mL Normal Saline Bolus.

## 2021-11-29 NOTE — Care Management Important Message (Signed)
Important Message  Patient Details  Name: Tina Bailey MRN: [id number] Date of Birth: [birth year]   Medicare Important Message Given:  Yes (spoke with daughter [name] at [phone], no additional copy needed at this time)     Tommy Medal 11/29/2021, 12:42 PM

## 2021-11-29 NOTE — Care Management Important Message (Deleted)
Important Message  Patient Details  Name: Tina Bailey MRN: [id number] Date of Birth: Feb 27, 1946   Medicare Important Message Given:  Yes     Tommy Medal 11/29/2021, 12:27 PM

## 2021-11-29 NOTE — Progress Notes (Signed)
Inpatient Behavioral Health Placement  Pt meets inpatient criteria Referral Brooke Leevy-Johnson,NP. Referral was sent to the following facilities;  Destination Service Provider Address Phone Fax  [facility]  [address] [place] [address] [phone]  [place]  [address] [place] [address] [phone] [facility]  [place], [place] [address] [phone]  [facility]  [place], [place] [address] [phone]  [facility]  [address] [place] [address] [place]  [facility]  [address], [place] [address] [phone]  [address] [place] [address] [phone]  [facility]  [address], [place] [address] [phone] [place]  [address] [place] [address] [phone] [place]  [address] [place] [address] [phone]  [facility]  [place], [place] [address] [phone]  [place]., [place] [address] [phone]  [facility] [place]  [place] [address] [phone]  [place], [place] [address] [phone] [facility]  [place], [place] [address] [phone] [facility]  [address] [place] [address] [phone] [facility]  [place], [place]  [address] [phone]  [facility]  [address] [place] [address] [phone]  [facility]  [address], [place] [address] [phone] [place] [address], [place] [address] [phone] [place]  [address] [place] [address] [phone] [facility]  [address] [place] [address] [phone]     Situation ongoing,  CSW will follow up.   Benjaman Kindler, MSW, LCSWA 11/29/2021  @ 1:08 PM

## 2021-11-29 NOTE — Progress Notes (Addendum)
PROGRESS NOTE  TINEY ZIPPER YBO:[id number] DOB: 05-Jul-1945 DOA: 11/22/2021 PCP: Vilinda Boehringer, NP  Brief History:  76 year old female with a history of COPD, hepatocellular carcinoma, opioid dependence with chronic abdominal and chronic back pain, depression, tobacco abuse, hypertension, chronic respiratory failure on 2 L presenting with suicide attempt.  The patient presented to the ED on 11/22/2021 after taking 28 Excedrin on the evening of 11/21/2021.  She began having nausea and vomiting throughout the night from 11/21/2021 to 11/22/2021.  She notified her daughter on the morning of 11/22/2021 and she was brought to the emergency department for further evaluation.  [facility]  The patient was evaluated by psychiatry who recommended inpatient evaluation and treatment.  While waiting for an inpatient Geri psychiatric bed, the patient developed atrial fibrillation with RVR on the morning of 11/24/2021 with heart rate in the 170s.  She was given a diltiazem bolus, 5 mg and then started on a drip.  Admission was requested. The patient denies any fever, chills, headache, chest pain, shortness breath, vomiting, diarrhea.  She has chronic nausea.  She has not had a bowel movement.  There is no hematochezia or melena.  She denies any coughing or hemoptysis. In the ED, the patient was afebrile hemodynamically stable albeit with soft blood pressures after starting diltiazem.  Oxygen saturation is 96% on 2 L.  WBC 7.1, hemoglobin 12.3, platelets 228,000.  BMP showed sodium 137, potassium 3.9, bicarbonate 27, serum creatinine 0.64.  EKG showed atrial fibrillation with nonspecific ST-T wave change.  Urine drug screen was positive for opiates.  Cardiology was consulted to assist with management.  Fortunately, patient spontaneously converted to sinus in late morning 11/24/21 and she remained in sinus thereafter.  Cardiology changed her regimen to metoprolol 25 mg bid and midodrine was  stopped.  Patient remained stable without new afib.  Metoprolol was decreased to 12.5 mg bid and HR remained controlled and pt remained hemodynamically stable.  Prolonged hospitalization due to difficulty finding available [facility] facility. She is cleared medically to go to [facility] facility.    Assessment and Plan: Depression with anxiety Currently with suicidal ideation [place] telepsychiatry to follow>>recommended d/c to inpatient [facility] facility Continue fluoxetine, mirtazapine 9/11--had a little agitation today>>gave one time dose ativan Medically stable to go to [place]  Lumbar compression fracture ([place]) CT abd--L5 compression deformity is new since 12/11/2018 loss of approximately 25% of the central vertebral body height. >>TLSO brace  Dysuria CT renal--Bilateral nephrolithiasis. No signs of hydronephrosis, hydroureter or ureteral lithiasis UA--no pyuria  Impaired glucose tolerance 05/24/21 A1C--6.2 CBG controlled during hospitalization Outpatient PCP follow up  Essential hypertension Stopped amlodipine and spironolactone to allow BP margin to titrate metoprolol --BP controlled  Opioid dependence ([place]) PDMP reviewed --norco 10/325 #120 monthly --continue norco  Atrial fibrillation with RVR ([place]) CHADSVASc = 5 Continue diltiazem drip>>stopped after patient spontaneously converted to sinus in late morning 11/24/21 Cardiology consulted>>initially placed on metoprolol tid>>bid Now back in sinus with PACs 11/24/21 Echo EF 60-65%, G1DD, mild TR/MR, RVSP 40.7 TSH--0.575 Pt remained in sinus and hemodynamically stable and was cleared for d/c by cardiology Patient refuses anticoagulation after explanation of risks/benefits/alternatives -9/8 metoprolol decreased to 12.5 mg bid due to soft BP --episode may have been triggered by patient's excess caffeine intake and from Excedrin --9/11 remains in sinus, hemodynamically stable  Chronic respiratory failure with hypoxia and hypercapnia  (HCC) Chronically on 2L stable  Tobacco abuse Tobacco  cessation discussed  COPD (chronic obstructive pulmonary disease) ([place]) Continue Breztri  Hepatocellular carcinoma ([place]) Currently under care of Dr. Burr Medico at [facility] --over 5 yrs from diagnosis --sp partical hepatectomy --under observation, not on active tx    Family Communication:  daughter updated 9/10   Consultants:  telepsychiatry, cardiology   Code Status:  DNR   DVT Prophylaxis:  [place] Lovenox     Procedures: As Listed in Progress Note Above   Antibiotics: None        Subjective: Patient frustrated being here.  Complains of back pain.  Denies n/v/d.  Objective: Vitals:   11/29/21 0831 11/29/21 0837 11/29/21 1312 11/29/21 1654  BP:  (!) 146/54 121/66 132/81  Pulse:  70 64 62  Resp:      Temp:      TempSrc:      SpO2: 94% 97% 99% 100%  Weight:      Height:        Intake/Output Summary (Last 24 hours) at 11/29/2021 1706 Last data filed at 11/29/2021 1300 Gross per 24 hour  Intake 960 ml  Output 400 ml  Net 560 ml   Weight change:  Exam:  General:  Pt is alert, follows commands appropriately, not in acute distress HEENT: No icterus, No thrush, No neck mass, [place]/AT Cardiovascular: RRR, S1/S2, no rubs, no gallops Respiratory: diminished BS but CTA bilaterally, no wheezing, no crackles, no rhonchi Abdomen: Soft/+BS, non tender, non distended, no guarding Extremities: No edema, No lymphangitis, No petechiae, No rashes, no synovitis   Data Reviewed: I have personally reviewed following labs and imaging studies Basic Metabolic Panel: Recent Labs  Lab 11/24/21 0721 11/25/21 0532  NA 137 137  K 3.9 4.2  CL 101 102  CO2 27 30  GLUCOSE 105* 89  BUN 18 21  CREATININE 0.64 0.68  CALCIUM 8.8* 8.7*  MG 2.1 2.0   Liver Function Tests: Recent Labs  Lab 11/22/21 1923  AST 19  ALT 13  ALKPHOS 68  BILITOT 0.8  PROT 7.1  ALBUMIN 3.9   No results for input(s): "LIPASE", "AMYLASE"  in the last 168 hours. No results for input(s): "AMMONIA" in the last 168 hours. Coagulation Profile: Recent Labs  Lab 11/24/21 0721  INR 1.1   CBC: Recent Labs  Lab 11/24/21 0721  WBC 7.1  HGB 12.3  HCT 39.6  MCV 97.3  PLT 228   Cardiac Enzymes: No results for input(s): "CKTOTAL", "CKMB", "CKMBINDEX", "TROPONINI" in the last 168 hours. BNP: Invalid input(s): "POCBNP" CBG: No results for input(s): "GLUCAP" in the last 168 hours. HbA1C: No results for input(s): "HGBA1C" in the last 72 hours. Urine analysis:    Component Value Date/Time   COLORURINE YELLOW 11/28/2021 [place] 11/28/2021 1119   LABSPEC 1.010 11/28/2021 1119   PHURINE 6.0 11/28/2021 1119   GLUCOSEU NEGATIVE 11/28/2021 1119   HGBUR NEGATIVE 11/28/2021 1119   BILIRUBINUR NEGATIVE 11/28/2021 1119   BILIRUBINUR negative 05/07/2021 1618   BILIRUBINUR negative 05/15/2018 0859   KETONESUR 5 (A) 11/28/2021 1119   PROTEINUR NEGATIVE 11/28/2021 1119   UROBILINOGEN 0.2 05/07/2021 1618   NITRITE NEGATIVE 11/28/2021 1119   LEUKOCYTESUR NEGATIVE 11/28/2021 1119   Sepsis Labs: '@LABRCNTIP'$ (procalcitonin:4,lacticidven:4) ) Recent Results (from the past 240 hour(s))  SARS Coronavirus 2 by RT PCR (hospital order, performed in [facility] hospital lab) *cepheid single result test* Anterior Nasal Swab     Status: None   Collection Time: 11/22/21  4:26 PM   Specimen: Anterior  Nasal Swab  Result Value Ref Range Status   SARS Coronavirus 2 by RT PCR NEGATIVE NEGATIVE Final    Comment: (NOTE) SARS-CoV-2 target nucleic acids are NOT DETECTED.  The SARS-CoV-2 RNA is generally detectable in upper and lower respiratory specimens during the acute phase of infection. The lowest concentration of SARS-CoV-2 viral copies this assay can detect is 250 copies / mL. A negative result does not preclude SARS-CoV-2 infection and should not be used as the sole basis for treatment or other patient management decisions.   A negative result may occur with improper specimen collection / handling, submission of specimen other than nasopharyngeal swab, presence of viral mutation(s) within the areas targeted by this assay, and inadequate number of viral copies (<250 copies / mL). A negative result must be combined with clinical observations, patient history, and epidemiological information.  Fact Sheet for Patients:   https://www.patel.info/  Fact Sheet for Healthcare Providers: https://hall.com/  This test is not yet approved or  cleared by the [place] FDA and has been authorized for detection and/or diagnosis of SARS-CoV-2 by FDA under an Emergency Use Authorization (EUA).  This EUA will remain in effect (meaning this test can be used) for the duration of the COVID-19 declaration under Section 564(b)(1) of the Act, 21 U.S.C. section 360bbb-3(b)(1), unless the authorization is terminated or revoked sooner.  Performed at [facility], [address], [place], [place] [address]   MRSA Next Gen by PCR, Nasal     Status: None   Collection Time: 11/24/21 11:55 AM   Specimen: Nasal Mucosa; Nasal Swab  Result Value Ref Range Status   MRSA by PCR Next Gen NOT DETECTED NOT DETECTED Final    Comment: (NOTE) The GeneXpert MRSA Assay (FDA approved for NASAL specimens only), is one component of a comprehensive MRSA colonization surveillance program. It is not intended to diagnose MRSA infection nor to guide or monitor treatment for MRSA infections. Test performance is not FDA approved in patients less than 5 years old. Performed at [facility], [address] [place], [place] [address]      Scheduled Meds:  Chlorhexidine Gluconate Cloth  6 each Topical Q0600   cholecalciferol  2,000 Units Oral QPC lunch   enoxaparin (LOVENOX) injection  40 mg Subcutaneous Q24H   FLUoxetine  20 mg Oral Daily   gabapentin  300 mg Oral QHS   metoprolol tartrate  12.5 mg Oral BID    mirtazapine  15 mg Oral QHS   mometasone-formoterol  2 puff Inhalation BID   multivitamin with minerals  1 tablet Oral Daily   pantoprazole  40 mg Oral Daily   polyethylene glycol  17 g Oral Daily   risperiDONE  0.25 mg Oral [id number]   senna  2 tablet Oral Daily   simethicone  80 mg Oral QID   Continuous Infusions:  Procedures/Studies:   Orson Eva, DO  [facility]  If 7PM-7AM, please contact night-coverage [email or web address] Password [email or web address] 11/29/2021, 5:06 PM   LOS: 5 days

## 2021-11-30 DIAGNOSIS — F418 Other specified anxiety disorders: Secondary | ICD-10-CM | POA: Diagnosis not present

## 2021-11-30 DIAGNOSIS — I4891 Unspecified atrial fibrillation: Secondary | ICD-10-CM | POA: Diagnosis not present

## 2021-11-30 DIAGNOSIS — C220 Liver cell carcinoma: Secondary | ICD-10-CM | POA: Diagnosis not present

## 2021-11-30 DIAGNOSIS — J9611 Chronic respiratory failure with hypoxia: Secondary | ICD-10-CM | POA: Diagnosis not present

## 2021-11-30 LAB — URINE CULTURE: Culture: <10000 — AB

## 2021-11-30 NOTE — Progress Notes (Signed)
Inpatient Behavioral Health Placement  Pt continues to meet inpatient behavioral health placement and has been faxed out to out of network providers as listed.   CSW attempted to contact [facility] at [phone] and [phone], CSW left HIPPA Complaint VM and requested a phone call back.    Destination Service Provider Address Phone Fax  [facility]  [address] [place] [address] [phone]  [facility]  [address] [place] [address] [phone] [facility]  [place], [place] [address] [phone]  [facility] Center-Adult  [place], [place] [address] [phone]  [facility]  [address] [place] [address] [place]  [facility]  [address], [place] [address] [phone]  [place] [address] [address] [place] [address] [phone]  [facility]  [address], [place] [address] [phone] [place]  [address], [place] [address] [phone] [place]  [address] [place] [address] [phone]  [facility] Center-Geriatric  [place], [place] [address] [phone]  [place] [address] [place] [address] [phone]  [facility] [place]  [place] [address] [phone]  [place], [place] [address] [phone] [facility]  [place], [place] [address] [phone] [facility]  [address], [place] [address] [phone] [facility]  [place], [place] [address] [phone]  [facility]  [address] [place] [address] [phone]  [facility]  [address] [place] [address] [phone] [place] [address], [place] [address] [phone] [place]  [address] [place] [address] [phone] [facility]  [address], [place] [address] [phone] [place]  [address], [place] [address] [phone]   Situation ongoing,  CSW will follow up.   Benjaman Kindler, MSW, LCSWA 11/30/2021  @ 11:48 AM

## 2021-11-30 NOTE — TOC Progression Note (Signed)
Transition of Care [facility]) - Progression Note    Patient Details  Name: FARTUN PARADISO MRN: [id number] Date of Birth: 1945/07/26  [facility]) CM/SW Contact  Salome Arnt, [place] Phone Number: 11/30/2021, 10:55 AM  Clinical Narrative:  Called pt's [name] at her request to discuss disposition. [name] reports frustration with how long it is taking to find pt psych placement. LCSW explained that behavioral health is checking daily for bed availability and that this process can take some time. She wants to make sure that she is notified when placement is found. MD and RN updated.        Barriers to Discharge: Psych Bed not available  Expected Discharge Plan and Services                                                 Social Determinants of Health (SDOH) Interventions Depression Interventions/Treatment : Referral to Psychiatry, Medication, Counseling  Readmission Risk Interventions     No data to display

## 2021-11-30 NOTE — Progress Notes (Signed)
Pt denied at [facility] due to use of O2 2L. CSW staffed case with Renelda Loma Intake RN who has agreed to review referral. CSW awaiting follow up from [place]. CSW will assist and follow with inpatient behavioral health placement.    Benjaman Kindler, MSW, LCSWA 11/30/2021 5:00 PM

## 2021-11-30 NOTE — Progress Notes (Signed)
PROGRESS NOTE  Tina Bailey IRS:[id number] DOB: 1945/04/21 DOA: 11/22/2021 PCP: Vilinda Boehringer, NP  Brief History:  76 year old female with a history of COPD, hepatocellular carcinoma, opioid dependence with chronic abdominal and chronic back pain, depression, tobacco abuse, hypertension, chronic respiratory failure on 2 L presenting with suicide attempt.  The patient presented to the ED on 11/22/2021 after taking 28 Excedrin on the evening of 11/21/2021.  She began having nausea and vomiting throughout the night from 11/21/2021 to 11/22/2021.  She notified her daughter on the morning of 11/22/2021 and she was brought to the emergency department for further evaluation.  [facility]  The patient was evaluated by psychiatry who recommended inpatient evaluation and treatment.  While waiting for an inpatient Geri psychiatric bed, the patient developed atrial fibrillation with RVR on the morning of 11/24/2021 with heart rate in the 170s.  She was given a diltiazem bolus, 5 mg and then started on a drip.  Admission was requested. The patient denies any fever, chills, headache, chest pain, shortness breath, vomiting, diarrhea.  She has chronic nausea.  She has not had a bowel movement.  There is no hematochezia or melena.  She denies any coughing or hemoptysis. In the ED, the patient was afebrile hemodynamically stable albeit with soft blood pressures after starting diltiazem.  Oxygen saturation is 96% on 2 L.  WBC 7.1, hemoglobin 12.3, platelets 228,000.  BMP showed sodium 137, potassium 3.9, bicarbonate 27, serum creatinine 0.64.  EKG showed atrial fibrillation with nonspecific ST-T wave change.  Urine drug screen was positive for opiates.  Cardiology was consulted to assist with management.  Fortunately, patient spontaneously converted to sinus in late morning 11/24/21 and she remained in sinus thereafter.  Cardiology changed her regimen to metoprolol 25 mg bid and midodrine was  stopped.  Patient remained stable without new afib.  Metoprolol was decreased to 12.5 mg bid and HR remained controlled and pt remained hemodynamically stable.  Prolonged hospitalization due to difficulty finding available [facility] facility. She is cleared medically to go to [facility] facility.     Assessment and Plan: Depression with anxiety Currently with suicidal ideation [facility] telepsychiatry to follow>>recommended d/c to inpatient [facility] facility Continue fluoxetine, mirtazapine 9/11--had a little agitation today>>gave one time dose ativan 9/12--no further agitation Medically stable to go to [facility]  Lumbar compression fracture ([place]) CT abd--L5 compression deformity is new since 12/11/2018 loss of approximately 25% of the central vertebral body height. >>TLSO brace  Dysuria CT renal--Bilateral nephrolithiasis. No signs of hydronephrosis, hydroureter or ureteral lithiasis UA--no pyuria  Impaired glucose tolerance 05/24/21 A1C--6.2 CBG controlled during hospitalization Outpatient PCP follow up  Essential hypertension Stopped amlodipine and spironolactone to allow BP margin to titrate metoprolol --BP controlled  Opioid dependence ([place]) PDMP reviewed --norco 10/325 #120 monthly --continue norco  Atrial fibrillation with RVR ([place]) CHADSVASc = 5 Continue diltiazem drip>>stopped after patient spontaneously converted to sinus in late morning 11/24/21 Cardiology consulted>>initially placed on metoprolol tid>>bid Now back in sinus with PACs 11/24/21 Echo EF 60-65%, G1DD, mild TR/MR, RVSP 40.7 TSH--0.575 Pt remained in sinus and hemodynamically stable and was cleared for d/c by cardiology Patient refuses anticoagulation after explanation of risks/benefits/alternatives -9/8 metoprolol decreased to 12.5 mg bid due to soft BP --episode may have been triggered by patient's excess caffeine intake and from Excedrin --9/12 remains in sinus, hemodynamically stable-no further afib  Chronic  respiratory failure with hypoxia and hypercapnia ([place]) Chronically on  2L stable  Tobacco abuse Tobacco cessation discussed  COPD (chronic obstructive pulmonary disease) ([place]) Continue Breztri  Hepatocellular carcinoma ([place]) Currently under care of Dr. Burr Medico at [facility] --over 5 yrs from diagnosis --sp partical hepatectomy --under observation, not on active tx   Family Communication:  daughter updated 9/12   Consultants:  telepsychiatry, cardiology   Code Status:  DNR   DVT Prophylaxis:  [place] Lovenox     Procedures: As Listed in Progress Note Above   Antibiotics: None     Subjective: She complains of back pain, about same.  Denies incontinence or new leg weakness.  Patient denies fevers, chills, headache, chest pain, dyspnea, nausea, vomiting, diarrhea, abdominal pain, dysuria,   Objective: Vitals:   11/30/21 0725 11/30/21 0758 11/30/21 1116 11/30/21 1129  BP:  (!) 127/53 (!) 104/36 (!) 117/53  Pulse:  60 62 (!) 59  Resp:      Temp:      TempSrc:      SpO2: 99%  99% 100%  Weight:      Height:        Intake/Output Summary (Last 24 hours) at 11/30/2021 1621 Last data filed at 11/30/2021 1616 Gross per 24 hour  Intake 480 ml  Output 375 ml  Net 105 ml   Weight change:  Exam:  General:  Pt is alert, follows commands appropriately, not in acute distress HEENT: No icterus, No thrush, No neck mass, [place]/AT Cardiovascular: RRR, S1/S2, no rubs, no gallops Respiratory: diminished BS but CTA bilaterally, no wheezing, no crackles, no rhonchi Abdomen: Soft/+BS, non tender, non distended, no guarding Extremities: No edema, No lymphangitis, No petechiae, No rashes, no synovitis   Data Reviewed: I have personally reviewed following labs and imaging studies Basic Metabolic Panel: Recent Labs  Lab 11/24/21 0721 11/25/21 0532  NA 137 137  K 3.9 4.2  CL 101 102  CO2 27 30  GLUCOSE 105* 89  BUN 18 21  CREATININE 0.64 0.68  CALCIUM 8.8* 8.7*  MG 2.1 2.0    Liver Function Tests: No results for input(s): "AST", "ALT", "ALKPHOS", "BILITOT", "PROT", "ALBUMIN" in the last 168 hours. No results for input(s): "LIPASE", "AMYLASE" in the last 168 hours. No results for input(s): "AMMONIA" in the last 168 hours. Coagulation Profile: Recent Labs  Lab 11/24/21 0721  INR 1.1   CBC: Recent Labs  Lab 11/24/21 0721  WBC 7.1  HGB 12.3  HCT 39.6  MCV 97.3  PLT 228   Cardiac Enzymes: No results for input(s): "CKTOTAL", "CKMB", "CKMBINDEX", "TROPONINI" in the last 168 hours. BNP: Invalid input(s): "POCBNP" CBG: No results for input(s): "GLUCAP" in the last 168 hours. HbA1C: No results for input(s): "HGBA1C" in the last 72 hours. Urine analysis:    Component Value Date/Time   COLORURINE YELLOW 11/28/2021 [place] 11/28/2021 1119   LABSPEC 1.010 11/28/2021 1119   PHURINE 6.0 11/28/2021 1119   GLUCOSEU NEGATIVE 11/28/2021 1119   HGBUR NEGATIVE 11/28/2021 1119   BILIRUBINUR NEGATIVE 11/28/2021 1119   BILIRUBINUR negative 05/07/2021 1618   BILIRUBINUR negative 05/15/2018 0859   KETONESUR 5 (A) 11/28/2021 1119   PROTEINUR NEGATIVE 11/28/2021 1119   UROBILINOGEN 0.2 05/07/2021 1618   NITRITE NEGATIVE 11/28/2021 1119   LEUKOCYTESUR NEGATIVE 11/28/2021 1119   Sepsis Labs: '@LABRCNTIP'$ (procalcitonin:4,lacticidven:4) ) Recent Results (from the past 240 hour(s))  SARS Coronavirus 2 by RT PCR (hospital order, performed in [facility] hospital lab) *cepheid single result test* Anterior Nasal Swab     Status: None  Collection Time: 11/22/21  4:26 PM   Specimen: Anterior Nasal Swab  Result Value Ref Range Status   SARS Coronavirus 2 by RT PCR NEGATIVE NEGATIVE Final    Comment: (NOTE) SARS-CoV-2 target nucleic acids are NOT DETECTED.  The SARS-CoV-2 RNA is generally detectable in upper and lower respiratory specimens during the acute phase of infection. The lowest concentration of SARS-CoV-2 viral copies this assay can detect  is 250 copies / mL. A negative result does not preclude SARS-CoV-2 infection and should not be used as the sole basis for treatment or other patient management decisions.  A negative result may occur with improper specimen collection / handling, submission of specimen other than nasopharyngeal swab, presence of viral mutation(s) within the areas targeted by this assay, and inadequate number of viral copies (<250 copies / mL). A negative result must be combined with clinical observations, patient history, and epidemiological information.  Fact Sheet for Patients:   https://www.patel.info/  Fact Sheet for Healthcare Providers: https://hall.com/  This test is not yet approved or  cleared by the [place] FDA and has been authorized for detection and/or diagnosis of SARS-CoV-2 by FDA under an Emergency Use Authorization (EUA).  This EUA will remain in effect (meaning this test can be used) for the duration of the COVID-19 declaration under Section 564(b)(1) of the Act, 21 U.S.C. section 360bbb-3(b)(1), unless the authorization is terminated or revoked sooner.  Performed at [facility], [address], [place], [place] [address]   MRSA Next Gen by PCR, Nasal     Status: None   Collection Time: 11/24/21 11:55 AM   Specimen: Nasal Mucosa; Nasal Swab  Result Value Ref Range Status   MRSA by PCR Next Gen NOT DETECTED NOT DETECTED Final    Comment: (NOTE) The GeneXpert MRSA Assay (FDA approved for NASAL specimens only), is one component of a comprehensive MRSA colonization surveillance program. It is not intended to diagnose MRSA infection nor to guide or monitor treatment for MRSA infections. Test performance is not FDA approved in patients less than 84 years old. Performed at [facility], [address], [place], [place] [address]   Urine Culture     Status: Abnormal   Collection Time: 11/28/21 11:19 AM   Specimen: Urine, Clean Catch   Result Value Ref Range Status   Specimen Description   Final    URINE, CLEAN CATCH Performed at [facility], [address], [place], [place] [address]    Special Requests   Final    NONE Performed at [facility], [address] [place], [place] [address]    Culture (A)  Final    <10,000 COLONIES/mL INSIGNIFICANT GROWTH Performed at [facility], [place] [address], [place], [place] [address]    Report Status 11/30/2021 FINAL  Final     Scheduled Meds:  cholecalciferol  2,000 Units Oral QPC lunch   enoxaparin (LOVENOX) injection  40 mg Subcutaneous Q24H   FLUoxetine  20 mg Oral Daily   gabapentin  300 mg Oral QHS   metoprolol tartrate  12.5 mg Oral BID   mirtazapine  15 mg Oral QHS   mometasone-formoterol  2 puff Inhalation BID   multivitamin with minerals  1 tablet Oral Daily   pantoprazole  40 mg Oral Daily   polyethylene glycol  17 g Oral Daily   risperiDONE  0.25 mg Oral [id number]   senna  2 tablet Oral Daily   simethicone  80 mg Oral QID   Continuous Infusions:  Procedures/Studies:  Orson Eva, DO  [facility]  If 7PM-7AM, please contact night-coverage [email or web address] Password [email or web address] 11/30/2021, 4:21 PM   LOS: 6 days

## 2021-11-30 NOTE — BH Assessment (Addendum)
Followed up with [facility], spoke to [place]. States that patient was accepted 11/26/2021 and they called for 2 days, unable to reach  staff. Therefore, patient's bed was given away and no longer available. Clinician confirmed that Jackelyn Poling was calling the correct number.Morning staff to contact [facility] to inquire about bed availability and discharges.  Patient re-faxed out to the following facilities for consideration of bed placement.   Destination Service Provider Request Status Selected Services Address Phone Fax Patient Preferred  [address] [place] [address] [phone] --  [facility] N/A [address] [place] [address] [phone] --  [place] N/A [place], [place] [address] [phone] --  [facility]  Pending - Request Sent N/A [address] [place] [address] [phone] --  [place] [address] [place] [address] [phone] --  [facility]  Pending - Request Sent N/A [address] [place] [address] [phone] --  [facility]  Pending - Request Sent N/A [address], [place] [address] [phone] --  [facility]  Pending - Request Sent N/A [address], [place] [address] [phone] --  [place] Sent N/A [address], [place] [address] [phone] --  [place]  Pending - Request Sent N/A [place], [place] [address] [phone] --  [facility]  Pending - Request Sent N/A [address], [place] [address] [phone] --  [facility]  Pending - Request Sent N/A [address] [place] [address] [phone] --  [facility]  Pending - Request Sent N/A [place] [place] [address] [phone] --  [place] [address] [place] [address] [phone] --  [place], [place] [address] [phone] --  [facility]  Pending - Request Sent N/A [address] [place] [address] [phone] --  [facility]  Pending - Request Sent N/A [address] [place], [place] [address] [phone] --  [facility]  Pending - Request Sent N/A [address] [place] [address] [place] N/A [place], [place] [address] [phone] --  [facility]  Pending - Request Sent N/A [address], [place] [address] [phone] --  [facility]  Pending - Request Sent N/A [address], [place] [address] [phone] --  [facility]  Pending - Request Sent N/A [address], [place] [address] [phone]

## 2021-12-01 DIAGNOSIS — J9611 Chronic respiratory failure with hypoxia: Secondary | ICD-10-CM | POA: Diagnosis not present

## 2021-12-01 DIAGNOSIS — T50902D Poisoning by unspecified drugs, medicaments and biological substances, intentional self-harm, subsequent encounter: Secondary | ICD-10-CM | POA: Diagnosis not present

## 2021-12-01 DIAGNOSIS — J9612 Chronic respiratory failure with hypercapnia: Secondary | ICD-10-CM | POA: Diagnosis not present

## 2021-12-01 DIAGNOSIS — I4891 Unspecified atrial fibrillation: Secondary | ICD-10-CM | POA: Diagnosis not present

## 2021-12-01 MED ORDER — DOCUSATE SODIUM 100 MG PO CAPS
100.0000 mg | ORAL_CAPSULE | Freq: Two times a day (BID) | ORAL | Status: DC
  Administered 2021-12-01 (×2): 100 mg via ORAL
  Filled 2021-12-01 (×6): qty 1

## 2021-12-01 NOTE — Progress Notes (Signed)
PROGRESS NOTE  Tina Bailey OBS:[id number] DOB: 1945-05-24 DOA: 11/22/2021 PCP: Vilinda Boehringer, NP  Brief History:  76 year old female with a history of COPD, hepatocellular carcinoma, opioid dependence with chronic abdominal and chronic back pain, depression, tobacco abuse, hypertension, chronic respiratory failure on 2 L presenting with suicide attempt.  The patient presented to the ED on 11/22/2021 after taking 28 Excedrin on the evening of 11/21/2021.  She began having nausea and vomiting throughout the night from 11/21/2021 to 11/22/2021.  She notified her daughter on the morning of 11/22/2021 and she was brought to the emergency department for further evaluation.  [facility]  The patient was evaluated by psychiatry who recommended inpatient evaluation and treatment.  While waiting for an inpatient Ger[facility] psychiatric bed, the patient developed atrial fibrillation with RVR on the morning of 11/24/2021 with heart rate in the 170s.  She was given a diltiazem bolus, 5 mg and then started on a drip.  Admission was requested. The patient denies any fever, chills, headache, chest pain, shortness breath, vomiting, diarrhea.  She has chronic nausea.  She has not had a bowel movement.  There is no hematochezia or melena.  She denies any coughing or hemoptysis. In the ED, the patient was afebrile hemodynamically stable albeit with soft blood pressures after starting diltiazem.  Oxygen saturation is 96% on 2 L.  WBC 7.1, hemoglobin 12.3, platelets 228,000.  BMP showed sodium 137, potassium 3.9, bicarbonate 27, serum creatinine 0.64.  EKG showed atrial fibrillation with nonspecific ST-T wave change.  Urine drug screen was positive for opiates.  Cardiology was consulted to assist with management.  Fortunately, patient spontaneously converted to sinus in late morning 11/24/21 and she remained in sinus thereafter.  Cardiology changed her regimen to metoprolol 25 mg bid and midodrine was  stopped.  Patient remained stable without new afib.  Metoprolol was decreased to 12.5 mg bid and HR remained controlled and pt remained hemodynamically stable.  Prolonged hospitalization due to difficulty finding available [facility] facility. She is cleared medically to go to [facility] facility.     Assessment and Plan: Depression with anxiety Currently still with suicidal ideation.  No plans. [place] telepsychiatry to follow>>recommended d/c to inpatient [facility] facility. Continue fluoxetine, mirtazapine 9/11--had a little agitation today>>gave one time dose ativan 9/12--no further agitation.  Patient is hemodynamically stable to discharge to psychiatric facility when bed available. -Continue supportive care and consult reorientation.   Lumbar compression fracture (HCC) CT abd--L5 compression deformity is new since 12/11/2018 loss of approximately 25% of the central vertebral body height. >>TLSO brace  Dysuria CT renal--Bilateral nephrolithiasis. No signs of hydronephrosis, hydroureter or ureteral lithiasis UA--no pyuria  Impaired glucose tolerance 05/24/21 A1C--6.2 CBG controlled during hospitalization Outpatient PCP follow up  Essential hypertension Stopped amlodipine and spironolactone to allow BP margin to titrate metoprolol --BP controlled  Opioid dependence ([place]) PDMP reviewed --norco 10/325 #120 monthly --continue norco  Atrial fibrillation with RVR ([place]) CHADSVASc = 5 Continue diltiazem drip>>stopped after patient spontaneously converted to sinus in late morning 11/24/21 Cardiology consulted>>initially placed on metoprolol tid>>bid Now back in sinus with PACs 11/24/21 Echo EF 60-65%, G1DD, mild TR/MR, RVSP 40.7 TSH--0.575 Pt remained in sinus and hemodynamically stable and was cleared for d/c by cardiology Patient refuses anticoagulation after explanation of risks/benefits/alternatives -9/8 metoprolol decreased to 12.5 mg bid due to soft BP --episode may have been triggered by  patient's excess caffeine intake and from Excedrin --9/12 remains  in sinus, hemodynamically stable-no further afib  Chronic respiratory failure with hypoxia and hypercapnia (HCC) Chronically on 2L stable  Tobacco abuse Tobacco cessation discussed  COPD (chronic obstructive pulmonary disease) ([place]) Continue Breztri  Hepatocellular carcinoma ([place]) Currently under care of Dr. Burr Medico at [facility] --over 5 yrs from diagnosis --sp partical hepatectomy --under observation, not on active tx   Family Communication:  daughter updated 9/12   Consultants:  telepsychiatry, cardiology   Code Status:  DNR   DVT Prophylaxis:  [place] Lovenox     Procedures: As Listed in Progress Note Above   Antibiotics: None     Subjective: No overnight events; no chest pain, no nausea or vomiting.  Has remained hemodynamically stable and he is waiting for bed availability at psychiatric facility.   Objective: Vitals:   12/01/21 0501 12/01/21 0738 12/01/21 0830 12/01/21 1230  BP: (!) 100/51  (!) 100/51 (!) 104/55  Pulse: (!) 58  68 60  Resp: 18   16  Temp: 98.3 F (36.8 C)   98.6 F (37 C)  TempSrc: Oral     SpO2: 100% 98%  99%  Weight:      Height:        Intake/Output Summary (Last 24 hours) at 12/01/2021 1906 Last data filed at 12/01/2021 1700 Gross per 24 hour  Intake 1080 ml  Output 800 ml  Net 280 ml   Weight change:  Exam: General exam: Alert, awake, oriented x 3; flat affect and in no acute distress.  No overnight events.  Respiratory system: Clear to auscultation. Respiratory effort normal.  Good saturation on chronic supplementation (2 L nasal cannula). Cardiovascular system:RRR. No murmurs, rubs, gallops. Gastrointestinal system: Abdomen is nondistended, soft and nontender. No organomegaly or masses felt. Normal bowel sounds heard. Central nervous system: Alert and oriented. No focal neurological deficits. Extremities: No cyanosis or clubbing. Skin: No  petechiae. Psychiatry: Judgement and insight appear normal. Mood & affect appropriate.    Data Reviewed: I have personally reviewed following labs and imaging studies Basic Metabolic Panel: Recent Labs  Lab 11/25/21 0532  NA 137  K 4.2  CL 102  CO2 30  GLUCOSE 89  BUN 21  CREATININE 0.68  CALCIUM 8.7*  MG 2.0   Urine analysis:    Component Value Date/Time   COLORURINE YELLOW 11/28/2021 [place] 11/28/2021 1119   LABSPEC 1.010 11/28/2021 1119   PHURINE 6.0 11/28/2021 1119   GLUCOSEU NEGATIVE 11/28/2021 1119   HGBUR NEGATIVE 11/28/2021 1119   BILIRUBINUR NEGATIVE 11/28/2021 1119   BILIRUBINUR negative 05/07/2021 1618   BILIRUBINUR negative 05/15/2018 0859   KETONESUR 5 (A) 11/28/2021 1119   PROTEINUR NEGATIVE 11/28/2021 1119   UROBILINOGEN 0.2 05/07/2021 1618   NITRITE NEGATIVE 11/28/2021 1119   LEUKOCYTESUR NEGATIVE 11/28/2021 1119   Sepsis Labs: '@LABRCNTIP'$ (procalcitonin:4,lacticidven:4) ) Recent Results (from the past 240 hour(s))  SARS Coronavirus 2 by RT PCR (hospital order, performed in [place] hospital lab) *cepheid single result test* Anterior Nasal Swab     Status: None   Collection Time: 11/22/21  4:26 PM   Specimen: Anterior Nasal Swab  Result Value Ref Range Status   SARS Coronavirus 2 by RT PCR NEGATIVE NEGATIVE Final    Comment: (NOTE) SARS-CoV-2 target nucleic acids are NOT DETECTED.  The SARS-CoV-2 RNA is generally detectable in upper and lower respiratory specimens during the acute phase of infection. The lowest concentration of SARS-CoV-2 viral copies this assay can detect is 250 copies / mL. A negative  result does not preclude SARS-CoV-2 infection and should not be used as the sole basis for treatment or other patient management decisions.  A negative result may occur with improper specimen collection / handling, submission of specimen other than nasopharyngeal swab, presence of viral mutation(s) within the areas targeted  by this assay, and inadequate number of viral copies (<250 copies / mL). A negative result must be combined with clinical observations, patient history, and epidemiological information.  Fact Sheet for Patients:   https://www.patel.info/  Fact Sheet for Healthcare Providers: https://hall.com/  This test is not yet approved or  cleared by the [place] FDA and has been authorized for detection and/or diagnosis of SARS-CoV-2 by FDA under an Emergency Use Authorization (EUA).  This EUA will remain in effect (meaning this test can be used) for the duration of the COVID-19 declaration under Section 564(b)(1) of the Act, 21 U.S.C. section 360bbb-3(b)(1), unless the authorization is terminated or revoked sooner.  Performed at [facility], [address], [place], [place] [address]   MRSA Next Gen by PCR, Nasal     Status: None   Collection Time: 11/24/21 11:55 AM   Specimen: Nasal Mucosa; Nasal Swab  Result Value Ref Range Status   MRSA by PCR Next Gen NOT DETECTED NOT DETECTED Final    Comment: (NOTE) The GeneXpert MRSA Assay (FDA approved for NASAL specimens only), is one component of a comprehensive MRSA colonization surveillance program. It is not intended to diagnose MRSA infection nor to guide or monitor treatment for MRSA infections. Test performance is not FDA approved in patients less than 57 years old. Performed at [facility], [address] [place], [place] [address]   Urine Culture     Status: Abnormal   Collection Time: 11/28/21 11:19 AM   Specimen: Urine, Clean Catch  Result Value Ref Range Status   Specimen Description   Final    URINE, CLEAN CATCH Performed at [facility], [address] [place], [place] [address]    Special Requests   Final    NONE Performed at [facility], [address] [employer], [place] [address]    Culture (A)  Final    <10,000 COLONIES/mL INSIGNIFICANT GROWTH Performed at [facility], [place] [address] [place], [place] [address]    Report Status 11/30/2021 FINAL  Final     Scheduled Meds:  cholecalciferol  2,000 Units Oral QPC lunch   docusate sodium  100 mg Oral BID   enoxaparin (LOVENOX) injection  40 mg Subcutaneous Q24H   FLUoxetine  20 mg Oral Daily   gabapentin  300 mg Oral QHS   metoprolol tartrate  12.5 mg Oral BID   mirtazapine  15 mg Oral QHS   mometasone-formoterol  2 puff Inhalation BID   multivitamin with minerals  1 tablet Oral Daily   pantoprazole  40 mg Oral Daily   polyethylene glycol  17 g Oral Daily   risperiDONE  0.25 mg Oral [id number]   simethicone  80 mg Oral QID   Continuous Infusions:  Procedures/Studies:  Barton Dubois, MD  [facility]  If 7PM-7AM, please contact night-coverage [email or web address] Password [email or web address] 12/01/2021, 7:06 PM   LOS: 7 days

## 2021-12-01 NOTE — Progress Notes (Addendum)
Update-Pt has been denied by [facility] due to their acuity.   This CSW just spoke with Louie Casa, RN Intake at [facility]. Louie Casa advised that [facility] does accepts pt that require O2, however shared that their gero unit is acute at the moment but is requesting provider to review this pt. Rady shared that she would call this CSW back with an update. CSW will assist and following.    Benjaman Kindler, MSW, [facility] 12/01/2021 4:03 PM

## 2021-12-01 NOTE — Progress Notes (Signed)
Patient has been denied by [facility] due to no appropriate beds available. Patient meets [facility] inpatient criteria per Garrison Columbus, FNP. Patient has been faxed out to the following facilities:   [facility]  [address], [place] [address] [phone]  [place]  [address] [place] [address] [phone] [facility]  [place], [place] [address] [phone] [place]  [place], [place] [address] [phone]  [facility]  [address] [place] [address] [place]  [facility]  [address], [place] [address] [phone] [place] [address] [place] [address] [phone]  [facility]  [address], [place] [address] [phone] [place]  [address] [place] [address] [phone] [place]  [address], [place] [address] [phone]  [facility]  [place], [place] [address] [phone]  [place] [address] [place] [address] [phone]  [facility] [place]  [place] [address] [phone]  [place], [place] [address] [phone] [facility]  [place], [place] [address] [phone] [facility]  [address], [place] [address] [phone] [facility]  [place], [place] [address] [phone]   [facility]  [address] [place] [address] [phone]  [facility]  [address] [place] [address] [phone] [place] [address] [place] [address] [phone] [place]  [address] [place] [address] [phone] [facility]  [address] [place] [address] [phone] [place] [place] [address] [phone]   Mariea Clonts, MSW, LCSW-A  9:06 PM 12/01/2021

## 2021-12-01 NOTE — Progress Notes (Signed)
Did not want to walk in hall but did walk around in room in addition to trips to bathroom

## 2021-12-02 DIAGNOSIS — J9612 Chronic respiratory failure with hypercapnia: Secondary | ICD-10-CM | POA: Diagnosis not present

## 2021-12-02 DIAGNOSIS — I4891 Unspecified atrial fibrillation: Secondary | ICD-10-CM | POA: Diagnosis not present

## 2021-12-02 DIAGNOSIS — T50902D Poisoning by unspecified drugs, medicaments and biological substances, intentional self-harm, subsequent encounter: Secondary | ICD-10-CM | POA: Diagnosis not present

## 2021-12-02 DIAGNOSIS — J9611 Chronic respiratory failure with hypoxia: Secondary | ICD-10-CM | POA: Diagnosis not present

## 2021-12-02 NOTE — TOC Progression Note (Signed)
Transition of Care [facility]) - Progression Note    Patient Details  Name: Tina Bailey MRN: [id number] Date of Birth: 1945-12-02  Transition of Care [facility]) CM/SW Contact  Ihor Gully, LCSW Phone Number: 12/02/2021, 11:12 AM  Clinical Narrative:    Spoke with patient's daughter, Mrs. Charlesetta Garibaldi, who verbalized frustration with lack of communication and the amount of time it is taking to find [facility] placement. She discussed that a trigger to patient's depression is being in [place] and that she is considering patient moving to [place] to be near her son's family. Mrs. Charlesetta Garibaldi is also concerned that patient will have a large bill while she is being here, medically stable, awaiting placement.       Barriers to Discharge: Psych Bed not available  Expected Discharge Plan and Services                                                 Social Determinants of Health (SDOH) Interventions Depression Interventions/Treatment : Referral to Psychiatry, Medication, Counseling  Readmission Risk Interventions     No data to display

## 2021-12-02 NOTE — Progress Notes (Signed)
PROGRESS NOTE  Tina Bailey:[id number] DOB: December 14, 1945 DOA: 11/22/2021 PCP: Vilinda Boehringer, NP  Brief History:  76 year old female with a history of COPD, hepatocellular carcinoma, opioid dependence with chronic abdominal and chronic back pain, depression, tobacco abuse, hypertension, chronic respiratory failure on 2 L presenting with suicide attempt.  The patient presented to the ED on 11/22/2021 after taking 28 Excedrin on the evening of 11/21/2021.  She began having nausea and vomiting throughout the night from 11/21/2021 to 11/22/2021.  She notified her daughter on the morning of 11/22/2021 and she was brought to the emergency department for further evaluation.  [facility]  The patient was evaluated by psychiatry who recommended inpatient evaluation and treatment.  While waiting for an inpatient Geri psychiatric bed, the patient developed atrial fibrillation with RVR on the morning of 11/24/2021 with heart rate in the 170s.  She was given a diltiazem bolus, 5 mg and then started on a drip.  Admission was requested. The patient denies any fever, chills, headache, chest pain, shortness breath, vomiting, diarrhea.  She has chronic nausea.  She has not had a bowel movement.  There is no hematochezia or melena.  She denies any coughing or hemoptysis. In the ED, the patient was afebrile hemodynamically stable albeit with soft blood pressures after starting diltiazem.  Oxygen saturation is 96% on 2 L.  WBC 7.1, hemoglobin 12.3, platelets 228,000.  BMP showed sodium 137, potassium 3.9, bicarbonate 27, serum creatinine 0.64.  EKG showed atrial fibrillation with nonspecific ST-T wave change.  Urine drug screen was positive for opiates.  Cardiology was consulted to assist with management.  Fortunately, patient spontaneously converted to sinus in late morning 11/24/21 and she remained in sinus thereafter.  Cardiology changed her regimen to metoprolol 25 mg bid and midodrine was  stopped.  Patient remained stable without new afib.  Metoprolol was decreased to 12.5 mg bid and HR remained controlled and pt remained hemodynamically stable.  Prolonged hospitalization due to difficulty finding available [facility] facility. She is cleared medically to go to [facility] facility.     Assessment and Plan: Depression with anxiety Currently still with suicidal ideation.  No plans. [facility] telepsychiatry to follow>>recommended d/c to inpatient [facility] facility. Continue fluoxetine, mirtazapine 9/11--had a little agitation today>>gave one time dose ativan 9/12--no further agitation.  Patient is hemodynamically stable to discharge to psychiatric facility when bed available. -Continue supportive care and consult reorientation.   Lumbar compression fracture (HCC) CT abd--L5 compression deformity is new since 12/11/2018 loss of approximately 25% of the central vertebral body height. >>TLSO brace  Dysuria CT renal--Bilateral nephrolithiasis. No signs of hydronephrosis, hydroureter or ureteral lithiasis UA--no pyuria  Impaired glucose tolerance 05/24/21 A1C--6.2 CBG controlled during hospitalization Outpatient PCP follow up  Essential hypertension Stopped amlodipine and spironolactone to allow BP margin to titrate metoprolol --BP controlled  Opioid dependence ([place]) PDMP reviewed --norco 10/325 #120 monthly --continue norco  Atrial fibrillation with RVR ([place]) CHADSVASc = 5 Continue diltiazem drip>>stopped after patient spontaneously converted to sinus in late morning 11/24/21 Cardiology consulted>>initially placed on metoprolol tid>>bid Now back in sinus with PACs 11/24/21 Echo EF 60-65%, G1DD, mild TR/MR, RVSP 40.7 TSH--0.575 Pt remained in sinus and hemodynamically stable and was cleared for d/c by cardiology Patient refuses anticoagulation after explanation of risks/benefits/alternatives -9/8 metoprolol decreased to 12.5 mg bid due to soft BP --episode may have been triggered by  patient's excess caffeine intake and from Excedrin --9/12 remains  in sinus, hemodynamically stable-no further afib  Chronic respiratory failure with hypoxia and hypercapnia (HCC) Chronically on 2L stable  Tobacco abuse Tobacco cessation discussed  COPD (chronic obstructive pulmonary disease) ([place]) Continue Breztri  Hepatocellular carcinoma ([place]) Currently under care of Dr. Burr Medico at [facility] --over 5 yrs from diagnosis --sp partical hepatectomy --under observation, not on active tx   Family Communication:  daughter updated 9/14   Consultants:  telepsychiatry, cardiology   Code Status:  DNR   DVT Prophylaxis:  [place] Lovenox     Procedures: As Listed in Progress Note Above   Antibiotics: None     Subjective: No overnight events.  With good oxygen saturation on chronic 2 L supplementation.  No chest pain, no nausea, no vomiting, no shortness of breath.  Expressing suicidal thoughts and demonstrating flat affect.  Patient also expressing back pain.   Objective: Vitals:   12/01/21 2043 12/02/21 0542 12/02/21 0744 12/02/21 1359  BP: 123/68 112/70  120/70  Pulse: 63 61  (!) 59  Resp: '17 16  16  '$ Temp: 98.2 F (36.8 C) 98.4 F (36.9 C)  98.6 F (37 C)  TempSrc: Oral   Oral  SpO2: 99% 100% 100% 100%  Weight:      Height:        Intake/Output Summary (Last 24 hours) at 12/02/2021 1540 Last data filed at 12/02/2021 1441 Gross per 24 hour  Intake 720 ml  Output 1400 ml  Net -680 ml   Weight change:  Exam: General exam: Alert, awake, oriented x 3; following commands appropriately.  Flat affect and poor interaction appreciated.  Per reports still with positive suicidal thoughts at this moment. Respiratory system: Clear to auscultation. Respiratory effort normal.  No using accessory muscle.  Good saturation on chronic supplementation.  (Nasal cannula in place). Cardiovascular system: Rate controlled, no rubs, no gallops, no JVD. Gastrointestinal system: Abdomen  is nondistended, soft and nontender. No organomegaly or masses felt. Normal bowel sounds heard. Central nervous system: No focal neurological deficits. Extremities: No cyanosis or clubbing. Skin: No rashes, lesions or ulcers Psychiatry: Judgement and insight appear normal.  Flat affect.   Data Reviewed: I have personally reviewed following labs and imaging studies  Basic Metabolic Panel: No results for input(s): "NA", "K", "CL", "CO2", "GLUCOSE", "BUN", "CREATININE", "CALCIUM", "MG", "PHOS" in the last 168 hours.  Urine analysis:    Component Value Date/Time   COLORURINE YELLOW 11/28/2021 [place] 11/28/2021 1119   LABSPEC 1.010 11/28/2021 1119   PHURINE 6.0 11/28/2021 1119   GLUCOSEU NEGATIVE 11/28/2021 1119   HGBUR NEGATIVE 11/28/2021 1119   BILIRUBINUR NEGATIVE 11/28/2021 1119   BILIRUBINUR negative 05/07/2021 1618   BILIRUBINUR negative 05/15/2018 0859   KETONESUR 5 (A) 11/28/2021 1119   PROTEINUR NEGATIVE 11/28/2021 1119   UROBILINOGEN 0.2 05/07/2021 1618   NITRITE NEGATIVE 11/28/2021 1119   LEUKOCYTESUR NEGATIVE 11/28/2021 1119   Sepsis Labs: '@LABRCNTIP'$ (procalcitonin:4,lacticidven:4) ) Recent Results (from the past 240 hour(s))  SARS Coronavirus 2 by RT PCR (hospital order, performed in [place] hospital lab) *cepheid single result test* Anterior Nasal Swab     Status: None   Collection Time: 11/22/21  4:26 PM   Specimen: Anterior Nasal Swab  Result Value Ref Range Status   SARS Coronavirus 2 by RT PCR NEGATIVE NEGATIVE Final    Comment: (NOTE) SARS-CoV-2 target nucleic acids are NOT DETECTED.  The SARS-CoV-2 RNA is generally detectable in upper and lower respiratory specimens during the acute phase of infection. The  lowest concentration of SARS-CoV-2 viral copies this assay can detect is 250 copies / mL. A negative result does not preclude SARS-CoV-2 infection and should not be used as the sole basis for treatment or other patient management  decisions.  A negative result may occur with improper specimen collection / handling, submission of specimen other than nasopharyngeal swab, presence of viral mutation(s) within the areas targeted by this assay, and inadequate number of viral copies (<250 copies / mL). A negative result must be combined with clinical observations, patient history, and epidemiological information.  Fact Sheet for Patients:   https://www.patel.info/  Fact Sheet for Healthcare Providers: https://hall.com/  This test is not yet approved or  cleared by the [place] FDA and has been authorized for detection and/or diagnosis of SARS-CoV-2 by FDA under an Emergency Use Authorization (EUA).  This EUA will remain in effect (meaning this test can be used) for the duration of the COVID-19 declaration under Section 564(b)(1) of the Act, 21 U.S.C. section 360bbb-3(b)(1), unless the authorization is terminated or revoked sooner.  Performed at [facility], [address], [place], [place] [address]   MRSA Next Gen by PCR, Nasal     Status: None   Collection Time: 11/24/21 11:55 AM   Specimen: Nasal Mucosa; Nasal Swab  Result Value Ref Range Status   MRSA by PCR Next Gen NOT DETECTED NOT DETECTED Final    Comment: (NOTE) The GeneXpert MRSA Assay (FDA approved for NASAL specimens only), is one component of a comprehensive MRSA colonization surveillance program. It is not intended to diagnose MRSA infection nor to guide or monitor treatment for MRSA infections. Test performance is not FDA approved in patients less than 3 years old. Performed at [facility], [address] [place], [place] [address]   Urine Culture     Status: Abnormal   Collection Time: 11/28/21 11:19 AM   Specimen: Urine, Clean Catch  Result Value Ref Range Status   Specimen Description   Final    URINE, CLEAN CATCH Performed at [facility], [address] [place], [place] [address]     Special Requests   Final    NONE Performed at [facility], [address] [place],  [address]    Culture (A)  Final    <10,000 COLONIES/mL INSIGNIFICANT GROWTH Performed at [facility], [place] [address] [place],  [address]    Report Status 11/30/2021 FINAL  Final     Scheduled Meds:  cholecalciferol  2,000 Units Oral QPC lunch   docusate sodium  100 mg Oral BID   enoxaparin (LOVENOX) injection  40 mg Subcutaneous Q24H   FLUoxetine  20 mg Oral Daily   gabapentin  300 mg Oral QHS   metoprolol tartrate  12.5 mg Oral BID   mirtazapine  15 mg Oral QHS   mometasone-formoterol  2 puff Inhalation BID   multivitamin with minerals  1 tablet Oral Daily   pantoprazole  40 mg Oral Daily   polyethylene glycol  17 g Oral Daily   risperiDONE  0.25 mg Oral [id number]   simethicone  80 mg Oral QID   Continuous Infusions:  Procedures/Studies:  Barton Dubois, MD  [facility]  If 7PM-7AM, please contact night-coverage [email or web address] Password [email or web address] 12/02/2021, 3:40 PM   LOS: 8 days

## 2021-12-03 DIAGNOSIS — C220 Liver cell carcinoma: Secondary | ICD-10-CM | POA: Diagnosis not present

## 2021-12-03 DIAGNOSIS — T50902D Poisoning by unspecified drugs, medicaments and biological substances, intentional self-harm, subsequent encounter: Secondary | ICD-10-CM | POA: Diagnosis not present

## 2021-12-03 DIAGNOSIS — J9611 Chronic respiratory failure with hypoxia: Secondary | ICD-10-CM | POA: Diagnosis not present

## 2021-12-03 DIAGNOSIS — I4891 Unspecified atrial fibrillation: Secondary | ICD-10-CM | POA: Diagnosis not present

## 2021-12-03 NOTE — Assessment & Plan Note (Signed)
-   Evaluated by psychiatry service with recommendation for inpatient psychiatry placement. -Continue to follow psychiatry service recommendation for further adjustment into current regimen to stabilize mood. -Continue Remeron, Prozac and as needed Klonopin.

## 2021-12-03 NOTE — Care Management Important Message (Signed)
Important Message  Patient Details  Name: Tina Bailey MRN: [id number] Date of Birth: 28-Jun-1945   Medicare Important Message Given:  Yes     Tommy Medal 12/03/2021, 11:43 AM

## 2021-12-03 NOTE — Progress Notes (Signed)
PROGRESS NOTE  Tina Bailey QMG:[id number] DOB: November 02, 1945 DOA: 11/22/2021 PCP: Vilinda Boehringer, NP  Brief History:  76 year old female with a history of COPD, hepatocellular carcinoma, opioid dependence with chronic abdominal and chronic back pain, depression, tobacco abuse, hypertension, chronic respiratory failure on 2 L presenting with suicide attempt.  The patient presented to the ED on 11/22/2021 after taking 28 Excedrin on the evening of 11/21/2021.  She began having nausea and vomiting throughout the night from 11/21/2021 to 11/22/2021.  She notified her daughter on the morning of 11/22/2021 and she was brought to the emergency department for further evaluation.  [facility]  The patient was evaluated by psychiatry who recommended inpatient evaluation and treatment.  While waiting for an inpatient Geri psychiatric bed, the patient developed atrial fibrillation with RVR on the morning of 11/24/2021 with heart rate in the 170s.  She was given a diltiazem bolus, 5 mg and then started on a drip.  Admission was requested. The patient denies any fever, chills, headache, chest pain, shortness breath, vomiting, diarrhea.  She has chronic nausea.  She has not had a bowel movement.  There is no hematochezia or melena.  She denies any coughing or hemoptysis. In the ED, the patient was afebrile hemodynamically stable albeit with soft blood pressures after starting diltiazem.  Oxygen saturation is 96% on 2 L.  WBC 7.1, hemoglobin 12.3, platelets 228,000.  BMP showed sodium 137, potassium 3.9, bicarbonate 27, serum creatinine 0.64.  EKG showed atrial fibrillation with nonspecific ST-T wave change.  Urine drug screen was positive for opiates.  Cardiology was consulted to assist with management.  Fortunately, patient spontaneously converted to sinus in late morning 11/24/21 and she remained in sinus thereafter.  Cardiology changed her regimen to metoprolol 25 mg bid and midodrine was  stopped.  Patient remained stable without new afib.  Metoprolol was decreased to 12.5 mg bid and HR remained controlled and pt remained hemodynamically stable.  Prolonged hospitalization due to difficulty finding available [facility] facility. She is cleared medically to go to [facility] facility.     Assessment and Plan: * Intentional overdose ([place]) - Evaluated by psychiatry service with recommendation for inpatient psychiatry placement. -Continue sitter.  Depression with anxiety Currently still demonstrating very depressed mood and flat affect.  Expressing no suicidal thoughts today. [facility] telepsychiatry to follow>>recommended d/c to inpatient [facility] facility. Continue fluoxetine, mirtazapine 9/11--had a little agitation today>>gave one time dose ativan 9/12--no further agitation.  Patient is hemodynamically stable to discharge to psychiatric facility when bed available. -Continue supportive care and consult reorientation.   Lumbar compression fracture (HCC) CT abd--L5 compression deformity is new since 12/11/2018 loss of approximately 25% of the central vertebral body height. >>TLSO brace  Dysuria CT renal--Bilateral nephrolithiasis. No signs of hydronephrosis, hydroureter or ureteral lithiasis UA--no pyuria  Impaired glucose tolerance 05/24/21 A1C--6.2 CBG controlled during hospitalization Outpatient PCP follow up  Essential hypertension Stopped amlodipine and spironolactone to allow BP margin to titrate metoprolol --BP controlled  Opioid dependence ([place]) PDMP reviewed --norco 10/325 #120 monthly --continue norco  Atrial fibrillation with RVR ([place]) CHADSVASc = 5 Continue diltiazem drip>>stopped after patient spontaneously converted to sinus in late morning 11/24/21 Cardiology consulted>>initially placed on metoprolol tid>>bid Now back in sinus with PACs 11/24/21 Echo EF 60-65%, G1DD, mild TR/MR, RVSP 40.7 TSH--0.575 Pt remained in sinus and hemodynamically stable and was cleared for  d/c by cardiology Patient refuses anticoagulation after explanation of risks/benefits/alternatives -9/8  metoprolol decreased to 12.5 mg bid due to soft BP --episode may have been triggered by patient's excess caffeine intake and from Excedrin --9/12 remains in sinus, hemodynamically stable-no further afib  Chronic respiratory failure with hypoxia and hypercapnia (HCC) Chronically on 2L stable  Tobacco abuse Tobacco cessation discussed  COPD (chronic obstructive pulmonary disease) ([place]) Continue Breztri  Hepatocellular carcinoma ([place]) Currently under care of Dr. Burr Medico at [facility] --over 5 yrs from diagnosis --sp partical hepatectomy --under observation, not on active tx   Family Communication:  daughter updated 9/14   Consultants:  telepsychiatry, cardiology   Code Status:  DNR   DVT Prophylaxis:  [place] Lovenox     Procedures: As Listed in Progress Note Above   Antibiotics: None     Subjective: Reporting back pain, no chest pain, no nausea, no vomiting, no shortness of breath.  Patient expressed no palpitations and demonstrating good saturation on chronic supplementation.  Today during visit by her daughter she expressed no suicidal thoughts.  Continue to demonstrate very depressed mood and flat affect.   Objective: Vitals:   12/02/21 2108 12/03/21 0549 12/03/21 0719 12/03/21 1355  BP:  (!) 105/46  (!) 119/59  Pulse:  (!) 53  62  Resp:  16  18  Temp:  (!) 97.5 F (36.4 C)  98.4 F (36.9 C)  TempSrc:  Oral  Oral  SpO2: 96% 100% 100% 100%  Weight:      Height:        Intake/Output Summary (Last 24 hours) at 12/03/2021 1757 Last data filed at 12/03/2021 1358 Gross per 24 hour  Intake 600 ml  Output 700 ml  Net -100 ml   Weight change:  Exam: General exam: Alert, awake, oriented x 3; able to move her bowels and overall feeling better.  Today while her daughter was visiting she denies suicidal thoughts. Respiratory system: Clear to auscultation.  Respiratory effort normal.  Good saturation on chronic supplementation. Cardiovascular system: Rate controlled, no rubs, no gallops, no JVD. Gastrointestinal system: Abdomen is nondistended, soft and nontender. No organomegaly or masses felt. Normal bowel sounds heard. Central nervous system: Alert and oriented. No focal neurological deficits. Extremities: No cyanosis or clubbing. Skin: No petechiae. Psychiatry: Judgement and insight appear normal.  Continues to demonstrate very depressed mood and flat affect.  Patient also expresses intermittent episodes of crying spells.  Data Reviewed: I have personally reviewed following labs and imaging studies  Basic Metabolic Panel: No results for input(s): "NA", "K", "CL", "CO2", "GLUCOSE", "BUN", "CREATININE", "CALCIUM", "MG", "PHOS" in the last 168 hours.  Urine analysis:    Component Value Date/Time   COLORURINE YELLOW 11/28/2021 1119   APPEARANCEUR CLEAR 11/28/2021 1119   LABSPEC 1.010 11/28/2021 1119   PHURINE 6.0 11/28/2021 1119   GLUCOSEU NEGATIVE 11/28/2021 1119   HGBUR NEGATIVE 11/28/2021 1119   BILIRUBINUR NEGATIVE 11/28/2021 1119   BILIRUBINUR negative 05/07/2021 1618   BILIRUBINUR negative 05/15/2018 0859   KETONESUR 5 (A) 11/28/2021 1119   PROTEINUR NEGATIVE 11/28/2021 1119   UROBILINOGEN 0.2 05/07/2021 1618   NITRITE NEGATIVE 11/28/2021 1119   LEUKOCYTESUR NEGATIVE 11/28/2021 1119   Sepsis Labs: '@LABRCNTIP'$ (procalcitonin:4,lacticidven:4) ) Recent Results (from the past 240 hour(s))  MRSA Next Gen by PCR, Nasal     Status: None   Collection Time: 11/24/21 11:55 AM   Specimen: Nasal Mucosa; Nasal Swab  Result Value Ref Range Status   MRSA by PCR Next Gen NOT DETECTED NOT DETECTED Final    Comment: (NOTE) The GeneXpert MRSA  Assay (FDA approved for NASAL specimens only), is one component of a comprehensive MRSA colonization surveillance program. It is not intended to diagnose MRSA infection nor to guide or monitor  treatment for MRSA infections. Test performance is not FDA approved in patients less than 27 years old. Performed at [facility], [address] [place], [place] [address]   Urine Culture     Status: Abnormal   Collection Time: 11/28/21 11:19 AM   Specimen: Urine, Clean Catch  Result Value Ref Range Status   Specimen Description   Final    URINE, CLEAN CATCH Performed at [facility], [address], [place], [place] [address]    Special Requests   Final    NONE Performed at [facility], [address] [place], [place] [address]    Culture (A)  Final    <10,000 COLONIES/mL INSIGNIFICANT GROWTH Performed at [facility], [place] [address], [place], [place] [address]    Report Status 11/30/2021 FINAL  Final     Scheduled Meds:  cholecalciferol  2,000 Units Oral QPC lunch   docusate sodium  100 mg Oral BID   enoxaparin (LOVENOX) injection  40 mg Subcutaneous Q24H   FLUoxetine  20 mg Oral Daily   gabapentin  300 mg Oral QHS   metoprolol tartrate  12.5 mg Oral BID   mirtazapine  15 mg Oral QHS   mometasone-formoterol  2 puff Inhalation BID   multivitamin with minerals  1 tablet Oral Daily   pantoprazole  40 mg Oral Daily   polyethylene glycol  17 g Oral Daily   risperiDONE  0.25 mg Oral [id number]   simethicone  80 mg Oral QID   Continuous Infusions:  Procedures/Studies:  Barton Dubois, MD  [facility]  If 7PM-7AM, please contact night-coverage [email or web address] Password [email or web address] 12/03/2021, 5:57 PM   LOS: 9 days

## 2021-12-03 NOTE — Progress Notes (Addendum)
This CSW spoke with Tina Bailey at [facility]. Tina Bailey shares that she works night shift and that generally during the day this line/ Intake transfers to voicemail and if there is availability then a call will be made to the referral source. CSW spends time speaking with Tina Bailey about case to see if case can be reviewed. Tina Bailey informs this CSW that there is no open beds for this pt at this time, however shared that pt will go on a running waitlist and will be reviewed. Tina Bailey informs that their team has worked with this pt before and aware of her medical needs; advising that pt would be considered an high acuity referral.  CSW confirms CSW's phone number and [facility]'s ED number. Tina Bailey advised CSW to send referral daily. CSW will continue to seek placement for this pt.     Benjaman Kindler, MSW, LCSWA 12/03/2021 1:56 AM

## 2021-12-03 NOTE — Consult Note (Cosign Needed Addendum)
Telepsych Consultation   Reason for Consult:  suicide attempt Referring Physician:  Hayden Rasmussen MD Location of Patient:  A311-01 Location of Provider: [place] Department  Patient Identification: Tina Bailey MRN:  [id number] Principal Diagnosis: Intentional overdose [facility]) Diagnosis:  Principal Problem:   Intentional overdose ([place]) Active Problems:   Hepatocellular carcinoma ([place])   Depression with anxiety   COPD (chronic obstructive pulmonary disease) (HCC)   Tobacco abuse   Severe episode of recurrent major depressive disorder, without psychotic features ([place])   Chronic respiratory failure with hypoxia and hypercapnia (HCC)   Atrial fibrillation with RVR ([place])   Opioid dependence ([place])   Essential hypertension   Impaired glucose tolerance   Dysuria   Lumbar compression fracture ([place])   Suicidal ideation   Total Time spent with patient: 30 minutes  Subjective:   Tina Bailey is a 76 y.o. female patient admitted with suicide attempt.  Patient alert and oriented. Remembered providers name from week prior. Talkative. "Not that great. I got a little concerned when you called, I don't want another interview now. I'm getting a little worried. I've been here too long. It was a very stupid thing to do. I hurt my family, I hurt myself. It as a very stupid thing to do. It didn't work so God has something planned for me. I have to keep on going, I don't want to hurt my family."  When asked about future plans for suicide patient laughed and began repeating prior sentiments not answering the question. Provided permission to speak with daughter. Denies any auditory or visual hallucinations.   Collateral: [name] (daughter) [phone] I think she's trying to find the brighter side of things. I think definitely think she wants out. I'm very disappointed with the behavioral services with [facility] at this time.  Expressed concerned/frustrated that she's  being held in a hospital after being medically cleared and not placed in psychiatric facility. Upset that [facility] accepted pt 11/26/21 and reports calling x2 days and no one answered; pt lost bed as a result. Says pt is unable to return to prior place and medications have not been adjusted. States family is looking for long-term placement on the [employer] but expressed concern no one is communicating with family or following up. Feels pt is saying what she needs to say to get out because she's tired of being in the hospital. Doesn't feel mother is safe for discharge given history. Concerned of extended stay in hospital for patient's mental health and financial obligation. Patient is a [place] veteran.    HPI:   HPI:  Tina Bailey is a 76 year old female with past psychiatric history of depression with anxiety, opioid dependence, severe episode of recurrent major depressive disorder without psychotic features who presented to [facility] after intentionally ingesting 24 Excedrin pills in a suicide attempt. Per chart review patient noted to c/o of being "in a lot of pain and wants to die"; vomitus noted in her trashcan. Patient currently lives at [facility]. On admission patient noted to have prolonged QTC, elevated K and Mg requiring cardiac monitoring, IV NAC, and supportive care. She was cleared by Poison Control and later by EDP. Of note pt observed sticking her finger down her throat in attempt to induce vomit. Per chart review pt presented to [facility]04[facility]21 with "suicidal behavior" where she was transferred to [facility] gero-unit for inpatient,  [place] 07/25/21 for "suicidal ideation" where she was transferred to [facility] for gero-psychiatric  admission. Acitve anti-depressants and anti-psychotropics. UDS+opiates, BAL<10. Last salicylate 70/48/88 91.6. PDMP reviewed, Clonazepam 0.5 mg (45 tabs) filled 10/20/21, Hydrocodone 10-'325mg'$  (120 tabs)  filled 10/27/21, Gabapentin 300 mg (3 tabs) filled  11/13/21.  Past Psychiatric History: depression with anxiety, opioid dependence, severe episode of recurrent major depressive disorder without psychotic features  Risk to Self:   Risk to Others:   Prior Inpatient Therapy:   Prior Outpatient Therapy:    Past Medical History:  Past Medical History:  Diagnosis Date   Arthritis    DDD. Right shoulder"is frozen"-limited ROM. osteoporosis.   Blue toe syndrome ([place]) 11/27/2014   Dr. Claudia Pollock evaluating   Chronic diastolic CHF (congestive heart failure) (HCC)    Chronic respiratory failure (HCC)    Complication of anesthesia    COPD (chronic obstructive pulmonary disease) ([place])    Fibromyalgia    Fracture of rib of right side    hx "osteoporosis"- states her dog nudge her on the side, next day developed great pain and was told has a fracture rib.   Liver cancer ([place]) 12/2014   Macular degeneration    R eye   Osteoporosis    PONV (postoperative nausea and vomiting)    nausea,severe vomiting after 01-13-15 portal vein embolization   Productive cough    Retina disorder    L eye, vision distorted, edema   Right atrial mass    Spine fracture due to birth trauma    Thoracic aortic aneurysm (TAA) ([place])    TMJ disease    Wears glasses     Past Surgical History:  Procedure Laterality Date   ANGIOPLASTY  2008   no stents required, no follow-up with cardiologist, no recurrent chest pain   CATARACT EXTRACTION, BILATERAL Bilateral    ESOPHAGOGASTRODUODENOSCOPY (EGD) WITH PROPOFOL N/A 06/25/2015   Procedure: ESOPHAGOGASTRODUODENOSCOPY (EGD) WITH PROPOFOL;  Surgeon: Milus Banister, MD;  Location: [facility];  Service: Endoscopy;  Laterality: N/A;  stent removal    EYE SURGERY     cornea surgery   INTRAMEDULLARY (IM) NAIL INTERTROCHANTERIC Right 12/04/2017   Procedure: INTRAMEDULLARY (IM) NAIL INTERTROCHANTRIC;  Surgeon: Rod Can, MD;  Location: [facility];  Service: Orthopedics;  Laterality: Right;   IR RADIOLOGIST EVAL & MGMT   03/29/2017   KIDNEY STONE SURGERY     LAPAROSCOPIC PARTIAL HEPATECTOMY N/A 03/04/2015   Procedure: DIAGNOSTIC LAPAROSCOPY, EXTENDED RIGHT HEPATECTOMY, WITH INTRAOPERATIVE ULTRASOUND;  Surgeon: Stark Klein, MD;  Location: [facility];  Service: General;  Laterality: N/A;   PARTIAL HYSTERECTOMY     portal vein embolization     01-13-15 -Dr. Cathlean Sauer.   Family History:  Family History  Problem Relation Age of Onset   Hypertension Mother    Atrial fibrillation Mother    Heart disease Mother        after age 58   Breast cancer Mother    Stroke Father    Dementia Father    Peptic Ulcer Father    Heart disease Brother        After age 31- A-Fib   Heart attack Brother    Family Psychiatric  History: not noted Social History:  Social History   Substance and Sexual Activity  Alcohol Use No   Alcohol/week: 0.0 standard drinks of alcohol     Social History   Substance and Sexual Activity  Drug Use No    Social History   Socioeconomic History   Marital status: Divorced    Spouse name: Not on file   Number  of children: 3   Years of education: Not on file   Highest education level: Not on file  Occupational History   Occupation: retired  Tobacco Use   Smoking status: Former    Packs/day: 1.00    Years: 45.00    Total pack years: 45.00    Types: Cigarettes    Quit date: 02/20/2018    Years since quitting: 3.7   Smokeless tobacco: Never  Vaping Use   Vaping Use: Never used  Substance and Sexual Activity   Alcohol use: No    Alcohol/week: 0.0 standard drinks of alcohol   Drug use: No   Sexual activity: Not Currently  Other Topics Concern   Not on file  Social History Narrative   Not on file   Social Determinants of Health   Financial Resource Strain: Not on file  Food Insecurity: No Food Insecurity (11/24/2021)   Hunger Vital Sign    Worried About Running Out of Food in the Last Year: Never true    [place] in the Last Year: Never true  Transportation  Needs: No Transportation Needs (11/24/2021)   PRAPARE - Hydrologist (Medical): No    Lack of Transportation (Non-Medical): No  Physical Activity: Not on file  Stress: Not on file  Social Connections: Not on file   Additional Social History:    Allergies:   Allergies  Allergen Reactions   Zoloft [Sertraline Hcl] Other (See Comments)    Zoloft, had a terrible reaction   Amoxicillin-Pot Clavulanate Nausea Only    Amoxacillin Augmentin combo  Has patient had a PCN reaction causing immediate rash, facial/tongue/throat swelling, SOB or lightheadedness with hypotension: No Has patient had a PCN reaction causing severe rash involving mucus membranes or skin necrosis: No Has patient had a PCN reaction that required hospitalization No Has patient had a PCN reaction occurring within the last 10 years: No If all of the above answers are "NO", then may proceed with Cephalosporin use.    Clindamycin/Lincomycin Other (See Comments)    Felt like it "burned out" her stomach/took a large dose   Paxil [Paroxetine Hcl] Rash   Sulfa Antibiotics Rash    Labs: No results found for this or any previous visit (from the past 48 hour(s)).  Medications:  Current Facility-Administered Medications  Medication Dose Route Frequency Provider Last Rate Last Admin   acetaminophen (TYLENOL) tablet 650 mg  650 mg Oral Q6H PRN Tat, [name], MD   650 mg at 11/29/21 1118   Or   acetaminophen (TYLENOL) suppository 650 mg  650 mg Rectal Q6H PRN Tat, David, MD       cholecalciferol (VITAMIN D3) 25 MCG (1000 UNIT) tablet 2,000 Units  2,000 Units Oral QPC lunch Hayden Rasmussen, MD   2,000 Units at 12/03/21 1416   docusate sodium (COLACE) capsule 100 mg  100 mg Oral BID Barton Dubois, MD   100 mg at 12/01/21 2144   enoxaparin (LOVENOX) injection 40 mg  40 mg Subcutaneous Q24H Orson Eva, MD   40 mg at 12/03/21 1415   FLUoxetine (PROZAC) capsule 20 mg  20 mg Oral Daily Hayden Rasmussen, MD    20 mg at 12/03/21 0842   gabapentin (NEURONTIN) capsule 300 mg  300 mg Oral QHS Hayden Rasmussen, MD   300 mg at 12/02/21 2056   HYDROcodone-acetaminophen (NORCO) 10-325 MG per tablet 1 tablet  1 tablet Oral Q4H PRN Orson Eva, MD   1 tablet  at 12/03/21 1807   ipratropium-albuterol (DUONEB) 0.5-2.5 (3) MG/3ML nebulizer solution 3 mL  3 mL Nebulization Q6H PRN Tat, David, MD       metoprolol tartrate (LOPRESSOR) tablet 12.5 mg  12.5 mg Oral BID Dunn, Dayna N, PA-C   12.5 mg at 12/02/21 2056   mirtazapine (REMERON) tablet 15 mg  15 mg Oral QHS Hayden Rasmussen, MD   15 mg at 12/02/21 2056   mometasone-formoterol (DULERA) 100-5 MCG/ACT inhaler 2 puff  2 puff Inhalation BID Hayden Rasmussen, MD   2 puff at 12/03/21 0719   multivitamin with minerals tablet 1 tablet  1 tablet Oral Daily Tat, [name], MD   1 tablet at 12/03/21 0842   ondansetron (ZOFRAN) tablet 4 mg  4 mg Oral Q6H PRN Orson Eva, MD   4 mg at 11/29/21 1113   Or   ondansetron (ZOFRAN) injection 4 mg  4 mg Intravenous Q6H PRN Tat, [name], MD       pantoprazole (PROTONIX) EC tablet 40 mg  40 mg Oral Daily Hayden Rasmussen, MD   40 mg at 12/03/21 1610   polyethylene glycol (MIRALAX / GLYCOLAX) packet 17 g  17 g Oral Daily Tat, [name], MD   17 g at 12/01/21 0841   risperiDONE (RISPERDAL) tablet 0.25 mg  0.25 mg Oral [id number] Hayden Rasmussen, MD   0.25 mg at 12/03/21 1417   simethicone (MYLICON) chewable tablet 80 mg  80 mg Oral QID Orson Eva, MD   80 mg at 12/01/21 1725    Musculoskeletal: Strength & Muscle Tone: within normal limits Gait & Station: normal Patient leans: N/A  Psychiatric Specialty Exam:  Presentation  General Appearance: Casual  Eye Contact:Fair  Speech:Clear and Coherent  Speech Volume:Normal  Handedness:No data recorded  Mood and Affect  Mood:Euthymic  Affect:Blunt   Thought Process  Thought Processes:Goal Directed  Descriptions of Associations:Tangential  Orientation:Full (Time, Place and  Person)  Thought Content:Logical  History of Schizophrenia/Schizoaffective disorder:No  Duration of Psychotic Symptoms:No data recorded Hallucinations:Hallucinations: None  Ideas of Reference:None  Suicidal Thoughts:Suicidal Thoughts: No  Homicidal Thoughts:Homicidal Thoughts: No   Sensorium  Memory:Recent Good; Immediate Fair; Remote Fair  Judgment:Poor  Insight:Shallow   Executive Functions  Concentration:Fair  Attention Span:Fair  Winger   Psychomotor Activity  Psychomotor Activity:Psychomotor Activity: Normal  Assets  Assets:Social Support; Resilience; Physical Health; Financial Resources/Insurance   Sleep  Sleep:Sleep: Good   Physical Exam: Physical Exam Vitals and nursing note reviewed.  HENT:     Head: Normocephalic.     Nose: Nose normal.     Mouth/Throat:     Pharynx: Oropharynx is clear.  Eyes:     Pupils: Pupils are equal, round, and reactive to light.  Cardiovascular:     Rate and Rhythm: Normal rate.     Pulses: Normal pulses.  Pulmonary:     Effort: Pulmonary effort is normal.  Abdominal:     Palpations: Abdomen is soft.  Musculoskeletal:     Cervical back: Normal range of motion.  Skin:    General: Skin is dry.  Neurological:     Mental Status: She is alert and oriented to person, place, and time.  Psychiatric:        Attention and Perception: Perception normal.        Mood and Affect: Affect is blunt.        Speech: Speech is tangential.        Behavior: Behavior is cooperative.  Thought Content: Thought content is not paranoid or delusional. Thought content does not include homicidal ideation. Thought content does not include homicidal or suicidal plan.        Judgment: Judgment is impulsive and inappropriate.    Review of Systems  Psychiatric/Behavioral:  Positive for depression.   All other systems reviewed and are negative.  Blood pressure (!) 119/59, pulse 62,  temperature 98.4 F (36.9 C), temperature source Oral, resp. rate 18, height 5' (1.524 m), weight 57 kg, SpO2 100 %. Body mass index is 24.54 kg/m.  Treatment Plan Summary: Daily contact with patient to assess and evaluate symptoms and progress in treatment, Medication management, and Plan   Of note provider contacted [facility] who reports bed availability [facility] admissions intake coordinator)[phone] fax. [place] contacted; no answer. SW to continue to seek inpatient placement.    Disposition: Recommend psychiatric Inpatient admission when medically cleared. Supportive therapy provided about ongoing stressors. Discussed crisis plan, support from social network, calling 911, coming to the Emergency Department, and calling Suicide Hotline.  This service was provided via telemedicine using a 2-way, interactive audio and video technology.  Names of all persons participating in this telemedicine service and their role in this encounter. Name: Oneida Alar Role: PMHNP  Name: Janine Limbo Role: Attending MD  Name: Tina Bailey Role: patient  Name: [name] Role: daughter    Inda Merlin, NP 12/03/2021 8:06 PM

## 2021-12-03 NOTE — Progress Notes (Signed)
Inpatient Behavioral Health Placement  Pt meets inpatient criteria per Garrison Columbus, NP.  Referral was sent to the following facilities;    Destination Service Provider Address Phone Fax  [facility]  [address] [place] [address] [phone]  [facility]  [address] [place] [address] [phone] [facility]  [place], [place] [address] [phone]  [facility]  [place], [place] [address] [phone]  [facility]  [address] [place] [address] [place]  [facility]  [address], [place] [address] [phone]  [address] [place] [address] [phone]  [facility]  [address] [place] [address] [phone] [place]  [address], [place] [address] [phone] [place]  [address] [place] [address] [phone]  [facility]iatric  [place], [place] [address] [phone]  [place] [address] [place] [address] [phone]  [facility] [place]  [place] [address] [phone]  [place], [place] [address] [phone] [facility]  [place], [place] [address] [phone] [facility]  [address] [place] [address] [phone] [facility]  [place], [place] [address] [phone]   [facility]  [address] [place] [address] [phone]  [facility]  [address] [place] [address] [phone] [place] [address], [place] [address] [phone] [place]  [address], [place] [address] [phone] [facility]  [address], [place] [address] [phone] [place]  [address], [place] [address] [phone] [place]  [place] [place] [address] [place]  [facility]  [address], [place] [address] [phone]   Situation ongoing,  CSW will follow up.   Benjaman Kindler, MSW, LCSWA 12/03/2021  @ 1:36 AM

## 2021-12-04 DIAGNOSIS — C220 Liver cell carcinoma: Secondary | ICD-10-CM | POA: Diagnosis not present

## 2021-12-04 DIAGNOSIS — I4891 Unspecified atrial fibrillation: Secondary | ICD-10-CM | POA: Diagnosis not present

## 2021-12-04 DIAGNOSIS — J9611 Chronic respiratory failure with hypoxia: Secondary | ICD-10-CM | POA: Diagnosis not present

## 2021-12-04 DIAGNOSIS — T50902D Poisoning by unspecified drugs, medicaments and biological substances, intentional self-harm, subsequent encounter: Secondary | ICD-10-CM | POA: Diagnosis not present

## 2021-12-04 LAB — SARS CORONAVIRUS 2 BY RT PCR: SARS Coronavirus 2 by RT PCR: NEGATIVE

## 2021-12-04 MED ORDER — SIMETHICONE 80 MG PO CHEW: 80.0000 mg | CHEWABLE_TABLET | Freq: Four times a day (QID) | ORAL | Status: DC | PRN

## 2021-12-04 MED ORDER — POLYETHYLENE GLYCOL 3350 17 G PO PACK: 17.0000 g | PACK | Freq: Every day | ORAL | Status: DC | PRN

## 2021-12-04 MED ORDER — DOCUSATE SODIUM 100 MG PO CAPS: 100.0000 mg | ORAL_CAPSULE | Freq: Two times a day (BID) | ORAL | Status: DC | PRN

## 2021-12-04 NOTE — Progress Notes (Signed)
PROGRESS NOTE  Tina Bailey ZSW:[id number] DOB: 01/14/1946 DOA: 11/22/2021 PCP: Vilinda Boehringer, NP  Brief History:  76 year old female with a history of COPD, hepatocellular carcinoma, opioid dependence with chronic abdominal and chronic back pain, depression, tobacco abuse, hypertension, chronic respiratory failure on 2 L presenting with suicide attempt.  The patient presented to the ED on 11/22/2021 after taking 28 Excedrin on the evening of 11/21/2021.  She began having nausea and vomiting throughout the night from 11/21/2021 to 11/22/2021.  She notified her daughter on the morning of 11/22/2021 and she was brought to the emergency department for further evaluation.  [facility]  The patient was evaluated by psychiatry who recommended inpatient evaluation and treatment.  While waiting for an inpatient Geri psychiatric bed, the patient developed atrial fibrillation with RVR on the morning of 11/24/2021 with heart rate in the 170s.  She was given a diltiazem bolus, 5 mg and then started on a drip.  Admission was requested. The patient denies any fever, chills, headache, chest pain, shortness breath, vomiting, diarrhea.  She has chronic nausea.  She has not had a bowel movement.  There is no hematochezia or melena.  She denies any coughing or hemoptysis. In the ED, the patient was afebrile hemodynamically stable albeit with soft blood pressures after starting diltiazem.  Oxygen saturation is 96% on 2 L.  WBC 7.1, hemoglobin 12.3, platelets 228,000.  BMP showed sodium 137, potassium 3.9, bicarbonate 27, serum creatinine 0.64.  EKG showed atrial fibrillation with nonspecific ST-T wave change.  Urine drug screen was positive for opiates.  Cardiology was consulted to assist with management.  Fortunately, patient spontaneously converted to sinus in late morning 11/24/21 and she remained in sinus thereafter.  Cardiology changed her regimen to metoprolol 25 mg bid and midodrine was  stopped.  Patient remained stable without new afib.  Metoprolol was decreased to 12.5 mg bid and HR remained controlled and pt remained hemodynamically stable.  Prolonged hospitalization due to difficulty finding available [facility] facility. She is cleared medically to go to [facility] facility.     Assessment and Plan: * Intentional overdose ([place]) - Evaluated by psychiatry service with recommendation for inpatient psychiatry placement. -Continue sitter.  Depression with anxiety -Continue demonstrating depression and flat affect. -no SI currently. -continue [facility] telepsychiatry to follow>>recommending d/c to inpatient [facility] facility. Currently tentatively accepted to [place] for Monday 12/06/21. -Continue fluoxetine, mirtazapine 9/11--had a little agitation today>>gave one time dose ativan 9/12--no further agitation.  Patient is hemodynamically stable to discharge to psychiatric facility when bed available. -Continue supportive care and constant reorientation.   Lumbar compression fracture (HCC) CT abd--L5 compression deformity is new since 12/11/2018 loss of approximately 25% of the central vertebral body height. >>TLSO brace  Dysuria CT renal--Bilateral nephrolithiasis. No signs of hydronephrosis, hydroureter or ureteral lithiasis UA--no pyuria  Impaired glucose tolerance 05/24/21 A1C--6.2 CBG controlled during hospitalization Outpatient PCP follow up  Essential hypertension Stopped amlodipine and spironolactone to allow BP margin to titrate metoprolol --BP controlled  Opioid dependence ([place]) PDMP reviewed --norco 10/325 #120 monthly --continue norco  Atrial fibrillation with RVR ([place]) CHADSVASc = 5 Continue diltiazem drip>>stopped after patient spontaneously converted to sinus in late morning 11/24/21 Cardiology consulted>>initially placed on metoprolol tid>>bid Now back in sinus with PACs 11/24/21 Echo EF 60-65%, G1DD, mild TR/MR, RVSP 40.7 TSH--0.575 Pt remained in sinus and  hemodynamically stable and was cleared for d/c by cardiology Patient refuses anticoagulation after  explanation of risks/benefits/alternatives -9/8 metoprolol decreased to 12.5 mg bid due to soft BP --episode may have been triggered by patient's excess caffeine intake and from Excedrin --9/12 remains in sinus, hemodynamically stable-no further afib  Chronic respiratory failure with hypoxia and hypercapnia (HCC) -Chronically on 2L -Stable  Tobacco abuse -Tobacco cessation discussed  COPD (chronic obstructive pulmonary disease) (HCC) -Continue Breztri -no wheezing -good saturation on chronic supplementation.  Hepatocellular carcinoma ([place]) Currently under care of Dr. Burr Medico at [facility] --over 5 yrs from diagnosis --sp partical hepatectomy --under observation, not on active tx   Family Communication:  no family at bedside.   Consultants:  telepsychiatry, cardiology   Code Status:  DNR   DVT Prophylaxis:  [place] Lovenox     Procedures: As Listed in Progress Note Above   Antibiotics: None     Subjective: Afebrile, no CP, no nausea, no vomiting. No SI currently. Continue demonstrating depressed mood and flat affect.   Objective: Vitals:   12/03/21 2019 12/03/21 2108 12/04/21 0510 12/04/21 0712  BP:  116/62 134/71   Pulse:  65 72   Resp:  18 14   Temp:  (!) 97.2 F (36.2 C) 97.6 F (36.4 C)   TempSrc:      SpO2: 97% 98% 92% 94%  Weight:      Height:        Intake/Output Summary (Last 24 hours) at 12/04/2021 0933 Last data filed at 12/04/2021 0724 Gross per 24 hour  Intake 840 ml  Output 450 ml  Net 390 ml   Weight change:   Exam: General exam: Alert, awake, oriented x 3; flat affect, depressed mood. No CP, no SOB, no nausea, no vomiting. Afebrile. Respiratory system: Clear to auscultation. Respiratory effort normal.good sat on chronic supplementation. Cardiovascular system:Rate controlled, no rubs, no gallops.  Gastrointestinal system: Abdomen is  nondistended, soft and nontender. No organomegaly or masses felt. Normal bowel sounds heard. Central nervous system: Alert and oriented. No focal neurological deficits. Extremities: No cyanosis, no clubbing. Skin: No petechiae. Psychiatry: Judgement and insight appear normal. Depressed mood, flat affect.  Data Reviewed: I have personally reviewed following labs and imaging studies  Urine analysis:    Component Value Date/Time   COLORURINE YELLOW 11/28/2021 [place] 11/28/2021 1119   LABSPEC 1.010 11/28/2021 1119   PHURINE 6.0 11/28/2021 1119   GLUCOSEU NEGATIVE 11/28/2021 1119   HGBUR NEGATIVE 11/28/2021 1119   BILIRUBINUR NEGATIVE 11/28/2021 1119   BILIRUBINUR negative 05/07/2021 1618   BILIRUBINUR negative 05/15/2018 0859   KETONESUR 5 (A) 11/28/2021 1119   PROTEINUR NEGATIVE 11/28/2021 1119   UROBILINOGEN 0.2 05/07/2021 1618   NITRITE NEGATIVE 11/28/2021 1119   LEUKOCYTESUR NEGATIVE 11/28/2021 1119   Sepsis Labs:  Recent Results (from the past 240 hour(s))  MRSA Next Gen by PCR, Nasal     Status: None   Collection Time: 11/24/21 11:55 AM   Specimen: Nasal Mucosa; Nasal Swab  Result Value Ref Range Status   MRSA by PCR Next Gen NOT DETECTED NOT DETECTED Final    Comment: (NOTE) The GeneXpert MRSA Assay (FDA approved for NASAL specimens only), is one component of a comprehensive MRSA colonization surveillance program. It is not intended to diagnose MRSA infection nor to guide or monitor treatment for MRSA infections. Test performance is not FDA approved in patients less than 11 years old. Performed at [facility], [address], [place], [place] [address]   Urine Culture     Status: Abnormal   Collection  Time: 11/28/21 11:19 AM   Specimen: Urine, Clean Catch  Result Value Ref Range Status   Specimen Description   Final    URINE, CLEAN CATCH Performed at [facility], [address] [place], [place] [address]    Special Requests   Final     NONE Performed at [facility], [address], [place], [place] [address]    Culture (A)  Final    <10,000 COLONIES/mL INSIGNIFICANT GROWTH Performed at [facility] [address], [place], [place] [address]    Report Status 11/30/2021 FINAL  Final     Scheduled Meds:  cholecalciferol  2,000 Units Oral QPC lunch   docusate sodium  100 mg Oral BID   enoxaparin (LOVENOX) injection  40 mg Subcutaneous Q24H   FLUoxetine  20 mg Oral Daily   gabapentin  300 mg Oral QHS   metoprolol tartrate  12.5 mg Oral BID   mirtazapine  15 mg Oral QHS   mometasone-formoterol  2 puff Inhalation BID   multivitamin with minerals  1 tablet Oral Daily   pantoprazole  40 mg Oral Daily   polyethylene glycol  17 g Oral Daily   risperiDONE  0.25 mg Oral [id number]   simethicone  80 mg Oral QID    Procedures/Studies:  Barton Dubois, MD  [facility]  If 7PM-7AM, please contact night-coverage [email or web address] Password [facility] 12/04/2021, 9:33 AM   LOS: 10 days

## 2021-12-04 NOTE — Progress Notes (Signed)
Per Dr. Louis Meckel, DO pt was accepted to [facility] Monday 12/06/21. This CSW will assist and follow with placement.     Benjaman Kindler, MSW, [facility] 12/04/2021 12:33 AM

## 2021-12-05 DIAGNOSIS — T50902D Poisoning by unspecified drugs, medicaments and biological substances, intentional self-harm, subsequent encounter: Secondary | ICD-10-CM | POA: Diagnosis not present

## 2021-12-05 DIAGNOSIS — I4891 Unspecified atrial fibrillation: Secondary | ICD-10-CM | POA: Diagnosis not present

## 2021-12-05 DIAGNOSIS — C220 Liver cell carcinoma: Secondary | ICD-10-CM | POA: Diagnosis not present

## 2021-12-05 DIAGNOSIS — J9611 Chronic respiratory failure with hypoxia: Secondary | ICD-10-CM | POA: Diagnosis not present

## 2021-12-05 MED ORDER — SALINE SPRAY 0.65 % NA SOLN: 1.0000 | NASAL | Status: DC | PRN

## 2021-12-05 MED ORDER — CLONAZEPAM 0.5 MG PO TABS
0.5000 mg | ORAL_TABLET | Freq: Two times a day (BID) | ORAL | Status: DC | PRN
  Administered 2021-12-05 – 2021-12-07 (×2): 0.5 mg via ORAL
  Filled 2021-12-05 (×2): qty 1

## 2021-12-05 NOTE — Consult Note (Signed)
  I spoke with patient's LPN, Rise Patience, who informed me the patient did not want to participate in assessment and is agreeable with transfer to [facility] tomorrow, however she would like for me to contact her daughter, [name], to update her on current plans.   Per note from [place], [place] "Per Dr. Louis Meckel, DO pt was accepted to [facility] Monday 12/06/21. This CSW will assist and follow with placement."  I spoke with [name] over the phone at [phone]. I informed her of patient being accepted to [facility] and will be transferred by the hospital tomorrow and she excited and agreeable to plan. She does want to mention she feels like her mother was in a lot of pain, more than usual, for two weeks prior to her suicide attempt. She does feel like her mom's chronic pain probably contributed to her trying to end her life. She is hoping her chronic pain can be better assessed for management while in the Hospital. She has no other questions or concerns at this time.   Will continue plan for IP treatment and transfer to [facility] tomorrow. Will not make any further psychotropic medication adjustments at this time.

## 2021-12-05 NOTE — Progress Notes (Signed)
Patient is asleep, reportedly had bad day by other RT, (angry). Will give inhaler later or allow to skip for tonight.

## 2021-12-05 NOTE — Progress Notes (Signed)
PROGRESS NOTE  Tina Bailey YQM:[id number] DOB: May 21, 1945 DOA: 11/22/2021 PCP: Vilinda Boehringer, NP  Brief History:  76 year old female with a history of COPD, hepatocellular carcinoma, opioid dependence with chronic abdominal and chronic back pain, depression, tobacco abuse, hypertension, chronic respiratory failure on 2 L presenting with suicide attempt.  The patient presented to the ED on 11/22/2021 after taking 28 Excedrin on the evening of 11/21/2021.  She began having nausea and vomiting throughout the night from 11/21/2021 to 11/22/2021.  She notified her daughter on the morning of 11/22/2021 and she was brought to the emergency department for further evaluation.  [facility]  The patient was evaluated by psychiatry who recommended inpatient evaluation and treatment.  While waiting for an inpatient Geri psychiatric bed, the patient developed atrial fibrillation with RVR on the morning of 11/24/2021 with heart rate in the 170s.  She was given a diltiazem bolus, 5 mg and then started on a drip.  Admission was requested. The patient denies any fever, chills, headache, chest pain, shortness breath, vomiting, diarrhea.  She has chronic nausea.  She has not had a bowel movement.  There is no hematochezia or melena.  She denies any coughing or hemoptysis. In the ED, the patient was afebrile hemodynamically stable albeit with soft blood pressures after starting diltiazem.  Oxygen saturation is 96% on 2 L.  WBC 7.1, hemoglobin 12.3, platelets 228,000.  BMP showed sodium 137, potassium 3.9, bicarbonate 27, serum creatinine 0.64.  EKG showed atrial fibrillation with nonspecific ST-T wave change.  Urine drug screen was positive for opiates.  Cardiology was consulted to assist with management.  Fortunately, patient spontaneously converted to sinus in late morning 11/24/21 and she remained in sinus thereafter.  Cardiology changed her regimen to metoprolol 25 mg bid and midodrine was  stopped.  Patient remained stable without new afib.  Metoprolol was decreased to 12.5 mg bid and HR remained controlled and pt remained hemodynamically stable.  Prolonged hospitalization due to difficulty finding available [facility] facility. She is cleared medically to go to [facility] facility.     Assessment and Plan: * Intentional overdose ([place]) - Evaluated by psychiatry service with recommendation for inpatient psychiatry placement. -Continue sitter.  Depression with anxiety -Continue demonstrating depression and flat affect. -no SI currently. -continue [facility] telepsychiatry to follow>>recommending d/c to inpatient [facility] facility. Currently tentatively accepted to [place] for Monday 12/06/21. -Continue fluoxetine, mirtazapine 9/11--had a little agitation today>>gave one time dose ativan 9/12--no further agitation.  Patient is hemodynamically stable to discharge to psychiatric facility when bed available. -Continue supportive care and constant reorientation.   Lumbar compression fracture (HCC) CT abd--L5 compression deformity is new since 12/11/2018 loss of approximately 25% of the central vertebral body height. >>TLSO brace  Dysuria CT renal--Bilateral nephrolithiasis. No signs of hydronephrosis, hydroureter or ureteral lithiasis UA--no pyuria  Impaired glucose tolerance 05/24/21 A1C--6.2 CBG controlled during hospitalization Outpatient PCP follow up  Essential hypertension Stopped amlodipine and spironolactone to allow BP margin to titrate metoprolol --BP controlled  Opioid dependence ([place]) PDMP reviewed --norco 10/325 #120 monthly --continue norco  Atrial fibrillation with RVR ([place]) CHADSVASc = 5 Continue diltiazem drip>>stopped after patient spontaneously converted to sinus in late morning 11/24/21 Cardiology consulted>>initially placed on metoprolol tid>>bid Now back in sinus with PACs 11/24/21 Echo EF 60-65%, G1DD, mild TR/MR, RVSP 40.7 TSH--0.575 Pt remained in sinus and  hemodynamically stable and was cleared for d/c by cardiology Patient refuses anticoagulation after  explanation of risks/benefits/alternatives -9/8 metoprolol decreased to 12.5 mg bid due to soft BP --episode may have been triggered by patient's excess caffeine intake and from Excedrin --9/12 remains in sinus, hemodynamically stable-no further afib  Chronic respiratory failure with hypoxia and hypercapnia (HCC) -Chronically on 2L -Stable  Tobacco abuse -Tobacco cessation discussed  COPD (chronic obstructive pulmonary disease) (HCC) -Continue Breztri -no wheezing -good saturation on chronic supplementation.  Hepatocellular carcinoma ([place]) Currently under care of Dr. Burr Medico at [facility] --over 5 yrs from diagnosis --sp partical hepatectomy --under observation, not on active tx   Family Communication:  no family at bedside.   Consultants:  telepsychiatry, cardiology   Code Status:  DNR   DVT Prophylaxis:  [place] Lovenox     Procedures: As Listed in Progress Note Above   Antibiotics: None     Subjective: Afebrile, no chest pain, no palpitations, no nausea or vomiting.  No overnight events.  Denies suicidal ideation currently.  Depressed mood, flat affect and decreased appetite reported.  Objective: Vitals:   12/04/21 2225 12/05/21 0632 12/05/21 0734 12/05/21 1334  BP: (!) 94/52 130/64  (!) 104/52  Pulse: 63 70  61  Resp: '20 18  18  '$ Temp: 98.2 F (36.8 C) 98.6 F (37 C)  98.3 F (36.8 C)  TempSrc: Oral   Oral  SpO2: 100% 100% 97% 100%  Weight:      Height:        Intake/Output Summary (Last 24 hours) at 12/05/2021 1553 Last data filed at 12/05/2021 1230 Gross per 24 hour  Intake 720 ml  Output --  Net 720 ml   Weight change:   Exam: General exam: Alert, awake, oriented x 3; in no acute distress.  Complaining of back pain. Respiratory system: Clear to auscultation. Respiratory effort normal.  Good saturation on chronic supplementation. Cardiovascular  system:Rate controlled.  No rubs or gallops. Gastrointestinal system: Abdomen is nondistended, soft and nontender. No organomegaly or masses felt. Normal bowel sounds heard. Central nervous system: Alert and oriented. No focal neurological deficits. Extremities: No cyanosis or clubbing. Skin: No rashes, lesions or ulcers Psychiatry: Judgement and insight appear normal.  Flat affect; depressed mood.  No suicidal ideation.   Data Reviewed: I have personally reviewed following labs and imaging studies  Urine analysis:    Component Value Date/Time   COLORURINE YELLOW 11/28/2021 [place] 11/28/2021 1119   LABSPEC 1.010 11/28/2021 1119   PHURINE 6.0 11/28/2021 1119   GLUCOSEU NEGATIVE 11/28/2021 1119   HGBUR NEGATIVE 11/28/2021 1119   BILIRUBINUR NEGATIVE 11/28/2021 1119   BILIRUBINUR negative 05/07/2021 1618   BILIRUBINUR negative 05/15/2018 0859   KETONESUR 5 (A) 11/28/2021 1119   PROTEINUR NEGATIVE 11/28/2021 1119   UROBILINOGEN 0.2 05/07/2021 1618   NITRITE NEGATIVE 11/28/2021 1119   LEUKOCYTESUR NEGATIVE 11/28/2021 1119   Sepsis Labs:  Recent Results (from the past 240 hour(s))  Urine Culture     Status: Abnormal   Collection Time: 11/28/21 11:19 AM   Specimen: Urine, Clean Catch  Result Value Ref Range Status   Specimen Description   Final    URINE, CLEAN CATCH Performed at [facility], [address] [place], [place] [address]    Special Requests   Final    NONE Performed at [facility], [address] [place], [place] [address]    Culture (A)  Final    <10,000 COLONIES/mL INSIGNIFICANT GROWTH Performed at [facility], [place] [address] [place], [place] [address]  Report Status 11/30/2021 FINAL  Final  SARS Coronavirus 2 by RT PCR (hospital order, performed in [facility] hospital lab) *cepheid single result test* Anterior Nasal Swab     Status: None   Collection Time: 12/04/21  9:42 AM   Specimen: Anterior Nasal Swab  Result Value Ref  Range Status   SARS Coronavirus 2 by RT PCR NEGATIVE NEGATIVE Final    Comment: (NOTE) SARS-CoV-2 target nucleic acids are NOT DETECTED.  The SARS-CoV-2 RNA is generally detectable in upper and lower respiratory specimens during the acute phase of infection. The lowest concentration of SARS-CoV-2 viral copies this assay can detect is 250 copies / mL. A negative result does not preclude SARS-CoV-2 infection and should not be used as the sole basis for treatment or other patient management decisions.  A negative result may occur with improper specimen collection / handling, submission of specimen other than nasopharyngeal swab, presence of viral mutation(s) within the areas targeted by this assay, and inadequate number of viral copies (<250 copies / mL). A negative result must be combined with clinical observations, patient history, and epidemiological information.  Fact Sheet for Patients:   https://www.patel.info/  Fact Sheet for Healthcare Providers: https://hall.com/  This test is not yet approved or  cleared by the [place] FDA and has been authorized for detection and/or diagnosis of SARS-CoV-2 by FDA under an Emergency Use Authorization (EUA).  This EUA will remain in effect (meaning this test can be used) for the duration of the COVID-19 declaration under Section 564(b)(1) of the Act, 21 U.S.C. section 360bbb-3(b)(1), unless the authorization is terminated or revoked sooner.  Performed at [facility], [address], [place], [place] [address]      Scheduled Meds:  cholecalciferol  2,000 Units Oral QPC lunch   enoxaparin (LOVENOX) injection  40 mg Subcutaneous Q24H   FLUoxetine  20 mg Oral Daily   gabapentin  300 mg Oral QHS   metoprolol tartrate  12.5 mg Oral BID   mirtazapine  15 mg Oral QHS   mometasone-formoterol  2 puff Inhalation BID   multivitamin with minerals  1 tablet Oral Daily   pantoprazole  40 mg Oral Daily    risperiDONE  0.25 mg Oral [id number]    Procedures/Studies:  Barton Dubois, MD  [facility]  If 7PM-7AM, please contact night-coverage [email or web address] Password [email or web address] 12/05/2021, 3:53 PM   LOS: 11 days

## 2021-12-06 DIAGNOSIS — T50902A Poisoning by unspecified drugs, medicaments and biological substances, intentional self-harm, initial encounter: Secondary | ICD-10-CM

## 2021-12-06 DIAGNOSIS — J438 Other emphysema: Secondary | ICD-10-CM

## 2021-12-06 DIAGNOSIS — I4891 Unspecified atrial fibrillation: Secondary | ICD-10-CM | POA: Diagnosis not present

## 2021-12-06 DIAGNOSIS — J9611 Chronic respiratory failure with hypoxia: Secondary | ICD-10-CM | POA: Diagnosis not present

## 2021-12-06 DIAGNOSIS — I10 Essential (primary) hypertension: Secondary | ICD-10-CM

## 2021-12-06 MED ORDER — METOPROLOL TARTRATE 25 MG PO TABS: 12.5000 mg | ORAL_TABLET | Freq: Two times a day (BID) | ORAL | 2 refills | Status: DC

## 2021-12-06 MED ORDER — POLYETHYLENE GLYCOL 3350 17 G PO PACK: 17.0000 g | PACK | Freq: Every day | ORAL | 0 refills | Status: DC | PRN

## 2021-12-06 MED ORDER — CLONAZEPAM 0.5 MG PO TABS: 0.5000 mg | ORAL_TABLET | Freq: Two times a day (BID) | ORAL | 0 refills | Status: DC | PRN

## 2021-12-06 NOTE — Progress Notes (Signed)
This CSW attempted to coordinate transfer to [facility]. CSW called [facility] and spoke with counselor Vaughan Browner, LCAS-A who advised that there is current staffing needs on the unit and unaware of this admission. CSW added [place], LCAS-A to communication form.   Venus Tolson, RN inquired who the accepting provider will be. CSW advised that CSW believed dr. Caren Griffins. CSW inquired when this pt can transfer to [facility]. CSW is still awaiting follow-up.    Care Team Notified: Vaughan Browner, [place], Grace Isaac. RN, Shimelis Zewudie, RN, [employer], RN, Rona Ravens, RN, Leonia Reader, RN, Ardelia Mems, RN, Atilano Median, RN, Donavan Foil, RN, [address], [place], MSW, [facility] 12/07/2021 12:10 AM                                                                                                                                                                                           Demetrio Lapping, RN inquired who the accepting provider will be. CSW advised that CSW believed dr. Caren Griffins. CSW inquired when this pt can transfer to [facility]. CSW is still awaiting follow-up.

## 2021-12-06 NOTE — Progress Notes (Signed)
Spoke with Management consultant, Camera operator, who states that she is not aware of the patient coming to the unit at [facility].  She stated she would contact her house supervisor and call me back to let me know if this patient does have a bed tonight

## 2021-12-06 NOTE — Discharge Summary (Signed)
Physician Discharge Summary   Patient: Tina Bailey MRN: [id number] DOB: 1945-05-16  Admit date:     11/22/2021  Discharge date: 12/06/21  Discharge Physician: Barton Dubois   PCP: Vilinda Boehringer, NP   Recommendations at discharge:  Repeat basic metabolic panel to follow electrolytes and renal function Reassess blood pressure and further adjust antihypertensive regimen as needed.  Discharge Diagnoses: Principal Problem:   Intentional overdose ([place]) Active Problems:   Depression with anxiety   Hepatocellular carcinoma (HCC)   COPD (chronic obstructive pulmonary disease) (HCC)   Tobacco abuse   Severe episode of recurrent major depressive disorder, without psychotic features (HCC)   Chronic respiratory failure with hypoxia and hypercapnia (HCC)   Atrial fibrillation with RVR (HCC)   Opioid dependence (HCC)   Essential hypertension   Impaired glucose tolerance   Dysuria   Lumbar compression fracture (HCC)   Suicidal ideation  Hospital Course: 76 year old female with a history of COPD, hepatocellular carcinoma, opioid dependence with chronic abdominal and chronic back pain, depression, tobacco abuse, hypertension, chronic respiratory failure on 2 L presenting with suicide attempt.  The patient presented to the ED on 11/22/2021 after taking 28 Excedrin on the evening of 11/21/2021.  She began having nausea and vomiting throughout the night from 11/21/2021 to 11/22/2021.  She notified her daughter on the morning of 11/22/2021 and she was brought to the emergency department for further evaluation.  [facility]  The patient was evaluated by psychiatry who recommended inpatient evaluation and treatment.  While waiting for an inpatient [facility] psychiatric bed, the patient developed atrial fibrillation with RVR on the morning of 11/24/2021 with heart rate in the 170s.  She was given a diltiazem bolus, 5 mg and then started on a drip.  Admission was requested. The patient denies  any fever, chills, headache, chest pain, shortness breath, vomiting, diarrhea.  She has chronic nausea.  She has not had a bowel movement.  There is no hematochezia or melena.  She denies any coughing or hemoptysis. In the ED, the patient was afebrile hemodynamically stable albeit with soft blood pressures after starting diltiazem.  Oxygen saturation is 96% on 2 L.  WBC 7.1, hemoglobin 12.3, platelets 228,000.  BMP showed sodium 137, potassium 3.9, bicarbonate 27, serum creatinine 0.64.  EKG showed atrial fibrillation with nonspecific ST-T wave change.  Urine drug screen was positive for opiates.  Cardiology was consulted to assist with management.  Fortunately, patient spontaneously converted to sinus in late morning 11/24/21 and she remained in sinus thereafter.  Cardiology changed her regimen to metoprolol 25 mg bid and midodrine was stopped.  Patient remained stable without new afib.  Metoprolol was decreased to 12.5 mg bid and HR remained controlled and pt remained hemodynamically stable.  Prolonged hospitalization due to difficulty finding available [facility] facility. She is cleared medically to go to [facility] facility.  Assessment and Plan: * Intentional overdose ([place]) - Evaluated by psychiatry service with recommendation for inpatient psychiatry placement. -Continue to follow psychiatry service recommendation for further adjustment into current regimen to stabilize mood. -Continue Remeron, Prozac and as needed Klonopin.  Depression with anxiety -Continue demonstrating depression and flat affect. -Currently denying suicidal ideation. -telepsychiatry>>>recommending d/c to inpatient [facility] facility. -Currently accepted to [facility] (Monday 12/06/21). -Continue fluoxetine, mirtazapine and PRN klonopin -Patient is hemodynamically stable to discharge to psychiatric facility when bed available. -Continue supportive care and constant reorientation.   Lumbar compression fracture (HCC) CT abd--L5 compression  deformity is new since 12/11/2018 loss  of approximately 25% of the central vertebral body height. >>TLSO brace patient is up and performing physical activity.  Dysuria CT renal--Bilateral nephrolithiasis. No signs of hydronephrosis, hydroureter or ureteral lithiasis UA--no pyuria  Impaired glucose tolerance 05/24/21 A1C--6.2 CBG controlled during hospitalization Outpatient PCP follow up  Essential hypertension Stopped amlodipine and spironolactone to allow BP margin to titrate metoprolol --BP controlled  Opioid dependence ([place]) PDMP reviewed --norco 10/325 #120 monthly --continue norco  Atrial fibrillation with RVR ([place]) CHADSVASc = 5 Continue diltiazem drip>>stopped after patient spontaneously converted to sinus in late morning 11/24/21 Cardiology consulted>>initially placed on metoprolol tid>>bid Now back in sinus with PACs 11/24/21 Echo EF 60-65%, G1DD, mild TR/MR, RVSP 40.7 TSH--0.575 Pt remained in sinus and hemodynamically stable and was cleared for d/c by cardiology Patient refuses anticoagulation after explanation of risks/benefits/alternatives -9/8 metoprolol decreased to 12.5 mg bid due to soft BP --episode may have been triggered by patient's excess caffeine intake and from Excedrin --9/12 remains in sinus, hemodynamically stable-no further afib  Chronic respiratory failure with hypoxia and hypercapnia (HCC) -Chronically on 2L -Stable  Tobacco abuse -Tobacco cessation discussed  COPD (chronic obstructive pulmonary disease) (HCC) -Continue Breztri -no wheezing -good saturation on chronic supplementation (2 L nasal cannula).  Hepatocellular carcinoma ([place]) -Currently under care of Dr. Burr Medico at [facility] --over 5 yrs from diagnosis --sp partical hepatectomy --under observation, not on active tx -Continue outpatient follow-up.   Consultants: Psychiatry service. Procedures performed: See below for x-ray reports Disposition: Patient will be transferred  to [facility]. Diet recommendation: Heart healthy diet.  DISCHARGE MEDICATION: Allergies as of 12/06/2021       Reactions   Zoloft [sertraline Hcl] Other (See Comments)   Zoloft, had a terrible reaction   Amoxicillin-pot Clavulanate Nausea Only   Amoxacillin Augmentin combo  Has patient had a PCN reaction causing immediate rash, facial/tongue/throat swelling, SOB or lightheadedness with hypotension: No Has patient had a PCN reaction causing severe rash involving mucus membranes or skin necrosis: No Has patient had a PCN reaction that required hospitalization No Has patient had a PCN reaction occurring within the last 10 years: No If all of the above answers are "NO", then may proceed with Cephalosporin use.   Clindamycin/lincomycin Other (See Comments)   Felt like it "burned out" her stomach/took a large dose   Paxil [paroxetine Hcl] Rash   Sulfa Antibiotics Rash        Medication List     STOP taking these medications    amLODipine 5 MG tablet Commonly known as: NORVASC   loperamide 2 MG capsule Commonly known as: IMODIUM   magnesium hydroxide 400 MG/5ML suspension Commonly known as: MILK OF MAGNESIA   spironolactone 25 MG tablet Commonly known as: ALDACTONE       TAKE these medications    Breztri Aerosphere 160-9-4.8 MCG/ACT Aero Generic drug: Budeson-Glycopyrrol-Formoterol Take 2 puffs first thing in am and then another 2 puffs about 12 hours later.   clonazePAM 0.5 MG tablet Commonly known as: KLONOPIN Take 1 tablet (0.5 mg total) by mouth every 12 (twelve) hours as needed (Anxiety).   FLUoxetine 20 MG capsule Commonly known as: PROZAC Take 1 capsule (20 mg total) by mouth daily. What changed: how much to take   gabapentin 300 MG capsule Commonly known as: NEURONTIN Take 1 capsule (300 mg total) by mouth at bedtime.   HYDROcodone-acetaminophen 10-325 MG tablet Commonly known as: NORCO Take 1 tablet by mouth in the morning,  at noon, in the  evening, and at bedtime.   metoprolol tartrate 25 MG tablet Commonly known as: LOPRESSOR Take 0.5 tablets (12.5 mg total) by mouth 2 (two) times daily.   mirtazapine 15 MG tablet Commonly known as: REMERON Take 1 tablet (15 mg total) by mouth at bedtime.   multivitamin tablet Take 1 tablet by mouth daily.   omeprazole 20 MG capsule Commonly known as: PRILOSEC Take 20 mg by mouth daily.   OXYGEN Inhale 2 L into the lungs continuous.   polyethylene glycol 17 g packet Commonly known as: MIRALAX / GLYCOLAX Take 17 g by mouth daily as needed for mild constipation.   risperiDONE 0.25 MG tablet Commonly known as: RISPERDAL Take 1 tablet (0.25 mg total) by mouth 2 (two) times daily at 8 am and 4 pm.   senna 8.6 MG Tabs tablet Commonly known as: SENOKOT Take 1 tablet by mouth daily.   Vitamin D3 50 MCG (2000 UT) capsule Take 2,000 Units by mouth daily after lunch.        Follow-up Information     Loel Dubonnet, NP Follow up.   Specialty: Cardiology Why: [facility] [place] - a cardiology follow-up has been arranged for you on Monday Dec 13, 2021 at 1:55 PM (Arrive by 1:40 PM). Contact information: [place] [address] [phone]                Discharge Exam: Filed Weights   11/22/21 1510 11/24/21 1208 11/28/21 0604  Weight: 59.4 kg 55.1 kg 57 kg   General exam: Alert, awake, oriented x 3; in no acute distress.  Complaining of back pain. Respiratory system: Clear to auscultation. Respiratory effort normal.  Good saturation on chronic supplementation. Cardiovascular system:Rate controlled.  No rubs or gallops. Gastrointestinal system: Abdomen is nondistended, soft and nontender. No organomegaly or masses felt. Normal bowel sounds heard. Central nervous system: Alert and oriented. No focal neurological deficits. Extremities: No cyanosis or clubbing. Skin: No rashes, lesions or  ulcers Psychiatry: Judgement and insight appear normal.  Flat affect; depressed mood.  No suicidal ideation.    Condition at discharge: Hemodynamically stable and in not acute distress.  The results of significant diagnostics from this hospitalization (including imaging, microbiology, ancillary and laboratory) are listed below for reference.   Imaging Studies: CT RENAL STONE STUDY  Result Date: 11/28/2021 CLINICAL DATA:  Flank pain. Dysuria. Rule out kidney stone. History of hepatocellular carcinoma and intraductal papillary mucinous neoplasm of the pancreatic body. EXAM: CT ABDOMEN AND PELVIS WITHOUT CONTRAST TECHNIQUE: Multidetector CT imaging of the abdomen and pelvis was performed following the standard protocol without IV contrast. RADIATION DOSE REDUCTION: This exam was performed according to the departmental dose-optimization program which includes automated exposure control, adjustment of the mA and/or kV according to patient size and/or use of iterative reconstruction technique. (body onc) COMPARISON:  MRI abdomen 01/10/2021 and CT AP 12/11/2018 FINDINGS: Lower chest: Trace bilateral pleural effusions. Hepatobiliary: Postoperative changes from right hepatectomy with compensatory hypertrophy of the left hepatic lobe. Within the limitations of unenhanced technique there is no focal liver abnormality identified. Chronic dilatation of the common bile duct measures 1.3 cm, image 46/5. Previously 1.3 cm. Pancreas: No signs of acute pancreatic inflammation. No main duct dilatation. The cystic lesion along the pancreatic body measures 1.4 by 1.3 by 1.6 cm, image 34/5. Previously this measured the same. Spleen: Normal in size without focal abnormality. Adrenals/Urinary Tract: Normal adrenal glands. Punctate stone within the inferior pole collecting system of the  right kidney is noted, image 68/5. Within the inferior pole of the left kidney there is a 3 mm stone, image 62/5. No kidney mass or  hydronephrosis identified bilaterally. Urinary bladder is unremarkable. Stomach/Bowel: Stomach appears normal. The appendix is visualized and is within normal limits. Sigmoid diverticulosis without signs of acute diverticulitis. No signs of bowel wall thickening, inflammation, or distension. Vascular/Lymphatic: Aortic atherosclerosis. No aneurysm. No signs of abdominopelvic adenopathy. Reproductive: Status post hysterectomy. No adnexal masses. Other: No free fluid or fluid collections Musculoskeletal: Status post ORIF of the right femur. The bones appear diffusely osteopenic. Multilevel compression fractures are identified involving essentially every level within the lumbar spine. When compared with the exam from 12/11/2018 there is been interval vertebroplasty at T12 and L4. New compression deformity is identified involving the L5 vertebral body. Here, there has been loss of approximately 25% of the central vertebral body height. IMPRESSION: 1. No acute findings within the abdomen or pelvis. 2. Bilateral nephrolithiasis. No signs of hydronephrosis, hydroureter or ureteral lithiasis. 3. Multilevel compression fractures involving essentially every level within the lumbar spine. When compared with the exam from 12/11/2018 there has been interval vertebroplasty at T12 and L4. L5 compression deformity is new since 12/11/2018 loss of approximately 25% of the central vertebral body height. 4. Stable cystic lesion along the pancreatic body. 5. Trace bilateral pleural effusions. 6. Aortic Atherosclerosis (ICD10-I70.0). Electronically Signed   By: Kerby Moors M.D.   On: 11/28/2021 12:14   ECHOCARDIOGRAM COMPLETE  Result Date: 11/24/2021    ECHOCARDIOGRAM REPORT   Patient Name:   Tina Bailey Date of Exam: 11/24/2021 Medical Rec #:  [id number]         Height:       60.0 in Accession #:    [id number]        Weight:       121.5 lb Date of Birth:  1945/05/25        BSA:          1.510 m Patient Age:    47 years           BP:           109/42 mmHg Patient Gender: F                 HR:           67 bpm. Exam Location:  [facility] Procedure: 2D Echo, Cardiac Doppler and Color Doppler Indications:    Atrial Fibrillation  History:        Patient has prior history of Echocardiogram examinations, most                 recent 03/02/2018. CHF, Pulmonary HTN and COPD,                 Arrythmias:Atrial Fibrillation; Risk Factors:Former Smoker.  Sonographer:    Wenda Low Referring Phys: [phone] DAVID TAT IMPRESSIONS  1. Left ventricular ejection fraction, by estimation, is 60 to 65%. The left ventricle has normal function. The left ventricle has no regional wall motion abnormalities. There is mild left ventricular hypertrophy. Left ventricular diastolic parameters are consistent with Grade I diastolic dysfunction (impaired relaxation).  2. Right ventricular systolic function is normal. The right ventricular size is normal. There is mildly elevated pulmonary artery systolic pressure.  3. Linear density in right atrium poorly visualized, unclear etiology.  4. The mitral valve is abnormal. Mild mitral valve regurgitation. No evidence of mitral stenosis.  5. The tricuspid valve is  abnormal.  6. The aortic valve is tricuspid. There is mild calcification of the aortic valve. There is mild thickening of the aortic valve. Aortic valve regurgitation is not visualized. No aortic stenosis is present. FINDINGS  Left Ventricle: Left ventricular ejection fraction, by estimation, is 60 to 65%. The left ventricle has normal function. The left ventricle has no regional wall motion abnormalities. The left ventricular internal cavity size was normal in size. There is  mild left ventricular hypertrophy. Left ventricular diastolic parameters are consistent with Grade I diastolic dysfunction (impaired relaxation). Normal left ventricular filling pressure. Right Ventricle: The right ventricular size is normal. Right vetricular wall thickness was not well  visualized. Right ventricular systolic function is normal. There is mildly elevated pulmonary artery systolic pressure. The tricuspid regurgitant velocity  is 3.07 m/s, and with an assumed right atrial pressure of 3 mmHg, the estimated right ventricular systolic pressure is 37.8 mmHg. Left Atrium: Left atrial size was normal in size. Right Atrium: Linear density in right atrium poorly visualized, unclear etiology. Right atrial size was normal in size. Pericardium: There is no evidence of pericardial effusion. Mitral Valve: The mitral valve is abnormal. Mild mitral valve regurgitation. No evidence of mitral valve stenosis. MV peak gradient, 4.2 mmHg. The mean mitral valve gradient is 2.0 mmHg. Tricuspid Valve: The tricuspid valve is abnormal. Tricuspid valve regurgitation is mild . No evidence of tricuspid stenosis. Aortic Valve: The aortic valve is tricuspid. There is mild calcification of the aortic valve. There is mild thickening of the aortic valve. There is mild aortic valve annular calcification. Aortic valve regurgitation is not visualized. No aortic stenosis  is present. Aortic valve mean gradient measures 3.5 mmHg. Aortic valve peak gradient measures 8.1 mmHg. Aortic valve area, by VTI measures 2.57 cm. Pulmonic Valve: The pulmonic valve was not well visualized. Pulmonic valve regurgitation is not visualized. No evidence of pulmonic stenosis. Aorta: The aortic root is normal in size and structure. Venous: Small IVC suggesting low RA pressure and hypovolemia. IAS/Shunts: No atrial level shunt detected by color flow Doppler.  LEFT VENTRICLE PLAX 2D LVIDd:         4.30 cm   Diastology LVIDs:         2.90 cm   LV e' medial:    6.85 cm/s LV PW:         0.90 cm   LV E/e' medial:  13.2 LV IVS:        1.20 cm   LV e' lateral:   8.92 cm/s LVOT diam:     2.00 cm   LV E/e' lateral: 10.1 LV SV:         76 LV SV Index:   50 LVOT Area:     3.14 cm  RIGHT VENTRICLE RV Basal diam:  3.35 cm RV Mid diam:    2.10 cm RV S  prime:     11.10 cm/s TAPSE (M-mode): 1.8 cm LEFT ATRIUM             Index        RIGHT ATRIUM           Index LA diam:        3.40 cm 2.25 cm/m   RA Area:     13.10 cm LA Vol (A2C):   50.2 ml 33.24 ml/m  RA Volume:   28.50 ml  18.87 ml/m LA Vol (A4C):   35.7 ml 23.64 ml/m LA Biplane Vol: 44.6 ml 29.53 ml/m  AORTIC VALVE  PULMONIC VALVE AV Area (Vmax):    2.30 cm     PV Vmax:       0.83 m/s AV Area (Vmean):   2.29 cm     PV Peak grad:  2.8 mmHg AV Area (VTI):     2.57 cm AV Vmax:           142.00 cm/s AV Vmean:          86.250 cm/s AV VTI:            0.295 m AV Peak Grad:      8.1 mmHg AV Mean Grad:      3.5 mmHg LVOT Vmax:         104.00 cm/s LVOT Vmean:        63.000 cm/s LVOT VTI:          0.241 m LVOT/AV VTI ratio: 0.82  AORTA Ao Root diam: 3.50 cm MITRAL VALVE               TRICUSPID VALVE MV Area (PHT): 4.29 cm    TR Peak grad:   37.7 mmHg MV Area VTI:   2.22 cm    TR Vmax:        307.00 cm/s MV Peak grad:  4.2 mmHg MV Mean grad:  2.0 mmHg    SHUNTS MV Vmax:       1.03 m/s    Systemic VTI:  0.24 m MV Vmean:      59.2 cm/s   Systemic Diam: 2.00 cm MV Decel Time: 177 msec MV E velocity: 90.40 cm/s MV A velocity: 93.80 cm/s MV E/A ratio:  0.96 Carlyle Dolly MD Electronically signed by Carlyle Dolly MD Signature Date/Time: 11/24/2021/4:11:35 PM    Final     Microbiology: Results for orders placed or performed during the hospital encounter of 11/22/21  SARS Coronavirus 2 by RT PCR (hospital order, performed in [facility] hospital lab) *cepheid single result test* Anterior Nasal Swab     Status: None   Collection Time: 11/22/21  4:26 PM   Specimen: Anterior Nasal Swab  Result Value Ref Range Status   SARS Coronavirus 2 by RT PCR NEGATIVE NEGATIVE Final    Comment: (NOTE) SARS-CoV-2 target nucleic acids are NOT DETECTED.  The SARS-CoV-2 RNA is generally detectable in upper and lower respiratory specimens during the acute phase of infection. The lowest concentration of  SARS-CoV-2 viral copies this assay can detect is 250 copies / mL. A negative result does not preclude SARS-CoV-2 infection and should not be used as the sole basis for treatment or other patient management decisions.  A negative result may occur with improper specimen collection / handling, submission of specimen other than nasopharyngeal swab, presence of viral mutation(s) within the areas targeted by this assay, and inadequate number of viral copies (<250 copies / mL). A negative result must be combined with clinical observations, patient history, and epidemiological information.  Fact Sheet for Patients:   https://www.patel.info/  Fact Sheet for Healthcare Providers: https://hall.com/  This test is not yet approved or  cleared by the [place] FDA and has been authorized for detection and/or diagnosis of SARS-CoV-2 by FDA under an Emergency Use Authorization (EUA).  This EUA will remain in effect (meaning this test can be used) for the duration of the COVID-19 declaration under Section 564(b)(1) of the Act, 21 U.S.C. section 360bbb-3(b)(1), unless the authorization is terminated or revoked sooner.  Performed at [facility], [address], [place], [place] [address]  MRSA Next Gen by PCR, Nasal     Status: None   Collection Time: 11/24/21 11:55 AM   Specimen: Nasal Mucosa; Nasal Swab  Result Value Ref Range Status   MRSA by PCR Next Gen NOT DETECTED NOT DETECTED Final    Comment: (NOTE) The GeneXpert MRSA Assay (FDA approved for NASAL specimens only), is one component of a comprehensive MRSA colonization surveillance program. It is not intended to diagnose MRSA infection nor to guide or monitor treatment for MRSA infections. Test performance is not FDA approved in patients less than 28 years old. Performed at [facility], [address] [place], [place] [address]   Urine Culture     Status: Abnormal   Collection Time: 11/28/21  11:19 AM   Specimen: Urine, Clean Catch  Result Value Ref Range Status   Specimen Description   Final    URINE, CLEAN CATCH Performed at [facility], [address], [place], [place] [address]    Special Requests   Final    NONE Performed at [facility], [address] [place], [place] [address]    Culture (A)  Final    <10,000 COLONIES/mL INSIGNIFICANT GROWTH Performed at [facility], [place] [address], [place], [place] [address]    Report Status 11/30/2021 FINAL  Final  SARS Coronavirus 2 by RT PCR (hospital order, performed in [facility] hospital lab) *cepheid single result test* Anterior Nasal Swab     Status: None   Collection Time: 12/04/21  9:42 AM   Specimen: Anterior Nasal Swab  Result Value Ref Range Status   SARS Coronavirus 2 by RT PCR NEGATIVE NEGATIVE Final    Comment: (NOTE) SARS-CoV-2 target nucleic acids are NOT DETECTED.  The SARS-CoV-2 RNA is generally detectable in upper and lower respiratory specimens during the acute phase of infection. The lowest concentration of SARS-CoV-2 viral copies this assay can detect is 250 copies / mL. A negative result does not preclude SARS-CoV-2 infection and should not be used as the sole basis for treatment or other patient management decisions.  A negative result may occur with improper specimen collection / handling, submission of specimen other than nasopharyngeal swab, presence of viral mutation(s) within the areas targeted by this assay, and inadequate number of viral copies (<250 copies / mL). A negative result must be combined with clinical observations, patient history, and epidemiological information.  Fact Sheet for Patients:   https://www.patel.info/  Fact Sheet for Healthcare Providers: https://hall.com/  This test is not yet approved or  cleared by the [place] FDA and has been authorized for detection and/or diagnosis of SARS-CoV-2 by FDA under an  Emergency Use Authorization (EUA).  This EUA will remain in effect (meaning this test can be used) for the duration of the COVID-19 declaration under Section 564(b)(1) of the Act, 21 U.S.C. section 360bbb-3(b)(1), unless the authorization is terminated or revoked sooner.  Performed at [facility], [address], [place], [place] [address]      Discharge time spent: greater than 30 minutes.  Signed: Barton Dubois, MD [facility] 12/06/2021

## 2021-12-06 NOTE — Progress Notes (Signed)
Per [facility] Leonia Reader, RN pt will admit during night shift. This CSW will assist and follow up with [facility] to coordinate transfer.  Benjaman Kindler, MSW, [facility] 12/06/2021 12:38 PM

## 2021-12-06 NOTE — Progress Notes (Incomplete)
This CSW attempted to coordinate transfer to [facility]. CSW called [facility] and spoke with counselor Vaughan Browner, LCAS-A who advised that there is current staffing needs on the unit and unaware of this admission. CSW added Handy, LCAS-A to communication form.

## 2021-12-07 ENCOUNTER — Other Ambulatory Visit: Payer: Self-pay

## 2021-12-07 ENCOUNTER — Inpatient Hospital Stay
Admission: RE | Admit: 2021-12-07 | Discharge: 2021-12-17 | DRG: 885 | Disposition: A | Discharge status: Home / Self Care | Payer: Medicare HMO | Source: Intra-hospital | Attending: Psychiatry | Admitting: Psychiatry

## 2021-12-07 ENCOUNTER — Encounter: Payer: Self-pay | Admitting: Psychiatry

## 2021-12-07 ENCOUNTER — Emergency Department: Admission: AD | Admit: 2021-12-07 | Payer: Medicare HMO | Source: Intra-hospital | Admitting: Psychiatry

## 2021-12-07 DIAGNOSIS — Z87442 Personal history of urinary calculi: Secondary | ICD-10-CM

## 2021-12-07 DIAGNOSIS — Z7984 Long term (current) use of oral hypoglycemic drugs: Secondary | ICD-10-CM

## 2021-12-07 DIAGNOSIS — J449 Chronic obstructive pulmonary disease, unspecified: Secondary | ICD-10-CM | POA: Diagnosis present

## 2021-12-07 DIAGNOSIS — Z8249 Family history of ischemic heart disease and other diseases of the circulatory system: Secondary | ICD-10-CM | POA: Diagnosis not present

## 2021-12-07 DIAGNOSIS — F1120 Opioid dependence, uncomplicated: Secondary | ICD-10-CM | POA: Diagnosis present

## 2021-12-07 DIAGNOSIS — M797 Fibromyalgia: Secondary | ICD-10-CM | POA: Diagnosis present

## 2021-12-07 DIAGNOSIS — Z90711 Acquired absence of uterus with remaining cervical stump: Secondary | ICD-10-CM

## 2021-12-07 DIAGNOSIS — I4891 Unspecified atrial fibrillation: Secondary | ICD-10-CM | POA: Diagnosis present

## 2021-12-07 DIAGNOSIS — C220 Liver cell carcinoma: Secondary | ICD-10-CM | POA: Diagnosis present

## 2021-12-07 DIAGNOSIS — Z87891 Personal history of nicotine dependence: Secondary | ICD-10-CM | POA: Diagnosis not present

## 2021-12-07 DIAGNOSIS — M546 Pain in thoracic spine: Secondary | ICD-10-CM | POA: Diagnosis present

## 2021-12-07 DIAGNOSIS — J9610 Chronic respiratory failure, unspecified whether with hypoxia or hypercapnia: Secondary | ICD-10-CM | POA: Diagnosis present

## 2021-12-07 DIAGNOSIS — F332 Major depressive disorder, recurrent severe without psychotic features: Secondary | ICD-10-CM | POA: Diagnosis present

## 2021-12-07 DIAGNOSIS — Z8505 Personal history of malignant neoplasm of liver: Secondary | ICD-10-CM | POA: Diagnosis not present

## 2021-12-07 DIAGNOSIS — F419 Anxiety disorder, unspecified: Secondary | ICD-10-CM | POA: Diagnosis present

## 2021-12-07 DIAGNOSIS — M810 Age-related osteoporosis without current pathological fracture: Secondary | ICD-10-CM | POA: Diagnosis present

## 2021-12-07 DIAGNOSIS — F411 Generalized anxiety disorder: Secondary | ICD-10-CM | POA: Diagnosis present

## 2021-12-07 DIAGNOSIS — I110 Hypertensive heart disease with heart failure: Secondary | ICD-10-CM | POA: Diagnosis present

## 2021-12-07 DIAGNOSIS — I712 Thoracic aortic aneurysm, without rupture, unspecified: Secondary | ICD-10-CM | POA: Diagnosis present

## 2021-12-07 DIAGNOSIS — Z803 Family history of malignant neoplasm of breast: Secondary | ICD-10-CM

## 2021-12-07 DIAGNOSIS — I5032 Chronic diastolic (congestive) heart failure: Secondary | ICD-10-CM | POA: Diagnosis present

## 2021-12-07 DIAGNOSIS — Z881 Allergy status to other antibiotic agents status: Secondary | ICD-10-CM

## 2021-12-07 DIAGNOSIS — H3530 Unspecified macular degeneration: Secondary | ICD-10-CM | POA: Diagnosis present

## 2021-12-07 DIAGNOSIS — Z9151 Personal history of suicidal behavior: Secondary | ICD-10-CM

## 2021-12-07 DIAGNOSIS — G8929 Other chronic pain: Secondary | ICD-10-CM | POA: Diagnosis present

## 2021-12-07 DIAGNOSIS — Z9861 Coronary angioplasty status: Secondary | ICD-10-CM

## 2021-12-07 DIAGNOSIS — I272 Pulmonary hypertension, unspecified: Secondary | ICD-10-CM | POA: Diagnosis present

## 2021-12-07 DIAGNOSIS — Z823 Family history of stroke: Secondary | ICD-10-CM | POA: Diagnosis not present

## 2021-12-07 DIAGNOSIS — I10 Essential (primary) hypertension: Secondary | ICD-10-CM | POA: Diagnosis present

## 2021-12-07 DIAGNOSIS — Z79899 Other long term (current) drug therapy: Secondary | ICD-10-CM

## 2021-12-07 DIAGNOSIS — Z9841 Cataract extraction status, right eye: Secondary | ICD-10-CM

## 2021-12-07 DIAGNOSIS — Z9981 Dependence on supplemental oxygen: Secondary | ICD-10-CM | POA: Diagnosis not present

## 2021-12-07 DIAGNOSIS — Z888 Allergy status to other drugs, medicaments and biological substances status: Secondary | ICD-10-CM

## 2021-12-07 DIAGNOSIS — Z66 Do not resuscitate: Secondary | ICD-10-CM | POA: Diagnosis present

## 2021-12-07 DIAGNOSIS — Z9842 Cataract extraction status, left eye: Secondary | ICD-10-CM

## 2021-12-07 MED ORDER — FLUOXETINE HCL 20 MG PO CAPS
20.0000 mg | ORAL_CAPSULE | Freq: Every day | ORAL | Status: DC
  Administered 2021-12-07 – 2021-12-17 (×11): 20 mg via ORAL
  Filled 2021-12-07 (×11): qty 1

## 2021-12-07 MED ORDER — CLONAZEPAM 0.5 MG PO TABS: 0.5000 mg | ORAL_TABLET | Freq: Two times a day (BID) | ORAL | Status: DC | PRN

## 2021-12-07 MED ORDER — GABAPENTIN 300 MG PO CAPS
300.0000 mg | ORAL_CAPSULE | Freq: Every day | ORAL | Status: DC
  Administered 2021-12-07 – 2021-12-16 (×10): 300 mg via ORAL
  Filled 2021-12-07 (×10): qty 1

## 2021-12-07 MED ORDER — HYDROCODONE-ACETAMINOPHEN 10-325 MG PO TABS
1.0000 | ORAL_TABLET | Freq: Three times a day (TID) | ORAL | Status: DC | PRN
  Administered 2021-12-07 – 2021-12-08 (×3): 1 via ORAL
  Filled 2021-12-07 (×3): qty 1

## 2021-12-07 MED ORDER — MAGNESIUM HYDROXIDE 400 MG/5ML PO SUSP
30.0000 mL | Freq: Every day | ORAL | Status: DC | PRN
  Administered 2021-12-08: 30 mL via ORAL
  Filled 2021-12-07: qty 30

## 2021-12-07 MED ORDER — ACETAMINOPHEN 325 MG PO TABS
650.0000 mg | ORAL_TABLET | Freq: Four times a day (QID) | ORAL | Status: DC | PRN
  Administered 2021-12-07 – 2021-12-08 (×3): 650 mg via ORAL
  Filled 2021-12-07 (×3): qty 2

## 2021-12-07 MED ORDER — METOPROLOL TARTRATE 25 MG PO TABS
12.5000 mg | ORAL_TABLET | Freq: Two times a day (BID) | ORAL | Status: DC
  Administered 2021-12-08 – 2021-12-17 (×16): 12.5 mg via ORAL
  Filled 2021-12-07 (×20): qty 1

## 2021-12-07 MED ORDER — ALUM & MAG HYDROXIDE-SIMETH 200-200-20 MG/5ML PO SUSP
30.0000 mL | ORAL | Status: DC | PRN
  Administered 2021-12-12: 30 mL via ORAL
  Filled 2021-12-07 (×2): qty 30

## 2021-12-07 MED ORDER — CLONAZEPAM 0.5 MG PO TABS
0.5000 mg | ORAL_TABLET | Freq: Every day | ORAL | Status: DC | PRN
  Administered 2021-12-07 – 2021-12-14 (×7): 0.5 mg via ORAL
  Filled 2021-12-07 (×7): qty 1

## 2021-12-07 NOTE — H&P (Cosign Needed Addendum)
Psychiatric Admission Assessment Adult  Patient Identification: Tina Bailey MRN:  [id number] Date of Evaluation:  12/07/2021 Chief Complaint:  Major depressive disorder, recurrent severe without psychotic features ([place]) [F33.2] Principal Diagnosis: Severe recurrent major depression without psychotic features ([place]) Diagnosis:  Principal Problem:   Severe recurrent major depression without psychotic features ([place]) Active Problems:   Major depressive disorder, recurrent severe without psychotic features ([place])  History of Present Illness:  76 yo female admitted after an overdose on Excedrin; was medically cleared at [facility] from admit in early September; presents here today for psychiatric evaluation; denies suicidal/homicidal thoughts upon assessment; denies aud/visual hallucinations; denies depression/anxiety at this time; her primary concern right now is her pain management, stating that she has "fractures" and takes Clonazepam 1.'5mg'$  and Norco 10/325 daily, confirmed via PDMP;   Psychiatric history includes per pt one suicide attempt last year, pt unable to recall the medication; when asked why she wanted to end her life, she states " its just time to call it quits due to the pain"; has support from her daughter [name] who lives locally and her son Ovid Curd who lives out of state. She denies using marijuana in any form or other ellicit substances; states that she has "one white russian once a year, if that"; she lives at "[facility]".  Associated Signs/Symptoms: Depression Symptoms:  suicidal attempt, Duration of Depression Symptoms: Greater than two weeks  (Hypo) Manic Symptoms:   none Anxiety Symptoms:   denies Psychotic Symptoms:   denies aud/vis hallucinations, denies paranoia PTSD Symptoms: Negative Total Time spent with patient: 1 hour  Past Psychiatric History: depression, anxiety  Is the patient at risk to self? Yes.    Has the patient been a risk to self in the past 6  months? Yes.    Has the patient been a risk to self within the distant past? Yes.    Is the patient a risk to others? No.  Has the patient been a risk to others in the past 6 months? No.  Has the patient been a risk to others within the distant past? No.   [place] Scale:  [place] Admission (Current) from 12/07/2021 in [place] ED to Hosp-Admission (Discharged) from 11/22/2021 in [place] ED from 07/25/2021 in [place] DEPT  C-SSRS RISK CATEGORY No Risk Error: Q7 should not be populated when Q6 is No High Risk        Prior Inpatient Therapy:   Prior Outpatient Therapy:    Alcohol Screening: 1. How often do you have a drink containing alcohol?: Never 2. How many drinks containing alcohol do you have on a typical day when you are drinking?: 1 or 2 3. How often do you have six or more drinks on one occasion?: Never AUDIT-C Score: 0 4. How often during the last year have you found that you were not able to stop drinking once you had started?: Never 5. How often during the last year have you failed to do what was normally expected from you because of drinking?: Never 6. How often during the last year have you needed a first drink in the morning to get yourself going after a heavy drinking session?: Never 7. How often during the last year have you had a feeling of guilt of remorse after drinking?: Never 8. How often during the last year have you been unable to remember what happened the night before because you had been drinking?: Never 9. Have  you or someone else been injured as a result of your drinking?: No 10. Has a relative or friend or a doctor or another health worker been concerned about your drinking or suggested you cut down?: No Alcohol Use Disorder Identification Test Final Score (AUDIT): 0 Substance Abuse History in the last 12 months:  No. Consequences of Substance Abuse: NA Previous Psychotropic  Medications: Yes  Psychological Evaluations: Yes  Past Medical History:  Past Medical History:  Diagnosis Date   Arthritis    DDD. Right shoulder"is frozen"-limited ROM. osteoporosis.   Blue toe syndrome ([place]) 11/27/2014   Dr. Claudia Pollock evaluating   Chronic diastolic CHF (congestive heart failure) (HCC)    Chronic respiratory failure (HCC)    Complication of anesthesia    COPD (chronic obstructive pulmonary disease) ([place])    Fibromyalgia    Fracture of rib of right side    hx "osteoporosis"- states her dog nudge her on the side, next day developed great pain and was told has a fracture rib.   Liver cancer ([place]) 12/2014   Macular degeneration    R eye   Osteoporosis    PONV (postoperative nausea and vomiting)    nausea,severe vomiting after 01-13-15 portal vein embolization   Productive cough    Retina disorder    L eye, vision distorted, edema   Right atrial mass    Spine fracture due to birth trauma    Thoracic aortic aneurysm (TAA) ([place])    TMJ disease    Wears glasses     Past Surgical History:  Procedure Laterality Date   ANGIOPLASTY  2008   no stents required, no follow-up with cardiologist, no recurrent chest pain   CATARACT EXTRACTION, BILATERAL Bilateral    ESOPHAGOGASTRODUODENOSCOPY (EGD) WITH PROPOFOL N/A 06/25/2015   Procedure: ESOPHAGOGASTRODUODENOSCOPY (EGD) WITH PROPOFOL;  Surgeon: Milus Banister, MD;  Location: [facility];  Service: Endoscopy;  Laterality: N/A;  stent removal    EYE SURGERY     cornea surgery   INTRAMEDULLARY (IM) NAIL INTERTROCHANTERIC Right 12/04/2017   Procedure: INTRAMEDULLARY (IM) NAIL INTERTROCHANTRIC;  Surgeon: Rod Can, MD;  Location: [facility];  Service: Orthopedics;  Laterality: Right;   IR RADIOLOGIST EVAL & MGMT  03/29/2017   KIDNEY STONE SURGERY     LAPAROSCOPIC PARTIAL HEPATECTOMY N/A 03/04/2015   Procedure: DIAGNOSTIC LAPAROSCOPY, EXTENDED RIGHT HEPATECTOMY, WITH INTRAOPERATIVE ULTRASOUND;  Surgeon: Stark Klein, MD;   Location: [facility];  Service: General;  Laterality: N/A;   PARTIAL HYSTERECTOMY     portal vein embolization     01-13-15 -Dr. Cathlean Sauer.   Family History:  Family History  Problem Relation Age of Onset   Hypertension Mother    Atrial fibrillation Mother    Heart disease Mother        after age 39   Breast cancer Mother    Stroke Father    Dementia Father    Peptic Ulcer Father    Heart disease Brother        After age 40- A-Fib   Heart attack Brother    Family Psychiatric  History: see above Tobacco Screening:   Social History:  Social History   Substance and Sexual Activity  Alcohol Use No   Alcohol/week: 0.0 standard drinks of alcohol     Social History   Substance and Sexual Activity  Drug Use No    Additional Social History:  Lives at [facility]     Allergies:   Allergies  Allergen Reactions   Zoloft [Sertraline  Hcl] Other (See Comments)    Zoloft, had a terrible reaction   Amoxicillin-Pot Clavulanate Nausea Only    Amoxacillin Augmentin combo  Has patient had a PCN reaction causing immediate rash, facial/tongue/throat swelling, SOB or lightheadedness with hypotension: No Has patient had a PCN reaction causing severe rash involving mucus membranes or skin necrosis: No Has patient had a PCN reaction that required hospitalization No Has patient had a PCN reaction occurring within the last 10 years: No If all of the above answers are "NO", then may proceed with Cephalosporin use.    Clindamycin/Lincomycin Other (See Comments)    Felt like it "burned out" her stomach/took a large dose   Paxil [Paroxetine Hcl] Rash   Sulfa Antibiotics Rash   Lab Results: No results found for this or any previous visit (from the past 48 hour(s)).  Blood Alcohol level:  Lab Results  Component Value Date   ETH <10 11/22/2021   ETH <10 29/51/8841    Metabolic Disorder Labs:  Lab Results  Component Value Date   HGBA1C 6.2 05/24/2021   No results found for:  "PROLACTIN" Lab Results  Component Value Date   CHOL 190 05/24/2021   TRIG 65.0 05/24/2021   HDL 90.80 05/24/2021   CHOLHDL 2 05/24/2021   VLDL 13.0 05/24/2021   LDLCALC 86 05/24/2021   LDLCALC 82 01/09/2017    Current Medications: Current Facility-Administered Medications  Medication Dose Route Frequency Provider Last Rate Last Admin   acetaminophen (TYLENOL) tablet 650 mg  650 mg Oral Q6H PRN Patrecia Pour, NP   650 mg at 12/07/21 1218   alum & mag hydroxide-simeth (MAALOX/MYLANTA) 200-200-20 MG/5ML suspension 30 mL  30 mL Oral Q4H PRN Patrecia Pour, NP       clonazePAM Bobbye [place]) tablet 0.5 mg  0.5 mg Oral Q12H PRN Patrecia Pour, NP       FLUoxetine (PROZAC) capsule 20 mg  20 mg Oral Daily Patrecia Pour, NP       gabapentin (NEURONTIN) capsule 300 mg  300 mg Oral QHS Patrecia Pour, NP       HYDROcodone-acetaminophen (NORCO) 10-325 MG per tablet 1 tablet  1 tablet Oral TID PRN Patrecia Pour, NP       magnesium hydroxide (MILK OF MAGNESIA) suspension 30 mL  30 mL Oral Daily PRN Patrecia Pour, NP       metoprolol tartrate (LOPRESSOR) tablet 12.5 mg  12.5 mg Oral BID Patrecia Pour, NP       PTA Medications: Medications Prior to Admission  Medication Sig Dispense Refill Last Dose   Budeson-Glycopyrrol-Formoterol (BREZTRI AEROSPHERE) 160-9-4.8 MCG/ACT AERO Take 2 puffs first thing in am and then another 2 puffs about 12 hours later. 5.9 g 11    Cholecalciferol (VITAMIN D3) 50 MCG (2000 UT) capsule Take 2,000 Units by mouth daily after lunch.      clonazePAM (KLONOPIN) 0.5 MG tablet Take 1 tablet (0.5 mg total) by mouth every 12 (twelve) hours as needed (Anxiety). 30 tablet 0    FLUoxetine (PROZAC) 20 MG capsule Take 1 capsule (20 mg total) by mouth daily. (Patient taking differently: Take 60 mg by mouth daily.) 30 capsule 3    gabapentin (NEURONTIN) 300 MG capsule Take 1 capsule (300 mg total) by mouth at bedtime. 30 capsule 3    HYDROcodone-acetaminophen (NORCO) 10-325  MG tablet Take 1 tablet by mouth in the morning, at noon, in the evening, and at bedtime.      metoprolol  tartrate (LOPRESSOR) 25 MG tablet Take 0.5 tablets (12.5 mg total) by mouth 2 (two) times daily. 60 tablet 2    mirtazapine (REMERON) 15 MG tablet Take 1 tablet (15 mg total) by mouth at bedtime. (Patient not taking: Reported on 11/23/2021) 30 tablet 3    Multiple Vitamin (MULTIVITAMIN) tablet Take 1 tablet by mouth daily.      omeprazole (PRILOSEC) 20 MG capsule Take 20 mg by mouth daily. (Patient not taking: Reported on 11/23/2021)      OXYGEN Inhale 2 L into the lungs continuous.      polyethylene glycol (MIRALAX / GLYCOLAX) 17 g packet Take 17 g by mouth daily as needed for mild constipation. 14 each 0    risperiDONE (RISPERDAL) 0.25 MG tablet Take 1 tablet (0.25 mg total) by mouth 2 (two) times daily at 8 am and 4 pm. (Patient not taking: Reported on 11/23/2021) 60 tablet 3    senna (SENOKOT) 8.6 MG TABS tablet Take 1 tablet by mouth daily.       Musculoskeletal: Strength & Muscle Tone: decreased Gait & Station: normal Patient leans: N/A  Psychiatric Specialty Exam: Physical Exam Vitals and nursing note reviewed.  Constitutional:      Appearance: Normal appearance.  HENT:     Head: Normocephalic.     Nose: Nose normal.  Pulmonary:     Effort: Pulmonary effort is normal.  Musculoskeletal:        General: Normal range of motion.     Cervical back: Normal range of motion.  Neurological:     General: No focal deficit present.     Mental Status: She is alert and oriented to person, place, and time.  Psychiatric:        Attention and Perception: Attention and perception normal.        Mood and Affect: Mood is anxious and depressed.        Speech: Speech normal.        Behavior: Behavior normal. Behavior is cooperative.        Thought Content: Thought content normal.        Cognition and Memory: Cognition and memory normal.        Judgment: Judgment normal.     Review of Systems   Musculoskeletal:  Positive for back pain.  Psychiatric/Behavioral:  Positive for depression. The patient is nervous/anxious.   All other systems reviewed and are negative.   Blood pressure 96/61, pulse 65, temperature 98.6 F (37 C), temperature source Oral, resp. rate 19, height 5' (1.524 m), weight 57 kg, SpO2 100 %.Body mass index is 24.54 kg/m.  General Appearance: Casual  Eye Contact:  Good  Speech:  Normal Rate  Volume:  Normal  Mood:  Anxious and Depressed  Affect:  Congruent  Thought Process:  Coherent  Orientation:  Full (Time, Place, and Person)  Thought Content:  WDL and Logical  Suicidal Thoughts:  No  Homicidal Thoughts:  No  Memory:  Immediate;   Fair Recent;   Fair Remote;   Fair  Judgement:  Poor  Insight:  Good  Psychomotor Activity:  Decreased  Concentration:  Concentration: Good and Attention Span: Good  Recall:  Good  Fund of Knowledge:  Good  Language:  Good  Akathisia:  No  Handed:  Right  AIMS (if indicated):     Assets:  Housing Leisure Time Resilience Social Support; daughter [name]  ADL's:  Intact  Cognition:  WNL  Sleep:   poor      Physical  Exam: Physical Exam Vitals and nursing note reviewed.  Constitutional:      Appearance: Normal appearance.  HENT:     Head: Normocephalic.     Nose: Nose normal.  Pulmonary:     Effort: Pulmonary effort is normal.  Musculoskeletal:        General: Normal range of motion.     Cervical back: Normal range of motion.  Neurological:     General: No focal deficit present.     Mental Status: She is alert and oriented to person, place, and time.  Psychiatric:        Attention and Perception: Attention and perception normal.        Mood and Affect: Mood is anxious and depressed.        Speech: Speech normal.        Behavior: Behavior normal. Behavior is cooperative.        Thought Content: Thought content normal.        Cognition and Memory: Cognition and memory normal.        Judgment: Judgment  normal.    Review of Systems  Musculoskeletal:  Positive for back pain.  Psychiatric/Behavioral:  Positive for depression. The patient is nervous/anxious.   All other systems reviewed and are negative.  Blood pressure 96/61, pulse 65, temperature 98.6 F (37 C), temperature source Oral, resp. rate 19, height 5' (1.524 m), weight 57 kg, SpO2 100 %. Body mass index is 24.54 kg/m.  Treatment Plan Summary: Daily contact with patient to assess and evaluate symptoms and progress in treatment, Medication management, and Plan :  Observation Level/Precautions:  15 minute checks  Laboratory:  Completed in the hospital, stable  Psychotherapy:  individual and group therapy  Medications:  See Mar  Consultations:  none  Discharge Concerns:  none  Estimated LOS:  5-7 days  Other:     Physician Treatment Plan for Primary Diagnosis: Severe recurrent major depression without psychotic features ([place]) Long Term Goal(s): Improvement in symptoms so as ready for discharge  Opiate Dependence, long term, for chronic thoracic back pain Cont Norco 10/'325mg'$  PO TID PRN  Depression w/anxiety: Cont Clonazepam 0.5 mg daily PRN Cont Prozac '20mg'$  PO Daily  Short Term Goals: Ability to identify changes in lifestyle to reduce recurrence of condition will improve, Ability to verbalize feelings will improve, Ability to disclose and discuss suicidal ideas, Ability to demonstrate self-control will improve, Ability to identify and develop effective coping behaviors will improve, Ability to maintain clinical measurements within normal limits will improve, Compliance with prescribed medications will improve, and Ability to identify triggers associated with substance abuse/mental health issues will improve  Physician Treatment Plan for Secondary Diagnosis: Principal Problem:   Severe recurrent major depression without psychotic features ([place]) Active Problems:   Major depressive disorder, recurrent severe without psychotic  features ([place])  Long Term Goal(s): Improvement in symptoms so as ready for discharge  Short Term Goals: Ability to identify changes in lifestyle to reduce recurrence of condition will improve, Ability to verbalize feelings will improve, Ability to disclose and discuss suicidal ideas, Ability to demonstrate self-control will improve, Ability to identify and develop effective coping behaviors will improve, Ability to maintain clinical measurements within normal limits will improve, Compliance with prescribed medications will improve, and Ability to identify triggers associated with substance abuse/mental health issues will improve  I certify that inpatient services furnished can reasonably be expected to improve the patient's condition.    Waylan Boga, NP 9/19/20232:21 PM

## 2021-12-07 NOTE — Progress Notes (Signed)
Called [facility] [employer] at [phone] and [phone] and left a message that we have a transportation need and to please return call. I marked call as urgent.

## 2021-12-07 NOTE — Group Note (Signed)
[facility] LCSW Group Therapy Note   Group Date: 12/07/2021 Start Time: 1330 End Time: 1430  Type of Therapy/Topic:  Group Therapy:  Feelings about Diagnosis  Participation Level:  None   Description of Group:    This group will allow patients to explore their thoughts and feelings about diagnoses they have received. Patients will be guided to explore their level of understanding and acceptance of these diagnoses. Facilitator will encourage patients to process their thoughts and feelings about the reactions of others to their diagnosis, and will guide patients in identifying ways to discuss their diagnosis with significant others in their lives. This group will be process-oriented, with patients participating in exploration of their own experiences as well as giving and receiving support and challenge from other group members.   Therapeutic Goals: 1. Patient will demonstrate understanding of diagnosis as evidence by identifying two or more symptoms of the disorder:  2. Patient will be able to express two feelings regarding the diagnosis 3. Patient will demonstrate ability to communicate their needs through discussion and/or role plays  Summary of Patient Progress: Patient attended the beginning of group. Patient complained of pain related to fractures and returned to her room under the supervision of her sitter.   Therapeutic Modalities:   Cognitive Behavioral Therapy Brief Therapy Feelings Identification    Rozann Lesches, LCSW

## 2021-12-07 NOTE — Plan of Care (Signed)

## 2021-12-07 NOTE — Progress Notes (Signed)
Called [facility] [employer] and they are in shift change report but I was told a driver will be here as soon as they can

## 2021-12-07 NOTE — Tx Team (Signed)
Initial Treatment Plan 12/07/2021 11:55 AM Tina Bailey KZG:[id number]    PATIENT STRESSORS: Health problems     PATIENT STRENGTHS: Supportive family/friends    PATIENT IDENTIFIED PROBLEMS: Pain                     DISCHARGE CRITERIA:  Ability to meet basic life and health needs Improved stabilization in mood, thinking, and/or behavior  PRELIMINARY DISCHARGE PLAN: Return to previous living arrangement Return to previous work or school arrangements  PATIENT/FAMILY INVOLVEMENT: This treatment plan has been presented to and reviewed with the patient, Tina Bailey. The patient has been given the opportunity to ask questions and make suggestions.  Nolon Bussing, RN 12/07/2021, 11:55 AM

## 2021-12-07 NOTE — Progress Notes (Signed)
Charge nurse Venus Tolson, RN stated that security would be tied up with the patient if she arrived before 0700 due to staffing. I called [facility] [employer] back and they stated to call them back at 0700.

## 2021-12-07 NOTE — Progress Notes (Signed)
Chart reviewed; no overnight events.  Patient is hemodynamically stable to be discharged to behavioral health facility at [facility].  Please refer to discharge summary written on 12/07/2021.  Barton Dubois MD [phone]

## 2021-12-07 NOTE — Plan of Care (Signed)

## 2021-12-07 NOTE — Progress Notes (Signed)
Patient admitted voluntary to [facility] from  [facility] with diagnosis of worsening depression. She attempted suicide by taking 24 Excedrin pills.Patient presents to unit via [place] A[facility]Ox[facility]. Patient states, "there has to be something beyond the trees." Patient's affect is sullen. speech is clear and thoughts are organized. Patient denies depression and anxiety at this time. Patient currently denies suicidal ideations, homicidal ideations, audio or visual hallucinations and verbally contracts for safety on unit. Patient has a 1:1 sitter at bedside due to suicidal ideation and being on oxygen 2L continuously. Reports chronic back pain 3/10. Pt reports generalized weakness and using cane at home. Patient denies a poor appetite or difficulty with eating. Patient denies smoking or drinking alcohol.  Emotional support and reassurance provided throughout admission intake. Afterwards, oriented patient to unit, room and call light, reviewed POC with all questions answered and understanding verbalzied. Pt ate 100% of lunch.  Placed pt on high risk fall precautions per policy and also has her own rolling walker for ambulation.  Denies any needs at this time.  Will continue to monitor with ongoing Q 15 minute safety checks per unit protocol.

## 2021-12-07 NOTE — Progress Notes (Signed)
[facility] called and stated they could pick the patient up within the hour.

## 2021-12-07 NOTE — Progress Notes (Signed)
Assisted to [facility] for transport to ED entrance to transfer to [facility] by [facility].  O2 tank placed in car along with patient's belonging bag and rolling walker.

## 2021-12-07 NOTE — Progress Notes (Signed)
Pt was accept to [facility] 12/07/21; Bed Assignment LL 37   Pt meets inpatient criteria per Vesta Mixer, NP  Attending Physician will be Dr. Weber Cooks  Report can be called to: [phone]   Pt can arrive BED IS READY  Care Team notified: [facility], RN, Lynford Citizen, RN ,and Grace Isaac, RN   Nadara Mode, [place] 12/07/2021 @ 2:02 AM

## 2021-12-07 NOTE — [facility] Suicide Risk Assessment (Signed)
[facility] Admission Suicide Risk Assessment   Nursing information obtained from:  Patient Demographic factors:  Age 76 or older, Divorced or widowed, Caucasian, Unemployed Current Mental Status:  NA Loss Factors:  NA Historical Factors:  Prior suicide attempts Risk Reduction Factors:  NA  Total Time spent with patient: 1 hour Principal Problem: Severe recurrent major depression without psychotic features ([place]) Diagnosis:  Principal Problem:   Severe recurrent major depression without psychotic features ([place]) Active Problems:   Major depressive disorder, recurrent severe without psychotic features ([place])  Subjective Data: Client took an overdose of Excedrin at the beginning of September to end her chronic pain, suicide attempt.  Today, she denies suicidal ideations and depression.  No concerns noted  Continued Clinical Symptoms:  Alcohol Use Disorder Identification Test Final Score (AUDIT): 0 The "Alcohol Use Disorders Identification Test", Guidelines for Use in Primary Care, Second Edition.  World Pharmacologist [facility]). Score between 0-7:  no or low risk or alcohol related problems. Score between 8-15:  moderate risk of alcohol related problems. Score between 16-19:  high risk of alcohol related problems. Score 20 or above:  warrants further diagnostic evaluation for alcohol dependence and treatment.   CLINICAL FACTORS:  Impulsive, chronic pain   Musculoskeletal: Strength & Muscle Tone: decreased Gait & Station: normal Patient leans: N/A   Psychiatric Specialty Exam: Physical Exam Vitals and nursing note reviewed.  Constitutional:      Appearance: Normal appearance.  HENT:     Head: Normocephalic.     Nose: Nose normal.  Pulmonary:     Effort: Pulmonary effort is normal.  Musculoskeletal:        General: Normal range of motion.     Cervical back: Normal range of motion.  Neurological:     General: No focal deficit present.     Mental Status: She is alert and oriented  to person, place, and time.  Psychiatric:        Attention and Perception: Attention and perception normal.        Mood and Affect: Mood is anxious and depressed.        Speech: Speech normal.        Behavior: Behavior normal. Behavior is cooperative.        Thought Content: Thought content normal.        Cognition and Memory: Cognition and memory normal.        Judgment: Judgment normal.       Review of Systems  Musculoskeletal:  Positive for back pain.  Psychiatric/Behavioral:  Positive for depression. The patient is nervous/anxious.   All other systems reviewed and are negative.    Blood pressure 96/61, pulse 65, temperature 98.6 F (37 C), temperature source Oral, resp. rate 19, height 5' (1.524 m), weight 57 kg, SpO2 100 %.Body mass index is 24.54 kg/m.  General Appearance: Casual  Eye Contact:  Good  Speech:  Normal Rate  Volume:  Normal  Mood:  Anxious and Depressed  Affect:  Congruent  Thought Process:  Coherent  Orientation:  Full (Time, Place, and Person)  Thought Content:  WDL and Logical  Suicidal Thoughts:  No  Homicidal Thoughts:  No  Memory:  Immediate;   Fair Recent;   Fair Remote;   Fair  Judgement:  Poor  Insight:  Good  Psychomotor Activity:  Decreased  Concentration:  Concentration: Good and Attention Span: Good  Recall:  Good  Fund of Knowledge:  Good  Language:  Good  Akathisia:  No  Handed:  Right  AIMS (if indicated):     Assets:  Housing Leisure Time Resilience Social Support; daughter [name]  ADL's:  Intact  Cognition:  WNL  Sleep:   poor      Physical Exam: Physical Exam Vitals and nursing note reviewed.  Constitutional:      Appearance: Normal appearance.  HENT:     Head: Normocephalic.     Nose: Nose normal.  Pulmonary:     Effort: Pulmonary effort is normal.  Musculoskeletal:     Cervical back: Normal range of motion.  Neurological:     General: No focal deficit present.     Mental Status: She is alert.  Psychiatric:         Attention and Perception: Attention and perception normal.        Mood and Affect: Mood is anxious and depressed.        Speech: Speech normal.        Behavior: Behavior normal. Behavior is cooperative.        Thought Content: Thought content normal.        Cognition and Memory: Cognition and memory normal.        Judgment: Judgment is impulsive.    Review of Systems  Musculoskeletal:  Positive for back pain.  Psychiatric/Behavioral:  Positive for depression. The patient is nervous/anxious.   All other systems reviewed and are negative.  Blood pressure 96/61, pulse 62, temperature 98.6 F (37 C), temperature source Oral, resp. rate 19, height 5' (1.524 m), weight 57 kg, SpO2 97 %. Body mass index is 24.54 kg/m.   COGNITIVE FEATURES THAT CONTRIBUTE TO RISK:  None    SUICIDE RISK:   Minimal: No identifiable suicidal ideation.  Patients presenting with no risk factors but with morbid ruminations; may be classified as minimal risk based on the severity of the depressive symptoms  PLAN OF CARE:  Major depressive disorder, recurrent, severe: Prozac 20 mg daily  Anxiety: Klonopin 0.5 daily PRN  I certify that inpatient services furnished can reasonably be expected to improve the patient's condition.   Waylan Boga, NP 12/07/2021, 3:03 PM

## 2021-12-08 DIAGNOSIS — F332 Major depressive disorder, recurrent severe without psychotic features: Secondary | ICD-10-CM | POA: Diagnosis not present

## 2021-12-08 MED ORDER — HYDROCODONE-ACETAMINOPHEN 10-325 MG PO TABS
1.0000 | ORAL_TABLET | Freq: Four times a day (QID) | ORAL | Status: DC
  Administered 2021-12-08 – 2021-12-17 (×34): 1 via ORAL
  Filled 2021-12-08 (×35): qty 1

## 2021-12-08 NOTE — Progress Notes (Signed)
Patient denies SI, HI, and AVH. Affect is flat and depressed. Her main complaint is her back pain, which she rates as a 8/10. After Norco PRN was given at 0807, patient rates pain as a 7/10. Tylenol Prn was given at 0947, with no relief. Patient also states that she is freezing cold despite having a sweater and turning the heat up in her room. Patient is compliant with scheduled medications. She is isolative to her room, coming out only for meals. Patient remains on 1:1 safety sitter due to oxygen needs.

## 2021-12-08 NOTE — BHH Counselor (Signed)
CSW spoke with [facility], the patient's [place].    Per the ALF, patient has been discharged from their facility.  Patient is UNABLE to return due to "suicide attempt".  Staff report that the patient and her daughter have gathered the patient's belongings.   Staff unsure if patient can be eligible for readmission due to suicide attempt, however, report it is unlikely.  Assunta Curtis, MSW, LCSW 12/08/2021 3:06 PM

## 2021-12-08 NOTE — [facility] Suicide Risk Assessment (Signed)
[facility] INPATIENT:  Family/Significant Other Suicide Prevention Education  Suicide Prevention Education:  Contact Attempts: [name], daughter, [phone] has been identified by the patient as the family member/significant other with whom the patient will be residing, and identified as the person(s) who will aid the patient in the event of a mental health crisis.  With written consent from the patient, two attempts were made to provide suicide prevention education, prior to and/or following the patient's discharge.  We were unsuccessful in providing suicide prevention education.  A suicide education pamphlet was given to the patient to share with family/significant other.  Date and time of first attempt: 12/08/2021 2:40PM Date and time of second attempt: Second attempt is needed.  CSW left HIPAA compliant voicemail.  Rozann Lesches 12/08/2021, 2:41 PM
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
## 2021-12-08 NOTE — Progress Notes (Addendum)
1:1 hourly rounding  7a: Patient is resting in bed with 1:1 safety sitter. Respirations even and unlabored.  8a: Patient went to the restroom and walked to the dayroom for breakfast. Patient ate her oatmeal but did not want the rest of her food.   9a: Patient returned to her room with 1:1 Air cabin crew. Patient laying in bed. Respirations even and unlabored.  10a: Patient walked to treatment team room. Patient does not have any mental health goals to share with the team.  11a: Patient walked with 1:1 sitter to the dayroom to eat lunch.   12p: Patient laying in bed with 1:1 sitter at her side. Respirations even and unlabored.  1p: Patient complaining of pain while laying in bed. Patient educated that she has already received her pain medication.  2p: Patient stated she was cold and wanted something warm. Patient given cup of warm water and hot chocolate. 1:1 sitter remains at bedside.  3p: Patient is in the courtyard with Air cabin crew. Patient is animated and appears to be in better spirits.   4p: Patient walked with 1:1 sitter to the dayroom for dinner. Patient states that her pain level is a 3 and that she is feeling better.  5p: Patient returned to her room with 1:1 sitter. Patient laying in bed. Respirations even and unlabored.  6p: Patient states that she is too hot and does not feel well, requesting vital signs be taken. Vitals were WNL.

## 2021-12-08 NOTE — Progress Notes (Addendum)
[facility] MD Progress Note  12/08/2021 9:58 AM Tina Bailey  MRN:  [id number]  Subjective:  Client minimizes her depression and anxiety when met with in treatment team.  Yet, told the student that she is "just miserable" because of her back pain, Norco x 4 per day as ordered by her MD. This is her second overdose and states, "I've learned my lesson. It has caused me more issues than anything else."  Her focus is on finding a new facility to live and her daughter is actively looking.  Denies depression and anxiety, reports sleep as "good", appetite is fair.  She engages easily in conversation and feels her adult children are supportive.   Principal Problem: Severe recurrent major depression without psychotic features ([place]) Diagnosis: Principal Problem:   Severe recurrent major depression without psychotic features ([place]) Active Problems:   Major depressive disorder, recurrent severe without psychotic features ([place])  Total Time spent with patient: 30 minutes  Past Psychiatric History: depression and anxiety  Past Medical History:  Past Medical History:  Diagnosis Date   Arthritis    DDD. Right shoulder"is frozen"-limited ROM. osteoporosis.   Blue toe syndrome ([place]) 11/27/2014   Dr. Claudia Pollock evaluating   Chronic diastolic CHF (congestive heart failure) (HCC)    Chronic respiratory failure (HCC)    Complication of anesthesia    COPD (chronic obstructive pulmonary disease) ([place])    Fibromyalgia    Fracture of rib of right side    hx "osteoporosis"- states her dog nudge her on the side, next day developed great pain and was told has a fracture rib.   Liver cancer ([place]) 12/2014   Macular degeneration    R eye   Osteoporosis    PONV (postoperative nausea and vomiting)    nausea,severe vomiting after 01-13-15 portal vein embolization   Productive cough    Retina disorder    L eye, vision distorted, edema   Right atrial mass    Spine fracture due to birth trauma    Thoracic aortic  aneurysm (TAA) ([place])    TMJ disease    Wears glasses     Past Surgical History:  Procedure Laterality Date   ANGIOPLASTY  2008   no stents required, no follow-up with cardiologist, no recurrent chest pain   CATARACT EXTRACTION, BILATERAL Bilateral    ESOPHAGOGASTRODUODENOSCOPY (EGD) WITH PROPOFOL N/A 06/25/2015   Procedure: ESOPHAGOGASTRODUODENOSCOPY (EGD) WITH PROPOFOL;  Surgeon: Milus Banister, MD;  Location: [facility];  Service: Endoscopy;  Laterality: N/A;  stent removal    EYE SURGERY     cornea surgery   INTRAMEDULLARY (IM) NAIL INTERTROCHANTERIC Right 12/04/2017   Procedure: INTRAMEDULLARY (IM) NAIL INTERTROCHANTRIC;  Surgeon: Rod Can, MD;  Location: [facility];  Service: Orthopedics;  Laterality: Right;   IR RADIOLOGIST EVAL & MGMT  03/29/2017   KIDNEY STONE SURGERY     LAPAROSCOPIC PARTIAL HEPATECTOMY N/A 03/04/2015   Procedure: DIAGNOSTIC LAPAROSCOPY, EXTENDED RIGHT HEPATECTOMY, WITH INTRAOPERATIVE ULTRASOUND;  Surgeon: Stark Klein, MD;  Location: [facility];  Service: General;  Laterality: N/A;   PARTIAL HYSTERECTOMY     portal vein embolization     01-13-15 -Dr. Cathlean Sauer.   Family History:  Family History  Problem Relation Age of Onset   Hypertension Mother    Atrial fibrillation Mother    Heart disease Mother        after age 66   Breast cancer Mother    Stroke Father    Dementia Father    Peptic Ulcer Father  Heart disease Brother        After age 23- A-Fib   Heart attack Brother    Family Psychiatric  History: see above Social History:  Social History   Substance and Sexual Activity  Alcohol Use No   Alcohol/week: 0.0 standard drinks of alcohol     Social History   Substance and Sexual Activity  Drug Use No    Social History   Socioeconomic History   Marital status: Divorced    Spouse name: Not on file   Number of children: 3   Years of education: Not on file   Highest education level: Not on file  Occupational History    Occupation: retired  Tobacco Use   Smoking status: Former    Packs/day: 1.00    Years: 45.00    Total pack years: 45.00    Types: Cigarettes    Quit date: 02/20/2018    Years since quitting: 3.8   Smokeless tobacco: Never  Vaping Use   Vaping Use: Never used  Substance and Sexual Activity   Alcohol use: No    Alcohol/week: 0.0 standard drinks of alcohol   Drug use: No   Sexual activity: Not Currently  Other Topics Concern   Not on file  Social History Narrative   Not on file   Social Determinants of Health   Financial Resource Strain: Not on file  Food Insecurity: No Food Insecurity (12/07/2021)   Hunger Vital Sign    Worried About Running Out of Food in the Last Year: Never true    Ran Out of Food in the Last Year: Never true  Transportation Needs: No Transportation Needs (12/07/2021)   PRAPARE - Hydrologist (Medical): No    Lack of Transportation (Non-Medical): No  Physical Activity: Not on file  Stress: Not on file  Social Connections: Not on file   Additional Social History: has a place to live       Sleep: Good  Appetite:  Fair  Current Medications: Current Facility-Administered Medications  Medication Dose Route Frequency Provider Last Rate Last Admin   acetaminophen (TYLENOL) tablet 650 mg  650 mg Oral Q6H PRN Patrecia Pour, NP   650 mg at 12/08/21 0947   alum & mag hydroxide-simeth (MAALOX/MYLANTA) 200-200-20 MG/5ML suspension 30 mL  30 mL Oral Q4H PRN Patrecia Pour, NP       clonazePAM Bobbye [place]) tablet 0.5 mg  0.5 mg Oral Daily PRN Patrecia Pour, NP   0.5 mg at 12/07/21 2008   FLUoxetine (PROZAC) capsule 20 mg  20 mg Oral Daily Patrecia Pour, NP   20 mg at 12/08/21 8250   gabapentin (NEURONTIN) capsule 300 mg  300 mg Oral QHS Patrecia Pour, NP   300 mg at 12/07/21 2227   HYDROcodone-acetaminophen (NORCO) 10-325 MG per tablet 1 tablet  1 tablet Oral TID PRN Patrecia Pour, NP   1 tablet at 12/08/21 0370   magnesium  hydroxide (MILK OF MAGNESIA) suspension 30 mL  30 mL Oral Daily PRN Patrecia Pour, NP       metoprolol tartrate (LOPRESSOR) tablet 12.5 mg  12.5 mg Oral BID Patrecia Pour, NP   12.5 mg at 12/08/21 4888    Lab Results: No results found for this or any previous visit (from the past 48 hour(s)).  Blood Alcohol level:  Lab Results  Component Value Date   ETH <10 11/22/2021   ETH <10 07/25/2021  Metabolic Disorder Labs: Lab Results  Component Value Date   HGBA1C 6.2 05/24/2021   No results found for: "PROLACTIN" Lab Results  Component Value Date   CHOL 190 05/24/2021   TRIG 65.0 05/24/2021   HDL 90.80 05/24/2021   CHOLHDL 2 05/24/2021   VLDL 13.0 05/24/2021   LDLCALC 86 05/24/2021   LDLCALC 82 01/09/2017    Physical Findings: AIMS:  , ,  ,  ,    CIWA:    COWS:     Musculoskeletal: Strength & Muscle Tone: decreased Gait & Station: normal Patient leans: N/A  Psychiatric Specialty Exam: Physical Exam Vitals and nursing note reviewed.  Constitutional:      Appearance: Normal appearance.  HENT:     Head: Normocephalic.     Nose: Nose normal.  Pulmonary:     Effort: Pulmonary effort is normal.  Musculoskeletal:        General: Normal range of motion.     Cervical back: Normal range of motion.  Neurological:     General: No focal deficit present.     Mental Status: She is alert and oriented to person, place, and time.  Psychiatric:        Attention and Perception: Attention and perception normal.        Mood and Affect: Mood is anxious and depressed.        Speech: Speech normal.        Behavior: Behavior normal. Behavior is cooperative.        Thought Content: Thought content normal.        Cognition and Memory: Cognition and memory normal.        Judgment: Judgment normal.     Review of Systems  Psychiatric/Behavioral:  Positive for depression. The patient is nervous/anxious.   All other systems reviewed and are negative.   Blood pressure 111/63,  pulse 86, temperature 98.6 F (37 C), temperature source Oral, resp. rate 18, height 5' (1.524 m), weight 57 kg, SpO2 100 %.Body mass index is 24.54 kg/m.  General Appearance: Casual  Eye Contact:  Good  Speech:  Normal Rate  Volume:  Normal  Mood:  Euthymic  Affect:  Appropriate  Thought Process:  Coherent  Orientation:  Full (Time, Place, and Person)  Thought Content:  Logical  Suicidal Thoughts:  No  Homicidal Thoughts:  No  Memory:  Recent;   Good  Judgement:  Fair  Insight:  Fair  Psychomotor Activity:   uses walker  Concentration:  Concentration: Good and Attention Span: Good  Recall:  NA  Fund of Knowledge:  Fair  Language:  Good  Akathisia:  No  Handed:  Right  AIMS (if indicated):     Assets:  Social Support  ADL's:  Intact  Cognition:  WNL  Sleep:         Physical Exam: Physical Exam Vitals and nursing note reviewed.  Constitutional:      Appearance: Normal appearance.  HENT:     Head: Normocephalic.     Nose: Nose normal.  Pulmonary:     Effort: Pulmonary effort is normal.  Musculoskeletal:        General: Normal range of motion.     Cervical back: Normal range of motion.  Neurological:     General: No focal deficit present.     Mental Status: She is alert and oriented to person, place, and time.  Psychiatric:        Attention and Perception: Attention and perception normal.  Mood and Affect: Mood is anxious and depressed.        Speech: Speech normal.        Behavior: Behavior normal. Behavior is cooperative.        Thought Content: Thought content normal.        Cognition and Memory: Cognition and memory normal.        Judgment: Judgment normal.    Review of Systems  Psychiatric/Behavioral:  Positive for depression. The patient is nervous/anxious.   All other systems reviewed and are negative.  Blood pressure 111/63, pulse 86, temperature 98.6 F (37 C), temperature source Oral, resp. rate 18, height 5' (1.524 m), weight 57 kg, SpO2  100 %. Body mass index is 24.54 kg/m.   Treatment Plan Summary: Daily contact with patient to assess and evaluate symptoms and progress in treatment, Medication management, and Plan : Major depressive disorder, recurrent, severe: Prozac 20 mg daily  General anxiety disorder: Klonopin 0.5 mg daily PRN Gabapentin 300 mg at bedtime  Waylan Boga, NP 12/08/2021, 9:58 AM

## 2021-12-08 NOTE — BH IP Treatment Plan (Signed)
Interdisciplinary Treatment and Diagnostic Plan Update  12/08/2021 Time of Session: 10:00AM Tina Bailey MRN: [id number]  Principal Diagnosis: Severe recurrent major depression without psychotic features [facility])  Secondary Diagnoses: Principal Problem:   Severe recurrent major depression without psychotic features ([place]) Active Problems:   Major depressive disorder, recurrent severe without psychotic features ([place])   Current Medications:  Current Facility-Administered Medications  Medication Dose Route Frequency Provider Last Rate Last Admin   acetaminophen (TYLENOL) tablet 650 mg  650 mg Oral Q6H PRN Patrecia Pour, NP   650 mg at 12/08/21 0947   alum & mag hydroxide-simeth (MAALOX/MYLANTA) 200-200-20 MG/5ML suspension 30 mL  30 mL Oral Q4H PRN Patrecia Pour, NP       clonazePAM Bobbye [place]) tablet 0.5 mg  0.5 mg Oral Daily PRN Patrecia Pour, NP   0.5 mg at 12/07/21 2008   FLUoxetine (PROZAC) capsule 20 mg  20 mg Oral Daily Patrecia Pour, NP   20 mg at 12/08/21 3832   gabapentin (NEURONTIN) capsule 300 mg  300 mg Oral QHS Patrecia Pour, NP   300 mg at 12/07/21 2227   HYDROcodone-acetaminophen (NORCO) 10-325 MG per tablet 1 tablet  1 tablet Oral QID Patrecia Pour, NP   1 tablet at 12/08/21 1506   magnesium hydroxide (MILK OF MAGNESIA) suspension 30 mL  30 mL Oral Daily PRN Patrecia Pour, NP   30 mL at 12/08/21 1335   metoprolol tartrate (LOPRESSOR) tablet 12.5 mg  12.5 mg Oral BID Patrecia Pour, NP   12.5 mg at 12/08/21 0807   PTA Medications: Medications Prior to Admission  Medication Sig Dispense Refill Last Dose   Budeson-Glycopyrrol-Formoterol (BREZTRI AEROSPHERE) 160-9-4.8 MCG/ACT AERO Take 2 puffs first thing in am and then another 2 puffs about 12 hours later. 5.9 g 11    Cholecalciferol (VITAMIN D3) 50 MCG (2000 UT) capsule Take 2,000 Units by mouth daily after lunch.      clonazePAM (KLONOPIN) 0.5 MG tablet Take 1 tablet (0.5 mg total) by mouth every 12 (twelve)  hours as needed (Anxiety). 30 tablet 0    FLUoxetine (PROZAC) 20 MG capsule Take 1 capsule (20 mg total) by mouth daily. (Patient taking differently: Take 60 mg by mouth daily.) 30 capsule 3    gabapentin (NEURONTIN) 300 MG capsule Take 1 capsule (300 mg total) by mouth at bedtime. 30 capsule 3    HYDROcodone-acetaminophen (NORCO) 10-325 MG tablet Take 1 tablet by mouth in the morning, at noon, in the evening, and at bedtime.      metoprolol tartrate (LOPRESSOR) 25 MG tablet Take 0.5 tablets (12.5 mg total) by mouth 2 (two) times daily. 60 tablet 2    mirtazapine (REMERON) 15 MG tablet Take 1 tablet (15 mg total) by mouth at bedtime. (Patient not taking: Reported on 11/23/2021) 30 tablet 3    Multiple Vitamin (MULTIVITAMIN) tablet Take 1 tablet by mouth daily.      omeprazole (PRILOSEC) 20 MG capsule Take 20 mg by mouth daily. (Patient not taking: Reported on 11/23/2021)      OXYGEN Inhale 2 L into the lungs continuous.      polyethylene glycol (MIRALAX / GLYCOLAX) 17 g packet Take 17 g by mouth daily as needed for mild constipation. 14 each 0    risperiDONE (RISPERDAL) 0.25 MG tablet Take 1 tablet (0.25 mg total) by mouth 2 (two) times daily at 8 am and 4 pm. (Patient not taking: Reported on 11/23/2021) 60 tablet 3  senna (SENOKOT) 8.6 MG TABS tablet Take 1 tablet by mouth daily.       Patient Stressors: Health problems    Patient Strengths: Supportive family/friends   Treatment Modalities: Medication Management, Group therapy, Case management,  1 to 1 session with clinician, Psychoeducation, Recreational therapy.   Physician Treatment Plan for Primary Diagnosis: Severe recurrent major depression without psychotic features ([place]) Long Term Goal(s): Improvement in symptoms so as ready for discharge   Short Term Goals: Ability to identify changes in lifestyle to reduce recurrence of condition will improve Ability to verbalize feelings will improve Ability to disclose and discuss suicidal  ideas Ability to demonstrate self-control will improve Ability to identify and develop effective coping behaviors will improve Ability to maintain clinical measurements within normal limits will improve Compliance with prescribed medications will improve Ability to identify triggers associated with substance abuse/mental health issues will improve  Medication Management: Evaluate patient's response, side effects, and tolerance of medication regimen.  Therapeutic Interventions: 1 to 1 sessions, Unit Group sessions and Medication administration.  Evaluation of Outcomes: Not Met  Physician Treatment Plan for Secondary Diagnosis: Principal Problem:   Severe recurrent major depression without psychotic features ([place]) Active Problems:   Major depressive disorder, recurrent severe without psychotic features ([place])  Long Term Goal(s): Improvement in symptoms so as ready for discharge   Short Term Goals: Ability to identify changes in lifestyle to reduce recurrence of condition will improve Ability to verbalize feelings will improve Ability to disclose and discuss suicidal ideas Ability to demonstrate self-control will improve Ability to identify and develop effective coping behaviors will improve Ability to maintain clinical measurements within normal limits will improve Compliance with prescribed medications will improve Ability to identify triggers associated with substance abuse/mental health issues will improve     Medication Management: Evaluate patient's response, side effects, and tolerance of medication regimen.  Therapeutic Interventions: 1 to 1 sessions, Unit Group sessions and Medication administration.  Evaluation of Outcomes: Not Met   RN Treatment Plan for Primary Diagnosis: Severe recurrent major depression without psychotic features ([place]) Long Term Goal(s): Knowledge of disease and therapeutic regimen to maintain health will improve  Short Term Goals: Ability to  verbalize frustration and anger appropriately will improve, Ability to demonstrate self-control, Ability to participate in decision making will improve, Ability to verbalize feelings will improve, Ability to disclose and discuss suicidal ideas, Ability to identify and develop effective coping behaviors will improve, and Compliance with prescribed medications will improve  Medication Management: RN will administer medications as ordered by provider, will assess and evaluate patient's response and provide education to patient for prescribed medication. RN will report any adverse and/or side effects to prescribing provider.  Therapeutic Interventions: 1 on 1 counseling sessions, Psychoeducation, Medication administration, Evaluate responses to treatment, Monitor vital signs and CBGs as ordered, Perform/monitor CIWA, COWS, AIMS and Fall Risk screenings as ordered, Perform wound care treatments as ordered.  Evaluation of Outcomes: Not Met   LCSW Treatment Plan for Primary Diagnosis: Severe recurrent major depression without psychotic features ([place]) Long Term Goal(s): Safe transition to appropriate next level of care at discharge, Engage patient in therapeutic group addressing interpersonal concerns.  Short Term Goals: Engage patient in aftercare planning with referrals and resources, Increase social support, Increase ability to appropriately verbalize feelings, Increase emotional regulation, Facilitate acceptance of mental health diagnosis and concerns, and Increase skills for wellness and recovery  Therapeutic Interventions: Assess for all discharge needs, 1 to 1 time with Social worker, Explore available resources and  support systems, Assess for adequacy in community support network, Educate family and significant other(s) on suicide prevention, Complete Psychosocial Assessment, Interpersonal group therapy.  Evaluation of Outcomes: Not Met   Progress in Treatment: Attending groups:  No. Participating in groups: No. Taking medication as prescribed: Yes. Toleration medication: Yes. Family/Significant other contact made: No, will contact:  SPE attempts have been made.  Patient understands diagnosis: Yes. Discussing patient identified problems/goals with staff: Yes. Medical problems stabilized or resolved: Yes. Denies suicidal/homicidal ideation: Yes. Issues/concerns per patient self-inventory: No. Other: none  New problem(s) identified: No, Describe:  none  New Short Term/Long Term Goal(s): medication management for mood stabilization; elimination of SI thoughts; development of comprehensive mental wellness/sobriety plan.   Patient Goals:  "to find out what makes me tick"  Discharge Plan or Barriers: CSW to assist patient in development of appropriate discharge plans.  At this time the patient is not able to return to her ALF.  Reason for Continuation of Hospitalization: Anxiety Depression Medical Issues Medication stabilization Suicidal ideation  Estimated Length of Stay:  1-7 days  Last [place] Suicide Severity Risk Score: Flowsheet Row Admission (Current) from 12/07/2021 in [place] ED to Hosp-Admission (Discharged) from 11/22/2021 in [place] [facility] ED from 07/25/2021 in [facility]PT  C-SSRS RISK CATEGORY No Risk Error: Q7 should not be populated when Q6 is No High Risk       Last PHQ 2/9 Scores:    11/23/2021   11:15 AM 04/22/2021    1:41 PM 04/19/2021    2:56 PM  Depression screen PHQ 2/9  Decreased Interest 3 0 0  Down, Depressed, Hopeless 3 0 0  PHQ - 2 Score 6 0 0  Altered sleeping 1 0 0  Tired, decreased energy 2 0 0  Change in appetite 3 0 0  Feeling bad or failure about yourself  3 0 0  Trouble concentrating 3 0 0  Moving slowly or fidgety/restless 1 0 0  Suicidal thoughts 3 0 0  PHQ-9 Score 22 0 0  Difficult doing work/chores Very difficult  Not difficult at all     Scribe for Treatment Team: Rozann Lesches, LCSW 12/08/2021 3:08 PM

## 2021-12-08 NOTE — Group Note (Signed)
[place] LCSW Group Therapy Note   Group Date: 12/08/2021 Start Time: 1330 End Time: 1430   Type of Therapy/Topic:  Group Therapy:  Emotion Regulation  Participation Level:  Did Not Attend   Mood:  Description of Group:    The purpose of this group is to assist patients in learning to regulate negative emotions and experience positive emotions. Patients will be guided to discuss ways in which they have been vulnerable to their negative emotions. These vulnerabilities will be juxtaposed with experiences of positive emotions or situations, and patients challenged to use positive emotions to combat negative ones. Special emphasis will be placed on coping with negative emotions in conflict situations, and patients will process healthy conflict resolution skills.  Therapeutic Goals: Patient will identify two positive emotions or experiences to reflect on in order to balance out negative emotions:  Patient will label two or more emotions that they find the most difficult to experience:  Patient will be able to demonstrate positive conflict resolution skills through discussion or role plays:   Summary of Patient Progress:   Patient declined to attend group, though personally invited by this clinician.    Therapeutic Modalities:   Cognitive Behavioral Therapy Feelings Identification Dialectical Behavioral Therapy   Rozann Lesches, LCSW

## 2021-12-09 DIAGNOSIS — F332 Major depressive disorder, recurrent severe without psychotic features: Secondary | ICD-10-CM | POA: Diagnosis not present

## 2021-12-09 NOTE — Evaluation (Signed)
Physical Therapy Evaluation Patient Details Name: Tina Bailey MRN: [id number] DOB: 1945/12/03 Today's Date: 12/09/2021  History of Present Illness  Pt admitted for severe recurrent depression. History includes COPD, fibromyalgia, CHF, and liver cancer. Pt here with suicide attempt.  Clinical Impression  Pt is a pleasant 76 year old female who was admitted for severe recurrent depression. Pt performs bed mobility/transfers with mod I and ambulation with supervision and rollator. She uses rollator and 2L of O2 at baseline and reports no recent falls. Pt very emotional during session reporting LBP that started from nurses sliding pt up in bed at another hospital stay. Encouraged pt to continue mobility to maintain indep. Educated Conservation officer, historic buildings as well that pt needs to continue to be mobile to assist with pain control. At this time, no PT needs identified. Ultimately, pt wishing to move to location near son, however would benefit from supervision with ADLs. Pt will be dc in house and does not require follow up. RN aware. Will dc current orders.       Recommendations for follow up therapy are one component of a multi-disciplinary discharge planning process, led by the attending physician.  Recommendations may be updated based on patient status, additional functional criteria and insurance authorization.  Follow Up Recommendations No PT follow up      Assistance Recommended at Discharge Set up Supervision/Assistance  Patient can return home with the following       Equipment Recommendations None recommended by PT  Recommendations for Other Services       Functional Status Assessment Patient has not had a recent decline in their functional status     Precautions / Restrictions Precautions Precautions: Fall Restrictions Weight Bearing Restrictions: No      Mobility  Bed Mobility Overal bed mobility: Modified Independent             General bed mobility comments: safe technique  with ease of effort    Transfers Overall transfer level: Modified independent Equipment used: Rollator (4 wheels)               General transfer comment: safe technique with upright posture. Safe with locking brakes prior to mobility.    Ambulation/Gait Ambulation/Gait assistance: Supervision Gait Distance (Feet): 50 Feet Assistive device: Rollator (4 wheels) Gait Pattern/deviations: Step-through pattern       General Gait Details: ambulated in room and hallway with safe technique with slight limp, reports she typically uses shoe lift. Pt crying intermittently throughout session regarding back pain. Reports no worse pain with ambulation vs supine. O2 sats on 95% on 2L of O2  Stairs            Wheelchair Mobility    Modified Rankin (Stroke Patients Only)       Balance Overall balance assessment: Modified Independent                                           Pertinent Vitals/Pain Pain Assessment Pain Assessment: Faces Faces Pain Scale: Hurts even more Pain Location: pain in R side abdomen and low back Pain Descriptors / Indicators: Aching, Moaning Pain Intervention(s): Limited activity within patient's tolerance, Repositioned    Home Living Family/patient expects to be discharged to:: Skilled nursing facility Living Arrangements:  (assisted living)                 Additional Comments: living at [facility]  Prior Function Prior Level of Function : Independent/Modified Independent             Mobility Comments: ambulatory with rollator and chronic O2 ADLs Comments: Modified independent     Hand Dominance        Extremity/Trunk Assessment   Upper Extremity Assessment Upper Extremity Assessment: Overall WFL for tasks assessed    Lower Extremity Assessment Lower Extremity Assessment: Overall WFL for tasks assessed       Communication   Communication: No difficulties  Cognition Arousal/Alertness:  Awake/alert Behavior During Therapy: Restless Overall Cognitive Status: Within Functional Limits for tasks assessed                                 General Comments: Pt very emotionally labile with frequent crying outburst.        General Comments      Exercises     Assessment/Plan    PT Assessment Patient does not need any further PT services  PT Problem List         PT Treatment Interventions      PT Goals (Current goals can be found in the Care Plan section)  Acute Rehab PT Goals Patient Stated Goal: to go live near her son PT Goal Formulation: All assessment and education complete, DC therapy Time For Goal Achievement: 12/09/21 Potential to Achieve Goals: Good    Frequency       Co-evaluation               AM-PAC PT "6 Clicks" Mobility  Outcome Measure Help needed turning from your back to your side while in a flat bed without using bedrails?: None Help needed moving from lying on your back to sitting on the side of a flat bed without using bedrails?: None Help needed moving to and from a bed to a chair (including a wheelchair)?: None Help needed standing up from a chair using your arms (e.g., wheelchair or bedside chair)?: None Help needed to walk in hospital room?: None Help needed climbing 3-5 steps with a railing? : A Little 6 Click Score: 23    End of Session Equipment Utilized During Treatment: Oxygen Activity Tolerance: Patient tolerated treatment well Patient left: in bed (with safety sitter) Nurse Communication: Mobility status PT Visit Diagnosis: Difficulty in walking, not elsewhere classified (R26.2)    Time: 7564-3329 PT Time Calculation (min) (ACUTE ONLY): 23 min   Charges:   PT Evaluation $PT Eval Low Complexity: 1 Low PT Treatments $Gait Training: 8-22 mins        Tina Bailey, PT, DPT, GCS [phone]   Tina Bailey 12/09/2021, 4:45 PM

## 2021-12-09 NOTE — Progress Notes (Signed)
[facility] MD Progress Note  12/09/2021 9:36 AM Tina Bailey  MRN:  [id number]  Subjective:  Pt is in poor spirits today, reporting she is "sorry for my life," states she has always felt a "sense of sadness," and reflecting on her childhood heavily. She is conversing well, tearful, and reflecting on religous beliefs throughout the conversation for strength. She appears depressed. When asked if she thinks of harming herself she responded she would "try not to", and when asked if  she had thoughts of suicide she responded "it's just hard." She denies anxiety or thoughts of homicide.   She again reports pain, chronic pain for years. She reports she slept horribly due to the cold temperature in her room. She is unsure of her appetite, reports she did not really eat breakfast.  She denies side effects  her medication. In terms of goals, she states she believes her daughter is trying to find her a place to stay near her son in [place].   Principal Problem: Severe recurrent major depression without psychotic features ([place]) Diagnosis: Principal Problem:   Severe recurrent major depression without psychotic features ([place]) Active Problems:   Major depressive disorder, recurrent severe without psychotic features ([place])  Total Time spent with patient: 30 minutes  Past Psychiatric History: depression and anxiety  Past Medical History:  Past Medical History:  Diagnosis Date   Arthritis    DDD. Right shoulder"is frozen"-limited ROM. osteoporosis.   Blue toe syndrome ([place]) 11/27/2014   Dr. Claudia Pollock evaluating   Chronic diastolic CHF (congestive heart failure) (HCC)    Chronic respiratory failure (HCC)    Complication of anesthesia    COPD (chronic obstructive pulmonary disease) ([place])    Fibromyalgia    Fracture of rib of right side    hx "osteoporosis"- states her dog nudge her on the side, next day developed great pain and was told has a fracture rib.   Liver cancer ([place]) 12/2014   Macular  degeneration    R eye   Osteoporosis    PONV (postoperative nausea and vomiting)    nausea,severe vomiting after 01-13-15 portal vein embolization   Productive cough    Retina disorder    L eye, vision distorted, edema   Right atrial mass    Spine fracture due to birth trauma    Thoracic aortic aneurysm (TAA) ([place])    TMJ disease    Wears glasses     Past Surgical History:  Procedure Laterality Date   ANGIOPLASTY  2008   no stents required, no follow-up with cardiologist, no recurrent chest pain   CATARACT EXTRACTION, BILATERAL Bilateral    ESOPHAGOGASTRODUODENOSCOPY (EGD) WITH PROPOFOL N/A 06/25/2015   Procedure: ESOPHAGOGASTRODUODENOSCOPY (EGD) WITH PROPOFOL;  Surgeon: Milus Banister, MD;  Location: [facility];  Service: Endoscopy;  Laterality: N/A;  stent removal    EYE SURGERY     cornea surgery   INTRAMEDULLARY (IM) NAIL INTERTROCHANTERIC Right 12/04/2017   Procedure: INTRAMEDULLARY (IM) NAIL INTERTROCHANTRIC;  Surgeon: Rod Can, MD;  Location: [facility];  Service: Orthopedics;  Laterality: Right;   IR RADIOLOGIST EVAL & MGMT  03/29/2017   KIDNEY STONE SURGERY     LAPAROSCOPIC PARTIAL HEPATECTOMY N/A 03/04/2015   Procedure: DIAGNOSTIC LAPAROSCOPY, EXTENDED RIGHT HEPATECTOMY, WITH INTRAOPERATIVE ULTRASOUND;  Surgeon: Stark Klein, MD;  Location: [facility];  Service: General;  Laterality: N/A;   PARTIAL HYSTERECTOMY     portal vein embolization     01-13-15 -Dr. Cathlean Sauer.   Family History:  Family History  Problem Relation Age of Onset   Hypertension Mother    Atrial fibrillation Mother    Heart disease Mother        after age 25   Breast cancer Mother    Stroke Father    Dementia Father    Peptic Ulcer Father    Heart disease Brother        After age 35- A-Fib   Heart attack Brother    Family Psychiatric  History: see above Social History:  Social History   Substance and Sexual Activity  Alcohol Use No   Alcohol/week: 0.0 standard drinks of alcohol      Social History   Substance and Sexual Activity  Drug Use No    Social History   Socioeconomic History   Marital status: Divorced    Spouse name: Not on file   Number of children: 3   Years of education: Not on file   Highest education level: Not on file  Occupational History   Occupation: retired  Tobacco Use   Smoking status: Former    Packs/day: 1.00    Years: 45.00    Total pack years: 45.00    Types: Cigarettes    Quit date: 02/20/2018    Years since quitting: 3.8   Smokeless tobacco: Never  Vaping Use   Vaping Use: Never used  Substance and Sexual Activity   Alcohol use: No    Alcohol/week: 0.0 standard drinks of alcohol   Drug use: No   Sexual activity: Not Currently  Other Topics Concern   Not on file  Social History Narrative   Not on file   Social Determinants of Health   Financial Resource Strain: Not on file  Food Insecurity: No Food Insecurity (12/07/2021)   Hunger Vital Sign    Worried About Running Out of Food in the Last Year: Never true    Ran Out of Food in the Last Year: Never true  Transportation Needs: No Transportation Needs (12/07/2021)   PRAPARE - Hydrologist (Medical): No    Lack of Transportation (Non-Medical): No  Physical Activity: Not on file  Stress: Not on file  Social Connections: Not on file   Additional Social History: has a place to live       Sleep: not well  Appetite:  Fair  Current Medications: Current Facility-Administered Medications  Medication Dose Route Frequency Provider Last Rate Last Admin   acetaminophen (TYLENOL) tablet 650 mg  650 mg Oral Q6H PRN Patrecia Pour, NP   650 mg at 12/08/21 0947   alum & mag hydroxide-simeth (MAALOX/MYLANTA) 200-200-20 MG/5ML suspension 30 mL  30 mL Oral Q4H PRN Patrecia Pour, NP       clonazePAM Bobbye [place]) tablet 0.5 mg  0.5 mg Oral Daily PRN Patrecia Pour, NP   0.5 mg at 12/07/21 2008   FLUoxetine (PROZAC) capsule 20 mg  20 mg Oral Daily  Patrecia Pour, NP   20 mg at 12/08/21 7124   gabapentin (NEURONTIN) capsule 300 mg  300 mg Oral QHS Patrecia Pour, NP   300 mg at 12/08/21 2119   HYDROcodone-acetaminophen (NORCO) 10-325 MG per tablet 1 tablet  1 tablet Oral QID Patrecia Pour, NP   1 tablet at 12/09/21 5809   magnesium hydroxide (MILK OF MAGNESIA) suspension 30 mL  30 mL Oral Daily PRN Patrecia Pour, NP   30 mL at 12/08/21 1335   metoprolol tartrate (LOPRESSOR) tablet 12.5 mg  12.5 mg Oral BID Patrecia Pour, NP   12.5 mg at 12/08/21 2119    Lab Results: No results found for this or any previous visit (from the past 48 hour(s)).  Blood Alcohol level:  Lab Results  Component Value Date   ETH <10 11/22/2021   ETH <10 22/29/7989    Metabolic Disorder Labs: Lab Results  Component Value Date   HGBA1C 6.2 05/24/2021   No results found for: "PROLACTIN" Lab Results  Component Value Date   CHOL 190 05/24/2021   TRIG 65.0 05/24/2021   HDL 90.80 05/24/2021   CHOLHDL 2 05/24/2021   VLDL 13.0 05/24/2021   LDLCALC 86 05/24/2021   LDLCALC 82 01/09/2017    Physical Findings: AIMS:  , ,  ,  ,    CIWA:    COWS:     Musculoskeletal: Strength & Muscle Tone: decreased Gait & Station: normal, uses walker  Patient leans: N/A  Psychiatric Specialty Exam: Physical Exam Vitals and nursing note reviewed.  Constitutional:      Appearance: Normal appearance.  HENT:     Head: Normocephalic.     Nose: Nose normal.  Pulmonary:     Effort: Pulmonary effort is normal.  Musculoskeletal:        General: Normal range of motion.     Cervical back: Normal range of motion.  Neurological:     General: No focal deficit present.     Mental Status: She is alert and oriented to person, place, and time.  Psychiatric:        Attention and Perception: Attention and perception normal.        Mood and Affect: Mood is anxious and depressed.        Speech: Speech normal.        Behavior: Behavior normal. Behavior is cooperative.         Thought Content: Thought content normal.        Cognition and Memory: Cognition and memory normal.        Judgment: Judgment normal.     Review of Systems  Psychiatric/Behavioral:  Positive for depression. The patient is nervous/anxious.   All other systems reviewed and are negative.   Blood pressure 103/60, pulse 70, temperature 98.5 F (36.9 C), resp. rate 16, height 5' (1.524 m), weight 57 kg, SpO2 100 %.Body mass index is 24.54 kg/m.  General Appearance: Casual  Eye Contact:  Good  Speech:  Normal Rate  Volume:  Normal  Mood:  Depressed   Affect:  Appropriate  Thought Process:  Coherent  Orientation:  Full (Time, Place, and Person)  Thought Content:  Logical  Suicidal Thoughts:  No, when asked reports "it's just hard"   Homicidal Thoughts:  No  Memory:  Recent;   Good  Judgement:  Fair  Insight:  Fair  Psychomotor Activity:   uses walker  Concentration:  Concentration: Good and Attention Span: Good  Recall:  NA  Fund of Knowledge:  Fair  Language:  Good  Akathisia:  No  Handed:  Right  AIMS (if indicated):     Assets:  Social Support  ADL's:  Intact  Cognition:  WNL  Sleep:         Physical Exam: Physical Exam Vitals and nursing note reviewed.  Constitutional:      Appearance: Normal appearance.  HENT:     Head: Normocephalic.     Nose: Nose normal.  Pulmonary:     Effort: Pulmonary effort is normal.  Musculoskeletal:  General: Normal range of motion.     Cervical back: Normal range of motion.  Neurological:     General: No focal deficit present.     Mental Status: She is alert and oriented to person, place, and time.  Psychiatric:        Attention and Perception: Attention and perception normal.        Mood and Affect: Mood is anxious and depressed.        Speech: Speech normal.        Behavior: Behavior normal. Behavior is cooperative.        Thought Content: Thought content normal.        Cognition and Memory: Cognition and memory  normal.        Judgment: Judgment normal.    Review of Systems  Psychiatric/Behavioral:  Positive for depression. The patient is nervous/anxious.   All other systems reviewed and are negative.  Blood pressure 103/60, pulse 70, temperature 98.5 F (36.9 C), resp. rate 16, height 5' (1.524 m), weight 57 kg, SpO2 100 %. Body mass index is 24.54 kg/m.   Treatment Plan Summary: Daily contact with patient to assess and evaluate symptoms and progress in treatment, Medication management, and Plan : Major depressive disorder, recurrent, severe: Prozac 20 mg daily  General anxiety disorder: Klonopin 0.5 mg daily PRN Gabapentin 300 mg at bedtime  Waylan Boga, NP 12/09/2021, 9:36 AM

## 2021-12-09 NOTE — Progress Notes (Signed)
1:1 hourly rounding  0700: Patient is resting in bed. No distress noted.  0800: Patient did not get up for breakfast because she says that she is cold. Heat is on in her room and patient has extra blankets. Medication administered routinely for 8/10 chronic back pain.  0900: Patient continues 1:1.  1000: Medication administered. Patient ambulating in the hallway with sitter.  1100: Patient is sitting in the dayroom with sitter.  1200: Lunch tray consumed. Patient ate about 50%. Medication administered.  1300: Patient is lying in bed with sitter present.  1400: Patient exits room with sitter and asks if the unit has chocolate because it makes her feel warm.  1500: Patient continues 1:1 in bedroom.  1600: Patient exits room and sits in the dayroom. Medication administered.  1700: Dinner consumed.  1800: Patient uses the pay phone.  1830: Patient's daughter is visiting and has brought her clothes.  Patient is A+O x 3. She denies SI/HI/AVH. She endorses pain 8/10. 1:1 order remains in place.

## 2021-12-09 NOTE — BH Assessment (Signed)
1910 Received patient sitting in the day room visiting with daughter. She is alert and oriented x3, denying SI/HI, A/V hallucinations, and depression. She is received oxygen at 2 liters via nasal cannula and portable tank. Patient is free form resp. distress and is engaged in conversation. Will continue to monitor patient for safety and good oxygen perfusion. One to one staff at bedside.  2130 Patient is able to walk using her walker without difficulty.02 infusing via nasal cannula at 2 liters. No signs of resp distress. Will continue to monitor patient for resp distress and safety.  2330 Patient in bed awake she is alert and responding to an unseen entity. One to one at bedside for safety. 02 infusing via nasal cannula without difficulty.   0130 Patient remains awake she was assisted to the bathroom. 02 in place but tube need to be increased to allow a safe passage to the bathroom. One to one at bedside for safety.   0330 Patient fell asleep around 0200 and has remained asleep. 02 in place and one to one staffing at bedside.   0530 Patient continue to rest with eyes closed. 02 in place at 2 liters via nasal cannula. No resp. or other distress noted One to one at bedside for safety.  [place]

## 2021-12-09 NOTE — Group Note (Signed)
[facility] LCSW Group Therapy Note   Group Date: 12/09/2021 Start Time: 1310 End Time: 1410   Type of Therapy/Topic:  Group Therapy:  Balance in Life  Participation Level:  Active   Description of Group:    This group will address the concept of balance and how it feels and looks when one is unbalanced. Patients will be encouraged to process areas in their lives that are out of balance, and identify reasons for remaining unbalanced. Facilitators will guide patients utilizing problem- solving interventions to address and correct the stressor making their life unbalanced. Understanding and applying boundaries will be explored and addressed for obtaining  and maintaining a balanced life. Patients will be encouraged to explore ways to assertively make their unbalanced needs known to significant others in their lives, using other group members and facilitator for support and feedback.  Therapeutic Goals: Patient will identify two or more emotions or situations they have that consume much of in their lives. Patient will identify signs/triggers that life has become out of balance:  Patient will identify two ways to set boundaries in order to achieve balance in their lives:  Patient will demonstrate ability to communicate their needs through discussion and/or role plays  Summary of Patient Progress:    Patient declined attend group despite being personally invited by this CSW.     Therapeutic Modalities:   Cognitive Behavioral Therapy Solution-Focused Therapy Assertiveness Training   Rozann Lesches, LCSW

## 2021-12-09 NOTE — BHH Counselor (Signed)
Daughter reports that the patient has attempted suicide previously, in April 2023.  She reports that she took the patient on vacation to visit with the patient's brother in [place].  She reports that once they returned from vacation the patient began to make comments about "I can die now" "I'm tired" "I've seen my brother and can die".  Daughter reports that she alerted the staff at the [facility].   Daughter reports that the went on vacation with her husband and when she returned she visited the patient and she was "talking off and had thrown up everywhere".  She reports that patient was sent to [facility] after she admitted that she had taken a bottle of her medication. Daughter reports "she hates [place], that she is in pain, that she was in [place]". Daughter reports that patient has been accepted to a facility in [place].  She reports that the name of the facility is [facility].  Facility is located in [place], [place], [phone].  She reports that they offer in house psych.  She reports that when patient is released she will need a day or two notice to make arrangements for a RV to take patient to [place].  Patients son lives there.  She reports that the preferred pharmacy is [facility] on [address] ([phone]   She requests that CSW meet with the patient one on one to discuss her thoughts/feelings about moving to [place].   Assunta Curtis, MSW, LCSW 12/09/2021 2:42 PM

## 2021-12-09 NOTE — BHH Suicide Risk Assessment (Signed)
[facility] INPATIENT:  Family/Significant Other Suicide Prevention Education  Suicide Prevention Education:  Education Completed; [name], daughter, [phone] has been identified by the patient as the family member/significant other with whom the patient will be residing, and identified as the person(s) who will aid the patient in the event of a mental health crisis (suicidal ideations/suicide attempt).  With written consent from the patient, the family member/significant other has been provided the following suicide prevention education, prior to the and/or following the discharge of the patient.  The suicide prevention education provided includes the following: Suicide risk factors Suicide prevention and interventions National Suicide Hotline telephone number [facility] assessment telephone number [facility] Emergency Assistance [place] and/or Residential Mobile Crisis Unit telephone number  Request made of family/significant other to: Remove weapons (e.g., guns, rifles, knives), all items previously/currently identified as safety concern.   Remove drugs/medications (over-the-counter, prescriptions, illicit drugs), all items previously/currently identified as a safety concern.  The family member/significant other verbalizes understanding of the suicide prevention education information provided.  The family member/significant other agrees to remove the items of safety concern listed above.    Rozann Lesches 12/09/2021, 2:13 PM

## 2021-12-10 DIAGNOSIS — F332 Major depressive disorder, recurrent severe without psychotic features: Secondary | ICD-10-CM | POA: Diagnosis not present

## 2021-12-10 NOTE — BHH Counselor (Signed)
CSW spoke with the patient about her move, at her daughters request.  Patient reports apprehension and unease at moving to an unknown area to an unknown facility.  Patient reports that she is aware that she has to move and is open to it despite her concerns.  Tina Bailey, MSW, LCSW 12/10/2021 3:04 PM

## 2021-12-10 NOTE — Progress Notes (Signed)
Patient remains on 1:1 for safety and redirection. She was cooperative  with treatment. Patient reports anxiety and depression. She needed reassurance that she was going to be alright when adjusting her oxygen from the tank. She seemed to sleep well through out the night.

## 2021-12-10 NOTE — BHH Group Notes (Signed)
Patient attended group therapy today.

## 2021-12-10 NOTE — Group Note (Addendum)
LCSW Group Therapy Note    Date/Time:  Mood: Type of Therapy and Topic: Group Therapy: Avoiding Self-Sabotaging and Enabling Behaviors Participation Level: Active Description of Group:  In this group, patients will learn how to identify obstacles, self-sabotaging and enabling behaviors, as well as: what are they, why do we do them and what needs these behaviors meet. Discuss unhealthy relationships and how to have positive healthy boundaries with those that sabotage and enable. Explore aspects of self-sabotage and enabling in yourself and how to limit these self-destructive behaviors in everyday life.   Therapeutic Goals: 1. Patient will identify one obstacle that relates to self-sabotage and enabling behaviors 2. Patient will identify one personal self-sabotaging or enabling behavior they did prior to admission 3. Patient will state a plan to change the above identified behavior 4. Patient will demonstrate ability to communicate their needs through discussion and/or role play.    Summary of Patient Progress: Patient was present in group and engaged in gorup discussion.  Patient was appropriate and on topic.  Patient shared her values.  Patient was supportive of others in group.    Therapeutic Modalities:  Cognitive Behavioral Therapy Person-Centered Therapy Motivational Interviewing    Assunta Curtis, MSW, LCSW 12/10/2021 3:02 PM

## 2021-12-10 NOTE — Progress Notes (Signed)
[facility] MD Progress Note  12/10/2021 10:44 AM Tina Bailey  MRN:  [id number]  Subjective:  Pt is in poor spirits today. She is conversing well, tearful, and reflecting on religous beliefs throughout the conversation for strength.  She reports feeling "terrible," is tired, cold, is in pain, and has a HA that feels like a "cold" is beginning. She appears depressed. When asked if she thinks of suicide she reports no, "it's too hard." She reports anxiety about all the change occurring in her life (upcoming move to [place]). She denies thoughts of homicide or hallucinations.   She reports her pain is the same level as it was yesterday. She reports she slept well last night, yet does not feel refreshed. She thinks the medication provided aided with her sleep. She denies any known side effects of her medication but states she is so cold and in pain she may not notice if they were occurring. She reports she was able to eat breakfast but notes her stomach is churning whenever she eats.   She seems optimistic yet anxious about her upcoming move to [place]. She notes not much contact with her son, possibly hasn't seen him in 73 years. She continues to gather strength from her religious beliefs.   Principal Problem: Severe recurrent major depression without psychotic features ([place]) Diagnosis: Principal Problem:   Severe recurrent major depression without psychotic features ([place]) Active Problems:   Major depressive disorder, recurrent severe without psychotic features ([place])  Total Time spent with patient: 30 minutes  Past Psychiatric History: depression and anxiety  Past Medical History:  Past Medical History:  Diagnosis Date   Arthritis    DDD. Right shoulder"is frozen"-limited ROM. osteoporosis.   Blue toe syndrome ([place]) 11/27/2014   Dr. Claudia Pollock evaluating   Chronic diastolic CHF (congestive heart failure) (HCC)    Chronic respiratory failure (HCC)    Complication of anesthesia    COPD  (chronic obstructive pulmonary disease) ([place])    Fibromyalgia    Fracture of rib of right side    hx "osteoporosis"- states her dog nudge her on the side, next day developed great pain and was told has a fracture rib.   Liver cancer ([place]) 12/2014   Macular degeneration    R eye   Osteoporosis    PONV (postoperative nausea and vomiting)    nausea,severe vomiting after 01-13-15 portal vein embolization   Productive cough    Retina disorder    L eye, vision distorted, edema   Right atrial mass    Spine fracture due to birth trauma    Thoracic aortic aneurysm (TAA) ([place])    TMJ disease    Wears glasses     Past Surgical History:  Procedure Laterality Date   ANGIOPLASTY  2008   no stents required, no follow-up with cardiologist, no recurrent chest pain   CATARACT EXTRACTION, BILATERAL Bilateral    ESOPHAGOGASTRODUODENOSCOPY (EGD) WITH PROPOFOL N/A 06/25/2015   Procedure: ESOPHAGOGASTRODUODENOSCOPY (EGD) WITH PROPOFOL;  Surgeon: Milus Banister, MD;  Location: [facility];  Service: Endoscopy;  Laterality: N/A;  stent removal    EYE SURGERY     cornea surgery   INTRAMEDULLARY (IM) NAIL INTERTROCHANTERIC Right 12/04/2017   Procedure: INTRAMEDULLARY (IM) NAIL INTERTROCHANTRIC;  Surgeon: Rod Can, MD;  Location: [facility];  Service: Orthopedics;  Laterality: Right;   IR RADIOLOGIST EVAL & MGMT  03/29/2017   KIDNEY STONE SURGERY     LAPAROSCOPIC PARTIAL HEPATECTOMY N/A 03/04/2015   Procedure: DIAGNOSTIC LAPAROSCOPY, EXTENDED RIGHT HEPATECTOMY, WITH  INTRAOPERATIVE ULTRASOUND;  Surgeon: Stark Klein, MD;  Location: [facility];  Service: General;  Laterality: N/A;   PARTIAL HYSTERECTOMY     portal vein embolization     01-13-15 -Dr. Cathlean Sauer.   Family History:  Family History  Problem Relation Age of Onset   Hypertension Mother    Atrial fibrillation Mother    Heart disease Mother        after age 82   Breast cancer Mother    Stroke Father    Dementia Father    Peptic Ulcer  Father    Heart disease Brother        After age 61- A-Fib   Heart attack Brother    Family Psychiatric  History: see above Social History:  Social History   Substance and Sexual Activity  Alcohol Use No   Alcohol/week: 0.0 standard drinks of alcohol     Social History   Substance and Sexual Activity  Drug Use No    Social History   Socioeconomic History   Marital status: Divorced    Spouse name: Not on file   Number of children: 3   Years of education: Not on file   Highest education level: Not on file  Occupational History   Occupation: retired  Tobacco Use   Smoking status: Former    Packs/day: 1.00    Years: 45.00    Total pack years: 45.00    Types: Cigarettes    Quit date: 02/20/2018    Years since quitting: 3.8   Smokeless tobacco: Never  Vaping Use   Vaping Use: Never used  Substance and Sexual Activity   Alcohol use: No    Alcohol/week: 0.0 standard drinks of alcohol   Drug use: No   Sexual activity: Not Currently  Other Topics Concern   Not on file  Social History Narrative   Not on file   Social Determinants of Health   Financial Resource Strain: Not on file  Food Insecurity: No Food Insecurity (12/07/2021)   Hunger Vital Sign    Worried About Running Out of Food in the Last Year: Never true    Ran Out of Food in the Last Year: Never true  Transportation Needs: No Transportation Needs (12/07/2021)   PRAPARE - Hydrologist (Medical): No    Lack of Transportation (Non-Medical): No  Physical Activity: Not on file  Stress: Not on file  Social Connections: Not on file   Additional Social History: has a place to live       Sleep: not well  Appetite:  Fair  Current Medications: Current Facility-Administered Medications  Medication Dose Route Frequency Provider Last Rate Last Admin   acetaminophen (TYLENOL) tablet 650 mg  650 mg Oral Q6H PRN Patrecia Pour, NP   650 mg at 12/08/21 0947   alum & mag  hydroxide-simeth (MAALOX/MYLANTA) 200-200-20 MG/5ML suspension 30 mL  30 mL Oral Q4H PRN Patrecia Pour, NP       clonazePAM Bobbye [place]) tablet 0.5 mg  0.5 mg Oral Daily PRN Patrecia Pour, NP   0.5 mg at 12/09/21 2115   FLUoxetine (PROZAC) capsule 20 mg  20 mg Oral Daily Patrecia Pour, NP   20 mg at 12/10/21 1018   gabapentin (NEURONTIN) capsule 300 mg  300 mg Oral QHS Patrecia Pour, NP   300 mg at 12/09/21 2104   HYDROcodone-acetaminophen (NORCO) 10-325 MG per tablet 1 tablet  1 tablet Oral QID Waylan Boga  Y, NP   1 tablet at 12/10/21 0831   magnesium hydroxide (MILK OF MAGNESIA) suspension 30 mL  30 mL Oral Daily PRN Patrecia Pour, NP   30 mL at 12/08/21 1335   metoprolol tartrate (LOPRESSOR) tablet 12.5 mg  12.5 mg Oral BID Patrecia Pour, NP   12.5 mg at 12/10/21 1018    Lab Results: No results found for this or any previous visit (from the past 48 hour(s)).  Blood Alcohol level:  Lab Results  Component Value Date   ETH <10 11/22/2021   ETH <10 18/29/9371    Metabolic Disorder Labs: Lab Results  Component Value Date   HGBA1C 6.2 05/24/2021   No results found for: "PROLACTIN" Lab Results  Component Value Date   CHOL 190 05/24/2021   TRIG 65.0 05/24/2021   HDL 90.80 05/24/2021   CHOLHDL 2 05/24/2021   VLDL 13.0 05/24/2021   LDLCALC 86 05/24/2021   LDLCALC 82 01/09/2017    Physical Findings: AIMS:  , ,  ,  ,    CIWA:    COWS:     Musculoskeletal: Strength & Muscle Tone: decreased Gait & Station: normal, uses walker  Patient leans: N/A  Psychiatric Specialty Exam: Physical Exam Vitals and nursing note reviewed.  Constitutional:      Appearance: Normal appearance.  HENT:     Head: Normocephalic.     Nose: Nose normal.  Pulmonary:     Effort: Pulmonary effort is normal.  Musculoskeletal:        General: Normal range of motion.     Cervical back: Normal range of motion.  Neurological:     General: No focal deficit present.     Mental Status: She is  alert and oriented to person, place, and time.  Psychiatric:        Attention and Perception: Attention and perception normal.        Mood and Affect: Mood is anxious and depressed.        Speech: Speech normal.        Behavior: Behavior normal. Behavior is cooperative.        Thought Content: Thought content normal.        Cognition and Memory: Cognition and memory normal.        Judgment: Judgment normal.     Review of Systems  Psychiatric/Behavioral:  Positive for depression. The patient is nervous/anxious.   All other systems reviewed and are negative.   Blood pressure (!) 137/125, pulse 68, temperature 98.9 F (37.2 C), temperature source Oral, resp. rate 16, height 5' (1.524 m), weight 57 kg, SpO2 97 %.Body mass index is 24.54 kg/m.  General Appearance: Casual  Eye Contact:  Good  Speech:  Normal Rate  Volume:  Normal  Mood:  Depressed, tearful   Affect:  Appropriate  Thought Process:  Coherent  Orientation:  Full (Time, Place, and Person)  Thought Content:  Logical  Suicidal Thoughts:  No, when asked reports "it's too hard"   Homicidal Thoughts:  No  Memory:  Recent;   Good  Judgement:  Fair  Insight:  Fair  Psychomotor Activity:   uses walker  Concentration:  Concentration: Good and Attention Span: Good  Recall:  NA  Fund of Knowledge:  Fair  Language:  Good  Akathisia:  No  Handed:  Right  AIMS (if indicated):     Assets:  Social Support  ADL's:  Intact  Cognition:  WNL  Sleep:  Physical Exam: Physical Exam Vitals and nursing note reviewed.  Constitutional:      Appearance: Normal appearance.  HENT:     Head: Normocephalic.     Nose: Nose normal.  Pulmonary:     Effort: Pulmonary effort is normal.  Musculoskeletal:        General: Normal range of motion.     Cervical back: Normal range of motion.  Neurological:     General: No focal deficit present.     Mental Status: She is alert and oriented to person, place, and time.  Psychiatric:         Attention and Perception: Attention and perception normal.        Mood and Affect: Mood is anxious and depressed.        Speech: Speech normal.        Behavior: Behavior normal. Behavior is cooperative.        Thought Content: Thought content normal.        Cognition and Memory: Cognition and memory normal.        Judgment: Judgment normal.    Review of Systems  Psychiatric/Behavioral:  Positive for depression. The patient is nervous/anxious.   All other systems reviewed and are negative.  Blood pressure (!) 137/125, pulse 68, temperature 98.9 F (37.2 C), temperature source Oral, resp. rate 16, height 5' (1.524 m), weight 57 kg, SpO2 97 %. Body mass index is 24.54 kg/m.   Treatment Plan Summary: Daily contact with patient to assess and evaluate symptoms and progress in treatment, Medication management, and Plan : Major depressive disorder, recurrent, severe: Prozac 20 mg daily  General anxiety disorder: Klonopin 0.5 mg daily PRN Gabapentin 300 mg at bedtime  Waylan Boga, NP 12/10/2021, 10:44 AM

## 2021-12-10 NOTE — Plan of Care (Signed)
  Problem: Education: Goal: Knowledge of General Education information will improve Description: Including pain rating scale, medication(s)/side effects and non-pharmacologic comfort measures 12/10/2021 1530 by Ileene Musa, RN Outcome: Progressing 12/10/2021 1111 by Ileene Musa, RN Outcome: Progressing   Problem: Health Behavior/Discharge Planning: Goal: Ability to manage health-related needs will improve 12/10/2021 1530 by Ileene Musa, RN Outcome: Progressing 12/10/2021 1111 by Ileene Musa, RN Outcome: Progressing   Problem: Clinical Measurements: Goal: Ability to maintain clinical measurements within normal limits will improve 12/10/2021 1530 by Ileene Musa, RN Outcome: Progressing 12/10/2021 1111 by Ileene Musa, RN Outcome: Progressing Goal: Will remain free from infection 12/10/2021 1530 by Ileene Musa, RN Outcome: Progressing 12/10/2021 1111 by Ileene Musa, RN Outcome: Progressing Goal: Diagnostic test results will improve 12/10/2021 1530 by Ileene Musa, RN Outcome: Progressing 12/10/2021 1111 by Ileene Musa, RN Outcome: Progressing Goal: Respiratory complications will improve 12/10/2021 1530 by Ileene Musa, RN Outcome: Progressing 12/10/2021 1111 by Ileene Musa, RN Outcome: Progressing Goal: Cardiovascular complication will be avoided 12/10/2021 1530 by Ileene Musa, RN Outcome: Progressing 12/10/2021 1111 by Ileene Musa, RN Outcome: Progressing   Problem: Activity: Goal: Risk for activity intolerance will decrease 12/10/2021 1530 by Ileene Musa, RN Outcome: Progressing 12/10/2021 1111 by Ileene Musa, RN Outcome: Progressing   Problem: Nutrition: Goal: Adequate nutrition will be maintained 12/10/2021 1530 by Ileene Musa, RN Outcome: Progressing 12/10/2021 1111 by Ileene Musa, RN Outcome: Progressing   Problem: Coping: Goal: Level of anxiety will  decrease Outcome: Progressing   Problem: Elimination: Goal: Will not experience complications related to bowel motility 12/10/2021 1530 by Ileene Musa, RN Outcome: Progressing 12/10/2021 1111 by Ileene Musa, RN Outcome: Progressing Goal: Will not experience complications related to urinary retention 12/10/2021 1530 by Ileene Musa, RN Outcome: Progressing 12/10/2021 1111 by Ileene Musa, RN Outcome: Progressing   Problem: Pain Managment: Goal: General experience of comfort will improve Outcome: Progressing   Problem: Safety: Goal: Ability to remain free from injury will improve 12/10/2021 1530 by Ileene Musa, RN Outcome: Progressing 12/10/2021 1111 by Ileene Musa, RN Outcome: Progressing   Problem: Skin Integrity: Goal: Risk for impaired skin integrity will decrease 12/10/2021 1530 by Ileene Musa, RN Outcome: Progressing 12/10/2021 1111 by Ileene Musa, RN Outcome: Progressing   Problem: Education: Goal: Ability to make informed decisions regarding treatment will improve 12/10/2021 1530 by Ileene Musa, RN Outcome: Progressing 12/10/2021 1111 by Ileene Musa, RN Outcome: Progressing   Problem: Coping: Goal: Coping ability will improve 12/10/2021 1530 by Ileene Musa, RN Outcome: Progressing 12/10/2021 1111 by Ileene Musa, RN Outcome: Progressing   Problem: Health Behavior/Discharge Planning: Goal: Identification of resources available to assist in meeting health care needs will improve 12/10/2021 1530 by Ileene Musa, RN Outcome: Progressing 12/10/2021 1111 by Ileene Musa, RN Outcome: Progressing   Problem: Medication: Goal: Compliance with prescribed medication regimen will improve 12/10/2021 1530 by Ileene Musa, RN Outcome: Progressing 12/10/2021 1111 by Ileene Musa, RN Outcome: Progressing   Problem: Self-Concept: Goal: Ability to disclose and discuss suicidal  ideas will improve 12/10/2021 1530 by Ileene Musa, RN Outcome: Progressing 12/10/2021 1111 by Ileene Musa, RN Outcome: Progressing Goal: Will verbalize positive feelings about self 12/10/2021 1530 by Ileene Musa, RN Outcome: Progressing 12/10/2021 1111 by Ileene Musa, RN Outcome: Progressing

## 2021-12-10 NOTE — Progress Notes (Signed)
1:1 hourly rounding  0700: Patient is awake in her room. Oxygen @ 2L in place via [place] which is a continuous order.  0800: Patient got up to eat breakfast. Consumed 50% of her meal. Medication administered.  0900: Patient is in her bedroom resting.  1000: Patient is in bedroom resting.Medication administered.  1100: Patient is interacting well with her 1:1 staff.  1200: Patient is eating lunch consuming 75% of her meal. Medication administered.  1300:Patient is getting ready to attend group therapy.  1400: Group therapy in session.  1500: Patient is in her bedroom interacting with staff. Portable oxygen tank assessed.  1600: Patient is eating dinner. Medications administered.  1700: Patient consumed 50% of her meal.  1745: Patient is in the courtyard with interacting with other patients.  1800: Patient is in the courtyard interacting with other patients.  1845: Patient appears calm while resting in her room.  Patient is A+O x 3. She denies SI/HI/AVH. Her mood is sad. Patient continues to perseverate about being cold even after the environment has been made warm to her liking and extra clothes layer of clothes are being worn. Staff continues to provide encouragement and institute active listening. 1:1 order remains in place for continuous oxygen use @ 2 L.

## 2021-12-11 NOTE — Progress Notes (Signed)
1:1 hourly rounding   0700: Patient is awake in her room. Oxygen @ 2L in place via [place] cont.   0800: Patient is eating breakfast. Consumed 25% of her meal. Medication administered.   0900: Patient is in the dayroom interacting with peers and staff.   1000: Patient is in the dayroom. Medication administered.   1100: Patient is interacting well with her 1:1 staff.   1200: Patient is eating lunch consuming 50% of her meal. Medication administered.   1300:Patient is in the dayroom enjoying music group therapy.   1400: Patient is in her room. Respirations even/unlabored.   1500: Patient is in her bedroom interacting with staff.    1600: Patient is in the dayroom getting ready for dinner. Medications administered.  1700: Patient is in the dayroom.  1800: Patient is in her bedroom resting.  Patient is A+O x 3. Her affect is appropriate and mood sad. Patient denies SI/HI/AVH. She endorses anxiety. "I can't breathe!" 1:1 order in place for oxygen use at 2L continuously. Staff engaging in therapeutic communication and active listening while sitting with the patient.

## 2021-12-11 NOTE — Progress Notes (Signed)
Patient is alert and oriented times 3. Mood and affect appropriate. Patient rates pain as 6/10. She denies SI, HI, and AVH. Patient does endorse feelings of anxiety and depression at this time. Patient states she slept well last night. Evening medicines administered whole by mouth without difficulty. Patient ate snack in day room; appetite was fair. Patient remains on unit with Q15 minute checks in place.     12/10/2021 2000--Patient became extremely anxious and concerned that the oxygen was not flowing properly from the wall outlet and the portable tank.  Patient provided reassurance and shown that the oxygen was flowing properly and blood oxygen levels were appropriate at baseline.  Patient also given PRN anxiety medication.

## 2021-12-11 NOTE — Plan of Care (Signed)
  Problem: Education: Goal: Knowledge of General Education information will improve Description: Including pain rating scale, medication(s)/side effects and non-pharmacologic comfort measures Outcome: Progressing   Problem: Health Behavior/Discharge Planning: Goal: Ability to manage health-related needs will improve Outcome: Progressing   Problem: Clinical Measurements: Goal: Ability to maintain clinical measurements within normal limits will improve Outcome: Progressing Goal: Will remain free from infection Outcome: Progressing Goal: Diagnostic test results will improve Outcome: Progressing Goal: Respiratory complications will improve Outcome: Progressing Goal: Cardiovascular complication will be avoided Outcome: Progressing   Problem: Activity: Goal: Risk for activity intolerance will decrease Outcome: Progressing   Problem: Nutrition: Goal: Adequate nutrition will be maintained Outcome: Progressing   Problem: Coping: Goal: Level of anxiety will decrease Outcome: Progressing   Problem: Elimination: Goal: Will not experience complications related to bowel motility Outcome: Progressing Goal: Will not experience complications related to urinary retention Outcome: Progressing   Problem: Pain Managment: Goal: General experience of comfort will improve Outcome: Progressing   Problem: Safety: Goal: Ability to remain free from injury will improve Outcome: Progressing   Problem: Skin Integrity: Goal: Risk for impaired skin integrity will decrease Outcome: Progressing   Problem: Education: Goal: Ability to make informed decisions regarding treatment will improve Outcome: Progressing   Problem: Coping: Goal: Coping ability will improve Outcome: Progressing   Problem: Health Behavior/Discharge Planning: Goal: Identification of resources available to assist in meeting health care needs will improve Outcome: Progressing   Problem: Medication: Goal: Compliance with  prescribed medication regimen will improve Outcome: Progressing   Problem: Self-Concept: Goal: Ability to disclose and discuss suicidal ideas will improve Outcome: Progressing Goal: Will verbalize positive feelings about self Outcome: Progressing   

## 2021-12-11 NOTE — Progress Notes (Signed)
Patient attended music therapy group and actively engaged in discussion on how music can be used as a Technical sales engineer.

## 2021-12-11 NOTE — Progress Notes (Signed)
[facility] MD Progress Note  12/11/2021 3:08 PM Tina Bailey  MRN:  [id number] Subjective: Follow-up for 76 year old woman with depression.  Patient seen and chart reviewed.  Patient with multiple medical problems still on regular oxygen.  Limited mobility.  Lots of chronic pain.  She was specifically complaining of chronic pain in her right flank indicating her rib area today.  Patient has had multiple chronic orthopedic problems for which she takes chronic narcotic pain medicine.  She does not report active suicidal thoughts today.  Says that she would like to hear some music.  Seems a little down but not severely depressed. Principal Problem: Severe recurrent major depression without psychotic features ([place]) Diagnosis: Principal Problem:   Severe recurrent major depression without psychotic features ([place]) Active Problems:   Major depressive disorder, recurrent severe without psychotic features ([place])  Total Time spent with patient: 30 minutes  Past Psychiatric History: Past history of recurrent depression with suicidality  Past Medical History:  Past Medical History:  Diagnosis Date   Arthritis    DDD. Right shoulder"is frozen"-limited ROM. osteoporosis.   Blue toe syndrome ([place]) 11/27/2014   Dr. Claudia Pollock evaluating   Chronic diastolic CHF (congestive heart failure) (HCC)    Chronic respiratory failure (HCC)    Complication of anesthesia    COPD (chronic obstructive pulmonary disease) ([place])    Fibromyalgia    Fracture of rib of right side    hx "osteoporosis"- states her dog nudge her on the side, next day developed great pain and was told has a fracture rib.   Liver cancer ([place]) 12/2014   Macular degeneration    R eye   Osteoporosis    PONV (postoperative nausea and vomiting)    nausea,severe vomiting after 01-13-15 portal vein embolization   Productive cough    Retina disorder    L eye, vision distorted, edema   Right atrial mass    Spine fracture due to birth trauma     Thoracic aortic aneurysm (TAA) ([place])    TMJ disease    Wears glasses     Past Surgical History:  Procedure Laterality Date   ANGIOPLASTY  2008   no stents required, no follow-up with cardiologist, no recurrent chest pain   CATARACT EXTRACTION, BILATERAL Bilateral    ESOPHAGOGASTRODUODENOSCOPY (EGD) WITH PROPOFOL N/A 06/25/2015   Procedure: ESOPHAGOGASTRODUODENOSCOPY (EGD) WITH PROPOFOL;  Surgeon: Milus Banister, MD;  Location: [facility];  Service: Endoscopy;  Laterality: N/A;  stent removal    EYE SURGERY     cornea surgery   INTRAMEDULLARY (IM) NAIL INTERTROCHANTERIC Right 12/04/2017   Procedure: INTRAMEDULLARY (IM) NAIL INTERTROCHANTRIC;  Surgeon: Rod Can, MD;  Location: [facility];  Service: Orthopedics;  Laterality: Right;   IR RADIOLOGIST EVAL & MGMT  03/29/2017   KIDNEY STONE SURGERY     LAPAROSCOPIC PARTIAL HEPATECTOMY N/A 03/04/2015   Procedure: DIAGNOSTIC LAPAROSCOPY, EXTENDED RIGHT HEPATECTOMY, WITH INTRAOPERATIVE ULTRASOUND;  Surgeon: Stark Klein, MD;  Location: [facility];  Service: General;  Laterality: N/A;   PARTIAL HYSTERECTOMY     portal vein embolization     01-13-15 -Dr. Cathlean Sauer.   Family History:  Family History  Problem Relation Age of Onset   Hypertension Mother    Atrial fibrillation Mother    Heart disease Mother        after age 80   Breast cancer Mother    Stroke Father    Dementia Father    Peptic Ulcer Father    Heart disease Brother  After age 68- A-Fib   Heart attack Brother    Family Psychiatric  History: See previous Social History:  Social History   Substance and Sexual Activity  Alcohol Use No   Alcohol/week: 0.0 standard drinks of alcohol     Social History   Substance and Sexual Activity  Drug Use No    Social History   Socioeconomic History   Marital status: Divorced    Spouse name: Not on file   Number of children: 3   Years of education: Not on file   Highest education level: Not on file  Occupational  History   Occupation: retired  Tobacco Use   Smoking status: Former    Packs/day: 1.00    Years: 45.00    Total pack years: 45.00    Types: Cigarettes    Quit date: 02/20/2018    Years since quitting: 3.8   Smokeless tobacco: Never  Vaping Use   Vaping Use: Never used  Substance and Sexual Activity   Alcohol use: No    Alcohol/week: 0.0 standard drinks of alcohol   Drug use: No   Sexual activity: Not Currently  Other Topics Concern   Not on file  Social History Narrative   Not on file   Social Determinants of Health   Financial Resource Strain: Not on file  Food Insecurity: No Food Insecurity (12/07/2021)   Hunger Vital Sign    Worried About Running Out of Food in the Last Year: Never true    Ran Out of Food in the Last Year: Never true  Transportation Needs: No Transportation Needs (12/07/2021)   PRAPARE - Hydrologist (Medical): No    Lack of Transportation (Non-Medical): No  Physical Activity: Not on file  Stress: Not on file  Social Connections: Not on file   Additional Social History:                         Sleep: Fair  Appetite:  Fair  Current Medications: Current Facility-Administered Medications  Medication Dose Route Frequency Provider Last Rate Last Admin   acetaminophen (TYLENOL) tablet 650 mg  650 mg Oral Q6H PRN Patrecia Pour, NP   650 mg at 12/08/21 0947   alum & mag hydroxide-simeth (MAALOX/MYLANTA) 200-200-20 MG/5ML suspension 30 mL  30 mL Oral Q4H PRN Patrecia Pour, NP       clonazePAM Bobbye [place]) tablet 0.5 mg  0.5 mg Oral Daily PRN Patrecia Pour, NP   0.5 mg at 12/10/21 2007   FLUoxetine (PROZAC) capsule 20 mg  20 mg Oral Daily Patrecia Pour, NP   20 mg at 12/11/21 2831   gabapentin (NEURONTIN) capsule 300 mg  300 mg Oral QHS Patrecia Pour, NP   300 mg at 12/10/21 2124   HYDROcodone-acetaminophen (NORCO) 10-325 MG per tablet 1 tablet  1 tablet Oral QID Patrecia Pour, NP   1 tablet at 12/11/21 1157    magnesium hydroxide (MILK OF MAGNESIA) suspension 30 mL  30 mL Oral Daily PRN Patrecia Pour, NP   30 mL at 12/08/21 1335   metoprolol tartrate (LOPRESSOR) tablet 12.5 mg  12.5 mg Oral BID Patrecia Pour, NP   12.5 mg at 12/11/21 5176    Lab Results: No results found for this or any previous visit (from the past 48 hour(s)).  Blood Alcohol level:  Lab Results  Component Value Date   ETH <10 11/22/2021   ETH <  10 69/67/8938    Metabolic Disorder Labs: Lab Results  Component Value Date   HGBA1C 6.2 05/24/2021   No results found for: "PROLACTIN" Lab Results  Component Value Date   CHOL 190 05/24/2021   TRIG 65.0 05/24/2021   HDL 90.80 05/24/2021   CHOLHDL 2 05/24/2021   VLDL 13.0 05/24/2021   LDLCALC 86 05/24/2021   LDLCALC 82 01/09/2017    Physical Findings: AIMS:  , ,  ,  ,    CIWA:    COWS:     Musculoskeletal: Strength & Muscle Tone: within normal limits Gait & Station: normal Patient leans: N/A  Psychiatric Specialty Exam:  Presentation  General Appearance: Casual  Eye Contact:Fair  Speech:Clear and Coherent  Speech Volume:Normal  Handedness:No data recorded  Mood and Affect  Mood:Euthymic  Affect:Blunt   Thought Process  Thought Processes:Goal Directed  Descriptions of Associations:Tangential  Orientation:Full (Time, Place and Person)  Thought Content:Logical  History of Schizophrenia/Schizoaffective disorder:No  Duration of Psychotic Symptoms:No data recorded Hallucinations:No data recorded Ideas of Reference:None  Suicidal Thoughts:No data recorded Homicidal Thoughts:No data recorded  Sensorium  Memory:Recent Good; Immediate Fair; Remote Fair  Judgment:Poor  Insight:Shallow   Executive Functions  Concentration:Fair  Attention Span:Fair  [place]   Psychomotor Activity  Psychomotor Activity:No data recorded  Assets  Assets:Social Support; Resilience; Physical Health;  Financial Resources/Insurance   Sleep  Sleep:No data recorded   Physical Exam: Physical Exam Vitals and nursing note reviewed.  Constitutional:      Appearance: Normal appearance.  HENT:     Head: Normocephalic and atraumatic.     Mouth/Throat:     Pharynx: Oropharynx is clear.  Eyes:     Pupils: Pupils are equal, round, and reactive to light.  Cardiovascular:     Rate and Rhythm: Normal rate and regular rhythm.  Pulmonary:     Effort: Pulmonary effort is normal.     Breath sounds: Normal breath sounds.  Abdominal:     General: Abdomen is flat.     Palpations: Abdomen is soft.  Musculoskeletal:        General: Normal range of motion.  Skin:    General: Skin is warm and dry.  Neurological:     General: No focal deficit present.     Mental Status: She is alert. Mental status is at baseline.  Psychiatric:        Attention and Perception: She is inattentive.        Mood and Affect: Mood is anxious.        Speech: Speech normal.        Behavior: Behavior is cooperative.        Thought Content: Thought content normal.    Review of Systems  Constitutional: Negative.   HENT: Negative.    Eyes: Negative.   Respiratory: Negative.    Cardiovascular: Negative.   Gastrointestinal: Negative.   Musculoskeletal: Negative.        Complains of chronic pain today most specifically in her right flank  Skin: Negative.   Neurological: Negative.    Blood pressure (!) 142/65, pulse 63, temperature (!) 97.5 F (36.4 C), temperature source Oral, resp. rate 18, height 5' (1.524 m), weight 57 kg, SpO2 95 %. Body mass index is 24.54 kg/m.   Treatment Plan Summary: Plan supportive counseling and therapy.  Review of medicine.  Already on pretty high doses of narcotic pain medicine no indication to change anything.  Unlikely to benefit from any attempt at  any x-rays.  Possibly could be having rib pain but it seems to be part of her overall chronic orthopedic pain.  Alethia Berthold,  MD 12/11/2021, 3:08 PM

## 2021-12-11 NOTE — Progress Notes (Signed)
Patient is alert and oriented times 4. Mood and affect appropriate. Patient rates pain as 7/10. She denies SI, HI, and AVH. Patient does endorse feelings of anxiety and depression at this time. Patient states she slept well last night. Evening medicines administered whole by mouth without difficulty. Patient refused snack. Patient remains on unit with Q15 minute checks in place and 1:1 safety sitter.

## 2021-12-12 LAB — CBC WITH DIFFERENTIAL/PLATELET
Abs Immature Granulocytes: 0.02 10*3/uL (ref 0.00–0.07)
Basophils Absolute: 0 10*3/uL (ref 0.0–0.1)
Basophils Relative: 0 %
Eosinophils Absolute: 0.1 10*3/uL (ref 0.0–0.5)
Eosinophils Relative: 1 %
HCT: 33.6 % — ABNORMAL LOW (ref 36.0–46.0)
Hemoglobin: 10.9 g/dL — ABNORMAL LOW (ref 12.0–15.0)
Immature Granulocytes: 0 %
Lymphocytes Relative: 22 %
Lymphs Abs: 1.5 10*3/uL (ref 0.7–4.0)
MCH: 30.4 pg (ref 26.0–34.0)
MCHC: 32.4 g/dL (ref 30.0–36.0)
MCV: 93.9 fL (ref 80.0–100.0)
Monocytes Absolute: 0.4 10*3/uL (ref 0.1–1.0)
Monocytes Relative: 6 %
Neutro Abs: 4.8 10*3/uL (ref 1.7–7.7)
Neutrophils Relative %: 71 %
Platelets: 185 10*3/uL (ref 150–400)
RBC: 3.58 MIL/uL — ABNORMAL LOW (ref 3.87–5.11)
RDW: 11.9 % (ref 11.5–15.5)
WBC: 6.7 10*3/uL (ref 4.0–10.5)
nRBC: 0 % (ref 0.0–0.2)

## 2021-12-12 LAB — COMPREHENSIVE METABOLIC PANEL
ALT: 13 U/L (ref 0–44)
AST: 16 U/L (ref 15–41)
Albumin: 3.8 g/dL (ref 3.5–5.0)
Alkaline Phosphatase: 71 U/L (ref 38–126)
Anion gap: 8 (ref 5–15)
BUN: 14 mg/dL (ref 8–23)
CO2: 34 mmol/L — ABNORMAL HIGH (ref 22–32)
Calcium: 8.5 mg/dL — ABNORMAL LOW (ref 8.9–10.3)
Chloride: 89 mmol/L — ABNORMAL LOW (ref 98–111)
Creatinine, Ser: 0.54 mg/dL (ref 0.44–1.00)
GFR, Estimated: >60 mL/min (ref >60)
Glucose, Bld: 107 mg/dL — ABNORMAL HIGH (ref 70–99)
Potassium: 4.6 mmol/L (ref 3.5–5.1)
Sodium: 131 mmol/L — ABNORMAL LOW (ref 135–145)
Total Bilirubin: 0.8 mg/dL (ref 0.3–1.2)
Total Protein: 6.3 g/dL — ABNORMAL LOW (ref 6.5–8.1)

## 2021-12-12 LAB — LIPASE, BLOOD: Lipase: 22 U/L (ref 11–51)

## 2021-12-12 MED ORDER — MELOXICAM 7.5 MG PO TABS
7.5000 mg | ORAL_TABLET | Freq: Every day | ORAL | Status: DC | PRN
  Administered 2021-12-13: 7.5 mg via ORAL
  Filled 2021-12-12 (×2): qty 1

## 2021-12-12 MED ORDER — MELOXICAM 7.5 MG PO TABS: 7.5000 mg | ORAL_TABLET | Freq: Every day | ORAL | Status: DC | PRN

## 2021-12-12 MED ORDER — DOCUSATE SODIUM 100 MG PO CAPS
100.0000 mg | ORAL_CAPSULE | Freq: Two times a day (BID) | ORAL | Status: DC
  Administered 2021-12-12 – 2021-12-13 (×3): 100 mg via ORAL
  Filled 2021-12-12 (×10): qty 1

## 2021-12-12 NOTE — Progress Notes (Signed)
Pt continues to make suicidal ideation statements stating '' I wish I had some morphine to take so I could go to sleep and not wake up. '' Pt remains on 1.1. for safety. Encouragement and support provided. Will con't to monitor.

## 2021-12-12 NOTE — Progress Notes (Signed)
Pt has continued to report abdominal pain. She has been offered stool softener again which she refuses and states pain returns and continues to be '' jabbing in my side. '' Patient states ''I'm telling you this is my appendix. '' Notified MD [place] of above as patient has continued to report pain returning. Md reports will consult hospitalist.

## 2021-12-12 NOTE — Assessment & Plan Note (Addendum)
Last normal imaging in 10 of 2022 with no evidence of recurrence, by MRI.  On admission a CT stone study showed similar findings.  There is no abnormality in her liver function testing or any findings on exam that would make me think we need to reimage this.

## 2021-12-12 NOTE — Progress Notes (Signed)
This Probation officer received a call from patients daughter [name] who was very upset and angry. I reviewed patients medications with daughter and she then began yelling that we have done nothing for the patient other than give her a Actuary. [name] states her mom is miserable because no' \\one'$  has adjusted her meds (pt admitted with an L5 fracture).

## 2021-12-12 NOTE — Consult Note (Signed)
Initial Consultation Note   Patient: Tina Bailey MVE:[id number] DOB: [birth year] PCP: Vilinda Boehringer, NP DOA: 12/07/2021 DOS: the patient was seen and examined on 12/12/2021 Primary service: [place] [facility]  Referring physician: Dr. Weber Cooks Reason for consult: Abdominal  Assessment/Plan: Assessment and Plan: * Severe recurrent major depression without psychotic features [facility]) Per psychiatry team  Chronic pain New onset left upper quadrant pain about the area of her rib cage.  On scheduled hydrocodone.  We will add as needed anti-inflammatory given possible inflammatory nature of pain.  Ice pack as well. She has no elevated WBC, no fever, no change in bowel habits.  She is not eating but is not related to this.  This pain is related to movement and taking a deep breath.  Essential hypertension Continue metoprolol, BP is fairly well controlled.  Hepatocellular carcinoma ([place]) Last normal imaging in 10 of 2022 with no evidence of recurrence, by MRI.  On admission a CT stone study showed similar findings.  There is no abnormality in her liver function testing or any findings on exam that would make me think we need to reimage this.       TRH will sign off at present, please call us again when needed.  HPI: Tina Bailey is a 76 y.o. female with past medical history of COPD on chronic O2, pulmonary hypertension, grade 1 diastolic dysfunction, history of hepatocellular carcinoma status postresection with no recurrence x5 years.  Patient was admitted with intentional overdose and did have A-fib with rapid ventricular response on admission but this spontaneously resolved.  She has been in the [facility] psych unit for the last few weeks.  Over the last few days she reports increasing right upper quadrant pain.  Per her it clicks when she stands up.  It is worse when she pushes on it.  She denies any change in bowel habits, she is drinking well and eating but that is due to poor  appetite not related to this pain at all.  She reports normal formed bowel movements that have not changed.  Review of Systems: As mentioned in the history of present illness. All other systems reviewed and are negative. Past Medical History:  Diagnosis Date   Arthritis    DDD. Right shoulder"is frozen"-limited ROM. osteoporosis.   Blue toe syndrome ([place]) 11/27/2014   Dr. Claudia Pollock evaluating   Chronic diastolic CHF (congestive heart failure) (HCC)    Chronic respiratory failure (HCC)    Complication of anesthesia    COPD (chronic obstructive pulmonary disease) ([place])    Fibromyalgia    Fracture of rib of right side    hx "osteoporosis"- states her dog nudge her on the side, next day developed great pain and was told has a fracture rib.   Liver cancer ([place]) 12/2014   Macular degeneration    R eye   Osteoporosis    PONV (postoperative nausea and vomiting)    nausea,severe vomiting after 01-13-15 portal vein embolization   Productive cough    Retina disorder    L eye, vision distorted, edema   Right atrial mass    Spine fracture due to birth trauma    Thoracic aortic aneurysm (TAA) ([place])    TMJ disease    Wears glasses    Past Surgical History:  Procedure Laterality Date   ANGIOPLASTY  2008   no stents required, no follow-up with cardiologist, no recurrent chest pain   CATARACT EXTRACTION, BILATERAL Bilateral    ESOPHAGOGASTRODUODENOSCOPY (EGD) WITH PROPOFOL N/A 06/25/2015  Procedure: ESOPHAGOGASTRODUODENOSCOPY (EGD) WITH PROPOFOL;  Surgeon: Milus Banister, MD;  [facility];  Service: Endoscopy;  Laterality: N/A;  stent removal    EYE SURGERY     cornea surgery   INTRAMEDULLARY (IM) NAIL INTERTROCHANTERIC Right 12/04/2017   Procedure: INTRAMEDULLARY (IM) NAIL INTERTROCHANTRIC;  Surgeon: Rod Can, MD;  [facility];  Service: Orthopedics;  Laterality: Right;   IR RADIOLOGIST EVAL & MGMT  03/29/2017   KIDNEY STONE SURGERY     LAPAROSCOPIC PARTIAL  HEPATECTOMY N/A 03/04/2015   Procedure: DIAGNOSTIC LAPAROSCOPY, EXTENDED RIGHT HEPATECTOMY, WITH INTRAOPERATIVE ULTRASOUND;  Surgeon: Stark Klein, MD;  Location: [facility];  Service: General;  Laterality: N/A;   PARTIAL HYSTERECTOMY     portal vein embolization     01-13-15 -Dr. Cathlean Sauer.   Social History:  reports that she quit smoking about 3 years ago. Her smoking use included cigarettes. She has a 45.00 pack-year smoking history. She has never used smokeless tobacco. She reports that she does not drink alcohol and does not use drugs.  Allergies  Allergen Reactions   Zoloft [Sertraline Hcl] Other (See Comments)    Zoloft, had a terrible reaction   Amoxicillin-Pot Clavulanate Nausea Only    Amoxacillin Augmentin combo  Has patient had a PCN reaction causing immediate rash, facial/tongue/throat swelling, SOB or lightheadedness with hypotension: No Has patient had a PCN reaction causing severe rash involving mucus membranes or skin necrosis: No Has patient had a PCN reaction that required hospitalization No Has patient had a PCN reaction occurring within the last 10 years: No If all of the above answers are "NO", then may proceed with Cephalosporin use.    Clindamycin/Lincomycin Other (See Comments)    Felt like it "burned out" her stomach/took a large dose   Paxil [Paroxetine Hcl] Rash   Sulfa Antibiotics Rash    Family History  Problem Relation Age of Onset   Hypertension Mother    Atrial fibrillation Mother    Heart disease Mother        after age 14   Breast cancer Mother    Stroke Father    Dementia Father    Peptic Ulcer Father    Heart disease Brother        After age 62- A-Fib   Heart attack Brother     Prior to Admission medications   Medication Sig Start Date End Date Taking? Authorizing Provider  Budeson-Glycopyrrol-Formoterol (BREZTRI AEROSPHERE) 160-9-4.8 MCG/ACT AERO Take 2 puffs first thing in am and then another 2 puffs about 12 hours later. 08/23/21    Tanda Rockers, MD  Cholecalciferol (VITAMIN D3) 50 MCG (2000 UT) capsule Take 2,000 Units by mouth daily after lunch. 04/05/21   [provider]  clonazePAM (KLONOPIN) 0.5 MG tablet Take 1 tablet (0.5 mg total) by mouth every 12 (twelve) hours as needed (Anxiety). 12/06/21   Barton Dubois, MD  FLUoxetine (PROZAC) 20 MG capsule Take 1 capsule (20 mg total) by mouth daily. Patient taking differently: Take 60 mg by mouth daily. 07/17/21   Parks Ranger, DO  gabapentin (NEURONTIN) 300 MG capsule Take 1 capsule (300 mg total) by mouth at bedtime. 07/16/21   Parks Ranger, DO  HYDROcodone-acetaminophen [facility]) 10-325 MG tablet Take 1 tablet by mouth in the morning, at noon, in the evening, and at bedtime.    [provider]  metoprolol tartrate (LOPRESSOR) 25 MG tablet Take 0.5 tablets (12.5 mg total) by mouth 2 (two) times daily. 12/06/21   Dyann Kief,  Clifton James, MD  mirtazapine (REMERON) 15 MG tablet Take 1 tablet (15 mg total) by mouth at bedtime. Patient not taking: Reported on 11/23/2021 07/16/21   Parks Ranger, DO  Multiple Vitamin (MULTIVITAMIN) tablet Take 1 tablet by mouth daily.    [provider]  omeprazole (PRILOSEC) 20 MG capsule Take 20 mg by mouth daily. Patient not taking: Reported on 11/23/2021 10/07/21   [provider]  OXYGEN Inhale 2 L into the lungs continuous.    [provider]  polyethylene glycol (MIRALAX / GLYCOLAX) 17 g packet Take 17 g by mouth daily as needed for mild constipation. 12/06/21   Barton Dubois, MD  risperiDONE (RISPERDAL) 0.25 MG tablet Take 1 tablet (0.25 mg total) by mouth 2 (two) times daily at 8 am and 4 pm. Patient not taking: Reported on 11/23/2021 07/16/21   Parks Ranger, DO  senna (SENOKOT) 8.6 MG TABS tablet Take 1 tablet by mouth daily.    [provider]    Physical Exam: Vitals:   12/11/21 2025 12/12/21 0751 12/12/21 1134 12/12/21 1551  BP: (!) 122/51 (!) 118/46  121/64 (!) 149/83  Pulse: (!) 59 66  (!) 58  Resp: 18     Temp: 97.8 F (36.6 C) 98.9 F (37.2 C)  98.5 F (36.9 C)  TempSrc: Oral Oral  Oral  SpO2: 99% 99%  100%  Weight:      Height:       Physical Examination: General appearance - normal appearing weight and chronically ill appearing Mental status - alert, oriented to person, place, and time Neck - supple, no significant adenopathy Chest - clear to auscultation, no wheezes, rales or rhonchi, symmetric air entry, there is mild tenderness to palpation over the right lowest rib. Heart - normal rate, regular rhythm, normal S1, S2, no murmurs, rubs, clicks or gallops Abdomen - soft, nontender, nondistended, no masses or organomegaly bowel sounds normal Neurological - alert, oriented, normal speech, no focal findings or movement disorder noted  Data Reviewed:  Results for orders placed or performed during the hospital encounter of 12/07/21 (from the past 24 hour(s))  Comprehensive metabolic panel     Status: Abnormal   Collection Time: 12/12/21  1:22 PM  Result Value Ref Range   Sodium 131 (L) 135 - 145 mmol/L   Potassium 4.6 3.5 - 5.1 mmol/L   Chloride 89 (L) 98 - 111 mmol/L   CO2 34 (H) 22 - 32 mmol/L   Glucose, Bld 107 (H) 70 - 99 mg/dL   BUN 14 8 - 23 mg/dL   Creatinine, Ser 0.54 0.44 - 1.00 mg/dL   Calcium 8.5 (L) 8.9 - 10.3 mg/dL   Total Protein 6.3 (L) 6.5 - 8.1 g/dL   Albumin 3.8 3.5 - 5.0 g/dL   AST 16 15 - 41 U/L   ALT 13 0 - 44 U/L   Alkaline Phosphatase 71 38 - 126 U/L   Total Bilirubin 0.8 0.3 - 1.2 mg/dL   GFR, Estimated >60 >60 mL/min   Anion gap 8 5 - 15  CBC with Differential/Platelet     Status: Abnormal   Collection Time: 12/12/21  1:22 PM  Result Value Ref Range   WBC 6.7 4.0 - 10.5 K/uL   RBC 3.58 (L) 3.87 - 5.11 MIL/uL   Hemoglobin 10.9 (L) 12.0 - 15.0 g/dL   HCT 33.6 (L) 36.0 - 46.0 %   MCV 93.9 80.0 - 100.0 fL   MCH 30.4 26.0 - 34.0 pg  MCHC 32.4 30.0 - 36.0 g/dL   RDW 11.9 11.5 - 15.5 %    Platelets 185 150 - 400 K/uL   nRBC 0.0 0.0 - 0.2 %   Neutrophils Relative % 71 %   Neutro Abs 4.8 1.7 - 7.7 K/uL   Lymphocytes Relative 22 %   Lymphs Abs 1.5 0.7 - 4.0 K/uL   Monocytes Relative 6 %   Monocytes Absolute 0.4 0.1 - 1.0 K/uL   Eosinophils Relative 1 %   Eosinophils Absolute 0.1 0.0 - 0.5 K/uL   Basophils Relative 0 %   Basophils Absolute 0.0 0.0 - 0.1 K/uL   Immature Granulocytes 0 %   Abs Immature Granulocytes 0.02 0.00 - 0.07 K/uL      Family Communication: Daughter at bedside Primary team communication: notes written, RN staff on unit Thank you very much for involving [place] in the care of your patient.  Author: Donnamae Jude, MD 12/12/2021 7:17 PM  For on call review [email or web address].

## 2021-12-12 NOTE — Progress Notes (Signed)
[facility] MD Progress Note  12/12/2021 12:20 PM Tina Bailey  MRN:  [id number] Subjective: Follow-up for this 76 year old woman with severe recurrent depression.  Patient complaining again of pain today in a different part of her body from yesterday.  Nursing reports she did get up today but went back to bed and has not had much to eat.  More dysphoric.  No evidence of any attempts to harm her self.  Vitals stable.  Reviewed labs nothing had been ordered new recently. Principal Problem: Severe recurrent major depression without psychotic features ([place]) Diagnosis: Principal Problem:   Severe recurrent major depression without psychotic features ([place]) Active Problems:   Major depressive disorder, recurrent severe without psychotic features ([place])  Total Time spent with patient: 20 minutes  Past Psychiatric History: Past history of depression and chronic pain substance concerns  Past Medical History:  Past Medical History:  Diagnosis Date   Arthritis    DDD. Right shoulder"is frozen"-limited ROM. osteoporosis.   Blue toe syndrome ([place]) 11/27/2014   Dr. Claudia Pollock evaluating   Chronic diastolic CHF (congestive heart failure) (HCC)    Chronic respiratory failure (HCC)    Complication of anesthesia    COPD (chronic obstructive pulmonary disease) ([place])    Fibromyalgia    Fracture of rib of right side    hx "osteoporosis"- states her dog nudge her on the side, next day developed great pain and was told has a fracture rib.   Liver cancer ([place]) 12/2014   Macular degeneration    R eye   Osteoporosis    PONV (postoperative nausea and vomiting)    nausea,severe vomiting after 01-13-15 portal vein embolization   Productive cough    Retina disorder    L eye, vision distorted, edema   Right atrial mass    Spine fracture due to birth trauma    Thoracic aortic aneurysm (TAA) ([employer])    TMJ disease    Wears glasses     Past Surgical History:  Procedure Laterality Date   ANGIOPLASTY  2008   no  stents required, no follow-up with cardiologist, no recurrent chest pain   CATARACT EXTRACTION, BILATERAL Bilateral    ESOPHAGOGASTRODUODENOSCOPY (EGD) WITH PROPOFOL N/A 06/25/2015   Procedure: ESOPHAGOGASTRODUODENOSCOPY (EGD) WITH PROPOFOL;  Surgeon: Milus Banister, MD;  Location: [facility];  Service: Endoscopy;  Laterality: N/A;  stent removal    EYE SURGERY     cornea surgery   INTRAMEDULLARY (IM) NAIL INTERTROCHANTERIC Right 12/04/2017   Procedure: INTRAMEDULLARY (IM) NAIL INTERTROCHANTRIC;  Surgeon: Rod Can, MD;  Location: [facility];  Service: Orthopedics;  Laterality: Right;   IR RADIOLOGIST EVAL & MGMT  03/29/2017   KIDNEY STONE SURGERY     LAPAROSCOPIC PARTIAL HEPATECTOMY N/A 03/04/2015   Procedure: DIAGNOSTIC LAPAROSCOPY, EXTENDED RIGHT HEPATECTOMY, WITH INTRAOPERATIVE ULTRASOUND;  Surgeon: Stark Klein, MD;  Location: [facility];  Service: General;  Laterality: N/A;   PARTIAL HYSTERECTOMY     portal vein embolization     01-13-15 -Dr. Cathlean Sauer.   Family History:  Family History  Problem Relation Age of Onset   Hypertension Mother    Atrial fibrillation Mother    Heart disease Mother        after age 104   Breast cancer Mother    Stroke Father    Dementia Father    Peptic Ulcer Father    Heart disease Brother        After age 35- A-Fib   Heart attack [place]  History: See previous Social History:  Social History   Substance and Sexual Activity  Alcohol Use No   Alcohol/week: 0.0 standard drinks of alcohol     Social History   Substance and Sexual Activity  Drug Use No    Social History   Socioeconomic History   Marital status: Divorced    Spouse name: Not on file   Number of children: 3   Years of education: Not on file   Highest education level: Not on file  Occupational History   Occupation: retired  Tobacco Use   Smoking status: Former    Packs/day: 1.00    Years: 45.00    Total pack years: 45.00    Types:  Cigarettes    Quit date: 02/20/2018    Years since quitting: 3.8   Smokeless tobacco: Never  Vaping Use   Vaping Use: Never used  Substance and Sexual Activity   Alcohol use: No    Alcohol/week: 0.0 standard drinks of alcohol   Drug use: No   Sexual activity: Not Currently  Other Topics Concern   Not on file  Social History Narrative   Not on file   Social Determinants of Health   Financial Resource Strain: Not on file  Food Insecurity: No Food Insecurity (12/07/2021)   Hunger Vital Sign    Worried About Running Out of Food in the Last Year: Never true    [place] in the Last Year: Never true  Transportation Needs: No Transportation Needs (12/07/2021)   PRAPARE - Hydrologist (Medical): No    Lack of Transportation (Non-Medical): No  Physical Activity: Not on file  Stress: Not on file  Social Connections: Not on file   Additional Social History:                         Sleep: Fair  Appetite:  Poor  Current Medications: Current Facility-Administered Medications  Medication Dose Route Frequency Provider Last Rate Last Admin   acetaminophen (TYLENOL) tablet 650 mg  650 mg Oral Q6H PRN Patrecia Pour, NP   650 mg at 12/08/21 0947   alum & mag hydroxide-simeth (MAALOX/MYLANTA) 200-200-20 MG/5ML suspension 30 mL  30 mL Oral Q4H PRN Patrecia Pour, NP       clonazePAM Bobbye [place]) tablet 0.5 mg  0.5 mg Oral Daily PRN Patrecia Pour, NP   0.5 mg at 12/12/21 0438   docusate sodium (COLACE) capsule 100 mg  100 mg Oral BID Reynald Woods T, MD       FLUoxetine (PROZAC) capsule 20 mg  20 mg Oral Daily Patrecia Pour, NP   20 mg at 12/12/21 5631   gabapentin (NEURONTIN) capsule 300 mg  300 mg Oral QHS Patrecia Pour, NP   300 mg at 12/11/21 2118   HYDROcodone-acetaminophen (NORCO) 10-325 MG per tablet 1 tablet  1 tablet Oral QID Patrecia Pour, NP   1 tablet at 12/12/21 1134   magnesium hydroxide (MILK OF MAGNESIA) suspension 30 mL  30  mL Oral Daily PRN Patrecia Pour, NP   30 mL at 12/08/21 1335   metoprolol tartrate (LOPRESSOR) tablet 12.5 mg  12.5 mg Oral BID Patrecia Pour, NP   12.5 mg at 12/12/21 4970    Lab Results: No results found for this or any previous visit (from the past 48 hour(s)).  Blood Alcohol level:  Lab Results  Component Value Date  ETH <10 11/22/2021   ETH <10 33/82/5053    Metabolic Disorder Labs: Lab Results  Component Value Date   HGBA1C 6.2 05/24/2021   No results found for: "PROLACTIN" Lab Results  Component Value Date   CHOL 190 05/24/2021   TRIG 65.0 05/24/2021   HDL 90.80 05/24/2021   CHOLHDL 2 05/24/2021   VLDL 13.0 05/24/2021   LDLCALC 86 05/24/2021   LDLCALC 82 01/09/2017    Physical Findings: AIMS:  , ,  ,  ,    CIWA:    COWS:     Musculoskeletal: Strength & Muscle Tone: within normal limits Gait & Station: normal Patient leans: N/A  Psychiatric Specialty Exam:  Presentation  General Appearance: Casual  Eye Contact:Fair  Speech:Clear and Coherent  Speech Volume:Normal  Handedness:No data recorded  Mood and Affect  Mood:Euthymic  Affect:Blunt   Thought Process  Thought Processes:Goal Directed  Descriptions of Associations:Tangential  Orientation:Full (Time, Place and Person)  Thought Content:Logical  History of Schizophrenia/Schizoaffective disorder:No  Duration of Psychotic Symptoms:No data recorded Hallucinations:No data recorded Ideas of Reference:None  Suicidal Thoughts:No data recorded Homicidal Thoughts:No data recorded  Sensorium  Memory:Recent Good; Immediate Fair; Remote Fair  Judgment:Poor  Insight:Shallow   Executive Functions  Concentration:Fair  Attention Span:Fair  [place]   Psychomotor Activity  Psychomotor Activity:No data recorded  Assets  Assets:Social Support; Resilience; Physical Health; Financial Resources/Insurance   Sleep  Sleep:No data  recorded   Physical Exam: Physical Exam Vitals reviewed.  Constitutional:      Appearance: She is ill-appearing.  HENT:     Head: Normocephalic and atraumatic.     Mouth/Throat:     Pharynx: Oropharynx is clear.  Eyes:     Pupils: Pupils are equal, round, and reactive to light.  Cardiovascular:     Rate and Rhythm: Normal rate and regular rhythm.  Pulmonary:     Effort: Pulmonary effort is normal.     Breath sounds: Normal breath sounds.  Abdominal:     General: Abdomen is flat.     Palpations: Abdomen is soft.  Musculoskeletal:        General: Normal range of motion.  Skin:    General: Skin is warm and dry.  Neurological:     General: No focal deficit present.     Mental Status: She is alert. Mental status is at baseline.  Psychiatric:        Attention and Perception: She is inattentive.        Mood and Affect: Mood normal. Affect is blunt.        Speech: Speech is delayed.        Behavior: Behavior is slowed.        Thought Content: Thought content normal.    Review of Systems  Constitutional: Negative.   HENT: Negative.    Eyes: Negative.   Respiratory: Negative.    Cardiovascular: Negative.   Gastrointestinal: Negative.   Musculoskeletal:  Positive for back pain and myalgias.  Skin: Negative.   Neurological: Negative.   Psychiatric/Behavioral:  Positive for depression. Negative for suicidal ideas.    Blood pressure (!) 118/46, pulse 66, temperature 98.9 F (37.2 C), temperature source Oral, resp. rate 18, height 5' (1.524 m), weight 57 kg, SpO2 99 %. Body mass index is 24.54 kg/m.   Treatment Plan Summary: Plan continue medication.  I am checking a comprehensive metabolic and CBC today to look for any signs of new lab abnormalities or bleeding.  Added Colace  in case some of this is constipation.  Continue other medicine for now.  No change to pain medicine  Alethia Berthold, MD 12/12/2021, 12:20 PM

## 2021-12-12 NOTE — Assessment & Plan Note (Addendum)
New onset right upper quadrant pain about the area of her rib cage.  On scheduled hydrocodone.  We will add as needed anti-inflammatory given possible inflammatory nature of pain.  Ice pack as well. She has no elevated WBC, no fever, no change in bowel habits.  She is not eating but is not related to this.  This pain is related to movement and taking a deep breath.

## 2021-12-12 NOTE — Progress Notes (Signed)
Patient came to dining hall and reported she did not like food. She reports '' this meat is not good, it's too tough. It gets stuck in my teeth. '' Writer offered to cut food or to call and get patient separate entree. Patient was offered other items on her tray and she states '' that lettuce is too green I like white iceberg lettuce only, and they sent me a potato, I grew up on potatoes and my stomach is hurting! '' A new tray of fish and mashed potatos sent for pt. Patient groaning and reporting stomach pain. Patient given prn maalox and also encouraged to take scheduled stool softener. Patient refused stating '' nooo this is my appendix. '' Patient states she had BM today but none seen by staff as pt on 1.1.  Patient offered gingerale and accepted some of maalox but would not drink entire dose as pt states '' that is terrible and burning my mouth. Now it is going to ruin the fish.'' Vitals obtain and all WNL.  Patient has had multiple complaints. MD aware of patient complaints of pain. Patients peers attempting to console patient and patient offered assistance and ambulated in no acute distress back to bedroom with 1.1. sitter.  Will con't to monitor.

## 2021-12-12 NOTE — [facility] Group Notes (Signed)
Pt did not attend recreation group in courtyard

## 2021-12-12 NOTE — Assessment & Plan Note (Signed)
Continue metoprolol, BP is fairly well controlled.

## 2021-12-12 NOTE — Progress Notes (Signed)
Patient daughter arrived for visitation. Patient family voiced frustrations that a provider has not updated her since admission and that she would like a phone call as soon as possible to speak with an MD. Patient family also asked for manager contact information and patient experience line to express concerns related to patient care. Both numbers were provided to family.

## 2021-12-12 NOTE — Group Note (Signed)
[facility] LCSW Group Therapy Note   Group Date: 12/12/2021 Start Time: 1330 End Time: 1430   Type of Therapy and Topic: Group Therapy: Avoiding Self-Sabotaging and Enabling Behaviors  Participation Level: Did Not Attend  Mood:  Description of Group:  In this group, patients will learn how to identify obstacles, self-sabotaging and enabling behaviors, as well as: what are they, why do we do them and what needs these behaviors meet. Discuss unhealthy relationships and how to have positive healthy boundaries with those that sabotage and enable. Explore aspects of self-sabotage and enabling in yourself and how to limit these self-destructive behaviors in everyday life.   Therapeutic Goals: 1. Patient will identify one obstacle that relates to self-sabotage and enabling behaviors 2. Patient will identify one personal self-sabotaging or enabling behavior they did prior to admission 3. Patient will state a plan to change the above identified behavior 4. Patient will demonstrate ability to communicate their needs through discussion and/or role play.    Summary of Patient Progress: Patient did not attend group despite personally being invited by this clinician.   Therapeutic Modalities:  Cognitive Behavioral Therapy Person-Centered Therapy Motivational Interviewing    Rozann Lesches, LCSW

## 2021-12-12 NOTE — Progress Notes (Signed)
Patient continues to sit up and is falling asleep while in that state.  The writer has asked her several times to lie down in the bed because of the way she is falling asleep while sitting up.  She is nodding off and her body is shifting forward.  The writer is fearful she will fall.  Patient has been asked several times to lie down in the bed but she is refusing.

## 2021-12-12 NOTE — Assessment & Plan Note (Signed)
Per psychiatry team

## 2021-12-12 NOTE — [facility] Group Notes (Signed)
Pt did not attend psychoED group.

## 2021-12-12 NOTE — Plan of Care (Signed)

## 2021-12-12 NOTE — Progress Notes (Signed)
Pt presents with depressed mood, affect congruent. Tina Bailey speaks very soft, slow and forwards little. She appears very withdrawn and states she does not feel like getting up this am to come to dining hall. 02 cannisters were ordered from supply to ensure patient may be mobile on the unit. Patient remains 1-1 with sitter for safety. Pt is high fall risk and is noted to be very kyphotic and requires walker for mobility. Pt compliant with am medications. Does endorse chronic pain and scheduled am medications given. Pt is safe, will con't to monitor.

## 2021-12-13 ENCOUNTER — Ambulatory Visit (HOSPITAL_BASED_OUTPATIENT_CLINIC_OR_DEPARTMENT_OTHER): Payer: Medicare HMO | Admitting: Family

## 2021-12-13 MED ORDER — RISPERIDONE 0.25 MG PO TABS
0.2500 mg | ORAL_TABLET | ORAL | Status: DC
  Administered 2021-12-13 – 2021-12-17 (×8): 0.25 mg via ORAL
  Filled 2021-12-13 (×9): qty 1

## 2021-12-13 MED ORDER — MIRTAZAPINE 15 MG PO TABS
15.0000 mg | ORAL_TABLET | Freq: Every day | ORAL | Status: DC
  Administered 2021-12-13 – 2021-12-14 (×2): 15 mg via ORAL
  Filled 2021-12-13 (×2): qty 1

## 2021-12-13 NOTE — [facility] Group Notes (Signed)
Patient unable to attend group therapy today. Patient states that she does not feel up to it.

## 2021-12-13 NOTE — Progress Notes (Signed)
[facility] MD Progress Note  12/13/2021 11:53 AM Tina Bailey  MRN:  [id number] Subjective: Tina Bailey is seen on rounds.  She complains of some abdominal pain but denies that she has constipation.  Her daughter called and told the nurses that she was on Remeron and Risperdal which was helpful.  Apparently, it was not restarted when she got here.  She has a history of COPD, CHF, osteoporosis, hepatocellular carcinoma, hypertension and compression fracture of the spine with chronic pain.  Apparently, the family is going to move her closer to her son in [place] when she is discharged.  She was seen by the hospitalist for left upper quadrant pain but nothing came about this and she does not have an elevated white count.  She did have a CT of the abdomen done approximately a week and a half ago which did not show any acute findings and bilateral nephrolithiasis.  Principal Problem: Severe recurrent major depression without psychotic features ([place]) Diagnosis: Principal Problem:   Severe recurrent major depression without psychotic features ([place]) Active Problems:   Hepatocellular carcinoma ([place])   Chronic pain   Essential hypertension   Major depressive disorder, recurrent severe without psychotic features ([place])  Total Time spent with patient: 15 minutes  Past Psychiatric History: Long history of depression.  History of suicide attempt.  Past Medical History:  Past Medical History:  Diagnosis Date   Arthritis    DDD. Right shoulder"is frozen"-limited ROM. osteoporosis.   Blue toe syndrome ([place]) 11/27/2014   Dr. Claudia Pollock evaluating   Chronic diastolic CHF (congestive heart failure) (HCC)    Chronic respiratory failure (HCC)    Complication of anesthesia    COPD (chronic obstructive pulmonary disease) ([place])    Fibromyalgia    Fracture of rib of right side    hx "osteoporosis"- states her dog nudge her on the side, next day developed great pain and was told has a fracture rib.   Liver cancer  ([place]) 12/2014   Macular degeneration    R eye   Osteoporosis    PONV (postoperative nausea and vomiting)    nausea,severe vomiting after 01-13-15 portal vein embolization   Productive cough    Retina disorder    L eye, vision distorted, edema   Right atrial mass    Spine fracture due to birth trauma    Thoracic aortic aneurysm (TAA) ([place])    TMJ disease    Wears glasses     Past Surgical History:  Procedure Laterality Date   ANGIOPLASTY  2008   no stents required, no follow-up with cardiologist, no recurrent chest pain   CATARACT EXTRACTION, BILATERAL Bilateral    ESOPHAGOGASTRODUODENOSCOPY (EGD) WITH PROPOFOL N/A 06/25/2015   Procedure: ESOPHAGOGASTRODUODENOSCOPY (EGD) WITH PROPOFOL;  Surgeon: Milus Banister, MD;  Location: [facility];  Service: Endoscopy;  Laterality: N/A;  stent removal    EYE SURGERY     cornea surgery   INTRAMEDULLARY (IM) NAIL INTERTROCHANTERIC Right 12/04/2017   Procedure: INTRAMEDULLARY (IM) NAIL INTERTROCHANTRIC;  Surgeon: Rod Can, MD;  Location: [facility];  Service: Orthopedics;  Laterality: Right;   IR RADIOLOGIST EVAL & MGMT  03/29/2017   KIDNEY STONE SURGERY     LAPAROSCOPIC PARTIAL HEPATECTOMY N/A 03/04/2015   Procedure: DIAGNOSTIC LAPAROSCOPY, EXTENDED RIGHT HEPATECTOMY, WITH INTRAOPERATIVE ULTRASOUND;  Surgeon: Stark Klein, MD;  Location: [facility];  Service: General;  Laterality: N/A;   PARTIAL HYSTERECTOMY     portal vein embolization     01-13-15 -Dr. Cathlean Sauer.   Family History:  Family History  Problem Relation Age of Onset   Hypertension Mother    Atrial fibrillation Mother    Heart disease Mother        after age 32   Breast cancer Mother    Stroke Father    Dementia Father    Peptic Ulcer Father    Heart disease Brother        After age 2- A-Fib   Heart attack Brother     Social History:  Social History   Substance and Sexual Activity  Alcohol Use No   Alcohol/week: 0.0 standard drinks of alcohol     Social  History   Substance and Sexual Activity  Drug Use No    Social History   Socioeconomic History   Marital status: Divorced    Spouse name: Not on file   Number of children: 3   Years of education: Not on file   Highest education level: Not on file  Occupational History   Occupation: retired  Tobacco Use   Smoking status: Former    Packs/day: 1.00    Years: 45.00    Total pack years: 45.00    Types: Cigarettes    Quit date: 02/20/2018    Years since quitting: 3.8   Smokeless tobacco: Never  Vaping Use   Vaping Use: Never used  Substance and Sexual Activity   Alcohol use: No    Alcohol/week: 0.0 standard drinks of alcohol   Drug use: No   Sexual activity: Not Currently  Other Topics Concern   Not on file  Social History Narrative   Not on file   Social Determinants of Health   Financial Resource Strain: Not on file  Food Insecurity: No Food Insecurity (12/07/2021)   Hunger Vital Sign    Worried About Running Out of Food in the Last Year: Never true    Ran Out of Food in the Last Year: Never true  Transportation Needs: No Transportation Needs (12/07/2021)   PRAPARE - Hydrologist (Medical): No    Lack of Transportation (Non-Medical): No  Physical Activity: Not on file  Stress: Not on file  Social Connections: Not on file   Additional Social History:                         Sleep: Good  Appetite:  Good  Current Medications: Current Facility-Administered Medications  Medication Dose Route Frequency Provider Last Rate Last Admin   acetaminophen (TYLENOL) tablet 650 mg  650 mg Oral Q6H PRN Patrecia Pour, NP   650 mg at 12/08/21 0947   alum & mag hydroxide-simeth (MAALOX/MYLANTA) 200-200-20 MG/5ML suspension 30 mL  30 mL Oral Q4H PRN Patrecia Pour, NP   30 mL at 12/12/21 1224   clonazePAM (KLONOPIN) tablet 0.5 mg  0.5 mg Oral Daily PRN Patrecia Pour, NP   0.5 mg at 12/12/21 1954   docusate sodium (COLACE) capsule 100 mg   100 mg Oral BID Clapacs, John T, MD   100 mg at 12/13/21 1015   FLUoxetine (PROZAC) capsule 20 mg  20 mg Oral Daily Patrecia Pour, NP   20 mg at 12/13/21 1015   gabapentin (NEURONTIN) capsule 300 mg  300 mg Oral QHS Patrecia Pour, NP   300 mg at 12/12/21 2200   HYDROcodone-acetaminophen (NORCO) 10-325 MG per tablet 1 tablet  1 tablet Oral QID Patrecia Pour, NP   1 tablet at 12/13/21  0801   magnesium hydroxide (MILK OF MAGNESIA) suspension 30 mL  30 mL Oral Daily PRN Patrecia Pour, NP   30 mL at 12/08/21 1335   meloxicam (MOBIC) tablet 7.5 mg  7.5 mg Oral Daily PRN Donnamae Jude, MD       metoprolol tartrate (LOPRESSOR) tablet 12.5 mg  12.5 mg Oral BID Patrecia Pour, NP   12.5 mg at 12/13/21 1016    Lab Results:  Results for orders placed or performed during the hospital encounter of 12/07/21 (from the past 48 hour(s))  Comprehensive metabolic panel     Status: Abnormal   Collection Time: 12/12/21  1:22 PM  Result Value Ref Range   Sodium 131 (L) 135 - 145 mmol/L   Potassium 4.6 3.5 - 5.1 mmol/L   Chloride 89 (L) 98 - 111 mmol/L   CO2 34 (H) 22 - 32 mmol/L   Glucose, Bld 107 (H) 70 - 99 mg/dL    Comment: Glucose reference range applies only to samples taken after fasting for at least 8 hours.   BUN 14 8 - 23 mg/dL   Creatinine, Ser 0.54 0.44 - 1.00 mg/dL   Calcium 8.5 (L) 8.9 - 10.3 mg/dL   Total Protein 6.3 (L) 6.5 - 8.1 g/dL   Albumin 3.8 3.5 - 5.0 g/dL   AST 16 15 - 41 U/L   ALT 13 0 - 44 U/L   Alkaline Phosphatase 71 38 - 126 U/L   Total Bilirubin 0.8 0.3 - 1.2 mg/dL   GFR, Estimated >60 >60 mL/min    Comment: (NOTE) Calculated using the CKD-EPI Creatinine Equation (2021)    Anion gap 8 5 - 15    Comment: Performed at [facility], [address] [place], [place] [address]  CBC with Differential/Platelet     Status: Abnormal   Collection Time: 12/12/21  1:22 PM  Result Value Ref Range   WBC 6.7 4.0 - 10.5 K/uL   RBC 3.58 (L) 3.87 - 5.11 MIL/uL    Hemoglobin 10.9 (L) 12.0 - 15.0 g/dL   HCT 33.6 (L) 36.0 - 46.0 %   MCV 93.9 80.0 - 100.0 fL   MCH 30.4 26.0 - 34.0 pg   MCHC 32.4 30.0 - 36.0 g/dL   RDW 11.9 11.5 - 15.5 %   Platelets 185 150 - 400 K/uL   nRBC 0.0 0.0 - 0.2 %   Neutrophils Relative % 71 %   Neutro Abs 4.8 1.7 - 7.7 K/uL   Lymphocytes Relative 22 %   Lymphs Abs 1.5 0.7 - 4.0 K/uL   Monocytes Relative 6 %   Monocytes Absolute 0.4 0.1 - 1.0 K/uL   Eosinophils Relative 1 %   Eosinophils Absolute 0.1 0.0 - 0.5 K/uL   Basophils Relative 0 %   Basophils Absolute 0.0 0.0 - 0.1 K/uL   Immature Granulocytes 0 %   Abs Immature Granulocytes 0.02 0.00 - 0.07 K/uL    Comment: Performed at [facility], [address] [place], [place] [address]  Lipase, blood     Status: None   Collection Time: 12/12/21  1:22 PM  Result Value Ref Range   Lipase 22 11 - 51 U/L    Comment: Performed at [facility], [address] [place], [place] [address]    Blood Alcohol level:  Lab Results  Component Value Date   [facility] <10 11/22/2021   ETH <10 94/49/6759    Metabolic Disorder Labs: Lab Results  Component Value  Date   HGBA1C 6.2 05/24/2021   No results found for: "PROLACTIN" Lab Results  Component Value Date   CHOL 190 05/24/2021   TRIG 65.0 05/24/2021   HDL 90.80 05/24/2021   CHOLHDL 2 05/24/2021   VLDL 13.0 05/24/2021   LDLCALC 86 05/24/2021   LDLCALC 82 01/09/2017    Physical Findings: AIMS:  , ,  ,  ,    CIWA:    COWS:     Musculoskeletal: Strength & Muscle Tone: within normal limits Gait & Station: unsteady Patient leans: N/A  Psychiatric Specialty Exam:  Presentation  General Appearance: Casual  Eye Contact:Fair  Speech:Clear and Coherent  Speech Volume:Normal  Handedness:No data recorded  Mood and Affect  Mood:Euthymic  Affect:Blunt   Thought Process  Thought Processes:Goal Directed  Descriptions of Associations:Tangential  Orientation:Full (Time, Place and  Person)  Thought Content:Logical  History of Schizophrenia/Schizoaffective disorder:No  Duration of Psychotic Symptoms:No data recorded Hallucinations:No data recorded Ideas of Reference:None  Suicidal Thoughts:No data recorded Homicidal Thoughts:No data recorded  Sensorium  Memory:Recent Good; Immediate Fair; Remote Fair  Judgment:Poor  Insight:Shallow   Executive Functions  Concentration:Fair  Attention Span:Fair  [place]   Psychomotor Activity  Psychomotor Activity:No data recorded  Assets  Assets:Social Support; Resilience; Physical Health; Financial Resources/Insurance   Sleep  Sleep:No data recorded   Physical Exam: Physical Exam Vitals and nursing note reviewed.  Constitutional:      Appearance: Normal appearance. She is normal weight.  Neurological:     General: No focal deficit present.     Mental Status: She is alert and oriented to person, place, and time.  Psychiatric:        Attention and Perception: Attention and perception normal.        Mood and Affect: Mood is depressed. Affect is flat.        Speech: Speech normal.        Behavior: Behavior normal. Behavior is cooperative.        Thought Content: Thought content normal.        Cognition and Memory: Cognition is impaired. Memory is impaired.        Judgment: Judgment normal.    Review of Systems  Constitutional: Negative.   HENT: Negative.    Eyes: Negative.   Respiratory: Negative.    Cardiovascular: Negative.   Gastrointestinal: Negative.   Genitourinary: Negative.   Musculoskeletal: Negative.   Skin: Negative.   Neurological: Negative.   Endo/Heme/Allergies: Negative.   Psychiatric/Behavioral: Negative.     Blood pressure 139/71, pulse 68, temperature 98.8 F (37.1 C), temperature source Oral, resp. rate 19, height 5' (1.524 m), weight 57 kg, SpO2 100 %. Body mass index is 24.54 kg/m.   Treatment Plan Summary: Daily contact  with patient to assess and evaluate symptoms and progress in treatment, Medication management, and Plan restart Risperdal 0.25 mg twice a day and Remeron 15 mg at bedtime.  Parks Ranger, DO 12/13/2021, 11:53 AM

## 2021-12-13 NOTE — Progress Notes (Signed)
Patient stable and in good condition. Patient denies pain this morning, remains on 1:1 and on oxygen. Will continue to monitor and update as needed.

## 2021-12-13 NOTE — [facility] Group Notes (Addendum)
Patient attended recreational group in the courtyard with her 1:1 staff and portable oxygen tank in place. Patient showed positive interaction with staff and other patients by singing, dancing, and conversing amongst the group. Patient says that she enjoyed herself.

## 2021-12-13 NOTE — BH IP Treatment Plan (Signed)
Interdisciplinary Treatment and Diagnostic Plan Update  12/13/2021 Time of Session: 8:30AM Tina Bailey MRN: [id number]  Principal Diagnosis: Severe recurrent major depression without psychotic features [facility])  Secondary Diagnoses: Principal Problem:   Severe recurrent major depression without psychotic features ([place]) Active Problems:   Hepatocellular carcinoma ([place])   Chronic pain   Essential hypertension   Major depressive disorder, recurrent severe without psychotic features ([place])   Current Medications:  Current Facility-Administered Medications  Medication Dose Route Frequency Provider Last Rate Last Admin   acetaminophen (TYLENOL) tablet 650 mg  650 mg Oral Q6H PRN Patrecia Pour, NP   650 mg at 12/08/21 0947   alum & mag hydroxide-simeth (MAALOX/MYLANTA) 200-200-20 MG/5ML suspension 30 mL  30 mL Oral Q4H PRN Patrecia Pour, NP   30 mL at 12/12/21 1224   clonazePAM (KLONOPIN) tablet 0.5 mg  0.5 mg Oral Daily PRN Patrecia Pour, NP   0.5 mg at 12/12/21 1954   docusate sodium (COLACE) capsule 100 mg  100 mg Oral BID Clapacs, John T, MD   100 mg at 12/13/21 1015   FLUoxetine (PROZAC) capsule 20 mg  20 mg Oral Daily Patrecia Pour, NP   20 mg at 12/13/21 1015   gabapentin (NEURONTIN) capsule 300 mg  300 mg Oral QHS Patrecia Pour, NP   300 mg at 12/12/21 2200   HYDROcodone-acetaminophen (NORCO) 10-325 MG per tablet 1 tablet  1 tablet Oral QID Patrecia Pour, NP   1 tablet at 12/13/21 0801   magnesium hydroxide (MILK OF MAGNESIA) suspension 30 mL  30 mL Oral Daily PRN Patrecia Pour, NP   30 mL at 12/08/21 1335   meloxicam (MOBIC) tablet 7.5 mg  7.5 mg Oral Daily PRN Donnamae Jude, MD       metoprolol tartrate (LOPRESSOR) tablet 12.5 mg  12.5 mg Oral BID Patrecia Pour, NP   12.5 mg at 12/13/21 1016   PTA Medications: Medications Prior to Admission  Medication Sig Dispense Refill Last Dose   Budeson-Glycopyrrol-Formoterol (BREZTRI AEROSPHERE) 160-9-4.8 MCG/ACT AERO Take  2 puffs first thing in am and then another 2 puffs about 12 hours later. 5.9 g 11    Cholecalciferol (VITAMIN D3) 50 MCG (2000 UT) capsule Take 2,000 Units by mouth daily after lunch.      clonazePAM (KLONOPIN) 0.5 MG tablet Take 1 tablet (0.5 mg total) by mouth every 12 (twelve) hours as needed (Anxiety). 30 tablet 0    FLUoxetine (PROZAC) 20 MG capsule Take 1 capsule (20 mg total) by mouth daily. (Patient taking differently: Take 60 mg by mouth daily.) 30 capsule 3    gabapentin (NEURONTIN) 300 MG capsule Take 1 capsule (300 mg total) by mouth at bedtime. 30 capsule 3    HYDROcodone-acetaminophen (NORCO) 10-325 MG tablet Take 1 tablet by mouth in the morning, at noon, in the evening, and at bedtime.      metoprolol tartrate (LOPRESSOR) 25 MG tablet Take 0.5 tablets (12.5 mg total) by mouth 2 (two) times daily. 60 tablet 2    mirtazapine (REMERON) 15 MG tablet Take 1 tablet (15 mg total) by mouth at bedtime. (Patient not taking: Reported on 11/23/2021) 30 tablet 3    Multiple Vitamin (MULTIVITAMIN) tablet Take 1 tablet by mouth daily.      omeprazole (PRILOSEC) 20 MG capsule Take 20 mg by mouth daily. (Patient not taking: Reported on 11/23/2021)      OXYGEN Inhale 2 L into the lungs continuous.  polyethylene glycol (MIRALAX / GLYCOLAX) 17 g packet Take 17 g by mouth daily as needed for mild constipation. 14 each 0    risperiDONE (RISPERDAL) 0.25 MG tablet Take 1 tablet (0.25 mg total) by mouth 2 (two) times daily at 8 am and 4 pm. (Patient not taking: Reported on 11/23/2021) 60 tablet 3    senna (SENOKOT) 8.6 MG TABS tablet Take 1 tablet by mouth daily.       Patient Stressors: Health problems    Patient Strengths: Supportive family/friends   Treatment Modalities: Medication Management, Group therapy, Case management,  1 to 1 session with clinician, Psychoeducation, Recreational therapy.   Physician Treatment Plan for Primary Diagnosis: Severe recurrent major depression without psychotic  features ([place]) Long Term Goal(s): Improvement in symptoms so as ready for discharge   Short Term Goals: Ability to identify changes in lifestyle to reduce recurrence of condition will improve Ability to verbalize feelings will improve Ability to disclose and discuss suicidal ideas Ability to demonstrate self-control will improve Ability to identify and develop effective coping behaviors will improve Ability to maintain clinical measurements within normal limits will improve Compliance with prescribed medications will improve Ability to identify triggers associated with substance abuse/mental health issues will improve  Medication Management: Evaluate patient's response, side effects, and tolerance of medication regimen.  Therapeutic Interventions: 1 to 1 sessions, Unit Group sessions and Medication administration.  Evaluation of Outcomes: Progressing  Physician Treatment Plan for Secondary Diagnosis: Principal Problem:   Severe recurrent major depression without psychotic features ([place]) Active Problems:   Hepatocellular carcinoma ([place])   Chronic pain   Essential hypertension   Major depressive disorder, recurrent severe without psychotic features ([place])  Long Term Goal(s): Improvement in symptoms so as ready for discharge   Short Term Goals: Ability to identify changes in lifestyle to reduce recurrence of condition will improve Ability to verbalize feelings will improve Ability to disclose and discuss suicidal ideas Ability to demonstrate self-control will improve Ability to identify and develop effective coping behaviors will improve Ability to maintain clinical measurements within normal limits will improve Compliance with prescribed medications will improve Ability to identify triggers associated with substance abuse/mental health issues will improve     Medication Management: Evaluate patient's response, side effects, and tolerance of medication regimen.  Therapeutic  Interventions: 1 to 1 sessions, Unit Group sessions and Medication administration.  Evaluation of Outcomes: Progressing   RN Treatment Plan for Primary Diagnosis: Severe recurrent major depression without psychotic features ([place]) Long Term Goal(s): Knowledge of disease and therapeutic regimen to maintain health will improve  Short Term Goals: Ability to demonstrate self-control, Ability to participate in decision making will improve, Ability to verbalize feelings will improve, Ability to disclose and discuss suicidal ideas, Ability to identify and develop effective coping behaviors will improve, and Compliance with prescribed medications will improve  Medication Management: RN will administer medications as ordered by provider, will assess and evaluate patient's response and provide education to patient for prescribed medication. RN will report any adverse and/or side effects to prescribing provider.  Therapeutic Interventions: 1 on 1 counseling sessions, Psychoeducation, Medication administration, Evaluate responses to treatment, Monitor vital signs and CBGs as ordered, Perform/monitor CIWA, COWS, AIMS and Fall Risk screenings as ordered, Perform wound care treatments as ordered.  Evaluation of Outcomes: Progressing   LCSW Treatment Plan for Primary Diagnosis: Severe recurrent major depression without psychotic features ([place]) Long Term Goal(s): Safe transition to appropriate next level of care at discharge, Engage patient in therapeutic group addressing  interpersonal concerns.  Short Term Goals: Engage patient in aftercare planning with referrals and resources, Increase social support, Increase ability to appropriately verbalize feelings, Increase emotional regulation, Facilitate acceptance of mental health diagnosis and concerns, and Increase skills for wellness and recovery  Therapeutic Interventions: Assess for all discharge needs, 1 to 1 time with Social worker, Explore available resources  and support systems, Assess for adequacy in community support network, Educate family and significant other(s) on suicide prevention, Complete Psychosocial Assessment, Interpersonal group therapy.  Evaluation of Outcomes: Progressing   Progress in Treatment: Attending groups: No. Participating in groups: No. Taking medication as prescribed: Yes. Toleration medication: Yes. Family/Significant other contact made: Yes, individual(s) contacted:  SPE completed with the patient.  Patient understands diagnosis: Yes. Discussing patient identified problems/goals with staff: Yes. Medical problems stabilized or resolved: Yes. Denies suicidal/homicidal ideation: Yes. Issues/concerns per patient self-inventory: No. Other: none  New problem(s) identified: No, Describe:  none  Update 12/13/2021:  No change at this time.   New Short Term/Long Term Goal(s): medication management for mood stabilization; elimination of SI thoughts; development of comprehensive mental wellness/sobriety plan.  Update 12/13/2021:  No change at this time.   Patient Goals:  "to find out what makes me tick" Update 12/13/2021:  No change at this time.   Discharge Plan or Barriers: CSW to assist patient in development of appropriate discharge plans.  At this time the patient is not able to return to her ALF. Update 12/13/2021:  Patient's daughter reports that the patient has a bed at a home in [place]. She reports that she will transport the patient at discharge.  Patient is aligned with this plan.    Reason for Continuation of Hospitalization: Anxiety Depression Medical Issues Medication stabilization Suicidal ideation   Estimated Length of Stay:  1-7 days Update 12/13/2021:  TBD  Last 3 [place] Suicide Severity Risk Score: Flowsheet Row Admission (Current) from 12/07/2021 in [place] ED to Hosp-Admission (Discharged) from 11/22/2021 in [place] ED from 07/25/2021 in [place] DEPT  C-SSRS RISK CATEGORY No Risk Error: Q7 should not be populated when Q6 is No High Risk       Last PHQ 2/9 Scores:    11/23/2021   11:15 AM 04/22/2021    1:41 PM 04/19/2021    2:56 PM  Depression screen PHQ 2/9  Decreased Interest 3 0 0  Down, Depressed, Hopeless 3 0 0  PHQ - 2 Score 6 0 0  Altered sleeping 1 0 0  Tired, decreased energy 2 0 0  Change in appetite 3 0 0  Feeling bad or failure about yourself  3 0 0  Trouble concentrating 3 0 0  Moving slowly or fidgety/restless 1 0 0  Suicidal thoughts 3 0 0  PHQ-9 Score 22 0 0  Difficult doing work/chores Very difficult  Not difficult at all    Scribe for Treatment Team: Rozann Lesches, LCSW 12/13/2021 10:35 AM

## 2021-12-13 NOTE — Progress Notes (Signed)
   12/13/21 0400  Psych Admission Type (Psych Patients Only)  Admission Status Voluntary  Psychosocial Assessment  Patient Complaints Anxiety  Eye Contact Fair  Facial Expression Anxious  Affect Anxious  Speech Soft  Interaction Cautious;Childlike;Guarded  Motor Activity Slow  Appearance/Hygiene Unremarkable  Behavior Characteristics Cooperative;Anxious  Mood Depressed  Thought Process  Coherency Loose associations  Content Blaming others  Delusions None reported or observed  Perception WDL  Hallucination None reported or observed  Judgment Impaired  Confusion Mild  Danger to Self  Current suicidal ideation? Denies  Agreement Not to Harm Self Yes  Description of Agreement verbal  Danger to Others  Danger to Others None reported or observed

## 2021-12-13 NOTE — Plan of Care (Signed)
  Problem: Education: Goal: Knowledge of General Education information will improve Description: Including pain rating scale, medication(s)/side effects and non-pharmacologic comfort measures Outcome: Progressing   Problem: Clinical Measurements: Goal: Will remain free from infection Outcome: Progressing Goal: Respiratory complications will improve Outcome: Progressing Goal: Cardiovascular complication will be avoided Outcome: Progressing   Problem: Activity: Goal: Risk for activity intolerance will decrease Outcome: Progressing   Problem: Nutrition: Goal: Adequate nutrition will be maintained Outcome: Progressing   Problem: Coping: Goal: Level of anxiety will decrease Outcome: Progressing   Problem: Pain Managment: Goal: General experience of comfort will improve Outcome: Progressing   Problem: Safety: Goal: Ability to remain free from injury will improve Outcome: Progressing   Problem: Coping: Goal: Coping ability will improve Outcome: Progressing

## 2021-12-13 NOTE — Group Note (Signed)
[facility] LCSW Group Therapy Note    Group Date: 12/13/2021 Start Time: 1310 End Time: 1410  Type of Therapy and Topic:  Group Therapy:  Overcoming Obstacles  Participation Level:  [facility] PARTICIPATION LEVEL: Did Not Attend  Mood:  Description of Group:   In this group patients will be encouraged to explore what they see as obstacles to their own wellness and recovery. They will be guided to discuss their thoughts, feelings, and behaviors related to these obstacles. The group will process together ways to cope with barriers, with attention given to specific choices patients can make. Each patient will be challenged to identify changes they are motivated to make in order to overcome their obstacles. This group will be process-oriented, with patients participating in exploration of their own experiences as well as giving and receiving support and challenge from other group members.  Therapeutic Goals: 1. Patient will identify personal and current obstacles as they relate to admission. 2. Patient will identify barriers that currently interfere with their wellness or overcoming obstacles.  3. Patient will identify feelings, thought process and behaviors related to these barriers. 4. Patient will identify two changes they are willing to make to overcome these obstacles:    Summary of Patient Progress Patient declined group though invited by this clinician,   Therapeutic Modalities:   Cognitive Behavioral Therapy Solution Focused Therapy Motivational Interviewing Relapse Prevention Therapy   Rozann Lesches, LCSW

## 2021-12-13 NOTE — Progress Notes (Signed)
0700: Patient is awake in her room. Oxygen @ 2L in place via [place] cont.   0800: Patient is eating breakfast. Consumed less than 25% of her meal. Medication administered.   0900: Patient is in her bedroom interacting with staff.   1000: Patient is in her bedroom. Medication administered.   1100: Patient is interacting well with her 1:1 staff.   1200: Patient is eating lunch consuming less than 25% of her meal. Medication administered.   1300:Patient is in her bedroom. Respirations even/unlabored.   1400: Patient is in her room. Respirations even/unlabored.   1500: Patient is in her bedroom interacting with staff.    1600: Patient is in the dayroom getting ready for dinner. Medications administered.   1700: Patient is in the dayroom. She consumed 50% of her meal.   1800: Patient attended recreational group in the courtyard.  Patient is A+O x 3. She denies SI/HI/AVH. She denies anxiety. Her mood is sad stating, "Why won't God just take me?" Therapeutic communication performed. Fair appetite. Medication compliant. 1:1 order in place for 2L cont oxygen.

## 2021-12-13 NOTE — BH Assessment (Signed)
1905 Patient daughter up to desk upset that pt. Had not received Mobic that was ordered by the doctor. She wanted her mother to received the medication as soon as it came from the pharmacy.  2008 Patient was given Mobic. 7.5 mg per her requested over receiving schedule Norco 10-325 mg. She is rating her pain at 5 (back pain chronic). Will re-assess pain.  2015 Patient is denying SI/HI, A/V hallucinations, depression. She complained of pain level 5 and was medicated at 2008 with Mobic 7.5 mg. Will continue to monitor patient for safety and further pain.  2030 Patient BP is 106/51 she is alert and oriented x 4. She is able to give correct date, season, the president, her location and time of day. Will recheck VS prior to HS medication.  2020 Patient is screaming, crying without visible tears and is demanding Narco 10-325 mg that she did not take at 2000. She is receiving several sedating medications and is refusing medication education at this time. Per her choice patient she is opting to wait to take her BP medication. This writer will recheck BP  in one hour to administer BP medication.   2335 Patient is approached for recheck of BP lying BP is 111/45, 61 R 18.  Recheck of BP sitting is 144/68. She remains alert and oriented x 4 Denying pain states she is having trouble falling asleep. This writer is holding BP medication due to lying BP results. Will continue to monitor patient for abnormal BP, resp distress and safety.  [place] with NP Kennyth Lose T. Regarding patients decisions regarding medication refusals and requests.  NP response was that "The patient has a right to refuse medication, continue to monitor." Patient has a one on one staff at bedside. This Probation officer will continue to monitor patient for safety.  0700 Patient has rested quietly most of shift she has been awaken every two hours until 0530. She was alert and oriented x4 each time when she was awaken. One to one at bedside.

## 2021-12-14 ENCOUNTER — Encounter: Payer: Self-pay | Admitting: Internal Medicine

## 2021-12-14 MED ORDER — METHYLPHENIDATE HCL 5 MG PO TABS
2.5000 mg | ORAL_TABLET | Freq: Every day | ORAL | Status: DC
  Administered 2021-12-14: 2.5 mg via ORAL
  Filled 2021-12-14: qty 1

## 2021-12-14 NOTE — Group Note (Signed)
[facility] LCSW Group Therapy Note   Group Date: 12/14/2021 Start Time: 1330 End Time: 1430  Type of Therapy/Topic:  Group Therapy:  Feelings about Diagnosis  Participation Level:  None   Description of Group:    This group will allow patients to explore their thoughts and feelings about diagnoses they have received. Patients will be guided to explore their level of understanding and acceptance of these diagnoses. Facilitator will encourage patients to process their thoughts and feelings about the reactions of others to their diagnosis, and will guide patients in identifying ways to discuss their diagnosis with significant others in their lives. This group will be process-oriented, with patients participating in exploration of their own experiences as well as giving and receiving support and challenge from other group members.   Therapeutic Goals: 1. Patient will demonstrate understanding of diagnosis as evidence by identifying two or more symptoms of the disorder:  2. Patient will be able to express two feelings regarding the diagnosis 3. Patient will demonstrate ability to communicate their needs through discussion and/or role plays  Summary of Patient Progress: Patient declined to attend group despite being personally invited by this clinician.    Therapeutic Modalities:   Cognitive Behavioral Therapy Brief Therapy Feelings Identification    Rozann Lesches, LCSW

## 2021-12-14 NOTE — BH Assessment (Signed)
1000 Patient alert and oriented x 4 she is slightly drowsy and her Narco was held. Dr. Louis Meckel was notified of patient's status.   1130 Patient up sitting in the chair alert and watching TV. Patient is now eating lunch. One to one at side for safety. Report given to charge nurse.

## 2021-12-14 NOTE — Progress Notes (Signed)
[facility] MD Progress Note  12/14/2021 12:10 PM Tina Bailey  MRN:  [id number] Subjective: Tina Bailey is seen on rounds.  She is a little bit sleepy today but she restarted on her Remeron at night at the request of her daughter.  She did not get it the night before but her daughter stated that when she visited her last night she was coming out of it.  She might be able to benefit from a little bit of stimulant for her depression and drowsiness.  Principal Problem: Severe recurrent major depression without psychotic features ([place]) Diagnosis: Principal Problem:   Severe recurrent major depression without psychotic features ([place]) Active Problems:   Hepatocellular carcinoma ([place])   Chronic pain   Essential hypertension   Major depressive disorder, recurrent severe without psychotic features ([place])  Total Time spent with patient: 15 minutes  Past Psychiatric History:  Long history of depression.  History of suicide attempt.  Past Medical History:  Past Medical History:  Diagnosis Date   Arthritis    DDD. Right shoulder"is frozen"-limited ROM. osteoporosis.   Blue toe syndrome ([place]) 11/27/2014   Dr. Claudia Pollock evaluating   Chronic diastolic CHF (congestive heart failure) (HCC)    Chronic respiratory failure (HCC)    Complication of anesthesia    COPD (chronic obstructive pulmonary disease) ([place])    Fibromyalgia    Fracture of rib of right side    hx "osteoporosis"- states her dog nudge her on the side, next day developed great pain and was told has a fracture rib.   Liver cancer ([place]) 12/2014   Macular degeneration    R eye   Osteoporosis    PONV (postoperative nausea and vomiting)    nausea,severe vomiting after 01-13-15 portal vein embolization   Productive cough    Retina disorder    L eye, vision distorted, edema   Right atrial mass    Spine fracture due to birth trauma    Thoracic aortic aneurysm (TAA) ([place])    TMJ disease    Wears glasses     Past Surgical History:   Procedure Laterality Date   ANGIOPLASTY  2008   no stents required, no follow-up with cardiologist, no recurrent chest pain   CATARACT EXTRACTION, BILATERAL Bilateral    ESOPHAGOGASTRODUODENOSCOPY (EGD) WITH PROPOFOL N/A 06/25/2015   Procedure: ESOPHAGOGASTRODUODENOSCOPY (EGD) WITH PROPOFOL;  Surgeon: Milus Banister, MD;  Location: [facility] END[facility];  Service: Endoscopy;  Laterality: N/A;  stent removal    EYE SURGERY     cornea surgery   INTRAMEDULLARY (IM) NAIL INTERTROCHANTERIC Right 12/04/2017   Procedure: INTRAMEDULLARY (IM) NAIL INTERTROCHANTRIC;  Surgeon: Rod Can, MD;  Location: [facility] ORS;  Service: Orthopedics;  Laterality: Right;   IR RADIOLOGIST EVAL & MGMT  03/29/2017   KIDNEY STONE SURGERY     LAPAROSCOPIC PARTIAL HEPATECTOMY N/A 03/04/2015   Procedure: DIAGNOSTIC LAPAROSCOPY, EXTENDED RIGHT HEPATECTOMY, WITH INTRAOPERATIVE ULTRASOUND;  Surgeon: Stark Klein, MD;  Location: [facility];  Service: General;  Laterality: N/A;   PARTIAL HYSTERECTOMY     portal vein embolization     01-13-15 -Dr. Cathlean Sauer.   Family History:  Family History  Problem Relation Age of Onset   Hypertension Mother    Atrial fibrillation Mother    Heart disease Mother        after age 102   Breast cancer Mother    Stroke Father    Dementia Father    Peptic Ulcer Father    Heart disease Brother  After age 34- A-Fib   Heart attack Brother     Social History:  Social History   Substance and Sexual Activity  Alcohol Use No   Alcohol/week: 0.0 standard drinks of alcohol     Social History   Substance and Sexual Activity  Drug Use No    Social History   Socioeconomic History   Marital status: Divorced    Spouse name: Not on file   Number of children: 3   Years of education: Not on file   Highest education level: Not on file  Occupational History   Occupation: retired  Tobacco Use   Smoking status: Former    Packs/day: 1.00    Years: 45.00    Total pack years: 45.00     Types: Cigarettes    Quit date: 02/20/2018    Years since quitting: 3.8   Smokeless tobacco: Never  Vaping Use   Vaping Use: Never used  Substance and Sexual Activity   Alcohol use: No    Alcohol/week: 0.0 standard drinks of alcohol   Drug use: No   Sexual activity: Not Currently  Other Topics Concern   Not on file  Social History Narrative   Not on file   Social Determinants of Health   Financial Resource Strain: Not on file  Food Insecurity: No Food Insecurity (12/07/2021)   Hunger Vital Sign    Worried About Running Out of Food in the Last Year: Never true    Ran Out of Food in the Last Year: Never true  Transportation Needs: No Transportation Needs (12/07/2021)   PRAPARE - Hydrologist (Medical): No    Lack of Transportation (Non-Medical): No  Physical Activity: Not on file  Stress: Not on file  Social Connections: Not on file   Additional Social History:                         Sleep: Good  Appetite:  Good  Current Medications: Current Facility-Administered Medications  Medication Dose Route Frequency Provider Last Rate Last Admin   acetaminophen (TYLENOL) tablet 650 mg  650 mg Oral Q6H PRN Patrecia Pour, NP   650 mg at 12/08/21 0947   alum & mag hydroxide-simeth (MAALOX/MYLANTA) 200-200-20 MG/5ML suspension 30 mL  30 mL Oral Q4H PRN Patrecia Pour, NP   30 mL at 12/12/21 1224   clonazePAM (KLONOPIN) tablet 0.5 mg  0.5 mg Oral Daily PRN Patrecia Pour, NP   0.5 mg at 12/12/21 1954   docusate sodium (COLACE) capsule 100 mg  100 mg Oral BID Clapacs, John T, MD   100 mg at 12/13/21 2218   FLUoxetine (PROZAC) capsule 20 mg  20 mg Oral Daily Patrecia Pour, NP   20 mg at 12/14/21 0901   gabapentin (NEURONTIN) capsule 300 mg  300 mg Oral QHS Patrecia Pour, NP   300 mg at 12/13/21 2217   HYDROcodone-acetaminophen (NORCO) 10-325 MG per tablet 1 tablet  1 tablet Oral QID Patrecia Pour, NP   1 tablet at 12/13/21 2229   magnesium  hydroxide (MILK OF MAGNESIA) suspension 30 mL  30 mL Oral Daily PRN Patrecia Pour, NP   30 mL at 12/08/21 1335   meloxicam (MOBIC) tablet 7.5 mg  7.5 mg Oral Daily PRN Donnamae Jude, MD   7.5 mg at 12/13/21 2008   methylphenidate (RITALIN) tablet 2.5 mg  2.5 mg Oral q1600 Parks Ranger, DO  metoprolol tartrate (LOPRESSOR) tablet 12.5 mg  12.5 mg Oral BID Patrecia Pour, NP   12.5 mg at 12/14/21 0858   mirtazapine (REMERON) tablet 15 mg  15 mg Oral QHS Parks Ranger, DO   15 mg at 12/13/21 2219   risperiDONE (RISPERDAL) tablet 0.25 mg  0.25 mg Oral [id number] Parks Ranger, DO   0.25 mg at 12/14/21 5409    Lab Results:  Results for orders placed or performed during the hospital encounter of 12/07/21 (from the past 48 hour(s))  Comprehensive metabolic panel     Status: Abnormal   Collection Time: 12/12/21  1:22 PM  Result Value Ref Range   Sodium 131 (L) 135 - 145 mmol/L   Potassium 4.6 3.5 - 5.1 mmol/L   Chloride 89 (L) 98 - 111 mmol/L   CO2 34 (H) 22 - 32 mmol/L   Glucose, Bld 107 (H) 70 - 99 mg/dL    Comment: Glucose reference range applies only to samples taken after fasting for at least 8 hours.   BUN 14 8 - 23 mg/dL   Creatinine, Ser 0.54 0.44 - 1.00 mg/dL   Calcium 8.5 (L) 8.9 - 10.3 mg/dL   Total Protein 6.3 (L) 6.5 - 8.1 g/dL   Albumin 3.8 3.5 - 5.0 g/dL   AST 16 15 - 41 U/L   ALT 13 0 - 44 U/L   Alkaline Phosphatase 71 38 - 126 U/L   Total Bilirubin 0.8 0.3 - 1.2 mg/dL   GFR, Estimated >60 >60 mL/min    Comment: (NOTE) Calculated using the CKD-EPI Creatinine Equation (2021)    Anion gap 8 5 - 15    Comment: Performed at [facility], [address] [place], [place] [address]  CBC with Differential/Platelet     Status: Abnormal   Collection Time: 12/12/21  1:22 PM  Result Value Ref Range   WBC 6.7 4.0 - 10.5 K/uL   RBC 3.58 (L) 3.87 - 5.11 MIL/uL   Hemoglobin 10.9 (L) 12.0 - 15.0 g/dL   HCT 33.6 (L) 36.0 - 46.0 %   MCV  93.9 80.0 - 100.0 fL   MCH 30.4 26.0 - 34.0 pg   MCHC 32.4 30.0 - 36.0 g/dL   RDW 11.9 11.5 - 15.5 %   Platelets 185 150 - 400 K/uL   nRBC 0.0 0.0 - 0.2 %   Neutrophils Relative % 71 %   Neutro Abs 4.8 1.7 - 7.7 K/uL   Lymphocytes Relative 22 %   Lymphs Abs 1.5 0.7 - 4.0 K/uL   Monocytes Relative 6 %   Monocytes Absolute 0.4 0.1 - 1.0 K/uL   Eosinophils Relative 1 %   Eosinophils Absolute 0.1 0.0 - 0.5 K/uL   Basophils Relative 0 %   Basophils Absolute 0.0 0.0 - 0.1 K/uL   Immature Granulocytes 0 %   Abs Immature Granulocytes 0.02 0.00 - 0.07 K/uL    Comment: Performed at [facility], [address] [place], [place] [address]  Lipase, blood     Status: None   Collection Time: 12/12/21  1:22 PM  Result Value Ref Range   Lipase 22 11 - 51 U/L    Comment: Performed at [facility], [address] [place], [place] [address]    Blood Alcohol level:  Lab Results  Component Value Date   [facility] <10 11/22/2021   ETH <10 08/65/7846    Metabolic Disorder Labs: Lab Results  Component Value Date   HGBA1C 6.2  05/24/2021   No results found for: "PROLACTIN" Lab Results  Component Value Date   CHOL 190 05/24/2021   TRIG 65.0 05/24/2021   HDL 90.80 05/24/2021   CHOLHDL 2 05/24/2021   VLDL 13.0 05/24/2021   LDLCALC 86 05/24/2021   LDLCALC 82 01/09/2017    Physical Findings: AIMS:  , ,  ,  ,    CIWA:    COWS:     Musculoskeletal: Strength & Muscle Tone: within normal limits Gait & Station: unsteady Patient leans: N/A  Psychiatric Specialty Exam:  Presentation  General Appearance: Casual  Eye Contact:Fair  Speech:Clear and Coherent  Speech Volume:Normal  Handedness:No data recorded  Mood and Affect  Mood:Euthymic  Affect:Blunt   Thought Process  Thought Processes:Goal Directed  Descriptions of Associations:Tangential  Orientation:Full (Time, Place and Person)  Thought Content:Logical  History of Schizophrenia/Schizoaffective  disorder:No  Duration of Psychotic Symptoms:No data recorded Hallucinations:No data recorded Ideas of Reference:None  Suicidal Thoughts:No data recorded Homicidal Thoughts:No data recorded  Sensorium  Memory:Recent Good; Immediate Fair; Remote Fair  Judgment:Poor  Insight:Shallow   Executive Functions  Concentration:Fair  Attention Span:Fair  [place]   Psychomotor Activity  Psychomotor Activity:No data recorded  Assets  Assets:Social Support; Resilience; Physical Health; Financial Resources/Insurance   Sleep  Sleep:No data recorded   Physical Exam: Physical Exam Vitals and nursing note reviewed.  Constitutional:      Appearance: Normal appearance. She is normal weight.  Neurological:     General: No focal deficit present.     Mental Status: She is alert and oriented to person, place, and time.  Psychiatric:        Attention and Perception: Attention and perception normal.        Mood and Affect: Mood is depressed. Affect is flat.        Speech: Speech normal.        Behavior: Behavior normal. Behavior is cooperative.        Thought Content: Thought content normal.        Cognition and Memory: Cognition normal. Memory is impaired.        Judgment: Judgment is inappropriate.    Review of Systems  Constitutional: Negative.   HENT: Negative.    Eyes: Negative.   Respiratory: Negative.    Cardiovascular: Negative.   Gastrointestinal: Negative.   Genitourinary: Negative.   Musculoskeletal: Negative.   Skin: Negative.   Neurological: Negative.   Endo/Heme/Allergies: Negative.   Psychiatric/Behavioral:  Positive for depression.    Blood pressure (!) 144/68, pulse 61, temperature (!) 97.5 F (36.4 C), temperature source Oral, resp. rate 19, height 5' (1.524 m), weight 57 kg, SpO2 100 %. Body mass index is 24.54 kg/m.   Treatment Plan Summary: Daily contact with patient to assess and evaluate symptoms and  progress in treatment, Medication management, and Plan continue current medications.  Start Ritalin 2.5 mg in the afternoon.  Parks Ranger, DO 12/14/2021, 12:10 PM

## 2021-12-15 MED ORDER — ADULT MULTIVITAMIN W/MINERALS CH
1.0000 | ORAL_TABLET | Freq: Every day | ORAL | Status: DC
  Administered 2021-12-15 – 2021-12-17 (×3): 1 via ORAL
  Filled 2021-12-15 (×3): qty 1

## 2021-12-15 MED ORDER — METHYLPHENIDATE HCL 5 MG PO TABS
5.0000 mg | ORAL_TABLET | Freq: Every day | ORAL | Status: DC
  Administered 2021-12-15 – 2021-12-16 (×2): 5 mg via ORAL
  Filled 2021-12-15 (×2): qty 1

## 2021-12-15 MED ORDER — ENSURE ENLIVE PO LIQD
237.0000 mL | Freq: Three times a day (TID) | ORAL | Status: DC
  Administered 2021-12-15 – 2021-12-16 (×2): 237 mL via ORAL

## 2021-12-15 MED ORDER — MIRTAZAPINE 15 MG PO TABS
30.0000 mg | ORAL_TABLET | Freq: Every day | ORAL | Status: DC
  Administered 2021-12-15 – 2021-12-16 (×2): 30 mg via ORAL
  Filled 2021-12-15 (×2): qty 2

## 2021-12-15 NOTE — Progress Notes (Signed)
NUTRITION ASSESSMENT  Pt identified as at risk on the Malnutrition Screen Tool  INTERVENTION:  -Liberalize diet to regular for wider variety of meal selections -Ensure Enlive po TID, each supplement provides 350 kcal and 20 grams of protein -Magic cup TID with meals, each supplement provides 290 kcal and 9 grams of protein  -MVI with minerals daily   NUTRITION DIAGNOSIS: Inadequate oral intake related to poor appetite, abdominal pain as evidenced by pt/ family report.   Goal: Pt to meet >/= 90% of their estimated nutrition needs.  Monitor:  PO intake  Assessment:   Pt admitted with severe recurrent major depression without psychotic features and chronic pain.   Pt with very poor oral intake. Noted meal completions 25-50% on a heart healthy diet. Per chart review, pt complains of abdominal pain, but often refuses bowel regimen. Pt has been drowsy over the past 24-48 hours; home dose of ritalin reinstated. Per MD notes, hopeful that this will improve mental status.   Pt often refuses to attend group sessions.   Reviewed wt hx; pt has experienced a 3.7% wt loss over the past 9 months, which is not significant for time frame.   Medications reviewed and include colace, ritalin, and remeron.   Labs reviewed: Na: 131, CBGS: 104.    76 y.o. female  Height: Ht Readings from Last 1 Encounters:  12/07/21 5' (1.524 m)    Weight: Wt Readings from Last 1 Encounters:  12/07/21 57 kg    Weight Hx: Wt Readings from Last 10 Encounters:  12/07/21 57 kg  11/28/21 57 kg  08/23/21 59.4 kg  07/02/21 57.6 kg  06/28/21 57.8 kg  05/24/21 57.2 kg  05/17/21 58.2 kg  04/22/21 58.2 kg  04/19/21 58.7 kg  03/01/21 59.2 kg    BMI:  Body mass index is 24.54 kg/m. BMI WDL.   Estimated Nutritional Needs: Kcal: 25-30 kcal/kg Protein: > 1 gram protein/kg Fluid: 1 ml/kcal  Diet Order:  Diet Order             Diet Heart Room service appropriate? Yes; Fluid consistency: Thin  Diet  effective now                  Pt is also offered choice of unit snacks mid-morning and mid-afternoon.  Pt is eating as desired.   Lab results and medications reviewed.   Loistine Chance, RD, LDN, [place] Registered Dietitian II Certified Diabetes Care and Education Specialist Please refer to [facility] for RD and/or RD on-call/weekend/after hours pager

## 2021-12-15 NOTE — Progress Notes (Signed)
[facility] MD Progress Note  12/15/2021 10:44 AM Tina Bailey  MRN:  [id number] Subjective: Tina Bailey is seen on rounds.  She is more pleasant.  Nurses state that they think that she does look better.  I restarted her Risperdal and Remeron and she states that she is sleeping well but she is very drowsy in the morning.  I told her we will we will go up on her Remeron because at higher doses it is less drowsy.  Also worked better for depression.  I think that the Ritalin was helpful so order to go up on that too.  I told her she might be leaving on Friday.  I asked about her pain which is usually her chief complaint and she gestured so-so.  That is an improvement.  Principal Problem: Severe recurrent major depression without psychotic features ([place]) Diagnosis: Principal Problem:   Severe recurrent major depression without psychotic features ([place]) Active Problems:   Hepatocellular carcinoma ([place])   Chronic pain   Essential hypertension   Major depressive disorder, recurrent severe without psychotic features ([place])  Total Time spent with patient: 15 minutes  Past Psychiatric History:  Long history of depression.  History of suicide attempt.  More Recently: Tina Bailey is a 76 year old female with a history of COPD, hepatocellular carcinoma, opioid dependence with chronic abdominal and chronic back pain, depression, tobacco abuse, hypertension, chronic respiratory failure on 2 L presenting with suicide attempt.  The patient presented to the ED on 11/22/2021 after taking 28 Excedrin on the evening of 11/21/2021.  She began having nausea and vomiting throughout the night from 11/21/2021 to 11/22/2021.  She notified her daughter on the morning of 11/22/2021 and she was brought to the emergency department for further evaluation.  [facility]  The patient was evaluated by psychiatry who recommended inpatient evaluation and treatment.  While waiting for an inpatient Geri psychiatric bed, the  patient developed atrial fibrillation with RVR on the morning of 11/24/2021 with heart rate in the 170s.  She was given a diltiazem bolus, 5 mg and then started on a drip.  Admission was requested.  Past Medical History:  Past Medical History:  Diagnosis Date   Arthritis    DDD. Right shoulder"is frozen"-limited ROM. osteoporosis.   Blue toe syndrome ([place]) 11/27/2014   Dr. Claudia Pollock evaluating   Chronic diastolic CHF (congestive heart failure) (HCC)    Chronic respiratory failure (HCC)    Complication of anesthesia    COPD (chronic obstructive pulmonary disease) ([place])    Fibromyalgia    Fracture of rib of right side    hx "osteoporosis"- states her dog nudge her on the side, next day developed great pain and was told has a fracture rib.   Liver cancer ([place]) 12/2014   Macular degeneration    R eye   Osteoporosis    PONV (postoperative nausea and vomiting)    nausea,severe vomiting after 01-13-15 portal vein embolization   Productive cough    Retina disorder    L eye, vision distorted, edema   Right atrial mass    Spine fracture due to birth trauma    Thoracic aortic aneurysm (TAA) ([place])    TMJ disease    Wears glasses     Past Surgical History:  Procedure Laterality Date   ANGIOPLASTY  2008   no stents required, no follow-up with cardiologist, no recurrent chest pain   CATARACT EXTRACTION, BILATERAL Bilateral    ESOPHAGOGASTRODUODENOSCOPY (EGD) WITH PROPOFOL N/A 06/25/2015  Procedure: ESOPHAGOGASTRODUODENOSCOPY (EGD) WITH PROPOFOL;  Surgeon: Milus Banister, MD;  Location: [facility];  Service: Endoscopy;  Laterality: N/A;  stent removal    EYE SURGERY     cornea surgery   INTRAMEDULLARY (IM) NAIL INTERTROCHANTERIC Right 12/04/2017   Procedure: INTRAMEDULLARY (IM) NAIL INTERTROCHANTRIC;  Surgeon: Rod Can, MD;  Location: [facility]S;  Service: Orthopedics;  Laterality: Right;   IR RADIOLOGIST EVAL & MGMT  03/29/2017   KIDNEY STONE SURGERY     LAPAROSCOPIC PARTIAL  HEPATECTOMY N/A 03/04/2015   Procedure: DIAGNOSTIC LAPAROSCOPY, EXTENDED RIGHT HEPATECTOMY, WITH INTRAOPERATIVE ULTRASOUND;  Surgeon: Stark Klein, MD;  Location: [facility];  Service: General;  Laterality: N/A;   PARTIAL HYSTERECTOMY     portal vein embolization     01-13-15 -Dr. Cathlean Sauer.   Family History:  Family History  Problem Relation Age of Onset   Hypertension Mother    Atrial fibrillation Mother    Heart disease Mother        after age 76   Breast cancer Mother    Stroke Father    Dementia Father    Peptic Ulcer Father    Heart disease Brother        After age 28- A-Fib   Heart attack Brother     Social History:  Social History   Substance and Sexual Activity  Alcohol Use No   Alcohol/week: 0.0 standard drinks of alcohol     Social History   Substance and Sexual Activity  Drug Use No    Social History   Socioeconomic History   Marital status: Divorced    Spouse name: Not on file   Number of children: 3   Years of education: Not on file   Highest education level: Not on file  Occupational History   Occupation: retired  Tobacco Use   Smoking status: Former    Packs/day: 1.00    Years: 45.00    Total pack years: 45.00    Types: Cigarettes    Quit date: 02/20/2018    Years since quitting: 3.8   Smokeless tobacco: Never  Vaping Use   Vaping Use: Never used  Substance and Sexual Activity   Alcohol use: No    Alcohol/week: 0.0 standard drinks of alcohol   Drug use: No   Sexual activity: Not Currently  Other Topics Concern   Not on file  Social History Narrative   Not on file   Social Determinants of Health   Financial Resource Strain: Not on file  Food Insecurity: No Food Insecurity (12/07/2021)   Hunger Vital Sign    Worried About Running Out of Food in the Last Year: Never true    Ran Out of Food in the Last Year: Never true  Transportation Needs: No Transportation Needs (12/07/2021)   PRAPARE - Hydrologist  (Medical): No    Lack of Transportation (Non-Medical): No  Physical Activity: Not on file  Stress: Not on file  Social Connections: Not on file   Additional Social History:                         Sleep: Good  Appetite:  Fair  Current Medications: Current Facility-Administered Medications  Medication Dose Route Frequency Provider Last Rate Last Admin   acetaminophen (TYLENOL) tablet 650 mg  650 mg Oral Q6H PRN Patrecia Pour, NP   650 mg at 12/08/21 0947   alum & mag hydroxide-simeth (MAALOX/MYLANTA) 200-200-20 MG/5ML  suspension 30 mL  30 mL Oral Q4H PRN Patrecia Pour, NP   30 mL at 12/12/21 1224   clonazePAM (KLONOPIN) tablet 0.5 mg  0.5 mg Oral Daily PRN Patrecia Pour, NP   0.5 mg at 12/14/21 2030   docusate sodium (COLACE) capsule 100 mg  100 mg Oral BID Clapacs, John T, MD   100 mg at 12/13/21 2218   feeding supplement (ENSURE ENLIVE / ENSURE PLUS) liquid 237 mL  237 mL Oral TID BM Parks Ranger, DO       FLUoxetine (PROZAC) capsule 20 mg  20 mg Oral Daily Patrecia Pour, NP   20 mg at 12/15/21 0747   gabapentin (NEURONTIN) capsule 300 mg  300 mg Oral QHS Patrecia Pour, NP   300 mg at 12/14/21 2028   HYDROcodone-acetaminophen (NORCO) 10-325 MG per tablet 1 tablet  1 tablet Oral QID Patrecia Pour, NP   1 tablet at 12/15/21 0748   magnesium hydroxide (MILK OF MAGNESIA) suspension 30 mL  30 mL Oral Daily PRN Patrecia Pour, NP   30 mL at 12/08/21 1335   meloxicam (MOBIC) tablet 7.5 mg  7.5 mg Oral Daily PRN Donnamae Jude, MD   7.5 mg at 12/13/21 2008   methylphenidate (RITALIN) tablet 5 mg  5 mg Oral q1600 Parks Ranger, DO       metoprolol tartrate (LOPRESSOR) tablet 12.5 mg  12.5 mg Oral BID Patrecia Pour, NP   12.5 mg at 12/15/21 0746   mirtazapine (REMERON) tablet 30 mg  30 mg Oral QHS Parks Ranger, DO       multivitamin with minerals tablet 1 tablet  1 tablet Oral Daily Parks Ranger, DO       risperiDONE (RISPERDAL)  tablet 0.25 mg  0.25 mg Oral [id number] Parks Ranger, DO   0.25 mg at 12/15/21 7062    Lab Results: No results found for this or any previous visit (from the past 48 hour(s)).  Blood Alcohol level:  Lab Results  Component Value Date   ETH <10 11/22/2021   ETH <10 37/62/8315    Metabolic Disorder Labs: Lab Results  Component Value Date   HGBA1C 6.2 05/24/2021   No results found for: "PROLACTIN" Lab Results  Component Value Date   CHOL 190 05/24/2021   TRIG 65.0 05/24/2021   HDL 90.80 05/24/2021   CHOLHDL 2 05/24/2021   VLDL 13.0 05/24/2021   LDLCALC 86 05/24/2021   LDLCALC 82 01/09/2017    Physical Findings: AIMS:  , ,  ,  ,    CIWA:    COWS:     Musculoskeletal: Strength & Muscle Tone: within normal limits Gait & Station: unsteady Patient leans: N/A  Psychiatric Specialty Exam:  Presentation  General Appearance: Casual  Eye Contact:Fair  Speech:Clear and Coherent  Speech Volume:Normal  Handedness:No data recorded  Mood and Affect  Mood:Euthymic  Affect:Blunt   Thought Process  Thought Processes:Goal Directed  Descriptions of Associations:Tangential  Orientation:Full (Time, Place and Person)  Thought Content:Logical  History of Schizophrenia/Schizoaffective disorder:No  Duration of Psychotic Symptoms:No data recorded Hallucinations:No data recorded Ideas of Reference:None  Suicidal Thoughts:No data recorded Homicidal Thoughts:No data recorded  Sensorium  Memory:Recent Good; Immediate Fair; Remote Fair  Judgment:Poor  Insight:Shallow   Executive Functions  Concentration:Fair  Attention Span:Fair  [place]   Psychomotor Activity  Psychomotor Activity:No data recorded  Assets  Assets:Social Support; Resilience; Physical Health; Financial Resources/Insurance  Sleep  Sleep:No data recorded   Physical Exam: Physical Exam Vitals and nursing note reviewed.   Constitutional:      Appearance: Normal appearance. She is normal weight.  Neurological:     General: No focal deficit present.     Mental Status: She is alert and oriented to person, place, and time.  Psychiatric:        Attention and Perception: Attention and perception normal.        Mood and Affect: Mood is depressed. Affect is flat.        Speech: Speech normal.        Behavior: Behavior normal. Behavior is cooperative.        Thought Content: Thought content normal.        Cognition and Memory: Cognition and memory normal.        Judgment: Judgment normal.    Review of Systems  Constitutional: Negative.   HENT: Negative.    Eyes: Negative.   Respiratory: Negative.    Cardiovascular: Negative.   Gastrointestinal: Negative.   Genitourinary: Negative.   Musculoskeletal: Negative.   Skin: Negative.   Neurological: Negative.   Endo/Heme/Allergies: Negative.   Psychiatric/Behavioral:  Positive for depression.    Blood pressure (!) 140/77, pulse 89, temperature 97.8 F (36.6 C), temperature source Oral, resp. rate 18, height 5' (1.524 m), weight 57 kg, SpO2 100 %. Body mass index is 24.54 kg/m.   Treatment Plan Summary: Daily contact with patient to assess and evaluate symptoms and progress in treatment, Medication management, and Plan increase Remeron to 30 mg at bedtime.  Increase Ritalin to 5 mg in the afternoon.  Parks Ranger, DO 12/15/2021, 10:44 AM

## 2021-12-15 NOTE — BHH Counselor (Addendum)
ADDENDUM CSW received follow up call from patient's new home in [place].  She requests that at discharge information on patient's medications, allergies, diet and diagnosis.  Needs to be faxed to [phone], attention Danna. Assunta Curtis, MSW, LCSW 12/15/2021 2:11 PM    CSW contacted the patient's reported new facility, [facility],  [place], [place], [phone].  CSW was informed that admission staff is not yet in the office.  CSW left message requesting a return phone call.  Assunta Curtis, MSW, LCSW 12/15/2021 9:25 AM

## 2021-12-15 NOTE — Progress Notes (Signed)
Pt has been pleasant, cooperative with staff. She reports '' I'm just tired and old and I worry about my daughter having to take care of things. '' Patient state she feels her mood is '' a little better '' and denies any SI /HI or A/V Hallucinations. She has been up and about in the dayroom and interacting more with peers. Pt con't to report chronic pain but reports improvement with scheduled medications. Patient remains on the unit, safe, with continuous 02 2L [place]. 1.1. at bedside. Pt is safe, able to make her needs known. Eating and drinking well and in no acute distress. Will con't to monitor.

## 2021-12-15 NOTE — Progress Notes (Signed)
Pt is Aox4. Denies SI/HI/AVH. Vital signs are stable. Denies pain. Compliant to medications. Calm and cooperative. Withdrawn to her room. appetite is good Pt ate snacks. Communicates well with peers and staff very well. Able to do ADL with minimal assist. Q 15 minutes check is maintained. Reported back pain and pain med administered as prescribed. Went to bed after took night medications.  No other issues.. We will continue to monitor.

## 2021-12-15 NOTE — Plan of Care (Signed)

## 2021-12-15 NOTE — Group Note (Signed)
[facility] LCSW Group Therapy Note   Group Date: 12/15/2021 Start Time: 1300 End Time: 1400   Type of Therapy/Topic:  Group Therapy:  Emotion Regulation  Participation Level:  None   Mood:  Description of Group:    The purpose of this group is to assist patients in learning to regulate negative emotions and experience positive emotions. Patients will be guided to discuss ways in which they have been vulnerable to their negative emotions. These vulnerabilities will be juxtaposed with experiences of positive emotions or situations, and patients challenged to use positive emotions to combat negative ones. Special emphasis will be placed on coping with negative emotions in conflict situations, and patients will process healthy conflict resolution skills.  Therapeutic Goals: Patient will identify two positive emotions or experiences to reflect on in order to balance out negative emotions:  Patient will label two or more emotions that they find the most difficult to experience:  Patient will be able to demonstrate positive conflict resolution skills through discussion or role plays:   Summary of Patient Progress: Patient attended group, however, did not engage in group discussion.    Therapeutic Modalities:   Cognitive Behavioral Therapy Feelings Identification Dialectical Behavioral Therapy   Rozann Lesches, LCSW

## 2021-12-15 NOTE — BHH Counselor (Signed)
CSW spoke with [name], daughter, [phone].  CSW updated that discharge is scheduled for Friday.  Daughter reports no concerns but reports that she would like to come to visitation on Thursday to discuss medications to preemptively identify any barriers to a successful discharge on Friday, such as medication issues.  She reports that the family has the patient's oxygen needs taken care of.  She reports that she would like for medications to be called into the [facility] on [address].  Assunta Curtis, MSW, LCSW 12/15/2021 9:17 AM

## 2021-12-16 NOTE — Progress Notes (Signed)
Patient presents with depressed mood, affect congruent. Tina Bailey reports feeling '' worse today. Every bone in my body aches and I am tired. '' Patient slower to arise this am stating '' I am just tired. '' Patient declined to take am stool softener stating she had many BM's Monday and needed to wait another day. Patient remains on chronic constant 02 2L [place] and requires constant 1.1. to assist due to multiple care equipment and high fall risk as well as SI for safety.  Patient denies any SI this am . No signs pt responding to internal stimuli.

## 2021-12-16 NOTE — Progress Notes (Signed)
[facility] MD Progress Note  12/16/2021 12:18 PM Tina Bailey  MRN:  [id number] Subjective: Tina Bailey is seen on rounds.  She has been compliant with her medications and was much improved yesterday.  Today, she complains of a lot of pain but she is on a sufficient amount of pain medications.  I believe when she moves around is when she starts complaining of pain.  When she is still in the chair she seems to be feeling okay.  She denies any suicidal ideation.  Principal Problem: Severe recurrent major depression without psychotic features ([place]) Diagnosis: Principal Problem:   Severe recurrent major depression without psychotic features ([place]) Active Problems:   Hepatocellular carcinoma ([place])   Chronic pain   Essential hypertension   Major depressive disorder, recurrent severe without psychotic features ([place])  Total Time spent with patient: 15 minutes  Past Psychiatric History:  Long history of depression.  History of suicide attempt.  Past Medical History:  Past Medical History:  Diagnosis Date   Arthritis    DDD. Right shoulder"is frozen"-limited ROM. osteoporosis.   Blue toe syndrome ([place]) 11/27/2014   Dr. Claudia Pollock evaluating   Chronic diastolic CHF (congestive heart failure) (HCC)    Chronic respiratory failure (HCC)    Complication of anesthesia    COPD (chronic obstructive pulmonary disease) ([place])    Fibromyalgia    Fracture of rib of right side    hx "osteoporosis"- states her dog nudge her on the side, next day developed great pain and was told has a fracture rib.   Liver cancer ([place]) 12/2014   Macular degeneration    R eye   Osteoporosis    PONV (postoperative nausea and vomiting)    nausea,severe vomiting after 01-13-15 portal vein embolization   Productive cough    Retina disorder    L eye, vision distorted, edema   Right atrial mass    Spine fracture due to birth trauma    Thoracic aortic aneurysm (TAA) ([place])    TMJ disease    Wears glasses     Past Surgical  History:  Procedure Laterality Date   ANGIOPLASTY  2008   no stents required, no follow-up with cardiologist, no recurrent chest pain   CATARACT EXTRACTION, BILATERAL Bilateral    ESOPHAGOGASTRODUODENOSCOPY (EGD) WITH PROPOFOL N/A 06/25/2015   Procedure: ESOPHAGOGASTRODUODENOSCOPY (EGD) WITH PROPOFOL;  Surgeon: Milus Banister, MD;  Location: [facility];  Service: Endoscopy;  Laterality: N/A;  stent removal    EYE SURGERY     cornea surgery   INTRAMEDULLARY (IM) NAIL INTERTROCHANTERIC Right 12/04/2017   Procedure: INTRAMEDULLARY (IM) NAIL INTERTROCHANTRIC;  Surgeon: Rod Can, MD;  Location: [facility];  Service: Orthopedics;  Laterality: Right;   IR RADIOLOGIST EVAL & MGMT  03/29/2017   KIDNEY STONE SURGERY     LAPAROSCOPIC PARTIAL HEPATECTOMY N/A 03/04/2015   Procedure: DIAGNOSTIC LAPAROSCOPY, EXTENDED RIGHT HEPATECTOMY, WITH INTRAOPERATIVE ULTRASOUND;  Surgeon: Stark Klein, MD;  Location: [facility];  Service: General;  Laterality: N/A;   PARTIAL HYSTERECTOMY     portal vein embolization     01-13-15 -Dr. Cathlean Sauer.   Family History:  Family History  Problem Relation Age of Onset   Hypertension Mother    Atrial fibrillation Mother    Heart disease Mother        after age 25   Breast cancer Mother    Stroke Father    Dementia Father    Peptic Ulcer Father    Heart disease Brother  After age 63- A-Fib   Heart attack Brother     Social History:  Social History   Substance and Sexual Activity  Alcohol Use No   Alcohol/week: 0.0 standard drinks of alcohol     Social History   Substance and Sexual Activity  Drug Use No    Social History   Socioeconomic History   Marital status: Divorced    Spouse name: Not on file   Number of children: 3   Years of education: Not on file   Highest education level: Not on file  Occupational History   Occupation: retired  Tobacco Use   Smoking status: Former    Packs/day: 1.00    Years: 45.00    Total pack years:  45.00    Types: Cigarettes    Quit date: 02/20/2018    Years since quitting: 3.8   Smokeless tobacco: Never  Vaping Use   Vaping Use: Never used  Substance and Sexual Activity   Alcohol use: No    Alcohol/week: 0.0 standard drinks of alcohol   Drug use: No   Sexual activity: Not Currently  Other Topics Concern   Not on file  Social History Narrative   Not on file   Social Determinants of Health   Financial Resource Strain: Not on file  Food Insecurity: No Food Insecurity (12/07/2021)   Hunger Vital Sign    Worried About Running Out of Food in the Last Year: Never true    Ran Out of Food in the Last Year: Never true  Transportation Needs: No Transportation Needs (12/07/2021)   PRAPARE - Hydrologist (Medical): No    Lack of Transportation (Non-Medical): No  Physical Activity: Not on file  Stress: Not on file  Social Connections: Not on file   Additional Social History:                         Sleep: Good  Appetite:  Fair  Current Medications: Current Facility-Administered Medications  Medication Dose Route Frequency Provider Last Rate Last Admin   acetaminophen (TYLENOL) tablet 650 mg  650 mg Oral Q6H PRN Patrecia Pour, NP   650 mg at 12/08/21 0947   alum & mag hydroxide-simeth (MAALOX/MYLANTA) 200-200-20 MG/5ML suspension 30 mL  30 mL Oral Q4H PRN Patrecia Pour, NP   30 mL at 12/12/21 1224   clonazePAM (KLONOPIN) tablet 0.5 mg  0.5 mg Oral Daily PRN Patrecia Pour, NP   0.5 mg at 12/14/21 2030   docusate sodium (COLACE) capsule 100 mg  100 mg Oral BID Clapacs, John T, MD   100 mg at 12/13/21 2218   feeding supplement (ENSURE ENLIVE / ENSURE PLUS) liquid 237 mL  237 mL Oral TID BM Parks Ranger, DO   237 mL at 12/15/21 2149   FLUoxetine (PROZAC) capsule 20 mg  20 mg Oral Daily Patrecia Pour, NP   20 mg at 12/16/21 0920   gabapentin (NEURONTIN) capsule 300 mg  300 mg Oral QHS Patrecia Pour, NP   300 mg at 12/15/21  2148   HYDROcodone-acetaminophen (NORCO) 10-325 MG per tablet 1 tablet  1 tablet Oral QID Patrecia Pour, NP   1 tablet at 12/16/21 1142   magnesium hydroxide (MILK OF MAGNESIA) suspension 30 mL  30 mL Oral Daily PRN Patrecia Pour, NP   30 mL at 12/08/21 1335   meloxicam (MOBIC) tablet 7.5 mg  7.5 mg Oral  Daily PRN Donnamae Jude, MD   7.5 mg at 12/13/21 2008   methylphenidate (RITALIN) tablet 5 mg  5 mg Oral q1600 Parks Ranger, DO   5 mg at 12/15/21 1619   metoprolol tartrate (LOPRESSOR) tablet 12.5 mg  12.5 mg Oral BID Patrecia Pour, NP   12.5 mg at 12/16/21 0919   mirtazapine (REMERON) tablet 30 mg  30 mg Oral QHS Parks Ranger, DO   30 mg at 12/15/21 2147   multivitamin with minerals tablet 1 tablet  1 tablet Oral Daily Parks Ranger, DO   1 tablet at 12/16/21 0920   risperiDONE (RISPERDAL) tablet 0.25 mg  0.25 mg Oral [id number] Parks Ranger, DO   0.25 mg at 12/16/21 0354    Lab Results: No results found for this or any previous visit (from the past 48 hour(s)).  Blood Alcohol level:  Lab Results  Component Value Date   ETH <10 11/22/2021   ETH <10 65/68/1275    Metabolic Disorder Labs: Lab Results  Component Value Date   HGBA1C 6.2 05/24/2021   No results found for: "PROLACTIN" Lab Results  Component Value Date   CHOL 190 05/24/2021   TRIG 65.0 05/24/2021   HDL 90.80 05/24/2021   CHOLHDL 2 05/24/2021   VLDL 13.0 05/24/2021   LDLCALC 86 05/24/2021   LDLCALC 82 01/09/2017    Physical Findings: AIMS:  , ,  ,  ,    CIWA:    COWS:     Musculoskeletal: Strength & Muscle Tone: within normal limits Gait & Station: unable to stand Patient leans: N/A  Psychiatric Specialty Exam:  Presentation  General Appearance: Casual  Eye Contact:Fair  Speech:Clear and Coherent  Speech Volume:Normal  Handedness:No data recorded  Mood and Affect  Mood:Euthymic  Affect:Blunt   Thought Process  Thought Processes:Goal  Directed  Descriptions of Associations:Tangential  Orientation:Full (Time, Place and Person)  Thought Content:Logical  History of Schizophrenia/Schizoaffective disorder:No  Duration of Psychotic Symptoms:No data recorded Hallucinations:No data recorded Ideas of Reference:None  Suicidal Thoughts:No data recorded Homicidal Thoughts:No data recorded  Sensorium  Memory:Recent Good; Immediate Fair; Remote Fair  Judgment:Poor  Insight:Shallow   Executive Functions  Concentration:Fair  Attention Span:Fair  [place]   Psychomotor Activity  Psychomotor Activity:No data recorded  Assets  Assets:Social Support; Resilience; Physical Health; Financial Resources/Insurance   Sleep  Sleep:No data recorded   Physical Exam: Physical Exam Vitals and nursing note reviewed.  Constitutional:      Appearance: Normal appearance. She is normal weight.  Neurological:     General: No focal deficit present.     Mental Status: She is alert and oriented to person, place, and time.  Psychiatric:        Attention and Perception: Attention and perception normal.        Mood and Affect: Mood is depressed. Affect is flat.        Speech: Speech normal.        Behavior: Behavior normal. Behavior is cooperative.        Thought Content: Thought content normal.        Cognition and Memory: Cognition and memory normal.        Judgment: Judgment normal.    Review of Systems  Constitutional: Negative.   HENT: Negative.    Eyes: Negative.   Respiratory: Negative.    Cardiovascular: Negative.   Gastrointestinal: Negative.   Genitourinary: Negative.   Musculoskeletal: Negative.   Skin: Negative.  Neurological: Negative.   Endo/Heme/Allergies: Negative.   Psychiatric/Behavioral:  Positive for depression.    Blood pressure (!) 143/76, pulse 96, temperature 98.6 F (37 C), temperature source Oral, resp. rate 18, height 5' (1.524 m), weight 57  kg, SpO2 95 %. Body mass index is 24.54 kg/m.   Treatment Plan Summary: Daily contact with patient to assess and evaluate symptoms and progress in treatment, Medication management, and Plan continue current medications.  Parks Ranger, DO 12/16/2021, 12:18 PM

## 2021-12-16 NOTE — Group Note (Signed)
[facility] LCSW Group Therapy Note   Group Date: 12/16/2021 Start Time: 1310 End Time: 1400   Type of Therapy/Topic:  Group Therapy:  Balance in Life  Participation Level:  None   Description of Group:    This group will address the concept of balance and how it feels and looks when one is unbalanced. Patients will be encouraged to process areas in their lives that are out of balance, and identify reasons for remaining unbalanced. Facilitators will guide patients utilizing problem- solving interventions to address and correct the stressor making their life unbalanced. Understanding and applying boundaries will be explored and addressed for obtaining  and maintaining a balanced life. Patients will be encouraged to explore ways to assertively make their unbalanced needs known to significant others in their lives, using other group members and facilitator for support and feedback.  Therapeutic Goals: Patient will identify two or more emotions or situations they have that consume much of in their lives. Patient will identify signs/triggers that life has become out of balance:  Patient will identify two ways to set boundaries in order to achieve balance in their lives:  Patient will demonstrate ability to communicate their needs through discussion and/or role plays  Summary of Patient Progress: Patient was present in group, however did not participate in group discussions.    Therapeutic Modalities:   Cognitive Behavioral Therapy Solution-Focused Therapy Assertiveness Training   Rozann Lesches, LCSW

## 2021-12-17 MED ORDER — METHYLPHENIDATE HCL 5 MG PO TABS: 5.0000 mg | ORAL_TABLET | Freq: Every day | ORAL | 0 refills | Status: DC

## 2021-12-17 MED ORDER — GABAPENTIN 300 MG PO CAPS: 300.0000 mg | ORAL_CAPSULE | Freq: Every day | ORAL | 3 refills | Status: DC

## 2021-12-17 MED ORDER — CLONAZEPAM 0.5 MG PO TABS: 0.5000 mg | ORAL_TABLET | Freq: Every day | ORAL | 0 refills | Status: DC | PRN

## 2021-12-17 MED ORDER — METOPROLOL TARTRATE 25 MG PO TABS: 12.5000 mg | ORAL_TABLET | Freq: Two times a day (BID) | ORAL | 3 refills | Status: DC

## 2021-12-17 MED ORDER — MIRTAZAPINE 30 MG PO TABS: 30.0000 mg | ORAL_TABLET | Freq: Every day | ORAL | 3 refills | Status: DC

## 2021-12-17 MED ORDER — HYDROCODONE-ACETAMINOPHEN 10-325 MG PO TABS: 1.0000 | ORAL_TABLET | Freq: Four times a day (QID) | ORAL | 0 refills | Status: DC

## 2021-12-17 MED ORDER — MELOXICAM 7.5 MG PO TABS: 7.5000 mg | ORAL_TABLET | Freq: Two times a day (BID) | ORAL | 0 refills | Status: DC | PRN

## 2021-12-17 MED ORDER — RISPERIDONE 0.25 MG PO TABS: 0.2500 mg | ORAL_TABLET | ORAL | 3 refills | Status: DC

## 2021-12-17 MED ORDER — ADULT MULTIVITAMIN W/MINERALS CH: 1.0000 | ORAL_TABLET | Freq: Every day | ORAL | 3 refills | Status: DC

## 2021-12-17 MED ORDER — FLUOXETINE HCL 20 MG PO CAPS: 20.0000 mg | ORAL_CAPSULE | Freq: Every day | ORAL | 3 refills | Status: DC

## 2021-12-17 NOTE — Progress Notes (Signed)
  [facility] Adult Case Management Discharge Plan :  Will you be returning to the same living situation after discharge:  Yes,  pt returning to the home of her daughter before family transports to new ALF in [place]. At discharge, do you have transportation home?: Yes,  pts daughter Do you have the ability to pay for your medications: Yes,    [employer] MEDICARE / AETNA MEDICARE HMO/PPO  Release of information consent forms completed and in the chart;  Patient's signature needed at discharge.  Patient to Follow up at:  [facility].   Why: Once admitted, staff will make a referral for their in house provider to complete psychiatric services. Contact information: [address],  [place] [address] [phone]                Next level of care provider has access to [facility] and Suicide Prevention discussed: Yes,  SPE completed with the patients daughter.     Has patient been referred to the Quitline?: N/A patient is not a smoker  Patient has been referred for addiction treatment: [place], LCSW 12/17/2021, 12:31 PM

## 2021-12-17 NOTE — Care Management Important Message (Signed)
Important Message  Patient Details  Name: Tina Bailey MRN: [id number] Date of Birth: 1945-11-19   Medicare Important Message Given:  Yes     Rozann Lesches, LCSW 12/17/2021, 12:32 PM

## 2021-12-17 NOTE — Discharge Summary (Signed)
Physician Discharge Summary Note  Patient:  Tina Bailey is an 76 y.o., female MRN:  [id number] DOB:  1946/01/13 Patient phone:  [phone] (home)  Patient address:   [place] [address],  Total Time spent with patient: 1 hour  Date of Admission:  12/07/2021 Date of Discharge: 12/17/2021  Reason for Admission:  77 yo female admitted after an overdose on Excedrin; was medically cleared at [facility] from admit in early September; presents here today for psychiatric evaluation; denies suicidal/homicidal thoughts upon assessment; denies aud/visual hallucinations; denies depression/anxiety at this time; her primary concern right now is her pain management, stating that she has "fractures" and takes Clonazepam 1.'5mg'$  and Norco 10/325 daily, confirmed via PDMP;    Psychiatric history includes per pt one suicide attempt last year, pt unable to recall the medication; when asked why she wanted to end her life, she states " its just time to call it quits due to the pain"; has support from her daughter [name] who lives locally and her son Ovid Curd who lives out of state. She denies using marijuana in any form or other ellicit substances; states that she has "one white russian once a year, if that"; she lives at "[facility]".  Principal Problem: Severe recurrent major depression without psychotic features [facility]) Discharge Diagnoses: Principal Problem:   Severe recurrent major depression without psychotic features ([place]) Active Problems:   Hepatocellular carcinoma (HCC)   Chronic pain   Essential hypertension   Major depressive disorder, recurrent severe without psychotic features ([place])   Past Psychiatric History: More Recently: Tina Bailey is a 77 year old female with a history of COPD, hepatocellular carcinoma, opioid dependence with chronic abdominal and chronic back pain, depression, tobacco abuse, hypertension, chronic respiratory failure on 2 L presenting with  suicide attempt.  The patient presented to the ED on 11/22/2021 after taking 28 Excedrin on the evening of 11/21/2021.  She began having nausea and vomiting throughout the night from 11/21/2021 to 11/22/2021.  She notified her daughter on the morning of 11/22/2021 and she was brought to the emergency department for further evaluation.  [facility].  The patient was evaluated by psychiatry who recommended inpatient evaluation and treatment.  While waiting for an inpatient Geri psychiatric bed, the patient developed atrial fibrillation with RVR on the morning of 11/24/2021 with heart rate in the 170s.  She was given a diltiazem bolus, 5 mg and then started on a drip.  Admission was requested.  Past Medical History:  Past Medical History:  Diagnosis Date   Arthritis    DDD. Right shoulder"is frozen"-limited ROM. osteoporosis.   Blue toe syndrome ([place]) 11/27/2014   Dr. Claudia Pollock evaluating   Chronic diastolic CHF (congestive heart failure) (HCC)    Chronic respiratory failure (HCC)    Complication of anesthesia    COPD (chronic obstructive pulmonary disease) ([place])    Fibromyalgia    Fracture of rib of right side    hx "osteoporosis"- states her dog nudge her on the side, next day developed great pain and was told has a fracture rib.   Liver cancer ([place]) 12/2014   Macular degeneration    R eye   Osteoporosis    PONV (postoperative nausea and vomiting)    nausea,severe vomiting after 01-13-15 portal vein embolization   Productive cough    Retina disorder    L eye, vision distorted, edema   Right atrial mass    Spine fracture due to birth trauma  Thoracic aortic aneurysm (TAA) ([place])    TMJ disease    Wears glasses     Past Surgical History:  Procedure Laterality Date   ANGIOPLASTY  2008   no stents required, no follow-up with cardiologist, no recurrent chest pain   CATARACT EXTRACTION, BILATERAL Bilateral    ESOPHAGOGASTRODUODENOSCOPY (EGD) WITH PROPOFOL N/A 06/25/2015    Procedure: ESOPHAGOGASTRODUODENOSCOPY (EGD) WITH PROPOFOL;  Surgeon: Milus Banister, MD;  Location: [facility];  Service: Endoscopy;  Laterality: N/A;  stent removal    EYE SURGERY     cornea surgery   INTRAMEDULLARY (IM) NAIL INTERTROCHANTERIC Right 12/04/2017   Procedure: INTRAMEDULLARY (IM) NAIL INTERTROCHANTRIC;  Surgeon: Rod Can, MD;  Location: [facility] ORS;  Service: Orthopedics;  Laterality: Right;   IR RADIOLOGIST EVAL & MGMT  03/29/2017   KIDNEY STONE SURGERY     LAPAROSCOPIC PARTIAL HEPATECTOMY N/A 03/04/2015   Procedure: DIAGNOSTIC LAPAROSCOPY, EXTENDED RIGHT HEPATECTOMY, WITH INTRAOPERATIVE ULTRASOUND;  Surgeon: Stark Klein, MD;  Location: [facility];  Service: General;  Laterality: N/A;   PARTIAL HYSTERECTOMY     portal vein embolization     01-13-15 -Dr. Cathlean Sauer.   Family History:  Family History  Problem Relation Age of Onset   Hypertension Mother    Atrial fibrillation Mother    Heart disease Mother        after age 77   Breast cancer Mother    Stroke Father    Dementia Father    Peptic Ulcer Father    Heart disease Brother        After age 13- A-Fib   Heart attack Brother    Family Psychiatric  History: Unremarkable  Social History:  Social History   Substance and Sexual Activity  Alcohol Use No   Alcohol/week: 0.0 standard drinks of alcohol     Social History   Substance and Sexual Activity  Drug Use No    Social History   Socioeconomic History   Marital status: Divorced    Spouse name: Not on file   Number of children: 3   Years of education: Not on file   Highest education level: Not on file  Occupational History   Occupation: retired  Tobacco Use   Smoking status: Former    Packs/day: 1.00    Years: 45.00    Total pack years: 45.00    Types: Cigarettes    Quit date: 02/20/2018    Years since quitting: 3.8   Smokeless tobacco: Never  Vaping Use   Vaping Use: Never used  Substance and Sexual Activity   Alcohol use: No     Alcohol/week: 0.0 standard drinks of alcohol   Drug use: No   Sexual activity: Not Currently  Other Topics Concern   Not on file  Social History Narrative   Not on file   Social Determinants of Health   Financial Resource Strain: Not on file  Food Insecurity: No Food Insecurity (12/07/2021)   Hunger Vital Sign    Worried About Running Out of Food in the Last Year: Never true    Ran Out of Food in the Last Year: Never true  Transportation Needs: No Transportation Needs (12/07/2021)   PRAPARE - Hydrologist (Medical): No    Lack of Transportation (Non-Medical): No  Physical Activity: Not on file  Stress: Not on file  Social Connections: Not on file    Hospital Course: Hydee is a 76 year old white female who was admitted to geriatric psychiatry after  taking an overdose of Excedrin.  She has multiple medical problems which contributed to her depression.  Pain is her main problem.  While on the inpatient unit she was restarted on her Risperdal and Remeron.  Remeron was titrated up to 30 mg at bedtime because 15 mg was making her too drowsy and in the daytime.  Ritalin was added in the afternoon due to her daytime drowsiness.  This did help.  Prozac was also restarted at 20 mg/day.  Her pain was well controlled on Norco 10/325 4 times a day.  She was also using Mobic as needed and Klonopin sparingly for anxiety.  She was pleasant and cooperative throughout her hospitalization.  She participated in groups.  She interacted well with peers and staff.  Overall her mood and affect improved.  It was felt that she maximized hospitalization she was discharged home to her daughter where they are planning to take her to [place] for assisted living closer to her son.  Physical Findings: AIMS:  , ,  ,  ,    CIWA:    COWS:     Musculoskeletal: Strength & Muscle Tone: within normal limits Gait & Station: unsteady Patient leans: N/A   Psychiatric Specialty  Exam:  Presentation  General Appearance:  Casual  Eye Contact: Fair  Speech: Clear and Coherent  Speech Volume: Normal  Handedness:No data recorded  Mood and Affect  Mood: Euthymic  Affect: Blunt   Thought Process  Thought Processes: Goal Directed  Descriptions of Associations:Tangential  Orientation:Full (Time, Place and Person)  Thought Content:Logical  History of Schizophrenia/Schizoaffective disorder:No  Duration of Psychotic Symptoms:No data recorded Hallucinations:No data recorded Ideas of Reference:None  Suicidal Thoughts:No data recorded Homicidal Thoughts:No data recorded  Sensorium  Memory: Recent Good; Immediate Fair; Remote Fair  Judgment: Poor  Insight: Shallow   Executive Functions  Concentration: Fair  Attention Span: Fair  Recall: [place] of Knowledge: Fair  Language: Fair   Psychomotor Activity  Psychomotor Activity:No data recorded  Assets  Assets: Social Support; Resilience; Physical Health; Financial Resources/Insurance   Sleep  Sleep:No data recorded   Physical Exam: Physical Exam Vitals and nursing note reviewed.  Constitutional:      Appearance: Normal appearance. She is normal weight.  Neurological:     General: No focal deficit present.     Mental Status: She is alert and oriented to person, place, and time.  Psychiatric:        Attention and Perception: Attention and perception normal.        Mood and Affect: Mood and affect normal.        Speech: Speech normal.        Behavior: Behavior normal. Behavior is cooperative.        Thought Content: Thought content normal.        Cognition and Memory: Cognition and memory normal.        Judgment: Judgment normal.    Review of Systems  Constitutional: Negative.   HENT: Negative.    Eyes: Negative.   Respiratory: Negative.    Cardiovascular: Negative.   Gastrointestinal: Negative.   Genitourinary: Negative.   Musculoskeletal: Negative.    Skin: Negative.   Neurological: Negative.   Endo/Heme/Allergies: Negative.   Psychiatric/Behavioral: Negative.     Blood pressure 112/72, pulse (!) 109, temperature (!) 96.3 F (35.7 C), resp. rate 18, height 5' (1.524 m), weight 57 kg, SpO2 99 %. Body mass index is 24.54 kg/m.   Social History   Tobacco Use  Smoking Status Former   Packs/day: 1.00   Years: 45.00   Total pack years: 45.00   Types: Cigarettes   Quit date: 02/20/2018   Years since quitting: 3.8  Smokeless Tobacco Never   Tobacco Cessation:  N/A, patient does not currently use tobacco products   Blood Alcohol level:  Lab Results  Component Value Date   ETH <10 11/22/2021   ETH <10 81/44/8185    Metabolic Disorder Labs:  Lab Results  Component Value Date   HGBA1C 6.2 05/24/2021   No results found for: "PROLACTIN" Lab Results  Component Value Date   CHOL 190 05/24/2021   TRIG 65.0 05/24/2021   HDL 90.80 05/24/2021   CHOLHDL 2 05/24/2021   VLDL 13.0 05/24/2021   LDLCALC 86 05/24/2021   [place] 82 01/09/2017    See Psychiatric Specialty Exam and Suicide Risk Assessment completed by Attending Physician prior to discharge.  Discharge destination:  Home  Is patient on multiple antipsychotic therapies at discharge:  No   Has Patient had three or more failed trials of antipsychotic monotherapy by history:  No  Recommended Plan for Multiple Antipsychotic Therapies: NA   Allergies as of 12/17/2021       Reactions   Zoloft [sertraline Hcl] Other (See Comments)   Zoloft, had a terrible reaction   Amoxicillin-pot Clavulanate Nausea Only   Amoxacillin Augmentin combo  Has patient had a PCN reaction causing immediate rash, facial/tongue/throat swelling, SOB or lightheadedness with hypotension: No Has patient had a PCN reaction causing severe rash involving mucus membranes or skin necrosis: No Has patient had a PCN reaction that required hospitalization No Has patient had a PCN reaction occurring  within the last 10 years: No If all of the above answers are "NO", then may proceed with Cephalosporin use.   Clindamycin/lincomycin Other (See Comments)   Felt like it "burned out" her stomach/took a large dose   Paxil [paroxetine Hcl] Rash   Sulfa Antibiotics Rash        Medication List     STOP taking these medications    polyethylene glycol 17 g packet Commonly known as: MIRALAX / GLYCOLAX   senna 8.6 MG Tabs tablet Commonly known as: SENOKOT       TAKE these medications      Indication  Breztri Aerosphere 160-9-4.8 MCG/ACT Aero Generic drug: Budeson-Glycopyrrol-Formoterol Take 2 puffs first thing in am and then another 2 puffs about 12 hours later.    clonazePAM 0.5 MG tablet Commonly known as: KLONOPIN Take 1 tablet (0.5 mg total) by mouth daily as needed (Anxiety). What changed: when to take this  Indication: Feeling Anxious   FLUoxetine 20 MG capsule Commonly known as: PROZAC Take 1 capsule (20 mg total) by mouth daily. What changed: how much to take  Indication: Depression, Major Depressive Disorder   gabapentin 300 MG capsule Commonly known as: NEURONTIN Take 1 capsule (300 mg total) by mouth at bedtime.  Indication: Neuropathic Pain   HYDROcodone-acetaminophen 10-325 MG tablet Commonly known as: NORCO Take 1 tablet by mouth 4 (four) times daily. What changed: when to take this  Indication: Pain   meloxicam 7.5 MG tablet Commonly known as: MOBIC Take 1 tablet (7.5 mg total) by mouth every 12 (twelve) hours as needed (right rib pain).  Indication: Joint Damage causing Pain and Loss of Function   methylphenidate 5 MG tablet Commonly known as: RITALIN Take 1 tablet (5 mg total) by mouth daily at 4 PM.  Indication: Tiredness, Major Depressive Disorder  metoprolol tartrate 25 MG tablet Commonly known as: LOPRESSOR Take 0.5 tablets (12.5 mg total) by mouth 2 (two) times daily.  Indication: Atrial Fibrillation, High Blood Pressure Disorder    mirtazapine 30 MG tablet Commonly known as: REMERON Take 1 tablet (30 mg total) by mouth at bedtime. What changed:  medication strength how much to take  Indication: Major Depressive Disorder   multivitamin with minerals Tabs tablet Take 1 tablet by mouth daily. Start taking on: December 18, 2021    omeprazole 20 MG capsule Commonly known as: PRILOSEC Take 20 mg by mouth daily.    OXYGEN Inhale 2 L into the lungs continuous.    risperiDONE 0.25 MG tablet Commonly known as: RISPERDAL Take 1 tablet (0.25 mg total) by mouth 2 (two) times daily at 8 am and 4 pm.  Indication: Major Depressive Disorder   Vitamin D3 50 MCG (2000 UT) capsule Take 2,000 Units by mouth daily after lunch.         [place] Follow up.   Why: Once admitted, staff will make a referral for their in house provider to complete psychiatric services. Contact information: [address]  [place] [address] [phone]                Follow-up recommendations:  As above    Signed: Parks Ranger, DO 12/17/2021, 10:58 AM

## 2021-12-17 NOTE — Progress Notes (Signed)
Patient remains depressed and anxious. She refused stool softer stating she may take it in the morning. She remains on 1:1 with sitter at the bedside.

## 2021-12-17 NOTE — BHH Suicide Risk Assessment (Signed)
[facility] Discharge Suicide Risk Assessment   Principal Problem: Severe recurrent major depression without psychotic features [facility]) Discharge Diagnoses: Principal Problem:   Severe recurrent major depression without psychotic features ([place]) Active Problems:   Hepatocellular carcinoma (HCC)   Chronic pain   Essential hypertension   Major depressive disorder, recurrent severe without psychotic features ([place])   Total Time spent with patient: 1 hour  Musculoskeletal: Strength & Muscle Tone: within normal limits Gait & Station: unsteady Patient leans: N/A  Psychiatric Specialty Exam  Presentation  General Appearance:  Casual  Eye Contact: Fair  Speech: Clear and Coherent  Speech Volume: Normal  Handedness:No data recorded  Mood and Affect  Mood: Euthymic  Duration of Depression Symptoms: Greater than two weeks  Affect: Blunt   Thought Process  Thought Processes: Goal Directed  Descriptions of Associations:Tangential  Orientation:Full (Time, Place and Person)  Thought Content:Logical  History of Schizophrenia/Schizoaffective disorder:No  Duration of Psychotic Symptoms:No data recorded Hallucinations:No data recorded Ideas of Reference:None  Suicidal Thoughts:No data recorded Homicidal Thoughts:No data recorded  Sensorium  Memory: Recent Good; Immediate Fair; Remote Fair  Judgment: Poor  Insight: Shallow   Executive Functions  Concentration: Fair  Attention Span: Fair  Recall: [employer] of Knowledge: Fair  Language: Fair   Psychomotor Activity  Psychomotor Activity:No data recorded  Assets  Assets: Social Support; Resilience; Physical Health; Financial Resources/Insurance   Sleep  Sleep:No data recorded  Physical Exam: Physical Exam ROS Blood pressure 112/72, pulse (!) 109, temperature (!) 96.3 F (35.7 C), resp. rate 18, height 5' (1.524 m), weight 57 kg, SpO2 99 %. Body mass index is 24.54 kg/m.  Mental Status Per  Nursing Assessment::   On Admission:  NA  Demographic Factors:  Age 42 or older and Caucasian  Loss Factors: Decline in physical health  Historical Factors: Impulsivity  Risk Reduction Factors:   Living with another person, especially a relative, Positive social support, and Positive therapeutic relationship  Continued Clinical Symptoms:  Depression:   Anhedonia Impulsivity Medical Diagnoses and Treatments/Surgeries  Cognitive Features That Contribute To Risk:  None    Suicide Risk:  Minimal: No identifiable suicidal ideation.  Patients presenting with no risk factors but with morbid ruminations; may be classified as minimal risk based on the severity of the depressive symptoms   Follow-up [place] [facility] Follow up.   Why: Once admitted, staff will make a referral for their in house provider to complete psychiatric services. Contact information: [address]  [place] [address] [phone]                Plan Of Care/Follow-up recommendations: [facility], [place] 12/17/2021, 10:33 AM

## 2021-12-17 NOTE — Progress Notes (Signed)
Patient ID: Tina Bailey, female   DOB: Sep 21, 1945, 76 y.o.   MRN: [id number]  Patient discharged from [facility] unit at approx 1255 escorted by staff. Patient has portable oxygen tank @ 2L while being escorted. Patient belongings including walker, back brace, and eyeglasses were sent with patient. Discharge packet reviewed with daughter [name] via telephone. Patient denies SI/HI/AVH. No distress noted.

## 2021-12-22 ENCOUNTER — Telehealth: Payer: Self-pay | Admitting: Nurse Practitioner

## 2021-12-22 NOTE — Telephone Encounter (Signed)
Per 10/4 phone line pt called and said she has moved out of state.  Candled appointment

## 2021-12-23 ENCOUNTER — Ambulatory Visit (HOSPITAL_COMMUNITY): Payer: Medicare HMO

## 2021-12-23 ENCOUNTER — Inpatient Hospital Stay: Payer: Medicare HMO

## 2021-12-28 ENCOUNTER — Inpatient Hospital Stay: Payer: Medicare HMO | Admitting: Nurse Practitioner

## 2022-02-22 ENCOUNTER — Ambulatory Visit: Payer: Medicare HMO | Admitting: Internal Medicine

## 2022-07-20 DEATH — deceased

## 2022-10-20 DEATH — deceased
